# Patient Record
Sex: Female | Born: 1980 | Race: White | Hispanic: No | Marital: Married | Smoking: Former smoker
Health system: Southern US, Community
[De-identification: ages and names within clinical notes are randomized; demographics above are authoritative.]

## PROBLEM LIST (undated history)

## (undated) DIAGNOSIS — R569 Unspecified convulsions: Secondary | ICD-10-CM

## (undated) DIAGNOSIS — D352 Benign neoplasm of pituitary gland: Secondary | ICD-10-CM

## (undated) DIAGNOSIS — Z8669 Personal history of other diseases of the nervous system and sense organs: Secondary | ICD-10-CM

## (undated) DIAGNOSIS — G43909 Migraine, unspecified, not intractable, without status migrainosus: Secondary | ICD-10-CM

## (undated) DIAGNOSIS — K219 Gastro-esophageal reflux disease without esophagitis: Secondary | ICD-10-CM

## (undated) DIAGNOSIS — Z8619 Personal history of other infectious and parasitic diseases: Secondary | ICD-10-CM

## (undated) DIAGNOSIS — J45909 Unspecified asthma, uncomplicated: Secondary | ICD-10-CM

## (undated) DIAGNOSIS — E119 Type 2 diabetes mellitus without complications: Secondary | ICD-10-CM

## (undated) DIAGNOSIS — G90A Postural orthostatic tachycardia syndrome (POTS): Secondary | ICD-10-CM

## (undated) DIAGNOSIS — F32A Depression, unspecified: Secondary | ICD-10-CM

## (undated) DIAGNOSIS — F329 Major depressive disorder, single episode, unspecified: Secondary | ICD-10-CM

## (undated) DIAGNOSIS — F419 Anxiety disorder, unspecified: Secondary | ICD-10-CM

## (undated) HISTORY — DX: Major depressive disorder, single episode, unspecified: F32.9

## (undated) HISTORY — DX: Benign neoplasm of pituitary gland: D35.2

## (undated) HISTORY — PX: OTHER SURGICAL HISTORY: SHX169

## (undated) HISTORY — DX: Personal history of other infectious and parasitic diseases: Z86.19

## (undated) HISTORY — DX: Depression, unspecified: F32.A

## (undated) HISTORY — DX: Type 2 diabetes mellitus without complications: E11.9

## (undated) HISTORY — DX: Anxiety disorder, unspecified: F41.9

## (undated) HISTORY — DX: Unspecified convulsions: R56.9

## (undated) HISTORY — DX: Gastro-esophageal reflux disease without esophagitis: K21.9

## (undated) HISTORY — DX: Unspecified asthma, uncomplicated: J45.909

## (undated) HISTORY — PX: APPENDECTOMY: SHX54

## (undated) HISTORY — DX: Personal history of other diseases of the nervous system and sense organs: Z86.69

## (undated) HISTORY — PX: CHOLECYSTECTOMY: SHX55

---

## 2014-05-20 LAB — HM PAP SMEAR: HM Pap smear: NORMAL

## 2016-01-27 ENCOUNTER — Telehealth: Payer: Self-pay

## 2016-01-27 NOTE — Telephone Encounter (Signed)
Pre visit call completed 

## 2016-01-28 ENCOUNTER — Encounter: Payer: Self-pay | Admitting: Family Medicine

## 2016-01-28 ENCOUNTER — Ambulatory Visit (INDEPENDENT_AMBULATORY_CARE_PROVIDER_SITE_OTHER): Admitting: Family Medicine

## 2016-01-28 VITALS — BP 102/74 | HR 90 | Temp 97.9°F | Ht 67.0 in | Wt 257.6 lb

## 2016-01-28 DIAGNOSIS — G43109 Migraine with aura, not intractable, without status migrainosus: Secondary | ICD-10-CM

## 2016-01-28 DIAGNOSIS — Z86018 Personal history of other benign neoplasm: Secondary | ICD-10-CM

## 2016-01-28 LAB — COMPREHENSIVE METABOLIC PANEL
ALT: 26 U/L (ref 0–35)
AST: 22 U/L (ref 0–37)
Albumin: 4.4 g/dL (ref 3.5–5.2)
Alkaline Phosphatase: 58 U/L (ref 39–117)
BUN: 11 mg/dL (ref 6–23)
CALCIUM: 9.8 mg/dL (ref 8.4–10.5)
CHLORIDE: 102 meq/L (ref 96–112)
CO2: 28 meq/L (ref 19–32)
Creatinine, Ser: 0.81 mg/dL (ref 0.40–1.20)
GFR: 85.02 mL/min (ref >60.00)
Glucose, Bld: 82 mg/dL (ref 70–99)
Potassium: 4 mEq/L (ref 3.5–5.1)
Sodium: 138 mEq/L (ref 135–145)
Total Bilirubin: 0.5 mg/dL (ref 0.2–1.2)
Total Protein: 7.6 g/dL (ref 6.0–8.3)

## 2016-01-28 LAB — LIPID PANEL
Cholesterol: 213 mg/dL — ABNORMAL HIGH (ref 0–200)
HDL: 31 mg/dL — AB (ref >39.00)
Total CHOL/HDL Ratio: 7

## 2016-01-28 LAB — LDL CHOLESTEROL, DIRECT: LDL DIRECT: 93 mg/dL

## 2016-01-28 NOTE — Patient Instructions (Signed)
Take magnesium every day for your headaches. You can use 400-500 mg daily. This is available over the counter.

## 2016-01-28 NOTE — Progress Notes (Signed)
Chief Complaint  Patient presents with  . 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  The patient was previously seen at an office in [place].   Migraines-  On L side, worse over the past 6 mo. Aura of numbness/tingling on L side of face and pain behind eye. She is sensitive to light and sound. She used to be on Topamax. She cannot take any triptans due to parental adverse effects and cannot use NSAIDs due to side effects. She has never tried/failed any other prophylactic medications. Her HA's last for 2-3 days and she has around 10-12 per month.  Hx of prolactinoma, removed 8 years ago, and elevated prolactin levels. She used to follow with Endocrinology in office she used to be at in [place]. Would like to see one here. She used to be on Cabergoline.   Allergies  Allergen Reactions  . Aspirin   . Ibuprofen   . Iodine   . Nsaids Nausea And Vomiting  . Sulfa Antibiotics   . Tape Rash    Blisters     Past Medical History:  Diagnosis Date  . Asthma   . Depression   . GERD (gastroesophageal reflux disease)   . History of chicken pox   . History of migraine headaches   . Seizures ([place])    Past Surgical History:  Procedure Laterality Date  . APPENDECTOMY    . CHOLECYSTECTOMY    . Pituiary Tumor     Tumor Removal Pituitary   Social History   Social History  . Marital status: Married   Social History Main Topics  . Smoking status: Former Research scientist (life sciences)  . Smokeless tobacco: Never Used  . Alcohol use No  . Drug use: No   Family History  Problem Relation Age of Onset  . Diabetes Mother   . Migraines Mother   . Hyperlipidemia Mother   . Bipolar disorder Mother   . Diabetes Father   . Colon cancer Father   . Cancer Father     colon  . Hyperlipidemia  Father   . Hypertension Father    Takes no medications routinely.  Patient's last menstrual period was 01/28/2016.  ROS Neuro: +Hx of migraines  Eyes: Denies vision changes   OBJECTIVE: BP 102/74 (BP Location: Right Arm, Patient Position: Sitting, Cuff Size: Large)   Pulse 90   Temp 97.9 F (36.6 C) (Oral)   Ht 5\' 7"  (1.702 m)   Wt 257 lb 9.6 oz (116.8 kg)   LMP 01/28/2016   SpO2 96%   BMI 40.35 kg/m   Constitutional: -  VS reviewed -  Well developed, well nourished, appears stated age -  No apparent distress  Psychiatric: -  Oriented to person, place, and time -  Memory intact -  Affect and mood normal -  Fluent conversation, good eye contact -  Judgment and insight age appropriate  Eye: -  Conjunctivae clear, no discharge -  Pupils symmetric, round, reactive to light  ENMT: -  Ears are patent b/l without erythema or discharge. TM's are shiny and clear b/l without evidence of effusion or infection. -  Oral mucosa without lesions, tongue and uvula midline    Tonsils not enlarged, no erythema, no exudate, trachea midline    Pharynx moist, no lesions,  no erythema  Neck: -  No gross swelling, no palpable masses -  Thyroid midline, not enlarged, mobile, no palpable masses  Cardiovascular: -  RRR, no murmurs -  No LE edema  Respiratory: -  Normal respiratory effort, no accessory muscle use, no retraction -  Breath sounds equal, no wheezes, no ronchi, no crackles  Neurological:  -  CN II - XII grossly intact -  Biceps reflex 1/4 bl -  Patellar reflex 3/4 bl -  Calcaneal reflex 1/4 b/l -  No clonus -  Sensation grossly intact to light touch, equal bilaterally  Musculoskeletal: -  No clubbing, no cyanosis -  Gait normal  Skin: -  No significant lesion on inspection -  Warm and dry to palpation   ASSESSMENT/PLAN: Migraine with aura and without status migrainosus, not intractable - Plan: Ambulatory referral to Endocrinology  History of prolactinoma - Plan:  Prolactin  Morbid obesity ([place]) - Plan: Comprehensive metabolic panel, Lipid panel  Patient instructed to sign release of records form from her previous PCP. Other orders as above. Will refer to Endo. Will start Mg in the patient 400-500 mg daily as she is interested in having another child. Patient should return 4 weeks to recheck HA's- propranolol is another alternative to increasing dose of magnesium. The patient voiced understanding and agreement to the plan.   [place], DO 01/28/16  2:11 PM

## 2016-01-28 NOTE — Progress Notes (Signed)
Pre visit review using our clinic review tool, if applicable. No additional management support is needed unless otherwise documented below in the visit note. 

## 2016-01-29 ENCOUNTER — Other Ambulatory Visit: Payer: Self-pay | Admitting: Family Medicine

## 2016-01-29 DIAGNOSIS — E781 Pure hyperglyceridemia: Secondary | ICD-10-CM

## 2016-01-29 LAB — PROLACTIN: PROLACTIN: 114.7 ng/mL — AB

## 2016-02-08 ENCOUNTER — Other Ambulatory Visit

## 2016-02-28 NOTE — Progress Notes (Addendum)
Subjective:    Patient ID: Amber [name], female    DOB: [birth year], 35 y.o.   MRN: [id number]  HPI Pt is referred by Dr Nani Ravens, for hyperprolactinemia.  Pt says this was dx'ed in 1996, in eval of primary amenorrhea.  She had resection of prolactinoma in 2009, in [place], [place].  She says prolactin level normalized, and menses resumed.  The hyperprolactinemia recurred in 2015, so she took cabergoline for a few months.  She says last MRI (prior to cabergoline) in 2015 showed no tumor.  She stopped cabergoline, due to insurance. She is G0, but would like to start a pregnancy.  She took oral contraceptives x 1 year, 2010-2011. She denies the following: excessive exercise, opiates, antipsychotics, brain XRT, cirrhosis,  thyroid dz, seizures, h/o liver dz, PCO, hirsutism, renal dz, zoster, brain injury, or chest wall injury.  She has intermittent severe headache, worse x 9 months, at the left frontal area, and assoc n/v. She did not tolerate parlodel (N/V).   Past Medical History:  Diagnosis Date  . Asthma   . Depression   . GERD (gastroesophageal reflux disease)   . History of chicken pox   . History of migraine headaches   . Seizures ([place])     Past Surgical History:  Procedure Laterality Date  . APPENDECTOMY    . CHOLECYSTECTOMY    . Pituiary Tumor     Tumor Removal Pituitary    Social History   Social History  . Marital status: Married    Spouse name: N/A  . Number of children: N/A  . Years of education: N/A   Occupational History  . Not on file.   Social History Main Topics  . Smoking status: Former Research scientist (life sciences)  . Smokeless tobacco: Never Used  . Alcohol use No  . Drug use: No  . Sexual activity: Not on file   Other Topics Concern  . Not on file   Social History Narrative  . No narrative on file    Current Outpatient Prescriptions on File Prior to Visit  Medication Sig Dispense Refill  . Magnesium 400 MG TABS Take 400 mg by mouth daily.     No current  facility-administered medications on file prior to visit.     Allergies  Allergen Reactions  . Aspirin   . Ibuprofen   . Iodine   . Nsaids Nausea And Vomiting  . Sulfa Antibiotics   . Tape Rash    Blisters     Family History  Problem Relation Age of Onset  . Diabetes Mother   . Migraines Mother   . Hyperlipidemia Mother   . Bipolar disorder Mother   . Diabetes Father   . Colon cancer Father   . Cancer Father     colon  . Hyperlipidemia Father   . Hypertension Father   . Other Neg Hx     pituitary disorder    BP 124/86   Pulse 100   Ht 5\' 7"  (1.702 m)   Wt 263 lb (119.3 kg)   SpO2 95%   BMI 41.19 kg/m    Review of Systems denies polyuria, loss of smell, syncope, rash, visual loss, easy bruising, and change in facial appearance.  She has galactorrhea, weight gain, rhinorrhea, and depression. She has reg menses x the past 4 months.      Objective:   Physical Exam VS: see vs page GEN: no distress HEAD: head: no deformity eyes: no periorbital swelling, no proptosis external nose and ears are normal  mouth: no lesion seen NECK: supple, thyroid is not enlarged CHEST WALL: no deformity LUNGS: clear to auscultation CV: reg rate and rhythm, no murmur ABD: abdomen is soft, nontender.  no hepatosplenomegaly.  not distended.  no hernia MUSCULOSKELETAL: muscle bulk and strength are grossly normal.  no obvious joint swelling.  gait is normal and steady EXTEMITIES: no edema PULSES: no carotid bruit NEURO:  cn 2-12 grossly intact.   readily moves all 4's.  sensation is intact to touch on all 4's.  SKIN:  Normal texture and temperature.  No rash or suspicious lesion is visible.   NODES:  None palpable at the neck PSYCH: alert, well-oriented.  Does not appear anxious nor depressed.  Prolactin=115 Lab Results  Component Value Date   TSH 0.66 03/02/2016      Assessment & Plan:  Hyperprolactinemia, recurrent.   Headache, ? related to the above.   Patient is advised  the following: Patient Instructions  blood tests are requested for you today.  We'll let you know about the results.  Based on the results, we'll likely recheck the MRI.  Then most likely, we'll resume the carbergoline, and you'll be ready to try for the pregnancy.   Please sign a release of info from [facility].

## 2016-03-02 ENCOUNTER — Encounter: Payer: Self-pay | Admitting: Endocrinology

## 2016-03-02 ENCOUNTER — Ambulatory Visit (INDEPENDENT_AMBULATORY_CARE_PROVIDER_SITE_OTHER): Admitting: Endocrinology

## 2016-03-02 DIAGNOSIS — E221 Hyperprolactinemia: Secondary | ICD-10-CM | POA: Diagnosis not present

## 2016-03-02 DIAGNOSIS — D352 Benign neoplasm of pituitary gland: Secondary | ICD-10-CM | POA: Diagnosis not present

## 2016-03-02 LAB — T4, FREE: FREE T4: 0.7 ng/dL (ref 0.60–1.60)

## 2016-03-02 LAB — BASIC METABOLIC PANEL
BUN: 10 mg/dL (ref 6–23)
CHLORIDE: 104 meq/L (ref 96–112)
CO2: 27 mEq/L (ref 19–32)
Calcium: 9.4 mg/dL (ref 8.4–10.5)
Creatinine, Ser: 0.74 mg/dL (ref 0.40–1.20)
GFR: 94.32 mL/min (ref >60.00)
Glucose, Bld: 105 mg/dL — ABNORMAL HIGH (ref 70–99)
POTASSIUM: 3.8 meq/L (ref 3.5–5.1)
SODIUM: 138 meq/L (ref 135–145)

## 2016-03-02 LAB — TSH: TSH: 0.66 u[IU]/mL (ref 0.35–4.50)

## 2016-03-02 NOTE — Patient Instructions (Addendum)
blood tests are requested for you today.  We'll let you know about the results.  Based on the results, we'll likely recheck the MRI.  Then most likely, we'll resume the carbergoline, and you'll be ready to try for the pregnancy.   Please sign a release of info from [place].

## 2016-03-03 ENCOUNTER — Telehealth: Payer: Self-pay

## 2016-03-03 NOTE — Telephone Encounter (Signed)
-----   Message from Renato Shin, MD sent at 03/02/2016  6:42 PM EST ----- please call patient: Normal. I ordered MRI.  you will receive a phone call, about a day and time for an appointment

## 2016-03-03 NOTE — Telephone Encounter (Signed)
Attempted to reach the patient. Patient was not available and did not have a working voicemail. Will try again at a later time.  

## 2016-03-05 DIAGNOSIS — D352 Benign neoplasm of pituitary gland: Secondary | ICD-10-CM | POA: Insufficient documentation

## 2016-03-22 ENCOUNTER — Other Ambulatory Visit

## 2016-03-30 ENCOUNTER — Other Ambulatory Visit

## 2016-05-09 ENCOUNTER — Telehealth: Payer: Self-pay | Admitting: Family Medicine

## 2016-05-09 NOTE — Telephone Encounter (Addendum)
Called patient to follow up. Pt states that symptoms started on 04/30/16.  Started with a clustered headache that did not go away after taking medication.  Instead, pt started to experience numbness and tingling to the left side of her face and pain on the left side of her body (left knee and hip).  She also feels really tired and has chest pain.  Pt states she does not want to go to the ER.  States she's not dying and would prefer to wait to see her provider.    Discussed the same with Dr. Nani Ravens.  He advised that patient go to the ER, but if she choose to wait a day or two that would be fine.    Pt scheduled to see Dr. Nani Ravens on Wednesday at 2:15pm.  Pt was strongly advised if symptoms worsen or new symptoms developed to go to the ER.  Pt stated understanding and agreed to comply.

## 2016-05-09 NOTE — Telephone Encounter (Signed)
[facility] Day - Client [facility] Patient Name: Amber [name] DOB: June 14, 1980 Initial Comment Caller states that she has been getting bad migraine and numbness, and tingling in her left side of her face and pain on her left side as well and she feels heavy, and is extremely lethargic. She has pain in her left side of her body. She is extremely weak as well. Nurse Assessment Nurse: Markus Daft, RN, Windy Date/Time Eilene Ghazi Time): 05/09/2016 2:37:28 PM Confirm and document reason for call. If symptomatic, describe symptoms. ---Caller states that she has been getting severe migraine and numbness, and tingling in her left side of her face and pain on her left side as well and she feels heavy, and is extremely tired doing simple tasks, and weakness all over. She has pain in her left side of her body. Does the patient have any new or worsening symptoms? ---Yes Will a triage be completed? ---Yes Related visit to physician within the last 2 weeks? ---No Does the PT have any chronic conditions? (i.e. diabetes, asthma, etc.) ---Yes List chronic conditions. ---migraines, pituitary tumor - needs MRI to see if has reoccurred Is the patient pregnant or possibly pregnant? (Ask all females between the ages of 20-55) ---No Is this a behavioral health or substance abuse call? ---No Guidelines Guideline Title Affirmed Question Affirmed Notes Headache [1] SEVERE headache (e.g., excruciating) AND [2] not improved after 2 hours of pain medicine Final Disposition User See Physician within 4 Hours (or PCP triage) Markus Daft, RN, Windy Comments Numbness in her face for last week. No available appts for when pt can get to office today. She plans to go to the ER in same buidling. Referrals REFERRED TO PCP OFFICE [facility] - ED Disagree/Comply: Comply

## 2016-05-11 ENCOUNTER — Encounter: Payer: Self-pay | Admitting: Family Medicine

## 2016-05-11 ENCOUNTER — Ambulatory Visit (INDEPENDENT_AMBULATORY_CARE_PROVIDER_SITE_OTHER): Admitting: Family Medicine

## 2016-05-11 DIAGNOSIS — G43909 Migraine, unspecified, not intractable, without status migrainosus: Secondary | ICD-10-CM | POA: Insufficient documentation

## 2016-05-11 DIAGNOSIS — G43809 Other migraine, not intractable, without status migrainosus: Secondary | ICD-10-CM

## 2016-05-11 MED ORDER — PROPRANOLOL HCL 40 MG PO TABS: 40.0000 mg | ORAL_TABLET | Freq: Two times a day (BID) | ORAL | 1 refills | Status: DC

## 2016-05-11 NOTE — Patient Instructions (Addendum)
Aim to do some physical exertion for 150 minutes per week. This is typically divided into 5 days per week, 30 minutes per day. The activity should be enough to get your heart rate up. Anything is better than nothing if you have time constraints.  Keep an eye on your diet and see if it affects your headaches.  Stay on the magnesium for now.  After 4 weeks, you can start taking 3 tabs of the propranolol daily, increased from twice daily.

## 2016-05-11 NOTE — Progress Notes (Signed)
Pre visit review using our clinic review tool, if applicable. No additional management support is needed unless otherwise documented below in the visit note. 

## 2016-05-11 NOTE — Progress Notes (Signed)
Chief Complaint  Patient presents with  . Numbness    Pt reports LT sided body pain with low back pain as well with numbness and tingling x2 weeks     Amber [name] is a 36 y.o. female here for evaluation of headache.  Duration: 11 days Palliation: Nothing Provocation: Doing things Severity: 8 Quality: pressure Associated symptoms: sensitivity to light, intermittent nausea, left sided weakness and pain Denies: vomiting, diplopia, vertigo, tinnitus, ataxia Currently with headache? Yes Failed therapies: Has been on Mg 400 mg daily, no benefit Unable to take triptans due to familial adverse effect. She cannot take NSAIDs due to hx of GI bleed on them.  Past Medical History:  Diagnosis Date  . Asthma   . Depression   . GERD (gastroesophageal reflux disease)   . History of chicken pox   . History of migraine headaches   . Seizures ([place])    Family History  Problem Relation Age of Onset  . Diabetes Mother   . Migraines Mother   . Hyperlipidemia Mother   . Bipolar disorder Mother   . Diabetes Father   . Colon cancer Father   . Cancer Father     colon  . Hyperlipidemia Father   . Hypertension Father   . Other Neg Hx     pituitary disorder   Current Meds  Medication Sig  . Magnesium 400 MG TABS Take 400 mg by mouth daily.    BP 113/86 (BP Location: Left Arm, Patient Position: Sitting, Cuff Size: Large)   Pulse (!) 106   Temp 98.3 F (36.8 C) (Oral)   Ht 5\' 6"  (1.676 m)   Wt 269 lb 12.8 oz (122.4 kg)   LMP 03/21/2016   SpO2 97%   BMI 43.55 kg/m  General: awake, alert, appearing stated age Eyes: PERRLA, EOMi Heart: Reg rhythm, tachycardic, no murmurs, no bruits Lungs: CTAB, no accessory muscle use Neuro: CN 2-12 intact, no cerebellar signs, DTR's equal and symmetry, no clonus MSK: 5/5 strength throughout UE's b/l, normal gait, 4/5 strength L hip flexion and knee extension/flexion, 5/5 strength in RLE Psych: Age appropriate judgment and insight, mood and affect  normal  Other migraine without status migrainosus, not intractable - Plan: propranolol (INDERAL) 40 MG tablet  Orders as above. Start propranolol.  Symptoms have been going on >24 hours. MRI ordered. This is likely due to functional neurologic deficit, but am interested to see what MRI shows. Counseled on diet and exercise. Stay on Mg. Follow up in 6-8 weeks. If no improvement, will increase dose and refer to Neurology for 2nd opinion. The patient voiced understanding and agreement to the plan.  [place], DO 2:42 PM 05/11/16

## 2016-05-18 ENCOUNTER — Telehealth: Payer: Self-pay | Admitting: Family Medicine

## 2016-05-18 DIAGNOSIS — G43809 Other migraine, not intractable, without status migrainosus: Secondary | ICD-10-CM

## 2016-05-18 NOTE — Telephone Encounter (Signed)
Caller name: Relationship to patient: Self Can be reached: [phone]  Pharmacy:  [id number] [place], [place] [address], [address] [phone] (Phone) [phone] (Fax)     Reason for call: Patient states that Rx for propranolol (INDERAL) 40 MG tablet was called into the wrong pharmacy. [facility]

## 2016-05-23 ENCOUNTER — Encounter: Payer: Self-pay | Admitting: Family Medicine

## 2016-05-24 MED ORDER — PROPRANOLOL HCL 40 MG PO TABS: 40.0000 mg | ORAL_TABLET | Freq: Two times a day (BID) | ORAL | 1 refills | Status: DC

## 2016-05-24 NOTE — Addendum Note (Signed)
Addended by: Sharon Seller B on: 05/24/2016 09:56 AM   Modules accepted: Orders

## 2016-05-24 NOTE — Telephone Encounter (Signed)
Refill corrected. Attempted to contact the patient no answer/no voice mail set up.

## 2016-05-25 ENCOUNTER — Ambulatory Visit
Admission: RE | Admit: 2016-05-25 | Discharge: 2016-05-25 | Disposition: A | Discharge status: Home / Self Care | Source: Ambulatory Visit | Attending: Endocrinology | Admitting: Endocrinology

## 2016-05-25 DIAGNOSIS — E221 Hyperprolactinemia: Secondary | ICD-10-CM

## 2016-05-25 MED ORDER — GADOBENATE DIMEGLUMINE 529 MG/ML IV SOLN
10.0000 mL | Freq: Once | INTRAVENOUS | Status: AC | PRN
  Administered 2016-05-25: 10 mL via INTRAVENOUS

## 2016-05-26 ENCOUNTER — Telehealth: Payer: Self-pay | Admitting: Family Medicine

## 2016-05-26 ENCOUNTER — Telehealth: Payer: Self-pay | Admitting: Endocrinology

## 2016-05-26 ENCOUNTER — Encounter: Payer: Self-pay | Admitting: Endocrinology

## 2016-05-26 ENCOUNTER — Encounter: Payer: Self-pay | Admitting: Family Medicine

## 2016-05-26 ENCOUNTER — Ambulatory Visit (INDEPENDENT_AMBULATORY_CARE_PROVIDER_SITE_OTHER): Admitting: Family Medicine

## 2016-05-26 VITALS — BP 100/72 | HR 76 | Temp 97.5°F | Ht 66.0 in | Wt 270.0 lb

## 2016-05-26 DIAGNOSIS — G43809 Other migraine, not intractable, without status migrainosus: Secondary | ICD-10-CM

## 2016-05-26 MED ORDER — ONDANSETRON HCL 4 MG PO TABS: 4.0000 mg | ORAL_TABLET | Freq: Three times a day (TID) | ORAL | 0 refills | Status: DC | PRN

## 2016-05-26 MED ORDER — DIHYDROERGOTAMINE MESYLATE 4 MG/ML NA SOLN: 1.0000 | NASAL | 12 refills | Status: DC | PRN

## 2016-05-26 MED ORDER — CABERGOLINE 0.5 MG PO TABS: 0.2500 mg | ORAL_TABLET | ORAL | 2 refills | Status: DC

## 2016-05-26 NOTE — Telephone Encounter (Signed)
Relation to pt: self  Call back [phone] Pharmacy: [id number] [place], [place] [address], [address] AT [facility] [address] [phone] (Phone) [phone] (Fax)     Reason for call:  Patient states dihydroergotamine (MIGRANAL) 4 MG/ML nasal spray is $700 requesting alternate or pain medication, please advise

## 2016-05-26 NOTE — Patient Instructions (Addendum)
Do not get pregnant while taking the nasal spray medicine. Can take every 15 min, up to 4 times in 1 hour.  The biggest side effect is nausea. Anti-nausea medicine has been called in just in case.

## 2016-05-26 NOTE — Telephone Encounter (Signed)
I contacted the patient and advised Rx has been submitted for the cabergoline. Patient voiced understanding.

## 2016-05-26 NOTE — Progress Notes (Signed)
Chief Complaint  Patient presents with  . Migraine    x 4 weeks    Amber [name] is a 36 y.o. female here for evaluation of headache.  Duration: 4 weeks Unable to take NSAIDs due to hx of GI bleeds.  She has a strong fam hx of intolerance to triptans and does not wish to take these.  She has never been on an ergotamine before. R sided headache, some intermittent nausea, sensitivity to light is improved.  She was recommended to take Tylenol and start on propranolol. States propranolol may be slightly helpful, but she needs something to break the headache.  No new symptoms such as vision changes, weakness, balance issues, trouble swallowing.  Past Medical History:  Diagnosis Date  . Asthma   . Depression   . GERD (gastroesophageal reflux disease)   . History of chicken pox   . History of migraine headaches   . Seizures ([place])    Family History  Problem Relation Age of Onset  . Diabetes Mother   . Migraines Mother   . Hyperlipidemia Mother   . Bipolar disorder Mother   . Diabetes Father   . Colon cancer Father   . Cancer Father     colon  . Hyperlipidemia Father   . Hypertension Father   . Other Neg Hx     pituitary disorder   Current Meds  Medication Sig  . acetaminophen (TYLENOL) 500 MG tablet Take 500 mg by mouth as needed.  . butalbital-acetaminophen-caffeine (FIORICET, ESGIC) 50-325-40 MG tablet Take 1 tablet by mouth every 4 (four) hours as needed. for pain  . diphenhydrAMINE (BENADRYL) 50 MG tablet Take 50 mg by mouth at bedtime as needed for itching.  . Magnesium 400 MG TABS Take 400 mg by mouth daily.  . propranolol (INDERAL) 40 MG tablet Take 1 tablet (40 mg total) by mouth 3 (three) times daily.  . [DISCONTINUED] propranolol (INDERAL) 40 MG tablet Take 1 tablet (40 mg total) by mouth 2 (two) times daily. (Patient taking differently: Take 40 mg by mouth 3 (three) times daily. )    BP 100/72 (BP Location: Left Arm, Patient Position: Sitting, Cuff Size:  Large)   Pulse 76   Temp 97.5 F (36.4 C) (Oral)   Ht 5\' 6"  (1.676 m)   Wt 270 lb (122.5 kg)   LMP 03/21/2016   SpO2 95%   BMI 43.58 kg/m  General: awake, alert, appearing stated age Eyes: PERRLA, EOMi Heart: RRR, no murmurs, no bruits Lungs: CTAB, no accessory muscle use Neuro: CN 2-12 intact, no cerebellar signs, DTR's equal and symmetry, no clonus MSK: 5/5 strength throughout, normal gait, no TTP over posterior cervical triangle or paraspinal cervical musculature, no TMJ TTP or clicking Psych: Age appropriate judgment and insight, mood and affect normal  Other migraine without status migrainosus, not intractable - Plan: dihydroergotamine (MIGRANAL) 4 MG/ML nasal spray, ondansetron (ZOFRAN) 4 MG tablet, Ambulatory referral to Neurology, propranolol (INDERAL) 40 MG tablet  Orders as above. Does not sound like IIH.  OK to increase propranolol to TID from BID. Will try Migranal as she is intolerant to triptans and NSAIDs. Will also refer to Neuro for 2nd opinion. Encouraged not to get pregnant on nasal spray. Zofran called in should she start experiencing nausea. Reassurance that MRI did not show anything sinister other than pituitary issue being followed by Endo. Follow up prn. The patient voiced understanding and agreement to the plan.  Crosby Oyster [place], DO 2:36 PM 05/26/16

## 2016-05-26 NOTE — Telephone Encounter (Signed)
I have sent a prescription to your pharmacy. Please come back for a follow-up appointment in 6 weeks

## 2016-05-26 NOTE — Telephone Encounter (Signed)
Pt called in and said that she would like to start the medication that Dr. Loanne Drilling asked her about through [facility].  She wants to make [facility] [address]

## 2016-05-26 NOTE — Progress Notes (Signed)
Pre visit review using our clinic review tool, if applicable. No additional management support is needed unless otherwise documented below in the visit note. 

## 2016-05-27 ENCOUNTER — Encounter: Payer: Self-pay | Admitting: Family Medicine

## 2016-05-27 MED ORDER — METOCLOPRAMIDE HCL 10 MG PO TABS: ORAL_TABLET | ORAL | 0 refills | Status: DC

## 2016-05-27 NOTE — Telephone Encounter (Signed)
OK, that's pretty expensive, taken off of med list. No need for Zofran either. Let's try some Reglan. Called in. Take 1 tab, repeat in 1 hour if no improvement. Do not take more than 2 tabs in 1 day. TY.

## 2016-05-30 MED ORDER — DEXAMETHASONE 4 MG PO TABS: ORAL_TABLET | ORAL | 0 refills | Status: DC

## 2016-05-30 NOTE — Telephone Encounter (Signed)
Called pharmacy on (05/27/16) and spoke with Joe and informed him that Dr. Nani Ravens would like to try Reglan for the pt.  Per [facility] has inaction with the Cabergoline.  Informed him that I will inform Dr. Nani Ravens of this.//AB/CMA

## 2016-05-30 NOTE — Telephone Encounter (Signed)
Called and spoke with Joe at the pharmacy and informed him that Dr. Nani Ravens would like to try Dexamethasone 4mg  daily for 3 days.  Prescription was given verbally to Joe at the pharmacy. Pharmacy will contact the pt.//AB/CMA

## 2016-05-30 NOTE — Telephone Encounter (Signed)
Called and [facility] @ 5:32pm @ [phone]) informing the pt of update regarding change in medication.//AB/CMA

## 2016-06-02 ENCOUNTER — Telehealth: Payer: Self-pay | Admitting: Family Medicine

## 2016-06-02 NOTE — Telephone Encounter (Signed)
Called pt back- she increased her propranolol from BID to TID about one week ago.  She did ok for the first few days but over the last 2-3 days she has started to feel lightheaded and tired.  She checked her BP a few times today and it was running 80s/50s.   Her normal BP is on the low side normally at approx 105/70.  She has been drinking water and eating more salty foods, and is feeling better.  Advised her that she should stop the propranolol now and I will ask Dr. Nani Ravens to give her a call tomorrow.  Also explained to her that Dr. Nani Ravens did not get her message from team care and was not aware that she called, which is why he did not call her back earlier today.   Also advised her that the safest and preferred plan of care would be to have her go to the ER so they can monitor and raise her BP with IV fluids as needed.  Advised her that if she chooses not to do this I would encourage her to drink plenty of fluids including some sports drink, and to monitor her condition carefully.  She expresses understanding

## 2016-06-02 NOTE — Telephone Encounter (Signed)
Patient Name: Amber [name]  DOB: 07-22-1980    Initial Comment Caller says, she started a new Rx for migraines, today low blood pressure 84/56 , feeling dizzy    Nurse Assessment  Nurse: Raphael Gibney, RN, Vanita Ingles Date/Time (Eastern Time): 06/02/2016 11:40:29 AM  Confirm and document reason for call. If symptomatic, describe symptoms. ---caller states she has recently started on new medication propanolol for migraines. Has increased dosage to TID. BP 84/56 and she is having some dizziness. Normal BP around 105/70  Does the patient have any new or worsening symptoms? ---Yes  Will a triage be completed? ---Yes  Related visit to physician within the last 2 weeks? ---No  Does the PT have any chronic conditions? (i.e. diabetes, asthma, etc.) ---Yes  List chronic conditions. ---migraines; pituitary tumor  Is the patient pregnant or possibly pregnant? (Ask all females between the ages of 7-55) ---No  Is this a behavioral health or substance abuse call? ---No     Guidelines    Guideline Title Affirmed Question Affirmed Notes  Low Blood Pressure [8] Systolic BP < 90 AND [3] NOT dizzy, lightheaded or weak    Final Disposition User   See Physician within 4 Hours (or PCP triage) Raphael Gibney, RN, Vera    Comments  pt states she has a plumber coming this afternoon and does not want to make an appt. She would like call back from the doctor as to whether to continue her propanolol dosage TID or if she should decrease dosage down to BID. Please call pt back regarding medication.  Pt states her dizziness is better since she has had some fluids and has been up ambulating.   Referrals  GO TO FACILITY REFUSED   Disagree/Comply: Disagree  Disagree/Comply Reason: Disagree with instructions

## 2016-06-02 NOTE — Telephone Encounter (Signed)
Patient called back stating that she has not received a call. CMA Raiford Noble) spoke with Dr. Lorelei Pont and called patient back

## 2016-06-02 NOTE — Telephone Encounter (Signed)
Patient called stating she is experiencing low BP's. Last BP was 84/56 about 10 minutes ago. Transferred to Team health

## 2016-06-03 NOTE — Telephone Encounter (Signed)
I was able to get ahold of the patient. She states she is still not feeling the best, but her BP is up into the 90's/60's. She has stopped taking the propranolol. She did not receive note of the decadron, but will pick this up. She has no other questions at this time. Hopefully she can get into Neuro soon. All questions answered.   Can we make sure she follows up with the call to [facility] please? TY.

## 2016-06-03 NOTE — Telephone Encounter (Signed)
Reviewed message. I do not believe I received the initial team health message. Called patient and LVM, apologized for misunderstanding, requested she contact office giving update of her status and to let us know when a good time to contact her would be.

## 2016-06-05 ENCOUNTER — Encounter: Payer: Self-pay | Admitting: Endocrinology

## 2016-06-06 ENCOUNTER — Other Ambulatory Visit: Payer: Self-pay | Admitting: Endocrinology

## 2016-06-06 DIAGNOSIS — E221 Hyperprolactinemia: Secondary | ICD-10-CM

## 2016-06-06 NOTE — Telephone Encounter (Signed)
Called and spoke with the pt regarding follow-up with the call to [facility].   Pt stated that she has an appointment scheduled for (06/28/16).  Asked the pt if she has received a call regarding the prescription for the Decadron.  Pt stated that they call her today and she will be going to pick it up.//AB/CMA

## 2016-06-15 ENCOUNTER — Encounter: Payer: Self-pay | Admitting: Family Medicine

## 2016-06-16 ENCOUNTER — Telehealth: Payer: Self-pay | Admitting: *Deleted

## 2016-06-16 MED ORDER — BUTALBITAL-APAP-CAFFEINE 50-325-40 MG PO TABS: 1.0000 | ORAL_TABLET | ORAL | 0 refills | Status: DC | PRN

## 2016-06-16 MED ORDER — PROPRANOLOL HCL 40 MG PO TABS: 40.0000 mg | ORAL_TABLET | Freq: Two times a day (BID) | ORAL | 0 refills | Status: DC

## 2016-06-16 NOTE — Telephone Encounter (Signed)
Called and spoke with the pt and informed her of the MyChart message from Dr. Nani Ravens.  Pt verbalized understanding and stated that her BP has been (90/65) and 106 on the top with migraines.  She said she was taking out of work until she see the Neurologist on (06/28/16).  Pt asked if she could also have a prescription for the Fioricet w/ Codeine.   Informed Dr. Nani Ravens of the message and he agreed to refill the Propranolol and the Fioricet w/o Codeine.  Called the pt back and informed her that per Dr. Nani Ravens we will be refilling the Propranolol and the Fioricet w/o Codeine but not the Fioricet w/ Codeine.  She will have to get the Fioricet w/ Codeine from the Neurologist.  Pt verbalized understanding and agreed and stated that as long as she has something.  Rx's sent to the pharmacy by e-script and fax.  Confirmation received.//AB/CMA

## 2016-06-28 ENCOUNTER — Ambulatory Visit: Admitting: Neurology

## 2016-07-05 ENCOUNTER — Telehealth: Payer: Self-pay

## 2016-07-05 NOTE — Telephone Encounter (Signed)
Called pt and left VM mssg offering sooner new patient appt on Mon 4/23 w/ Dr. Jaynee Eagles @ 8 am. She may call back to schedule.

## 2016-07-13 ENCOUNTER — Ambulatory Visit: Admitting: Neurology

## 2016-09-22 ENCOUNTER — Other Ambulatory Visit: Payer: Self-pay | Admitting: Family Medicine

## 2016-09-22 ENCOUNTER — Encounter: Payer: Self-pay | Admitting: Family Medicine

## 2016-09-22 ENCOUNTER — Encounter: Payer: Self-pay | Admitting: Neurology

## 2016-09-22 DIAGNOSIS — G43009 Migraine without aura, not intractable, without status migrainosus: Secondary | ICD-10-CM

## 2016-09-22 NOTE — Telephone Encounter (Signed)
Please place referral for neurology for migraines. TY.

## 2016-10-13 ENCOUNTER — Other Ambulatory Visit: Payer: Self-pay | Admitting: Family Medicine

## 2016-10-14 NOTE — Telephone Encounter (Signed)
Rx approved and sent to the pharmacy by e-script.//AB/CMA 

## 2016-10-20 ENCOUNTER — Other Ambulatory Visit: Payer: Self-pay | Admitting: Endocrinology

## 2016-10-20 ENCOUNTER — Encounter: Payer: Self-pay | Admitting: Family Medicine

## 2016-10-20 ENCOUNTER — Ambulatory Visit (INDEPENDENT_AMBULATORY_CARE_PROVIDER_SITE_OTHER): Admitting: Family Medicine

## 2016-10-20 VITALS — BP 96/62 | HR 68 | Temp 98.2°F | Ht 66.0 in | Wt 274.2 lb

## 2016-10-20 DIAGNOSIS — J069 Acute upper respiratory infection, unspecified: Secondary | ICD-10-CM | POA: Diagnosis not present

## 2016-10-20 DIAGNOSIS — B9789 Other viral agents as the cause of diseases classified elsewhere: Secondary | ICD-10-CM

## 2016-10-20 DIAGNOSIS — E221 Hyperprolactinemia: Secondary | ICD-10-CM

## 2016-10-20 DIAGNOSIS — L0591 Pilonidal cyst without abscess: Secondary | ICD-10-CM | POA: Diagnosis not present

## 2016-10-20 LAB — PROLACTIN: Prolactin: 54.6 ng/mL — ABNORMAL HIGH

## 2016-10-20 MED ORDER — HYDROCODONE-ACETAMINOPHEN 5-325 MG PO TABS: 1.0000 | ORAL_TABLET | Freq: Four times a day (QID) | ORAL | 0 refills | Status: DC | PRN

## 2016-10-20 MED ORDER — BENZONATATE 100 MG PO CAPS: 100.0000 mg | ORAL_CAPSULE | Freq: Three times a day (TID) | ORAL | 0 refills | Status: DC | PRN

## 2016-10-20 MED ORDER — CABERGOLINE 0.5 MG PO TABS: 0.5000 mg | ORAL_TABLET | ORAL | 2 refills | Status: DC

## 2016-10-20 NOTE — Patient Instructions (Addendum)
Ice/cold pack over area for 10-15 min every 2-3 hours while awake.  Do not drive on pain medicine.  If you do not hear anything about your referral in the next week, call our office and ask for an update.

## 2016-10-20 NOTE — Progress Notes (Signed)
Chief Complaint  Patient presents with  . Hospitalization Follow-up    F/U I&D of Pilonidal Cyst on 7.29.2018.    Subjective: Patient is a 36 y.o. female here for f/u cyst.  The patient was seen in [place] for a cyst just above her backside. It was drained and packed. The packing was removed 2 days later. She is placed on antibiotics. She has a couple days left of clindamycin, 4 times daily. She is tolerating the medicine well. She was told that she needs a follow-up with general surgery. No fevers, spreading redness, foul/purulent drainage. She is still having quite a bit of pain when she sits down.  Duration: 3 days  Associated symptoms: sinus congestion, rhinorrhea, ST, sneezing and cough Denies: sinus pain, itchy watery eyes, ear pain, ear drainage, shortness of breath, myalgia and fevers/rigors Treatment to date: None Sick contacts: Yes- went to nephews 1 yr Bday party, lots of sick kids    ROS: Const: No fevers Skin: As noted in HPI  Family History  Problem Relation Age of Onset  . Diabetes Mother   . Migraines Mother   . Hyperlipidemia Mother   . Bipolar disorder Mother   . Diabetes Father   . Colon cancer Father   . Cancer Father        colon  . Hyperlipidemia Father   . Hypertension Father   . Other Neg Hx        pituitary disorder   Past Medical History:  Diagnosis Date  . Asthma   . Depression   . GERD (gastroesophageal reflux disease)   . History of chicken pox   . History of migraine headaches   . Seizures ([facility])    Allergies  Allergen Reactions  . Aspirin   . Ibuprofen   . Iodine   . Nsaids Nausea And Vomiting  . Sulfa Antibiotics   . Tape Rash    Blisters     Current Outpatient Prescriptions:  .  acetaminophen (TYLENOL) 500 MG tablet, Take 500 mg by mouth as needed., Disp: , Rfl:  .  butalbital-acetaminophen-caffeine (FIORICET, ESGIC) 50-325-40 MG tablet, Take 1 tablet by mouth every 4 (four) hours as needed. for pain, Disp: 30 tablet, Rfl:  0 .  cabergoline (DOSTINEX) 0.5 MG tablet, Take 0.5 tablets (0.25 mg total) by mouth 2 (two) times a week., Disp: 10 tablet, Rfl: 2 .  diphenhydrAMINE (BENADRYL) 50 MG tablet, Take 50 mg by mouth at bedtime as needed for itching., Disp: , Rfl:  .  Magnesium 400 MG TABS, Take 400 mg by mouth daily., Disp: , Rfl:  .  propranolol (INDERAL) 40 MG tablet, TAKE 1 TABLET (40 MG TOTAL) BY MOUTH 2 (TWO) TIMES DAILY., Disp: 180 tablet, Rfl: 0 .  benzonatate (TESSALON) 100 MG capsule, Take 1 capsule (100 mg total) by mouth 3 (three) times daily as needed., Disp: 30 capsule, Rfl: 0 .  HYDROcodone-acetaminophen (NORCO/VICODIN) 5-325 MG tablet, Take 1 tablet by mouth every 6 (six) hours as needed for moderate pain., Disp: 10 tablet, Rfl: 0  Objective: BP 96/62 (BP Location: Left Arm, Patient Position: Sitting, Cuff Size: Large)   Pulse 68   Temp 98.2 F (36.8 C) (Oral)   Ht 5\' 6"  (1.676 m)   Wt 274 lb 3.2 oz (124.4 kg)   LMP 10/11/2016   SpO2 98%   BMI 44.26 kg/m  General: Awake, appears stated age HEENT: MMM, pharynx pink, no exudates, nares patent, turbinate swelling noted b/l, more pronounced on R side, +R sided  rhinorrhea, ears neg b/l, EOMi Heart: RRR, no murmurs Lungs: CTAB, no rales, wheezes or rhonchi. No accessory muscle use Skin: on L medial buttock over cephalad portion of gluteal cleft, there is a 0.75 cm linear incision, no significant erythema, fluctuance, or warmth, +TTP Psych: Age appropriate judgment and insight, normal affect and mood  Assessment and Plan: Pilonidal cyst - Plan: Ambulatory referral to General Surgery, HYDROcodone-acetaminophen (NORCO/VICODIN) 5-325 MG tablet  Viral URI with cough - Plan: benzonatate (TESSALON) 100 MG capsule  Hyperprolactinemia ([place]) - Plan: Prolactin  Orders as above. Cont abx. Ice. Don't use pain medicine while driving or doing anything that requires her wits. Refer to gen surgery for pilonidal cyst (or a deep reg cyst). URI symptoms likely  viral in etiology, current abx would knock out any bacterial causes. F/u prn. The patient voiced understanding and agreement to the plan.  [place], DO 10/20/16  1:04 PM

## 2016-10-24 ENCOUNTER — Encounter: Payer: Self-pay | Admitting: Family Medicine

## 2016-11-03 ENCOUNTER — Encounter: Payer: Self-pay | Admitting: Family Medicine

## 2016-11-03 ENCOUNTER — Encounter: Payer: Self-pay | Admitting: Endocrinology

## 2016-11-03 NOTE — Telephone Encounter (Signed)
Called and [facility] @ 11:54am @ [phone]) and sent a MyChart message informing the pt that we received her MyChart message and Dr. Nani Ravens would like for her to call the office to schedule an appointment to discuss this her concerns.  Asked the pt to please call the office to schedule an appt.//AB/CMA

## 2016-11-03 NOTE — Telephone Encounter (Signed)
Please schedule her for appt to discuss. Ty.

## 2016-11-24 ENCOUNTER — Encounter: Payer: Self-pay | Admitting: Family Medicine

## 2016-11-24 ENCOUNTER — Ambulatory Visit (INDEPENDENT_AMBULATORY_CARE_PROVIDER_SITE_OTHER): Admitting: Family Medicine

## 2016-11-24 VITALS — BP 100/72 | HR 71 | Temp 98.6°F | Ht 66.0 in | Wt 271.4 lb

## 2016-11-24 DIAGNOSIS — F418 Other specified anxiety disorders: Secondary | ICD-10-CM

## 2016-11-24 MED ORDER — FLUOXETINE HCL 20 MG PO TABS: 20.0000 mg | ORAL_TABLET | Freq: Every day | ORAL | 1 refills | Status: DC

## 2016-11-24 NOTE — Progress Notes (Signed)
Chief Complaint  Patient presents with  . Follow-up    mental heatlh    Subjective Amber [name] is an 36 y.o. female who presents with anxiety/depression Symptoms began 10-15 years since that time. She moved down here from 1 year ago and things worsened.  Feelings of anxiousness and agitation. Family history significant for bipolar in sisters and mother. Possible organic causes contributing are- none Social stressors include- recent move with low amount of social support, takes care of disabled husband who also has PTSD. She is not currently on any meds. She has been on another She is not following with a psychologist. She has in past before she moved and is interested in setting up again.   Past Medical History:  Diagnosis Date  . Asthma   . Depression   . GERD (gastroesophageal reflux disease)   . History of chicken pox   . History of migraine headaches   . Seizures ([place])     Medications Current Outpatient Prescriptions on File Prior to Visit  Medication Sig Dispense Refill  . cabergoline (DOSTINEX) 0.5 MG tablet Take 1 tablet (0.5 mg total) by mouth 2 (two) times a week. 12 tablet 2  . Magnesium 400 MG TABS Take 400 mg by mouth daily.    . propranolol (INDERAL) 40 MG tablet TAKE 1 TABLET (40 MG TOTAL) BY MOUTH 2 (TWO) TIMES DAILY. 180 tablet 0  . acetaminophen (TYLENOL) 500 MG tablet Take 500 mg by mouth as needed.    . butalbital-acetaminophen-caffeine (FIORICET, ESGIC) 50-325-40 MG tablet Take 1 tablet by mouth every 4 (four) hours as needed. for pain (Patient not taking: Reported on 11/24/2016) 30 tablet 0  . diphenhydrAMINE (BENADRYL) 50 MG tablet Take 50 mg by mouth at bedtime as needed for itching.     Allergies Allergies  Allergen Reactions  . Aspirin   . Ibuprofen   . Iodine   . Nsaids Nausea And Vomiting  . Sulfa Antibiotics   . Tape Rash    Blisters    Family History Family History  Problem Relation Age of Onset  . Diabetes Mother   . Migraines  Mother   . Hyperlipidemia Mother   . Bipolar disorder Mother   . Diabetes Father   . Colon cancer Father   . Cancer Father        colon  . Hyperlipidemia Father   . Hypertension Father   . Other Neg Hx        pituitary disorder     Review Of Systems Constitutional:  no unexplained fevers, sweats, or chills Cardiovascular:  no chest pain, no palpitations Gastrointestinal:  no nausea, vomiting, diarrhea, or constipation Psychiatric: as noted in HPI  Exam BP 100/72 (BP Location: Right Arm, Patient Position: Sitting, Cuff Size: Large)   Pulse 71   Temp 98.6 F (37 C) (Oral)   Ht 5\' 6"  (1.676 m)   Wt 271 lb 6 oz (123.1 kg)   SpO2 97%   BMI 43.80 kg/m  General:  well developed, well nourished, in no apparent distress Lungs: normal respiratory effort without accessory muscle use Psych: well oriented with normal range of affect and age-appropriate judgement/insight  Assessment and Plan  Anxiety with depression - Plan: FLUoxetine (PROZAC) 20 MG tablet  Status: New Start Prozac, 1/2 tab daily for 2 weeks. Discussed risk of suicidal ideation which she was already aware of.  Counseled on diet and exercise positively affecting above and she has improved both since I saw her last.  Counseled on  the diagnosis, course and treatment of the above condition(s) Suicidal ideation was strongly denied Follow up in 1 mo. Pt voiced understanding and agreement to the plan.  [place], DO 11/24/16 2:27 PM

## 2016-11-24 NOTE — Progress Notes (Signed)
Pre visit review using our clinic review tool, if applicable. No additional management support is needed unless otherwise documented below in the visit note. 

## 2016-11-24 NOTE — Patient Instructions (Signed)
Please consider counseling. The medical literature and evidence-based guidelines support it. Contact [phone] to schedule an appointment or inquire about cost/insurance coverage.  Keep up the great work with the exercising and cleaning up the diet.  Let me know if anything changes in the next month.

## 2016-12-05 ENCOUNTER — Ambulatory Visit: Admitting: Neurology

## 2016-12-25 ENCOUNTER — Other Ambulatory Visit: Payer: Self-pay | Admitting: Endocrinology

## 2017-01-03 ENCOUNTER — Ambulatory Visit: Admitting: Neurology

## 2017-01-15 ENCOUNTER — Other Ambulatory Visit: Payer: Self-pay | Admitting: Family Medicine

## 2017-02-03 ENCOUNTER — Encounter: Payer: Self-pay | Admitting: Family Medicine

## 2017-02-07 ENCOUNTER — Other Ambulatory Visit: Payer: Self-pay | Admitting: Family Medicine

## 2017-02-07 DIAGNOSIS — F418 Other specified anxiety disorders: Secondary | ICD-10-CM

## 2017-02-07 MED ORDER — FLUOXETINE HCL 20 MG PO TABS: 20.0000 mg | ORAL_TABLET | Freq: Every day | ORAL | 1 refills | Status: DC

## 2017-03-23 ENCOUNTER — Encounter: Payer: Self-pay | Admitting: Neurology

## 2017-03-23 ENCOUNTER — Encounter: Payer: Self-pay | Admitting: Endocrinology

## 2017-03-23 ENCOUNTER — Ambulatory Visit (INDEPENDENT_AMBULATORY_CARE_PROVIDER_SITE_OTHER): Admitting: Neurology

## 2017-03-23 ENCOUNTER — Ambulatory Visit (INDEPENDENT_AMBULATORY_CARE_PROVIDER_SITE_OTHER): Admitting: Endocrinology

## 2017-03-23 VITALS — BP 118/78 | HR 94 | Wt 275.0 lb

## 2017-03-23 VITALS — BP 124/70 | HR 80 | Ht 66.0 in | Wt 274.0 lb

## 2017-03-23 DIAGNOSIS — E221 Hyperprolactinemia: Secondary | ICD-10-CM | POA: Diagnosis not present

## 2017-03-23 DIAGNOSIS — G43419 Hemiplegic migraine, intractable, without status migrainosus: Secondary | ICD-10-CM | POA: Diagnosis not present

## 2017-03-23 DIAGNOSIS — G43709 Chronic migraine without aura, not intractable, without status migrainosus: Secondary | ICD-10-CM | POA: Diagnosis not present

## 2017-03-23 DIAGNOSIS — IMO0002 Reserved for concepts with insufficient information to code with codable children: Secondary | ICD-10-CM

## 2017-03-23 LAB — TSH: TSH: 0.84 u[IU]/mL (ref 0.35–4.50)

## 2017-03-23 MED ORDER — TRAMADOL HCL 50 MG PO TABS: 50.0000 mg | ORAL_TABLET | Freq: Four times a day (QID) | ORAL | 1 refills | Status: DC | PRN

## 2017-03-23 NOTE — Progress Notes (Signed)
NEUROLOGY CONSULTATION NOTE  Amber [name] MRN: [id number] DOB: Jun 10, 1980  Referring provider: Dr. Nani Ravens Primary care provider: Dr. Nani Ravens  Reason for consult:  migraine  HISTORY OF PRESENT ILLNESS: Amber [name] is a 37 year old female with pilonidal cyst with hyperprolactinemia and anxiety with depression who presents for migraines.  History supplemented by PCP note.  Migraines: Onset:  childhood Location:  Left sided Quality:  throbbing Intensity:  severe Aura:  no Prodrome:  Feels ill for 3 days prior Postdrome:  "hangover" effect for 1 day after Associated symptoms:  Left sided numbness of face, arm and leg with slight weakness of left arm and leg.  Nausea, photophobia, and phonophobia.  She has not had any new worse headache of her life, waking up from sleep Duration:  Several hours to several days.  Over the summer, she had status migrainosis lasting 13-14 weeks. Frequency:  3 to 4 days a week Frequency of abortive medication: 3 to 4 days a week Triggers/exacerbating factors:  Shifting position Relieving factors:  Laying down in dark quiet room Activity:  Aggravates.  Past NSAIDS:  Ibuprofen.  Cannot take NSAIDs due to GI bleed Past analgesics:  Tramadol (decreases intensity) Past abortive triptans/ergot:  Sumatriptan (increased headache) Past muscle relaxants:  no Past anti-emetic:  Reglan (contraindicated with cabergoline), Zofran 4mg  Past antihypertensive medications:  Propranolol 80mg  (hypotension) Past antidepressant medications:  Prozac Past anticonvulsant medications:  no Past vitamins/Herbal/Supplements:  Magnesium 400mg  Past antihistamines/decongestants:  no Other past therapies:  no  Current NSAIDS:  no Current analgesics:  Tylenol Current triptans:  no Current anti-emetic:  promethazine Current muscle relaxants:  no Current anti-anxiolytic:  no Current sleep aide:  melatonin Current Antihypertensive medications:  no Current  Antidepressant medications:  no Current Anticonvulsant medications:  no Current Vitamins/Herbal/Supplements:  melatonin Current Antihistamines/Decongestants:  Benadryl Other therapy:  no Other medication:  Cabergoline  Cluster Headache: Right orbital, stabbing, severe, associated with ptosis and conjunctival injection, lasting several hours and occurring 1 to 2 times a month.  Caffeine:  1 cup coffee daily Alcohol:  no Smoker:  no Diet:  Trying to increase water.  Little fast food.  No soda.  Eats chicken and fish. 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    SHE IS CURRENTLY TRYING TO GET PREGNANT.  PAST MEDICAL HISTORY: Past Medical History:  Diagnosis Date  . Asthma   . Depression   . GERD (gastroesophageal reflux disease)   . History of chicken pox   . History of migraine headaches   . Seizures ([place])     PAST SURGICAL HISTORY: Past Surgical History:  Procedure Laterality Date  . APPENDECTOMY    . CHOLECYSTECTOMY    . Pituiary Tumor     Tumor Removal Pituitary    MEDICATIONS: Current Outpatient Medications on File Prior to Visit  Medication Sig Dispense Refill  . acetaminophen (TYLENOL) 500 MG tablet Take 500 mg by mouth as needed.    . cabergoline (DOSTINEX) 0.5 MG tablet Take 1 tablet (0.5 mg total) by mouth 2 (two) times a week. 12 tablet 2  . diphenhydrAMINE (BENADRYL) 50 MG tablet Take 50 mg by mouth at bedtime as needed for itching.    . Melatonin 3 MG TABS Take 6 mg by mouth daily.    Marland Kitchen FLUoxetine (  PROZAC) 20 MG tablet Take 1 tablet (20 mg total) daily by mouth. (Patient not taking: Reported on 03/23/2017) 90 tablet 1   No current facility-administered medications on file prior to visit.     ALLERGIES: Allergies  Allergen  Reactions  . Aspirin   . Chlorhexidine   . Ibuprofen   . Iodine   . Nsaids Nausea And Vomiting  . Sulfa Antibiotics   . Tape Rash    Blisters     FAMILY HISTORY: Family History  Problem Relation Age of Onset  . Diabetes Mother   . Migraines Mother   . Hyperlipidemia Mother   . Bipolar disorder Mother   . Diabetes Father   . Colon cancer Father   . Cancer Father        colon  . Hyperlipidemia Father   . Hypertension Father   . Other Neg Hx        pituitary disorder    SOCIAL HISTORY: Social History   Socioeconomic History  . Marital status: Married    Spouse name: Not on file  . Number of children: Not on file  . Years of education: Not on file  . Highest education level: Not on file  Social Needs  . Financial resource strain: Not on file  . Food insecurity - worry: Not on file  . Food insecurity - inability: Not on file  . Transportation needs - medical: Not on file  . Transportation needs - non-medical: Not on file  Occupational History  . Not on file  Tobacco Use  . Smoking status: Former Research scientist (life sciences)  . Smokeless tobacco: Never Used  Substance and Sexual Activity  . Alcohol use: No  . Drug use: No  . Sexual activity: Not on file  Other Topics Concern  . Not on file  Social History Narrative  . Not on file    REVIEW OF SYSTEMS: Constitutional: No fevers, chills, or sweats, no generalized fatigue, change in appetite Eyes: No visual changes, double vision, eye pain Ear, nose and throat: No hearing loss, ear pain, nasal congestion, sore throat Cardiovascular: No chest pain, palpitations Respiratory:  No shortness of breath at rest or with exertion, wheezes GastrointestinaI: No nausea, vomiting, diarrhea, abdominal pain, fecal incontinence Genitourinary:  No dysuria, urinary retention or frequency Musculoskeletal:  No neck pain, back pain Integumentary: No rash, pruritus, skin lesions Neurological: as above Psychiatric: No depression, insomnia,  anxiety Endocrine: No palpitations, fatigue, diaphoresis, mood swings, change in appetite, change in weight, increased thirst Hematologic/Lymphatic:  No purpura, petechiae. Allergic/Immunologic: no itchy/runny eyes, nasal congestion, recent allergic reactions, rashes  PHYSICAL EXAM: Vitals:   03/23/17 1256  BP: 124/70  Pulse: 80  SpO2: 92%   General: No acute distress.  Patient appears well-groomed.  Head:  Normocephalic/atraumatic Eyes:  fundi examined but not visualized Neck: supple, no paraspinal tenderness, full range of motion Back: No paraspinal tenderness Heart: regular rate and rhythm Lungs: Clear to auscultation bilaterally. Vascular: No carotid bruits. Neurological Exam: Mental status: alert and oriented to person, place, and time, recent and remote memory intact, fund of knowledge intact, attention and concentration intact, speech fluent and not dysarthric, language intact. Cranial nerves: CN I: not tested CN II: pupils equal, round and reactive to light, visual fields intact CN III, IV, VI:  full range of motion, no nystagmus, no ptosis CN V: facial sensation intact CN VII: upper and lower face symmetric CN VIII: hearing intact CN IX, X: gag intact, uvula midline CN XI: sternocleidomastoid and trapezius muscles  intact CN XII: tongue midline Bulk & Tone: normal, no fasciculations. Motor:  5/5 throughout  Sensation: temperature and vibration sensation intact. Deep Tendon Reflexes:  2+ throughout, toes downgoing.  Finger to nose testing:  Without dysmetria.  Heel to shin:  Without dysmetria.  Gait:  Normal station and stride.  Able to turn and tandem walk. Romberg negative.  IMPRESSION: 1.  Intractable hemiplegic migraine 2.  Cluster headache 3.  Pituitary microadenoma  Abortive therapy is a challenge.  She is unable to take NSAIDs due to GI bleed/upset.  As she has hemiplegic migraines, triptans and ergots are contraindicated.  She cannot take anti-emetics such  as Reglan or Thorazine as they may reduce efficacy of cabergoline.  PLAN: 1.  Tramadol 50mg  with tylenol 650mg  for abortive therapy.  Promethazine for nausea. 2.  As she is trying to get pregnant, I would not start a preventative at this time.  Advised to start magnesium and riboflavin (and CoQ10 if okay with OB/GYN). 3.  Lifestyle modification: exercise, sleep hygiene, hydration, caffeine cessation 4.  Follow up in 3 months.  Thank you for allowing me to take part in the care of this patient.  Metta Clines, DO  CC: Riki Sheer, DO

## 2017-03-23 NOTE — Patient Instructions (Addendum)
blood tests are requested for you today.  We'll let you know about the results.  Then most likely, we'll resume the carbergoline. Please come back for a follow-up appointment in 6 months, or sooner if the pregnancy happens.

## 2017-03-23 NOTE — Progress Notes (Signed)
Subjective:    Patient ID: Amber [name], female    DOB: [birth year], 37 y.o.   MRN: [id number]  HPI Pt returns for f/u of hyperprolactinemia (dx'ed 1996, in eval of primary amenorrhea; she had resection of prolactinoma in 2009, in [place], [place]; she says prolactin level normalized, and menses resumed; it recurred in 2015, but MRI was normal; she took cabergoline for a few months, but stopped, due to insurance; she is G0, but would like to start a pregnancy.  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  She has regained her insurance.  She is out of cabergoline.  She has reg menses.  She is still considering a pregnancy.   Past Medical History:  Diagnosis Date  . Asthma   . Depression   . GERD (gastroesophageal reflux disease)   . History of chicken pox   . History of migraine headaches   . Seizures ([place])     Past Surgical History:  Procedure Laterality Date  . APPENDECTOMY    . CHOLECYSTECTOMY    . Pituiary Tumor     Tumor Removal Pituitary    Social History   Socioeconomic History  . Marital status: Married    Spouse name: Not on file  . Number of children: Not on file  . Years of education: Not on file  . Highest education level: Not on file  Social Needs  . Financial resource strain: Not on file  . Food insecurity - worry: Not on file  . Food insecurity - inability: Not on file  . Transportation needs - medical: Not on file  . Transportation needs - non-medical: Not on file  Occupational History  . Not on file  Tobacco Use  . Smoking status: Former Research scientist (life sciences)  . Smokeless tobacco: Never Used  Substance and Sexual Activity  . Alcohol use: No  . Drug use: No  . Sexual activity: Not on file  Other Topics Concern  . Not on file  Social History Narrative  . Not on file     Current Outpatient Medications on File Prior to Visit  Medication Sig Dispense Refill  . acetaminophen (TYLENOL) 500 MG tablet Take 500 mg by mouth as needed.    . diphenhydrAMINE (BENADRYL) 50 MG tablet Take 50 mg by mouth at bedtime as needed for itching.    . Melatonin 3 MG TABS Take 6 mg by mouth daily.    . traMADol (ULTRAM) 50 MG tablet Take 1 tablet (50 mg total) by mouth every 6 (six) hours as needed. 30 tablet 1   No current facility-administered medications on file prior to visit.     Allergies  Allergen Reactions  . Aspirin   . Chlorhexidine   . Ibuprofen   . Iodine   . Nsaids Nausea And Vomiting  . Sulfa Antibiotics   . Tape Rash    Blisters     Family History  Problem Relation Age of Onset  . Diabetes Mother   . Migraines Mother   . Hyperlipidemia Mother   . Bipolar disorder Mother   . Diabetes Father   . Colon cancer Father   . Cancer Father        colon  . Hyperlipidemia Father   . Hypertension Father   . Other Neg Hx  pituitary disorder    BP 118/78 (BP Location: Left Arm, Patient Position: Sitting, Cuff Size: Normal)   Pulse 94   Wt 275 lb (124.7 kg)   SpO2 94%   BMI 44.39 kg/m   Review of Systems Denies galactorrhea.  She saw neurol for headache today.      Objective:   Physical Exam VITAL SIGNS:  See vs page GENERAL: no distress NECK: There is no palpable thyroid enlargement.  No thyroid nodule is palpable.  No palpable lymphadenopathy at the anterior neck.       Assessment & Plan:  Hyperprolactinemia: due for recheck Headache: not related to the above.   Patient Instructions  blood tests are requested for you today.  We'll let you know about the results.  Then most likely, we'll resume the carbergoline. Please come back for a follow-up appointment in 6 months, or sooner if the pregnancy happens.

## 2017-03-23 NOTE — Patient Instructions (Signed)
Migraine Recommendations: 1.  Try taking magnesium 400 to 600mg  daily and riboflavin (B2) 400mg  daily.  If your GYN/OB approves, also take Coenzyme Q 10 100mg  three times daily 2.  Take tramadol 50mg  with acetaminophen 650 at earliest onset of headache.  May repeat dose in 6 hours if needed.  Discontinue tramadol once you get pregnant (unless okay by your OB/GYN). 3.  Limit use of pain relievers to no more than 2 days out of the week.  These medications include acetaminophen, ibuprofen, triptans and narcotics.  This will help reduce risk of rebound headaches. 4.  Be aware of common food triggers such as processed sweets, processed foods with nitrites (such as deli meat, hot dogs, sausages), foods with MSG, alcohol (such as wine), chocolate, certain cheeses, certain fruits (dried fruits, bananas, some citrus fruit), vinegar, diet soda. 4.  Avoid caffeine 5.  Routine exercise 6.  Proper sleep hygiene 7.  Stay adequately hydrated with water 8.  Keep a headache diary. 9.  Maintain proper stress management. 10.  Do not skip meals. 11.  Follow up in 3 months.

## 2017-03-24 ENCOUNTER — Ambulatory Visit (INDEPENDENT_AMBULATORY_CARE_PROVIDER_SITE_OTHER): Admitting: Family Medicine

## 2017-03-24 ENCOUNTER — Encounter: Payer: Self-pay | Admitting: Family Medicine

## 2017-03-24 VITALS — BP 118/72 | HR 73 | Temp 98.4°F | Ht 67.0 in | Wt 277.4 lb

## 2017-03-24 DIAGNOSIS — L300 Nummular dermatitis: Secondary | ICD-10-CM | POA: Diagnosis not present

## 2017-03-24 DIAGNOSIS — F418 Other specified anxiety disorders: Secondary | ICD-10-CM | POA: Diagnosis not present

## 2017-03-24 LAB — PROLACTIN: PROLACTIN: 109.9 ng/mL — AB

## 2017-03-24 MED ORDER — CABERGOLINE 0.5 MG PO TABS: 0.5000 mg | ORAL_TABLET | ORAL | 3 refills | Status: DC

## 2017-03-24 MED ORDER — FLUOXETINE HCL 20 MG PO TABS: 20.0000 mg | ORAL_TABLET | Freq: Every day | ORAL | 1 refills | Status: DC

## 2017-03-24 MED ORDER — TRIAMCINOLONE ACETONIDE 0.5 % EX CREA: 1.0000 "application " | TOPICAL_CREAM | Freq: Two times a day (BID) | CUTANEOUS | 0 refills | Status: DC

## 2017-03-24 NOTE — Progress Notes (Signed)
Chief Complaint  Patient presents with  . Eczema    Amber [name] is a 37 y.o. female here for a skin complaint.  Duration: 1.5 months Location: LE's, UE, and back Pruritic? No Painful? No Drainage? No New soaps/lotions/topicals/detergents? No Sick contacts? No Other associated symptoms: none Therapies tried thus far: husband's steroid cream (clobetasol)  ROS:  Const: No fevers Skin: As noted in HPI  Past Medical History:  Diagnosis Date  . Asthma   . Depression   . GERD (gastroesophageal reflux disease)   . History of chicken pox   . History of migraine headaches   . Seizures ([facility])    Allergies  Allergen Reactions  . Aspirin   . Chlorhexidine   . Ibuprofen   . Iodine   . Nsaids Nausea And Vomiting  . Sulfa Antibiotics   . Tape Rash    Blisters      BP 118/72 (BP Location: Left Arm, Patient Position: Sitting, Cuff Size: Large)   Pulse 73   Temp 98.4 F (36.9 C) (Oral)   Ht 5\' 7"  (1.702 m)   Wt 277 lb 6 oz (125.8 kg)   SpO2 96%   BMI 43.44 kg/m  Gen: awake, alert, appearing stated age Lungs: No accessory muscle use Skin: See below.  No drainage, erythema, TTP, fluctuance, excoriation Psych: Age appropriate judgment and insight  Media Information    Nummular eczema - Plan: triamcinolone cream (KENALOG) 0.5 %  Anxiety with depression - Plan: FLUoxetine (PROZAC) 20 MG tablet  Orders as above. As lesions on LE's responded to steroids, will tx with that as it is less likely to be related to fungal etiology. Refill Prozac.  Info for GYN given as she wishes to get pregnant.  F/u prn. The patient voiced understanding and agreement to the plan.  [place], DO 03/24/17 2:40 PM

## 2017-03-24 NOTE — Patient Instructions (Addendum)
Take 1/2 tab Prozac daily for the first 2 weeks then go back to 1 full tab daily.   Use the Eucerin equivalent during the day.   Send me a MyChart message if this does not improve.   Call Center for [place] at [facility] at [phone] for an appointment.  They are located at [address], [place], [place], [address] (right across the hall from our office).

## 2017-03-24 NOTE — Progress Notes (Signed)
Pre visit review using our clinic review tool, if applicable. No additional management support is needed unless otherwise documented below in the visit note. 

## 2017-05-05 ENCOUNTER — Other Ambulatory Visit: Payer: Self-pay | Admitting: Neurology

## 2017-05-17 ENCOUNTER — Other Ambulatory Visit: Payer: Self-pay | Admitting: Neurology

## 2017-05-20 ENCOUNTER — Other Ambulatory Visit: Payer: Self-pay | Admitting: Family Medicine

## 2017-05-20 ENCOUNTER — Other Ambulatory Visit: Payer: Self-pay | Admitting: Neurology

## 2017-05-20 DIAGNOSIS — L300 Nummular dermatitis: Secondary | ICD-10-CM

## 2017-05-22 ENCOUNTER — Encounter: Payer: Self-pay | Admitting: Neurology

## 2017-05-23 ENCOUNTER — Other Ambulatory Visit: Payer: Self-pay | Admitting: *Deleted

## 2017-05-23 MED ORDER — TRAMADOL HCL 50 MG PO TABS: 50.0000 mg | ORAL_TABLET | Freq: Four times a day (QID) | ORAL | 1 refills | Status: DC | PRN

## 2017-06-05 ENCOUNTER — Encounter: Payer: Self-pay | Admitting: Family Medicine

## 2017-06-05 ENCOUNTER — Telehealth: Payer: Self-pay | Admitting: Family Medicine

## 2017-06-05 NOTE — Telephone Encounter (Signed)
I am having a flair up of my Pilonidal Cyst since Thursday. I'm in a good deal of pain. It is not draining at this point. I have been heat packing several times a day as well as soaking in a hot bath once a day. I did not want to go to the ER because frankly the FLU scares me. Should I make an appointment to come in or should I go into the ER and have it drained and packed?   Thank you for you time  Amber [name]

## 2017-06-14 ENCOUNTER — Encounter: Admitting: Obstetrics & Gynecology

## 2017-06-22 ENCOUNTER — Other Ambulatory Visit: Payer: Self-pay | Admitting: Family Medicine

## 2017-06-22 DIAGNOSIS — F418 Other specified anxiety disorders: Secondary | ICD-10-CM

## 2017-06-27 ENCOUNTER — Ambulatory Visit: Admitting: Neurology

## 2017-06-27 ENCOUNTER — Ambulatory Visit: Admitting: Endocrinology

## 2017-07-05 ENCOUNTER — Encounter: Admitting: Obstetrics & Gynecology

## 2017-07-05 DIAGNOSIS — N979 Female infertility, unspecified: Secondary | ICD-10-CM

## 2017-07-30 ENCOUNTER — Other Ambulatory Visit: Payer: Self-pay | Admitting: Endocrinology

## 2017-08-17 ENCOUNTER — Other Ambulatory Visit: Payer: Self-pay | Admitting: Neurology

## 2017-08-17 NOTE — Telephone Encounter (Signed)
No refill until she makes an appointment.

## 2017-08-17 NOTE — Telephone Encounter (Signed)
Dr. Tomi Likens- please advise on refill. Patient seen in January. No showed last appt. No appt scheduled.

## 2017-08-20 ENCOUNTER — Other Ambulatory Visit: Payer: Self-pay | Admitting: Family Medicine

## 2017-08-20 ENCOUNTER — Other Ambulatory Visit: Payer: Self-pay | Admitting: Neurology

## 2017-08-20 DIAGNOSIS — F418 Other specified anxiety disorders: Secondary | ICD-10-CM

## 2017-08-21 ENCOUNTER — Encounter: Payer: Self-pay | Admitting: Family Medicine

## 2017-08-21 ENCOUNTER — Other Ambulatory Visit: Payer: Self-pay | Admitting: Family Medicine

## 2017-08-21 ENCOUNTER — Other Ambulatory Visit: Payer: Self-pay | Admitting: Neurology

## 2017-08-21 DIAGNOSIS — F418 Other specified anxiety disorders: Secondary | ICD-10-CM

## 2017-08-21 MED ORDER — FLUOXETINE HCL (PMDD) 20 MG PO TABS: 1.0000 | ORAL_TABLET | Freq: Every day | ORAL | 0 refills | Status: DC

## 2017-11-20 ENCOUNTER — Other Ambulatory Visit: Payer: Self-pay | Admitting: Neurology

## 2017-12-11 ENCOUNTER — Other Ambulatory Visit: Payer: Self-pay | Admitting: Family Medicine

## 2017-12-11 DIAGNOSIS — F418 Other specified anxiety disorders: Secondary | ICD-10-CM

## 2017-12-12 NOTE — Telephone Encounter (Signed)
Pt. Requesting fluoxetin refill, last refilled 08/21/17, #90, 0RF. LOV 03/2017. Routed to Dr. Nani Ravens to advise on need for OV.

## 2017-12-12 NOTE — Telephone Encounter (Signed)
30 sent in. Needs appt for more. TY.

## 2017-12-14 NOTE — Telephone Encounter (Signed)
Author phoned pt. To set up OV per Dr. Nani Ravens. No answer, author left VM asking for return call to set up OV. OK for PEC to schedule for October.

## 2017-12-25 ENCOUNTER — Ambulatory Visit: Admitting: Family Medicine

## 2017-12-26 ENCOUNTER — Encounter: Payer: Self-pay | Admitting: Family Medicine

## 2018-01-02 ENCOUNTER — Encounter: Payer: Self-pay | Admitting: Family Medicine

## 2018-01-08 ENCOUNTER — Other Ambulatory Visit: Payer: Self-pay

## 2018-01-08 ENCOUNTER — Other Ambulatory Visit: Payer: Self-pay | Admitting: Endocrinology

## 2018-01-08 MED ORDER — CABERGOLINE 0.5 MG PO TABS: 0.5000 mg | ORAL_TABLET | ORAL | 2 refills | Status: DC

## 2018-01-16 ENCOUNTER — Other Ambulatory Visit: Payer: Self-pay | Admitting: Family Medicine

## 2018-01-16 DIAGNOSIS — F418 Other specified anxiety disorders: Secondary | ICD-10-CM

## 2018-01-18 ENCOUNTER — Ambulatory Visit (INDEPENDENT_AMBULATORY_CARE_PROVIDER_SITE_OTHER): Admitting: Family Medicine

## 2018-01-18 ENCOUNTER — Encounter: Payer: Self-pay | Admitting: Family Medicine

## 2018-01-18 VITALS — BP 108/70 | HR 73 | Temp 98.9°F | Ht 67.0 in | Wt 275.5 lb

## 2018-01-18 DIAGNOSIS — F4000 Agoraphobia, unspecified: Secondary | ICD-10-CM | POA: Diagnosis not present

## 2018-01-18 DIAGNOSIS — E221 Hyperprolactinemia: Secondary | ICD-10-CM | POA: Diagnosis not present

## 2018-01-18 DIAGNOSIS — F418 Other specified anxiety disorders: Secondary | ICD-10-CM | POA: Diagnosis not present

## 2018-01-18 DIAGNOSIS — G43809 Other migraine, not intractable, without status migrainosus: Secondary | ICD-10-CM | POA: Diagnosis not present

## 2018-01-18 MED ORDER — VENLAFAXINE HCL ER 75 MG PO CP24: 75.0000 mg | ORAL_CAPSULE | Freq: Every day | ORAL | 3 refills | Status: DC

## 2018-01-18 MED ORDER — HYDROXYZINE HCL 25 MG PO TABS: 25.0000 mg | ORAL_TABLET | Freq: Three times a day (TID) | ORAL | 1 refills | Status: DC | PRN

## 2018-01-18 NOTE — Progress Notes (Signed)
Chief Complaint  Patient presents with  . Panic Attack  . Medication Refill    Subjective Amber [name] presents for f/u anxiety/depression.  She is currently being treated with Prozac 20 mg/d.  Has been having more panic attacks lately. Has not been leaving her house due to fear. This is been getting worse over the past year.  No inciting event or particular stressor. No thoughts of harming self or others. No self-medication with alcohol, prescription drugs or illicit drugs. Pt is not following with a counselor/psychologist. She has never had venlafaxine before.  She has a history of headaches.  She was prescribed magnesium and B2, but does not believe they are helpful.  She was on propanolol in the past which did help however did lower her blood pressure.  She is unable to tolerate triptan's.  She does follow with the neurology team.  ROS Psych: No homicidal or suicidal thoughts Neuro: +HA  Past Medical History:  Diagnosis Date  . Asthma   . Depression   . GERD (gastroesophageal reflux disease)   . History of chicken pox   . History of migraine headaches   . Seizures ([facility])      Exam BP 108/70 (BP Location: Left Arm, Patient Position: Sitting, Cuff Size: Large)   Pulse 73   Temp 98.9 F (37.2 C) (Oral)   Ht 5\' 7"  (1.702 m)   Wt 275 lb 8 oz (125 kg)   SpO2 96%   BMI 43.15 kg/m  General:  well developed, well nourished, in no apparent distress Neck: neck supple without adenopathy, thyromegaly, or masses Lungs:  clear to auscultation, breath sounds equal bilaterally, no respiratory distress Cardio:  regular rate and rhythm without murmurs, heart sounds without clicks or rubs Neuro: DTRs equal and symmetric, no cerebellar signs, no clonus Psych: well oriented with normal range of affect and age-appropriate judgement/insight, alert and oriented x4.  Assessment and Plan  Agoraphobia - Plan: venlafaxine XR (EFFEXOR XR) 75 MG 24 hr capsule, hydrOXYzine  (ATARAX/VISTARIL) 25 MG tablet  Anxiety with depression - Plan: venlafaxine XR (EFFEXOR XR) 75 MG 24 hr capsule, hydrOXYzine (ATARAX/VISTARIL) 25 MG tablet  Hyperprolactinemia ([facility]) - Plan: Prolactin  Other migraine without status migrainosus, not intractable - Plan: venlafaxine XR (EFFEXOR XR) 75 MG 24 hr capsule  Change Prozac to Effexor.  No need to steadily increase/decrease dosage.  Add hydroxyzine 25-75 mg every 8 hours as needed for anxiety.  Number for counseling provided again.  Counseled on exercise. Advised to remain on recommendations from neurology.  Perhaps changing Prozac to Effexor could be helpful with her headaches. F/u in 6 weeks. The patient voiced understanding and agreement to the plan.  [place], DO 01/18/18 1:50 PM

## 2018-01-18 NOTE — Patient Instructions (Addendum)
Please consider counseling. Contact [phone] to schedule an appointment or inquire about cost/insurance coverage.  Coping skills Choose 5 that work for you:  Take a deep breath  Count to 20  Read a book  Do a puzzle  Meditate  Bake  Sing  Knit  Garden  Pray  Go outside  Call a friend  Listen to music  Take a walk  Color  Send a note  Take a bath  Watch a movie  Be alone in a quiet place  Pet an animal  Visit a friend  Journal  Exercise  Stretch   Aim to do some physical exertion for 150 minutes per week. This is typically divided into 5 days per week, 30 minutes per day. The activity should be enough to get your heart rate up. Anything is better than nothing if you have time constraints.  Let us know if you need anything. 

## 2018-01-18 NOTE — Progress Notes (Signed)
Pre visit review using our clinic review tool, if applicable. No additional management support is needed unless otherwise documented below in the visit note. 

## 2018-01-19 LAB — PROLACTIN: Prolactin: 41.6 ng/mL — ABNORMAL HIGH

## 2018-01-20 ENCOUNTER — Other Ambulatory Visit: Payer: Self-pay | Admitting: Family Medicine

## 2018-01-20 DIAGNOSIS — F418 Other specified anxiety disorders: Secondary | ICD-10-CM

## 2018-01-20 DIAGNOSIS — F4000 Agoraphobia, unspecified: Secondary | ICD-10-CM

## 2018-01-23 ENCOUNTER — Telehealth: Payer: Self-pay

## 2018-01-23 ENCOUNTER — Telehealth: Payer: Self-pay | Admitting: Endocrinology

## 2018-01-23 NOTE — Telephone Encounter (Signed)
Dr. Loanne Drilling received notification from Dr. Nani Ravens informing him that the pt's Prolactin level is elevated and in need of an appt with Dr. Loanne Drilling. Per Dr. Cordelia Pen orders, called pt to inform she was in need of an appt. LVM requesting returned call.

## 2018-01-23 NOTE — Telephone Encounter (Signed)
FYI

## 2018-01-23 NOTE — Telephone Encounter (Signed)
-----   Message from Renato Shin, MD sent at 01/23/2018  2:13 PM EST ----- please call patient: Ov is due

## 2018-01-23 NOTE — Telephone Encounter (Signed)
Spoke with patient to schedule an appointment.  She stated it is difficult for her to come out to [place] by herself so she is going to see if she can find someone to come with her or find an endocrinologist in [place].  She will give [place] a call back when she knows for sure what she is going to do

## 2018-02-09 ENCOUNTER — Other Ambulatory Visit: Payer: Self-pay | Admitting: Family Medicine

## 2018-02-09 DIAGNOSIS — G43809 Other migraine, not intractable, without status migrainosus: Secondary | ICD-10-CM

## 2018-02-09 DIAGNOSIS — F4000 Agoraphobia, unspecified: Secondary | ICD-10-CM

## 2018-02-09 DIAGNOSIS — F418 Other specified anxiety disorders: Secondary | ICD-10-CM

## 2018-02-14 ENCOUNTER — Other Ambulatory Visit: Payer: Self-pay

## 2018-02-14 ENCOUNTER — Encounter (HOSPITAL_BASED_OUTPATIENT_CLINIC_OR_DEPARTMENT_OTHER): Payer: Self-pay | Admitting: Emergency Medicine

## 2018-02-14 ENCOUNTER — Emergency Department (HOSPITAL_BASED_OUTPATIENT_CLINIC_OR_DEPARTMENT_OTHER)
Admission: EM | Admit: 2018-02-14 | Discharge: 2018-02-14 | Disposition: A | Discharge status: Home / Self Care | Attending: Emergency Medicine | Admitting: Emergency Medicine

## 2018-02-14 DIAGNOSIS — L0501 Pilonidal cyst with abscess: Secondary | ICD-10-CM | POA: Insufficient documentation

## 2018-02-14 DIAGNOSIS — Z79899 Other long term (current) drug therapy: Secondary | ICD-10-CM | POA: Insufficient documentation

## 2018-02-14 DIAGNOSIS — Z87891 Personal history of nicotine dependence: Secondary | ICD-10-CM | POA: Insufficient documentation

## 2018-02-14 DIAGNOSIS — R51 Headache: Secondary | ICD-10-CM | POA: Insufficient documentation

## 2018-02-14 DIAGNOSIS — L0231 Cutaneous abscess of buttock: Secondary | ICD-10-CM | POA: Diagnosis present

## 2018-02-14 DIAGNOSIS — R519 Headache, unspecified: Secondary | ICD-10-CM

## 2018-02-14 LAB — PREGNANCY, URINE: Preg Test, Ur: NEGATIVE

## 2018-02-14 MED ORDER — SODIUM CHLORIDE 0.9 % IV SOLN
INTRAVENOUS | Status: DC
  Administered 2018-02-14: 11:00:00 via INTRAVENOUS

## 2018-02-14 MED ORDER — TRAMADOL HCL 50 MG PO TABS: 50.0000 mg | ORAL_TABLET | Freq: Four times a day (QID) | ORAL | 0 refills | Status: DC | PRN

## 2018-02-14 MED ORDER — SODIUM CHLORIDE 0.9 % IV BOLUS
1000.0000 mL | Freq: Once | INTRAVENOUS | Status: AC
  Administered 2018-02-14: 1000 mL via INTRAVENOUS

## 2018-02-14 MED ORDER — HYDROMORPHONE HCL 1 MG/ML IJ SOLN
1.0000 mg | Freq: Once | INTRAMUSCULAR | Status: AC
  Administered 2018-02-14: 1 mg via INTRAVENOUS
  Filled 2018-02-14: qty 1

## 2018-02-14 MED ORDER — LIDOCAINE HCL (PF) 1 % IJ SOLN
10.0000 mL | Freq: Once | INTRAMUSCULAR | Status: AC
  Administered 2018-02-14: 10 mL
  Filled 2018-02-14: qty 10

## 2018-02-14 MED ORDER — PROCHLORPERAZINE EDISYLATE 10 MG/2ML IJ SOLN
10.0000 mg | Freq: Once | INTRAMUSCULAR | Status: AC
  Administered 2018-02-14: 10 mg via INTRAVENOUS
  Filled 2018-02-14: qty 2

## 2018-02-14 MED ORDER — DOXYCYCLINE HYCLATE 100 MG PO CAPS: 100.0000 mg | ORAL_CAPSULE | Freq: Two times a day (BID) | ORAL | 0 refills | Status: DC

## 2018-02-14 MED ORDER — DIPHENHYDRAMINE HCL 50 MG/ML IJ SOLN
12.5000 mg | Freq: Once | INTRAMUSCULAR | Status: AC
  Administered 2018-02-14: 12.5 mg via INTRAVENOUS
  Filled 2018-02-14: qty 1

## 2018-02-14 MED ORDER — HYDROMORPHONE HCL 1 MG/ML IJ SOLN
INTRAMUSCULAR | Status: AC
  Filled 2018-02-14: qty 1

## 2018-02-14 NOTE — ED Triage Notes (Signed)
Pt reports having a pilonidal cyst that is inflamed and also a headache for the last several days with no relief from usual migraine meds

## 2018-02-14 NOTE — ED Notes (Signed)
Previous is non functional

## 2018-02-14 NOTE — ED Provider Notes (Signed)
[facility] EMERGENCY DEPARTMENT Provider Note   CSN: [id number] Arrival date & time: 02/14/18  0957     History   Chief Complaint Chief Complaint  Patient presents with  . Abscess  . Headache    HPI Amber [name] is a 37 y.o. female with a hx of asthma, depression, GERD, migraines, and prior pilonidal abscesses who presents to the ED with complaints of pilonidal abscess x 3-4 days and migraine which started at midnight.   Patient states she has a history of prior pilonidal abscess requiring I&D a couple of years ago and that her current presentation feels similar.  She states she has a painful, red, swollen area to the upper portion of her gluteal fold.  She states the discomfort is constant and worse with palpation.  No alleviating factors.  She has tried warm compresses without much change.  Denies vomiting, fever, or chills.   Patient states she is having a migraine, headache started gradually with steady progression. Pain is located to the left side of her head, constant, w/ associated nausea, and feels the same as prior migraines.  She tried taking tramadol at home without much change, takes this PRN for her migraines, is running low.  No other specific alleviating or aggravating factors.  Denies change in vision, dizziness, numbness, weakness, paresthesias, neck stiffness, fever, or chills.  She states that prior migraines have significantly improved with migraine cocktail including Compazine, Benadryl, Dilaudid, and fluids.  She adamantly denies chance of pregnancy.  HPI  Past Medical History:  Diagnosis Date  . Asthma   . Depression   . GERD (gastroesophageal reflux disease)   . History of chicken pox   . History of migraine headaches   . Seizures [facility])     Patient Active Problem List   Diagnosis Date Noted  . Agoraphobia 01/18/2018  . Anxiety with depression 03/24/2017  . Migraine 05/11/2016  . 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  Hyperprolactinemia ([place]) 03/02/2016    Past Surgical History:  Procedure Laterality Date  . APPENDECTOMY    . CHOLECYSTECTOMY    . Pituiary Tumor     Tumor Removal Pituitary     OB History   None      Home Medications    Prior to Admission medications   Medication Sig Start Date End Date Taking? Authorizing Provider  acetaminophen (TYLENOL) 500 MG tablet Take 500 mg by mouth as needed.    [provider]  cabergoline (DOSTINEX) 0.5 MG tablet Take 1 tablet (0.5 mg total) by mouth 2 (two) times a week. 01/08/18   Renato Shin, MD  diphenhydrAMINE (BENADRYL) 50 MG tablet Take 50 mg by mouth at bedtime as needed for itching.    [provider]  hydrOXYzine (ATARAX/VISTARIL) 25 MG tablet TAKE 1-3 TABLETS (25-75 MG TOTAL) BY MOUTH 3 (THREE) TIMES DAILY AS NEEDED FOR ANXIETY. 01/22/18   Shelda Pal, DO  magnesium oxide (MAG-OX) 400 MG tablet Take 400 mg by mouth daily.    [provider]  Riboflavin 400 MG CAPS Take 400 mg by mouth daily.    [provider]  traMADol (ULTRAM) 50 MG tablet TAKE 1 TABLET BY MOUTH EVERY 6 HOURS AS NEEDED 08/17/17   Tomi Likens, Adam R, DO  venlafaxine XR (EFFEXOR-XR) 75 MG 24 hr capsule TAKE 1 CAPSULE (75 MG TOTAL) BY MOUTH DAILY WITH BREAKFAST. 02/09/18   Shelda Pal, DO    Family History Family History  Problem Relation Age of Onset  .  Diabetes Mother   . Migraines Mother   . Hyperlipidemia Mother   . Bipolar disorder Mother   . Diabetes Father   . Colon cancer Father   . Cancer Father        colon  . Hyperlipidemia Father   . Hypertension Father   . Other Neg Hx        pituitary disorder    Social History Social History   Tobacco Use  . Smoking status: Former Research scientist (life sciences)  . Smokeless tobacco: Never Used  Substance Use Topics  . Alcohol use: No  . Drug use: No     Allergies   Aspirin; Chlorhexidine; Ibuprofen; Iodine; Nsaids; Sulfa antibiotics; and Tape   Review of Systems Review  of Systems  Constitutional: Negative for chills and fever.  Eyes: Negative for visual disturbance.  Respiratory: Negative for shortness of breath.   Cardiovascular: Negative for chest pain.  Gastrointestinal: Positive for nausea. Negative for abdominal pain and vomiting.  Skin: Positive for wound (abscess).  Neurological: Positive for headaches. Negative for dizziness, syncope, speech difficulty, weakness and numbness.  All other systems reviewed and are negative.    Physical Exam Updated Vital Signs BP 115/88 (BP Location: Left Arm)   Pulse 100   Temp 98.3 F (36.8 C) (Oral)   Resp 18   Ht 5\' 10"  (1.778 m)   Wt 122.5 kg   SpO2 99%   BMI 38.74 kg/m   Physical Exam  Constitutional: She appears well-developed and well-nourished.  Non-toxic appearance. No distress.  HENT:  Head: Normocephalic and atraumatic.  Eyes: Pupils are equal, round, and reactive to light. Conjunctivae and EOM are normal. Right eye exhibits no discharge. Left eye exhibits no discharge.  No proptosis.   Neck: Normal range of motion. Neck supple. No neck rigidity.  Cardiovascular: Normal rate and regular rhythm.  Pulmonary/Chest: Effort normal and breath sounds normal. No respiratory distress. She has no wheezes. She has no rhonchi. She has no rales.  Respiration even and unlabored  Abdominal: Soft. She exhibits no distension. There is no tenderness.  Neurological: She is alert.  Clear speech.  No facial droop.  CN III through XII grossly intact.  Sensation grossly intact bilateral upper and lower extremities.  5 out of 5 symmetric grip strength.  5 out of 5 strength plantar dorsiflexion bilaterally.  Negative pronator drift.  Normal finger-nose.  Negative Romberg.  Ambulatory.  Skin: Skin is warm and dry. No rash noted.     Psychiatric: She has a normal mood and affect. Her behavior is normal.  Nursing note and vitals reviewed.    ED Treatments / Results  Labs (all labs ordered are listed, but only  abnormal results are displayed) Labs Reviewed - No data to display  EKG None  Radiology No results found.  Procedures .Marland KitchenIncision and Drainage Date/Time: 02/14/2018 11:17 AM Performed by: Amaryllis Dyke, PA-C Authorized by: Amaryllis Dyke, PA-C   Consent:    Consent obtained:  Verbal   Consent given by:  Patient   Risks discussed:  Bleeding, damage to other organs, incomplete drainage, infection and pain   Alternatives discussed:  No treatment Location:    Type:  Abscess (Pilonidal)   Location:  Anogenital   Anogenital location:  Pilonidal Pre-procedure details:    Skin preparation:  Betadine Anesthesia (see MAR for exact dosages):    Anesthesia method:  Local infiltration   Local anesthetic:  Lidocaine 1% w/o epi Procedure type:    Complexity:  Simple Procedure details:  Incision types:  Stab incision   Incision depth:  Subcutaneous   Scalpel blade:  11   Wound management:  Probed and deloculated and irrigated with saline (Sunction utilized for purulent material drainage)   Drainage:  Purulent and bloody   Drainage amount:  Copious   Wound treatment:  Wound left open   Packing materials:  1/2 in iodoform gauze Post-procedure details:    Patient tolerance of procedure:  Tolerated well, no immediate complications   (including critical care time)  Medications Ordered in ED Medications  sodium chloride 0.9 % bolus 1,000 mL (has no administration in time range)    And  0.9 %  sodium chloride infusion (has no administration in time range)  HYDROmorphone (DILAUDID) injection 1 mg (has no administration in time range)  prochlorperazine (COMPAZINE) injection 10 mg (has no administration in time range)  diphenhydrAMINE (BENADRYL) injection 12.5 mg (has no administration in time range)  lidocaine (PF) (XYLOCAINE) 1 % injection 10 mL (has no administration in time range)    Initial Impression / Assessment and Plan / ED Course  I have reviewed the triage  vital signs and the nursing notes.  Pertinent labs & imaging results that were available during my care of the patient were reviewed by me and considered in my medical decision making (see chart for details).   Patient presents with headache and abscess. Patient is nontoxic appearing, vitals WNL.   - Patient has hx of similar headaches, gradual onset with steady progression in severity, she is afebrile with no focal neuro deficits, dizziness, change in vision, proptosis, or nuchal rigidity - non concerning for [facility], ICH, ischemic CVA, dural venous sinus thrombosis, acute glaucoma, giant cell arteritis, mass, or meningitis. Patient treated for headache with migraine cocktail which she reported prior success with (we discussed increased risk of rebound with narcotics however she was adamant that prior cocktails without this were unsuccessful), she had improvement in the ER with this today.   - Patient presents to the ED with pilonidal cyst w/ abscess amenable to I&D based on exam. Procedure per note above. Given mild surrounding cellulitis will start patient on Doxycycline. Short course of tramadol for pain given allergic to NSAIDs. [place] Controlled Substance reporting System queried. Recommended application of warm compresses/soaks/flushing. Will have patient return for wound recheck & packing removal in 2 days.   I discussed treatment plan, need for follow-up, and return precautions with the patient. Provided opportunity for questions, patient confirmed understanding and is in agreement with plan.   Final Clinical Impressions(s) / ED Diagnoses   Final diagnoses:  Acute nonintractable headache, unspecified headache type  Pilonidal abscess    ED Discharge Orders         Ordered    doxycycline (VIBRAMYCIN) 100 MG capsule  2 times daily     02/14/18 1225    traMADol (ULTRAM) 50 MG tablet  Every 6 hours PRN     02/14/18 [address] [place], PA-C 02/14/18 1229      Davonna Belling, MD 02/14/18 1433

## 2018-02-14 NOTE — Discharge Instructions (Addendum)
You were seen in the ER today for a pilonidal abscess and migraine  The pilonidal area was incised and drained to help release the bacteria with packing in place We would like you to apply warm compresses and warm flushes to this area 4-5 times per day to help facilitate further draining as needed. We are also starting you on doxycycline, an antibiotic, in order to help treat the infection.   We're also sending you home with a few tablets of tramadol to help with pain. This is a controlled substance medication that has potential addicting qualities.  We recommend that you take 1 tablet every 6 hours as needed for severe pain.  Do not drive or operate heavy machinery when taking this medicine as it can be sedating. Do not drink alcohol or take other sedating medications when taking this medicine for safety reasons.  Keep this out of reach of small children.    We have prescribed you new medication(s) today. Discuss the medications prescribed today with your pharmacist as they can have adverse effects and interactions with your other medicines including over the counter and prescribed medications. Seek medical evaluation if you start to experience new or abnormal symptoms after taking one of these medicines, seek care immediately if you start to experience difficulty breathing, feeling of your throat closing, facial swelling, or rash as these could be indications of a more serious allergic reaction  We would like you to have this area rechecked with the packing removed within 48 hours- please return to the ER , go to an urgent care, or see your primary care provider for this. Return to the ER sooner for new or worsening symptoms including, but not limited to increased pain, spreading redness, fevers, inability to keep fluids down, or any other concerns that you may have.

## 2018-02-19 ENCOUNTER — Ambulatory Visit: Admitting: Family Medicine

## 2018-03-01 ENCOUNTER — Ambulatory Visit: Admitting: Family Medicine

## 2018-04-09 ENCOUNTER — Other Ambulatory Visit: Payer: Self-pay | Admitting: Endocrinology

## 2018-04-27 ENCOUNTER — Encounter: Payer: Self-pay | Admitting: Family Medicine

## 2018-07-11 ENCOUNTER — Telehealth: Payer: Self-pay | Admitting: Family Medicine

## 2018-07-11 NOTE — Telephone Encounter (Signed)
Patient last seen 01/18/2018-- was to return in 6 weeks--but did not. Called the patient today 07/11/2018 at [phone] left detailed message to inform currently we are offering Virtual visits and to please call to schedule if needing appt at this time.

## 2018-07-13 ENCOUNTER — Encounter: Payer: Self-pay | Admitting: Neurology

## 2018-07-17 ENCOUNTER — Ambulatory Visit: Admitting: Neurology

## 2018-07-20 ENCOUNTER — Encounter: Payer: Self-pay | Admitting: *Deleted

## 2018-07-20 ENCOUNTER — Encounter: Payer: Self-pay | Admitting: Family Medicine

## 2018-07-20 ENCOUNTER — Other Ambulatory Visit: Payer: Self-pay

## 2018-07-20 ENCOUNTER — Ambulatory Visit (INDEPENDENT_AMBULATORY_CARE_PROVIDER_SITE_OTHER): Admitting: Family Medicine

## 2018-07-20 DIAGNOSIS — R1310 Dysphagia, unspecified: Secondary | ICD-10-CM | POA: Diagnosis not present

## 2018-07-20 DIAGNOSIS — R1319 Other dysphagia: Secondary | ICD-10-CM

## 2018-07-20 NOTE — Progress Notes (Signed)
Chief Complaint  Patient presents with  . Follow-up    esophagus problem    Subjective: Patient is a 38 y.o. female here for an esophageal issue. Due to COVID-19 pandemic, we are interacting via web portal for an electronic face-to-face visit. I verified patient's ID using 2 identifiers. Patient agreed to proceed with visit via this method. Patient is at home, I am at office. Patient and I are present for visit.   2 mo of feeling like the upper esoph is tight and food getting stuck lower in chest. No regurgitation. Tried allergy medicine that did not help. No nausea.   ROS: GI: As noted in HPI  Past Medical History:  Diagnosis Date  . Asthma   . Depression   . GERD (gastroesophageal reflux disease)   . History of chicken pox   . History of migraine headaches   . Seizures ([place])     Objective: No conversational dyspnea Age appropriate judgment and insight Nml affect and mood  Assessment and Plan: Esophageal dysphagia - Plan: Ambulatory referral to Gastroenterology  Orders as above. Chew thoroughly and eat slowly. F/u as originally scheduled or prn.  The patient voiced understanding and agreement to the plan.  [place], DO 07/20/18  11:33 AM

## 2018-07-23 ENCOUNTER — Ambulatory Visit (INDEPENDENT_AMBULATORY_CARE_PROVIDER_SITE_OTHER): Admitting: Endocrinology

## 2018-07-23 ENCOUNTER — Encounter: Payer: Self-pay | Admitting: Endocrinology

## 2018-07-23 ENCOUNTER — Other Ambulatory Visit: Payer: Self-pay

## 2018-07-23 DIAGNOSIS — E221 Hyperprolactinemia: Secondary | ICD-10-CM | POA: Diagnosis not present

## 2018-07-23 NOTE — Progress Notes (Signed)
Subjective:    Patient ID: Amber [name], female    DOB: Apr 30, 1980, 38 y.o.   MRN: [id number]  HPI  telehealth visit today via doxy video visit.  Alternatives to telehealth are presented to this patient, and the patient agrees to the telehealth visit.   Pt is advised of the cost of the visit, and agrees to this, also.   Patient is at home, and I am at the office.   Persons attending the telehealth visit: the patient and I Pt returns for f/u of hyperprolactinemia (dx'ed 1996, in eval of primary amenorrhea; she had resection of prolactinoma in 2009, in [place], [place]; she says prolactin level normalized, and menses resumed; it recurred in 2015, but MRI was normal; she took cabergoline for a few months, but stopped, due to insurance; she is G0, but would like to start a pregnancy.  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  She has reg menses.  She is still considering a pregnancy.  She says menses are normal.   Past Medical History:  Diagnosis Date  . Asthma   . Depression   . GERD (gastroesophageal reflux disease)   . History of chicken pox   . History of migraine headaches   . Seizures ([place])     Past Surgical History:  Procedure Laterality Date  . APPENDECTOMY    . CHOLECYSTECTOMY    . Pituiary Tumor     Tumor Removal Pituitary    Social History   Socioeconomic History  . Marital status: Married    Spouse name: Not on file  . Number of children: Not on file  . Years of education: Not on file  . Highest education level: Not on file  Occupational History  . Not on file  Social Needs  . Financial resource strain: Not on file  . Food insecurity:    Worry: Not on file    Inability: Not on file  . Transportation needs:    Medical: Not on file    Non-medical: Not on file  Tobacco  Use  . Smoking status: Former Research scientist (life sciences)  . Smokeless tobacco: Never Used  Substance and Sexual Activity  . Alcohol use: No  . Drug use: No  . Sexual activity: Not on file  Lifestyle  . Physical activity:    Days per week: Not on file    Minutes per session: Not on file  . Stress: Not on file  Relationships  . Social connections:    Talks on phone: Not on file    Gets together: Not on file    Attends religious service: Not on file    Active member of club or organization: Not on file    Attends meetings of clubs or organizations: Not on file    Relationship status: Not on file  . Intimate partner violence:    Fear of current or ex partner: Not on file    Emotionally abused: Not on file    Physically abused: Not on file    Forced sexual activity: Not on file  Other Topics Concern  . Not on file  Social History Narrative  . Not on file    Current Outpatient Medications on File Prior to Visit  Medication Sig Dispense Refill  . acetaminophen (TYLENOL) 500 MG tablet Take 500 mg by mouth  as needed.    . cabergoline (DOSTINEX) 0.5 MG tablet TAKE 1 TABLET (0.5 MG TOTAL) BY MOUTH 2 (TWO) TIMES A WEEK. 24 tablet 1  . diphenhydrAMINE (BENADRYL) 50 MG tablet Take 50 mg by mouth at bedtime as needed for itching.    . famotidine (PEPCID) 20 MG tablet Take 20 mg by mouth 2 (two) times daily.    . hydrOXYzine (ATARAX/VISTARIL) 25 MG tablet TAKE 1-3 TABLETS (25-75 MG TOTAL) BY MOUTH 3 (THREE) TIMES DAILY AS NEEDED FOR ANXIETY. 270 tablet 1  . magnesium oxide (MAG-OX) 400 MG tablet Take 400 mg by mouth daily.    . Riboflavin 400 MG CAPS Take 400 mg by mouth daily.    Marland Kitchen venlafaxine XR (EFFEXOR-XR) 75 MG 24 hr capsule TAKE 1 CAPSULE (75 MG TOTAL) BY MOUTH DAILY WITH BREAKFAST. 90 capsule 2   No current facility-administered medications on file prior to visit.     Allergies  Allergen Reactions  . Aspirin   . Chlorhexidine   . Ibuprofen   . Iodine   . Nsaids Nausea And Vomiting  . Shellfish  Allergy     Tingle in mouth/throat  . Sulfa Antibiotics   . Tape Rash    Blisters     Family History  Problem Relation Age of Onset  . Diabetes Mother   . Migraines Mother   . Hyperlipidemia Mother   . Bipolar disorder Mother   . Diabetes Father   . Colon cancer Father   . Cancer Father        colon  . Hyperlipidemia Father   . Hypertension Father   . Other Neg Hx        pituitary disorder   Review of Systems Denies n/v.      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  I told her I don't feel strongly about this, due to cost and small size prior  Patient Instructions  Blood tests are requested for you today.  We'll let you know about the results.  We may need to increase the cabergoline.   If you want, we can redo the MRI when prolactin is normal.

## 2018-07-23 NOTE — Patient Instructions (Addendum)
Blood tests are requested for you today.  We'll let you know about the results.  We may need to increase the cabergoline.   If you want, we can redo the MRI when prolactin is normal.

## 2018-07-27 NOTE — Progress Notes (Signed)
Virtual Visit via Video Note The purpose of this virtual visit is to provide medical care while limiting exposure to the novel coronavirus.    Consent was obtained for video visit:  Yes Answered questions that patient had about telehealth interaction:  Yes I discussed the limitations, risks, security and privacy concerns of performing an evaluation and management service by telemedicine. I also discussed with the patient that there may be a patient responsible charge related to this service. The patient expressed understanding and agreed to proceed.  Pt location: Home Physician Location: Home Name of referring provider:  Shelda Pal* I connected with [name] at patients initiation/request on 07/30/2018 at  2:00 PM EDT by video enabled telemedicine application and verified that I am speaking with the correct person using two identifiers. 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  UPDATE: Last year, she was trying to get pregnant, so a preventative medication was not initiated.  She is not yet pregnant but still trying.  There has been no change in her migraines.  She did require ED visit on 02/14/18 which was treated with a headache cocktail of Dilaudid, Benadryl and Compazine.  Her PCP started her on venlafaxine for agoraphobia. Intensity:  severe Duration:  2 hours to all day (they last all day if she wakes up in the morning with a migraine) Frequency:  3 to 4 days a week Rescue protocol.  She takes Tylenol.  She rarely takes tramadol.  She will typically take tramadol later as a last resort. Current NSAIDS:  no Current analgesics:  Tylenol with tramadol  Current triptans:  no Current anti-emetic:  none Current muscle relaxants:  no  Current anti-anxiolytic:  hydroxyzine Current sleep aide:  none Current Antihypertensive medications:  no Current Antidepressant medications:  venlafaxine XR 75mg  daily (started for agoraphobia 4-5 months ago) Current Anticonvulsant medications:  no Current Vitamins/Herbal/Supplements:  magnesium oxide 400mg , riboflavin 400mg  Current Antihistamines/Decongestants:  Benadryl Other therapy:  no Other medication:  Cabergoline  Caffeine:  1 cup coffee daily Alcohol:  no Smoker:  no Diet:  Trying to increase water.  Little fast food.  No soda.  Eats chicken and fish. Exercise:  no Depression/anxiety:  yes Sleep hygiene:  poor  HISTORY: Migraines Onset:  Childhood Location:  Left sided Quality:  throbbing Initial intensity:  severe Aura:  no Prodrome:  Feels ill for 3 days prior Postdrome:  "hangover" effect for 1 day after Associated symptoms:  Left sided numbness of face, arm and leg with slight weakness of left arm and leg.  Nausea, photophobia, and phonophobia.  She has not had any new worse headache of her life, waking up from sleep Initial Duration:  Several hours to several days.  Over the summer, she had status migrainosis lasting 13-14 weeks. Initial Frequency:  3 to 4 days a week Initial Frequency of abortive medication: 3 to 4 days a week Triggers/aggravating factors:  Shifting positions Relieving factors:  Laying down in dark and quiet room Activity:  Aggravates.  Past NSAIDS:  Ibuprofen.  Cannot take NSAIDs due to GI bleed Past analgesics:  Tramadol (decreases intensity) Past abortive triptans/ergot:  Sumatriptan (increased headache) Past muscle relaxants:  no Past anti-emetic:  Reglan (contraindicated with cabergoline), Zofran 4mg , Promethazine Past antihypertensive medications:  Propranolol 80mg  (hypotension) Past antidepressant  medications:  Prozac Past anticonvulsant medications:  no Past vitamins/Herbal/Supplements:  Magnesium 400mg  Past  antihistamines/decongestants:  no Other past therapies:  no  Cluster Headache: Right orbital, stabbing, severe, associated with ptosis and conjunctival injection, lasting several hours and occurring 1 to 2 times a month.  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    Past Medical History: Past Medical History:  Diagnosis Date  . Asthma   . Depression   . GERD (gastroesophageal reflux disease)   . History of chicken pox   . History of migraine headaches   . Seizures ([place])     Medications: Outpatient Encounter Medications as of 07/30/2018  Medication Sig  . acetaminophen (TYLENOL) 500 MG tablet Take 500 mg by mouth as needed.  . cabergoline (DOSTINEX) 0.5 MG tablet TAKE 1 TABLET (0.5 MG TOTAL) BY MOUTH 2 (TWO) TIMES A WEEK.  . diphenhydrAMINE (BENADRYL) 50 MG tablet Take 50 mg by mouth at bedtime as needed for itching.  . famotidine (PEPCID) 20 MG tablet Take 20 mg by mouth 2 (two) times daily.  . hydrOXYzine (ATARAX/VISTARIL) 25 MG tablet TAKE 1-3 TABLETS (25-75 MG TOTAL) BY MOUTH 3 (THREE) TIMES DAILY AS NEEDED FOR ANXIETY.  . magnesium oxide (MAG-OX) 400 MG tablet Take 400 mg by mouth daily.  . Riboflavin 400 MG CAPS Take 400 mg by mouth daily.  Marland Kitchen venlafaxine XR (EFFEXOR-XR) 75 MG 24 hr capsule TAKE 1 CAPSULE (75 MG TOTAL) BY MOUTH DAILY WITH BREAKFAST.   No facility-administered encounter medications on file as of 07/30/2018.     Allergies: Allergies  Allergen Reactions  . Aspirin   . Chlorhexidine   . Ibuprofen   . Iodine   . Nsaids Nausea And Vomiting  . Shellfish Allergy     Tingle in mouth/throat  . Sulfa Antibiotics   . Tape Rash    Blisters     Family History: Family History  Problem Relation Age of Onset  . Diabetes Mother   . Migraines Mother   . Hyperlipidemia Mother   . Bipolar  disorder Mother   . Diabetes Father   . Colon cancer Father   . Cancer Father        colon  . Hyperlipidemia Father   . Hypertension Father   . Other Neg Hx        pituitary disorder    Social History: Social History   Socioeconomic History  . Marital status: Married    Spouse name: Not on file  . Number of children: Not on file  . Years of education: Not on file  . Highest education level: Not on file  Occupational History  . Not on file  Social Needs  . Financial resource strain: Not on file  . Food insecurity:    Worry: Not on file    Inability: Not on file  . Transportation needs:    Medical: Not on file    Non-medical: Not on file  Tobacco Use  . Smoking status: Former Research scientist (life sciences)  . Smokeless tobacco: Never Used  Substance and Sexual Activity  . Alcohol use: No  . Drug use: No  . Sexual activity: Not on file  Lifestyle  . Physical activity:    Days per week: Not on file    Minutes per session: Not on file  . Stress: Not on file  Relationships  .  Social connections:    Talks on phone: Not on file    Gets together: Not on file    Attends religious service: Not on file    Active member of club or organization: Not on file    Attends meetings of clubs or organizations: Not on file    Relationship status: Not on file  . Intimate partner violence:    Fear of current or ex partner: Not on file    Emotionally abused: Not on file    Physically abused: Not on file    Forced sexual activity: Not on file  Other Topics Concern  . Not on file  Social History Narrative  . Not on file   Observations/Objective:   Blood pressure 90/63, temperature 98.3 F (36.8 C), height 5\' 6"  (1.676 m), weight 270 lb (122.5 kg). Alert and oriented.  Speech fluent and not dysarthric.  Language intact.  Eyes orthophoric on primary gaze.  Face symmetric.    Assessment and Plan:   Hemiplegic migraine, without status migrainosus, not intractable  1.  Will increase venlafaxine XR to  150mg  daily.  She will contact me with update in 6 to 8 weeks. 2.  Advised to take tramadol with Tylenol 650mg  when she wakes up with migraine 3.  Limit use of pain relievers to no more than 2 days out of week to prevent risk of rebound or medication-overuse headache. 4.  Keep headache diary 5.  Follow up in 3 to 4 months.  Follow Up Instructions:    -I discussed the assessment and treatment plan with the patient. The patient was provided an opportunity to ask questions and all were answered. The patient agreed with the plan and demonstrated an understanding of the instructions.   The patient was advised to call back or seek an in-person evaluation if the symptoms worsen or if the condition fails to improve as anticipated.   Dudley Major, DO

## 2018-07-30 ENCOUNTER — Encounter: Payer: Self-pay | Admitting: Neurology

## 2018-07-30 ENCOUNTER — Other Ambulatory Visit: Payer: Self-pay

## 2018-07-30 ENCOUNTER — Telehealth (INDEPENDENT_AMBULATORY_CARE_PROVIDER_SITE_OTHER): Admitting: Neurology

## 2018-07-30 VITALS — BP 90/63 | Temp 98.3°F | Ht 66.0 in | Wt 270.0 lb

## 2018-07-30 DIAGNOSIS — G43409 Hemiplegic migraine, not intractable, without status migrainosus: Secondary | ICD-10-CM

## 2018-07-30 MED ORDER — VENLAFAXINE HCL ER 75 MG PO CP24: 150.0000 mg | ORAL_CAPSULE | Freq: Every day | ORAL | 3 refills | Status: DC

## 2018-07-31 ENCOUNTER — Telehealth (INDEPENDENT_AMBULATORY_CARE_PROVIDER_SITE_OTHER): Admitting: Gastroenterology

## 2018-07-31 ENCOUNTER — Encounter: Payer: Self-pay | Admitting: Gastroenterology

## 2018-07-31 VITALS — Ht 66.0 in | Wt 255.0 lb

## 2018-07-31 DIAGNOSIS — R05 Cough: Secondary | ICD-10-CM

## 2018-07-31 DIAGNOSIS — Z800 Family history of malignant neoplasm of digestive organs: Secondary | ICD-10-CM

## 2018-07-31 DIAGNOSIS — K219 Gastro-esophageal reflux disease without esophagitis: Secondary | ICD-10-CM

## 2018-07-31 DIAGNOSIS — R1310 Dysphagia, unspecified: Secondary | ICD-10-CM

## 2018-07-31 DIAGNOSIS — Z8619 Personal history of other infectious and parasitic diseases: Secondary | ICD-10-CM

## 2018-07-31 DIAGNOSIS — R053 Chronic cough: Secondary | ICD-10-CM

## 2018-07-31 MED ORDER — PANTOPRAZOLE SODIUM 40 MG PO TBEC: 40.0000 mg | DELAYED_RELEASE_TABLET | Freq: Two times a day (BID) | ORAL | 3 refills | Status: DC

## 2018-07-31 NOTE — Patient Instructions (Addendum)
If you are age 39 or older, your body mass index should be between 23-30. Your Body mass index is 41.16 kg/m. If this is out of the aforementioned range listed, please consider follow up with your Primary Care Provider.  If you are age 76 or younger, your body mass index should be between 19-25. Your Body mass index is 41.16 kg/m. If this is out of the aformentioned range listed, please consider follow up with your Primary Care Provider.   We have sent the following medications to your pharmacy for you to pick up at your convenience: Protonix  You have been scheduled for an endoscopy. Please follow written instructions given to you at your visit today. If you use inhalers (even only as needed), please bring them with you on the day of your procedure. Your physician has requested that you go to www.startemmi.com and enter the access code given to you at your visit today. This web site gives a general overview about your procedure. However, you should still follow specific instructions given to you by our office regarding your preparation for the procedure.  To help prevent the possible spread of infection to our patients, communities, and staff; we will be implementing the following measures:  As of now we are not allowing any visitors/family members to accompany you to any upcoming appointments with [facility]. If you have any concerns about this please contact our office to discuss prior to the appointment.   Please follow up in 3-6 months.   It was a pleasure to see you today!  Vito Cirigliano, D.O.

## 2018-07-31 NOTE — Progress Notes (Signed)
Chief Complaint: Dysphagia, reflux  Referring Provider:     Shelda Pal, DO   HPI:    Due to current restrictions/limitations of in-office visits due to the COVID-19 pandemic, this scheduled clinical appointment was converted to a telehealth virtual consultation using Doximity.  -Time of medical discussion: 25 minutes -The patient did consent to this virtual visit and is aware of possible charges through their insurance for this visit.  -Names of all parties present: Amber [name] (patient), Gerrit Heck, DO, [facility] (physician) -Patient location: Home -Physician location: Office  Amber [name] is a 38 y.o. 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  She states she has intermittent "throat tightening" and increased non-productive cough, particularly at night. Points to anterior neck/suprasternal notch. Present for the last 4+ weeks or so. No previous similar sxs.  Does endorse a history of intermittent solid food dysphagia, pointing to the lower sternal border, which has been present for the last 2 months or so.  No food impactions.  Takes Pepcid 20 mg BID for reflux sxs. Reflux since age 77. Characterized by HB, regurgitation. Worse with spicy foods. Previously treated with Zantac, and changed to Pepcid recently. Controls HB, but no change in cough. Takes Tums regularly.  Otherwise, no weight loss, fever, chills, hematochezia, melena, night sweats.  EGD in high school for hematemesis in the setting of NSAIDs for pulled muscle. EGD n/f H pylori ulcer, treated w/ Abx. CCY in 2012 for GB stones.   No recent abdominal imaging for review.    FHx n/f father with CRC diagnosed in his 17's, now Stage 42 (age 77). Brother with sclerosing mesenteritis. Sister with ?splenic mass requiring splenectomy.   Past medical history,  past surgical history, social history, family history, medications, and allergies reviewed in the chart and with patient.    Past Medical History:  Diagnosis Date  . Asthma   . Depression   . GERD (gastroesophageal reflux disease)   . History of chicken pox   . History of migraine headaches   . Seizures ([place])      Past Surgical History:  Procedure Laterality Date  . APPENDECTOMY    . CHOLECYSTECTOMY    . Pituiary Tumor     Tumor Removal Pituitary   Family History  Problem Relation Age of Onset  . Diabetes Mother   . Migraines Mother   . Hyperlipidemia Mother   . Bipolar disorder Mother   . Diabetes Father   . Colon cancer Father   . Cancer Father        colon  . Hyperlipidemia Father   . Hypertension Father   . Bipolar disorder Brother   . Migraines Sister   . Other Neg Hx        pituitary disorder  . Esophageal cancer Neg Hx    Social History   Tobacco Use  . Smoking status: Former Research scientist (life sciences)  . Smokeless tobacco: Never Used  Substance Use Topics  . Alcohol use: No  . Drug use: No   Current Outpatient Medications  Medication Sig Dispense Refill  . acetaminophen (TYLENOL) 500 MG tablet Take 500 mg by mouth as needed.    . cabergoline (DOSTINEX) 0.5 MG tablet TAKE 1 TABLET (0.5 MG TOTAL) BY MOUTH 2 (TWO) TIMES A WEEK. 24 tablet 1  . diphenhydrAMINE (BENADRYL) 50 MG tablet Take 50 mg by  mouth at bedtime as needed for itching.    . famotidine (PEPCID) 20 MG tablet Take 20 mg by mouth 2 (two) times daily.    . hydrOXYzine (ATARAX/VISTARIL) 25 MG tablet TAKE 1-3 TABLETS (25-75 MG TOTAL) BY MOUTH 3 (THREE) TIMES DAILY AS NEEDED FOR ANXIETY. 270 tablet 1  . magnesium oxide (MAG-OX) 400 MG tablet Take 400 mg by mouth daily.    . Riboflavin 400 MG CAPS Take 400 mg by mouth daily.    Marland Kitchen venlafaxine XR (EFFEXOR-XR) 75 MG 24 hr capsule Take 2 capsules (150 mg total) by mouth daily with breakfast. 60 capsule 3   No current facility-administered medications for this visit.     Allergies  Allergen Reactions  . Aspirin   . Chlorhexidine   . Ibuprofen   . Iodine   . Nsaids Nausea And Vomiting  . Shellfish Allergy     Tingle in mouth/throat  . Sulfa Antibiotics   . Tape Rash    Blisters      Review of Systems: All systems reviewed and negative except where noted in HPI.     Physical Exam:    Physical exam not completed due to the nature of this telehealth communication.  Patient was otherwise alert and oriented and well communicative.   ASSESSMENT AND PLAN;   1) GERD 2) Dysphagia 3) Chronic cough  Longstanding history of reflux, incompletely controlled with H2 RA, requiring frequent use of Tums for breakthrough symptoms.  Additionally with intermittent solid food dysphagia, pointing to lower sternal border, along with "throat tightness", and suspected LPR (chronic nonproductive cough).  Will evaluate and treat as below:  -Protonix 40 mg p.o. twice daily for diagnostic and therapeutic intent.  Evaluate for clinical improvement with high-dose acid suppression, then titrate to lowest effective dose -Okay to continue Pepcid nightly - EGD to evaluate for evidence of erosive esophagitis, along with LES laxity, hiatal hernia. -EGD with dilation -BMI 41.  If plan for antireflux surgery in the future, recommendation would be for RYGB  4) Family history of colon cancer Father with CRC diagnosed in his 18s, now stage IV metastatic disease at age 33. - CRC screening to start at age 36 -She is otherwise without LGI symptoms  5) History of H. pylori: H. pylori ulcer in high school.  Treated with antimicrobial therapy - Evaluate for eradication at time of EGD as above with biopsies as appropriate  The indications, risks, and benefits of EGD were explained to the patient in detail. Risks include but are not limited to bleeding, perforation, adverse reaction to medications, and cardiopulmonary compromise. Sequelae include but are not limited to the possibility  of surgery, hositalization, and mortality. The patient verbalized understanding and wished to proceed. All questions answered, referred to scheduler. Further recommendations pending results of the exam.    Lavena Bullion, DO, FACG  07/31/2018, 2:02 PM   [place], [place]*

## 2018-08-08 ENCOUNTER — Telehealth: Payer: Self-pay | Admitting: *Deleted

## 2018-08-08 NOTE — Telephone Encounter (Signed)
Attempted to reach patient to complete pre-procedure Covid-19 screening. No answer. Left message.

## 2018-08-09 ENCOUNTER — Telehealth: Payer: Self-pay | Admitting: *Deleted

## 2018-08-09 NOTE — Telephone Encounter (Signed)
No answer for second COVID screen left message for patient regarding care partner policy and asked her to bring a mask if she has one available. SM

## 2018-08-10 ENCOUNTER — Encounter: Admitting: Gastroenterology

## 2018-08-13 ENCOUNTER — Other Ambulatory Visit: Payer: Self-pay | Admitting: Neurology

## 2018-08-13 DIAGNOSIS — G43409 Hemiplegic migraine, not intractable, without status migrainosus: Secondary | ICD-10-CM

## 2018-08-17 ENCOUNTER — Other Ambulatory Visit: Payer: Self-pay | Admitting: Neurology

## 2018-08-17 ENCOUNTER — Telehealth: Payer: Self-pay | Admitting: *Deleted

## 2018-08-17 MED ORDER — TRAMADOL HCL 50 MG PO TABS: 50.0000 mg | ORAL_TABLET | Freq: Four times a day (QID) | ORAL | 2 refills | Status: DC | PRN

## 2018-08-17 NOTE — Progress Notes (Signed)
Refilled tramadol 50mg , #30 with 2 refills.

## 2018-08-17 NOTE — Telephone Encounter (Signed)
Copied from [place] [id number]. Topic: General - Inquiry >> Aug 17, 2018  1:07 PM Mathis Bud wrote: Reason for CRM: Sharyn Lull from disability determination services called stating they mailed over paper work on may 5th.  Did not see anything in patients chart. Sharyn Lull went ahead and faxed the disability paper work to PCP.

## 2018-08-17 NOTE — Telephone Encounter (Signed)
Noted, have not seen anything.

## 2018-08-19 ENCOUNTER — Telehealth: Payer: Self-pay | Admitting: *Deleted

## 2018-08-19 NOTE — Telephone Encounter (Signed)
Called to confirm pt's appointment and she states she will have to cancel at this time.  She will call back to reschedule

## 2018-08-21 ENCOUNTER — Encounter: Admitting: Gastroenterology

## 2018-08-29 ENCOUNTER — Encounter: Payer: Self-pay | Admitting: Family Medicine

## 2018-09-05 ENCOUNTER — Other Ambulatory Visit: Payer: Self-pay

## 2018-09-05 MED ORDER — ONDANSETRON HCL 4 MG PO TABS: 4.0000 mg | ORAL_TABLET | Freq: Three times a day (TID) | ORAL | 0 refills | Status: DC | PRN

## 2018-09-10 ENCOUNTER — Encounter: Payer: Self-pay | Admitting: Family Medicine

## 2018-09-14 ENCOUNTER — Other Ambulatory Visit: Payer: Self-pay

## 2018-09-17 ENCOUNTER — Encounter: Payer: Self-pay | Admitting: Family Medicine

## 2018-09-17 ENCOUNTER — Ambulatory Visit: Admitting: Family Medicine

## 2018-09-28 ENCOUNTER — Ambulatory Visit (INDEPENDENT_AMBULATORY_CARE_PROVIDER_SITE_OTHER): Admitting: Psychology

## 2018-09-28 DIAGNOSIS — F331 Major depressive disorder, recurrent, moderate: Secondary | ICD-10-CM

## 2018-10-12 ENCOUNTER — Ambulatory Visit (INDEPENDENT_AMBULATORY_CARE_PROVIDER_SITE_OTHER): Admitting: Psychology

## 2018-10-12 DIAGNOSIS — F331 Major depressive disorder, recurrent, moderate: Secondary | ICD-10-CM

## 2018-10-26 ENCOUNTER — Ambulatory Visit (INDEPENDENT_AMBULATORY_CARE_PROVIDER_SITE_OTHER): Admitting: Psychology

## 2018-10-26 DIAGNOSIS — F4322 Adjustment disorder with anxiety: Secondary | ICD-10-CM

## 2018-10-26 DIAGNOSIS — F331 Major depressive disorder, recurrent, moderate: Secondary | ICD-10-CM | POA: Diagnosis not present

## 2018-11-06 ENCOUNTER — Encounter: Payer: Self-pay | Admitting: Family Medicine

## 2018-11-08 ENCOUNTER — Other Ambulatory Visit: Payer: Self-pay

## 2018-11-09 ENCOUNTER — Ambulatory Visit: Admitting: Psychology

## 2018-11-09 ENCOUNTER — Other Ambulatory Visit: Payer: Self-pay | Admitting: Endocrinology

## 2018-11-09 ENCOUNTER — Encounter: Payer: Self-pay | Admitting: Family Medicine

## 2018-11-09 ENCOUNTER — Ambulatory Visit (HOSPITAL_BASED_OUTPATIENT_CLINIC_OR_DEPARTMENT_OTHER)
Admission: RE | Admit: 2018-11-09 | Discharge: 2018-11-09 | Disposition: A | Discharge status: Home / Self Care | Source: Ambulatory Visit | Attending: Family Medicine | Admitting: Family Medicine

## 2018-11-09 ENCOUNTER — Ambulatory Visit (INDEPENDENT_AMBULATORY_CARE_PROVIDER_SITE_OTHER): Admitting: Family Medicine

## 2018-11-09 ENCOUNTER — Other Ambulatory Visit

## 2018-11-09 VITALS — BP 108/82 | HR 103 | Temp 98.2°F | Ht 66.0 in | Wt 287.4 lb

## 2018-11-09 DIAGNOSIS — M79642 Pain in left hand: Secondary | ICD-10-CM | POA: Diagnosis present

## 2018-11-09 DIAGNOSIS — R41840 Attention and concentration deficit: Secondary | ICD-10-CM | POA: Diagnosis not present

## 2018-11-09 DIAGNOSIS — M79641 Pain in right hand: Secondary | ICD-10-CM | POA: Diagnosis present

## 2018-11-09 DIAGNOSIS — F418 Other specified anxiety disorders: Secondary | ICD-10-CM

## 2018-11-09 DIAGNOSIS — E162 Hypoglycemia, unspecified: Secondary | ICD-10-CM | POA: Diagnosis not present

## 2018-11-09 DIAGNOSIS — E221 Hyperprolactinemia: Secondary | ICD-10-CM

## 2018-11-09 DIAGNOSIS — G43809 Other migraine, not intractable, without status migrainosus: Secondary | ICD-10-CM

## 2018-11-09 MED ORDER — TOPIRAMATE 25 MG PO TABS: ORAL_TABLET | ORAL | 1 refills | Status: DC

## 2018-11-09 MED ORDER — ESCITALOPRAM OXALATE 10 MG PO TABS: 10.0000 mg | ORAL_TABLET | Freq: Every day | ORAL | 3 refills | Status: DC

## 2018-11-09 NOTE — Addendum Note (Signed)
Addended by: Caffie Pinto on: 11/09/2018 04:24 PM   Modules accepted: Orders

## 2018-11-09 NOTE — Patient Instructions (Addendum)
Give [place] 4-5 business days to get the results of your labs back.   Take 1 tab daily of Effexor for the 5 days and then take 1 tab every other day for 3 doses. You are done after this.   Don't get pregnant on this medication.  If you do not hear anything about your referral in the next 1-2 weeks, call our office and ask for an update.  I recommend getting the flu shot in mid October. This suggestion would change if the CDC comes out with a different recommendation.   Let us know if you need anything.

## 2018-11-09 NOTE — Progress Notes (Signed)
Chief Complaint  Patient presents with  . Medication Problem    labs today for endo.    Subjective: Patient is a 38 y.o. female here for a myriad of isses.  Patient follows with neurology for chronic migraines.  She was recently having issues so her Effexor dose was increased to 150 mg daily.  She has not noticed an improvement, but is willing to try something else.  She did well with propanolol in the past, but it dropped her blood pressure.  She also did well on Topamax, however we had been hesitant to prescribe this due to her being of childbearing age.  She has a very mild headache right now.  She will get around 4-5/week.  She thinks she may have ADHD and would like to have a formal evaluation.  She does follow-up with a counselor every 2 weeks.  She has a history of anxiety for which she had been taking the Effexor.  She would like to know what is replacing that as we are stopping this.  She had failed Prozac in the past as well.  She has a history of bilateral hand pain.  The swelling and all of her joints are painful in the morning.  No rashes.  Nothing has seemed to be helpful.  This is been going on for several months.  No injury or change in activity.  Over the past couple months, she has been having low sugars around 2 hours after meals.  She has no history of diabetes.  She does have a glucometer at home and it has been running in the 60s-70s.  She does feel shaky.   ROS: Heart: Denies chest pain  Lungs: Denies SOB  Neuro: +HA MSK: +hand pain Psych: +anxiety Skin: No redness Eyes: No vision changes Nose: No rhinorrhea GI: No current nausea Endo: +hypoglycemia  Past Medical History:  Diagnosis Date  . Asthma   . Depression   . GERD (gastroesophageal reflux disease)   . History of chicken pox   . History of migraine headaches   . Seizures ([facility])     Objective: BP 108/82 (BP Location: Left Arm, Patient Position: Sitting, Cuff Size: Large)   Pulse (!) 103   Temp  98.2 F (36.8 C) (Temporal)   Ht 5' 6"  (1.676 m)   Wt 287 lb 6 oz (130.4 kg)   LMP 11/08/2018   SpO2 96%   BMI 46.38 kg/m  General: Awake, appears stated age HEENT: MMM, EOMi Heart: RRR, no lower extremity edema MSK: There is no swelling, redness, excessive warmth, or tenderness to palpation over either hand; there is no deformity Lungs: CTAB, no rales, wheezes or rhonchi. No accessory muscle use Neuro: DTRs equal and symmetric throughout, no clonus, no cerebellar signs Psych: Age appropriate judgment and insight, normal affect and mood  Assessment and Plan: Hypoglycemia - Plan: Hemoglobin A1c, Proinsulin, C-peptide, Insulin, Free (Bioactive), Basic metabolic panel  Inattention - Plan: Ambulatory referral to Psychology  Other migraine without status migrainosus, not intractable - Plan: topiramate (TOPAMAX) 25 MG tablet  Bilateral hand pain - Plan: DG Hand Complete Left, DG Hand Complete Right  Anxiety with depression - Plan: escitalopram (LEXAPRO) 10 MG tablet  1-she is following with her endocrinologist, but I will order some hypoglycemia labs to start the process. 2-we will formally evaluate her inattention to see if this was related to anxiety/depression or true ADHD. 3-stop Effexor, we will wean down to 75 mg daily for 5 days and then every other day  75 mg for 3 doses.  Start Topamax increasing 25 mg every week until 50 mg twice daily. 4-check an x-ray, Tylenol, ice, consider occupational therapy if x-ray is normal. 5- replace SNRI w Lexapro, cont w counseling The patient voiced understanding and agreement to the plan.  [place], DO 11/09/18  5:11 PM

## 2018-11-10 ENCOUNTER — Other Ambulatory Visit: Payer: Self-pay | Admitting: Endocrinology

## 2018-11-10 DIAGNOSIS — E221 Hyperprolactinemia: Secondary | ICD-10-CM

## 2018-11-10 LAB — PROLACTIN: Prolactin: 43.9 ng/mL — ABNORMAL HIGH

## 2018-11-10 LAB — TSH: TSH: 0.71 mIU/L

## 2018-11-10 LAB — T4, FREE: Free T4: 1.2 ng/dL (ref 0.8–1.8)

## 2018-11-10 MED ORDER — CABERGOLINE 0.5 MG PO TABS: 1.0000 mg | ORAL_TABLET | ORAL | 1 refills | Status: DC

## 2018-11-14 ENCOUNTER — Other Ambulatory Visit: Payer: Self-pay | Admitting: Neurology

## 2018-11-15 ENCOUNTER — Telehealth: Payer: Self-pay

## 2018-11-15 NOTE — Telephone Encounter (Signed)
What specifically is the question?

## 2018-11-16 ENCOUNTER — Other Ambulatory Visit: Payer: Self-pay | Admitting: Neurology

## 2018-11-16 ENCOUNTER — Telehealth: Payer: Self-pay | Admitting: *Deleted

## 2018-11-16 DIAGNOSIS — G43709 Chronic migraine without aura, not intractable, without status migrainosus: Secondary | ICD-10-CM

## 2018-11-16 DIAGNOSIS — G43419 Hemiplegic migraine, intractable, without status migrainosus: Secondary | ICD-10-CM

## 2018-11-16 DIAGNOSIS — IMO0002 Reserved for concepts with insufficient information to code with codable children: Secondary | ICD-10-CM

## 2018-11-16 DIAGNOSIS — G43409 Hemiplegic migraine, not intractable, without status migrainosus: Secondary | ICD-10-CM

## 2018-11-16 MED ORDER — AIMOVIG 70 MG/ML ~~LOC~~ SOAJ: 70.0000 mg | SUBCUTANEOUS | 11 refills | Status: DC

## 2018-11-16 MED ORDER — TRAMADOL HCL 50 MG PO TABS: 50.0000 mg | ORAL_TABLET | Freq: Four times a day (QID) | ORAL | 2 refills | Status: DC | PRN

## 2018-11-16 NOTE — Telephone Encounter (Signed)
Patient called back and with her insurance the Aimovig is $150 dollars. She is going to go ahead and purchase it this month but would still like the PA done to see if could be cheaper in the future.

## 2018-11-16 NOTE — Telephone Encounter (Signed)
Sent script for tramadol to her pharmacy.

## 2018-11-16 NOTE — Telephone Encounter (Addendum)
Returned patient's call. In previous "my chart"  note -  MD stated if patient was no longer trying to get pregnant he would order Aimovig 70 mg [place] Q30 days. She has not been on this before and will need PA.  Patient stated she would like to start on this medication and informed her we will send it to her pharmacy ([facility] [place] on [address]).  Patient also requested refill of tramadol. Last time ordered by Dr. Tomi Likens was 5/29. Patient would like refill called in as she is out.

## 2018-11-18 LAB — INSULIN, FREE (BIOACTIVE): Insulin, Free: 80.6 u[IU]/mL — ABNORMAL HIGH (ref 1.5–14.9)

## 2018-11-18 LAB — HEMOGLOBIN A1C
Hgb A1c MFr Bld: 5.8 % of total Hgb — ABNORMAL HIGH (ref <5.7)
Mean Plasma Glucose: 120 (calc)
eAG (mmol/L): 6.6 (calc)

## 2018-11-18 LAB — BASIC METABOLIC PANEL
BUN: 11 mg/dL (ref 7–25)
CO2: 25 mmol/L (ref 20–32)
Calcium: 9.6 mg/dL (ref 8.6–10.2)
Chloride: 104 mmol/L (ref 98–110)
Creat: 0.97 mg/dL (ref 0.50–1.10)
Glucose, Bld: 122 mg/dL — ABNORMAL HIGH (ref 65–99)
Potassium: 4.4 mmol/L (ref 3.5–5.3)
Sodium: 139 mmol/L (ref 135–146)

## 2018-11-18 LAB — C-PEPTIDE: C-Peptide: 8.4 ng/mL — ABNORMAL HIGH (ref 0.80–3.85)

## 2018-11-18 LAB — PROINSULIN: Proinsulin: 306 pmol/L — ABNORMAL HIGH

## 2018-11-19 ENCOUNTER — Other Ambulatory Visit: Payer: Self-pay | Admitting: Endocrinology

## 2018-11-19 ENCOUNTER — Encounter: Payer: Self-pay | Admitting: Endocrinology

## 2018-11-19 DIAGNOSIS — E221 Hyperprolactinemia: Secondary | ICD-10-CM

## 2018-11-19 NOTE — Telephone Encounter (Signed)
Please review and advise.

## 2018-11-19 NOTE — Telephone Encounter (Signed)
She is not referring to the prolactin level, the A1C, proinsulin, insulin and c-peptide results that came back today are the ones she is referring to. She states they are all not within normal limits. Please advise

## 2018-11-19 NOTE — Telephone Encounter (Signed)
Please advise 

## 2018-11-20 ENCOUNTER — Telehealth: Payer: Self-pay | Admitting: Endocrinology

## 2018-11-20 ENCOUNTER — Encounter: Payer: Self-pay | Admitting: *Deleted

## 2018-11-20 NOTE — Telephone Encounter (Signed)
Vv is ok

## 2018-11-20 NOTE — Telephone Encounter (Signed)
Patient has scheduled a Doxy appointment for 11/23/18 at 2:15 pm. 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 If virtual appointment needs to be changed please advise.

## 2018-11-20 NOTE — Progress Notes (Addendum)
[name] (Key: Leeanne Deed) Aimovig 70MG /ML auto-injectors   Form [place] [phone] phone [phone] fax Created 22 hours ago Sent to Plan 21 hours ago Determination Favorable 1 hour ago

## 2018-11-20 NOTE — Telephone Encounter (Signed)
Please advise pt re: A1C, proinsulin, insulin and C-peptide results. She has concerns that they are normal.

## 2018-11-20 NOTE — Telephone Encounter (Signed)
Please advise 

## 2018-11-20 NOTE — Telephone Encounter (Signed)
Please refer to Dr. Ellison's response 

## 2018-11-21 ENCOUNTER — Telehealth: Payer: Self-pay | Admitting: Neurology

## 2018-11-21 NOTE — Telephone Encounter (Signed)
Patient called with questions about her medication Amiovig.

## 2018-11-23 ENCOUNTER — Encounter: Payer: Self-pay | Admitting: Endocrinology

## 2018-11-23 ENCOUNTER — Ambulatory Visit (INDEPENDENT_AMBULATORY_CARE_PROVIDER_SITE_OTHER): Admitting: Psychology

## 2018-11-23 ENCOUNTER — Other Ambulatory Visit: Payer: Self-pay

## 2018-11-23 ENCOUNTER — Ambulatory Visit (INDEPENDENT_AMBULATORY_CARE_PROVIDER_SITE_OTHER): Admitting: Endocrinology

## 2018-11-23 VITALS — Ht 66.0 in

## 2018-11-23 DIAGNOSIS — D352 Benign neoplasm of pituitary gland: Secondary | ICD-10-CM | POA: Diagnosis not present

## 2018-11-23 DIAGNOSIS — F331 Major depressive disorder, recurrent, moderate: Secondary | ICD-10-CM

## 2018-11-23 DIAGNOSIS — F4322 Adjustment disorder with anxiety: Secondary | ICD-10-CM | POA: Diagnosis not present

## 2018-11-23 MED ORDER — METFORMIN HCL 500 MG PO TABS: 500.0000 mg | ORAL_TABLET | ORAL | 3 refills | Status: DC

## 2018-11-23 NOTE — Patient Instructions (Addendum)
Please continue the same cabergoline.  I have sent a prescription to your pharmacy, for metformin. Please come back for a follow-up appointment in 2 months.

## 2018-11-23 NOTE — Telephone Encounter (Signed)
Pt requesting a different medication, two programs, does not qualify for assistance, copayment is 200.00 wants different rx. Please advise

## 2018-11-23 NOTE — Telephone Encounter (Signed)
We can start topiramate 25mg  tablet:  Start 1 tablet at bedtime for one week, then increase to 2 tablets at bedtime (#60, Refills 0).  She should contact us in 4 weeks with update and I can refill with current dose or increase it if needed.    Possible side effects may include numbness and tingling or some difficulty focusing.  However, this typically improves once she gets used to the dose.  Weight loss may be a side effect.  It may cause dehydration and there is a small risk for kidney stones, so she should make sure to stay hydrated with water during the day.  There is also a very small risk for glaucoma (very rare), so if she notices any change in your vision while taking this medication, see an ophthalmologist. And of course, she should take precautions not to get pregnant.

## 2018-11-23 NOTE — Progress Notes (Signed)
Subjective:    Patient ID: Amber [name], female    DOB: [birth year], 38 y.o.   MRN: [id number]  HPI  telehealth visit today via doxy video visit.  Alternatives to telehealth are presented to this patient, and the patient agrees to the telehealth visit. Pt is advised of the cost of the visit, and agrees to this, also.   Patient is at home, and I am at the office.   Persons attending the telehealth visit: the patient and I.  Pt was dx'ed with insulin resistance in 2020.  She recently was noted to have high insulin level.  She reports weight gain.   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  She took oral contraceptives x 1 year, 2010-2011; she did not tolerate parlodel, due to vomiting; only symptom recently has been headache (sees neurol); MRI in 2018 showed 6 x 9 mm hypoenhancing microadenoma centrally and the left side; she declined f/u MRI, due to cost).  She takes increased cabergoline now.  Since the increase, she feels no different.  No change in chronic lightheadedness.   Past Medical History:  Diagnosis Date   Asthma    Depression    GERD (gastroesophageal reflux disease)    History of chicken pox    History of migraine headaches    Seizures ([place])     Past Surgical History:  Procedure Laterality Date   APPENDECTOMY     CHOLECYSTECTOMY     Pituiary Tumor     Tumor Removal Pituitary    Social History   Socioeconomic History   Marital status: Married    Spouse name: John   Number of children: 0   Years of education: Not on file   Highest education level: 12th grade  Occupational History   Occupation: unemployed  Armed forces operational officer strain: Not on file   Food insecurity    Worry: Not on file    Inability: Not on Lexicographer needs    Medical: Not on file    Non-medical: Not on file  Tobacco Use   Smoking status: Former Smoker   Smokeless tobacco: Never Used  Substance and Sexual Activity   Alcohol use: No   Drug use: No   Sexual activity: Not on file  Lifestyle   Physical activity    Days per week: Not on file    Minutes per session: Not on file   Stress: Not on file  Relationships   Social connections    Talks on phone: Not on file    Gets together: Not on file    Attends religious service: Not on file    Active member of club or organization: Not on file    Attends meetings of clubs or organizations: Not on file    Relationship status: Not on file   Intimate partner violence    Fear of current or ex partner: Not on file    Emotionally abused: Not on file    Physically abused: Not on file    Forced sexual activity: Not on file  Other Topics Concern   Not on file  Social History Narrative   Patient is right-handed. She  lives with her husband in a 2 level home. She drinks I cup of coffee a day. She walks daily, and rides her recumbent bike QOD.    Current Outpatient Medications on File Prior to Visit  Medication Sig Dispense Refill   acetaminophen (TYLENOL) 500 MG tablet Take 500 mg by mouth as needed.     cabergoline (DOSTINEX) 0.5 MG tablet Take 2 tablets (1 mg total) by mouth 2 (two) times a week. 50 tablet 1   diphenhydrAMINE (BENADRYL) 50 MG tablet Take 50 mg by mouth at bedtime as needed for itching.     Erenumab-aooe (AIMOVIG) 70 MG/ML SOAJ Inject 70 mg into the skin every 30 (thirty) days. 1 pen 11   escitalopram (LEXAPRO) 10 MG tablet Take 1 tablet (10 mg total) by mouth daily. 30 tablet 3   famotidine (PEPCID) 20 MG tablet Take 20 mg by mouth 2 (two) times daily.     hydrOXYzine (ATARAX/VISTARIL) 25 MG tablet TAKE 1-3 TABLETS (25-75 MG TOTAL) BY MOUTH 3  (THREE) TIMES DAILY AS NEEDED FOR ANXIETY. 270 tablet 1   magnesium oxide (MAG-OX) 400 MG tablet Take 400 mg by mouth daily.     ondansetron (ZOFRAN) 4 MG tablet TAKE 1 TABLET BY MOUTH EVERY 8 HOURS AS NEEDED FOR NAUSEA AND VOMITING 20 tablet 0   pantoprazole (PROTONIX) 40 MG tablet Take 1 tablet (40 mg total) by mouth 2 (two) times daily. 60 tablet 3   Riboflavin 400 MG CAPS Take 400 mg by mouth daily.     topiramate (TOPAMAX) 25 MG tablet Take 1 tab daily for a week. Then 1 tab twice daily for a week. 2 tabs in AM and 1 in PM for 1 week then 2 tabs twice daily. 60 tablet 1   traMADol (ULTRAM) 50 MG tablet Take 1 tablet (50 mg total) by mouth every 6 (six) hours as needed. 30 tablet 2   No current facility-administered medications on file prior to visit.     Allergies  Allergen Reactions   Aspirin    Chlorhexidine    Ibuprofen    Iodine    Nsaids Nausea And Vomiting   Shellfish Allergy     Tingle in mouth/throat   Sulfa Antibiotics    Tape Rash    Blisters     Family History  Problem Relation Age of Onset   Diabetes Mother    Migraines Mother    Hyperlipidemia Mother    Bipolar disorder Mother    Diabetes Father    Colon cancer Father    Cancer Father        colon   Hyperlipidemia Father    Hypertension Father    Bipolar disorder Brother    Migraines Sister    Other Neg Hx        pituitary disorder   Esophageal cancer Neg Hx     Ht 5\' 6"  (1.676 m)    LMP 11/08/2018    BMI 46.38 kg/m    Review of Systems She has depression also.      Objective:   Physical Exam  Prolactin= Lab Results  Component Value Date   HGBA1C 5.8 (H) 11/09/2018       Assessment & Plan:  Hyperglycemic with IR, new to me.  Hyperprolactinemia: now on increased rx.   Patient Instructions  Please continue the same cabergoline.  I have sent a prescription to your pharmacy, for metformin. Please come back for a follow-up appointment in 2 months.

## 2018-11-23 NOTE — Telephone Encounter (Signed)
FYI: this patient was already started on topirmate by Dr. Marnee Guarneri, will call you in 4 weeks for update.

## 2018-11-26 ENCOUNTER — Other Ambulatory Visit: Payer: Self-pay | Admitting: Family Medicine

## 2018-11-26 DIAGNOSIS — G43809 Other migraine, not intractable, without status migrainosus: Secondary | ICD-10-CM

## 2018-11-29 ENCOUNTER — Encounter (HOSPITAL_BASED_OUTPATIENT_CLINIC_OR_DEPARTMENT_OTHER): Payer: Self-pay

## 2018-11-29 ENCOUNTER — Other Ambulatory Visit: Payer: Self-pay

## 2018-11-29 ENCOUNTER — Emergency Department (HOSPITAL_BASED_OUTPATIENT_CLINIC_OR_DEPARTMENT_OTHER)
Admission: EM | Admit: 2018-11-29 | Discharge: 2018-11-29 | Disposition: A | Discharge status: Home / Self Care | Attending: Emergency Medicine | Admitting: Emergency Medicine

## 2018-11-29 DIAGNOSIS — G43909 Migraine, unspecified, not intractable, without status migrainosus: Secondary | ICD-10-CM | POA: Insufficient documentation

## 2018-11-29 DIAGNOSIS — J45909 Unspecified asthma, uncomplicated: Secondary | ICD-10-CM | POA: Diagnosis not present

## 2018-11-29 DIAGNOSIS — Z79899 Other long term (current) drug therapy: Secondary | ICD-10-CM | POA: Insufficient documentation

## 2018-11-29 DIAGNOSIS — R599 Enlarged lymph nodes, unspecified: Secondary | ICD-10-CM | POA: Diagnosis not present

## 2018-11-29 LAB — CBG MONITORING, ED: Glucose-Capillary: 107 mg/dL — ABNORMAL HIGH (ref 70–99)

## 2018-11-29 MED ORDER — SODIUM CHLORIDE 0.9 % IV BOLUS
1000.0000 mL | Freq: Once | INTRAVENOUS | Status: AC
  Administered 2018-11-29: 1000 mL via INTRAVENOUS

## 2018-11-29 MED ORDER — PROCHLORPERAZINE EDISYLATE 10 MG/2ML IJ SOLN
10.0000 mg | Freq: Once | INTRAMUSCULAR | Status: AC
  Administered 2018-11-29: 14:00:00 10 mg via INTRAVENOUS
  Filled 2018-11-29: qty 2

## 2018-11-29 MED ORDER — DIPHENHYDRAMINE HCL 50 MG/ML IJ SOLN
25.0000 mg | Freq: Once | INTRAMUSCULAR | Status: AC
  Administered 2018-11-29: 14:00:00 25 mg via INTRAVENOUS
  Filled 2018-11-29: qty 1

## 2018-11-29 MED ORDER — HYDROMORPHONE HCL 1 MG/ML IJ SOLN
1.0000 mg | Freq: Once | INTRAMUSCULAR | Status: AC
  Administered 2018-11-29: 14:00:00 1 mg via INTRAVENOUS
  Filled 2018-11-29: qty 1

## 2018-11-29 NOTE — ED Triage Notes (Signed)
Pt c/o of "weird bump behind my ear" x 2 days-also c/o "migraine" since 4am, numbness to feet and hands x 2 days and burping "the tastes like rotten eggs" started yesterday-pt denies contact with PCP for c/o-NAD-steady gait

## 2018-11-29 NOTE — ED Provider Notes (Signed)
[facility] EMERGENCY DEPARTMENT Provider Note   CSN: [id number] Arrival date & time: 11/29/18  1238     History   Chief Complaint Chief Complaint  Patient presents with   Multiple c/o    HPI Amber [name] is a 38 y.o. female.     Amber [name] is a 38 y.o. female with a history of migraines, seizures, pituitary adenoma, hyperprolactinemia, GERD, asthma and depression, who presents to the emergency department for evaluation of a bump she noted behind her right ear 2 days ago, as well as a migraine headache.  Patient reports that she noticed a small bump behind her ear, tried to take pictures of it but could not see any changes in the skin but it is very tender to palpation, and seems to get worse the more she touches it.  She denies pain in the right ear or redness or swelling of the auricle.  No ear drainage or change in hearing.  No similar bumps or pain behind the left ear.  She reports that pain was bothering her last night and she was worried about it and it kept her awake and she subsequently developed a migraine, sleep deprivation is a common trigger for her migraines.  She reports headache feels like her typical migraine, no vision changes, no numbness weakness.  She has had some vomiting which is not uncommon for her for her migraines, she reports once she starts having vomiting she typically has to come to the emergency department for an IV headache cocktail.  She reports that they typically give IV fluids, Benadryl, Compazine and Dilaudid and this usually significantly improves her headaches.  Patient also notes that over the past few days she has had some intermittent tingling in her hands and feet.  Symptoms last only a few minutes and then go away and she has had no other associated neurologic symptoms.  Patient wonders if this could be related to her recent increase in her Topamax dose.  She was also recently diagnosed with insulin resistance and started on  metformin, no other medication changes.     Past Medical History:  Diagnosis Date   Asthma    Depression    GERD (gastroesophageal reflux disease)    History of chicken pox    History of migraine headaches    Seizures ([place])     Patient Active Problem List   Diagnosis Date Noted   Bilateral hand pain 11/09/2018   Agoraphobia 01/18/2018   Anxiety with depression 03/24/2017   Migraine 05/11/2016   Pituitary adenoma ([place]) 03/05/2016   Hyperprolactinemia ([place]) 03/02/2016    Past Surgical History:  Procedure Laterality Date   APPENDECTOMY     CHOLECYSTECTOMY     Pituiary Tumor     Tumor Removal Pituitary     OB History   No obstetric history on file.      Home Medications    Prior to Admission medications   Medication Sig Start Date End Date Taking? Authorizing Provider  acetaminophen (TYLENOL) 500 MG tablet Take 500 mg by mouth as needed.    [provider]  cabergoline (DOSTINEX) 0.5 MG tablet Take 2 tablets (1 mg total) by mouth 2 (two) times a week. 11/12/18   Renato Shin, MD  diphenhydrAMINE (BENADRYL) 50 MG tablet Take 50 mg by mouth at bedtime as needed for itching.    [provider]  Erenumab-aooe (AIMOVIG) 70 MG/ML SOAJ Inject 70 mg into the skin every 30 (thirty) days. 11/16/18   Tomi Likens,  Adam R, DO  escitalopram (LEXAPRO) 10 MG tablet Take 1 tablet (10 mg total) by mouth daily. 11/09/18   Shelda Pal, DO  famotidine (PEPCID) 20 MG tablet Take 20 mg by mouth 2 (two) times daily.    [provider]  hydrOXYzine (ATARAX/VISTARIL) 25 MG tablet TAKE 1-3 TABLETS (25-75 MG TOTAL) BY MOUTH 3 (THREE) TIMES DAILY AS NEEDED FOR ANXIETY. 01/22/18   Shelda Pal, DO  magnesium oxide (MAG-OX) 400 MG tablet Take 400 mg by mouth daily.    [provider]  metFORMIN (GLUCOPHAGE) 500 MG tablet Take 1 tablet (500 mg total) by mouth every morning. 11/23/18   Renato Shin, MD  ondansetron (ZOFRAN) 4 MG tablet  TAKE 1 TABLET BY MOUTH EVERY 8 HOURS AS NEEDED FOR NAUSEA AND VOMITING 11/15/18   Tomi Likens, Adam R, DO  pantoprazole (PROTONIX) 40 MG tablet Take 1 tablet (40 mg total) by mouth 2 (two) times daily. 07/31/18   Cirigliano, Vito V, DO  Riboflavin 400 MG CAPS Take 400 mg by mouth daily.    [provider]  topiramate (TOPAMAX) 25 MG tablet TAKE 1 TAB DAILY X 1 WEEK, THEN 1 TWICE DAILY X 1 WEEK, THEN 2 IN THE AM & 1 IN PM X 1 WEEK, THEN 2 TAB TWICE DAILY 11/27/18   Wendling, Crosby Oyster, DO  traMADol (ULTRAM) 50 MG tablet Take 1 tablet (50 mg total) by mouth every 6 (six) hours as needed. 11/16/18   Pieter Partridge, DO    Family History Family History  Problem Relation Age of Onset   Diabetes Mother    Migraines Mother    Hyperlipidemia Mother    Bipolar disorder Mother    Diabetes Father    Colon cancer Father    Cancer Father        colon   Hyperlipidemia Father    Hypertension Father    Bipolar disorder Brother    Migraines Sister    Other Neg Hx        pituitary disorder   Esophageal cancer Neg Hx     Social History Social History   Tobacco Use   Smoking status: Former Smoker   Smokeless tobacco: Never Used  Substance Use Topics   Alcohol use: No   Drug use: No     Allergies   Aspirin, Chlorhexidine, Ibuprofen, Iodine, Nsaids, Shellfish allergy, Sulfa antibiotics, and Tape   Review of Systems Review of Systems  Constitutional: Negative for chills and fever.  HENT: Negative for ear discharge, ear pain, facial swelling, hearing loss and sore throat.   Eyes: Negative for visual disturbance.  Respiratory: Negative for cough and shortness of breath.   Cardiovascular: Negative for chest pain.  Gastrointestinal: Positive for nausea and vomiting. Negative for abdominal pain.  Musculoskeletal: Negative for neck pain and neck stiffness.  Skin: Negative for color change, rash and wound.  Neurological: Positive for numbness (Numbness and paresthesias in  hands and feet) and headaches. Negative for dizziness, weakness and light-headedness.     Physical Exam Updated Vital Signs BP 115/72 (BP Location: Right Arm)    Pulse 68    Temp 98.3 F (36.8 C) (Oral)    Resp 14    Ht 5\' 6"  (1.676 m)    Wt 130.2 kg    LMP 11/08/2018    SpO2 99%    BMI 46.32 kg/m   Physical Exam Vitals signs and nursing note reviewed.  Constitutional:      General: She is not in  acute distress.    Appearance: Normal appearance. She is well-developed and normal weight. She is not ill-appearing or diaphoretic.  HENT:     Head: Normocephalic and atraumatic.     Ears:     Comments: Small palpable posterior auricular lymph node behind the right ear in the location of patient's tenderness, no overlying erythema and no other surrounding lymphadenopathy noted.  No redness or swelling over the auricle, no focal mastoid tenderness or redness, TM clear with good light reflection, no signs of infection.    Mouth/Throat:     Mouth: Mucous membranes are moist.     Pharynx: Oropharynx is clear.  Eyes:     General:        Right eye: No discharge.        Left eye: No discharge.     Extraocular Movements: Extraocular movements intact.     Conjunctiva/sclera: Conjunctivae normal.     Pupils: Pupils are equal, round, and reactive to light.  Neck:     Musculoskeletal: Neck supple.  Cardiovascular:     Rate and Rhythm: Normal rate and regular rhythm.     Heart sounds: Normal heart sounds.  Pulmonary:     Effort: Pulmonary effort is normal. No respiratory distress.     Breath sounds: Normal breath sounds.  Abdominal:     General: Abdomen is flat. Bowel sounds are normal. There is no distension.     Tenderness: There is no abdominal tenderness.  Lymphadenopathy:     Cervical: No cervical adenopathy.  Neurological:     Mental Status: She is alert.     Coordination: Coordination normal.     Comments: Speech is clear, able to follow commands CN III-XII intact Normal strength in  upper and lower extremities bilaterally including dorsiflexion and plantar flexion, strong and equal grip strength Sensation normal to light and sharp touch Moves extremities without ataxia, coordination intact  Psychiatric:        Mood and Affect: Mood normal.        Behavior: Behavior normal.      ED Treatments / Results  Labs (all labs ordered are listed, but only abnormal results are displayed) Labs Reviewed  CBG MONITORING, ED - Abnormal; Notable for the following components:      Result Value   Glucose-Capillary 107 (*)    All other components within normal limits    EKG None  Radiology No results found.  Procedures Procedures (including critical care time)  Medications Ordered in ED Medications  sodium chloride 0.9 % bolus 1,000 mL (1,000 mLs Intravenous New Bag/Given 11/29/18 1330)  diphenhydrAMINE (BENADRYL) injection 25 mg (25 mg Intravenous Given 11/29/18 1330)  prochlorperazine (COMPAZINE) injection 10 mg (10 mg Intravenous Given 11/29/18 1333)  HYDROmorphone (DILAUDID) injection 1 mg (1 mg Intravenous Given 11/29/18 1335)     Initial Impression / Assessment and Plan / ED Course  I have reviewed the triage vital signs and the nursing notes.  Pertinent labs & imaging results that were available during my care of the patient were reviewed by me and considered in my medical decision making (see chart for details).  Patient presents with a bump she noted behind her right ear 2 days ago that is tender to the touch, she reports that she kept messing with this trying to see what it was last night which made it hurt worse and then kept her from sleeping triggering a migraine.  On exam she has 1 palpable inflamed lymph node behind the right ear  that is likely the cause of her symptoms there is no overlying redness or warmth, there is no focal mastoid tenderness and no signs of otitis externa or media.  No other lymphadenopathy noted on exam.  Headache presentation is like  pts typical HA and non concerning for [facility], ICH, Meningitis, or temporal arteritis. Pt is afebrile with no focal neuro deficits, nuchal rigidity, or change in vision. Pt HA treated and improved while in ED. patient reported some intermittent paresthesias in her hands and feet that lasted only a few minutes and then go away, I suspect this is most likely related to recent increase in patient's dose of Topamax she has no current paresthesias here in the ED and has normal neuro exam, will have her follow-up with her neurologist regarding this.  Return precautions discussed.  Pt verbalizes understanding and is agreeable with plan to dc.    Final Clinical Impressions(s) / ED Diagnoses   Final diagnoses:  Migraine without status migrainosus, not intractable, unspecified migraine type  Enlarged lymph node    ED Discharge Orders    None       Jacqlyn Larsen, [place] 11/29/18 1430    Maudie Flakes, MD 12/03/18 1451

## 2018-11-29 NOTE — Discharge Instructions (Signed)
Please monitor the lymph node behind your right ear, this should hopefully improve on its own if it does not improve or you develop other enlarged lymph nodes please make sure you follow-up with your primary care doctor for further evaluation.  Try and leave the area alone as much as possible.  Intermittent numbness and tingling sensation in her hands and feet may be related to your Topamax, please discuss this with your neurologist.

## 2018-12-03 NOTE — Progress Notes (Deleted)
NEUROLOGY FOLLOW UP OFFICE NOTE  Amber [name] [id number]  HISTORY OF PRESENT ILLNESS: Amber [name] is a 38 year old female with pilonidal cyst with hyperprolactinemia, insulin resistance and anxiety with agoraphobia and depression who follows up for hemiplegic migraines.  UPDATE: We tried to start her on Aimovig but did not qualify for any of the copay programs and cost was too expensive.  Her PCP started her on topiramate.  She presented to the ED on 11/29/18 for onset of paresthesias, and migraine.  Paresthesias were thought to be related to increased dose of topiramate.  She was treated with a migraine cocktail.    Intensity:  severe Duration:  2 hours to all day (they last all day if she wakes up in the morning with a migraine) Frequency:  3 to 4 days a week Rescue protocol.  She takes Tylenol.  She rarely takes tramadol.  She will typically take tramadol later as a last resort. Current NSAIDS:no Current analgesics:Tylenol with tramadol  Current triptans:no Current anti-emetic:Zofran 4mg  Current muscle relaxants:no Current anti-anxiolytic:hydroxyzine Current sleep aide:none Current Antihypertensive medications:no Current Antidepressant medications:venlafaxine XR 150mg  daily Current Anticonvulsant medications:topiramate 50mg  twice daily Current Vitamins/Herbal/Supplements: magnesium oxide 400mg , riboflavin 400mg  Current Antihistamines/Decongestants: Benadryl Other therapy:no Other medication: Cabergoline  Caffeine:1 cup coffee daily Alcohol:no Smoker:no Diet:Trying to increase water. Little fast food. No soda. Eats chicken and fish. Exercise:no Depression/anxiety:yes Sleep hygiene:poor  HISTORY: Migraines Onset:  Childhood Location:Left sided Quality:throbbing Initial intensity:severe Aura:no Prodrome:Feels ill for 3 days prior Postdrome:"hangover" effect for 1 day after Associated symptoms:   Left sided numbness of face, arm and leg with slight weakness of left arm and leg. Nausea, photophobia, and phonophobia. Dizziness.  She has not had any new worse headache of her life, waking up from sleep Initial Duration:Several hours to several days. Over the summer, she had status migrainosis lasting 13-14 weeks. Initial Frequency:3 to 4 days a week Initial Frequency of abortive medication:3 to 4 days a week Triggers/aggravating factors:  Shifting positions, sleep deprivation Relieving factors:  Laying down in dark and quiet room Activity:Aggravates.  Past NSAIDS:Ibuprofen. Cannot take NSAIDs due to GI bleed Past analgesics:Tramadol (decreases intensity) Past abortive triptans/ergot:Sumatriptan (increased headache) Past muscle relaxants:no Past anti-emetic: Reglan(contraindicated with cabergoline), Zofran 4mg , Promethazine Past antihypertensive medications:Propranolol 80mg  (hypotension) Past antidepressant medications:Prozac Past anticonvulsant medications:no Past vitamins/Herbal/Supplements:Magnesium 400mg  Past antihistamines/decongestants:no Other past therapies:no  Cluster Headache: Right orbital, stabbing, severe, associated with ptosis and conjunctival injection, lasting several hours and occurring 1 to 2 times a month.  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  PAST MEDICAL HISTORY: Past Medical History:  Diagnosis Date  . Asthma   . Depression   . GERD (gastroesophageal reflux disease)   . History of chicken pox   . History of migraine headaches   . Seizures ([place])     MEDICATIONS: Current Outpatient Medications on File Prior to Visit  Medication Sig Dispense Refill  . acetaminophen (TYLENOL) 500 MG tablet Take 500 mg by mouth as needed.    . cabergoline  (DOSTINEX) 0.5 MG tablet Take 2 tablets (1 mg total) by mouth 2 (two) times a week. 50 tablet 1  . diphenhydrAMINE (BENADRYL) 50 MG tablet Take 50 mg by mouth at bedtime as needed for itching.    Eduard Roux (AIMOVIG) 70 MG/ML SOAJ Inject 70 mg into the skin every 30 (thirty) days. 1 pen  11  . escitalopram (LEXAPRO) 10 MG tablet Take 1 tablet (10 mg total) by mouth daily. 30 tablet 3  . famotidine (PEPCID) 20 MG tablet Take 20 mg by mouth 2 (two) times daily.    . hydrOXYzine (ATARAX/VISTARIL) 25 MG tablet TAKE 1-3 TABLETS (25-75 MG TOTAL) BY MOUTH 3 (THREE) TIMES DAILY AS NEEDED FOR ANXIETY. 270 tablet 1  . magnesium oxide (MAG-OX) 400 MG tablet Take 400 mg by mouth daily.    . metFORMIN (GLUCOPHAGE) 500 MG tablet Take 1 tablet (500 mg total) by mouth every morning. 90 tablet 3  . ondansetron (ZOFRAN) 4 MG tablet TAKE 1 TABLET BY MOUTH EVERY 8 HOURS AS NEEDED FOR NAUSEA AND VOMITING 20 tablet 0  . pantoprazole (PROTONIX) 40 MG tablet Take 1 tablet (40 mg total) by mouth 2 (two) times daily. 60 tablet 3  . Riboflavin 400 MG CAPS Take 400 mg by mouth daily.    Marland Kitchen topiramate (TOPAMAX) 25 MG tablet TAKE 1 TAB DAILY X 1 WEEK, THEN 1 TWICE DAILY X 1 WEEK, THEN 2 IN THE AM & 1 IN PM X 1 WEEK, THEN 2 TAB TWICE DAILY 60 tablet 1  . traMADol (ULTRAM) 50 MG tablet Take 1 tablet (50 mg total) by mouth every 6 (six) hours as needed. 30 tablet 2   No current facility-administered medications on file prior to visit.     ALLERGIES: Allergies  Allergen Reactions  . Aspirin   . Chlorhexidine   . Ibuprofen   . Iodine   . Nsaids Nausea And Vomiting  . Shellfish Allergy     Tingle in mouth/throat  . Sulfa Antibiotics   . Tape Rash    Blisters     FAMILY HISTORY: Family History  Problem Relation Age of Onset  . Diabetes Mother   . Migraines Mother   . Hyperlipidemia Mother   . Bipolar disorder Mother   . Diabetes Father   . Colon cancer Father   . Cancer Father        colon  .  Hyperlipidemia Father   . Hypertension Father   . Bipolar disorder Brother   . Migraines Sister   . Other Neg Hx        pituitary disorder  . Esophageal cancer Neg Hx    SOCIAL HISTORY: Social History   Socioeconomic History  . Marital status: Married    Spouse name: Jenny Reichmann  . Number of children: 0  . Years of education: Not on file  . Highest education level: 12th grade  Occupational History  . Occupation: unemployed  Social Needs  . Financial resource strain: Not on file  . Food insecurity    Worry: Not on file    Inability: Not on file  . Transportation needs    Medical: Not on file    Non-medical: Not on file  Tobacco Use  . Smoking status: Former Research scientist (life sciences)  . Smokeless tobacco: Never Used  Substance and Sexual Activity  . Alcohol use: No  . Drug use: No  . Sexual activity: Not on file  Lifestyle  . Physical activity    Days per week: Not on file    Minutes per session: Not on file  . Stress: Not on file  Relationships  . Social Herbalist on phone: Not on file    Gets together: Not on file    Attends religious service: Not on file    Active member of club or organization: Not on file  Attends meetings of clubs or organizations: Not on file    Relationship status: Not on file  . Intimate partner violence    Fear of current or ex partner: Not on file    Emotionally abused: Not on file    Physically abused: Not on file    Forced sexual activity: Not on file  Other Topics Concern  . Not on file  Social History Narrative   Patient is right-handed. She lives with her husband in a 2 level home. She drinks I cup of coffee a day. She walks daily, and rides her recumbent bike QOD.    REVIEW OF SYSTEMS: Constitutional: No fevers, chills, or sweats, no generalized fatigue, change in appetite Eyes: No visual changes, double vision, eye pain Ear, nose and throat: No hearing loss, ear pain, nasal congestion, sore throat Cardiovascular: No chest pain,  palpitations Respiratory:  No shortness of breath at rest or with exertion, wheezes GastrointestinaI: No nausea, vomiting, diarrhea, abdominal pain, fecal incontinence Genitourinary:  No dysuria, urinary retention or frequency Musculoskeletal:  No neck pain, back pain Integumentary: No rash, pruritus, skin lesions Neurological: as above Psychiatric: No depression, insomnia, anxiety Endocrine: No palpitations, fatigue, diaphoresis, mood swings, change in appetite, change in weight, increased thirst Hematologic/Lymphatic:  No purpura, petechiae. Allergic/Immunologic: no itchy/runny eyes, nasal congestion, recent allergic reactions, rashes  PHYSICAL EXAM: *** General: No acute distress.  Patient appears ***-groomed.   Head:  Normocephalic/atraumatic Eyes:  Fundi examined but not visualized Neck: supple, no paraspinal tenderness, full range of motion Heart:  Regular rate and rhythm Lungs:  Clear to auscultation bilaterally Back: No paraspinal tenderness Neurological Exam: alert and oriented to person, place, and time. Attention span and concentration intact, recent and remote memory intact, fund of knowledge intact.  Speech fluent and not dysarthric, language intact.  CN II-XII intact. Bulk and tone normal, muscle strength 5/5 throughout.  Sensation to light touch  intact.  Deep tendon reflexes 2+ throughout.  Finger to nose testing intact.  Gait normal, Romberg negative.  IMPRESSION: Hemiplegic migraine, without status migrainosus, not intractable  PLAN: 1.  For preventative management, *** 2.  For abortive therapy, *** 3.  Limit use of pain relievers to no more than 2 days out of week to prevent risk of rebound or medication-overuse headache. 4.  Keep headache diary 5.  Exercise, hydration, caffeine cessation, sleep hygiene, monitor for and avoid triggers 6.  Consider:  magnesium citrate 400mg  daily, riboflavin 400mg  daily, and coenzyme Q10 100mg  three times daily 7. Always keep in  mind that currently taking a hormone or birth control may be a possible trigger or aggravating factor for migraine. 8. Follow up ***   Metta Clines, DO  CC: Riki Sheer, DO

## 2018-12-04 ENCOUNTER — Ambulatory Visit: Admitting: Neurology

## 2018-12-09 NOTE — Progress Notes (Addendum)
Virtual Visit via Video Note The purpose of this virtual visit is to provide medical care while limiting exposure to the novel coronavirus.    Consent was obtained for video visit:  Yes Answered questions that patient had about telehealth interaction:  Yes I discussed the limitations, risks, security and privacy concerns of performing an evaluation and management service by telemedicine. I also discussed with the patient that there may be a patient responsible charge related to this service. The patient expressed understanding and agreed to proceed.  Pt location: Home Physician Location: office Name of referring provider:  Shelda Pal* I connected with Amber [name] at patients initiation/request on 12/10/2018 at  1:30 PM EDT by video enabled telemedicine application and verified that I am speaking with the correct person using two identifiers. 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  UPDATE: Last visit, venlafaxine was increased to 150mg  daily.  Due to increased migraines, she decided to discontinue trying to get pregnant so that she may better be treated for her migraines.  We tried to start [place] but copay was expensive and she was unable to qualify for assistance programs.  She was started on topiramate earlier this month.  She presented to the ED on 11/29/18 for various complaints including a tender lymph node behind her right ear that triggered a migraine.  With increase dose of topiramate, she developed paresthesias.   Intensity:  severe Duration:  Several hours but not all day Frequency:  4 to 5 days a week Rescue protocol:  Zofran, tramadol, Tylenol Current NSAIDS:no Current analgesics:Tylenol with tramadol  Current triptans:no  Current ergot:  Dostinex (for hyperprolactinemia) Current anti-emetic:Zofran 4mg  Current muscle relaxants:no Current anti-anxiolytic:hydroxyzine Current sleep aide:none Current Antihypertensive medications:no Current Antidepressant medications:venlafaxine XR 150mg  daily (initiallly for agoraphobia) Current Anticonvulsant medications:topiramate 50mg /25mg  this week, starting tomorrow supposed to start 50mg  twice daily.  Current Vitamins/Herbal/Supplements: magnesium oxide 400mg , riboflavin 400mg  Current Antihistamines/Decongestants: Benadryl Other therapy:no Other medication: Cabergoline  Caffeine:1 cup coffee daily Alcohol:no Smoker:no Diet:Trying to increase water. Little fast food. No soda. Eats chicken and fish. Exercise:no Depression/anxiety:yes Sleep hygiene:poor  HISTORY: Migraines Onset:  Childhood Location:Left sided Quality:throbbing Initial intensity:severe Aura:no Prodrome:Feels ill for 3 days prior Postdrome:"hangover" effect for 1 day after Associated symptoms:  Left sided numbness of face, arm and leg with slight weakness of left arm and leg. Nausea, photophobia, and phonophobia. She has not had any new worse headache of her life, waking up from sleep Initial Duration:Several hours to several days. Over the summer, she had status migrainosis lasting 13-14 weeks. Initial Frequency:3 to 4 days a week Initial Frequency of abortive medication:3 to 4 days a week Triggers/aggravating factors:  Sleep deprivation, shifting positions Relieving factors:  Laying down in dark and quiet room Activity:Aggravates.  Past NSAIDS:Ibuprofen. Cannot take NSAIDs due to GI bleed Past analgesics:Tramadol (decreases intensity), Excedrin Past abortive triptans/ergot:Sumatriptan (increased headache) Past muscle relaxants:no Past anti-emetic: Reglan(contraindicated with cabergoline), Zofran 4mg , Promethazine Past  antihypertensive medications:Propranolol 80mg  (hypotension) Past antidepressant medications:Prozac Past anticonvulsant medications:no Past vitamins/Herbal/Supplements:Magnesium 400mg  Past antihistamines/decongestants:no Other past therapies:no  Cluster Headache: Right orbital, stabbing, severe, associated with ptosis and conjunctival injection, lasting several hours and occurring 1 to 2 times a month.  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  Past Medical History: Past Medical History:  Diagnosis Date  . Asthma   . Depression   . GERD (gastroesophageal reflux disease)   . History of chicken pox   . History of migraine headaches   . Seizures ([place])     Medications: Outpatient Encounter Medications as of 12/10/2018  Medication Sig  . acetaminophen (TYLENOL) 500 MG tablet Take 500 mg by mouth as needed.  . cabergoline (DOSTINEX) 0.5 MG tablet Take 2 tablets (1 mg total) by mouth 2 (two) times a week.  . diphenhydrAMINE (BENADRYL) 50 MG tablet Take 50 mg by mouth at bedtime as needed for itching.  Eduard Roux (AIMOVIG) 70 MG/ML SOAJ Inject 70 mg into the skin every 30 (thirty) days.  Marland Kitchen escitalopram (LEXAPRO) 10 MG tablet Take 1 tablet (10 mg total) by mouth daily.  . famotidine (PEPCID) 20 MG tablet Take 20 mg by mouth 2 (two) times daily.  . hydrOXYzine (ATARAX/VISTARIL) 25 MG tablet TAKE 1-3 TABLETS (25-75 MG TOTAL) BY MOUTH 3 (THREE) TIMES DAILY AS NEEDED FOR ANXIETY.  . magnesium oxide (MAG-OX) 400 MG tablet Take 400 mg by mouth daily.  . metFORMIN (GLUCOPHAGE) 500 MG tablet Take 1 tablet (500 mg total) by mouth every morning.  . ondansetron (ZOFRAN) 4 MG tablet TAKE 1 TABLET BY MOUTH EVERY 8 HOURS AS NEEDED FOR NAUSEA AND VOMITING  . pantoprazole (PROTONIX) 40 MG tablet Take 1 tablet (40 mg  total) by mouth 2 (two) times daily.  . Riboflavin 400 MG CAPS Take 400 mg by mouth daily.  Marland Kitchen topiramate (TOPAMAX) 25 MG tablet TAKE 1 TAB DAILY X 1 WEEK, THEN 1 TWICE DAILY X 1 WEEK, THEN 2 IN THE AM & 1 IN PM X 1 WEEK, THEN 2 TAB TWICE DAILY  . traMADol (ULTRAM) 50 MG tablet Take 1 tablet (50 mg total) by mouth every 6 (six) hours as needed.   No facility-administered encounter medications on file as of 12/10/2018.     Allergies: Allergies  Allergen Reactions  . Aspirin   . Chlorhexidine   . Ibuprofen   . Iodine   . Nsaids Nausea And Vomiting  . Shellfish Allergy     Tingle in mouth/throat  . Sulfa Antibiotics   . Tape Rash    Blisters     Family History: Family History  Problem Relation Age of Onset  . Diabetes Mother   . Migraines Mother   . Hyperlipidemia Mother   . Bipolar disorder Mother   . Diabetes Father   . Colon cancer Father   . Cancer Father        colon  . Hyperlipidemia Father   . Hypertension Father   . Bipolar disorder Brother   . Migraines Sister   . Other Neg Hx        pituitary disorder  . Esophageal cancer Neg Hx     Social History: Social History   Socioeconomic History  . Marital status: Married    Spouse name: Jenny Reichmann  . Number of children: 0  . Years of education: Not on file  . Highest education level: 12th grade  Occupational History  . Occupation: unemployed  Social Needs  . Financial resource strain: Not on file  . Food insecurity    Worry: Not on file    Inability: Not on file  . Transportation needs    Medical: Not on file    Non-medical: Not  on file  Tobacco Use  . Smoking status: Former Research scientist (life sciences)  . Smokeless tobacco: Never Used  Substance and Sexual Activity  . Alcohol use: No  . Drug use: No  . Sexual activity: Not on file  Lifestyle  . Physical activity    Days per week: Not on file    Minutes per session: Not on file  . Stress: Not on file  Relationships  . Social Herbalist on phone: Not on file     Gets together: Not on file    Attends religious service: Not on file    Active member of club or organization: Not on file    Attends meetings of clubs or organizations: Not on file    Relationship status: Not on file  . Intimate partner violence    Fear of current or ex partner: Not on file    Emotionally abused: Not on file    Physically abused: Not on file    Forced sexual activity: Not on file  Other Topics Concern  . Not on file  Social History Narrative   Patient is right-handed. She lives with her husband in a 2 level home. She drinks I cup of coffee a day. She walks daily, and rides her recumbent bike QOD.    Observations/Objective:   There were no vitals taken for this visit. No acute distress.  Alert and oriented.  Speech fluent and not dysarthric.  Language intact.  Eyes orthophoric on primary gaze.  Face symmetric.  Assessment and Plan:   1.  Chronic migraine without aura, without status migrainosus, intractable.   2.  Hemiplegic migraine  Unfortunately, she is unable to afford Aimovig.  I would like to try Botox.  She has chronic migraines, well over 3 consecutive months of over 15 headache days a month.  She has failed multiple preventatives such as topiramate, venlafaxine and propranolol.  For abortive therapy, she has failed one triptan.  However, triptans are contraindicated with hemiplegic migraine as well as contraindicated if taken with Dostinex due to vasoconstriction properties.  She has failed over the counter analgesics such as Excedrin, Tylenol and tramadol.  NSAIDs are contraindicated due to GI bleed.  1.  For preventative management, Botox 2.  For abortive therapy, would like to try Ubrelvy 100mg .  For nausea, change to Zofran ODT 8mg . 3.  Limit use of pain relievers to no more than 2 days out of week to prevent risk of rebound or medication-overuse headache. 4.  Keep headache diary 5.  Exercise, hydration, caffeine cessation, sleep hygiene, monitor for and  avoid triggers 6.  Consider:  magnesium citrate 400mg  daily, riboflavin 400mg  daily, and coenzyme Q10 100mg  three times daily 7. Always keep in mind that currently taking a hormone or birth control may be a possible trigger or aggravating factor for migraine. 8. Follow up 4 months.   Follow Up Instructions:    -I discussed the assessment and treatment plan with the patient. The patient was provided an opportunity to ask questions and all were answered. The patient agreed with the plan and demonstrated an understanding of the instructions.   The patient was advised to call back or seek an in-person evaluation if the symptoms worsen or if the condition fails to improve as anticipated.  Dudley Major, DO

## 2018-12-10 ENCOUNTER — Encounter: Payer: Self-pay | Admitting: *Deleted

## 2018-12-10 ENCOUNTER — Encounter: Payer: Self-pay | Admitting: Neurology

## 2018-12-10 ENCOUNTER — Other Ambulatory Visit: Payer: Self-pay

## 2018-12-10 ENCOUNTER — Telehealth (INDEPENDENT_AMBULATORY_CARE_PROVIDER_SITE_OTHER): Admitting: Neurology

## 2018-12-10 DIAGNOSIS — G43419 Hemiplegic migraine, intractable, without status migrainosus: Secondary | ICD-10-CM

## 2018-12-10 DIAGNOSIS — G43719 Chronic migraine without aura, intractable, without status migrainosus: Secondary | ICD-10-CM

## 2018-12-10 MED ORDER — ONDANSETRON 8 MG PO TBDP: 8.0000 mg | ORAL_TABLET | Freq: Three times a day (TID) | ORAL | 3 refills | Status: DC | PRN

## 2018-12-10 MED ORDER — UBRELVY 100 MG PO TABS: 1.0000 | ORAL_TABLET | ORAL | 3 refills | Status: DC | PRN

## 2018-12-10 NOTE — Progress Notes (Signed)
I called [facility] [phone] spoke with Joelene Millin call interaction reference # [id number] Her insurance does not do prior authorizations they have a program that is a $50 deductible which she met already and 25% cost share of services and medications $3000 maximum out of pocket (which she is no where near). She has open access, may go to any pharmacy or doctor  She said her Tedra Coupe [id number] [facility]# [id number]  I asked if she may use the copay card and she said she does not know but... She told me to call [facility] at [phone] to see if there is a Tier reduction program or any other help I called and spoke with Leanne who said her out of pocket 25% will be $343.   Leanne insisted that [facility] would know the answer but they do not have anything that would let them know if she used a Co-Pay card or not. She said she could try.

## 2018-12-10 NOTE — Progress Notes (Addendum)
Received a 1 page fax from [phone] sttaing :  per [facility] this is approved dated 12/11/2018  This was sent to be scanned into her chart I will call her with this infermation  [name] (Key: [id number]) - MRN: [id number] Need help? Call us at [phone] Status Sent to [employer] The plan will fax you a determination, typically within 1 to 5 business days.  How do I follow up? Drug Botox 200UNIT solution Form [facility] General Form [facility] Prior Authorization Form for General Requests [id number] [phone]

## 2018-12-11 ENCOUNTER — Other Ambulatory Visit: Payer: Self-pay | Admitting: Family Medicine

## 2018-12-11 DIAGNOSIS — F418 Other specified anxiety disorders: Secondary | ICD-10-CM

## 2018-12-11 DIAGNOSIS — G43809 Other migraine, not intractable, without status migrainosus: Secondary | ICD-10-CM

## 2018-12-12 ENCOUNTER — Encounter: Payer: Self-pay | Admitting: Family Medicine

## 2018-12-12 ENCOUNTER — Telehealth: Payer: Self-pay | Admitting: *Deleted

## 2018-12-12 NOTE — Telephone Encounter (Signed)
For preventative therapy, I would continue with topiramate.  Unfortunately, I don't know what else to prescribe her for abortive headache medicine.  She has already tried Tylenol and tramadol.  She cannot take triptans due to hemiplegic migraine (she has some unilateral weakness with migraines) and she cannot take NSAIDs due to GI bleed.

## 2018-12-12 NOTE — Telephone Encounter (Signed)
I called her to let her know we received insurance approval for both Ubrelvy and Botox.   Unfortunately her insurance requires she pay 25% of cost which puts Ubrelvy at $343 and Botox most likely that or higher.  I discussed with her that the Botox is only once every three months but it is still cost prohibitive for her. She was tearful and at a bit of a loss as to how to proceed from here. I told her that I will relay this information to Dr. Tomi Likens and see if there are any less costly medications that you two can discuss to see if trying them is an option for you.  I told her that we tried the copay card, it is not valid for her type of insurance.   I spoke with [facility] and with [facility] that takes care of prescriptions for [facility] and both researched to see if there was any way to get help like Tier reduction program. [facility] does not participate in any such program. I told her we will get back to her either way once Dr. Tomi Likens has reviewed if there are any other options. She said she is trying to get disability, I told her that in the mean time we will try to get something for relief and when and if SS comes through it will be a completely different process and set of rules, options and approvals.

## 2018-12-13 NOTE — Telephone Encounter (Signed)
Called spoke with patient she was informed of provider response and understands

## 2018-12-14 ENCOUNTER — Ambulatory Visit (INDEPENDENT_AMBULATORY_CARE_PROVIDER_SITE_OTHER): Admitting: Psychology

## 2018-12-14 ENCOUNTER — Other Ambulatory Visit: Payer: Self-pay | Admitting: Family Medicine

## 2018-12-14 DIAGNOSIS — F331 Major depressive disorder, recurrent, moderate: Secondary | ICD-10-CM | POA: Diagnosis not present

## 2018-12-14 DIAGNOSIS — F4322 Adjustment disorder with anxiety: Secondary | ICD-10-CM | POA: Diagnosis not present

## 2018-12-14 MED ORDER — TOPIRAMATE 50 MG PO TABS: 50.0000 mg | ORAL_TABLET | Freq: Every day | ORAL | 3 refills | Status: DC

## 2018-12-19 ENCOUNTER — Ambulatory Visit (INDEPENDENT_AMBULATORY_CARE_PROVIDER_SITE_OTHER): Admitting: Family Medicine

## 2018-12-19 ENCOUNTER — Encounter: Payer: Self-pay | Admitting: Family Medicine

## 2018-12-19 ENCOUNTER — Other Ambulatory Visit: Payer: Self-pay

## 2018-12-19 VITALS — BP 108/76 | HR 83 | Temp 98.2°F | Ht 66.0 in | Wt 280.0 lb

## 2018-12-19 DIAGNOSIS — M79642 Pain in left hand: Secondary | ICD-10-CM

## 2018-12-19 DIAGNOSIS — M79641 Pain in right hand: Secondary | ICD-10-CM | POA: Diagnosis not present

## 2018-12-19 DIAGNOSIS — R0683 Snoring: Secondary | ICD-10-CM | POA: Diagnosis not present

## 2018-12-19 DIAGNOSIS — G4700 Insomnia, unspecified: Secondary | ICD-10-CM

## 2018-12-19 MED ORDER — QUETIAPINE FUMARATE 25 MG PO TABS: 12.5000 mg | ORAL_TABLET | Freq: Every evening | ORAL | 3 refills | Status: DC | PRN

## 2018-12-19 NOTE — Progress Notes (Signed)
Chief Complaint  Patient presents with  . Medication Problem    sleep medication    Subjective: Patient is a 38 y.o. female here for sleep issues. Due to COVID-19 pandemic, we are interacting via web portal for an electronic face-to-face visit. I verified patient's ID using 2 identifiers. Patient agreed to proceed with visit via this method. Patient is at home, I am at office. Patient and I are present for visit.   Pt has had issues w sleep, both falling asleep and staying asleep. Gets around 3-5 hrs/night, feels tired. She does snore. Used Benadryl, melatonin, Hydroxyzine. Has not been on other meds. Does not feel it is related to her anxiety.  She is a family history of mental health issues including insomnia.  Her parents are on Seroquel and it works well for them.  She also snores at night.  No reports of witnessed apneic episodes and she does not wake up gasping for air.  There is a family history of sleep apnea and her brother and father.  Hand pain is still bothersome.  Had a negative x-ray around 1 month ago.  States it will sometimes swell and turn red.  ROS: Psych: +insomnia Cardiac: no palpitations  Past Medical History:  Diagnosis Date  . Asthma   . Depression   . GERD (gastroesophageal reflux disease)   . History of chicken pox   . History of migraine headaches   . Seizures ([facility])     Objective: BP 108/76 (BP Location: Left Arm, Patient Position: Sitting, Cuff Size: Large)   Pulse 83   Temp 98.2 F (36.8 C) (Oral)   Ht 5\' 6"  (1.676 m)   Wt 280 lb (127 kg)   SpO2 98%   BMI 45.19 kg/m  No conversational dyspnea Age appropriate judgment and insight Nml affect and mood  Assessment and Plan: Insomnia, unspecified type - Plan: Ambulatory referral to Pulmonology, QUEtiapine (SEROQUEL) 25 MG tablet  Bilateral hand pain - Plan: Ambulatory referral to Sports Medicine  Snoring - Plan: Ambulatory referral to Pulmonology  Start low-dose of quetiapine.  Counseled on  sleep hygiene.  Refer to the sleep team for both insomnia management and possible sleep study given her snoring. Referred to the sports medicine team for further evaluation of hand pain. I will see her in 1 month recheck her see progress on the quetiapine. The patient voiced understanding and agreement to the plan.  [place], DO 12/19/18  12:00 PM

## 2018-12-27 ENCOUNTER — Ambulatory Visit: Admitting: Family Medicine

## 2018-12-27 NOTE — Progress Notes (Deleted)
Amber [name] - 38 y.o. female MRN [id number]  Date of birth: [birth year]  SUBJECTIVE:  Including CC & ROS.  No chief complaint on file.   Amber [name] is a 38 y.o. female that is  ***.  ***   Review of Systems  HISTORY: Past Medical, Surgical, Social, and Family History Reviewed & Updated per EMR.   Pertinent Historical Findings include:  Past Medical History:  Diagnosis Date  . Asthma   . Depression   . GERD (gastroesophageal reflux disease)   . History of chicken pox   . History of migraine headaches   . Seizures ([place])     Past Surgical History:  Procedure Laterality Date  . APPENDECTOMY    . CHOLECYSTECTOMY    . Pituiary Tumor     Tumor Removal Pituitary    Allergies  Allergen Reactions  . Aspirin   . Chlorhexidine   . Ibuprofen   . Iodine   . Nsaids Nausea And Vomiting  . Shellfish Allergy     Tingle in mouth/throat  . Sulfa Antibiotics   . Tape Rash    Blisters     Family History  Problem Relation Age of Onset  . Diabetes Mother   . Migraines Mother   . Hyperlipidemia Mother   . Bipolar disorder Mother   . Diabetes Father   . Colon cancer Father   . Cancer Father        colon  . Hyperlipidemia Father   . Hypertension Father   . Bipolar disorder Brother   . Migraines Sister   . Other Neg Hx        pituitary disorder  . Esophageal cancer Neg Hx      Social History   Socioeconomic History  . Marital status: Married    Spouse name: Jenny Reichmann  . Number of children: 0  . Years of education: Not on file  . Highest education level: 12th grade  Occupational History  . Occupation: unemployed  Social Needs  . Financial resource strain: Not on file  . Food insecurity    Worry: Not on file    Inability: Not on file  . Transportation needs    Medical: Not on file    Non-medical: Not on file  Tobacco Use  . Smoking status: Former Research scientist (life sciences)  . Smokeless tobacco: Never Used  Substance and Sexual Activity  . Alcohol use: No  . Drug  use: No  . Sexual activity: Not on file  Lifestyle  . Physical activity    Days per week: Not on file    Minutes per session: Not on file  . Stress: Not on file  Relationships  . Social Herbalist on phone: Not on file    Gets together: Not on file    Attends religious service: Not on file    Active member of club or organization: Not on file    Attends meetings of clubs or organizations: Not on file    Relationship status: Not on file  . Intimate partner violence    Fear of current or ex partner: Not on file    Emotionally abused: Not on file    Physically abused: Not on file    Forced sexual activity: Not on file  Other Topics Concern  . Not on file  Social History Narrative   Patient is right-handed. She lives with her husband in a 2 level home. She drinks I cup of coffee a day. She walks daily, and rides  her recumbent bike QOD.     PHYSICAL EXAM:  VS: There were no vitals taken for this visit. Physical Exam Gen: NAD, alert, cooperative with exam, well-appearing ENT: normal lips, normal nasal mucosa,  Eye: normal EOM, normal conjunctiva and lids CV:  no edema, +2 pedal pulses   Resp: no accessory muscle use, non-labored,  GI: no masses or tenderness, no hernia  Skin: no rashes, no areas of induration  Neuro: normal tone, normal sensation to touch Psych:  normal insight, alert and oriented MSK:  ***      ASSESSMENT & PLAN:   No problem-specific Assessment & Plan notes found for this encounter.

## 2018-12-28 ENCOUNTER — Ambulatory Visit: Admitting: Psychology

## 2018-12-31 ENCOUNTER — Encounter: Payer: Self-pay | Admitting: Family Medicine

## 2019-01-03 ENCOUNTER — Telehealth: Payer: Self-pay | Admitting: Neurology

## 2019-01-03 MED ORDER — TOPIRAMATE 25 MG PO TABS: 75.0000 mg | ORAL_TABLET | Freq: Two times a day (BID) | ORAL | 1 refills | Status: DC

## 2019-01-03 NOTE — Telephone Encounter (Signed)
Patient needs to have [place] call in more medication since we are  increasing the medication she will need 25 mg tables called into the [facility] on [address] in [place]   She has 50 mg now   She is on the topiramate

## 2019-01-03 NOTE — Telephone Encounter (Signed)
Pt aware rx sent to pharmacy.

## 2019-01-03 NOTE — Telephone Encounter (Signed)
Requested Prescriptions   Pending Prescriptions Disp Refills  . topiramate (TOPAMAX) 25 MG tablet 270 tablet 1    Sig: Take 3 tablets (75 mg total) by mouth 2 (two) times daily.   sent

## 2019-01-07 ENCOUNTER — Ambulatory Visit (INDEPENDENT_AMBULATORY_CARE_PROVIDER_SITE_OTHER): Admitting: Family Medicine

## 2019-01-07 ENCOUNTER — Encounter: Payer: Self-pay | Admitting: Family Medicine

## 2019-01-07 ENCOUNTER — Other Ambulatory Visit: Payer: Self-pay

## 2019-01-07 VITALS — BP 100/70 | HR 70 | Temp 98.0°F | Ht 66.0 in | Wt 277.0 lb

## 2019-01-07 DIAGNOSIS — G4700 Insomnia, unspecified: Secondary | ICD-10-CM | POA: Diagnosis not present

## 2019-01-07 DIAGNOSIS — M79642 Pain in left hand: Secondary | ICD-10-CM

## 2019-01-07 DIAGNOSIS — Z1159 Encounter for screening for other viral diseases: Secondary | ICD-10-CM

## 2019-01-07 DIAGNOSIS — R053 Chronic cough: Secondary | ICD-10-CM

## 2019-01-07 DIAGNOSIS — R05 Cough: Secondary | ICD-10-CM

## 2019-01-07 DIAGNOSIS — M79641 Pain in right hand: Secondary | ICD-10-CM | POA: Diagnosis not present

## 2019-01-07 DIAGNOSIS — R1310 Dysphagia, unspecified: Secondary | ICD-10-CM

## 2019-01-07 MED ORDER — QUETIAPINE FUMARATE 50 MG PO TABS: 50.0000 mg | ORAL_TABLET | Freq: Every day | ORAL | 2 refills | Status: DC

## 2019-01-07 NOTE — Telephone Encounter (Signed)
Patient returned call to the office- patient has been scheduled for COVID screening on 01/22/2019 at 12:20 pm; patient has also been scheduled on 01/24/2019 at 1:30 pm for EGD at [facility]; patient reports she is active in [facility] and will obtain prep instructions through [place]; Patient advised to call back to the office at [phone] should questions/concerns arise;  Patient verbalized understanding of information/instructions;

## 2019-01-07 NOTE — Telephone Encounter (Signed)
Left message for patient to call back to the office;  

## 2019-01-07 NOTE — Progress Notes (Signed)
Chief Complaint  Patient presents with  . Follow-up    medication    Subjective: Patient is a 38 y.o. female here for f/u insomnia. Due to COVID-19 pandemic, we are interacting via web portal for an electronic face-to-face visit. I verified patient's ID using 2 identifiers. Patient agreed to proceed with visit via this method. Patient is at home, I am at office. Patient and I are present for visit.   Patient has a history of insomnia.  She was started on Seroquel 25 mg nightly.  She was not having any improvement and increased to 50 mg nightly with our approval.  She feels this is working quite well.  She takes it regularly and is not having any side effects.  She falls asleep and stays asleep to her satisfaction.  She was referred to the sports medicine team for her hand pain.  Due to an unforeseen episode with fleas with her cat, she was unable to make that appointment.  She has been using heat/ice over the area with minimal relief.  She is unable to take oral anti-inflammatories due to side effects.  ROS: Heart: Denies chest pain  Lungs: Denies SOB   Past Medical History:  Diagnosis Date  . Asthma   . Depression   . GERD (gastroesophageal reflux disease)   . History of chicken pox   . History of migraine headaches   . Seizures ([facility])     Objective: No conversational dyspnea Age appropriate judgment and insight Nml affect and mood  Assessment and Plan: Insomnia, unspecified type  Bilateral hand pain  1-continue Seroquel at 50 mg nightly. 2-recommended Voltaren gel, which her husband has plenty of, for her hands.  I do not think the topical formulation will affect her stomach the way an oral option would. I will see her in 6 months for her annual visit or as needed. The patient voiced understanding and agreement to the plan.  [place], DO 01/07/19  4:16 PM

## 2019-01-08 ENCOUNTER — Ambulatory Visit (INDEPENDENT_AMBULATORY_CARE_PROVIDER_SITE_OTHER): Admitting: Psychology

## 2019-01-08 DIAGNOSIS — F4322 Adjustment disorder with anxiety: Secondary | ICD-10-CM

## 2019-01-08 DIAGNOSIS — F331 Major depressive disorder, recurrent, moderate: Secondary | ICD-10-CM

## 2019-01-18 ENCOUNTER — Ambulatory Visit: Payer: Self-pay | Admitting: Psychology

## 2019-01-22 ENCOUNTER — Encounter

## 2019-01-22 ENCOUNTER — Other Ambulatory Visit: Payer: Self-pay | Admitting: Gastroenterology

## 2019-01-22 LAB — SARS CORONAVIRUS 2 (TAT 6-24 HRS): SARS Coronavirus 2: NEGATIVE

## 2019-01-24 ENCOUNTER — Encounter: Payer: Self-pay | Admitting: Gastroenterology

## 2019-01-24 ENCOUNTER — Other Ambulatory Visit: Payer: Self-pay

## 2019-01-24 ENCOUNTER — Ambulatory Visit (AMBULATORY_SURGERY_CENTER): Admitting: Gastroenterology

## 2019-01-24 VITALS — BP 117/69 | HR 58 | Temp 98.2°F | Resp 16 | Ht 66.0 in | Wt 277.0 lb

## 2019-01-24 DIAGNOSIS — K2970 Gastritis, unspecified, without bleeding: Secondary | ICD-10-CM | POA: Diagnosis not present

## 2019-01-24 DIAGNOSIS — R1319 Other dysphagia: Secondary | ICD-10-CM

## 2019-01-24 DIAGNOSIS — K2990 Gastroduodenitis, unspecified, without bleeding: Secondary | ICD-10-CM | POA: Diagnosis not present

## 2019-01-24 DIAGNOSIS — K2100 Gastro-esophageal reflux disease with esophagitis, without bleeding: Secondary | ICD-10-CM | POA: Diagnosis not present

## 2019-01-24 DIAGNOSIS — R1310 Dysphagia, unspecified: Secondary | ICD-10-CM | POA: Diagnosis present

## 2019-01-24 MED ORDER — SODIUM CHLORIDE 0.9 % IV SOLN: 500.0000 mL | INTRAVENOUS | Status: DC

## 2019-01-24 NOTE — Op Note (Signed)
[place] Patient Name: Amber [name] Procedure Date: 01/24/2019 1:32 PM MRN: [id number] Endoscopist: Gerrit Heck , MD Age: 38 Referring MD:  Date of Birth: 12/25/1980 Gender: Female Account #: [id number] Procedure:                Upper GI endoscopy Indications:              Dysphagia, Suspected esophageal reflux Medicines:                Monitored Anesthesia Care Procedure:                Pre-Anesthesia Assessment:                           - Prior to the procedure, a History and Physical                            was performed, and patient medications and                            allergies were reviewed. The patient's tolerance of                            previous anesthesia was also reviewed. The risks                            and benefits of the procedure and the sedation                            options and risks were discussed with the patient.                            All questions were answered, and informed consent                            was obtained. Prior Anticoagulants: The patient has                            taken no previous anticoagulant or antiplatelet                            agents. ASA Grade Assessment: II - A patient with                            mild systemic disease. After reviewing the risks                            and benefits, the patient was deemed in                            satisfactory condition to undergo the procedure.                           After obtaining informed consent, the endoscope was  passed under direct vision. Throughout the                            procedure, the patient's blood pressure, pulse, and                            oxygen saturations were monitored continuously. The                            Endoscope was introduced through the mouth, and                            advanced to the second part of duodenum. The upper                            GI  endoscopy was accomplished without difficulty.                            The patient tolerated the procedure fairly well. Scope In: Scope Out: Findings:                 The upper third of the esophagus and middle third                            of the esophagus were normal. The scope was                            withdrawn. Dilation was performed with a Maloney                            dilator with no resistance at 30 Fr. The dilation                            site was examined following endoscope reinsertion                            and showed no bleeding, mucosal tear or                            perforation. Estimated blood loss: none.                           Circumferential salmon-colored mucosa was present                            from 40 to 41 cm. Squamous islands were present at                            39 cm. The maximum longitudinal extent of these                            circunferential esophageal mucosal changes was 1 cm  in length. Biopsies were taken with a cold forceps                            for histology. Estimated blood loss was minimal.                           Scattered moderate inflammation characterized by                            congestion (edema) and erythema was found in the                            gastric fundus, in the gastric body, at the                            incisura and in the gastric antrum. Biopsies were                            taken with a cold forceps for Helicobacter pylori                            testing. Estimated blood loss was minimal.                           The duodenal bulb, first portion of the duodenum                            and second portion of the duodenum were normal. Complications:            No immediate complications. Estimated Blood Loss:     Estimated blood loss was minimal. Impression:               - Normal upper third of esophagus and middle third                             of esophagus. Dilated with 54 Fr Maloney.                           - Salmon-colored mucosa. Biopsied.                           - Gastritis. Biopsied.                           - Normal duodenal bulb, first portion of the                            duodenum and second portion of the duodenum. Recommendation:           - Patient has a contact number available for                            emergencies. The signs and symptoms of potential  delayed complications were discussed with the                            patient. Return to normal activities tomorrow.                            Written discharge instructions were provided to the                            patient.                           - Resume previous diet.                           - Use Protonix (pantoprazole) 40 mg PO BID for 8                            weeks.                           - Await pathology results.                           - Repeat upper endoscopy for surveillance based on                            pathology results.                           - Return to GI office at appointment to be                            scheduled. Gerrit Heck, MD 01/24/2019 2:04:45 PM

## 2019-01-24 NOTE — Progress Notes (Signed)
A and O x3. Report to RN. Tolerated MAC anesthesia well.Teeth unchanged after procedure.

## 2019-01-24 NOTE — Progress Notes (Signed)
Called to room to assist during endoscopic procedure.  Patient ID and intended procedure confirmed with present staff. Received instructions for my participation in the procedure from the performing physician.  

## 2019-01-24 NOTE — Progress Notes (Signed)
Temp JB V/S [place]

## 2019-01-24 NOTE — Patient Instructions (Signed)
YOU HAD AN ENDOSCOPIC PROCEDURE TODAY AT [facility]:   Refer to the procedure report that was given to you for any specific questions about what was found during the examination.  If the procedure report does not answer your questions, please call your gastroenterologist to clarify.  If you requested that your care partner not be given the details of your procedure findings, then the procedure report has been included in a sealed envelope for you to review at your convenience later.  YOU SHOULD EXPECT: Some feelings of bloating in the abdomen. Passage of more gas than usual.  Walking can help get rid of the air that was put into your GI tract during the procedure and reduce the bloating.  Please Note:  You might notice some irritation and congestion in your nose or some drainage.  This is from the oxygen used during your procedure.  There is no need for concern and it should clear up in a day or so.  SYMPTOMS TO REPORT IMMEDIATELY:   Following upper endoscopy (EGD)  Vomiting of blood or coffee ground material  New chest pain or pain under the shoulder blades  Painful or persistently difficult swallowing  New shortness of breath  Fever of 100F or higher  Black, tarry-looking stools  For urgent or emergent issues, a gastroenterologist can be reached at any hour by calling [phone].   DIET:  FOLLOW DILATION DIET-- SEE HANDOUT!!!  Drink plenty of fluids but you should avoid alcoholic beverages for 24 hours.  ACTIVITY:  You should plan to take it easy for the rest of today and you should NOT DRIVE or use heavy machinery until tomorrow (because of the sedation medicines used during the test).    FOLLOW UP: Our staff will call the number listed on your records 48-72 hours following your procedure to check on you and address any questions or concerns that you may have regarding the information given to you following your procedure. If we do not reach you, we will leave a  message.  We will attempt to reach you two times.  During this call, we will ask if you have developed any symptoms of COVID 19. If you develop any symptoms (ie: fever, flu-like symptoms, shortness of breath, cough etc.) before then, please call [phone].  If you test positive for Covid 19 in the 2 weeks post procedure, please call and report this information to [place].    If any biopsies were taken you will be contacted by phone or by letter within the next 1-3 weeks.  Please call us at [phone] if you have not heard about the biopsies in 3 weeks.    SIGNATURES/CONFIDENTIALITY: You and/or your care partner have signed paperwork which will be entered into your electronic medical record.  These signatures attest to the fact that that the information above on your After Visit Summary has been reviewed and is understood.  Full responsibility of the confidentiality of this discharge information lies with you and/or your care-partner.  Await pathology  Follow dilation diet- see handout  Continue your normal medications, including Protonix 40 mg twice a daily for 8 weeks- take 1 tablet 30 minutes before breakfast and dinner

## 2019-01-28 ENCOUNTER — Telehealth: Payer: Self-pay | Admitting: *Deleted

## 2019-01-28 ENCOUNTER — Ambulatory Visit: Admitting: Psychology

## 2019-01-28 NOTE — Telephone Encounter (Signed)
Called the patient back to inform of Dr Cirigliano's orders. Discussed the meaning of BRAT diet and medications that the MD suggested. Patient aware of possible blood work if symptoms continue. Patient verbalized understanding to call back if symptoms persist or intensify.Patient in agreement with plan.

## 2019-01-28 NOTE — Telephone Encounter (Signed)
Recommend supportive care for now, with increased fluid intake and brat diet as tolerated. Tums or Pepto-Bismol as needed.  Continue Pepcid as already prescribed.  If diarrhea persists, can send GI PCR panel to evaluate for unrelated infectious etiology.

## 2019-01-28 NOTE — Telephone Encounter (Signed)
1. Have you developed a fever since your procedure? no  2.   Have you had an respiratory symptoms (SOB or cough) since your procedure? no  3.   Have you tested positive for COVID 19 since your procedure no  4.   Have you had any family members/close contacts diagnosed with the COVID 19 since your procedure?  no   If yes to any of these questions please route to Joylene John, RN and Alphonsa Gin, Therapist, sports.  Follow up Call-  Call back number 01/24/2019  Post procedure Call Back phone  # [phone]  Permission to leave phone message Yes  Some recent data might be hidden     Patient questions:  Do you have a fever, pain , or abdominal swelling? yes Pain Score  6  Have you tolerated food without any problems? Yes.    Have you been able to return to your normal activities? Yes.    Do you have any questions about your discharge instructions: Diet   No. Medications  No. Follow up visit  No.  Do you have questions or concerns about your Care? Yes.    Actions: * If pain score is 4 or above: Physician/ provider Notified : Gerrit Heck, DO   Patient c/o epigastric pain, 6/10 since the procedure. Patient complains of nausea, and some dysphagia still since procedure. Patient without vomiting,fever or vomiting of blood. Patient stating she has had the worst diarrhea of her life. Stating she was unable to leave her house on Friday. No diarrhea on Saturday and then diarrhea returning on Sunday. Patient stating epigastric pain 6/10 now Note to Dr Bryan Lemma.

## 2019-01-29 ENCOUNTER — Encounter: Payer: Self-pay | Admitting: Gastroenterology

## 2019-01-29 NOTE — Telephone Encounter (Signed)
Called and spoke with patient-patient informed of MD recommendations-patient is agreeable to plan of care and will go by the [facility] office and pick up samples of FD guard; patient;s appt has been moved to 02/05/2019 at 11:40am per MD request; Patient advised to call back to the office at [phone] should questions/concerns arise;  Patient verbalized understanding of information/instructions;

## 2019-02-05 ENCOUNTER — Ambulatory Visit (INDEPENDENT_AMBULATORY_CARE_PROVIDER_SITE_OTHER): Admitting: Gastroenterology

## 2019-02-05 ENCOUNTER — Encounter: Payer: Self-pay | Admitting: Gastroenterology

## 2019-02-05 ENCOUNTER — Other Ambulatory Visit: Payer: Self-pay

## 2019-02-05 VITALS — BP 106/70 | HR 74 | Temp 98.1°F | Ht 66.0 in | Wt 274.1 lb

## 2019-02-05 DIAGNOSIS — R1013 Epigastric pain: Secondary | ICD-10-CM | POA: Diagnosis not present

## 2019-02-05 DIAGNOSIS — Z800 Family history of malignant neoplasm of digestive organs: Secondary | ICD-10-CM

## 2019-02-05 DIAGNOSIS — K2970 Gastritis, unspecified, without bleeding: Secondary | ICD-10-CM | POA: Diagnosis not present

## 2019-02-05 DIAGNOSIS — R197 Diarrhea, unspecified: Secondary | ICD-10-CM | POA: Diagnosis not present

## 2019-02-05 DIAGNOSIS — Z1159 Encounter for screening for other viral diseases: Secondary | ICD-10-CM | POA: Diagnosis not present

## 2019-02-05 MED ORDER — HYOSCYAMINE SULFATE SL 0.125 MG SL SUBL: 0.1250 mg | SUBLINGUAL_TABLET | Freq: Four times a day (QID) | SUBLINGUAL | 3 refills | Status: DC | PRN

## 2019-02-05 MED ORDER — CLENPIQ 10-3.5-12 MG-GM -GM/160ML PO SOLN: 1.0000 | Freq: Once | ORAL | 0 refills | Status: AC

## 2019-02-05 NOTE — Progress Notes (Signed)
P  Chief Complaint:    Epigastric pain, nausea without emesis, diarrhea  GI History: 38 y.o. female with a history of pituitary microadenoma s/p resection then recurrence, migraines, anxiety/depression, obesity,   Takes Pepcid 20 mg BID for reflux sxs. Reflux since age 18. Characterized by HB, regurgitation. Worse with spicy foods. Previously treated with Zantac, and changed to Pepcid recently. Controls HB, but no change in cough. Takes Tums regularly.  EGD in high school for hematemesis in the setting of NSAIDs for pulled muscle. EGD n/f H pylori ulcer, treated w/ Abx. CCY in 2012 for GB stones.   FHx n/f father with CRC diagnosed in his 10's, now Stage 49 (age 72). Brother with sclerosing mesenteritis. Sister with ?splenic mass requiring splenectomy.   Endoscopic history: -EGD (01/2019, Dr. Bryan Lemma): Reflux changes in lower esophagus without intestinal metaplasia, no stricture, empiric dilation with 31 Fr Maloney, moderate Non-H. pylori gastritis.  Trialed high-dose PPI  HPI:    Patient is a 38 y.o. female presenting to the [facility]ology Clinic for follow-up.   Started on FD guard without any change in epigastric pain. No change with high dose PPI (started for gastritis and MEG pain). Pain occurs at random, worse in the AM, but can last through the day. Has implemented bland diet. Nausea but no emesis. Diarrhea, without hematochezia.   Has had intermittent diarrhea for years, typically a/w dietary indiscretion. Had started eating chia seed recently with improvement in diarrhea. Drinks plenty of water through the day.  Has not trialed any medications such as Imodium or Lomotil.  Did not endorse any LGI symptoms at the time of her initial appointment, but states this has been present and essentially unchanged for years.  Mother with IBS-D.  Father with CRC diagnosed in his 38s.  Review of systems:     No chest pain, no SOB, no fevers, no urinary sx   Past Medical History:   Diagnosis Date  . Anxiety   . Asthma   . Depression   . GERD (gastroesophageal reflux disease)   . History of chicken pox   . History of migraine headaches   . Seizures ([place])     Patient's surgical history, family medical history, social history, medications and allergies were all reviewed in Epic    Current Outpatient Medications  Medication Sig Dispense Refill  . acetaminophen (TYLENOL) 500 MG tablet Take 500 mg by mouth as needed.    . cabergoline (DOSTINEX) 0.5 MG tablet Take 2 tablets (1 mg total) by mouth 2 (two) times a week. 50 tablet 1  . diphenhydrAMINE (BENADRYL) 50 MG tablet Take 50 mg by mouth at bedtime as needed for itching.    . escitalopram (LEXAPRO) 10 MG tablet Take 1 tablet (10 mg total) by mouth daily. 30 tablet 3  . famotidine (PEPCID) 20 MG tablet Take 20 mg by mouth 2 (two) times daily.    . hydrOXYzine (ATARAX/VISTARIL) 25 MG tablet TAKE 1-3 TABLETS (25-75 MG TOTAL) BY MOUTH 3 (THREE) TIMES DAILY AS NEEDED FOR ANXIETY. 270 tablet 1  . magnesium oxide (MAG-OX) 400 MG tablet Take 400 mg by mouth daily.    . metFORMIN (GLUCOPHAGE) 500 MG tablet Take 1 tablet (500 mg total) by mouth every morning. 90 tablet 3  . ondansetron (ZOFRAN ODT) 8 MG disintegrating tablet Take 1 tablet (8 mg total) by mouth every 8 (eight) hours as needed for nausea or vomiting. 20 tablet 3  . pantoprazole (PROTONIX) 40 MG tablet Take 1 tablet (40  mg total) by mouth 2 (two) times daily. 60 tablet 3  . QUEtiapine (SEROQUEL) 50 MG tablet Take 1 tablet (50 mg total) by mouth at bedtime. 90 tablet 2  . Riboflavin 400 MG CAPS Take 400 mg by mouth daily.    Marland Kitchen topiramate (TOPAMAX) 25 MG tablet Take 3 tablets (75 mg total) by mouth 2 (two) times daily. 270 tablet 1  . traMADol (ULTRAM) 50 MG tablet Take 1 tablet (50 mg total) by mouth every 6 (six) hours as needed. 30 tablet 2   No current facility-administered medications for this visit.     Physical Exam:     BP 106/70   Pulse 74   Temp  98.1 F (36.7 C)   Ht 5\' 6"  (1.676 m)   Wt 274 lb 2 oz (124.3 kg)   LMP 01/09/2019 (Approximate)   BMI 44.24 kg/m   GENERAL:  Pleasant female in NAD PSYCH: : Cooperative, normal affect EENT:  conjunctiva pink, mucous membranes moist, neck supple without masses CARDIAC:  RRR, no murmur heard, no peripheral edema PULM: Normal respiratory effort, lungs CTA bilaterally, no wheezing ABDOMEN:  Nondistended, soft, nontender. No obvious masses, no hepatomegaly,  normal bowel sounds SKIN:  turgor, no lesions seen Musculoskeletal:  Normal muscle tone, normal strength NEURO: Alert and oriented x 3, no focal neurologic deficits   IMPRESSION and PLAN:     1) Diarrhea: -GI PCR panel, fecal fat -Trial low FODMAP diet -Add fiber supplement -Colonoscopy with random and directed biopsies -Discussed the possibility of IBS-D pending the above work-up and response to therapy -Can consider course of Imodium, Lomotil, Questran, etc. pending above work-up  2) Abdominal pain: 3) Gastritis -Resume PPI therapy for treatment of gastritis -Trial Levsin 0.125 mg prn -Can evaluate for mucosal/luminal etiology at time of colonoscopy as above  4) Family history of colon cancer Father with CRC diagnosed in his 56s, now stage IV metastatic disease at age 92. - Plan to start screening early for CRC at time of colonoscopy as above -Recommended that she talk to her 3 older siblings about CRC screening, as they are now all >32 yo      The indications, risks, and benefits of colonoscopy were explained to the patient in detail. Risks include but are not limited to bleeding, perforation, adverse reaction to medications, and cardiopulmonary compromise. Sequelae include but are not limited to the possibility of surgery, hospitalization, and mortality. The patient verbalized understanding and wished to proceed. All questions answered, referred to the scheduler and bowel prep ordered. Further recommendations pending  results of the exam.     Lavena Bullion ,DO, FACG 02/05/2019, 12:02 PM

## 2019-02-05 NOTE — Patient Instructions (Signed)
You have been scheduled for a colonoscopy. Please follow written instructions given to you at your visit today.  Please pick up your prep supplies at the pharmacy within the next 1-3 days. If you use inhalers (even only as needed), please bring them with you on the day of your procedure. Your physician has requested that you go to www.startemmi.com and enter the access code given to you at your visit today. This web site gives a general overview about your procedure. However, you should still follow specific instructions given to you by our office regarding your preparation for the procedure.  Your provider has requested that you go to the basement level for lab work at [facility] in [place] [address]. Press "B" on the elevator. The lab is located at the first door on the left as you exit the elevator.  We have sent the following medications to your pharmacy for you to pick up at your convenience: Levsin  Fiber Chart  You should be consuming 25-30g of fiber per day and drinking 8 glasses of water to help your bowels move regularly.  In the chart below you can look up how much fiber you are getting in an average day.  If you are not getting enough fiber, you should add a fiber supplement to your diet.  Examples of this include Metamucil, FiberCon and Citrucel.  These can be purchased at your local grocery store or pharmacy.      [email or web address]-[email or web address]  Please review low FODMAP diet hand out   Return to office in 3-6 months  It was a pleasure to see you today!  Vito Cirigliano, D.O.

## 2019-02-19 ENCOUNTER — Ambulatory Visit: Admitting: Gastroenterology

## 2019-02-20 ENCOUNTER — Encounter

## 2019-02-22 ENCOUNTER — Encounter: Admitting: Gastroenterology

## 2019-03-13 ENCOUNTER — Other Ambulatory Visit: Payer: Self-pay | Admitting: Gastroenterology

## 2019-03-13 ENCOUNTER — Ambulatory Visit (INDEPENDENT_AMBULATORY_CARE_PROVIDER_SITE_OTHER)

## 2019-03-13 DIAGNOSIS — Z1159 Encounter for screening for other viral diseases: Secondary | ICD-10-CM

## 2019-03-14 LAB — SARS CORONAVIRUS 2 (TAT 6-24 HRS): SARS Coronavirus 2: NEGATIVE

## 2019-03-18 ENCOUNTER — Encounter: Admitting: Gastroenterology

## 2019-04-02 ENCOUNTER — Other Ambulatory Visit: Payer: Self-pay | Admitting: Family Medicine

## 2019-04-02 ENCOUNTER — Other Ambulatory Visit: Payer: Self-pay | Admitting: Gastroenterology

## 2019-04-02 DIAGNOSIS — F418 Other specified anxiety disorders: Secondary | ICD-10-CM

## 2019-04-15 ENCOUNTER — Other Ambulatory Visit: Payer: Self-pay | Admitting: Neurology

## 2019-04-15 MED ORDER — TRAMADOL HCL 50 MG PO TABS: 50.0000 mg | ORAL_TABLET | Freq: Four times a day (QID) | ORAL | 2 refills | Status: DC | PRN

## 2019-04-15 MED ORDER — TOPIRAMATE 100 MG PO TABS: 100.0000 mg | ORAL_TABLET | Freq: Two times a day (BID) | ORAL | 3 refills | Status: DC

## 2019-05-28 ENCOUNTER — Encounter: Payer: Self-pay | Admitting: Neurology

## 2019-05-28 NOTE — Progress Notes (Signed)
Virtual Visit via Video Note The purpose of this virtual visit is to provide medical care while limiting exposure to the novel coronavirus.    Consent was obtained for video visit:  Yes.   Answered questions that patient had about telehealth interaction:  Yes.   I discussed the limitations, risks, security and privacy concerns of performing an evaluation and management service by telemedicine. I also discussed with the patient that there may be a patient responsible charge related to this service. The patient expressed understanding and agreed to proceed.  Pt location: Home Physician Location: office Name of referring provider:  Shelda Pal* I connected with Amber [name] at patients initiation/request on 05/29/2019 at 11:30 AM EST by video enabled telemedicine application and verified that I am speaking with the correct person using two identifiers. Pt MRN:  [id number] Pt DOB:  Oct 21, 1980 Video Participants:  Amber [name]   History of Present Illness:  Amber [name] is a 39 year old female withpilonidal cyst with hyperprolactinemia and anxiety with depression whofollows up for hemiplegic migraines.  UPDATE: Botox and Roselyn Meier was approved but her insurance Nurse, children's) requires her to pay 25% of the cost and she is not a candidate for the medication discount program.  Topiramate was increased to 100mg  twice daily.  With migraine today Intensity: moderate- severe Duration:  Several hours but not all day Frequency:  5 days a week Frequency of abortive: 2 to 3 days a week Rescue protocol:  Zofran, Tylenol (with tramadol if severe) Current NSAIDS:no Current analgesics:Tylenol; tramadol Current triptans:no Current ergot:  Dostinex (for hyperprolactinemia) Current anti-emetic:Zofran 4mg  Current muscle relaxants:no Current anti-anxiolytic:hydroxyzine Current sleep aide:quetiapine 50mg  Current Antihypertensive medications:no Current  Antidepressant medications:Lexapro 10mg  Current Anticonvulsant medications:topiramate 100mg  twice daily Current Vitamins/Herbal/Supplements:magnesium oxide 400mg , riboflavin 400mg  Current Antihistamines/Decongestants: Benadryl Other therapy:no  Caffeine:1 cup coffee rarely Alcohol:no Smoker:no Diet:60 oz water daily. Little fast food. No soda. Eats chicken and fish.  Not skipping meals.   Exercise:no Depression/anxiety:yes Sleep hygiene:Improved with quetiapine  HISTORY: Migraines Onset:  Childhood Location:Left sided Quality:throbbing Initial intensity:severe Aura:no Prodrome:Feels ill for 3 days prior Postdrome:"hangover" effect for 1 day after Associated symptoms: Left sided numbnessof face, arm and leg with slight weakness of left arm and leg. Nausea, photophobia, and phonophobia. She has not had any new worse headache of her life, waking up from sleep InitialDuration:Several hours to several days. Over the summer, she had status migrainosis lasting 13-14 weeks. InitialFrequency:3 to 4 days a week InitialFrequency of abortive medication:3 to 4 days a week Triggers/aggravating factors:  Sleep deprivation, shifting positions Relieving factors: Laying down in dark and quiet room Activity:Aggravates.  Past NSAIDS:Ibuprofen. Cannot take NSAIDs due to GI bleed Past analgesics:Tramadol (decreases intensity), Excedrin Past abortive triptans/ergot:Sumatriptan (increased headache) Past muscle relaxants:no Past anti-emetic: Reglan(contraindicated with cabergoline), Zofran 4mg , Promethazine Past antihypertensive medications:Propranolol 80mg  (hypotension) Past antidepressant medications:Prozac; venlafaxine XR 150mg  daily  Past anticonvulsant medications:no Past vitamins/Herbal/Supplements:no Past antihistamines/decongestants:no Other past therapies:no  Cluster Headache: Right orbital, stabbing,  severe, associated with ptosis and conjunctival injection, lasting several hours and occurring 1 to 2 times a month.  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  Past Medical History: Past Medical History:  Diagnosis Date  . Anxiety   . Asthma   . Depression   . GERD (gastroesophageal reflux disease)   . History of chicken pox   . History of migraine  headaches   . Seizures ([place])     Medications: Outpatient Encounter Medications as of 05/29/2019  Medication Sig  . acetaminophen (TYLENOL) 500 MG tablet Take 500 mg by mouth as needed.  . cabergoline (DOSTINEX) 0.5 MG tablet Take 2 tablets (1 mg total) by mouth 2 (two) times a week.  . diphenhydrAMINE (BENADRYL) 50 MG tablet Take 50 mg by mouth at bedtime as needed for itching.  . escitalopram (LEXAPRO) 10 MG tablet TAKE ONE TABLET BY MOUTH ONE TIME DAILY  . famotidine (PEPCID) 20 MG tablet Take 20 mg by mouth 2 (two) times daily.  . hydrOXYzine (ATARAX/VISTARIL) 25 MG tablet TAKE 1-3 TABLETS (25-75 MG TOTAL) BY MOUTH 3 (THREE) TIMES DAILY AS NEEDED FOR ANXIETY.  Marland Kitchen Hyoscyamine Sulfate SL (LEVSIN/SL) 0.125 MG SUBL Place 0.125 mg under the tongue every 6 (six) hours as needed.  . magnesium oxide (MAG-OX) 400 MG tablet Take 400 mg by mouth daily.  . metFORMIN (GLUCOPHAGE) 500 MG tablet Take 1 tablet (500 mg total) by mouth every morning.  . ondansetron (ZOFRAN ODT) 8 MG disintegrating tablet Take 1 tablet (8 mg total) by mouth every 8 (eight) hours as needed for nausea or vomiting.  . pantoprazole (PROTONIX) 40 MG tablet TAKE ONE TABLET BY MOUTH TWICE A DAY  . QUEtiapine (SEROQUEL) 50 MG tablet Take 1 tablet (50 mg total) by mouth at bedtime.  . Riboflavin 400 MG CAPS Take 400 mg by mouth daily.  Marland Kitchen topiramate (TOPAMAX) 100 MG tablet Take 1 tablet (100 mg total) by mouth 2  (two) times daily.  . traMADol (ULTRAM) 50 MG tablet Take 1 tablet (50 mg total) by mouth every 6 (six) hours as needed.   No facility-administered encounter medications on file as of 05/29/2019.    Allergies: Allergies  Allergen Reactions  . Aspirin   . Chlorhexidine   . Ibuprofen   . Iodine   . Nsaids Nausea And Vomiting  . Shellfish Allergy     Tingle in mouth/throat  . Sulfa Antibiotics   . Tape Rash    Blisters     Family History: Family History  Problem Relation Age of Onset  . Diabetes Mother   . Migraines Mother   . Hyperlipidemia Mother   . Bipolar disorder Mother   . Irritable bowel syndrome Mother   . Diabetes Father   . Colon cancer Father   . Cancer Father        colon  . Hyperlipidemia Father   . Hypertension Father   . Bipolar disorder Brother   . Migraines Sister   . Other Neg Hx        pituitary disorder  . Esophageal cancer Neg Hx     Social History: Social History   Socioeconomic History  . Marital status: Married    Spouse name: Jenny Reichmann  . Number of children: 0  . Years of education: Not on file  . Highest education level: 12th grade  Occupational History  . Occupation: unemployed  Tobacco Use  . Smoking status: Former Smoker    Quit date: 01/24/2016    Years since quitting: 3.3  . Smokeless tobacco: Never Used  Substance and Sexual Activity  . Alcohol use: No  . Drug use: No  . Sexual activity: Not on file  Other Topics Concern  . Not on file  Social History Narrative   Patient is right-handed. She lives with her husband in a 2 level home. She drinks I cup of coffee a day. She walks  daily, and rides her recumbent bike QOD.   Social Determinants of Health   Financial Resource Strain:   . Difficulty of Paying Living Expenses: Not on file  Food Insecurity:   . Worried About Charity fundraiser in the Last Year: Not on file  . Ran Out of Food in the Last Year: Not on file  Transportation Needs:   . Lack of Transportation  (Medical): Not on file  . Lack of Transportation (Non-Medical): Not on file  Physical Activity:   . Days of Exercise per Week: Not on file  . Minutes of Exercise per Session: Not on file  Stress:   . Feeling of Stress : Not on file  Social Connections:   . Frequency of Communication with Friends and Family: Not on file  . Frequency of Social Gatherings with Friends and Family: Not on file  . Attends Religious Services: Not on file  . Active Member of Clubs or Organizations: Not on file  . Attends Archivist Meetings: Not on file  . Marital Status: Not on file  Intimate Partner Violence:   . Fear of Current or Ex-Partner: Not on file  . Emotionally Abused: Not on file  . Physically Abused: Not on file  . Sexually Abused: Not on file    Observations/Objective:   Height 5\' 6"  (1.676 m), weight 240 lb (108.9 kg). No acute distress.  Alert and oriented.  Speech fluent and not dysarthric.  Language intact.  Eyes orthophoric on primary gaze.  Face symmetric.  Assessment and Plan:   Chronic migraine without aura, without status migrainosus, intractable.  She has failed multiple preventatives including propranolol, venlafaxine and topiramate.  Unfortunately, even if approved by her insurance, both Botox and Aimovig are expensive.  I have asked her to find out the out-of-pocket expense for Emgality or Ajovy if approved.    Otherwise,  1.  For preventative management, continue topiramate 100mg  twice daily 2.  For abortive therapy, Tylenol (with or without tramadol) 3.  Limit use of pain relievers to no more than 2 days out of week to prevent risk of rebound or medication-overuse headache. 4.  Keep headache diary 5.  Increase exercise.  Continue hydration, caffeine cessation, sleep hygiene, monitor for and avoid triggers 6.  In addition to magnesium 400mg  daily and riboflavin 400mg  daily, she will start coenzyme Q10 100mg  three times daily 7. Follow up 4 months.   Follow Up  Instructions:    -I discussed the assessment and treatment plan with the patient. The patient was provided an opportunity to ask questions and all were answered. The patient agreed with the plan and demonstrated an understanding of the instructions.   The patient was advised to call back or seek an in-person evaluation if the symptoms worsen or if the condition fails to improve as anticipated.  Dudley Major, DO

## 2019-05-29 ENCOUNTER — Telehealth: Payer: Self-pay | Admitting: Neurology

## 2019-05-29 ENCOUNTER — Other Ambulatory Visit: Payer: Self-pay

## 2019-05-29 ENCOUNTER — Encounter: Payer: Self-pay | Admitting: Neurology

## 2019-05-29 ENCOUNTER — Telehealth (INDEPENDENT_AMBULATORY_CARE_PROVIDER_SITE_OTHER): Admitting: Neurology

## 2019-05-29 VITALS — Ht 66.0 in | Wt 240.0 lb

## 2019-05-29 DIAGNOSIS — G43719 Chronic migraine without aura, intractable, without status migrainosus: Secondary | ICD-10-CM | POA: Diagnosis not present

## 2019-05-29 NOTE — Telephone Encounter (Signed)
Please send in new RX.

## 2019-05-29 NOTE — Telephone Encounter (Signed)
Patient called to let Dr Tomi Likens know that she checked the price of the emgality and she would like [place] to call her in a RX for it to the [facility] in [place]

## 2019-05-30 ENCOUNTER — Telehealth: Payer: Self-pay | Admitting: Neurology

## 2019-05-30 ENCOUNTER — Other Ambulatory Visit: Payer: Self-pay | Admitting: Neurology

## 2019-05-30 MED ORDER — EMGALITY 120 MG/ML ~~LOC~~ SOAJ: 240.0000 mg | Freq: Once | SUBCUTANEOUS | 0 refills | Status: AC

## 2019-05-30 MED ORDER — EMGALITY 120 MG/ML ~~LOC~~ SOAJ: 120.0000 mg | SUBCUTANEOUS | 11 refills | Status: DC

## 2019-05-30 NOTE — Telephone Encounter (Signed)
I called pharmacy and updated new rx

## 2019-05-30 NOTE — Telephone Encounter (Signed)
Sent in prescription.  Please remind patient that the first dose involves 2 injections (2 pens will be provided).  However, it will only be one injection every 28 days thereafter.  Most people inject in the thigh but also may inject in stomach or back of upper arm (in triceps)

## 2019-05-30 NOTE — Telephone Encounter (Signed)
Pharm called in that there was a duplicate script for patient's Emgality medication and they are needing to know which one is correct. Thanks!

## 2019-05-31 NOTE — Telephone Encounter (Signed)
Patient's pharmacy called back and requested a return call. There is still some confusion about the prescription. Please ask to speak with the pharmacist directly.

## 2019-06-03 ENCOUNTER — Telehealth: Payer: Self-pay | Admitting: Neurology

## 2019-06-03 ENCOUNTER — Telehealth: Payer: Self-pay | Admitting: Gastroenterology

## 2019-06-03 ENCOUNTER — Other Ambulatory Visit: Payer: Self-pay

## 2019-06-03 ENCOUNTER — Other Ambulatory Visit: Payer: Self-pay | Admitting: Neurology

## 2019-06-03 ENCOUNTER — Telehealth: Payer: Self-pay | Admitting: Endocrinology

## 2019-06-03 MED ORDER — TRAMADOL HCL 50 MG PO TABS: 50.0000 mg | ORAL_TABLET | Freq: Four times a day (QID) | ORAL | 2 refills | Status: DC | PRN

## 2019-06-03 MED ORDER — TOPIRAMATE 100 MG PO TABS: 100.0000 mg | ORAL_TABLET | Freq: Two times a day (BID) | ORAL | 3 refills | Status: DC

## 2019-06-03 MED ORDER — EMGALITY 120 MG/ML ~~LOC~~ SOAJ: 120.0000 mg | SUBCUTANEOUS | 11 refills | Status: DC

## 2019-06-03 NOTE — Telephone Encounter (Signed)
Patient called re: Patient has a new [facility] Pharm[facility] listed below:  [facility], [place] Phone:  [phone]  Fax:  [phone]

## 2019-06-03 NOTE — Telephone Encounter (Signed)
Patient called and said she needs tramadol 50 MG, emgality 120 MG, and topomax 100 MG sent to her new mail order pharmacy please.  [facility] Send by e-script, per pt

## 2019-06-03 NOTE — Telephone Encounter (Signed)
Patient is requesting refill on Pantoprazole also to be sent to [facility]

## 2019-06-04 ENCOUNTER — Other Ambulatory Visit: Payer: Self-pay | Admitting: Gastroenterology

## 2019-06-04 MED ORDER — PANTOPRAZOLE SODIUM 40 MG PO TBEC: 40.0000 mg | DELAYED_RELEASE_TABLET | Freq: Two times a day (BID) | ORAL | 3 refills | Status: DC

## 2019-06-04 NOTE — Telephone Encounter (Signed)
Medication sent to pharmacy  

## 2019-06-05 ENCOUNTER — Other Ambulatory Visit: Payer: Self-pay | Admitting: Neurology

## 2019-06-05 ENCOUNTER — Telehealth: Payer: Self-pay | Admitting: Neurology

## 2019-06-05 ENCOUNTER — Other Ambulatory Visit: Payer: Self-pay

## 2019-06-05 ENCOUNTER — Telehealth: Payer: Self-pay | Admitting: Family Medicine

## 2019-06-05 ENCOUNTER — Telehealth: Payer: Self-pay | Admitting: Endocrinology

## 2019-06-05 DIAGNOSIS — E221 Hyperprolactinemia: Secondary | ICD-10-CM

## 2019-06-05 DIAGNOSIS — R739 Hyperglycemia, unspecified: Secondary | ICD-10-CM

## 2019-06-05 MED ORDER — UBRELVY 100 MG PO TABS: 1.0000 | ORAL_TABLET | ORAL | 3 refills | Status: DC | PRN

## 2019-06-05 NOTE — Telephone Encounter (Signed)
No. She is wanting to get labs done at PCP. Need orders please

## 2019-06-05 NOTE — Telephone Encounter (Signed)
Patient is scheduled for next week - she did want it virtual due to the drive. She said she can get her labs done at PCP ([facility]) if we can get the lab orders in whenever Dr Loanne Drilling has a chance.

## 2019-06-05 NOTE — Addendum Note (Signed)
Addended by: Renato Shin on: 06/05/2019 12:10 PM   Modules accepted: Orders

## 2019-06-05 NOTE — Telephone Encounter (Signed)
Please advise 

## 2019-06-05 NOTE — Telephone Encounter (Signed)
Per Dr. Cordelia Pen request, pt will need to schedule an appt before refills can be authorized

## 2019-06-05 NOTE — Telephone Encounter (Signed)
Patient called in regarding needing a refill for her Roselyn Meier. She said she would like it filled through [facility]. She can get her medication for free. Thank you

## 2019-06-05 NOTE — Telephone Encounter (Signed)
1.  Please schedule f/u appt 2.  Then please refill x 1, pending that appt.  

## 2019-06-05 NOTE — Telephone Encounter (Signed)
Vv is fine.  Please refill pending that appt

## 2019-06-05 NOTE — Telephone Encounter (Signed)
MEDICATION: cabergoline, metformin  PHARMACY:   [facility] - [place], [address] Phone:  [phone]  Fax:  [phone]     IS THIS A 90 DAY SUPPLY: yes  IS PATIENT OUT OF MEDICATION: no, neither  IF NOT; HOW MUCH IS LEFT: about 2 weeks for each  LAST APPOINTMENT DATE: @3 /15/2021  NEXT APPOINTMENT DATE: @Visit  date not found  DO WE HAVE YOUR PERMISSION TO LEAVE A DETAILED MESSAGE: yes  OTHER COMMENTS:    **Let patient know to contact pharmacy at the end of the day to make sure medication is ready. **  ** Please notify patient to allow 48-72 hours to process**  **Encourage patient to contact the pharmacy for refills or they can request refills through [facility]**

## 2019-06-05 NOTE — Telephone Encounter (Signed)
Calller : [name] Telephone # [phone]  Concern : Patient states that she recently changed Pharmacies Patient is requesting that all of her medications be sent to [facility] .

## 2019-06-05 NOTE — Telephone Encounter (Signed)
done

## 2019-06-05 NOTE — Telephone Encounter (Signed)
OK, I ordered. 

## 2019-06-05 NOTE — Telephone Encounter (Signed)
Last appt date is incorrect. Pt was last seen 11/2018. Please advise about refills.

## 2019-06-05 NOTE — Telephone Encounter (Signed)
She was previously on it.  I have already refilled it (90 day supply to [facility])

## 2019-06-06 ENCOUNTER — Telehealth: Payer: Self-pay

## 2019-06-06 ENCOUNTER — Encounter: Payer: Self-pay | Admitting: Family Medicine

## 2019-06-06 NOTE — Telephone Encounter (Signed)
Patient called in to see if Dr. Nani Ravens could send in a prescription for QUEtiapine (SEROQUEL) 50 MG tablet [id number]    Please send it to [facility], [address]  [address], [place] [address]  Phone:  [phone] Fax:  [phone]  DEA #:  --

## 2019-06-07 MED ORDER — QUETIAPINE FUMARATE 50 MG PO TABS: 50.0000 mg | ORAL_TABLET | Freq: Every day | ORAL | 2 refills | Status: DC

## 2019-06-07 NOTE — Telephone Encounter (Signed)
Last Refill 01/07/2019

## 2019-06-10 ENCOUNTER — Other Ambulatory Visit (INDEPENDENT_AMBULATORY_CARE_PROVIDER_SITE_OTHER)

## 2019-06-10 ENCOUNTER — Other Ambulatory Visit: Payer: Self-pay

## 2019-06-10 ENCOUNTER — Other Ambulatory Visit: Payer: Self-pay | Admitting: Family Medicine

## 2019-06-10 ENCOUNTER — Telehealth: Payer: Self-pay | Admitting: Family Medicine

## 2019-06-10 DIAGNOSIS — E221 Hyperprolactinemia: Secondary | ICD-10-CM

## 2019-06-10 DIAGNOSIS — F418 Other specified anxiety disorders: Secondary | ICD-10-CM

## 2019-06-10 DIAGNOSIS — R739 Hyperglycemia, unspecified: Secondary | ICD-10-CM | POA: Diagnosis not present

## 2019-06-10 LAB — PROLACTIN: Prolactin: 32.8 ng/mL — ABNORMAL HIGH

## 2019-06-10 LAB — HEMOGLOBIN A1C: Hgb A1c MFr Bld: 5.4 % (ref 4.6–6.5)

## 2019-06-10 MED ORDER — ESCITALOPRAM OXALATE 10 MG PO TABS: 10.0000 mg | ORAL_TABLET | Freq: Every day | ORAL | 1 refills | Status: DC

## 2019-06-10 NOTE — Telephone Encounter (Signed)
done

## 2019-06-10 NOTE — Telephone Encounter (Signed)
Pt came in office stating is needing a refill on escitalopram (LEXAPRO) 10 MG tablet, pt would like rx sent to [facility]. Please advise.

## 2019-06-11 ENCOUNTER — Telehealth: Payer: Self-pay | Admitting: Neurology

## 2019-06-11 NOTE — Telephone Encounter (Signed)
Patient called and said she needs another prescription sent to [facility] for a loading dose of Emgality which will include 2 doses. She will just be starting this medication. She said that piece is missing from the original prescription.  Please escribe to [facility].

## 2019-06-12 ENCOUNTER — Other Ambulatory Visit: Payer: Self-pay

## 2019-06-12 ENCOUNTER — Ambulatory Visit (INDEPENDENT_AMBULATORY_CARE_PROVIDER_SITE_OTHER): Admitting: Endocrinology

## 2019-06-12 ENCOUNTER — Other Ambulatory Visit: Payer: Self-pay | Admitting: Neurology

## 2019-06-12 DIAGNOSIS — E221 Hyperprolactinemia: Secondary | ICD-10-CM

## 2019-06-12 MED ORDER — BROMOCRIPTINE MESYLATE 2.5 MG PO TABS: 1.2500 mg | ORAL_TABLET | Freq: Every day | ORAL | 3 refills | Status: DC

## 2019-06-12 MED ORDER — EMGALITY 120 MG/ML ~~LOC~~ SOAJ: 240.0000 mg | Freq: Once | SUBCUTANEOUS | 0 refills | Status: AC

## 2019-06-12 NOTE — Telephone Encounter (Signed)
I sent the script for the loading dose has been sent to [facility]

## 2019-06-12 NOTE — Progress Notes (Signed)
Subjective:    Patient ID: Amber [name], female    DOB: 09-25-1980, 39 y.o.   MRN: [id number]  HPI  telehealth visit today via doxy video visit.  Alternatives to telehealth are presented to this patient, and the patient agrees to the telehealth visit. Pt is advised of the cost of the visit, and agrees to this, also.   Patient is at home, and I am at the office.   Persons attending the telehealth visit: the patient and I.  Pt was dx'ed with insulin resistance in 2020.  She recently was noted to have high insulin level.  Pt returns for f/u of hyperprolactinemia (dx'ed 1996, in eval of primary amenorrhea; she had resection of prolactinoma in 2009, in [place], [place]; she says prolactin level normalized, and menses resumed; it recurred in 2015, but MRI was normal; she took cabergoline for a few months, but stopped, due to insurance; she is G0, but would like to start a pregnancy.  She took oral contraceptives x 1 year, 2010-2011; she did not tolerate parlodel, due to vomiting; only symptom recently has been headache (sees neurol); MRI in 2018 showed 6 x 9 mm hypoenhancing microadenoma centrally and the left side; she declined f/u MRI, due to cost).  No change in chronic lightheadedness.  She has reg menses.   Past Medical History:  Diagnosis Date  . Anxiety   . Asthma   . Depression   . GERD (gastroesophageal reflux disease)   . History of chicken pox   . History of migraine headaches   . Seizures ([place])     Past Surgical History:  Procedure Laterality Date  . APPENDECTOMY    . CHOLECYSTECTOMY    . Pituiary Tumor     Tumor Removal Pituitary    Social History   Socioeconomic History  . Marital status: Married    Spouse name: Jenny Reichmann  . Number of children: 0  . Years of education: Not on file  . Highest education level: 12th grade  Occupational History  . Occupation: unemployed  Tobacco Use  . Smoking status: Former Smoker    Quit date: 01/24/2016    Years since quitting: 3.3  .  Smokeless tobacco: Never Used  Substance and Sexual Activity  . Alcohol use: No  . Drug use: No  . Sexual activity: Not on file  Other Topics Concern  . Not on file  Social History Narrative   Patient is right-handed. She lives with her husband in a 2 level home. She drinks I cup of coffee a day. She walks daily, and rides her recumbent bike QOD.   Social Determinants of Health   Financial Resource Strain:   . Difficulty of Paying Living Expenses:   Food Insecurity:   . Worried About Charity fundraiser in the Last Year:   . Arboriculturist in the Last Year:   Transportation Needs:   . Film/video editor (Medical):   Marland Kitchen Lack of Transportation (Non-Medical):   Physical Activity:   . Days of Exercise per Week:   . Minutes of Exercise per Session:   Stress:   . Feeling of Stress :   Social Connections:   . Frequency of Communication with Friends and Family:   . Frequency of Social Gatherings with Friends and Family:   . Attends Religious Services:   . Active Member of Clubs or Organizations:   . Attends Archivist Meetings:   Marland Kitchen Marital Status:   Intimate Partner Violence:   .  Fear of Current or Ex-Partner:   . Emotionally Abused:   Marland Kitchen Physically Abused:   . Sexually Abused:     Current Outpatient Medications on File Prior to Visit  Medication Sig Dispense Refill  . acetaminophen (TYLENOL) 500 MG tablet Take 500 mg by mouth as needed.    . cabergoline (DOSTINEX) 0.5 MG tablet Take 2 tablets (1 mg total) by mouth 2 (two) times a week. 50 tablet 1  . diphenhydrAMINE (BENADRYL) 50 MG tablet Take 50 mg by mouth at bedtime as needed for itching.    . escitalopram (LEXAPRO) 10 MG tablet Take 1 tablet (10 mg total) by mouth daily. 90 tablet 1  . famotidine (PEPCID) 20 MG tablet Take 20 mg by mouth 2 (two) times daily.    . Galcanezumab-gnlm (EMGALITY) 120 MG/ML SOAJ Inject 120 mg into the skin every 28 (twenty-eight) days. 1 pen 11  . hydrOXYzine (ATARAX/VISTARIL) 25  MG tablet TAKE 1-3 TABLETS (25-75 MG TOTAL) BY MOUTH 3 (THREE) TIMES DAILY AS NEEDED FOR ANXIETY. 270 tablet 1  . Hyoscyamine Sulfate SL (LEVSIN/SL) 0.125 MG SUBL Place 0.125 mg under the tongue every 6 (six) hours as needed. 60 tablet 3  . magnesium oxide (MAG-OX) 400 MG tablet Take 400 mg by mouth daily.    . metFORMIN (GLUCOPHAGE) 500 MG tablet Take 1 tablet (500 mg total) by mouth every morning. 90 tablet 3  . ondansetron (ZOFRAN ODT) 8 MG disintegrating tablet Take 1 tablet (8 mg total) by mouth every 8 (eight) hours as needed for nausea or vomiting. 20 tablet 3  . pantoprazole (PROTONIX) 40 MG tablet Take 1 tablet (40 mg total) by mouth 2 (two) times daily. Patient will need an appointment for further refills 60 tablet 3  . QUEtiapine (SEROQUEL) 50 MG tablet Take 1 tablet (50 mg total) by mouth at bedtime. 90 tablet 2  . Riboflavin 400 MG CAPS Take 400 mg by mouth daily.    Marland Kitchen topiramate (TOPAMAX) 100 MG tablet Take 1 tablet (100 mg total) by mouth 2 (two) times daily. 60 tablet 3  . traMADol (ULTRAM) 50 MG tablet Take 1 tablet (50 mg total) by mouth every 6 (six) hours as needed. 30 tablet 2  . Ubrogepant (UBRELVY) 100 MG TABS Take 1 tablet by mouth as needed (May repeat dose in 2 hours.  Maximum 2 tablets in 24 hours). 30 tablet 3   No current facility-administered medications on file prior to visit.    Allergies  Allergen Reactions  . Aspirin   . Chlorhexidine   . Ibuprofen   . Iodine   . Nsaids Nausea And Vomiting  . Shellfish Allergy     Tingle in mouth/throat  . Sulfa Antibiotics   . Tape Rash    Blisters     Family History  Problem Relation Age of Onset  . Diabetes Mother   . Migraines Mother   . Hyperlipidemia Mother   . Bipolar disorder Mother   . Irritable bowel syndrome Mother   . Diabetes Father   . Colon cancer Father   . Cancer Father        colon  . Hyperlipidemia Father   . Hypertension Father   . Bipolar disorder Brother   . Migraines Sister   .  Other Neg Hx        pituitary disorder  . Esophageal cancer Neg Hx     There were no vitals taken for this visit.   Review of Systems Denies n/v/ha/visual loss/galactorrhea.  Objective:   Physical Exam    Lab Results  Component Value Date   HGBA1C 5.4 06/10/2019   Prolactin=33    Assessment & Plan:  Hyperprolactinemia, persistent Pituitary adenoma: in view of the above, we discussed repeating the MRI.  She declines again, due to cost Insulin resistance: well-controlled. Please continue the same medications.   Patient Instructions  I have sent a prescription to your pharmacy, to add bromocriptine. Please redo the prolactin blood test in 1 month Please come back for a follow-up appointment in 4 months.

## 2019-06-12 NOTE — Patient Instructions (Signed)
I have sent a prescription to your pharmacy, to add bromocriptine. Please redo the prolactin blood test in 1 month Please come back for a follow-up appointment in 4 months.

## 2019-06-19 MED ORDER — ONDANSETRON 8 MG PO TBDP: 8.0000 mg | ORAL_TABLET | Freq: Three times a day (TID) | ORAL | 3 refills | Status: DC | PRN

## 2019-06-24 ENCOUNTER — Other Ambulatory Visit: Payer: Self-pay

## 2019-06-24 ENCOUNTER — Telehealth: Payer: Self-pay | Admitting: Endocrinology

## 2019-06-24 DIAGNOSIS — E221 Hyperprolactinemia: Secondary | ICD-10-CM

## 2019-06-24 DIAGNOSIS — R739 Hyperglycemia, unspecified: Secondary | ICD-10-CM

## 2019-06-24 MED ORDER — METFORMIN HCL 500 MG PO TABS: 500.0000 mg | ORAL_TABLET | ORAL | 3 refills | Status: DC

## 2019-06-24 MED ORDER — CABERGOLINE 0.5 MG PO TABS: 1.0000 mg | ORAL_TABLET | ORAL | 1 refills | Status: DC

## 2019-06-24 NOTE — Telephone Encounter (Signed)
Please refill both PRN 

## 2019-06-24 NOTE — Telephone Encounter (Signed)
E-Prescribing Status: Receipt confirmed by pharmacy (06/24/2019  4:35 PM EDT)

## 2019-06-24 NOTE — Telephone Encounter (Signed)
Please advise if refill is appropriate 

## 2019-06-24 NOTE — Telephone Encounter (Signed)
MEDICATION: Metformin and Cabergoline  PHARMACY:  [facility]  IS THIS A 90 DAY SUPPLY : YES  IS PATIENT OUT OF MEDICATION: no  IF NOT; HOW MUCH IS LEFT: 5-7 days  LAST APPOINTMENT DATE: @3 /24/2021  NEXT APPOINTMENT DATE:  DO WE HAVE YOUR PERMISSION TO LEAVE A DETAILED MESSAGE:  OTHER COMMENTS:    **Let patient know to contact pharmacy at the end of the day to make sure medication is ready. **  ** Please notify patient to allow 48-72 hours to process**  **Encourage patient to contact the pharmacy for refills or they can request refills through [facility]**

## 2019-07-09 ENCOUNTER — Ambulatory Visit (INDEPENDENT_AMBULATORY_CARE_PROVIDER_SITE_OTHER): Admitting: Pulmonary Disease

## 2019-07-09 ENCOUNTER — Encounter: Payer: Self-pay | Admitting: Pulmonary Disease

## 2019-07-09 ENCOUNTER — Other Ambulatory Visit: Payer: Self-pay

## 2019-07-09 VITALS — BP 90/60 | HR 71 | Temp 97.3°F | Ht 66.0 in | Wt 267.0 lb

## 2019-07-09 DIAGNOSIS — G4700 Insomnia, unspecified: Secondary | ICD-10-CM | POA: Diagnosis not present

## 2019-07-09 MED ORDER — ESZOPICLONE 1 MG PO TABS: 2.0000 mg | ORAL_TABLET | Freq: Every evening | ORAL | 1 refills | Status: DC | PRN

## 2019-07-09 NOTE — Progress Notes (Signed)
Amber [name]    YT:[id number]    06-22-1980  Primary Care Physician:Wendling, Crosby Oyster, DO  Referring Physician: Shelda Pal, [address] [place],  [place] [address]  Chief complaint:   Patient with a history of chronic insomnia  HPI:  Insomnia for many years  History of sleepwalking, sleep eating in the past  Currently uses quetiapine which helps  Has used Benadryl, melatonin in the past  History of mild snoring No witnessed apneas  Reformed smoker quit in 2017  Tired during the day Does not take daytime naps  Does have history of depression  Outpatient Encounter Medications as of 07/09/2019  Medication Sig  . acetaminophen (TYLENOL) 500 MG tablet Take 500 mg by mouth as needed.  . bromocriptine (PARLODEL) 2.5 MG tablet Take 0.5 tablets (1.25 mg total) by mouth at bedtime.  . cabergoline (DOSTINEX) 0.5 MG tablet Take 2 tablets (1 mg total) by mouth 2 (two) times a week.  . diphenhydrAMINE (BENADRYL) 50 MG tablet Take 50 mg by mouth at bedtime as needed for itching.  . escitalopram (LEXAPRO) 10 MG tablet Take 1 tablet (10 mg total) by mouth daily.  . famotidine (PEPCID) 20 MG tablet Take 20 mg by mouth 2 (two) times daily.  . Galcanezumab-gnlm (EMGALITY) 120 MG/ML SOAJ Inject 120 mg into the skin every 28 (twenty-eight) days.  . hydrOXYzine (ATARAX/VISTARIL) 25 MG tablet TAKE 1-3 TABLETS (25-75 MG TOTAL) BY MOUTH 3 (THREE) TIMES DAILY AS NEEDED FOR ANXIETY.  Marland Kitchen Hyoscyamine Sulfate SL (LEVSIN/SL) 0.125 MG SUBL Place 0.125 mg under the tongue every 6 (six) hours as needed.  . magnesium oxide (MAG-OX) 400 MG tablet Take 400 mg by mouth daily.  . metFORMIN (GLUCOPHAGE) 500 MG tablet Take 1 tablet (500 mg total) by mouth every morning.  . ondansetron (ZOFRAN ODT) 8 MG disintegrating tablet Take 1 tablet (8 mg total) by mouth every 8 (eight) hours as needed for nausea or vomiting.  . pantoprazole (PROTONIX) 40 MG tablet Take 1  tablet (40 mg total) by mouth 2 (two) times daily. Patient will need an appointment for further refills  . QUEtiapine (SEROQUEL) 50 MG tablet Take 1 tablet (50 mg total) by mouth at bedtime.  . Riboflavin 400 MG CAPS Take 400 mg by mouth daily.  Marland Kitchen topiramate (TOPAMAX) 100 MG tablet Take 1 tablet (100 mg total) by mouth 2 (two) times daily.  . traMADol (ULTRAM) 50 MG tablet Take 1 tablet (50 mg total) by mouth every 6 (six) hours as needed.  Marland Kitchen Ubrogepant (UBRELVY) 100 MG TABS Take 1 tablet by mouth as needed (May repeat dose in 2 hours.  Maximum 2 tablets in 24 hours).  . eszopiclone (LUNESTA) 1 MG TABS tablet Take 2 tablets (2 mg total) by mouth at bedtime as needed for sleep. Take immediately before bedtime  . [DISCONTINUED] eszopiclone (LUNESTA) 1 MG TABS tablet Take 2 tablets (2 mg total) by mouth at bedtime as needed for sleep. Take immediately before bedtime   No facility-administered encounter medications on file as of 07/09/2019.    Allergies as of 07/09/2019 - Review Complete 07/09/2019  Allergen Reaction Noted  . Aspirin  01/27/2016  . Chlorhexidine  03/23/2017  . Ibuprofen  01/27/2016  . Iodine  01/27/2016  . Nsaids Nausea And Vomiting 01/27/2016  . Shellfish allergy  07/20/2018  . Sulfa antibiotics  01/27/2016  . Tape Rash 01/28/2016    Past Medical History:  Diagnosis Date  .  Anxiety   . Asthma   . Depression   . GERD (gastroesophageal reflux disease)   . History of chicken pox   . History of migraine headaches   . Seizures ([place])     Past Surgical History:  Procedure Laterality Date  . APPENDECTOMY    . CHOLECYSTECTOMY    . Pituiary Tumor     Tumor Removal Pituitary    Family History  Problem Relation Age of Onset  . Diabetes Mother   . Migraines Mother   . Hyperlipidemia Mother   . Bipolar disorder Mother   . Irritable bowel syndrome Mother   . Diabetes Father   . Colon cancer Father   . Cancer Father        colon  . Hyperlipidemia Father   .  Hypertension Father   . Bipolar disorder Brother   . Migraines Sister   . Other Neg Hx        pituitary disorder  . Esophageal cancer Neg Hx     Social History   Socioeconomic History  . Marital status: Married    Spouse name: Jenny Reichmann  . Number of children: 0  . Years of education: Not on file  . Highest education level: 12th grade  Occupational History  . Occupation: unemployed  Tobacco Use  . Smoking status: Former Smoker    Packs/day: 1.00    Years: 15.00    Pack years: 15.00    Types: Cigarettes    Quit date: 01/24/2016    Years since quitting: 3.4  . Smokeless tobacco: Never Used  Substance and Sexual Activity  . Alcohol use: No  . Drug use: No  . Sexual activity: Not on file  Other Topics Concern  . Not on file  Social History Narrative   Patient is right-handed. She lives with her husband in a 2 level home. She drinks I cup of coffee a day. She walks daily, and rides her recumbent bike QOD.   Social Determinants of Health   Financial Resource Strain:   . Difficulty of Paying Living Expenses:   Food Insecurity:   . Worried About Charity fundraiser in the Last Year:   . Arboriculturist in the Last Year:   Transportation Needs:   . Film/video editor (Medical):   Marland Kitchen Lack of Transportation (Non-Medical):   Physical Activity:   . Days of Exercise per Week:   . Minutes of Exercise per Session:   Stress:   . Feeling of Stress :   Social Connections:   . Frequency of Communication with Friends and Family:   . Frequency of Social Gatherings with Friends and Family:   . Attends Religious Services:   . Active Member of Clubs or Organizations:   . Attends Archivist Meetings:   Marland Kitchen Marital Status:   Intimate Partner Violence:   . Fear of Current or Ex-Partner:   . Emotionally Abused:   Marland Kitchen Physically Abused:   . Sexually Abused:     Review of Systems  Constitutional: Negative.   HENT: Negative.   Respiratory: Negative.  Negative for apnea and  shortness of breath.   Cardiovascular: Negative.   Gastrointestinal: Negative.   Endocrine: Negative for polyuria.  Musculoskeletal: Negative.   Psychiatric/Behavioral: Negative.     Vitals:   07/09/19 1621  BP: 90/60  Pulse: 71  Temp: (!) 97.3 F (36.3 C)  SpO2: 100%     Physical Exam  Constitutional: She is oriented to person, place, and  time. She appears well-developed and well-nourished.  HENT:  Head: Normocephalic and atraumatic.  Eyes: Pupils are equal, round, and reactive to light. Right eye exhibits no discharge. Left eye exhibits no discharge.  Neck: No tracheal deviation present. No thyromegaly present.  Cardiovascular: Normal rate and regular rhythm.  Pulmonary/Chest: Effort normal and breath sounds normal. No respiratory distress. She has no wheezes. She has no rales. She exhibits no tenderness.  Musculoskeletal:        General: No edema. Normal range of motion.     Cervical back: Normal range of motion and neck supple.  Neurological: She is alert and oriented to person, place, and time. No cranial nerve deficit. Coordination normal.  Skin: Skin is warm and dry. No erythema.  Psychiatric: She has a normal mood and affect.   Results of the Epworth flowsheet 07/09/2019  Sitting and reading 1  Watching TV 2  Sitting, inactive in a public place (e.g. a theatre or a meeting) 0  As a passenger in a car for an hour without a break 3  Lying down to rest in the afternoon when circumstances permit 3  Sitting and talking to someone 0  Sitting quietly after a lunch without alcohol 1  In a car, while stopped for a few minutes in traffic 0  Total score 10    Assessment:  Chronic insomnia  History of depression  Obesity  Plan/Recommendations: Trial with Lunesta Will start on 1 mg by me increase to 2 mg for effect  We will not use Ambien secondary to history of sleepwalking and sleep eating  Behavioral modifications to help sleep quality  Denies significant  likelihood of sleep apnea  Will see again in 6 weeks   Sherrilyn Rist MD [facility] 07/09/2019, 4:47 PM  CC: Shelda Pal*

## 2019-07-09 NOTE — Patient Instructions (Addendum)
Chronic insomnia  Trial with Lunesta 2 mg Start at 1 mg increase to 2 mg after about a week  Continue using all the other medications  Call with significant concerns

## 2019-07-10 ENCOUNTER — Other Ambulatory Visit: Payer: Self-pay | Admitting: Pulmonary Disease

## 2019-07-10 MED ORDER — ESZOPICLONE 1 MG PO TABS: 2.0000 mg | ORAL_TABLET | Freq: Every evening | ORAL | 1 refills | Status: DC | PRN

## 2019-07-10 NOTE — Telephone Encounter (Signed)
Prescription printed

## 2019-07-10 NOTE — Telephone Encounter (Signed)
Dr. Ander Slade, can you send Amber [name] to the pharmacy? Meds By mail has been updated on pt's chart. Please advise.

## 2019-07-29 ENCOUNTER — Encounter: Payer: Self-pay | Admitting: Family Medicine

## 2019-07-31 ENCOUNTER — Telehealth: Payer: Self-pay | Admitting: Family Medicine

## 2019-07-31 ENCOUNTER — Other Ambulatory Visit: Payer: Self-pay

## 2019-07-31 ENCOUNTER — Ambulatory Visit (INDEPENDENT_AMBULATORY_CARE_PROVIDER_SITE_OTHER)
Admission: RE | Admit: 2019-07-31 | Discharge: 2019-07-31 | Disposition: A | Discharge status: Home / Self Care | Source: Ambulatory Visit

## 2019-07-31 DIAGNOSIS — R197 Diarrhea, unspecified: Secondary | ICD-10-CM | POA: Diagnosis not present

## 2019-07-31 DIAGNOSIS — R110 Nausea: Secondary | ICD-10-CM | POA: Diagnosis not present

## 2019-07-31 DIAGNOSIS — T50905A Adverse effect of unspecified drugs, medicaments and biological substances, initial encounter: Secondary | ICD-10-CM | POA: Diagnosis not present

## 2019-07-31 NOTE — Telephone Encounter (Signed)
Ok with me too

## 2019-07-31 NOTE — Telephone Encounter (Signed)
Patient is requesting for a TOC fro [facility] to [facility]. Due to Transportation. She stated this office would be closer for her. Please advise if transfer is approved. Thanks

## 2019-07-31 NOTE — Telephone Encounter (Signed)
Dr. Olalere please advise 

## 2019-07-31 NOTE — Telephone Encounter (Signed)
Please refer to Dr. Ellison's response 

## 2019-07-31 NOTE — Telephone Encounter (Signed)
Discontinue the [place]

## 2019-07-31 NOTE — Telephone Encounter (Signed)
email sent from patient  "I'm now taking 2mg  of the Lunesta and it is not really helping. I'm not sleeping well. I am waking often. My sleep is very very light. Small sounds wake me. I am usually a heavy sleeper. I'm having wired vivid dreams."  Dr. Ander Slade please advise.

## 2019-07-31 NOTE — ED Provider Notes (Signed)
Virtual Visit via Video Note:  Amber [name]  initiated request for Telemedicine visit with [facility] team. I connected with Amber [name]  on 07/31/2019 at 11:22 AM  for a synchronized telemedicine visit using a video enabled HIPPA compliant telemedicine application. I verified that I am speaking with Amber [name]  using two identifiers. Amber Hall-Potvin, PA-C  was physically located in a [place]gent care site and [name] was located at a different location.   The limitations of evaluation and management by telemedicine as well as the availability of in-person appointments were discussed. Patient was informed that she  may incur a bill ( including co-pay) for this virtual visit encounter. Amber [name]  expressed understanding and gave verbal consent to proceed with virtual visit.     History of Present Illness:Amber [name]  is a 39 y.o. female with history of GERD, laryngopharyngeal reflux, obesity, migraine presents with concern for epigastric pain with nausea since last night.  Patient states this is preceded by 4-day course of diarrhea without blood or melena.  Patient denies radiation of pain, increased reflux, sore throat, change in diet.  Patient did undergo medication change last week.  Switch from quetiapine to [facility].  States [name] has not been helpful with her insomnia: Reached out via [employer] regarding that issue.  Patient has been compliant with her home Protonix and famotidine.  No fever, chills, myalgias, arthralgias, cough, chest pain, shortness of breath.  Patient denies history of alcohol or illicit drug use.  No history of gastritis or NSAID use.  Has not tried anything for this.  Able to keep down water, though has decreased appetite.    ROI as per HPI  Past Medical History:  Diagnosis Date  . Anxiety   . Asthma   . Depression   . GERD (gastroesophageal reflux disease)   . History of chicken pox   .  History of migraine headaches   . Seizures ([facility])     Allergies  Allergen Reactions  . Aspirin   . Chlorhexidine   . Ibuprofen   . Iodine   . Nsaids Nausea And Vomiting  . Shellfish Allergy     Tingle in mouth/throat  . Sulfa Antibiotics   . Tape Rash    Blisters        Observations/Objective: 39 y.o. female sitting in no acute distress.  Patient is able to speak in full sentences without coughing, sneezing, wheezing.  Assessment and Plan: Discussed this could be related to [facility] ADRs.  Patient feels current dose is not effective with insomnia at this time: We will discontinue, follow-up with prescriber for further management of insomnia.  Will keep track of symptoms while off of Lunesta to see if this helps.  Patient has Zofran at home: We will try taking this for nausea.  Low concern for gastritis, pancreatitis given history.  Could also consider viral gastroenteritis, though diarrhea does not appear to be as bothersome at this time.  Return precautions discussed, patient verbalized understanding and is agreeable to plan.  Follow Up Instructions: Patient to seek in-person evaluation for persistent or worsening symptoms.   I discussed the assessment and treatment plan with the patient. The patient was provided an opportunity to ask questions and all were answered. The patient agreed with the plan and demonstrated an understanding of the instructions.   The patient was advised to call back or seek an in-person evaluation if the symptoms worsen or if the condition fails to improve as  anticipated.  I provided 15 minutes of non-face-to-face time during this encounter.    [place], PA-C  07/31/2019 11:22 AM        [name], [name], PA-C 07/31/19 1134

## 2019-07-31 NOTE — Discharge Instructions (Addendum)
Continue following up with your sleep doctor about your insomnia. Recommend holding Lunesta to see if this helps with your nausea as it could be a side effect. May take Zofran up to every 8 hours as needed for nausea. Recommend following up in person for persistent nausea, or you develop vomiting, fever, severe pain.

## 2019-07-31 NOTE — Telephone Encounter (Signed)
OK 

## 2019-08-21 ENCOUNTER — Encounter: Payer: Self-pay | Admitting: Internal Medicine

## 2019-08-26 NOTE — Progress Notes (Signed)
Name: Amber [name]  MRN/ DOB: [id number], Feb 25, 1981    Age/ Sex: 39 y.o., female     PCP: Shelda Pal, DO   Reason for Endocrinology Evaluation: Hyperprolactinemia      Initial Endocrinology Clinic Visit: 03/02/2016    PATIENT IDENTIFIER: Amber [name] is a 39 y.o., female with a past medical history of Prolactinoma, GERD, Asthma and migraine headaches . She has followed with [facility] since 03/02/2016 for consultative assistance with management of her hyperprolactinemia .   HISTORICAL SUMMARY: The patient was first diagnosed with hyperprolactinemia in 1996 during evaluation for primary amenorrhea ( highest reading 198 ). She is S/P resection of prolactinoma in 2009 [place], [place] due to size 3 cm, per pt she had a sphenoidal sinus penetration. Per pt prolactin levels normalized and menses resumed.  Pt was noted with elevated prolactin levels again 2015 and was started on Cabergoline, imaging have been reported at " no tumor"     Cabergoline was started 2019 Pt declined brain imaging  Bromocriptine was added to cabergoline 05/2019  SUBJECTIVE:    Today (08/27/2019):  Amber [name] is here for a follow up on hyperprolactinemia  She has been on cabergoline and Bromocriptine  She has read that having both of them combined could cause vasospasms   Has migraine headaches.   Half a tablet of bromocriptine at bedtime   Has regular menstruation  Denies nipple discharge  No vision changes in   No FH of pituitary disease.     She was started on Metformin due to insulin resistant. Was feeling dizzy at some point and BG's were in 60's , no dizziness any more and had made dietary changes.      ROS:  As per HPI.   HISTORY:  Past Medical History:  Past Medical History:  Diagnosis Date  . Anxiety   . Asthma   . Depression   . GERD (gastroesophageal reflux disease)   . History of chicken pox   . History of migraine headaches   .  Seizures ([place])    Past Surgical History:  Past Surgical History:  Procedure Laterality Date  . APPENDECTOMY    . CHOLECYSTECTOMY    . Pituiary Tumor     Tumor Removal Pituitary    Social History:  reports that she quit smoking about 3 years ago. Her smoking use included cigarettes. She has a 15.00 pack-year smoking history. She has never used smokeless tobacco. She reports that she does not drink alcohol or use drugs. Family History:  Family History  Problem Relation Age of Onset  . Diabetes Mother   . Migraines Mother   . Hyperlipidemia Mother   . Bipolar disorder Mother   . Irritable bowel syndrome Mother   . Diabetes Father   . Colon cancer Father   . Cancer Father        colon  . Hyperlipidemia Father   . Hypertension Father   . Bipolar disorder Brother   . Migraines Sister   . Other Neg Hx        pituitary disorder  . Esophageal cancer Neg Hx      HOME MEDICATIONS: Allergies as of 08/27/2019      Reactions   Aspirin    Chlorhexidine    Ibuprofen    Iodine    Lunesta [eszopiclone] Other (See Comments)   GI issues   Nsaids Nausea And Vomiting   Shellfish Allergy    Tingle in mouth/throat   Sulfa Antibiotics  Tape Rash   Blisters      Medication List       Accurate as of August 27, 2019  9:39 AM. If you have any questions, ask your nurse or doctor.        bromocriptine 2.5 MG tablet Commonly known as: PARLODEL Take 0.5 tablets (1.25 mg total) by mouth at bedtime.   cabergoline 0.5 MG tablet Commonly known as: DOSTINEX Take 2 tablets (1 mg total) by mouth 2 (two) times a week.   diphenhydrAMINE 50 MG tablet Commonly known as: BENADRYL Take 50 mg by mouth at bedtime as needed for itching.   Emgality 120 MG/ML Soaj Generic drug: Galcanezumab-gnlm Inject 120 mg into the skin every 28 (twenty-eight) days.   escitalopram 10 MG tablet Commonly known as: LEXAPRO Take 1 tablet (10 mg total) by mouth daily.   eszopiclone 1 MG Tabs tablet Commonly  known as: LUNESTA Take 2 tablets (2 mg total) by mouth at bedtime as needed for sleep. Take immediately before bedtime   famotidine 20 MG tablet Commonly known as: PEPCID Take 20 mg by mouth 2 (two) times daily.   hydrOXYzine 25 MG tablet Commonly known as: ATARAX/VISTARIL TAKE 1-3 TABLETS (25-75 MG TOTAL) BY MOUTH 3 (THREE) TIMES DAILY AS NEEDED FOR ANXIETY.   Hyoscyamine Sulfate SL 0.125 MG Subl Commonly known as: Levsin/SL Place 0.125 mg under the tongue every 6 (six) hours as needed.   magnesium oxide 400 MG tablet Commonly known as: MAG-OX Take 400 mg by mouth daily.   metFORMIN 500 MG tablet Commonly known as: GLUCOPHAGE Take 1 tablet (500 mg total) by mouth every morning.   ondansetron 8 MG disintegrating tablet Commonly known as: Zofran ODT Take 1 tablet (8 mg total) by mouth every 8 (eight) hours as needed for nausea or vomiting.   pantoprazole 40 MG tablet Commonly known as: PROTONIX Take 1 tablet (40 mg total) by mouth 2 (two) times daily. Patient will need an appointment for further refills   QUEtiapine 50 MG tablet Commonly known as: SEROQUEL Take 1 tablet (50 mg total) by mouth at bedtime.   Riboflavin 400 MG Caps Take 400 mg by mouth daily.   topiramate 100 MG tablet Commonly known as: TOPAMAX Take 1 tablet (100 mg total) by mouth 2 (two) times daily.   traMADol 50 MG tablet Commonly known as: ULTRAM Take 1 tablet (50 mg total) by mouth every 6 (six) hours as needed.   TYLENOL 500 MG tablet Generic drug: acetaminophen Take 500 mg by mouth as needed.   Ubrelvy 100 MG Tabs Generic drug: Ubrogepant Take 1 tablet by mouth as needed (May repeat dose in 2 hours.  Maximum 2 tablets in 24 hours).         OBJECTIVE:   PHYSICAL EXAM: VS: BP 116/68 (BP Location: Right Arm, Patient Position: Sitting, Cuff Size: Large)   Pulse 82   Temp 98.7 F (37.1 C)   Ht 5\' 6"  (1.676 m)   Wt 263 lb 3.2 oz (119.4 kg)   LMP 08/01/2019 (Exact Date)   SpO2 98%    BMI 42.48 kg/m    EXAM: General: Pt appears well and is in NAD  Neck: General: Supple without adenopathy. Thyroid: Thyroid size normal.  No goiter or nodules appreciated. No thyroid bruit.  Lungs: Clear with good BS bilat with no rales, rhonchi, or wheezes  Heart: Auscultation: RRR.  Abdomen: Normoactive bowel sounds, soft, nontender, without masses or organomegaly palpable  Extremities:  BL LE: No pretibial edema  normal ROM and strength.  Skin: Hair: Texture and amount normal with gender appropriate distribution Skin Inspection: No rashes Skin Palpation: Skin temperature, texture, and thickness normal to palpation  Neuro: Cranial nerves: II - XII grossly intact  Motor: Normal strength throughout DTRs: 2+ and symmetric in UE without delay in relaxation phase  Mental Status: Judgment, insight: Intact Orientation: Oriented to time, place, and person Mood and affect: No depression, anxiety, or agitation     DATA REVIEWED:  Results for Amber [name], Amber [name] (MRN [id number]) as of 08/29/2019 07:23  Ref. Range 08/27/2019 10:15  PROLACTIN,UNDILUTED Latest Ref Range: 2.0 - 30.0 ng/mL 45.9 (H)     Results for Amber [name], Amber [name]" (MRN [id number]) as of 08/29/2019 07:23  Ref. Range 08/27/2019 10:15  Sodium Latest Ref Range: 135 - 145 mEq/L 136  Potassium Latest Ref Range: 3.5 - 5.1 mEq/L 4.2  Chloride Latest Ref Range: 96 - 112 mEq/L 108  CO2 Latest Ref Range: 19 - 32 mEq/L 23  Glucose Latest Ref Range: 70 - 99 mg/dL 100 (H)  BUN Latest Ref Range: 6 - 23 mg/dL 13  Creatinine Latest Ref Range: 0.40 - 1.20 mg/dL 0.98  Calcium Latest Ref Range: 8.4 - 10.5 mg/dL 9.1  GFR Latest Ref Range: >60.00 mL/min 62.98  Cortisol, Plasma Latest Units: ug/dL 7.6  LH Latest Units: mIU/mL 5.24  FSH Latest Units: mIU/ML 3.0  Estradiol Latest Units: pg/mL 94  TSH Latest Ref Range: 0.35 - 4.50 uIU/mL 1.07  T4,Free(Direct) Latest Ref Range: 0.60 - 1.60 ng/dL 0.69     MRI 2018 IMPRESSION: 6 x 9  mm hypoenhancing pituitary microadenoma centrally and the left side of the gland. No compression of the optic chiasm.  ASSESSMENT / PLAN / RECOMMENDATIONS:   1. Hx of Pituitary Macroadenoma , S/P Transphenoidal surgery in 2009:  - Pr pt she had a transphenoidal surgery for a 3 cm pituitary adenoma in 2009 - Her last MRI was in 2018 indicating regrowth  - Pt is not opposed to having a repeat MRI , awaiting on prolactin levels prior to making a determination on this.   2. Hyperprolactinemia :  - Diagnosed in 1996 - Normalization of levels following transphenoidal pituitary resection in 2009 , restarted on cabergoline in 2015 and again in 2019.  - Prolactin levels continue to be elevated ( but trending down) despite escalation of cabergoline dose.  - In March 2021, bromocriptine was added to her regimen by previouse endocrinologist  - I have discussed with her that in the literature  long-term cabergoline treatment induced the successful control of hyperprolactinemia in a higher proportion of patients than did bromocriptine  Treatment but I also discussed with her the reported association of  cardiac valvular abnormalities with cabergoline doses of > 2 mg/week. If we decide to increase her cabergoline any more, she will require serial echocardiograms.     Medications  Stop bromocriptine Increase cabergoline to 2.5 mg weekly, pt will take this in 3 divided doses  We will proceed with echocardiogram as well as pituitary MRI  2. Pre-Diabetes :  - Pt has been noted to have an A1c of 5.8 % in 10/2018. She is on metformin and tolerating it well  - Last a1c was normal at 5.4%  - Pt under the impression of needing insulin levels checked serially but I have advised her that insulin levels have no clinical value while on metformin and the best way to assess her resistance is with weight, fasting glucose and A1c's - I  have encouraged her to continue with weight loss and life style changes      Continue Metformin 500 mg daily     F/U in 4 months      Signed electronically by: Mack Guise, MD  [facility] End[facility]  [facility] [address] [place], [place] [address] Phone: [phone] FAX: [phone]      CC: Shelda Pal, [address] [place]  [address] Phone: [phone]  Fax: [phone]   Return to Endocrinology clinic as below: Future Appointments  Date Time Provider [place]  08/28/2019  3:45 PM Shelda Pal, DO [facility]  10/04/2019 10:50 AM Pieter Partridge, DO [facility] None

## 2019-08-27 ENCOUNTER — Encounter: Payer: Self-pay | Admitting: Internal Medicine

## 2019-08-27 ENCOUNTER — Ambulatory Visit (INDEPENDENT_AMBULATORY_CARE_PROVIDER_SITE_OTHER): Admitting: Internal Medicine

## 2019-08-27 ENCOUNTER — Other Ambulatory Visit: Payer: Self-pay

## 2019-08-27 VITALS — BP 116/68 | HR 82 | Temp 98.7°F | Ht 66.0 in | Wt 263.2 lb

## 2019-08-27 DIAGNOSIS — R7303 Prediabetes: Secondary | ICD-10-CM | POA: Diagnosis not present

## 2019-08-27 DIAGNOSIS — D352 Benign neoplasm of pituitary gland: Secondary | ICD-10-CM

## 2019-08-27 DIAGNOSIS — E221 Hyperprolactinemia: Secondary | ICD-10-CM

## 2019-08-27 LAB — BASIC METABOLIC PANEL
BUN: 13 mg/dL (ref 6–23)
CO2: 23 mEq/L (ref 19–32)
Calcium: 9.1 mg/dL (ref 8.4–10.5)
Chloride: 108 mEq/L (ref 96–112)
Creatinine, Ser: 0.98 mg/dL (ref 0.40–1.20)
GFR: 62.98 mL/min (ref >60.00)
Glucose, Bld: 100 mg/dL — ABNORMAL HIGH (ref 70–99)
Potassium: 4.2 mEq/L (ref 3.5–5.1)
Sodium: 136 mEq/L (ref 135–145)

## 2019-08-27 LAB — T4, FREE: Free T4: 0.69 ng/dL (ref 0.60–1.60)

## 2019-08-27 LAB — CORTISOL: Cortisol, Plasma: 7.6 ug/dL

## 2019-08-27 LAB — TSH: TSH: 1.07 u[IU]/mL (ref 0.35–4.50)

## 2019-08-27 LAB — LUTEINIZING HORMONE: LH: 5.24 m[IU]/mL

## 2019-08-27 LAB — FOLLICLE STIMULATING HORMONE: FSH: 3 m[IU]/mL

## 2019-08-27 NOTE — Patient Instructions (Signed)
-   Please stop by the lab today, we will send a portal message with the results.  - After these results will consider a pituitary MRI

## 2019-08-28 ENCOUNTER — Ambulatory Visit (INDEPENDENT_AMBULATORY_CARE_PROVIDER_SITE_OTHER): Admitting: Family Medicine

## 2019-08-28 ENCOUNTER — Other Ambulatory Visit: Payer: Self-pay | Admitting: Neurology

## 2019-08-28 ENCOUNTER — Encounter: Payer: Self-pay | Admitting: Family Medicine

## 2019-08-28 VITALS — BP 104/74 | HR 72 | Resp 14 | Ht 66.0 in | Wt 263.0 lb

## 2019-08-28 DIAGNOSIS — M722 Plantar fascial fibromatosis: Secondary | ICD-10-CM

## 2019-08-28 MED ORDER — QUETIAPINE FUMARATE 100 MG PO TABS: 100.0000 mg | ORAL_TABLET | Freq: Every day | ORAL | 2 refills | Status: DC

## 2019-08-28 NOTE — Progress Notes (Signed)
Musculoskeletal Exam  Patient: Amber [name] DOB: 1980-03-23  DOS: 08/28/2019  SUBJECTIVE:  Chief Complaint:   CC: R heel pain  Amber [name] is a 39 y.o.  female for evaluation and treatment of R foot pain.   Onset:  2 months ago. No inj or change in activity.  Location: R heel Character:  aching and sharp  Progression of issue:  has worsened Associated symptoms: worse in AM, no bruising, redness or swelling Treatment: to date has been none.   Neurovascular symptoms: no  Past Medical History:  Diagnosis Date  . Anxiety   . Asthma   . Depression   . GERD (gastroesophageal reflux disease)   . History of chicken pox   . History of migraine headaches   . Seizures (HCC)     Objective: VITAL SIGNS: BP 104/74 (BP Location: Left Arm)   Pulse 72   Resp 14   Ht 5\' 6"  (1.676 m)   Wt 263 lb (119.3 kg)   LMP 08/01/2019 (Exact Date)   BMI 42.45 kg/m  Constitutional: Well formed, well developed. No acute distress. Cardiovascular: Brisk cap refill Thorax & Lungs: No accessory muscle use Musculoskeletal: R foot.   Normal active range of motion: yes.   Normal passive range of motion: yes Tenderness to palpation: yes over prox insertion of R PF Deformity: no Ecchymosis: no Neurologic: Normal sensory function.  Psychiatric: Normal mood. Age appropriate judgment and insight. Alert & oriented x 3.    Assessment:  Plantar fasciitis of right foot  Plan: Tylenol, ice, stretches/exercises, [place] sock.  F/u in 1 mo if no improvement, consider inj vs PT. The patient voiced understanding and agreement to the plan.   [place], DO 08/28/19  4:36 PM

## 2019-08-28 NOTE — Patient Instructions (Signed)
Heat (pad or rice pillow in microwave) over affected area, 10-15 minutes twice daily.   Ice/cold pack over area for 10-15 min twice daily.  OK to take Tylenol 1000 mg (2 extra strength tabs) or 975 mg (3 regular strength tabs) every 6 hours as needed.  Consider a [place] sock to wear at night.   Plantar Fasciitis Stretches/exercises Do exercises exactly as told by your health care provider and adjust them as directed. It is normal to feel mild stretching, pulling, tightness, or discomfort as you do these exercises, but you should stop right away if you feel sudden pain or your pain gets worse.   Stretching and range of motion exercises These exercises warm up your muscles and joints and improve the movement and flexibility of your foot. These exercises also help to relieve pain.  Exercise A: Plantar fascia stretch 1. Sit with your left / right leg crossed over your opposite knee. 2. Hold your heel with one hand with that thumb near your arch. With your other hand, hold your toes and gently pull them back toward the top of your foot. You should feel a stretch on the bottom of your toes or your foot or both. 3. Hold this stretch for 30 seconds. 4. Slowly release your toes and return to the starting position. Repeat 2 times. Complete this exercise 3 times per week.  Exercise B: Gastroc, standing 1. Stand with your hands against a wall. 2. Extend your left / right leg behind you, and bend your front knee slightly. 3. Keeping your heels on the floor and keeping your back knee straight, shift your weight toward the wall without arching your back. You should feel a gentle stretch in your left / right calf. 4. Hold this position for 30 seconds. Repeat 2 times. Complete this exercise 3 times a week. Exercise C: Soleus, standing 1. Stand with your hands against a wall. 2. Extend your left / right leg behind you, and bend your front knee slightly. 3. Keeping your heels on the floor, bend your  back knee and slightly shift your weight over the back leg. You should feel a gentle stretch deep in your calf. 4. Hold this position for 30 seconds. Repeat 2 times. Complete this exercise 3 times per week. Exercise D: Gastrocsoleus, standing 1. Stand with the ball of your left / right foot on a step. The ball of your foot is on the walking surface, right under your toes. 2. Keep your other foot firmly on the same step. 3. Hold onto the wall or a railing for balance. 4. Slowly lift your other foot, allowing your body weight to press your heel down over the edge of the step. You should feel a stretch in your left / right calf. 5. Hold this position for 30 seconds. 6. Return both feet to the step. 7. Repeat this exercise with a slight bend in your left / right knee. Repeat 2 times with your left / right knee straight and 2times with your left / right knee bent. Complete this exercise 3 times a week.  Balance exercise This exercise builds your balance and strength control of your arch to help take pressure off your plantar fascia. Exercise E: Single leg stand 1. Without shoes, stand near a railing or in a doorway. You may hold onto the railing or door frame as needed. 2. Stand on your left / right foot. Keep your big toe down on the floor and try to keep your arch lifted.  Do not let your foot roll inward. 3. Hold this position for 30 seconds. 4. If this exercise is too easy, you can try it with your eyes closed or while standing on a pillow. Repeat 2 times. Complete this exercise 3 times per week. This information is not intended to replace advice given to you by your health care provider. Make sure you discuss any questions you have with your health care provider. Document Released: 03/07/2005 Document Revised: 11/10/2015 Document Reviewed: 01/19/2015 Elsevier Interactive Patient Education  2017 [employer].

## 2019-08-29 MED ORDER — CABERGOLINE 0.5 MG PO TABS: 0.5000 mg | ORAL_TABLET | ORAL | 2 refills | Status: DC

## 2019-08-31 ENCOUNTER — Other Ambulatory Visit: Payer: Self-pay

## 2019-08-31 ENCOUNTER — Ambulatory Visit (HOSPITAL_BASED_OUTPATIENT_CLINIC_OR_DEPARTMENT_OTHER)
Admission: RE | Admit: 2019-08-31 | Discharge: 2019-08-31 | Disposition: A | Discharge status: Home / Self Care | Source: Ambulatory Visit | Attending: Internal Medicine | Admitting: Internal Medicine

## 2019-08-31 DIAGNOSIS — E221 Hyperprolactinemia: Secondary | ICD-10-CM | POA: Diagnosis not present

## 2019-08-31 LAB — ESTRADIOL: Estradiol: 94 pg/mL

## 2019-08-31 LAB — PROLACTIN W/DILUTION: PROLACTIN,UNDILUTED: 45.9 ng/mL — ABNORMAL HIGH (ref 2.0–30.0)

## 2019-08-31 LAB — ACTH: C206 ACTH: 31 pg/mL (ref 6–50)

## 2019-08-31 LAB — INSULIN-LIKE GROWTH FACTOR
IGF-I, LC/MS: 88 ng/mL (ref 53–331)
Z-Score (Female): -1 SD (ref ?–2.0)

## 2019-08-31 MED ORDER — GADOBUTROL 1 MMOL/ML IV SOLN
7.9000 mL | Freq: Once | INTRAVENOUS | Status: AC | PRN
  Administered 2019-08-31: 7.9 mL via INTRAVENOUS

## 2019-09-11 ENCOUNTER — Encounter (HOSPITAL_BASED_OUTPATIENT_CLINIC_OR_DEPARTMENT_OTHER): Payer: Self-pay

## 2019-09-11 ENCOUNTER — Other Ambulatory Visit: Payer: Self-pay

## 2019-09-11 DIAGNOSIS — J45998 Other asthma: Secondary | ICD-10-CM | POA: Diagnosis not present

## 2019-09-11 DIAGNOSIS — Z7982 Long term (current) use of aspirin: Secondary | ICD-10-CM | POA: Diagnosis not present

## 2019-09-11 DIAGNOSIS — Z882 Allergy status to sulfonamides status: Secondary | ICD-10-CM | POA: Insufficient documentation

## 2019-09-11 DIAGNOSIS — Z79899 Other long term (current) drug therapy: Secondary | ICD-10-CM | POA: Insufficient documentation

## 2019-09-11 DIAGNOSIS — G43409 Hemiplegic migraine, not intractable, without status migrainosus: Secondary | ICD-10-CM | POA: Diagnosis not present

## 2019-09-11 DIAGNOSIS — R519 Headache, unspecified: Secondary | ICD-10-CM | POA: Diagnosis present

## 2019-09-11 DIAGNOSIS — Z87891 Personal history of nicotine dependence: Secondary | ICD-10-CM | POA: Diagnosis not present

## 2019-09-11 NOTE — ED Triage Notes (Signed)
Pt c/o migraine x 4 days-NAD-steady gait

## 2019-09-12 ENCOUNTER — Other Ambulatory Visit: Payer: Self-pay

## 2019-09-12 ENCOUNTER — Emergency Department (HOSPITAL_BASED_OUTPATIENT_CLINIC_OR_DEPARTMENT_OTHER)
Admission: EM | Admit: 2019-09-12 | Discharge: 2019-09-12 | Disposition: A | Discharge status: Home / Self Care | Attending: Emergency Medicine | Admitting: Emergency Medicine

## 2019-09-12 ENCOUNTER — Emergency Department (HOSPITAL_BASED_OUTPATIENT_CLINIC_OR_DEPARTMENT_OTHER)
Admission: EM | Admit: 2019-09-12 | Discharge: 2019-09-13 | Disposition: A | Discharge status: Home / Self Care | Source: Home / Self Care | Attending: Emergency Medicine | Admitting: Emergency Medicine

## 2019-09-12 ENCOUNTER — Encounter (HOSPITAL_BASED_OUTPATIENT_CLINIC_OR_DEPARTMENT_OTHER): Payer: Self-pay | Admitting: Emergency Medicine

## 2019-09-12 DIAGNOSIS — G43409 Hemiplegic migraine, not intractable, without status migrainosus: Secondary | ICD-10-CM

## 2019-09-12 DIAGNOSIS — Z87891 Personal history of nicotine dependence: Secondary | ICD-10-CM | POA: Insufficient documentation

## 2019-09-12 DIAGNOSIS — J45909 Unspecified asthma, uncomplicated: Secondary | ICD-10-CM | POA: Insufficient documentation

## 2019-09-12 DIAGNOSIS — G43809 Other migraine, not intractable, without status migrainosus: Secondary | ICD-10-CM | POA: Insufficient documentation

## 2019-09-12 DIAGNOSIS — H53149 Visual discomfort, unspecified: Secondary | ICD-10-CM | POA: Insufficient documentation

## 2019-09-12 DIAGNOSIS — R112 Nausea with vomiting, unspecified: Secondary | ICD-10-CM | POA: Insufficient documentation

## 2019-09-12 MED ORDER — HYDROMORPHONE HCL 1 MG/ML IJ SOLN
1.0000 mg | Freq: Once | INTRAMUSCULAR | Status: AC
  Administered 2019-09-12: 1 mg via INTRAVENOUS
  Filled 2019-09-12: qty 1

## 2019-09-12 MED ORDER — DIPHENHYDRAMINE HCL 50 MG/ML IJ SOLN
25.0000 mg | Freq: Once | INTRAMUSCULAR | Status: AC
  Administered 2019-09-12: 25 mg via INTRAVENOUS
  Filled 2019-09-12: qty 1

## 2019-09-12 MED ORDER — PROMETHAZINE HCL 25 MG/ML IJ SOLN
25.0000 mg | Freq: Once | INTRAMUSCULAR | Status: AC
  Administered 2019-09-12: 25 mg via INTRAVENOUS
  Filled 2019-09-12: qty 1

## 2019-09-12 MED ORDER — SODIUM CHLORIDE 0.9 % IV BOLUS
1000.0000 mL | Freq: Once | INTRAVENOUS | Status: AC
  Administered 2019-09-12: 1000 mL via INTRAVENOUS

## 2019-09-12 MED ORDER — PROMETHAZINE HCL 25 MG/ML IJ SOLN
12.5000 mg | Freq: Once | INTRAMUSCULAR | Status: AC
  Administered 2019-09-12: 12.5 mg via INTRAVENOUS
  Filled 2019-09-12: qty 1

## 2019-09-12 NOTE — ED Provider Notes (Signed)
[facility] EMERGENCY DEPARTMENT Provider Note   CSN: [id number] Arrival date & time: 09/12/19  1728     History Chief Complaint  Patient presents with  . Headache    Amber [name] is a 39 y.o. female.  Patient is a 39 year old female who presents with a migraine.  She has a history of migraines.  She is followed by [facility] neurology.  She said she has had a 5-day history of migraine.  It is left-sided which is typical for her migraines.  There is no associated neck pain.  No fevers.  She does have photophobia and some nausea and vomiting.  She says though that the pain is very typical for her migraines.  She does not have any unusual or atypical symptoms.  No recent head trauma.  She took her preventative medicine which is an injection of Emgality 5 days ago.  She also took her abortive medicine, Ubrelvy, and Zofran without improvement in symptoms.  She was actually seen here yesterday in the emergency room and got injections of Benadryl, Phenergan and Dilaudid.  She felt much better and was able to go to sleep but she woke up this morning with a migraine again.  She has an appointment with her neurologist tomorrow to try another abortive medicine but she requests another migraine cocktail to get her through until then.        Past Medical History:  Diagnosis Date  . Anxiety   . Asthma   . Depression   . GERD (gastroesophageal reflux disease)   . History of chicken pox   . History of migraine headaches   . Seizures [facility])     Patient Active Problem List   Diagnosis Date Noted  . Hyperglycemia 06/05/2019  . Insomnia 01/07/2019  . Bilateral hand pain 11/09/2018  . Agoraphobia 01/18/2018  . Anxiety with depression 03/24/2017  . Migraine 05/11/2016  . Pituitary adenoma ([place]) 03/05/2016  . Hyperprolactinemia ([place]) 03/02/2016    Past Surgical History:  Procedure Laterality Date  . APPENDECTOMY    . CHOLECYSTECTOMY    . Pituiary Tumor     Tumor Removal  Pituitary     OB History   No obstetric history on file.     Family History  Problem Relation Age of Onset  . Diabetes Mother   . Migraines Mother   . Hyperlipidemia Mother   . Bipolar disorder Mother   . Irritable bowel syndrome Mother   . Diabetes Father   . Colon cancer Father   . Cancer Father        colon  . Hyperlipidemia Father   . Hypertension Father   . Bipolar disorder Brother   . Migraines Sister   . Other Neg Hx        pituitary disorder  . Esophageal cancer Neg Hx     Social History   Tobacco Use  . Smoking status: Former Smoker    Packs/day: 1.00    Years: 15.00    Pack years: 15.00    Types: Cigarettes    Quit date: 01/24/2016    Years since quitting: 3.6  . Smokeless tobacco: Never Used  Vaping Use  . Vaping Use: Former  . Devices: patient said she tried it 6 years ago and wasn't for her  Substance Use Topics  . Alcohol use: No  . Drug use: No    Home Medications Prior to Admission medications   Medication Sig Start Date End Date Taking? Authorizing Provider  acetaminophen (TYLENOL) 500  MG tablet Take 500 mg by mouth as needed.    [provider]  cabergoline (DOSTINEX) 0.5 MG tablet Take 1 tablet (0.5 mg total) by mouth as directed. 1 mg twice a week and 0.5 mg once a week 08/29/19   Shamleffer,  Crazier, MD  diphenhydrAMINE (BENADRYL) 50 MG tablet Take 50 mg by mouth at bedtime as needed for itching.    [provider]  escitalopram (LEXAPRO) 10 MG tablet Take 1 tablet (10 mg total) by mouth daily. 06/10/19   Shelda Pal, DO  famotidine (PEPCID) 20 MG tablet Take 20 mg by mouth 2 (two) times daily.    [provider]  Galcanezumab-gnlm (EMGALITY) 120 MG/ML SOAJ Inject 120 mg into the skin every 28 (twenty-eight) days. 06/03/19   Tomi Likens, Adam R, DO  hydrOXYzine (ATARAX/VISTARIL) 25 MG tablet TAKE 1-3 TABLETS (25-75 MG TOTAL) BY MOUTH 3 (THREE) TIMES DAILY AS NEEDED FOR ANXIETY. 01/22/18   Shelda Pal, DO  Hyoscyamine Sulfate SL (LEVSIN/SL) 0.125 MG SUBL Place 0.125 mg under the tongue every 6 (six) hours as needed. 02/05/19   Cirigliano, Vito V, DO  magnesium oxide (MAG-OX) 400 MG tablet Take 400 mg by mouth daily.    [provider]  metFORMIN (GLUCOPHAGE) 500 MG tablet Take 1 tablet (500 mg total) by mouth every morning. 06/24/19   Renato Shin, MD  ondansetron (ZOFRAN ODT) 8 MG disintegrating tablet Take 1 tablet (8 mg total) by mouth every 8 (eight) hours as needed for nausea or vomiting. 06/19/19   Tomi Likens, Adam R, DO  pantoprazole (PROTONIX) 40 MG tablet Take 1 tablet (40 mg total) by mouth 2 (two) times daily. Patient will need an appointment for further refills 06/04/19   Cirigliano, Vito V, DO  QUEtiapine (SEROQUEL) 100 MG tablet Take 1 tablet (100 mg total) by mouth at bedtime. 08/28/19   Shelda Pal, DO  Riboflavin 400 MG CAPS Take 400 mg by mouth daily.    [provider]  topiramate (TOPAMAX) 100 MG tablet Take 1 tablet (100 mg total) by mouth 2 (two) times daily. 06/03/19   Pieter Partridge, DO  traMADol (ULTRAM) 50 MG tablet Take 1 tablet (50 mg total) by mouth every 6 (six) hours as needed. 06/03/19   Tomi Likens, Adam R, DO  Ubrogepant (UBRELVY) 100 MG TABS Take 1 tablet by mouth as needed (May repeat dose in 2 hours.  Maximum 2 tablets in 24 hours). 06/05/19   Pieter Partridge, DO    Allergies    Aspirin, Chlorhexidine, Ibuprofen, Iodine, Lunesta [eszopiclone], Nsaids, Shellfish allergy, Sulfa antibiotics, and Tape  Review of Systems   Review of Systems  Constitutional: Negative for chills, diaphoresis, fatigue and fever.  HENT: Negative for congestion, rhinorrhea and sneezing.   Eyes: Positive for photophobia.  Respiratory: Negative for cough, chest tightness and shortness of breath.   Cardiovascular: Negative for chest pain and leg swelling.  Gastrointestinal: Positive for nausea and vomiting. Negative for abdominal pain, blood in stool and  diarrhea.  Genitourinary: Negative for difficulty urinating, flank pain, frequency and hematuria.  Musculoskeletal: Negative for arthralgias and back pain.  Skin: Negative for rash.  Neurological: Positive for headaches. Negative for dizziness, speech difficulty, weakness and numbness.    Physical Exam Updated Vital Signs BP (!) 103/56 (BP Location: Right Arm)   Pulse 69   Temp 98.8 F (37.1 C) (Oral)   Resp 14   LMP 09/05/2019   SpO2 98%   Physical Exam Constitutional:  Appearance: She is well-developed.  HENT:     Head: Normocephalic and atraumatic.  Eyes:     Pupils: Pupils are equal, round, and reactive to light.     Comments: Fundi not well-visualized  Neck:     Comments: No meningismus Cardiovascular:     Rate and Rhythm: Normal rate and regular rhythm.     Heart sounds: Normal heart sounds.  Pulmonary:     Effort: Pulmonary effort is normal. No respiratory distress.     Breath sounds: Normal breath sounds. No wheezing or rales.  Chest:     Chest wall: No tenderness.  Abdominal:     General: Bowel sounds are normal.     Palpations: Abdomen is soft.     Tenderness: There is no abdominal tenderness. There is no guarding or rebound.  Musculoskeletal:        General: Normal range of motion.     Cervical back: Normal range of motion and neck supple.  Lymphadenopathy:     Cervical: No cervical adenopathy.  Skin:    General: Skin is warm and dry.     Findings: No rash.  Neurological:     Mental Status: She is alert and oriented to person, place, and time.     Sensory: No sensory deficit.     Motor: No weakness.     ED Results / Procedures / Treatments   Labs (all labs ordered are listed, but only abnormal results are displayed) Labs Reviewed - No data to display  EKG None  Radiology No results found.  Procedures Procedures (including critical care time)  Medications Ordered in ED Medications  promethazine (PHENERGAN) injection 12.5 mg (has no  administration in time range)  diphenhydrAMINE (BENADRYL) injection 25 mg (25 mg Intravenous Given 09/12/19 2204)  promethazine (PHENERGAN) injection 12.5 mg (12.5 mg Intravenous Given 09/12/19 2205)  HYDROmorphone (DILAUDID) injection 1 mg (1 mg Intravenous Given 09/12/19 2205)    ED Course  I have reviewed the triage vital signs and the nursing notes.  Pertinent labs & imaging results that were available during my care of the patient were reviewed by me and considered in my medical decision making (see chart for details).    MDM Rules/Calculators/A&P                          Patient is a 38 year old female who presents with a migraine headache.  She said that her pain is the same type of pain she has typically with her migraines.  She does not have any symptoms that sound more concerning for subarachnoid hemorrhage or meningitis.  She was treated yesterday and got better but came back again.  She was given medications here for migraine.  She is feeling little bit better but still has a headache and does not feel quite ready to go home.  Try another dose of Phenergan.  She cannot take Reglan or Compazine.  Dr. Florina Ou to take over care pending revaluation.  Final Clinical Impression(s) / ED Diagnoses Final diagnoses:  Other migraine without status migrainosus, not intractable    Rx / DC Orders ED Discharge Orders    None       Malvin Johns, MD 09/12/19 2333

## 2019-09-12 NOTE — ED Triage Notes (Signed)
Migraine x 5 days with N/V. Has appt with neuro tomorrow. Normal meds are not working. Was seen yesterday and felt better but headache returned.

## 2019-09-12 NOTE — ED Provider Notes (Signed)
[facility] DEPT [facility] Provider Note: [name], MD, FACEP  CSN: [id number] MRN: [id number] ARRIVAL: 09/11/19 at 2146 ROOM: [place]  Migraine   HISTORY OF PRESENT ILLNESS  09/12/19 3:46 AM Berklie Dethlefs is a 39 y.o. female history of migraines and known pituitary adenoma.  She is here with a headache for the past 4 days which she describes as like previous migraines.  It is on the left side of her head and throbbing in nature.  She rates as an 8 out of 10.  It is associated with nausea and photophobia and mild left-sided weakness.  She had an Emgality shot 5 days ago, part of the series, which has not prevented it.  She states that usually takes the full series of 4 to stave off her headaches.  She has taken Ubrelvy and Zofran at home without relief.  She has taken Tylenol.  She reports inability to take any NSAIDs due to gastric hemorrhage.  She is not able to take Reglan because it is contraindicated due to interaction with cabergoline which she takes for hyperprolactinemia.  She is stable to take Phenergan.   Past Medical History:  Diagnosis Date   Anxiety    Asthma    Depression    GERD (gastroesophageal reflux disease)    History of chicken pox    History of migraine headaches    Seizures ([place])     Past Surgical History:  Procedure Laterality Date   APPENDECTOMY     CHOLECYSTECTOMY     Pituiary Tumor     Tumor Removal Pituitary    Family History  Problem Relation Age of Onset   Diabetes Mother    Migraines Mother    Hyperlipidemia Mother    Bipolar disorder Mother    Irritable bowel syndrome Mother    Diabetes Father    Colon cancer Father    Cancer Father        colon   Hyperlipidemia Father    Hypertension Father    Bipolar disorder Brother    Migraines Sister    Other Neg Hx        pituitary disorder   Esophageal cancer Neg Hx     Social History   Tobacco Use   Smoking status: Former Smoker     Packs/day: 1.00    Years: 15.00    Pack years: 15.00    Types: Cigarettes    Quit date: 01/24/2016    Years since quitting: 3.6   Smokeless tobacco: Never Used  Vaping Use   Vaping Use: Former   Devices: patient said she tried it 6 years ago and wasn't for her  Substance Use Topics   Alcohol use: No   Drug use: No    Prior to Admission medications   Medication Sig Start Date End Date Taking? Authorizing Provider  acetaminophen (TYLENOL) 500 MG tablet Take 500 mg by mouth as needed.    [provider]  cabergoline (DOSTINEX) 0.5 MG tablet Take 1 tablet (0.5 mg total) by mouth as directed. 1 mg twice a week and 0.5 mg once a week 08/29/19   Shamleffer, Melanie Crazier, MD  diphenhydrAMINE (BENADRYL) 50 MG tablet Take 50 mg by mouth at bedtime as needed for itching.    [provider]  escitalopram (LEXAPRO) 10 MG tablet Take 1 tablet (10 mg total) by mouth daily. 06/10/19   Shelda Pal, DO  famotidine (PEPCID) 20 MG tablet Take 20 mg by mouth 2 (two)  times daily.    [provider]  Galcanezumab-gnlm (EMGALITY) 120 MG/ML SOAJ Inject 120 mg into the skin every 28 (twenty-eight) days. 06/03/19   Tomi Likens, Adam R, DO  hydrOXYzine (ATARAX/VISTARIL) 25 MG tablet TAKE 1-3 TABLETS (25-75 MG TOTAL) BY MOUTH 3 (THREE) TIMES DAILY AS NEEDED FOR ANXIETY. 01/22/18   Shelda Pal, DO  Hyoscyamine Sulfate SL (LEVSIN/SL) 0.125 MG SUBL Place 0.125 mg under the tongue every 6 (six) hours as needed. 02/05/19   Cirigliano, Vito V, DO  magnesium oxide (MAG-OX) 400 MG tablet Take 400 mg by mouth daily.    [provider]  metFORMIN (GLUCOPHAGE) 500 MG tablet Take 1 tablet (500 mg total) by mouth every morning. 06/24/19   Renato Shin, MD  ondansetron (ZOFRAN ODT) 8 MG disintegrating tablet Take 1 tablet (8 mg total) by mouth every 8 (eight) hours as needed for nausea or vomiting. 06/19/19   Tomi Likens, Adam R, DO  pantoprazole (PROTONIX) 40 MG tablet Take 1  tablet (40 mg total) by mouth 2 (two) times daily. Patient will need an appointment for further refills 06/04/19   Cirigliano, Vito V, DO  QUEtiapine (SEROQUEL) 100 MG tablet Take 1 tablet (100 mg total) by mouth at bedtime. 08/28/19   Shelda Pal, DO  Riboflavin 400 MG CAPS Take 400 mg by mouth daily.    [provider]  topiramate (TOPAMAX) 100 MG tablet Take 1 tablet (100 mg total) by mouth 2 (two) times daily. 06/03/19   Pieter Partridge, DO  traMADol (ULTRAM) 50 MG tablet Take 1 tablet (50 mg total) by mouth every 6 (six) hours as needed. 06/03/19   Tomi Likens, Adam R, DO  Ubrogepant (UBRELVY) 100 MG TABS Take 1 tablet by mouth as needed (May repeat dose in 2 hours.  Maximum 2 tablets in 24 hours). 06/05/19   Pieter Partridge, DO    Allergies Aspirin, Chlorhexidine, Ibuprofen, Iodine, Lunesta [eszopiclone], Nsaids, Shellfish allergy, Sulfa antibiotics, and Tape   REVIEW OF SYSTEMS  Negative except as noted here or in the History of Present Illness.   PHYSICAL EXAMINATION  Initial Vital Signs Blood pressure 110/62, pulse 74, temperature 98.8 F (37.1 C), temperature source Oral, resp. rate 18, height 5\' 6"  (1.676 m), weight 117.9 kg, last menstrual period 09/05/2019, SpO2 99 %.  Examination General: Well-developed, well-nourished female in no acute distress; appearance consistent with age of record HENT: normocephalic; atraumatic Eyes: pupils equal, round and reactive to light; extraocular muscles intact; photophobia \ Neck: supple Heart: regular rate and rhythm Lungs: clear to auscultation bilaterally Abdomen: soft; nondistended; nontender; bowel sounds present Extremities: No deformity; full range of motion; pulses normal Neurologic: Awake, alert and oriented; slight left-sided weakness; no facial droop Skin: Warm and dry Psychiatric: Normal mood and affect   RESULTS  Summary of this visit's results, reviewed and interpreted by myself:   EKG  Interpretation  Date/Time:    Ventricular Rate:    PR Interval:    QRS Duration:   QT Interval:    QTC Calculation:   R Axis:     Text Interpretation:        Laboratory Studies: No results found for this or any previous visit (from the past 24 hour(s)). Imaging Studies: No results found.  ED COURSE and MDM  Nursing notes, initial and subsequent vitals signs, including pulse oximetry, reviewed and interpreted by myself.  Vitals:   09/11/19 2210 09/12/19 0301 09/12/19 0514  BP: 105/60 110/62 (!) 112/58  Pulse: 78 74 76  Resp: 16 18 18   Temp: 98.8 F (37.1 C) 98.8 F (37.1 C) 98.8 F (37.1 C)  TempSrc: Oral Oral Oral  SpO2: 98% 99% 97%  Weight: 117.9 kg    Height: 5\' 6"  (1.676 m)     Medications  sodium chloride 0.9 % bolus 1,000 mL (1,000 mLs Intravenous New Bag/Given 09/12/19 0440)  diphenhydrAMINE (BENADRYL) injection 25 mg (25 mg Intravenous Given 09/12/19 0440)  promethazine (PHENERGAN) injection 25 mg (25 mg Intravenous Given 09/12/19 0442)  HYDROmorphone (DILAUDID) injection 1 mg (1 mg Intravenous Given 09/12/19 0445)   5:17 AM Patient feels significantly better after IV fluids and medications.  She states she is ready to go home.  PROCEDURES  Procedures   ED DIAGNOSES     ICD-10-CM   1. Sporadic migraine  G43.[place]       Jaysean Manville, MD 09/12/19 [phone]

## 2019-09-13 NOTE — ED Provider Notes (Signed)
1:22 AM Patient feeling better, states she is ready to go home.  She has an appointment with her neurologist later today.   Levonne Carreras, Jenny Reichmann, MD 09/13/19 [phone]

## 2019-09-17 ENCOUNTER — Other Ambulatory Visit: Payer: Self-pay | Admitting: Neurology

## 2019-09-17 ENCOUNTER — Ambulatory Visit: Admitting: Family Medicine

## 2019-09-17 ENCOUNTER — Other Ambulatory Visit: Payer: Self-pay

## 2019-09-17 MED ORDER — NURTEC 75 MG PO TBDP: 75.0000 mg | ORAL_TABLET | ORAL | 5 refills | Status: DC | PRN

## 2019-09-20 ENCOUNTER — Encounter (HOSPITAL_BASED_OUTPATIENT_CLINIC_OR_DEPARTMENT_OTHER): Payer: Self-pay

## 2019-09-20 ENCOUNTER — Emergency Department (HOSPITAL_BASED_OUTPATIENT_CLINIC_OR_DEPARTMENT_OTHER)
Admission: EM | Admit: 2019-09-20 | Discharge: 2019-09-20 | Disposition: A | Discharge status: Home / Self Care | Attending: Emergency Medicine | Admitting: Emergency Medicine

## 2019-09-20 ENCOUNTER — Other Ambulatory Visit: Payer: Self-pay

## 2019-09-20 DIAGNOSIS — Z882 Allergy status to sulfonamides status: Secondary | ICD-10-CM | POA: Diagnosis not present

## 2019-09-20 DIAGNOSIS — Z793 Long term (current) use of hormonal contraceptives: Secondary | ICD-10-CM | POA: Insufficient documentation

## 2019-09-20 DIAGNOSIS — Z886 Allergy status to analgesic agent status: Secondary | ICD-10-CM | POA: Diagnosis not present

## 2019-09-20 DIAGNOSIS — E1165 Type 2 diabetes mellitus with hyperglycemia: Secondary | ICD-10-CM | POA: Diagnosis not present

## 2019-09-20 DIAGNOSIS — Z87891 Personal history of nicotine dependence: Secondary | ICD-10-CM | POA: Diagnosis not present

## 2019-09-20 DIAGNOSIS — R519 Headache, unspecified: Secondary | ICD-10-CM | POA: Diagnosis present

## 2019-09-20 DIAGNOSIS — J45909 Unspecified asthma, uncomplicated: Secondary | ICD-10-CM | POA: Insufficient documentation

## 2019-09-20 DIAGNOSIS — G43409 Hemiplegic migraine, not intractable, without status migrainosus: Secondary | ICD-10-CM

## 2019-09-20 MED ORDER — PROMETHAZINE HCL 25 MG/ML IJ SOLN
12.5000 mg | Freq: Once | INTRAMUSCULAR | Status: AC
  Filled 2019-09-20: qty 1

## 2019-09-20 MED ORDER — FENTANYL CITRATE (PF) 100 MCG/2ML IJ SOLN
50.0000 ug | Freq: Once | INTRAMUSCULAR | Status: AC
  Administered 2019-09-20: 50 ug via INTRAVENOUS
  Filled 2019-09-20: qty 2

## 2019-09-20 MED ORDER — SODIUM CHLORIDE 0.9 % IV BOLUS
500.0000 mL | Freq: Once | INTRAVENOUS | Status: AC
  Administered 2019-09-20: 500 mL via INTRAVENOUS

## 2019-09-20 MED ORDER — PROMETHAZINE HCL 25 MG/ML IJ SOLN
INTRAMUSCULAR | Status: AC
  Administered 2019-09-20: 12.5 mg via INTRAVENOUS
  Filled 2019-09-20: qty 1

## 2019-09-20 MED ORDER — DIPHENHYDRAMINE HCL 50 MG/ML IJ SOLN
25.0000 mg | Freq: Once | INTRAMUSCULAR | Status: AC
  Administered 2019-09-20: 25 mg via INTRAVENOUS
  Filled 2019-09-20: qty 1

## 2019-09-20 MED ORDER — HYDROMORPHONE HCL 1 MG/ML IJ SOLN
1.0000 mg | Freq: Once | INTRAMUSCULAR | Status: AC
  Administered 2019-09-20: 1 mg via INTRAVENOUS
  Filled 2019-09-20: qty 1

## 2019-09-20 MED ORDER — DEXAMETHASONE SODIUM PHOSPHATE 10 MG/ML IJ SOLN
10.0000 mg | Freq: Once | INTRAMUSCULAR | Status: AC
  Administered 2019-09-20: 10 mg via INTRAVENOUS
  Filled 2019-09-20: qty 1

## 2019-09-20 NOTE — ED Provider Notes (Signed)
[facility] DEPARTMENT Provider Note   CSN: [id number] Arrival date & time: 09/20/19  2036     History Chief Complaint  Patient presents with  . Migraine    Amber [name] is a 39 y.o. female.  Patient with history of migraine headaches, follows with Dr. Tomi Likens --presents with typical left-sided throbbing headache with associated light sensitivity, nausea and vomiting. Patient states that her headache started around 2 PM today. It is consistent with previous headaches. She took Nurtec this afternoon at approximately 530. She has taken this several times over the past week with good results. It did not improve her headache today, prompting emergency department visit. Patient has had several recent ED visits for the same. At this visit she was treated with Phenergan, Benadryl, Dilaudid, fluids. She does not recall receiving steroids in the past. She denies head injuries, fevers, confusion. No neck pain. No weakness, numbness, or tingling in the arms of the legs.        Past Medical History:  Diagnosis Date  . Anxiety   . Asthma   . Depression   . GERD (gastroesophageal reflux disease)   . History of chicken pox   . History of migraine headaches   . Seizures [facility])     Patient Active Problem List   Diagnosis Date Noted  . Hyperglycemia 06/05/2019  . Insomnia 01/07/2019  . Bilateral hand pain 11/09/2018  . Agoraphobia 01/18/2018  . Anxiety with depression 03/24/2017  . Migraine 05/11/2016  . Pituitary adenoma ([place]) 03/05/2016  . Hyperprolactinemia ([place]) 03/02/2016    Past Surgical History:  Procedure Laterality Date  . APPENDECTOMY    . CHOLECYSTECTOMY    . Pituiary Tumor     Tumor Removal Pituitary     OB History   No obstetric history on file.     Family History  Problem Relation Age of Onset  . Diabetes Mother   . Migraines Mother   . Hyperlipidemia Mother   . Bipolar disorder Mother   . Irritable bowel syndrome Mother   . Diabetes  Father   . Colon cancer Father   . Cancer Father        colon  . Hyperlipidemia Father   . Hypertension Father   . Bipolar disorder Brother   . Migraines Sister   . Other Neg Hx        pituitary disorder  . Esophageal cancer Neg Hx     Social History   Tobacco Use  . Smoking status: Former Smoker    Packs/day: 1.00    Years: 15.00    Pack years: 15.00    Types: Cigarettes    Quit date: 01/24/2016    Years since quitting: 3.6  . Smokeless tobacco: Never Used  Vaping Use  . Vaping Use: Former  Substance Use Topics  . Alcohol use: No  . Drug use: No    Home Medications Prior to Admission medications   Medication Sig Start Date End Date Taking? Authorizing Provider  acetaminophen (TYLENOL) 500 MG tablet Take 500 mg by mouth as needed.    [provider]  cabergoline (DOSTINEX) 0.5 MG tablet Take 1 tablet (0.5 mg total) by mouth as directed. 1 mg twice a week and 0.5 mg once a week 08/29/19   Shamleffer, Melanie Crazier, MD  diphenhydrAMINE (BENADRYL) 50 MG tablet Take 50 mg by mouth at bedtime as needed for itching.    [provider]  escitalopram (LEXAPRO) 10 MG tablet Take 1 tablet (10 mg  total) by mouth daily. 06/10/19   Shelda Pal, DO  famotidine (PEPCID) 20 MG tablet Take 20 mg by mouth 2 (two) times daily.    [provider]  Galcanezumab-gnlm (EMGALITY) 120 MG/ML SOAJ Inject 120 mg into the skin every 28 (twenty-eight) days. 06/03/19   Tomi Likens, Adam R, DO  hydrOXYzine (ATARAX/VISTARIL) 25 MG tablet TAKE 1-3 TABLETS (25-75 MG TOTAL) BY MOUTH 3 (THREE) TIMES DAILY AS NEEDED FOR ANXIETY. 01/22/18   Shelda Pal, DO  Hyoscyamine Sulfate SL (LEVSIN/SL) 0.125 MG SUBL Place 0.125 mg under the tongue every 6 (six) hours as needed. 02/05/19   Cirigliano, Vito V, DO  magnesium oxide (MAG-OX) 400 MG tablet Take 400 mg by mouth daily.    [provider]  metFORMIN (GLUCOPHAGE) 500 MG tablet Take 1 tablet (500 mg total) by mouth  every morning. 06/24/19   Renato Shin, MD  ondansetron (ZOFRAN ODT) 8 MG disintegrating tablet Take 1 tablet (8 mg total) by mouth every 8 (eight) hours as needed for nausea or vomiting. 06/19/19   Tomi Likens, Adam R, DO  pantoprazole (PROTONIX) 40 MG tablet Take 1 tablet (40 mg total) by mouth 2 (two) times daily. Patient will need an appointment for further refills 06/04/19   Cirigliano, Vito V, DO  QUEtiapine (SEROQUEL) 100 MG tablet Take 1 tablet (100 mg total) by mouth at bedtime. 08/28/19   Shelda Pal, DO  Riboflavin 400 MG CAPS Take 400 mg by mouth daily.    [provider]  Rimegepant Sulfate (NURTEC) 75 MG TBDP Take 75 mg by mouth as needed (Daily as needed for a Migraine). Maximum 1 tablet in 24 hours.  Quantity 8. 09/17/19   Tomi Likens, Adam R, DO  topiramate (TOPAMAX) 100 MG tablet Take 1 tablet (100 mg total) by mouth 2 (two) times daily. 06/03/19   Pieter Partridge, DO  traMADol (ULTRAM) 50 MG tablet Take 1 tablet (50 mg total) by mouth every 6 (six) hours as needed. 06/03/19   Tomi Likens, Adam R, DO  Ubrogepant (UBRELVY) 100 MG TABS Take 1 tablet by mouth as needed (May repeat dose in 2 hours.  Maximum 2 tablets in 24 hours). 06/05/19   Pieter Partridge, DO    Allergies    Aspirin, Chlorhexidine, Ibuprofen, Iodine, Lunesta [eszopiclone], Nsaids, Shellfish allergy, Sulfa antibiotics, and Tape  Review of Systems   Review of Systems  Constitutional: Negative for fever.  HENT: Negative for congestion, dental problem, rhinorrhea and sinus pressure.   Eyes: Positive for photophobia. Negative for discharge, redness and visual disturbance.  Respiratory: Negative for shortness of breath.   Cardiovascular: Negative for chest pain.  Gastrointestinal: Positive for nausea and vomiting.  Musculoskeletal: Negative for gait problem, neck pain and neck stiffness.  Skin: Negative for rash.  Neurological: Positive for headaches. Negative for syncope, speech difficulty, weakness, light-headedness and  numbness.  Psychiatric/Behavioral: Negative for confusion.    Physical Exam Updated Vital Signs BP 107/80 (BP Location: Left Arm)   Pulse 90   Temp 98.6 F (37 C) (Oral)   Resp 18   Ht 5\' 6"  (1.676 m)   Wt 118.4 kg   LMP 09/05/2019   SpO2 99%   BMI 42.13 kg/m   Physical Exam Vitals and nursing note reviewed.  Constitutional:      Appearance: She is well-developed.  HENT:     Head: Normocephalic and atraumatic.     Right Ear: Tympanic membrane, ear canal and external ear normal.     Left Ear:  Tympanic membrane, ear canal and external ear normal.     Nose: Nose normal.     Mouth/Throat:     Pharynx: Uvula midline.  Eyes:     General: Lids are normal.     Extraocular Movements:     Right eye: No nystagmus.     Left eye: No nystagmus.     Conjunctiva/sclera: Conjunctivae normal.     Pupils: Pupils are equal, round, and reactive to light.  Cardiovascular:     Rate and Rhythm: Normal rate and regular rhythm.  Pulmonary:     Effort: Pulmonary effort is normal.     Breath sounds: Normal breath sounds.  Abdominal:     Palpations: Abdomen is soft.     Tenderness: There is no abdominal tenderness.  Musculoskeletal:     Cervical back: Normal range of motion and neck supple. No tenderness or bony tenderness.  Skin:    General: Skin is warm and dry.  Neurological:     Mental Status: She is alert and oriented to person, place, and time.     GCS: GCS eye subscore is 4. GCS verbal subscore is 5. GCS motor subscore is 6.     Cranial Nerves: No cranial nerve deficit.     Sensory: No sensory deficit.     Coordination: Coordination normal.     Gait: Gait normal.     ED Results / Procedures / Treatments   Labs (all labs ordered are listed, but only abnormal results are displayed) Labs Reviewed - No data to display  EKG None  Radiology No results found.  Procedures Procedures (including critical care time)  Medications Ordered in ED Medications  promethazine  (PHENERGAN) injection 12.5 mg (has no administration in time range)  diphenhydrAMINE (BENADRYL) injection 25 mg (has no administration in time range)  fentaNYL (SUBLIMAZE) injection 50 mcg (has no administration in time range)  dexamethasone (DECADRON) injection 10 mg (has no administration in time range)  sodium chloride 0.9 % bolus 500 mL (has no administration in time range)    ED Course  I have reviewed the triage vital signs and the nursing notes.  Pertinent labs & imaging results that were available during my care of the patient were reviewed by me and considered in my medical decision making (see chart for details).  Patient seen and examined. Reviewed previous ED visit.  Medications ordered. Will reassess.   Vital signs reviewed and are as follows: BP 107/80 (BP Location: Left Arm)   Pulse 90   Temp 98.6 F (37 C) (Oral)   Resp 18   Ht 5\' 6"  (1.676 m)   Wt 118.4 kg   LMP 09/05/2019   SpO2 99%   BMI 42.13 kg/m   10:28 PM patient rechecked.  She states that nausea is improved.  However she states that she is dizzy, most likely from the fentanyl that she was given.  She states that this medicine has never done anything for her in the past.  She states that typically, the migraine cocktail that she receives when she comes here, includes 1 mg of Dilaudid.    We will give patient 1 mg of Dilaudid and then discharged home.  She states that her mother is coming to pick her up.  She has appropriate neurology follow-up.  She is comfortable with this plan.    MDM Rules/Calculators/A&P  Patient without high-risk features of headache including: sudden onset/thunderclap HA, no similar headache in past, altered mental status, accompanying seizure, headache with exertion, age > 70, history of immunocompromise, neck or shoulder pain, fever, use of anticoagulation, family history of spontaneous SAH, concomitant drug use, toxic exposure.   Patient has a normal  complete neurological exam, normal vital signs, normal level of consciousness, no signs of meningismus, is well-appearing/non-toxic appearing, no signs of trauma.   Imaging with CT/MRI not indicated given history and physical exam findings.   No dangerous or life-threatening conditions suspected or identified by history, physical exam, and by work-up. No indications for hospitalization identified.     Final Clinical Impression(s) / ED Diagnoses Final diagnoses:  Hemiplegic migraine without status migrainosus, not intractable    Rx / DC Orders ED Discharge Orders    None       Carlisle Cater, Hershal Coria 09/20/19 2230    Isla Pence, MD 09/20/19 2246

## 2019-09-20 NOTE — ED Notes (Signed)
Patient took Nurtec at approximately 1730 with no relief.

## 2019-09-20 NOTE — Discharge Instructions (Signed)
Please read and follow all provided instructions.  Your diagnoses today include:  1. Hemiplegic migraine without status migrainosus, not intractable     Tests performed today include:  CT of your head which was normal and did not show any serious cause of your headache  Vital signs. See below for your results today.   Medications:  In the Emergency Department you received:  Phenergan - antinausea/headache medication  Benadryl - antihistamine to counteract potential side effects of reglan  Decadron - medication to help prevent migraines from returning  Fentanyl/Dilaudid - narcotic pain medication  Take any prescribed medications only as directed.  Additional information:  Follow any educational materials contained in this packet.  You are having a headache. No specific cause was found today for your headache. It may have been a migraine or other cause of headache. Stress, anxiety, fatigue, and depression are common triggers for headaches.   Your headache today does not appear to be life-threatening or require hospitalization, but often the exact cause of headaches is not determined in the emergency department. Therefore, follow-up with your doctor is very important to find out what may have caused your headache and whether or not you need any further diagnostic testing or treatment.   Sometimes headaches can appear benign (not harmful), but then more serious symptoms can develop which should prompt an immediate re-evaluation by your doctor or the emergency department.  BE VERY CAREFUL not to take multiple medicines containing Tylenol (also called acetaminophen). Doing so can lead to an overdose which can damage your liver and cause liver failure and possibly death.   Follow-up instructions: Please follow-up with your primary care provider in the next 3 days for further evaluation of your symptoms.   Return instructions:   Please return to the Emergency Department if you  experience worsening symptoms.  Return if the medications do not resolve your headache, if it recurs, or if you have multiple episodes of vomiting or cannot keep down fluids.  Return if you have a change from the usual headache.  RETURN IMMEDIATELY IF you:  Develop a sudden, severe headache  Develop confusion or become poorly responsive or faint  Develop a fever above 100.29F or problem breathing  Have a change in speech, vision, swallowing, or understanding  Develop new weakness, numbness, tingling, incoordination in your arms or legs  Have a seizure  Please return if you have any other emergent concerns.  Additional Information:  Your vital signs today were: BP 107/80 (BP Location: Left Arm)   Pulse 90   Temp 98.6 F (37 C) (Oral)   Resp 18   Ht 5\' 6"  (1.676 m)   Wt 118.4 kg   LMP 09/05/2019   SpO2 99%   BMI 42.13 kg/m  If your blood pressure (BP) was elevated above 135/85 this visit, please have this repeated by your doctor within one month. --------------

## 2019-09-20 NOTE — ED Triage Notes (Signed)
Pt c/o migraine started 2pm-NAD-steady gait

## 2019-09-26 ENCOUNTER — Encounter (HOSPITAL_BASED_OUTPATIENT_CLINIC_OR_DEPARTMENT_OTHER): Payer: Self-pay | Admitting: *Deleted

## 2019-09-26 ENCOUNTER — Emergency Department (HOSPITAL_BASED_OUTPATIENT_CLINIC_OR_DEPARTMENT_OTHER)
Admission: EM | Admit: 2019-09-26 | Discharge: 2019-09-26 | Disposition: A | Discharge status: Home / Self Care | Attending: Emergency Medicine | Admitting: Emergency Medicine

## 2019-09-26 ENCOUNTER — Other Ambulatory Visit: Payer: Self-pay

## 2019-09-26 DIAGNOSIS — J45909 Unspecified asthma, uncomplicated: Secondary | ICD-10-CM | POA: Diagnosis not present

## 2019-09-26 DIAGNOSIS — Z7984 Long term (current) use of oral hypoglycemic drugs: Secondary | ICD-10-CM | POA: Diagnosis not present

## 2019-09-26 DIAGNOSIS — G43909 Migraine, unspecified, not intractable, without status migrainosus: Secondary | ICD-10-CM | POA: Diagnosis not present

## 2019-09-26 DIAGNOSIS — Z87891 Personal history of nicotine dependence: Secondary | ICD-10-CM | POA: Insufficient documentation

## 2019-09-26 DIAGNOSIS — R519 Headache, unspecified: Secondary | ICD-10-CM | POA: Diagnosis present

## 2019-09-26 DIAGNOSIS — Z79899 Other long term (current) drug therapy: Secondary | ICD-10-CM | POA: Insufficient documentation

## 2019-09-26 DIAGNOSIS — G43009 Migraine without aura, not intractable, without status migrainosus: Secondary | ICD-10-CM

## 2019-09-26 MED ORDER — SODIUM CHLORIDE 0.9 % IV BOLUS
1000.0000 mL | Freq: Once | INTRAVENOUS | Status: AC
  Administered 2019-09-26: 1000 mL via INTRAVENOUS

## 2019-09-26 MED ORDER — DIPHENHYDRAMINE HCL 50 MG/ML IJ SOLN
12.5000 mg | Freq: Once | INTRAMUSCULAR | Status: AC
  Administered 2019-09-26: 12.5 mg via INTRAVENOUS
  Filled 2019-09-26: qty 1

## 2019-09-26 MED ORDER — HYDROMORPHONE HCL 1 MG/ML IJ SOLN
1.0000 mg | Freq: Once | INTRAMUSCULAR | Status: AC
  Administered 2019-09-26: 1 mg via INTRAVENOUS
  Filled 2019-09-26: qty 1

## 2019-09-26 MED ORDER — PROMETHAZINE HCL 25 MG/ML IJ SOLN
25.0000 mg | Freq: Once | INTRAMUSCULAR | Status: AC
  Administered 2019-09-26: 25 mg via INTRAVENOUS
  Filled 2019-09-26: qty 1

## 2019-09-26 NOTE — ED Triage Notes (Signed)
Migraine headache since yesterday. light sensitive. Hx of migraines.

## 2019-09-26 NOTE — ED Notes (Signed)
ED Provider at bedside. 

## 2019-09-26 NOTE — ED Provider Notes (Signed)
[facility] Provider Note   CSN: [id number] Arrival date & time: 09/26/19  2040     History Chief Complaint  Patient presents with  . Headache    Amber [name] is a 39 y.o. female.  The history is provided by the patient.  Migraine This is a chronic problem. The problem occurs every several days. Associated symptoms include headaches. Pertinent negatives include no chest pain, no abdominal pain and no shortness of breath. Exacerbated by: light. Nothing relieves the symptoms. Treatments tried: rx meds. The treatment provided mild relief.       Past Medical History:  Diagnosis Date  . Anxiety   . Asthma   . Depression   . GERD (gastroesophageal reflux disease)   . History of chicken pox   . History of migraine headaches   . Seizures [facility])     Patient Active Problem List   Diagnosis Date Noted  . Hyperglycemia 06/05/2019  . Insomnia 01/07/2019  . Bilateral hand pain 11/09/2018  . Agoraphobia 01/18/2018  . Anxiety with depression 03/24/2017  . Migraine 05/11/2016  . Pituitary adenoma ([place]) 03/05/2016  . Hyperprolactinemia ([place]) 03/02/2016    Past Surgical History:  Procedure Laterality Date  . APPENDECTOMY    . CHOLECYSTECTOMY    . Pituiary Tumor     Tumor Removal Pituitary     OB History   No obstetric history on file.     Family History  Problem Relation Age of Onset  . Diabetes Mother   . Migraines Mother   . Hyperlipidemia Mother   . Bipolar disorder Mother   . Irritable bowel syndrome Mother   . Diabetes Father   . Colon cancer Father   . Cancer Father        colon  . Hyperlipidemia Father   . Hypertension Father   . Bipolar disorder Brother   . Migraines Sister   . Other Neg Hx        pituitary disorder  . Esophageal cancer Neg Hx     Social History   Tobacco Use  . Smoking status: Former Smoker    Packs/day: 1.00    Years: 15.00    Pack years: 15.00    Types: Cigarettes    Quit date: 01/24/2016     Years since quitting: 3.6  . Smokeless tobacco: Never Used  Vaping Use  . Vaping Use: Former  Substance Use Topics  . Alcohol use: No  . Drug use: No    Home Medications Prior to Admission medications   Medication Sig Start Date End Date Taking? Authorizing Provider  acetaminophen (TYLENOL) 500 MG tablet Take 500 mg by mouth as needed.    [provider]  cabergoline (DOSTINEX) 0.5 MG tablet Take 1 tablet (0.5 mg total) by mouth as directed. 1 mg twice a week and 0.5 mg once a week 08/29/19   Shamleffer, Melanie Crazier, MD  diphenhydrAMINE (BENADRYL) 50 MG tablet Take 50 mg by mouth at bedtime as needed for itching.    [provider]  escitalopram (LEXAPRO) 10 MG tablet Take 1 tablet (10 mg total) by mouth daily. 06/10/19   Shelda Pal, DO  famotidine (PEPCID) 20 MG tablet Take 20 mg by mouth 2 (two) times daily.    [provider]  Galcanezumab-gnlm (EMGALITY) 120 MG/ML SOAJ Inject 120 mg into the skin every 28 (twenty-eight) days. 06/03/19   Tomi Likens, Daren Doswell R, DO  hydrOXYzine (ATARAX/VISTARIL) 25 MG tablet TAKE 1-3 TABLETS (25-75 MG TOTAL) BY  MOUTH 3 (THREE) TIMES DAILY AS NEEDED FOR ANXIETY. 01/22/18   Shelda Pal, DO  Hyoscyamine Sulfate SL (LEVSIN/SL) 0.125 MG SUBL Place 0.125 mg under the tongue every 6 (six) hours as needed. 02/05/19   Cirigliano, Vito V, DO  magnesium oxide (MAG-OX) 400 MG tablet Take 400 mg by mouth daily.    [provider]  metFORMIN (GLUCOPHAGE) 500 MG tablet Take 1 tablet (500 mg total) by mouth every morning. 06/24/19   Renato Shin, MD  ondansetron (ZOFRAN ODT) 8 MG disintegrating tablet Take 1 tablet (8 mg total) by mouth every 8 (eight) hours as needed for nausea or vomiting. 06/19/19   Tomi Likens, Lark Langenfeld R, DO  pantoprazole (PROTONIX) 40 MG tablet Take 1 tablet (40 mg total) by mouth 2 (two) times daily. Patient will need an appointment for further refills 06/04/19   Cirigliano, Vito V, DO  QUEtiapine  (SEROQUEL) 100 MG tablet Take 1 tablet (100 mg total) by mouth at bedtime. 08/28/19   Shelda Pal, DO  Riboflavin 400 MG CAPS Take 400 mg by mouth daily.    [provider]  Rimegepant Sulfate (NURTEC) 75 MG TBDP Take 75 mg by mouth as needed (Daily as needed for a Migraine). Maximum 1 tablet in 24 hours.  Quantity 8. 09/17/19   Tomi Likens, Krystol Rocco R, DO  topiramate (TOPAMAX) 100 MG tablet Take 1 tablet (100 mg total) by mouth 2 (two) times daily. 06/03/19   Pieter Partridge, DO  traMADol (ULTRAM) 50 MG tablet Take 1 tablet (50 mg total) by mouth every 6 (six) hours as needed. 06/03/19   Tomi Likens, Mandeep Ferch R, DO  Ubrogepant (UBRELVY) 100 MG TABS Take 1 tablet by mouth as needed (May repeat dose in 2 hours.  Maximum 2 tablets in 24 hours). 06/05/19   Pieter Partridge, DO    Allergies    Aspirin, Chlorhexidine, Ibuprofen, Iodine, Lunesta [eszopiclone], Nsaids, Shellfish allergy, Sulfa antibiotics, and Tape  Review of Systems   Review of Systems  Constitutional: Negative for chills and fever.  HENT: Negative for ear pain and sore throat.   Eyes: Negative for pain and visual disturbance.  Respiratory: Negative for cough and shortness of breath.   Cardiovascular: Negative for chest pain and palpitations.  Gastrointestinal: Negative for abdominal pain and vomiting.  Genitourinary: Negative for dysuria and hematuria.  Musculoskeletal: Negative for arthralgias and back pain.  Skin: Negative for color change and rash.  Neurological: Positive for headaches. Negative for seizures and syncope.  All other systems reviewed and are negative.   Physical Exam Updated Vital Signs  ED Triage Vitals  Enc Vitals Group     BP 09/26/19 2054 127/81     Pulse Rate 09/26/19 2054 77     Resp 09/26/19 2054 20     Temp 09/26/19 2054 98.3 F (36.8 C)     Temp Source 09/26/19 2054 Oral     SpO2 09/26/19 2054 99 %     Weight 09/26/19 2052 261 lb (118.4 kg)     Height 09/26/19 2052 5\' 6"  (1.676 m)     Head  Circumference --      Peak Flow --      Pain Score 09/26/19 2051 9     Pain Loc --      Pain Edu? --      Excl. in [place]? --     Physical Exam Constitutional:      General: She is not in acute distress.    Appearance: She is not  ill-appearing.  HENT:     Head: Normocephalic.  Eyes:     Extraocular Movements: Extraocular movements intact.     Right eye: Normal extraocular motion.     Left eye: Normal extraocular motion.     Pupils: Pupils are equal, round, and reactive to light.  Musculoskeletal:        General: Normal range of motion.     Cervical back: Normal range of motion and neck supple.  Neurological:     Mental Status: She is alert and oriented to person, place, and time.     Cranial Nerves: No cranial nerve deficit, dysarthria or facial asymmetry.     Sensory: No sensory deficit.     Motor: No weakness.  Psychiatric:        Mood and Affect: Mood normal.     ED Results / Procedures / Treatments   Labs (all labs ordered are listed, but only abnormal results are displayed) Labs Reviewed - No data to display  EKG None  Radiology No results found.  Procedures Procedures (including critical care time)  Medications Ordered in ED Medications  sodium chloride 0.9 % bolus 1,000 mL (1,000 mLs Intravenous New Bag/Given 09/26/19 2131)  promethazine (PHENERGAN) injection 25 mg (25 mg Intravenous Given 09/26/19 2135)  HYDROmorphone (DILAUDID) injection 1 mg (1 mg Intravenous Given 09/26/19 2142)  diphenhydrAMINE (BENADRYL) injection 12.5 mg (12.5 mg Intravenous Given 09/26/19 2132)    ED Course  I have reviewed the triage vital signs and the nursing notes.  Pertinent labs & imaging results that were available during my care of the patient were reviewed by me and considered in my medical decision making (see chart for details).    MDM Rules/Calculators/A&P                          Amber [name] is a 39 year old female history of migraines who presents to the ED with  migraine.  Patient with normal vitals.  Overall normal neurological exam.  Given headache cocktail with good improvement.  Still with mild headache on reevaluation but this is expected given her significant migraine history.  She has multiple types of medication at home to help with her ongoing symptoms.  Recommend close follow-up with neurology.  Did have a recent MRI CT scan that were overall unremarkable.  Continues to have a pituitary adenoma.  Neurologically she appears intact and at baseline.  Discharged in ED in good condition.  This chart was dictated using voice recognition software.  Despite best efforts to proofread,  errors can occur which can change the documentation meaning.    Final Clinical Impression(s) / ED Diagnoses Final diagnoses:  Migraine without aura and without status migrainosus, not intractable    Rx / DC Orders ED Discharge Orders    None       Lennice Sites, DO 09/26/19 2232

## 2019-09-26 NOTE — Discharge Instructions (Signed)
Continue home migraine therapies.  Follow-up with neurology.

## 2019-10-01 ENCOUNTER — Emergency Department (HOSPITAL_BASED_OUTPATIENT_CLINIC_OR_DEPARTMENT_OTHER)

## 2019-10-01 ENCOUNTER — Emergency Department (HOSPITAL_BASED_OUTPATIENT_CLINIC_OR_DEPARTMENT_OTHER)
Admission: EM | Admit: 2019-10-01 | Discharge: 2019-10-01 | Disposition: A | Discharge status: Home / Self Care | Attending: Emergency Medicine | Admitting: Emergency Medicine

## 2019-10-01 ENCOUNTER — Encounter (HOSPITAL_BASED_OUTPATIENT_CLINIC_OR_DEPARTMENT_OTHER): Payer: Self-pay | Admitting: *Deleted

## 2019-10-01 ENCOUNTER — Other Ambulatory Visit: Payer: Self-pay

## 2019-10-01 DIAGNOSIS — M542 Cervicalgia: Secondary | ICD-10-CM | POA: Diagnosis not present

## 2019-10-01 DIAGNOSIS — Z87891 Personal history of nicotine dependence: Secondary | ICD-10-CM | POA: Insufficient documentation

## 2019-10-01 DIAGNOSIS — Z79899 Other long term (current) drug therapy: Secondary | ICD-10-CM | POA: Insufficient documentation

## 2019-10-01 DIAGNOSIS — R519 Headache, unspecified: Secondary | ICD-10-CM | POA: Diagnosis present

## 2019-10-01 DIAGNOSIS — G43809 Other migraine, not intractable, without status migrainosus: Secondary | ICD-10-CM | POA: Diagnosis not present

## 2019-10-01 DIAGNOSIS — J45909 Unspecified asthma, uncomplicated: Secondary | ICD-10-CM | POA: Insufficient documentation

## 2019-10-01 DIAGNOSIS — S46812A Strain of other muscles, fascia and tendons at shoulder and upper arm level, left arm, initial encounter: Secondary | ICD-10-CM

## 2019-10-01 LAB — CBC WITH DIFFERENTIAL/PLATELET
Abs Immature Granulocytes: 0.08 10*3/uL — ABNORMAL HIGH (ref 0.00–0.07)
Basophils Absolute: 0.1 10*3/uL (ref 0.0–0.1)
Basophils Relative: 0 %
Eosinophils Absolute: 0.1 10*3/uL (ref 0.0–0.5)
Eosinophils Relative: 1 %
HCT: 40.9 % (ref 36.0–46.0)
Hemoglobin: 13.6 g/dL (ref 12.0–15.0)
Immature Granulocytes: 1 %
Lymphocytes Relative: 34 %
Lymphs Abs: 3.8 10*3/uL (ref 0.7–4.0)
MCH: 29.1 pg (ref 26.0–34.0)
MCHC: 33.3 g/dL (ref 30.0–36.0)
MCV: 87.6 fL (ref 80.0–100.0)
Monocytes Absolute: 0.7 10*3/uL (ref 0.1–1.0)
Monocytes Relative: 7 %
Neutro Abs: 6.5 10*3/uL (ref 1.7–7.7)
Neutrophils Relative %: 57 %
Platelets: 354 10*3/uL (ref 150–400)
RBC: 4.67 MIL/uL (ref 3.87–5.11)
RDW: 13.3 % (ref 11.5–15.5)
WBC: 11.3 10*3/uL — ABNORMAL HIGH (ref 4.0–10.5)
nRBC: 0 % (ref 0.0–0.2)

## 2019-10-01 LAB — BASIC METABOLIC PANEL WITH GFR
Anion gap: 10 (ref 5–15)
BUN: 12 mg/dL (ref 6–20)
CO2: 24 mmol/L (ref 22–32)
Calcium: 8.9 mg/dL (ref 8.9–10.3)
Chloride: 104 mmol/L (ref 98–111)
Creatinine, Ser: 0.91 mg/dL (ref 0.44–1.00)
GFR calc Af Amer: >60 mL/min
GFR calc non Af Amer: >60 mL/min
Glucose, Bld: 91 mg/dL (ref 70–99)
Potassium: 3.8 mmol/L (ref 3.5–5.1)
Sodium: 138 mmol/L (ref 135–145)

## 2019-10-01 LAB — URINALYSIS, ROUTINE W REFLEX MICROSCOPIC
Bilirubin Urine: NEGATIVE
Glucose, UA: NEGATIVE mg/dL
Hgb urine dipstick: NEGATIVE
Ketones, ur: NEGATIVE mg/dL
Leukocytes,Ua: NEGATIVE
Nitrite: NEGATIVE
Protein, ur: NEGATIVE mg/dL
Specific Gravity, Urine: >1.03 — ABNORMAL HIGH (ref 1.005–1.030)
pH: 5 (ref 5.0–8.0)

## 2019-10-01 LAB — TROPONIN I (HIGH SENSITIVITY): Troponin I (High Sensitivity): <2 ng/L (ref <18)

## 2019-10-01 LAB — PREGNANCY, URINE: Preg Test, Ur: NEGATIVE

## 2019-10-01 MED ORDER — DEXAMETHASONE SODIUM PHOSPHATE 10 MG/ML IJ SOLN
10.0000 mg | Freq: Once | INTRAMUSCULAR | Status: AC
  Administered 2019-10-01: 10 mg via INTRAVENOUS
  Filled 2019-10-01: qty 1

## 2019-10-01 MED ORDER — PROMETHAZINE HCL 25 MG/ML IJ SOLN
INTRAMUSCULAR | Status: AC
  Filled 2019-10-01: qty 1

## 2019-10-01 MED ORDER — HYDROMORPHONE HCL 1 MG/ML IJ SOLN
1.0000 mg | Freq: Once | INTRAMUSCULAR | Status: AC
  Administered 2019-10-01: 1 mg via INTRAVENOUS
  Filled 2019-10-01: qty 1

## 2019-10-01 MED ORDER — SODIUM CHLORIDE 0.9 % IV BOLUS
1000.0000 mL | Freq: Once | INTRAVENOUS | Status: AC
  Administered 2019-10-01: 1000 mL via INTRAVENOUS

## 2019-10-01 MED ORDER — PREDNISONE 20 MG PO TABS
ORAL_TABLET | ORAL | 0 refills | Status: DC
Start: 2019-10-01 — End: 2019-12-24

## 2019-10-01 MED ORDER — PROMETHAZINE HCL 25 MG/ML IJ SOLN
25.0000 mg | Freq: Once | INTRAMUSCULAR | Status: AC
  Administered 2019-10-01: 25 mg via INTRAVENOUS

## 2019-10-01 MED ORDER — DIAZEPAM 5 MG/ML IJ SOLN
5.0000 mg | Freq: Once | INTRAMUSCULAR | Status: AC
  Administered 2019-10-01: 5 mg via INTRAVENOUS
  Filled 2019-10-01: qty 2

## 2019-10-01 NOTE — ED Provider Notes (Signed)
[place] EMERGENCY DEPARTMENT Provider Note   CSN: [id number] Arrival date & time: 10/01/19  1717     History Chief Complaint  Patient presents with  . Neck Pain  . Migraine    Amber [name] is a 39 y.o. female history of migraines on multiple medicines, seizures, here presenting with headache and neck pain.  She states that she woke up this morning and she had left-sided neck pain.  She states that the pain radiates down to her chest.  She states that she is on tramadol and took her tramadol and tizanidine with no relief.  She denies any trauma or injury.  Denies any arm numbness or weakness.  She states that then her head starts hurting.  Patient has a history of migraines and actually is on multiple medicines including injections.  Patient also had a recent MRI brain that was unremarkable.  The history is provided by the patient.       Past Medical History:  Diagnosis Date  . Anxiety   . Asthma   . Depression   . GERD (gastroesophageal reflux disease)   . History of chicken pox   . History of migraine headaches   . Seizures [facility])     Patient Active Problem List   Diagnosis Date Noted  . Hyperglycemia 06/05/2019  . Insomnia 01/07/2019  . Bilateral hand pain 11/09/2018  . Agoraphobia 01/18/2018  . Anxiety with depression 03/24/2017  . Migraine 05/11/2016  . Pituitary adenoma ([place]) 03/05/2016  . Hyperprolactinemia ([place]) 03/02/2016    Past Surgical History:  Procedure Laterality Date  . APPENDECTOMY    . CHOLECYSTECTOMY    . Pituiary Tumor     Tumor Removal Pituitary     OB History   No obstetric history on file.     Family History  Problem Relation Age of Onset  . Diabetes Mother   . Migraines Mother   . Hyperlipidemia Mother   . Bipolar disorder Mother   . Irritable bowel syndrome Mother   . Diabetes Father   . Colon cancer Father   . Cancer Father        colon  . Hyperlipidemia Father   . Hypertension Father   . Bipolar  disorder Brother   . Migraines Sister   . Other Neg Hx        pituitary disorder  . Esophageal cancer Neg Hx     Social History   Tobacco Use  . Smoking status: Former Smoker    Packs/day: 1.00    Years: 15.00    Pack years: 15.00    Types: Cigarettes    Quit date: 01/24/2016    Years since quitting: 3.6  . Smokeless tobacco: Never Used  Vaping Use  . Vaping Use: Former  Substance Use Topics  . Alcohol use: No  . Drug use: No    Home Medications Prior to Admission medications   Medication Sig Start Date End Date Taking? Authorizing Provider  acetaminophen (TYLENOL) 500 MG tablet Take 500 mg by mouth as needed.    [provider]  cabergoline (DOSTINEX) 0.5 MG tablet Take 1 tablet (0.5 mg total) by mouth as directed. 1 mg twice a week and 0.5 mg once a week 08/29/19   Shamleffer, Melanie Crazier, MD  diphenhydrAMINE (BENADRYL) 50 MG tablet Take 50 mg by mouth at bedtime as needed for itching.    [provider]  escitalopram (LEXAPRO) 10 MG tablet Take 1 tablet (10 mg total) by mouth daily. 06/10/19  Shelda Pal, DO  famotidine (PEPCID) 20 MG tablet Take 20 mg by mouth 2 (two) times daily.    [provider]  Galcanezumab-gnlm (EMGALITY) 120 MG/ML SOAJ Inject 120 mg into the skin every 28 (twenty-eight) days. 06/03/19   Tomi Likens, Adam R, DO  hydrOXYzine (ATARAX/VISTARIL) 25 MG tablet TAKE 1-3 TABLETS (25-75 MG TOTAL) BY MOUTH 3 (THREE) TIMES DAILY AS NEEDED FOR ANXIETY. 01/22/18   Shelda Pal, DO  Hyoscyamine Sulfate SL (LEVSIN/SL) 0.125 MG SUBL Place 0.125 mg under the tongue every 6 (six) hours as needed. 02/05/19   Cirigliano, Vito V, DO  magnesium oxide (MAG-OX) 400 MG tablet Take 400 mg by mouth daily.    [provider]  metFORMIN (GLUCOPHAGE) 500 MG tablet Take 1 tablet (500 mg total) by mouth every morning. 06/24/19   Renato Shin, MD  ondansetron (ZOFRAN ODT) 8 MG disintegrating tablet Take 1 tablet (8 mg total) by  mouth every 8 (eight) hours as needed for nausea or vomiting. 06/19/19   Tomi Likens, Adam R, DO  pantoprazole (PROTONIX) 40 MG tablet Take 1 tablet (40 mg total) by mouth 2 (two) times daily. Patient will need an appointment for further refills 06/04/19   Cirigliano, Vito V, DO  QUEtiapine (SEROQUEL) 100 MG tablet Take 1 tablet (100 mg total) by mouth at bedtime. 08/28/19   Shelda Pal, DO  Riboflavin 400 MG CAPS Take 400 mg by mouth daily.    [provider]  Rimegepant Sulfate (NURTEC) 75 MG TBDP Take 75 mg by mouth as needed (Daily as needed for a Migraine). Maximum 1 tablet in 24 hours.  Quantity 8. 09/17/19   Tomi Likens, Adam R, DO  topiramate (TOPAMAX) 100 MG tablet Take 1 tablet (100 mg total) by mouth 2 (two) times daily. 06/03/19   Pieter Partridge, DO  traMADol (ULTRAM) 50 MG tablet Take 1 tablet (50 mg total) by mouth every 6 (six) hours as needed. 06/03/19   Tomi Likens, Adam R, DO  Ubrogepant (UBRELVY) 100 MG TABS Take 1 tablet by mouth as needed (May repeat dose in 2 hours.  Maximum 2 tablets in 24 hours). 06/05/19   Pieter Partridge, DO    Allergies    Aspirin, Chlorhexidine, Ibuprofen, Iodine, Lunesta [eszopiclone], Nsaids, Shellfish allergy, Sulfa antibiotics, and Tape  Review of Systems   Review of Systems  Musculoskeletal: Positive for neck pain.  All other systems reviewed and are negative.   Physical Exam Updated Vital Signs BP 111/72 (BP Location: Right Arm)   Pulse 69   Temp 98.3 F (36.8 C) (Oral)   Resp 20   Ht 5\' 6"  (1.676 m)   Wt 118.4 kg   LMP 09/05/2019   SpO2 100%   BMI 42.13 kg/m   Physical Exam Vitals and nursing note reviewed.  Constitutional:      Appearance: Normal appearance.  HENT:     Head: Normocephalic.     Nose: Nose normal.     Mouth/Throat:     Mouth: Mucous membranes are moist.  Eyes:     Extraocular Movements: Extraocular movements intact.     Pupils: Pupils are equal, round, and reactive to light.  Neck:     Comments: Left  paracervical tenderness.  No midline tenderness. Cardiovascular:     Rate and Rhythm: Normal rate and regular rhythm.     Pulses: Normal pulses.  Pulmonary:     Effort: Pulmonary effort is normal.     Breath sounds: Normal breath sounds.  Abdominal:  General: Abdomen is flat.     Palpations: Abdomen is soft.  Musculoskeletal:     Comments: Left trapezius tenderness and left chest wall tenderness.  Skin:    General: Skin is warm.     Capillary Refill: Capillary refill takes less than 2 seconds.  Neurological:     General: No focal deficit present.     Mental Status: She is alert and oriented to person, place, and time.     Comments: Normal strength and sensation bilaterally.  No obvious facial droop.  Psychiatric:        Mood and Affect: Mood normal.     ED Results / Procedures / Treatments   Labs (all labs ordered are listed, but only abnormal results are displayed) Labs Reviewed  CBC WITH DIFFERENTIAL/PLATELET - Abnormal; Notable for the following components:      Result Value   WBC 11.3 (*)    Abs Immature Granulocytes 0.08 (*)    All other components within normal limits  URINALYSIS, ROUTINE W REFLEX MICROSCOPIC - Abnormal; Notable for the following components:   APPearance HAZY (*)    Specific Gravity, Urine >1.030 (*)    All other components within normal limits  PREGNANCY, URINE  BASIC METABOLIC PANEL  TROPONIN I (HIGH SENSITIVITY)    EKG None  Radiology No results found.  Procedures Procedures (including critical care time)  Medications Ordered in ED Medications  promethazine (PHENERGAN) 25 MG/ML injection (has no administration in time range)  promethazine (PHENERGAN) injection 25 mg (25 mg Intravenous Given 10/01/19 2146)  sodium chloride 0.9 % bolus 1,000 mL (1,000 mLs Intravenous New Bag/Given 10/01/19 2145)  diazepam (VALIUM) injection 5 mg (5 mg Intravenous Given 10/01/19 2146)  HYDROmorphone (DILAUDID) injection 1 mg (1 mg Intravenous Given  10/01/19 2146)  dexamethasone (DECADRON) injection 10 mg (10 mg Intravenous Given 10/01/19 2145)    ED Course  I have reviewed the triage vital signs and the nursing notes.  Pertinent labs & imaging results that were available during my care of the patient were reviewed by me and considered in my medical decision making (see chart for details).    MDM Rules/Calculators/A&P                          Jaquaya Coyle is a 39 y.o. female here presenting with left-sided neck pain and headache.  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  I reviewed her chart from previous visits and patient usually needs some Phenergan and steroids.  She has nonfocal neuro exam right now.  I think she likely has cervical radiculopathy or muscle spasms.  Plan to get CBC, BMP, CT cervical spine.  Will give migraine cocktail and also pain medicine and muscle relaxants.  11:07 PM Labs and CT cervical spine unremarkable.  Pain improved in the ED.  Headache improved.  I think likely muscle spasms.  Patient has tramadol and tizanidine at home.  I told her that if she needs stronger pain medicine, she needs to talk to her doctor.  Stable for discharge.  Final Clinical Impression(s) / ED Diagnoses Final diagnoses:  None    Rx / DC Orders ED Discharge Orders    None       Drenda Freeze, MD 10/01/19 2308

## 2019-10-01 NOTE — ED Triage Notes (Signed)
She woke with pain in her neck and left shoulder. Pain is worse when she attempts to turn her head.

## 2019-10-01 NOTE — Discharge Instructions (Signed)
You likely have muscle strain in your neck area.  Please continue taking your pain medicine and muscle relaxant as prescribed by your doctor.  Take prednisone as prescribed.  See your doctor for follow-up.  If you need stronger pain medicine, please let your doctor know.  Return to ER if you have worse neck pain, headache, numbness, weakness, fevers

## 2019-10-02 ENCOUNTER — Ambulatory Visit (HOSPITAL_BASED_OUTPATIENT_CLINIC_OR_DEPARTMENT_OTHER)
Admission: RE | Admit: 2019-10-02 | Discharge: 2019-10-02 | Disposition: A | Discharge status: Home / Self Care | Source: Ambulatory Visit | Attending: Internal Medicine | Admitting: Internal Medicine

## 2019-10-02 DIAGNOSIS — I359 Nonrheumatic aortic valve disorder, unspecified: Secondary | ICD-10-CM | POA: Diagnosis not present

## 2019-10-02 DIAGNOSIS — E221 Hyperprolactinemia: Secondary | ICD-10-CM

## 2019-10-02 DIAGNOSIS — D352 Benign neoplasm of pituitary gland: Secondary | ICD-10-CM

## 2019-10-03 ENCOUNTER — Telehealth: Payer: Self-pay | Admitting: Internal Medicine

## 2019-10-03 NOTE — Progress Notes (Addendum)
Virtual Visit via Video Note The purpose of this virtual visit is to provide medical care while limiting exposure to the novel coronavirus.    Consent was obtained for video visit:  Yes.   Answered questions that patient had about telehealth interaction:  Yes.   I discussed the limitations, risks, security and privacy concerns of performing an evaluation and management service by telemedicine. I also discussed with the patient that there may be a patient responsible charge related to this service. The patient expressed understanding and agreed to proceed.  Pt location: Home Physician Location: office Name of referring provider:  Shelda Pal* I connected with Amber [name] at patients initiation/request on 10/04/2019 at 10:50 AM EDT by video enabled telemedicine application and verified that I am speaking with the correct person using two identifiers. 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  ED note reviewed.  UPDATE: Started Emgality in March.  The week prior and week after the shot, she has increased migraines.   Intensity:moderate-severe Duration:1 hour with Nurtec Frequency: 8 a month (within the weeks before and after injection) Frequency of abortive: 2 to 3 days a week Rescue protocol:Zofran, Tylenol (with tramadol if severe) Current NSAIDS:no Current analgesics:Tylenol; tramadol Current triptans:no Current ergot: Dostinex (for hyperprolactinemia) Current anti-emetic:Zofran 8mg  Current muscle relaxants:no Current anti-anxiolytic:hydroxyzine Current sleep aide:quetiapine 50mg  Current Antihypertensive medications:no Current Antidepressant medications:Lexapro 10mg  Current Anticonvulsant  medications:topiramate100mg  twice daily Current CGRP inhibitor:  Emgality, Nurtec Current Vitamins/Herbal/Supplements:magnesium oxide 400mg , riboflavin 400mg  Current Antihistamines/Decongestants: Benadryl Other therapy:no  Caffeine:1 cup coffee rarely Alcohol:no Smoker:no Diet:60 oz water daily. Little fast food. No soda. Eats chicken and fish.  Not skipping meals.   Exercise:no Depression/anxiety:yes Other pain: On 10/01/2019, she woke up with left sided neck pain radiating down the left arm.  Tramadol and tizanidine was ineffective.  She did not recall straining her neck.  She went to the ED where she was diagnosed with trapezius muscle strain and cervical radiculopathy.  CT cervical spine personally reviewed showed minor degenerative disc disease at C6-C7 but no spinal or neural foraminal stenosis.  She is currently on a prednisone taper.   Sleep hygiene:Improved with quetiapine  Prolactin level still elevated, however MRI of pituitary with and without contrast from 08/31/2019 personally reviewed showed regression of the microadenoma since 2018.  Workup is ongoing.  She is undergoing cardiac evaluation as well.  HISTORY: Migraines Onset: Childhood Location:Left sided Quality:throbbing Initial intensity:severe Aura:no Prodrome:Feels ill for 3 days prior Postdrome:"hangover" effect for 1 day after Associated symptoms:Left sided numbnessof face, arm and leg with slight weakness of left arm and leg. Nausea, photophobia, and phonophobia. She has not had any new worse headache of her life, waking up from sleep InitialDuration:Several hours to several days. Over the summer, she had status migrainosis lasting 13-14 weeks. InitialFrequency:3 to 4 days a week InitialFrequency of abortive medication:3 to 4 days a week Triggers/aggravating factors: Sleep deprivation, shifting positions Relieving factors: Laying down in dark and quiet  room Activity:Aggravates.  Past NSAIDS:Ibuprofen. Cannot take NSAIDs due to GI bleed Past analgesics:Tramadol (decreases intensity), Excedrin Past abortive triptans/ergot:Sumatriptan (increased headache) Past muscle relaxants:no Past anti-emetic: Reglan(contraindicated with cabergoline), Zofran 4mg , Promethazine Past antihypertensive medications:Propranolol 80mg  (hypotension) Past antidepressant medications:Prozac; venlafaxine XR150mg  daily  Past anticonvulsant medications:no Past CGRP inhibitors:  Roselyn Meier Past vitamins/Herbal/Supplements:no Past antihistamines/decongestants:no  Other past therapies:no Unable to afford Botox with her insurance plan.  Cluster Headache: Right orbital, stabbing, severe, associated with ptosis and conjunctival injection, lasting several hours and occurring 1 to 2 times a month.  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  Past Medical History: Past Medical History:  Diagnosis Date  . Anxiety   . Asthma   . Depression   . GERD (gastroesophageal reflux disease)   . History of chicken pox   . History of migraine headaches   . Seizures ([place])     Medications: Outpatient Encounter Medications as of 10/04/2019  Medication Sig  . acetaminophen (TYLENOL) 500 MG tablet Take 500 mg by mouth as needed.  . cabergoline (DOSTINEX) 0.5 MG tablet Take 1 tablet (0.5 mg total) by mouth as directed. 1 mg twice a week and 0.5 mg once a week  . diphenhydrAMINE (BENADRYL) 50 MG tablet Take 50 mg by mouth at bedtime as needed for itching.  . escitalopram (LEXAPRO) 10 MG tablet Take 1 tablet (10 mg total) by mouth daily.  . famotidine (PEPCID) 20 MG tablet Take 20 mg by mouth 2 (two) times daily.  . Galcanezumab-gnlm (EMGALITY) 120 MG/ML SOAJ Inject 120 mg into the skin every  28 (twenty-eight) days.  . hydrOXYzine (ATARAX/VISTARIL) 25 MG tablet TAKE 1-3 TABLETS (25-75 MG TOTAL) BY MOUTH 3 (THREE) TIMES DAILY AS NEEDED FOR ANXIETY.  Marland Kitchen Hyoscyamine Sulfate SL (LEVSIN/SL) 0.125 MG SUBL Place 0.125 mg under the tongue every 6 (six) hours as needed.  . magnesium oxide (MAG-OX) 400 MG tablet Take 400 mg by mouth daily.  . metFORMIN (GLUCOPHAGE) 500 MG tablet Take 1 tablet (500 mg total) by mouth every morning.  . ondansetron (ZOFRAN ODT) 8 MG disintegrating tablet Take 1 tablet (8 mg total) by mouth every 8 (eight) hours as needed for nausea or vomiting.  . pantoprazole (PROTONIX) 40 MG tablet Take 1 tablet (40 mg total) by mouth 2 (two) times daily. Patient will need an appointment for further refills  . predniSONE (DELTASONE) 20 MG tablet Take 60 mg daily x 2 days then 40 mg daily x 2 days then 20 mg daily x 2 days  . QUEtiapine (SEROQUEL) 100 MG tablet Take 1 tablet (100 mg total) by mouth at bedtime.  . Riboflavin 400 MG CAPS Take 400 mg by mouth daily.  . Rimegepant Sulfate (NURTEC) 75 MG TBDP Take 75 mg by mouth as needed (Daily as needed for a Migraine). Maximum 1 tablet in 24 hours.  Quantity 8.  . topiramate (TOPAMAX) 100 MG tablet Take 1 tablet (100 mg total) by mouth 2 (two) times daily.  . traMADol (ULTRAM) 50 MG tablet Take 1 tablet (50 mg total) by mouth every 6 (six) hours as needed.  Marland Kitchen Ubrogepant (UBRELVY) 100 MG TABS Take 1 tablet by mouth as needed (May repeat dose in 2 hours.  Maximum 2 tablets in 24 hours).   No facility-administered encounter medications on file as of 10/04/2019.    Allergies: Allergies  Allergen Reactions  . Aspirin   . Chlorhexidine   . Ibuprofen   . Iodine   . Lunesta [Eszopiclone] Other (See Comments)    GI issues  . Nsaids Nausea And Vomiting  . Shellfish Allergy     Tingle in mouth/throat  . Sulfa Antibiotics   . Tape Rash    Blisters  Family History: Family History  Problem Relation Age of Onset  . Diabetes  Mother   . Migraines Mother   . Hyperlipidemia Mother   . Bipolar disorder Mother   . Irritable bowel syndrome Mother   . Diabetes Father   . Colon cancer Father   . Cancer Father        colon  . Hyperlipidemia Father   . Hypertension Father   . Bipolar disorder Brother   . Migraines Sister   . Other Neg Hx        pituitary disorder  . Esophageal cancer Neg Hx     Social History: Social History   Socioeconomic History  . Marital status: Married    Spouse name: Jenny Reichmann  . Number of children: 0  . Years of education: Not on file  . Highest education level: 12th grade  Occupational History  . Occupation: unemployed  Tobacco Use  . Smoking status: Former Smoker    Packs/day: 1.00    Years: 15.00    Pack years: 15.00    Types: Cigarettes    Quit date: 01/24/2016    Years since quitting: 3.6  . Smokeless tobacco: Never Used  Vaping Use  . Vaping Use: Former  Substance and Sexual Activity  . Alcohol use: No  . Drug use: No  . Sexual activity: Not on file  Other Topics Concern  . Not on file  Social History Narrative   Patient is right-handed. She lives with her husband in a 2 level home. She drinks I cup of coffee a day. She walks daily, and rides her recumbent bike QOD.   Social Determinants of Health   Financial Resource Strain:   . Difficulty of Paying Living Expenses:   Food Insecurity:   . Worried About Charity fundraiser in the Last Year:   . Arboriculturist in the Last Year:   Transportation Needs:   . Film/video editor (Medical):   Marland Kitchen Lack of Transportation (Non-Medical):   Physical Activity:   . Days of Exercise per Week:   . Minutes of Exercise per Session:   Stress:   . Feeling of Stress :   Social Connections:   . Frequency of Communication with Friends and Family:   . Frequency of Social Gatherings with Friends and Family:   . Attends Religious Services:   . Active Member of Clubs or Organizations:   . Attends Archivist Meetings:    Marland Kitchen Marital Status:   Intimate Partner Violence:   . Fear of Current or Ex-Partner:   . Emotionally Abused:   Marland Kitchen Physically Abused:   . Sexually Abused:     Observations/Objective:   Last menstrual period 09/05/2019. No acute distress.  Alert and oriented.  Speech fluent and not dysarthric.  Language intact.   Assessment and Plan:   1.  Hemiplegic migraine 2.  Left sided cervical radiculopathy 3.  Pituitary microadenoma  1.  Emgality monthly and topiramate 100mg  twice daily 2.  Nurtec for migraine rescue 3.  Finish prednisone taper.  If neck pain not improved, then will send to physical therapy. 4.  Limit use of pain relievers to no more than 2 days out of week to prevent risk of rebound or medication-overuse headache. 5.  Keep headache diary 6.  Follow up in 6 months.  Follow Up Instructions:    -I discussed the assessment and treatment plan with the patient. The patient was provided an opportunity to ask questions and all  were answered. The patient agreed with the plan and demonstrated an understanding of the instructions.   The patient was advised to call back or seek an in-person evaluation if the symptoms worsen or if the condition fails to improve as anticipated.   Dudley Major, DO

## 2019-10-03 NOTE — Telephone Encounter (Signed)
Attempted to call the pt on 10/03/2019 at 9:50 AM, call went straight to voice mail.  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

## 2019-10-04 ENCOUNTER — Telehealth (INDEPENDENT_AMBULATORY_CARE_PROVIDER_SITE_OTHER): Admitting: Neurology

## 2019-10-04 ENCOUNTER — Encounter: Payer: Self-pay | Admitting: Neurology

## 2019-10-04 ENCOUNTER — Other Ambulatory Visit: Payer: Self-pay

## 2019-10-04 DIAGNOSIS — G43409 Hemiplegic migraine, not intractable, without status migrainosus: Secondary | ICD-10-CM | POA: Diagnosis not present

## 2019-10-04 DIAGNOSIS — D352 Benign neoplasm of pituitary gland: Secondary | ICD-10-CM | POA: Diagnosis not present

## 2019-10-04 DIAGNOSIS — M5412 Radiculopathy, cervical region: Secondary | ICD-10-CM

## 2019-10-04 NOTE — Patient Instructions (Signed)
1.  Emgality monthly and topiramate 100mg  twice daily 2.  Roselyn Meier for migraine rescue 3.  Finish prednisone taper.  If neck pain not improved, then will send to physical therapy. 4.  Limit use of pain relievers to no more than 2 days out of week to prevent risk of rebound or medication-overuse headache. 5.  Keep headache diary 6.  Follow up in 6 months.

## 2019-10-07 ENCOUNTER — Other Ambulatory Visit: Payer: Self-pay | Admitting: Internal Medicine

## 2019-10-07 DIAGNOSIS — R931 Abnormal findings on diagnostic imaging of heart and coronary circulation: Secondary | ICD-10-CM

## 2019-10-08 ENCOUNTER — Emergency Department (HOSPITAL_BASED_OUTPATIENT_CLINIC_OR_DEPARTMENT_OTHER)
Admission: EM | Admit: 2019-10-08 | Discharge: 2019-10-09 | Disposition: A | Discharge status: Home / Self Care | Attending: Emergency Medicine | Admitting: Emergency Medicine

## 2019-10-08 ENCOUNTER — Encounter (HOSPITAL_BASED_OUTPATIENT_CLINIC_OR_DEPARTMENT_OTHER): Payer: Self-pay | Admitting: *Deleted

## 2019-10-08 ENCOUNTER — Other Ambulatory Visit: Payer: Self-pay

## 2019-10-08 DIAGNOSIS — Z87891 Personal history of nicotine dependence: Secondary | ICD-10-CM | POA: Insufficient documentation

## 2019-10-08 DIAGNOSIS — J45909 Unspecified asthma, uncomplicated: Secondary | ICD-10-CM | POA: Diagnosis not present

## 2019-10-08 DIAGNOSIS — R519 Headache, unspecified: Secondary | ICD-10-CM

## 2019-10-08 MED ORDER — PROMETHAZINE HCL 25 MG/ML IJ SOLN
25.0000 mg | Freq: Once | INTRAMUSCULAR | Status: AC
  Administered 2019-10-08: 25 mg via INTRAVENOUS
  Filled 2019-10-08: qty 1

## 2019-10-08 MED ORDER — SODIUM CHLORIDE 0.9 % IV BOLUS
500.0000 mL | Freq: Once | INTRAVENOUS | Status: AC
  Administered 2019-10-08: 500 mL via INTRAVENOUS

## 2019-10-08 MED ORDER — DIPHENHYDRAMINE HCL 50 MG/ML IJ SOLN
25.0000 mg | Freq: Once | INTRAMUSCULAR | Status: AC
  Administered 2019-10-08: 25 mg via INTRAVENOUS
  Filled 2019-10-08: qty 1

## 2019-10-08 NOTE — ED Notes (Signed)
ED Provider at bedside. 

## 2019-10-08 NOTE — ED Provider Notes (Signed)
[facility] EMERGENCY DEPARTMENT Provider Note   CSN: [id number] Arrival date & time: 10/08/19  2207     History Chief Complaint  Patient presents with  . Headache    Amber [name] is a 39 y.o. female.  The history is provided by the patient and medical records. No language interpreter was used.  Headache  Amber [name] is a 39 y.o. female who presents to the Emergency Department complaining of headache.  She presents to the ED complaining of HA that started this morning (2am).  She takes emgality - last dose Saturday.  Took a nurteq this morning.  HA paritally improved with nurteq only to worsen two hours ago.  HA is left sided, behind left eye, throbbing and stabbing.  She has associated nausea, vomiting, light sensitivity.  Typical for migraines.  Pain is typical for her HAs.      Past Medical History:  Diagnosis Date  . Anxiety   . Asthma   . Depression   . GERD (gastroesophageal reflux disease)   . History of chicken pox   . History of migraine headaches   . Seizures [facility])     Patient Active Problem List   Diagnosis Date Noted  . Hyperglycemia 06/05/2019  . Insomnia 01/07/2019  . Bilateral hand pain 11/09/2018  . Agoraphobia 01/18/2018  . Anxiety with depression 03/24/2017  . Migraine 05/11/2016  . Pituitary adenoma ([place]) 03/05/2016  . Hyperprolactinemia ([place]) 03/02/2016    Past Surgical History:  Procedure Laterality Date  . APPENDECTOMY    . CHOLECYSTECTOMY    . Pituiary Tumor     Tumor Removal Pituitary     OB History   No obstetric history on file.     Family History  Problem Relation Age of Onset  . Diabetes Mother   . Migraines Mother   . Hyperlipidemia Mother   . Bipolar disorder Mother   . Irritable bowel syndrome Mother   . Diabetes Father   . Colon cancer Father   . Cancer Father        colon  . Hyperlipidemia Father   . Hypertension Father   . Bipolar disorder Brother   . Migraines Sister   . Other Neg Hx         pituitary disorder  . Esophageal cancer Neg Hx     Social History   Tobacco Use  . Smoking status: Former Smoker    Packs/day: 1.00    Years: 15.00    Pack years: 15.00    Types: Cigarettes    Quit date: 01/24/2016    Years since quitting: 3.7  . Smokeless tobacco: Never Used  Vaping Use  . Vaping Use: Former  Substance Use Topics  . Alcohol use: No  . Drug use: No    Home Medications Prior to Admission medications   Medication Sig Start Date End Date Taking? Authorizing Provider  acetaminophen (TYLENOL) 500 MG tablet Take 500 mg by mouth as needed.    [provider]  cabergoline (DOSTINEX) 0.5 MG tablet Take 1 tablet (0.5 mg total) by mouth as directed. 1 mg twice a week and 0.5 mg once a week 08/29/19   Shamleffer, Melanie Crazier, MD  diphenhydrAMINE (BENADRYL) 50 MG tablet Take 50 mg by mouth at bedtime as needed for itching.    [provider]  escitalopram (LEXAPRO) 10 MG tablet Take 1 tablet (10 mg total) by mouth daily. 06/10/19   Shelda Pal, DO  famotidine (PEPCID) 20 MG tablet Take 20  mg by mouth 2 (two) times daily.    [provider]  Galcanezumab-gnlm (EMGALITY) 120 MG/ML SOAJ Inject 120 mg into the skin every 28 (twenty-eight) days. 06/03/19   Tomi Likens, Adam R, DO  hydrOXYzine (ATARAX/VISTARIL) 25 MG tablet TAKE 1-3 TABLETS (25-75 MG TOTAL) BY MOUTH 3 (THREE) TIMES DAILY AS NEEDED FOR ANXIETY. 01/22/18   Shelda Pal, DO  Hyoscyamine Sulfate SL (LEVSIN/SL) 0.125 MG SUBL Place 0.125 mg under the tongue every 6 (six) hours as needed. 02/05/19   Cirigliano, Vito V, DO  magnesium oxide (MAG-OX) 400 MG tablet Take 400 mg by mouth daily.    [provider]  metFORMIN (GLUCOPHAGE) 500 MG tablet Take 1 tablet (500 mg total) by mouth every morning. 06/24/19   Renato Shin, MD  ondansetron (ZOFRAN ODT) 8 MG disintegrating tablet Take 1 tablet (8 mg total) by mouth every 8 (eight) hours as needed for nausea or vomiting.  06/19/19   Tomi Likens, Adam R, DO  pantoprazole (PROTONIX) 40 MG tablet Take 1 tablet (40 mg total) by mouth 2 (two) times daily. Patient will need an appointment for further refills 06/04/19   Cirigliano, Vito V, DO  predniSONE (DELTASONE) 20 MG tablet Take 60 mg daily x 2 days then 40 mg daily x 2 days then 20 mg daily x 2 days 10/01/19   Drenda Freeze, MD  QUEtiapine (SEROQUEL) 100 MG tablet Take 1 tablet (100 mg total) by mouth at bedtime. 08/28/19   Shelda Pal, DO  Riboflavin 400 MG CAPS Take 400 mg by mouth daily.    [provider]  Rimegepant Sulfate (NURTEC) 75 MG TBDP Take 75 mg by mouth as needed (Daily as needed for a Migraine). Maximum 1 tablet in 24 hours.  Quantity 8. 09/17/19   Tomi Likens, Adam R, DO  topiramate (TOPAMAX) 100 MG tablet Take 1 tablet (100 mg total) by mouth 2 (two) times daily. 06/03/19   Pieter Partridge, DO  traMADol (ULTRAM) 50 MG tablet Take 1 tablet (50 mg total) by mouth every 6 (six) hours as needed. 06/03/19   Tomi Likens, Adam R, DO  Ubrogepant (UBRELVY) 100 MG TABS Take 1 tablet by mouth as needed (May repeat dose in 2 hours.  Maximum 2 tablets in 24 hours). 06/05/19   Pieter Partridge, DO    Allergies    Aspirin, Chlorhexidine, Ibuprofen, Iodine, Lunesta [eszopiclone], Nsaids, Shellfish allergy, Sulfa antibiotics, and Tape  Review of Systems   Review of Systems  Neurological: Positive for headaches.  All other systems reviewed and are negative.   Physical Exam Updated Vital Signs BP 119/84 (BP Location: Left Arm)   Pulse 94   Temp 98.6 F (37 C) (Oral)   Resp 18   Ht 5\' 6"  (1.676 m)   Wt 119.7 kg   SpO2 100%   BMI 42.61 kg/m   Physical Exam Vitals and nursing note reviewed.  Constitutional:      Appearance: She is well-developed.  HENT:     Head: Normocephalic and atraumatic.  Eyes:     Extraocular Movements: Extraocular movements intact.     Pupils: Pupils are equal, round, and reactive to light.  Cardiovascular:     Rate and Rhythm:  Normal rate and regular rhythm.  Pulmonary:     Effort: Pulmonary effort is normal. No respiratory distress.  Musculoskeletal:        General: No tenderness.     Cervical back: Neck supple.  Skin:    General: Skin is warm and  dry.  Neurological:     Mental Status: She is alert and oriented to person, place, and time.     Comments: No asymmetry of facial movements. Five out of five strength in all four extremities with sensation light touch intact in all four extremities  Psychiatric:        Behavior: Behavior normal.     ED Results / Procedures / Treatments   Labs (all labs ordered are listed, but only abnormal results are displayed) Labs Reviewed - No data to display  EKG None  Radiology No results found.  Procedures Procedures (including critical care time)  Medications Ordered in ED Medications - No data to display  ED Course  I have reviewed the triage vital signs and the nursing notes.  Pertinent labs & imaging results that were available during my care of the patient were reviewed by me and considered in my medical decision making (see chart for details).    MDM Rules/Calculators/A&P                           Patient with history of recurrent migraines here for evaluation of migraine despite taking her home medications. She is non-toxic appearing on evaluation with no focal neurologic deficits. Presentation is not consistent with subarachnoid hemorrhage, meningitis, dural sinus thrombosis. Patient does have multiple medication allergies, which limits treatment options. She was treated with Phenergan, Benadryl, fluids for her headache. She has traditionally in the past received narcotics for her headache. Discussed concern that these medications may contribute to rebound headache and worsening symptoms in the long term and feel like this is not a good treatment for her today. On repeat assessment after initial medications she had partial improvement in her headache with  resolution of her nausea. Following treatment with Depakote her headache continues to improve. Plan to discharge home with outpatient follow-up and return precautions.  Final Clinical Impression(s) / ED Diagnoses Final diagnoses:  Bad headache    Rx / DC Orders ED Discharge Orders    None       Quintella Reichert, MD 10/09/19 [phone]

## 2019-10-08 NOTE — ED Triage Notes (Signed)
Pt c/o " migraine" x 2 hrs

## 2019-10-09 MED ORDER — DEXAMETHASONE SODIUM PHOSPHATE 10 MG/ML IJ SOLN
10.0000 mg | Freq: Once | INTRAMUSCULAR | Status: AC
  Administered 2019-10-09: 10 mg via INTRAVENOUS
  Filled 2019-10-09: qty 1

## 2019-10-09 MED ORDER — VALPROATE SODIUM 500 MG/5ML IV SOLN
500.0000 mg | Freq: Once | INTRAVENOUS | Status: AC
  Administered 2019-10-09: 500 mg via INTRAVENOUS
  Filled 2019-10-09: qty 5

## 2019-10-09 MED ORDER — VALPROATE SODIUM 500 MG/5ML IV SOLN
INTRAVENOUS | Status: AC
  Filled 2019-10-09: qty 5

## 2019-10-09 NOTE — ED Notes (Signed)
ED Provider at bedside. 

## 2019-10-18 ENCOUNTER — Other Ambulatory Visit: Payer: Self-pay | Admitting: Neurology

## 2019-10-18 NOTE — Telephone Encounter (Signed)
Pt called and informed that script was called into the pharmacy

## 2019-10-18 NOTE — Telephone Encounter (Signed)
Patient called in and left message with the Access Nurse. Her Refill didn't get called in from her visit earlier this week. She would like a weeks worth called into [facility] and the rest to her mail order.

## 2019-10-18 NOTE — Telephone Encounter (Signed)
Patient called back in and would like 2 weeks sent to [facility] and then the rest to mail order.

## 2019-10-21 ENCOUNTER — Emergency Department (HOSPITAL_BASED_OUTPATIENT_CLINIC_OR_DEPARTMENT_OTHER)

## 2019-10-21 ENCOUNTER — Other Ambulatory Visit: Payer: Self-pay

## 2019-10-21 ENCOUNTER — Emergency Department (HOSPITAL_BASED_OUTPATIENT_CLINIC_OR_DEPARTMENT_OTHER)
Admission: EM | Admit: 2019-10-21 | Discharge: 2019-10-22 | Disposition: A | Discharge status: Home / Self Care | Attending: Emergency Medicine | Admitting: Emergency Medicine

## 2019-10-21 ENCOUNTER — Encounter (HOSPITAL_BASED_OUTPATIENT_CLINIC_OR_DEPARTMENT_OTHER): Payer: Self-pay | Admitting: Emergency Medicine

## 2019-10-21 DIAGNOSIS — J45909 Unspecified asthma, uncomplicated: Secondary | ICD-10-CM | POA: Diagnosis not present

## 2019-10-21 DIAGNOSIS — Z87891 Personal history of nicotine dependence: Secondary | ICD-10-CM | POA: Insufficient documentation

## 2019-10-21 DIAGNOSIS — G43809 Other migraine, not intractable, without status migrainosus: Secondary | ICD-10-CM | POA: Diagnosis not present

## 2019-10-21 DIAGNOSIS — R079 Chest pain, unspecified: Secondary | ICD-10-CM | POA: Insufficient documentation

## 2019-10-21 DIAGNOSIS — R519 Headache, unspecified: Secondary | ICD-10-CM | POA: Diagnosis present

## 2019-10-21 LAB — BASIC METABOLIC PANEL
Anion gap: 11 (ref 5–15)
BUN: 10 mg/dL (ref 6–20)
CO2: 25 mmol/L (ref 22–32)
Calcium: 9.2 mg/dL (ref 8.9–10.3)
Chloride: 104 mmol/L (ref 98–111)
Creatinine, Ser: 1.06 mg/dL — ABNORMAL HIGH (ref 0.44–1.00)
GFR calc Af Amer: >60 mL/min (ref >60)
GFR calc non Af Amer: >60 mL/min (ref >60)
Glucose, Bld: 134 mg/dL — ABNORMAL HIGH (ref 70–99)
Potassium: 3.7 mmol/L (ref 3.5–5.1)
Sodium: 140 mmol/L (ref 135–145)

## 2019-10-21 LAB — CBC
HCT: 40.9 % (ref 36.0–46.0)
Hemoglobin: 13.6 g/dL (ref 12.0–15.0)
MCH: 28.8 pg (ref 26.0–34.0)
MCHC: 33.3 g/dL (ref 30.0–36.0)
MCV: 86.7 fL (ref 80.0–100.0)
Platelets: 388 10*3/uL (ref 150–400)
RBC: 4.72 MIL/uL (ref 3.87–5.11)
RDW: 13 % (ref 11.5–15.5)
WBC: 12.3 10*3/uL — ABNORMAL HIGH (ref 4.0–10.5)
nRBC: 0 % (ref 0.0–0.2)

## 2019-10-21 LAB — TROPONIN I (HIGH SENSITIVITY)
Troponin I (High Sensitivity): <2 ng/L (ref <18)
Troponin I (High Sensitivity): <2 ng/L (ref <18)

## 2019-10-21 LAB — PREGNANCY, URINE: Preg Test, Ur: NEGATIVE

## 2019-10-21 NOTE — ED Triage Notes (Signed)
Midsternal chest pain since last night off and on. No sob or cough. Migraine and face hurts left side since yesterday.

## 2019-10-21 NOTE — ED Notes (Signed)
Attempted PIV once, able to get blood but would not thread so removed; pt tolerated well

## 2019-10-22 MED ORDER — HYDROMORPHONE HCL 1 MG/ML IJ SOLN
1.0000 mg | Freq: Once | INTRAMUSCULAR | Status: AC
  Administered 2019-10-22: 1 mg via INTRAVENOUS
  Filled 2019-10-22: qty 1

## 2019-10-22 MED ORDER — METOCLOPRAMIDE HCL 5 MG/ML IJ SOLN
10.0000 mg | Freq: Once | INTRAMUSCULAR | Status: DC
  Filled 2019-10-22: qty 2

## 2019-10-22 MED ORDER — PROMETHAZINE HCL 25 MG/ML IJ SOLN
INTRAMUSCULAR | Status: AC
  Filled 2019-10-22: qty 1

## 2019-10-22 MED ORDER — DIPHENHYDRAMINE HCL 50 MG/ML IJ SOLN
25.0000 mg | Freq: Once | INTRAMUSCULAR | Status: AC
  Administered 2019-10-22: 25 mg via INTRAVENOUS
  Filled 2019-10-22: qty 1

## 2019-10-22 MED ORDER — PROMETHAZINE HCL 25 MG/ML IJ SOLN
12.5000 mg | Freq: Once | INTRAMUSCULAR | Status: AC
  Administered 2019-10-22: 12.5 mg via INTRAVENOUS

## 2019-10-22 MED ORDER — SODIUM CHLORIDE 0.9 % IV BOLUS
1000.0000 mL | Freq: Once | INTRAVENOUS | Status: AC
  Administered 2019-10-22: 1000 mL via INTRAVENOUS

## 2019-10-22 MED ORDER — DEXAMETHASONE SODIUM PHOSPHATE 10 MG/ML IJ SOLN
10.0000 mg | Freq: Once | INTRAMUSCULAR | Status: AC
  Administered 2019-10-22: 10 mg via INTRAVENOUS
  Filled 2019-10-22: qty 1

## 2019-10-22 NOTE — Discharge Instructions (Addendum)
Continue medications as previously prescribed.  Follow-up with your primary doctor if symptoms not improving in the next few days.

## 2019-10-22 NOTE — ED Provider Notes (Signed)
[facility] EMERGENCY DEPARTMENT Provider Note   CSN: [id number] Arrival date & time: 10/21/19  1952     History Chief Complaint  Patient presents with  . Chest Pain  . Migraine    Amber [name] is a 39 y.o. female.  Patient is a 39 year old female with past medical history of migraines, pituitary adenoma, asthma.  She presents today for evaluation of headache.  Patient describes pressure in her head along with photophobia, nausea, and vomiting.  Patient states that when her migraines get to the point of her vomiting, she needs to come to the ER for a migraine cocktail.  This feels similar to her previous migraines.  She denies any fever.  She also reports a feeling of her heart "skipping a beat" occasionally over the past several days.  She has a primary care visit scheduled for this Wednesday to further evaluate this.  She just had an echocardiogram 2 weeks ago which was normal.  The history is provided by the patient.       Past Medical History:  Diagnosis Date  . Anxiety   . Asthma   . Depression   . GERD (gastroesophageal reflux disease)   . History of chicken pox   . History of migraine headaches   . Seizures [facility])     Patient Active Problem List   Diagnosis Date Noted  . Hyperglycemia 06/05/2019  . Insomnia 01/07/2019  . Bilateral hand pain 11/09/2018  . Agoraphobia 01/18/2018  . Anxiety with depression 03/24/2017  . Migraine 05/11/2016  . Pituitary adenoma ([place]) 03/05/2016  . Hyperprolactinemia ([place]) 03/02/2016    Past Surgical History:  Procedure Laterality Date  . APPENDECTOMY    . CHOLECYSTECTOMY    . Pituiary Tumor     Tumor Removal Pituitary     OB History   No obstetric history on file.     Family History  Problem Relation Age of Onset  . Diabetes Mother   . Migraines Mother   . Hyperlipidemia Mother   . Bipolar disorder Mother   . Irritable bowel syndrome Mother   . Diabetes Father   . Colon cancer Father   . Cancer  Father        colon  . Hyperlipidemia Father   . Hypertension Father   . Bipolar disorder Brother   . Migraines Sister   . Other Neg Hx        pituitary disorder  . Esophageal cancer Neg Hx     Social History   Tobacco Use  . Smoking status: Former Smoker    Packs/day: 1.00    Years: 15.00    Pack years: 15.00    Types: Cigarettes    Quit date: 01/24/2016    Years since quitting: 3.7  . Smokeless tobacco: Never Used  Vaping Use  . Vaping Use: Former  Substance Use Topics  . Alcohol use: No  . Drug use: No    Home Medications Prior to Admission medications   Medication Sig Start Date End Date Taking? Authorizing Provider  acetaminophen (TYLENOL) 500 MG tablet Take 500 mg by mouth as needed.    [provider]  cabergoline (DOSTINEX) 0.5 MG tablet Take 1 tablet (0.5 mg total) by mouth as directed. 1 mg twice a week and 0.5 mg once a week 08/29/19   Shamleffer, Melanie Crazier, MD  diphenhydrAMINE (BENADRYL) 50 MG tablet Take 50 mg by mouth at bedtime as needed for itching.    [provider]  escitalopram [place])  10 MG tablet Take 1 tablet (10 mg total) by mouth daily. 06/10/19   Shelda Pal, DO  famotidine (PEPCID) 20 MG tablet Take 20 mg by mouth 2 (two) times daily.    [provider]  Galcanezumab-gnlm (EMGALITY) 120 MG/ML SOAJ Inject 120 mg into the skin every 28 (twenty-eight) days. 06/03/19   Tomi Likens, Adam R, DO  hydrOXYzine (ATARAX/VISTARIL) 25 MG tablet TAKE 1-3 TABLETS (25-75 MG TOTAL) BY MOUTH 3 (THREE) TIMES DAILY AS NEEDED FOR ANXIETY. 01/22/18   Shelda Pal, DO  Hyoscyamine Sulfate SL (LEVSIN/SL) 0.125 MG SUBL Place 0.125 mg under the tongue every 6 (six) hours as needed. 02/05/19   Cirigliano, Vito V, DO  magnesium oxide (MAG-OX) 400 MG tablet Take 400 mg by mouth daily.    [provider]  metFORMIN (GLUCOPHAGE) 500 MG tablet Take 1 tablet (500 mg total) by mouth every morning. 06/24/19   Renato Shin, MD    ondansetron (ZOFRAN ODT) 8 MG disintegrating tablet Take 1 tablet (8 mg total) by mouth every 8 (eight) hours as needed for nausea or vomiting. 06/19/19   Tomi Likens, Adam R, DO  pantoprazole (PROTONIX) 40 MG tablet Take 1 tablet (40 mg total) by mouth 2 (two) times daily. Patient will need an appointment for further refills 06/04/19   Cirigliano, Vito V, DO  predniSONE (DELTASONE) 20 MG tablet Take 60 mg daily x 2 days then 40 mg daily x 2 days then 20 mg daily x 2 days 10/01/19   Drenda Freeze, MD  QUEtiapine (SEROQUEL) 100 MG tablet Take 1 tablet (100 mg total) by mouth at bedtime. 08/28/19   Shelda Pal, DO  Riboflavin 400 MG CAPS Take 400 mg by mouth daily.    [provider]  Rimegepant Sulfate (NURTEC) 75 MG TBDP Take 75 mg by mouth as needed (Daily as needed for a Migraine). Maximum 1 tablet in 24 hours.  Quantity 8. 09/17/19   Tomi Likens, Adam R, DO  topiramate (TOPAMAX) 100 MG tablet TAKE ONE TABLET BY MOUTH TWICE A DAY 10/18/19   Jaffe, Adam R, DO  traMADol (ULTRAM) 50 MG tablet Take 1 tablet (50 mg total) by mouth every 6 (six) hours as needed. 06/03/19   Tomi Likens, Adam R, DO  Ubrogepant (UBRELVY) 100 MG TABS Take 1 tablet by mouth as needed (May repeat dose in 2 hours.  Maximum 2 tablets in 24 hours). 06/05/19   Pieter Partridge, DO    Allergies    Aspirin, Chlorhexidine, Ibuprofen, Iodine, Lunesta [eszopiclone], Nsaids, Shellfish allergy, Sulfa antibiotics, and Tape  Review of Systems   Review of Systems  All other systems reviewed and are negative.   Physical Exam Updated Vital Signs BP 94/73 (BP Location: Right Arm)   Pulse 78   Temp 98.4 F (36.9 C) (Oral)   Resp 16   Wt 120 kg   LMP 10/11/2019 (Approximate)   SpO2 99%   BMI 42.70 kg/m   Physical Exam Vitals and nursing note reviewed.  Constitutional:      General: She is not in acute distress.    Appearance: She is well-developed. She is not diaphoretic.  HENT:     Head: Normocephalic and atraumatic.  Eyes:      Extraocular Movements: Extraocular movements intact.     Pupils: Pupils are equal, round, and reactive to light.  Cardiovascular:     Rate and Rhythm: Normal rate and regular rhythm.     Heart sounds: No murmur heard.  No friction rub.  No gallop.   Pulmonary:     Effort: Pulmonary effort is normal. No respiratory distress.     Breath sounds: Normal breath sounds. No wheezing.  Abdominal:     General: Bowel sounds are normal. There is no distension.     Palpations: Abdomen is soft.     Tenderness: There is no abdominal tenderness.  Musculoskeletal:        General: Normal range of motion.     Cervical back: Normal range of motion and neck supple.  Skin:    General: Skin is warm and dry.  Neurological:     General: No focal deficit present.     Mental Status: She is alert and oriented to person, place, and time.     Cranial Nerves: No cranial nerve deficit.     Motor: No weakness.     ED Results / Procedures / Treatments   Labs (all labs ordered are listed, but only abnormal results are displayed) Labs Reviewed  BASIC METABOLIC PANEL - Abnormal; Notable for the following components:      Result Value   Glucose, Bld 134 (*)    Creatinine, Ser 1.06 (*)    All other components within normal limits  CBC - Abnormal; Notable for the following components:   WBC 12.3 (*)    All other components within normal limits  PREGNANCY, URINE  TROPONIN I (HIGH SENSITIVITY)  TROPONIN I (HIGH SENSITIVITY)    EKG ED ECG REPORT   Date: 10/22/2019  Rate: 88  Rhythm: normal sinus rhythm  QRS Axis: normal  Intervals: normal  ST/T Wave abnormalities: normal  Conduction Disutrbances:none  Narrative Interpretation:   Old EKG Reviewed: none available  I have personally reviewed the EKG tracing and agree with the computerized printout as noted.   Radiology DG Chest 2 View  Result Date: 10/21/2019 CLINICAL DATA:  39 year old female with chest pain. EXAM: CHEST - 2 VIEW COMPARISON:   Chest radiograph dated 10/01/2019. FINDINGS: The heart size and mediastinal contours are within normal limits. Both lungs are clear. The visualized skeletal structures are unremarkable. IMPRESSION: No active cardiopulmonary disease. Electronically Signed   By: Anner Crete M.D.   On: 10/21/2019 20:54    Procedures Procedures (including critical care time)  Medications Ordered in ED Medications  sodium chloride 0.9 % bolus 1,000 mL (has no administration in time range)  metoCLOPramide (REGLAN) injection 10 mg (has no administration in time range)  dexamethasone (DECADRON) injection 10 mg (has no administration in time range)  diphenhydrAMINE (BENADRYL) injection 25 mg (has no administration in time range)    ED Course  I have reviewed the triage vital signs and the nursing notes.  Pertinent labs & imaging results that were available during my care of the patient were reviewed by me and considered in my medical decision making (see chart for details).    MDM Rules/Calculators/A&P  Patient presenting here with complaints of migraine headache and chest pain.  Her chest pain is atypical and seems noncardiac in etiology.  Her migraine was treated with migraine cocktail and IV fluids and is now feeling significantly improved.  Patient is neurologically intact with stable vital signs and I believe appropriate for discharge.  Final Clinical Impression(s) / ED Diagnoses Final diagnoses:  None    Rx / DC Orders ED Discharge Orders    None       Veryl Speak, MD 10/22/19 [phone]

## 2019-10-23 ENCOUNTER — Other Ambulatory Visit: Payer: Self-pay

## 2019-10-23 ENCOUNTER — Ambulatory Visit (INDEPENDENT_AMBULATORY_CARE_PROVIDER_SITE_OTHER): Admitting: Family Medicine

## 2019-10-23 ENCOUNTER — Encounter: Payer: Self-pay | Admitting: Family Medicine

## 2019-10-23 VITALS — BP 120/68 | HR 57 | Temp 98.3°F | Ht 66.0 in | Wt 264.1 lb

## 2019-10-23 DIAGNOSIS — R002 Palpitations: Secondary | ICD-10-CM | POA: Diagnosis not present

## 2019-10-23 LAB — TSH: TSH: 0.97 u[IU]/mL (ref 0.35–4.50)

## 2019-10-23 LAB — T4, FREE: Free T4: 0.67 ng/dL (ref 0.60–1.60)

## 2019-10-23 NOTE — Patient Instructions (Signed)
Stay hydrated. Try to drink around 60 oz of water  Mind the caffeine/alcohol intake.  Give [place] 2-3 business days to get the results of your labs back.   If you do not hear anything about your referral in the next 1-2 weeks, call our office and ask for an update.  Let us know if you need anything.

## 2019-10-23 NOTE — Progress Notes (Signed)
Chief Complaint  Patient presents with   Follow-up    ER Chest pain/and migraine    Amber [name] is a 39 y.o. female here for palpitations.  Length of issue: 4 d How long does it last: a few seconds Light headedness/passing out? No Chest pain/shortness of breath? Yes Hx of arrhythmia? No Medication changes/illicit substances? No No excessive caffeine or alcohol use.   Past Medical History:  Diagnosis Date   Anxiety    Asthma    Depression    GERD (gastroesophageal reflux disease)    History of chicken pox    History of migraine headaches    Seizures ([place])    Past Surgical History:  Procedure Laterality Date   APPENDECTOMY     CHOLECYSTECTOMY     Pituiary Tumor     Tumor Removal Pituitary   Allergies  Allergen Reactions   Aspirin    Chlorhexidine    Ibuprofen    Iodine    Lunesta [Eszopiclone] Other (See Comments)    GI issues   Nsaids Nausea And Vomiting   Shellfish Allergy     Tingle in mouth/throat   Sulfa Antibiotics    Tape Rash    Blisters    BP 120/68 (BP Location: Left Arm, Patient Position: Sitting, Cuff Size: Large)    Pulse (!) 57    Temp 98.3 F (36.8 C) (Oral)    Ht 5\' 6"  (1.676 m)    Wt 264 lb 2 oz (119.8 kg)    LMP 10/11/2019 (Approximate)    SpO2 95%    BMI 42.63 kg/m  Gen: Awake, alert, appearing stated age Eyes: PERRLA Mouth: MMM Heart: RRR, no bruits, no LE edema Lungs: CTAB, no accessory muscle use Neuro: No cerebellar signs MSK: No muscle group atrophy or asymmetry Psych: Age appropriate judgment and insight, mood/affect WNL  Palpitations - Plan: TSH, T4, free, Ambulatory referral to Cardiology  EKG neg in ER. Discussed getting one today and low yield, agreed to forego at this time. Likely needs longer term monitor, thus will refer to cards. Stay hydrated, mind caffeine/alcohol intake.  F/u as originally scheduled.  The patient voiced understanding and agreement to the plan.  Crosby Oyster Nira Visscher 2:31  PM 10/23/19

## 2019-10-29 ENCOUNTER — Telehealth (HOSPITAL_COMMUNITY): Payer: Self-pay | Admitting: *Deleted

## 2019-10-29 NOTE — Telephone Encounter (Signed)
Patient returning call regarding upcoming cardiac imaging study; pt verbalizes understanding of appt date/time, parking situation and where to check in, pre-test NPO status and verified iodine allergy which pt states is topical; name and call back number provided for further questions should they arise  [facility] [phone] office [phone] cell  Pt reports having contrasted IV CT scans without incident.

## 2019-10-29 NOTE — Telephone Encounter (Signed)
Attempted to call patient regarding upcoming cardiac CT appointment. Left message on voicemail with name and callback number  Donalee Gaumond Tai RN Navigator Cardiac Imaging [facility] [phone] Office [phone] Cell  

## 2019-10-31 ENCOUNTER — Encounter (HOSPITAL_BASED_OUTPATIENT_CLINIC_OR_DEPARTMENT_OTHER): Payer: Self-pay

## 2019-10-31 ENCOUNTER — Emergency Department (HOSPITAL_COMMUNITY)
Admission: RE | Admit: 2019-10-31 | Discharge: 2019-10-31 | Disposition: A | Discharge status: Home / Self Care | Source: Ambulatory Visit | Attending: Internal Medicine | Admitting: Internal Medicine

## 2019-10-31 ENCOUNTER — Encounter: Admitting: *Deleted

## 2019-10-31 ENCOUNTER — Other Ambulatory Visit: Payer: Self-pay

## 2019-10-31 DIAGNOSIS — Z006 Encounter for examination for normal comparison and control in clinical research program: Secondary | ICD-10-CM

## 2019-10-31 DIAGNOSIS — G4440 Drug-induced headache, not elsewhere classified, not intractable: Secondary | ICD-10-CM | POA: Diagnosis not present

## 2019-10-31 DIAGNOSIS — T463X5A Adverse effect of coronary vasodilators, initial encounter: Secondary | ICD-10-CM | POA: Diagnosis not present

## 2019-10-31 DIAGNOSIS — Z79899 Other long term (current) drug therapy: Secondary | ICD-10-CM | POA: Insufficient documentation

## 2019-10-31 DIAGNOSIS — D352 Benign neoplasm of pituitary gland: Secondary | ICD-10-CM | POA: Insufficient documentation

## 2019-10-31 DIAGNOSIS — Z87891 Personal history of nicotine dependence: Secondary | ICD-10-CM | POA: Insufficient documentation

## 2019-10-31 DIAGNOSIS — Z7984 Long term (current) use of oral hypoglycemic drugs: Secondary | ICD-10-CM | POA: Diagnosis not present

## 2019-10-31 DIAGNOSIS — R931 Abnormal findings on diagnostic imaging of heart and coronary circulation: Secondary | ICD-10-CM | POA: Insufficient documentation

## 2019-10-31 DIAGNOSIS — R9430 Abnormal result of cardiovascular function study, unspecified: Secondary | ICD-10-CM

## 2019-10-31 DIAGNOSIS — J45909 Unspecified asthma, uncomplicated: Secondary | ICD-10-CM | POA: Insufficient documentation

## 2019-10-31 DIAGNOSIS — T887XXA Unspecified adverse effect of drug or medicament, initial encounter: Secondary | ICD-10-CM | POA: Insufficient documentation

## 2019-10-31 DIAGNOSIS — G43909 Migraine, unspecified, not intractable, without status migrainosus: Secondary | ICD-10-CM | POA: Diagnosis present

## 2019-10-31 MED ORDER — NITROGLYCERIN 0.4 MG SL SUBL
0.8000 mg | SUBLINGUAL_TABLET | Freq: Once | SUBLINGUAL | Status: AC
  Administered 2019-10-31: 0.8 mg via SUBLINGUAL

## 2019-10-31 MED ORDER — NITROGLYCERIN 0.4 MG SL SUBL
SUBLINGUAL_TABLET | SUBLINGUAL | Status: AC
  Filled 2019-10-31: qty 2

## 2019-10-31 MED ORDER — IOHEXOL 350 MG/ML SOLN
80.0000 mL | Freq: Once | INTRAVENOUS | Status: AC | PRN
  Administered 2019-10-31: 80 mL via INTRAVENOUS

## 2019-10-31 NOTE — ED Triage Notes (Signed)
Pt c/o migraine since 8am-started after receiving NTG today during a "CT to check my coronary artery"-NAD-steady gait

## 2019-10-31 NOTE — Research (Signed)
Subject Name: Amber [name]  Subject met inclusion and exclusion criteria.  The informed consent form, study requirements and expectations were reviewed with the subject and questions and concerns were addressed prior to the signing of the consent form.  The subject verbalized understanding of the trial requirements.  The subject agreed to participate in the CADFEM Group 4 trial and signed the informed consent at 0704 on 10/31/19  The informed consent was obtained prior to performance of any protocol-specific procedures for the subject.  A copy of the signed informed consent was given to the subject and a copy was placed in the subject's medical record.   Timoteo Gaul

## 2019-11-01 ENCOUNTER — Emergency Department (HOSPITAL_BASED_OUTPATIENT_CLINIC_OR_DEPARTMENT_OTHER)
Admission: EM | Admit: 2019-11-01 | Discharge: 2019-11-01 | Disposition: A | Discharge status: Home / Self Care | Attending: Emergency Medicine | Admitting: Emergency Medicine

## 2019-11-01 DIAGNOSIS — G4440 Drug-induced headache, not elsewhere classified, not intractable: Secondary | ICD-10-CM

## 2019-11-01 DIAGNOSIS — T463X5A Adverse effect of coronary vasodilators, initial encounter: Secondary | ICD-10-CM

## 2019-11-01 MED ORDER — DEXAMETHASONE SODIUM PHOSPHATE 10 MG/ML IJ SOLN
10.0000 mg | Freq: Once | INTRAMUSCULAR | Status: AC
  Administered 2019-11-01: 10 mg via INTRAVENOUS
  Filled 2019-11-01: qty 1

## 2019-11-01 MED ORDER — HYDROMORPHONE HCL 1 MG/ML IJ SOLN
1.0000 mg | Freq: Once | INTRAMUSCULAR | Status: AC
  Administered 2019-11-01: 1 mg via INTRAVENOUS
  Filled 2019-11-01: qty 1

## 2019-11-01 MED ORDER — SODIUM CHLORIDE 0.9 % IV BOLUS
1000.0000 mL | Freq: Once | INTRAVENOUS | Status: AC
  Administered 2019-11-01: 1000 mL via INTRAVENOUS

## 2019-11-01 MED ORDER — DIPHENHYDRAMINE HCL 50 MG/ML IJ SOLN
25.0000 mg | Freq: Once | INTRAMUSCULAR | Status: AC
  Administered 2019-11-01: 25 mg via INTRAVENOUS
  Filled 2019-11-01: qty 1

## 2019-11-01 MED ORDER — PROMETHAZINE HCL 25 MG/ML IJ SOLN
25.0000 mg | Freq: Once | INTRAMUSCULAR | Status: AC
  Administered 2019-11-01: 25 mg via INTRAVENOUS
  Filled 2019-11-01: qty 1

## 2019-11-01 NOTE — ED Notes (Signed)
Pt states headache still 8/10. Requesting dilaudid as ordered.

## 2019-11-01 NOTE — ED Provider Notes (Signed)
[facility] DEPT [facility] Provider Note: Amber Spurling, MD, FACEP  CSN: [id number] MRN: [id number] ARRIVAL: 10/31/19 at 2159 ROOM: [place]  Migraine   HISTORY OF PRESENT ILLNESS  11/01/19 2:20 AM Amber [name] is a 39 y.o. female with a history of migraines.  She is here with a headache since 8 AM yesterday.  It was triggered by receiving nitroglycerin during CT coronary angiogram.  She rates the pain as an 8 out of 10, characterized as like previous migraines, and located on the left side of the face and head which is typical for her migraines.  She has had associated nausea and vomiting and mild photophobia.  She states she is susceptible to migraines soon before, and soon after receiving her Emgality shots.  Her Emgality shot is due this weekend (tomorrow is Saturday).  She has taken Nurtec without relief.  Past Medical History:  Diagnosis Date  . Anxiety   . Asthma   . Depression   . GERD (gastroesophageal reflux disease)   . History of chicken pox   . History of migraine headaches   . Seizures ([place])     Past Surgical History:  Procedure Laterality Date  . APPENDECTOMY    . CHOLECYSTECTOMY    . Pituiary Tumor     Tumor Removal Pituitary    Family History  Problem Relation Age of Onset  . Diabetes Mother   . Migraines Mother   . Hyperlipidemia Mother   . Bipolar disorder Mother   . Irritable bowel syndrome Mother   . Diabetes Father   . Colon cancer Father   . Cancer Father        colon  . Hyperlipidemia Father   . Hypertension Father   . Bipolar disorder Brother   . Migraines Sister   . Other Neg Hx        pituitary disorder  . Esophageal cancer Neg Hx     Social History   Tobacco Use  . Smoking status: Former Smoker    Packs/day: 1.00    Years: 15.00    Pack years: 15.00    Types: Cigarettes    Quit date: 01/24/2016    Years since quitting: 3.7  . Smokeless tobacco: Never Used  Vaping Use  . Vaping Use: Former  Substance  Use Topics  . Alcohol use: No  . Drug use: No    Prior to Admission medications   Medication Sig Start Date End Date Taking? Authorizing Provider  acetaminophen (TYLENOL) 500 MG tablet Take 500 mg by mouth as needed.    [provider]  cabergoline (DOSTINEX) 0.5 MG tablet Take 1 tablet (0.5 mg total) by mouth as directed. 1 mg twice a week and 0.5 mg once a week 08/29/19   Shamleffer, Melanie Crazier, MD  diphenhydrAMINE (BENADRYL) 50 MG tablet Take 50 mg by mouth at bedtime as needed for itching.    [provider]  escitalopram (LEXAPRO) 10 MG tablet Take 1 tablet (10 mg total) by mouth daily. 06/10/19   Shelda Pal, DO  famotidine (PEPCID) 20 MG tablet Take 20 mg by mouth 2 (two) times daily.    [provider]  Galcanezumab-gnlm (EMGALITY) 120 MG/ML SOAJ Inject 120 mg into the skin every 28 (twenty-eight) days. 06/03/19   Tomi Likens, Adam R, DO  hydrOXYzine (ATARAX/VISTARIL) 25 MG tablet TAKE 1-3 TABLETS (25-75 MG TOTAL) BY MOUTH 3 (THREE) TIMES DAILY AS NEEDED FOR ANXIETY. 01/22/18   Shelda Pal, DO  Hyoscyamine  Sulfate SL (LEVSIN/SL) 0.125 MG SUBL Place 0.125 mg under the tongue every 6 (six) hours as needed. 02/05/19   Cirigliano, Vito V, DO  magnesium oxide (MAG-OX) 400 MG tablet Take 400 mg by mouth daily.    [provider]  metFORMIN (GLUCOPHAGE) 500 MG tablet Take 1 tablet (500 mg total) by mouth every morning. 06/24/19   Renato Shin, MD  ondansetron (ZOFRAN ODT) 8 MG disintegrating tablet Take 1 tablet (8 mg total) by mouth every 8 (eight) hours as needed for nausea or vomiting. 06/19/19   Tomi Likens, Adam R, DO  pantoprazole (PROTONIX) 40 MG tablet Take 1 tablet (40 mg total) by mouth 2 (two) times daily. Patient will need an appointment for further refills 06/04/19   Cirigliano, Vito V, DO  predniSONE (DELTASONE) 20 MG tablet Take 60 mg daily x 2 days then 40 mg daily x 2 days then 20 mg daily x 2 days 10/01/19   Drenda Freeze, MD    QUEtiapine (SEROQUEL) 100 MG tablet Take 1 tablet (100 mg total) by mouth at bedtime. 08/28/19   Shelda Pal, DO  Riboflavin 400 MG CAPS Take 400 mg by mouth daily.    [provider]  Rimegepant Sulfate (NURTEC) 75 MG TBDP Take 75 mg by mouth as needed (Daily as needed for a Migraine). Maximum 1 tablet in 24 hours.  Quantity 8. 09/17/19   Tomi Likens, Adam R, DO  topiramate (TOPAMAX) 100 MG tablet TAKE ONE TABLET BY MOUTH TWICE A DAY 10/18/19   Jaffe, Adam R, DO  traMADol (ULTRAM) 50 MG tablet Take 1 tablet (50 mg total) by mouth every 6 (six) hours as needed. 06/03/19   Tomi Likens, Adam R, DO  Ubrogepant (UBRELVY) 100 MG TABS Take 1 tablet by mouth as needed (May repeat dose in 2 hours.  Maximum 2 tablets in 24 hours). 06/05/19   Pieter Partridge, DO    Allergies Aspirin, Chlorhexidine, Ibuprofen, Iodine, Lunesta [eszopiclone], Nsaids, Shellfish allergy, Sulfa antibiotics, and Tape   REVIEW OF SYSTEMS  Negative except as noted here or in the History of Present Illness.   PHYSICAL EXAMINATION  Initial Vital Signs Blood pressure 116/77, pulse 74, temperature 98 F (36.7 C), temperature source Oral, resp. rate 18, height 5\' 6"  (1.676 m), weight 120.7 kg, last menstrual period 10/11/2019, SpO2 100 %.  Examination General: Well-developed, well-nourished female in no acute distress; appearance consistent with age of record HENT: normocephalic; atraumatic Eyes: pupils equal, round and reactive to light; extraocular muscles intact Neck: supple Heart: regular rate and rhythm Lungs: clear to auscultation bilaterally Abdomen: soft; nondistended; nontender; bowel sounds present Extremities: No deformity; full range of motion; pulses normal Neurologic: Awake, alert and oriented; motor function intact in all extremities and symmetric; no facial droop Skin: Warm and dry Psychiatric: Normal mood and affect   RESULTS  Summary of this visit's results, reviewed and interpreted by myself:    EKG Interpretation  Date/Time:    Ventricular Rate:    PR Interval:    QRS Duration:   QT Interval:    QTC Calculation:   R Axis:     Text Interpretation:        Laboratory Studies: No results found for this or any previous visit (from the past 24 hour(s)). Imaging Studies: CT CORONARY MORPH W/CTA COR W/SCORE W/CA W/CM &/OR WO/CM  Addendum Date: 10/31/2019   ADDENDUM REPORT: 10/31/2019 09:52 HISTORY: Concern for retroaortic coronary artery on Echo, please evaluate per cardiology recommendations EXAM: Cardiac/Coronary  CT TECHNIQUE:  The patient was scanned on a [employer]. PROTOCOL: A 120 kV prospective scan was triggered in the descending thoracic aorta at 111 HU's. Axial non-contrast 3 mm slices were carried out through the heart. The data set was analyzed on a dedicated work station and scored using the [employer]. Gantry rotation speed was 250 msecs and collimation was .6 mm. Beta blockade and 0.8 mg of sl NTG was given. The 3D data set was reconstructed in 5% intervals of the 67-82 % of the R-R cycle. Diastolic phases were analyzed on a dedicated work station using MPR, MIP and VRT modes. The patient received 54mL OMNIPAQUE IOHEXOL 350 MG/ML SOLN of contrast. FINDINGS: Coronary calcium score is 0. Coronary arteries: Normal coronary origins and course. Right dominance. Right Coronary Artery: No detectable plaque or stenosis. Left Main Coronary Artery: No detectable plaque or stenosis. Left Anterior Descending Coronary Artery: No detectable plaque or stenosis. Left Circumflex Artery: No detectable plaque or stenosis. Aorta: Normal size, 31 mm at the mid ascending aorta (level of the PA bifurcation) measured double oblique. No calcifications. No dissection. Aortic Valve: No calcifications. Other findings: Normal pulmonary vein drainage into the left atrium. Normal left atrial appendage without a thrombus. Normal size of the pulmonary artery. Possible patent foramen ovale, no  definite interatrial shunt seen. IMPRESSION: 1.  Normal origin and course of the coronary arteries. 2.  No evidence of CAD, CADRADS = 0.  Coronary calcium score is 0. Electronically Signed   By: Cherlynn Kaiser   On: 10/31/2019 09:52   Result Date: 10/31/2019 EXAM: OVER-READ INTERPRETATION  CT CHEST The following report is an over-read performed by radiologist Dr. Abigail Miyamoto of [facility], [place] on 10/31/2019. This over-read does not include interpretation of cardiac or coronary anatomy or pathology. The coronary CTA interpretation by the cardiologist is attached. COMPARISON:  Chest radiograph 10/21/2019 FINDINGS: Vascular: Normal aortic caliber. No central pulmonary embolism, on this non-dedicated study. Mediastinum/Nodes: No imaged thoracic adenopathy. Lungs/Pleura: No pleural fluid.  Clear imaged lungs. Upper Abdomen: Normal imaged portions of the liver, spleen. Musculoskeletal: No acute osseous abnormality. IMPRESSION: No acute findings in the imaged extracardiac chest. Electronically Signed: By: Abigail Miyamoto M.D. On: 10/31/2019 08:57    ED COURSE and MDM  Nursing notes, initial and subsequent vitals signs, including pulse oximetry, reviewed and interpreted by myself.  Vitals:   10/31/19 2214 11/01/19 0220 11/01/19 0348  BP: 116/77 100/67 106/68  Pulse: 74 71 78  Resp: 18 16 20   Temp: 98 F (36.7 C)    TempSrc: Oral    SpO2: 100% 99% 100%  Weight: 120.7 kg    Height: 5\' 6"  (1.676 m)     Medications  sodium chloride 0.9 % bolus 1,000 mL ( Intravenous Stopped 11/01/19 0341)  promethazine (PHENERGAN) injection 25 mg (25 mg Intravenous Given 11/01/19 0242)  diphenhydrAMINE (BENADRYL) injection 25 mg (25 mg Intravenous Given 11/01/19 0242)  HYDROmorphone (DILAUDID) injection 1 mg (1 mg Intravenous Given 11/01/19 0305)  dexamethasone (DECADRON) injection 10 mg (10 mg Intravenous Given 11/01/19 0243)  HYDROmorphone (DILAUDID) injection 1 mg (1 mg Intravenous Given 11/01/19 0342)    4:06  AM Patient feeling better after IV fluids and medications.  She states she is ready to go home.  PROCEDURES  Procedures   ED DIAGNOSES     ICD-10-CM   1. Nitroglycerin-induced headache  G44.40    T46.3X5A        Brayln Duque, Jenny Reichmann, MD 11/01/19 [id number]

## 2019-11-29 ENCOUNTER — Emergency Department (HOSPITAL_BASED_OUTPATIENT_CLINIC_OR_DEPARTMENT_OTHER)

## 2019-11-29 ENCOUNTER — Other Ambulatory Visit: Payer: Self-pay

## 2019-11-29 ENCOUNTER — Encounter (HOSPITAL_BASED_OUTPATIENT_CLINIC_OR_DEPARTMENT_OTHER): Payer: Self-pay | Admitting: Emergency Medicine

## 2019-11-29 ENCOUNTER — Emergency Department (HOSPITAL_BASED_OUTPATIENT_CLINIC_OR_DEPARTMENT_OTHER)
Admission: EM | Admit: 2019-11-29 | Discharge: 2019-11-29 | Disposition: A | Discharge status: Home / Self Care | Attending: Emergency Medicine | Admitting: Emergency Medicine

## 2019-11-29 DIAGNOSIS — M549 Dorsalgia, unspecified: Secondary | ICD-10-CM | POA: Diagnosis present

## 2019-11-29 DIAGNOSIS — J45909 Unspecified asthma, uncomplicated: Secondary | ICD-10-CM | POA: Diagnosis not present

## 2019-11-29 DIAGNOSIS — Z79899 Other long term (current) drug therapy: Secondary | ICD-10-CM | POA: Insufficient documentation

## 2019-11-29 DIAGNOSIS — M546 Pain in thoracic spine: Secondary | ICD-10-CM

## 2019-11-29 DIAGNOSIS — G43909 Migraine, unspecified, not intractable, without status migrainosus: Secondary | ICD-10-CM | POA: Diagnosis not present

## 2019-11-29 DIAGNOSIS — Z87891 Personal history of nicotine dependence: Secondary | ICD-10-CM | POA: Insufficient documentation

## 2019-11-29 MED ORDER — PROMETHAZINE HCL 25 MG/ML IJ SOLN
12.5000 mg | Freq: Once | INTRAMUSCULAR | Status: AC
  Administered 2019-11-29: 12.5 mg via INTRAVENOUS
  Filled 2019-11-29: qty 1

## 2019-11-29 MED ORDER — HYDROMORPHONE HCL 1 MG/ML IJ SOLN
0.5000 mg | Freq: Once | INTRAMUSCULAR | Status: AC
  Administered 2019-11-29: 0.5 mg via INTRAVENOUS
  Filled 2019-11-29: qty 1

## 2019-11-29 MED ORDER — DEXAMETHASONE SODIUM PHOSPHATE 4 MG/ML IJ SOLN
4.0000 mg | Freq: Once | INTRAMUSCULAR | Status: AC
  Administered 2019-11-29: 4 mg via INTRAVENOUS
  Filled 2019-11-29: qty 1

## 2019-11-29 MED ORDER — DIPHENHYDRAMINE HCL 50 MG/ML IJ SOLN
50.0000 mg | Freq: Once | INTRAMUSCULAR | Status: AC
  Administered 2019-11-29: 50 mg via INTRAVENOUS
  Filled 2019-11-29: qty 1

## 2019-11-29 MED ORDER — SODIUM CHLORIDE 0.9 % IV BOLUS
1000.0000 mL | Freq: Once | INTRAVENOUS | Status: AC
  Administered 2019-11-29: 1000 mL via INTRAVENOUS

## 2019-11-29 NOTE — ED Notes (Signed)
PT states usual migraine headache. PT tearful, alert, skin warm and dry. No active n/v

## 2019-11-29 NOTE — Discharge Instructions (Addendum)
Please return to the ER with any new or worsening symptoms. It was a pleasure to meet you.  

## 2019-11-29 NOTE — ED Notes (Signed)
Patient transported to CT 

## 2019-11-29 NOTE — ED Notes (Signed)
ED Provider at bedside. 

## 2019-11-29 NOTE — ED Provider Notes (Signed)
[facility] EMERGENCY DEPARTMENT Provider Note   CSN: [id number] Arrival date & time: 11/29/19  1629     History Chief Complaint  Patient presents with  . Back Pain  . Headache    Amber [name] is a 39 y.o. female.  HPI Patient is a 39 year old female with a history as noted below.  Patient states that yesterday afternoon she was "hunched over working on the toilet".  Afterwards she began experiencing gradual onset midline thoracic back pain.  Pain worsens with movement and palpation.  She notes a history of migraines and felt that her back pain then triggered a migraine.  She states that her headache is constant/moderate/left-sided.  Consistent with prior migraines.  She reports some mild tingling in the left upper extremity which occurs with her migraines.  Mild intermittent nausea with vomiting.  No hematemesis.  Patient states she took a tizanidine last night without relief.  She receives Emgality shots every 28 days for her migraines.  Patient also notes that she takes [place] as needed for migraines.  She took a dose today without relief.  No fevers, chills, chest pain, shortness of breath, abdominal pain, leg swelling, urinary changes.     Past Medical History:  Diagnosis Date  . Anxiety   . Asthma   . Depression   . GERD (gastroesophageal reflux disease)   . History of chicken pox   . History of migraine headaches   . Seizures [facility])     Patient Active Problem List   Diagnosis Date Noted  . Hyperglycemia 06/05/2019  . Insomnia 01/07/2019  . Bilateral hand pain 11/09/2018  . Agoraphobia 01/18/2018  . Anxiety with depression 03/24/2017  . Migraine 05/11/2016  . 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 Hyperprolactinemia ([place]) 03/02/2016    Past Surgical History:  Procedure Laterality Date  . APPENDECTOMY    . CHOLECYSTECTOMY    . Pituiary Tumor     Tumor Removal Pituitary     OB History   No obstetric history on file.     Family History    Problem Relation Age of Onset  . Diabetes Mother   . Migraines Mother   . Hyperlipidemia Mother   . Bipolar disorder Mother   . Irritable bowel syndrome Mother   . Diabetes Father   . Colon cancer Father   . Cancer Father        colon  . Hyperlipidemia Father   . Hypertension Father   . Bipolar disorder Brother   . Migraines Sister   . Other Neg Hx        pituitary disorder  . Esophageal cancer Neg Hx     Social History   Tobacco Use  . Smoking status: Former Smoker    Packs/day: 1.00    Years: 15.00    Pack years: 15.00    Types: Cigarettes    Quit date: 01/24/2016    Years since quitting: 3.8  . Smokeless tobacco: Never Used  Vaping Use  . Vaping Use: Former  Substance Use Topics  . Alcohol use: No  . Drug use: No    Home Medications Prior to Admission medications   Medication Sig Start Date End Date Taking? Authorizing Provider  acetaminophen (TYLENOL) 500 MG tablet Take 500 mg by mouth as needed.    [provider]  cabergoline (DOSTINEX) 0.5 MG tablet Take 1 tablet (0.5 mg total) by mouth as directed. 1 mg twice a week and 0.5 mg once a week 08/29/19   Shamleffer,  Melanie Crazier, MD  diphenhydrAMINE (BENADRYL) 50 MG tablet Take 50 mg by mouth at bedtime as needed for itching.    [provider]  escitalopram (LEXAPRO) 10 MG tablet Take 1 tablet (10 mg total) by mouth daily. 06/10/19   Shelda Pal, DO  famotidine (PEPCID) 20 MG tablet Take 20 mg by mouth 2 (two) times daily.    [provider]  Galcanezumab-gnlm (EMGALITY) 120 MG/ML SOAJ Inject 120 mg into the skin every 28 (twenty-eight) days. 06/03/19   Tomi Likens, Adam R, DO  hydrOXYzine (ATARAX/VISTARIL) 25 MG tablet TAKE 1-3 TABLETS (25-75 MG TOTAL) BY MOUTH 3 (THREE) TIMES DAILY AS NEEDED FOR ANXIETY. 01/22/18   Shelda Pal, DO  Hyoscyamine Sulfate SL (LEVSIN/SL) 0.125 MG SUBL Place 0.125 mg under the tongue every 6 (six) hours as needed. 02/05/19   Cirigliano, Vito  V, DO  magnesium oxide (MAG-OX) 400 MG tablet Take 400 mg by mouth daily.    [provider]  metFORMIN (GLUCOPHAGE) 500 MG tablet Take 1 tablet (500 mg total) by mouth every morning. 06/24/19   Renato Shin, MD  ondansetron (ZOFRAN ODT) 8 MG disintegrating tablet Take 1 tablet (8 mg total) by mouth every 8 (eight) hours as needed for nausea or vomiting. 06/19/19   Tomi Likens, Adam R, DO  pantoprazole (PROTONIX) 40 MG tablet Take 1 tablet (40 mg total) by mouth 2 (two) times daily. Patient will need an appointment for further refills 06/04/19   Cirigliano, Vito V, DO  predniSONE (DELTASONE) 20 MG tablet Take 60 mg daily x 2 days then 40 mg daily x 2 days then 20 mg daily x 2 days 10/01/19   Drenda Freeze, MD  QUEtiapine (SEROQUEL) 100 MG tablet Take 1 tablet (100 mg total) by mouth at bedtime. 08/28/19   Shelda Pal, DO  Riboflavin 400 MG CAPS Take 400 mg by mouth daily.    [provider]  Rimegepant Sulfate (NURTEC) 75 MG TBDP Take 75 mg by mouth as needed (Daily as needed for a Migraine). Maximum 1 tablet in 24 hours.  Quantity 8. 09/17/19   Tomi Likens, Adam R, DO  topiramate (TOPAMAX) 100 MG tablet TAKE ONE TABLET BY MOUTH TWICE A DAY 10/18/19   Jaffe, Adam R, DO  traMADol (ULTRAM) 50 MG tablet Take 1 tablet (50 mg total) by mouth every 6 (six) hours as needed. 06/03/19   Tomi Likens, Adam R, DO  Ubrogepant (UBRELVY) 100 MG TABS Take 1 tablet by mouth as needed (May repeat dose in 2 hours.  Maximum 2 tablets in 24 hours). 06/05/19   Pieter Partridge, DO    Allergies    Aspirin, Chlorhexidine, Ibuprofen, Iodine, Lunesta [eszopiclone], Nsaids, Shellfish allergy, Sulfa antibiotics, and Tape  Review of Systems   Review of Systems  All other systems reviewed and are negative. Ten systems reviewed and are negative for acute change, except as noted in the HPI.    Physical Exam Updated Vital Signs BP 105/61 (BP Location: Right Arm)   Pulse 73   Temp (!) 97.5 F (36.4 C) (Oral)   Resp 20    SpO2 99%   Physical Exam Vitals and nursing note reviewed.  Constitutional:      General: She is not in acute distress.    Appearance: Normal appearance. She is not ill-appearing, toxic-appearing or diaphoretic.  HENT:     Head: Normocephalic and atraumatic.     Right Ear: External ear normal.     Left Ear: External ear normal.  Nose: Nose normal.     Mouth/Throat:     Mouth: Mucous membranes are moist.     Pharynx: Oropharynx is clear. No oropharyngeal exudate or posterior oropharyngeal erythema.  Eyes:     General: No scleral icterus.    Extraocular Movements: Extraocular movements intact.     Right eye: Normal extraocular motion and no nystagmus.     Left eye: Normal extraocular motion and no nystagmus.     Pupils: Pupils are equal, round, and reactive to light. Pupils are equal.     Right eye: Pupil is round and reactive.     Left eye: Pupil is round and reactive.  Cardiovascular:     Rate and Rhythm: Normal rate and regular rhythm.     Pulses: Normal pulses.     Heart sounds: Normal heart sounds. No murmur heard.  No friction rub. No gallop.      Comments: Regular rate and rhythm.  No murmurs, rubs, gallops. Pulmonary:     Effort: Pulmonary effort is normal. No respiratory distress.     Breath sounds: Normal breath sounds. No stridor. No wheezing, rhonchi or rales.     Comments: Lungs are clear to auscultation bilaterally. Abdominal:     General: Abdomen is flat.     Palpations: Abdomen is soft.     Tenderness: There is no abdominal tenderness.  Musculoskeletal:        General: Normal range of motion.     Cervical back: Normal range of motion and neck supple. No rigidity or tenderness.     Comments: Mild TTP noted along the midline thoracic spine diffusely.  No paraspinal musculature tenderness.  No midline cervical spine tenderness.  Full range of motion of the cervical spine.  Skin:    General: Skin is warm and dry.  Neurological:     General: No focal deficit  present.     Mental Status: She is alert and oriented to person, place, and time.     GCS: GCS eye subscore is 4. GCS verbal subscore is 5. GCS motor subscore is 6.     Cranial Nerves: No cranial nerve deficit, dysarthria or facial asymmetry.     Sensory: No sensory deficit.     Motor: No weakness.     Comments: Distal sensation intact in all 4 extremities.  Strength is 5 out of 5 in the bilateral upper extremities.  Grip strength is 5 out of 5 bilaterally.  Patient is oriented to person, place, time.  She speaks clearly and coherently.  Psychiatric:        Mood and Affect: Mood normal.        Speech: Speech normal.        Behavior: Behavior normal.     ED Results / Procedures / Treatments   Labs (all labs ordered are listed, but only abnormal results are displayed) Labs Reviewed - No data to display  EKG None  Radiology DG Thoracic Spine 2 View  Result Date: 11/29/2019 CLINICAL DATA:  Upper back pain. EXAM: THORACIC SPINE 2 VIEWS COMPARISON:  None. FINDINGS: There is no evidence of thoracic spine fracture. Alignment is normal. No other significant bone abnormalities are identified. Radiopaque surgical clips are seen overlying the right upper quadrant. IMPRESSION: Negative. Electronically Signed   By: Virgina Norfolk M.D.   On: 11/29/2019 21:48    Procedures Procedures (including critical care time)  Medications Ordered in ED Medications  diphenhydrAMINE (BENADRYL) injection 50 mg (50 mg Intravenous Given 11/29/19 2127)  sodium  chloride 0.9 % bolus 1,000 mL (1,000 mLs Intravenous New Bag/Given 11/29/19 2125)  promethazine (PHENERGAN) injection 12.5 mg (12.5 mg Intravenous Given 11/29/19 2128)  HYDROmorphone (DILAUDID) injection 0.5 mg (0.5 mg Intravenous Given 11/29/19 2126)  dexamethasone (DECADRON) injection 4 mg (4 mg Intravenous Given 11/29/19 2254)  HYDROmorphone (DILAUDID) injection 0.5 mg (0.5 mg Intravenous Given 11/29/19 2254)   ED Course  I have reviewed the triage  vital signs and the nursing notes.  Pertinent labs & imaging results that were available during my care of the patient were reviewed by me and considered in my medical decision making (see chart for details).  Clinical Course as of Nov 28 2253  Fri Nov 29, 2019  2200 Negative  DG Thoracic Spine 2 View [LJ]    Clinical Course User Index [LJ] Rayna Sexton, PA-C   MDM Rules/Calculators/A&P                          Pt is a 39 y.o. female that presents with a history, physical exam, and ED Clinical Course as noted above.   Patient presents today with upper back pain as well as symptoms consistent with prior migraine headaches.  Patient has been evaluated in the emergency department many times for her migraines.  She feels that her symptoms today are consistent with prior migraines.  Patient given migraine cocktail as well as IV fluids.  Patient given Decadron to help prevent rebound headache.  Also obtained images of the thoracic spine given her new onset midline thoracic pain. These were negative. Pt notes moderate relief of her HA sx. She feels comfortable with d/c at this time.   Patient has both muscle relaxers as well as tramadol at home for chronic pain.  Recommended that she adhere to her current pain management regimen.  Patient is hemodynamically stable and in NAD at the time of d/c. Evaluation does not show pathology that would require ongoing emergent intervention or inpatient treatment. I explained the diagnosis to the patient. Patient is comfortable with above plan and is stable for discharge at this time. All questions were answered prior to disposition. Strict return precautions for returning to the ED were discussed. Encouraged follow up with PCP.    An After Visit Summary was printed and given to the patient.  Patient discharged to home/self care.  Condition at discharge: Stable  Note: Portions of this report may have been transcribed using voice recognition software.  Every effort was made to ensure accuracy; however, inadvertent computerized transcription errors may be present.   Final Clinical Impression(s) / ED Diagnoses Final diagnoses:  Migraine without status migrainosus, not intractable, unspecified migraine type  Acute midline thoracic back pain   Rx / DC Orders ED Discharge Orders    None       Rayna Sexton, PA-C 11/29/19 2259    Blanchie Dessert, MD 12/01/19 2246

## 2019-11-29 NOTE — ED Triage Notes (Signed)
Headache back pain 2 days.

## 2019-11-30 DIAGNOSIS — F418 Other specified anxiety disorders: Secondary | ICD-10-CM

## 2019-12-02 MED ORDER — ESCITALOPRAM OXALATE 10 MG PO TABS: 10.0000 mg | ORAL_TABLET | Freq: Every day | ORAL | 1 refills | Status: DC

## 2019-12-03 ENCOUNTER — Other Ambulatory Visit: Payer: Self-pay

## 2019-12-03 ENCOUNTER — Ambulatory Visit (INDEPENDENT_AMBULATORY_CARE_PROVIDER_SITE_OTHER): Admitting: Medical

## 2019-12-03 ENCOUNTER — Ambulatory Visit (HOSPITAL_BASED_OUTPATIENT_CLINIC_OR_DEPARTMENT_OTHER)
Admission: RE | Admit: 2019-12-03 | Discharge: 2019-12-03 | Disposition: A | Discharge status: Home / Self Care | Source: Ambulatory Visit | Attending: Medical | Admitting: Medical

## 2019-12-03 VITALS — BP 100/63 | HR 72 | Temp 98.6°F | Resp 18 | Ht 66.0 in | Wt 264.0 lb

## 2019-12-03 DIAGNOSIS — M542 Cervicalgia: Secondary | ICD-10-CM | POA: Diagnosis present

## 2019-12-03 MED ORDER — CYCLOBENZAPRINE HCL 5 MG PO TABS: ORAL_TABLET | ORAL | 0 refills | Status: DC

## 2019-12-03 MED ORDER — METHYLPREDNISOLONE 4 MG PO TABS: ORAL_TABLET | ORAL | 0 refills | Status: DC

## 2019-12-03 NOTE — Patient Instructions (Signed)
You do have recent upper T-spine and C-spine region pain.  T-spine x-ray was negative and I do want to now get cervical spine x-ray to evaluate level C-spine area.  Suspect pain is muscular in origin.  I want you to stop your tinazadine and will prescribe Flexeril.  Dose of Flexeril 5 to 10 mg nightly.  Rx advisement given.  You report history of allergy to NSAIDs so we will prescribe Medrol 6-day taper dose.  Rx advisement given.  Check sugars daily and if any sugars over 200 let me know.  Last A1c in chart was 5.4.   We will follow cervical spine x-ray results and update you on those.  Follow-up in 5 to 7 days.  If pain not progressively improving then consider sports  medicine referral.

## 2019-12-03 NOTE — Progress Notes (Signed)
  Subjective:     Patient ID: Amber [name], female   DOB: 1980/12/17, 39 y.o.   MRN: [id number]  HPI  Pt in with some upper back tspine and neck area pain on friday. Pt states this occurred after she fixed a a toilet. She states she was bent over taking out components of toilet bowel.  Pt states above pain lead to migraine. She went to the ED on 11-29-2019.  Pt has neurologist. She is on emgality.  In ED got fluids, decadron, benadryl, dilaudid and phenergan. Pt ha did stop.   But upper back/neck pain still persists  Pt thoracic spine xray in ED was negative.  Some pain when she moves her head. Point to upper tspine and lower cervical spine area.  No hx of diabetes.  Pt has tinazadine at home.  lmp- November 19, 2019.   Review of Systems  Constitutional: Negative for chills and fatigue.  Respiratory: Negative for cough, chest tightness, shortness of breath and wheezing.   Cardiovascular: Negative for chest pain and palpitations.  Gastrointestinal: Negative for abdominal pain, nausea and vomiting.  Musculoskeletal: Positive for back pain and neck pain.  Skin: Negative for rash.  Neurological: Negative for dizziness, weakness, numbness and headaches.       None present but hx of migraine.  Hematological: Negative for adenopathy. Does not bruise/bleed easily.  Psychiatric/Behavioral: Negative for behavioral problems.       Objective:   Physical Exam   General Mental Status- Alert. General Appearance- Not in acute distress.   Skin General: Color- Normal Color. Moisture- Normal Moisture.  Neck Lower mid c spine region tenderness. No nuccal rigidity.  Back- upper t spine/c spine junction region pain  Chest and Lung Exam Auscultation: Breath Sounds:-Normal.  Cardiovascular Auscultation:Rythm- Regular. Murmurs & Other Heart Sounds:Auscultation of the heart reveals- No Murmurs.  Abdomen Inspection:-Inspeection Normal. Palpation/Percussion:Note:No mass.  Palpation and Percussion of the abdomen reveal- Non Tender, Non Distended + BS, no rebound or guarding.    Neurologic Cranial Nerve exam:- CN III-XII intact(No nystagmus), symmetric smile. Strength:- 5/5 equal and symmetric strength both upper and lower extremities.    Assessment:   Plan:   You do have recent upper T-spine and C-spine region pain.  T-spine x-ray was negative and I do want to now get cervical spine x-ray to evaluate level C-spine area.  Suspect pain is muscular in origin.  I want you to stop your tinazadine and will prescribe Flexeril.  Dose of Flexeril 5 to 10 mg nightly.  Rx advisement given.  You report history of allergy to NSAIDs so we will prescribe Medrol 6-day taper dose.  Rx advisement given.  Check sugars daily and if any sugars over 200 let me know.  Last A1c in chart was 5.4.   We will follow cervical spine x-ray results and update you on those.  Follow-up in 5 to 7 days.  If pain not progressively improving then consider sports  medicine referral.  Mackie Pai, PA-C

## 2019-12-17 ENCOUNTER — Other Ambulatory Visit: Payer: Self-pay

## 2019-12-17 ENCOUNTER — Encounter (HOSPITAL_BASED_OUTPATIENT_CLINIC_OR_DEPARTMENT_OTHER): Payer: Self-pay | Admitting: *Deleted

## 2019-12-17 DIAGNOSIS — R739 Hyperglycemia, unspecified: Secondary | ICD-10-CM | POA: Insufficient documentation

## 2019-12-17 DIAGNOSIS — J45909 Unspecified asthma, uncomplicated: Secondary | ICD-10-CM | POA: Diagnosis not present

## 2019-12-17 DIAGNOSIS — R519 Headache, unspecified: Secondary | ICD-10-CM | POA: Diagnosis present

## 2019-12-17 DIAGNOSIS — Z7984 Long term (current) use of oral hypoglycemic drugs: Secondary | ICD-10-CM | POA: Diagnosis not present

## 2019-12-17 DIAGNOSIS — H538 Other visual disturbances: Secondary | ICD-10-CM | POA: Diagnosis not present

## 2019-12-17 NOTE — ED Triage Notes (Signed)
Pt reports mha since 1pm- hx of same- Took zofran and nurtec sl without relief- reports she has vomited x 2

## 2019-12-18 ENCOUNTER — Emergency Department (HOSPITAL_BASED_OUTPATIENT_CLINIC_OR_DEPARTMENT_OTHER)
Admission: EM | Admit: 2019-12-18 | Discharge: 2019-12-18 | Disposition: A | Discharge status: Home / Self Care | Attending: Emergency Medicine | Admitting: Emergency Medicine

## 2019-12-18 DIAGNOSIS — G4489 Other headache syndrome: Secondary | ICD-10-CM

## 2019-12-18 MED ORDER — PROMETHAZINE HCL 25 MG/ML IJ SOLN
INTRAMUSCULAR | Status: AC
  Filled 2019-12-18: qty 1

## 2019-12-18 MED ORDER — DIPHENHYDRAMINE HCL 50 MG/ML IJ SOLN
25.0000 mg | Freq: Once | INTRAMUSCULAR | Status: AC
  Administered 2019-12-18: 25 mg via INTRAVENOUS
  Filled 2019-12-18: qty 1

## 2019-12-18 MED ORDER — DEXAMETHASONE SODIUM PHOSPHATE 10 MG/ML IJ SOLN
10.0000 mg | Freq: Once | INTRAMUSCULAR | Status: AC
  Administered 2019-12-18: 10 mg via INTRAVENOUS
  Filled 2019-12-18: qty 1

## 2019-12-18 MED ORDER — HYDROMORPHONE HCL 1 MG/ML IJ SOLN
1.0000 mg | Freq: Once | INTRAMUSCULAR | Status: AC
  Administered 2019-12-18: 1 mg via INTRAVENOUS
  Filled 2019-12-18: qty 1

## 2019-12-18 MED ORDER — PROMETHAZINE HCL 25 MG/ML IJ SOLN
25.0000 mg | Freq: Once | INTRAMUSCULAR | Status: AC
  Administered 2019-12-18: 25 mg via INTRAVENOUS

## 2019-12-18 NOTE — ED Provider Notes (Signed)
[facility] EMERGENCY DEPARTMENT Provider Note   CSN: [id number] Arrival date & time: 12/17/19  2110     History Chief Complaint  Patient presents with  . Migraine    Amber [name] is a 39 y.o. female.  The history is provided by the patient.  Migraine This is a recurrent problem. The current episode started 6 to 12 hours ago. The problem occurs constantly. The problem has not changed since onset.Associated symptoms include headaches. Pertinent negatives include no chest pain, no abdominal pain and no shortness of breath. Nothing aggravates the symptoms. Nothing relieves the symptoms. Treatments tried: Home medications. The treatment provided no relief.  Patient with history of anxiety, depression, migraines presents for headache.  She reports symptoms are typical for her migraine.  It is left-sided with associated blurred vision.  She also reports weakness that is typical for her for her migraine.  No fevers.  No chest pain shortness of breath.  No seizures.  She took all medications without relief.  She reports she gets about 3-4 migraines per week.  She reports for severe migraine she typically goes the ER and requires IV Dilaudid/Benadryl/Phenergan/Decadron.  She reports due to multiple drug allergies and interaction she is unable to receive any other medications     Past Medical History:  Diagnosis Date  . Anxiety   . Asthma   . Depression   . GERD (gastroesophageal reflux disease)   . History of chicken pox   . History of migraine headaches   . Seizures [facility])     Patient Active Problem List   Diagnosis Date Noted  . Hyperglycemia 06/05/2019  . Insomnia 01/07/2019  . Bilateral hand pain 11/09/2018  . Agoraphobia 01/18/2018  . Anxiety with depression 03/24/2017  . Migraine 05/11/2016  . Pituitary adenoma ([place]) 03/05/2016  . Hyperprolactinemia ([place]) 03/02/2016    Past Surgical History:  Procedure Laterality Date  . APPENDECTOMY    . CHOLECYSTECTOMY     . Pituiary Tumor     Tumor Removal Pituitary     OB History   No obstetric history on file.     Family History  Problem Relation Age of Onset  . Diabetes Mother   . Migraines Mother   . Hyperlipidemia Mother   . Bipolar disorder Mother   . Irritable bowel syndrome Mother   . Diabetes Father   . Colon cancer Father   . Cancer Father        colon  . Hyperlipidemia Father   . Hypertension Father   . Bipolar disorder Brother   . Migraines Sister   . Other Neg Hx        pituitary disorder  . Esophageal cancer Neg Hx     Social History   Tobacco Use  . Smoking status: Former Smoker    Packs/day: 1.00    Years: 15.00    Pack years: 15.00    Types: Cigarettes    Quit date: 01/24/2016    Years since quitting: 3.9  . Smokeless tobacco: Never Used  Vaping Use  . Vaping Use: Former  Substance Use Topics  . Alcohol use: No  . Drug use: No    Home Medications Prior to Admission medications   Medication Sig Start Date End Date Taking? Authorizing Provider  acetaminophen (TYLENOL) 500 MG tablet Take 500 mg by mouth as needed.    [provider]  cabergoline (DOSTINEX) 0.5 MG tablet Take 1 tablet (0.5 mg total) by mouth as directed. 1 mg twice a  week and 0.5 mg once a week 08/29/19   Shamleffer, Melanie Crazier, MD  cyclobenzaprine (FLEXERIL) 5 MG tablet 1-2 tab po q hs 12/03/19   Saguier, Percell Miller, PA-C  diphenhydrAMINE (BENADRYL) 50 MG tablet Take 50 mg by mouth at bedtime as needed for itching.    [provider]  escitalopram (LEXAPRO) 10 MG tablet Take 1 tablet (10 mg total) by mouth daily. 12/02/19   Shelda Pal, DO  famotidine (PEPCID) 20 MG tablet Take 20 mg by mouth 2 (two) times daily.    [provider]  Galcanezumab-gnlm (EMGALITY) 120 MG/ML SOAJ Inject 120 mg into the skin every 28 (twenty-eight) days. 06/03/19   Tomi Likens, Adam R, DO  hydrOXYzine (ATARAX/VISTARIL) 25 MG tablet TAKE 1-3 TABLETS (25-75 MG TOTAL) BY MOUTH 3 (THREE)  TIMES DAILY AS NEEDED FOR ANXIETY. 01/22/18   Shelda Pal, DO  Hyoscyamine Sulfate SL (LEVSIN/SL) 0.125 MG SUBL Place 0.125 mg under the tongue every 6 (six) hours as needed. 02/05/19   Cirigliano, Vito V, DO  magnesium oxide (MAG-OX) 400 MG tablet Take 400 mg by mouth daily.    [provider]  metFORMIN (GLUCOPHAGE) 500 MG tablet Take 1 tablet (500 mg total) by mouth every morning. 06/24/19   Renato Shin, MD  methylPREDNISolone (MEDROL) 4 MG tablet Standard 6 day taper dose. 12/03/19   Saguier, Percell Miller, PA-C  ondansetron (ZOFRAN ODT) 8 MG disintegrating tablet Take 1 tablet (8 mg total) by mouth every 8 (eight) hours as needed for nausea or vomiting. 06/19/19   Tomi Likens, Adam R, DO  pantoprazole (PROTONIX) 40 MG tablet Take 1 tablet (40 mg total) by mouth 2 (two) times daily. Patient will need an appointment for further refills 06/04/19   Cirigliano, Vito V, DO  predniSONE (DELTASONE) 20 MG tablet Take 60 mg daily x 2 days then 40 mg daily x 2 days then 20 mg daily x 2 days 10/01/19   Drenda Freeze, MD  QUEtiapine (SEROQUEL) 100 MG tablet Take 1 tablet (100 mg total) by mouth at bedtime. 08/28/19   Shelda Pal, DO  Riboflavin 400 MG CAPS Take 400 mg by mouth daily.    [provider]  Rimegepant Sulfate (NURTEC) 75 MG TBDP Take 75 mg by mouth as needed (Daily as needed for a Migraine). Maximum 1 tablet in 24 hours.  Quantity 8. 09/17/19   Tomi Likens, Adam R, DO  topiramate (TOPAMAX) 100 MG tablet TAKE ONE TABLET BY MOUTH TWICE A DAY 10/18/19   Jaffe, Adam R, DO  traMADol (ULTRAM) 50 MG tablet Take 1 tablet (50 mg total) by mouth every 6 (six) hours as needed. 06/03/19   Tomi Likens, Adam R, DO  Ubrogepant (UBRELVY) 100 MG TABS Take 1 tablet by mouth as needed (May repeat dose in 2 hours.  Maximum 2 tablets in 24 hours). 06/05/19   Pieter Partridge, DO    Allergies    Aspirin, Chlorhexidine, Ibuprofen, Iodine, Lunesta [eszopiclone], Nsaids, Shellfish allergy, Sulfa antibiotics,  and Tape  Review of Systems   Review of Systems  Constitutional: Negative for fever.  Eyes: Positive for visual disturbance.       Reports blurred vision  Respiratory: Negative for shortness of breath.   Cardiovascular: Negative for chest pain.  Gastrointestinal: Positive for nausea and vomiting. Negative for abdominal pain.  Neurological: Positive for weakness and headaches. Negative for seizures, facial asymmetry and speech difficulty.  All other systems reviewed and are negative.   Physical Exam Updated Vital Signs BP (!) 96/53 (  BP Location: Right Arm)   Pulse 78   Temp 97.9 F (36.6 C) (Oral)   Resp 19   Ht 1.676 m (5\' 6" )   Wt 117.9 kg   LMP 12/17/2019   SpO2 100%   BMI 41.97 kg/m   Physical Exam CONSTITUTIONAL: Well developed/well nourished HEAD: Normocephalic/atraumatic EYES: EOMI/PERRL, no nystagmus, no ptosis, normal fundoscopic exam (no papilledema)  ENMT: Mucous membranes moist NECK: supple no meningeal signs, no bruits SPINE/BACK:entire spine nontender CV: S1/S2 noted, no murmurs/rubs/gallops noted LUNGS: Lungs are clear to auscultation bilaterally, no apparent distress ABDOMEN: soft, nontender, no rebound or guarding GU:no cva tenderness NEURO:Awake/alert, face symmetric, no arm or leg drift is noted Equal 5/5 strength with shoulder abduction, elbow flex/extension, wrist flex/extension in upper extremities and equal hand grips bilaterally Equal 5/5 strength with hip flexion,knee flex/extension, foot dorsi/plantar flexion Cranial nerves 3/4/5/6/09/26/08/11/12 tested and intact Gait normal without ataxia No past pointing Sensation to light touch intact in all extremities EXTREMITIES: pulses normal, full ROM SKIN: warm, color normal PSYCH: no abnormalities of mood noted, alert and oriented to situation   ED Results / Procedures / Treatments   Labs (all labs ordered are listed, but only abnormal results are displayed) Labs Reviewed - No data to  display  EKG None  Radiology No results found.  Procedures Procedures    Medications Ordered in ED Medications  promethazine (PHENERGAN) injection 25 mg (25 mg Intravenous Given 12/18/19 0043)  diphenhydrAMINE (BENADRYL) injection 25 mg (25 mg Intravenous Given 12/18/19 0047)  dexamethasone (DECADRON) injection 10 mg (10 mg Intravenous Given 12/18/19 0048)  HYDROmorphone (DILAUDID) injection 1 mg (1 mg Intravenous Given 12/18/19 0046)    ED Course  I have reviewed the triage vital signs and the nursing notes.     MDM Rules/Calculators/A&P                          Patient presents for which she reports is a typical migraine with left-sided headache.  It did not respond to her usual home medications, therefore she came to the ER for IV pain medications.  She reports she typically receives IV Dilaudid/Benadryl/Phenergan/Decadron.  Advised her that using narcotics for migraine headaches as not generally recommended and cause rebound headache, but she reports due to drug interactions and allergies she is unable to receive any other medications.  Patient will be referred back to her neurologist  Patient has no focal neuro deficits in no acute distress, no current suspicion for other acute neurologic emergency Final Clinical Impression(s) / ED Diagnoses Final diagnoses:  Other headache syndrome    Rx / DC Orders ED Discharge Orders    None       Ripley Fraise, MD 12/18/19 0120

## 2019-12-18 NOTE — Addendum Note (Signed)
Addended byTomi Likens, Tomiko Schoon R on: 12/18/2019 07:01 AM   Modules accepted: Orders

## 2019-12-18 NOTE — ED Notes (Signed)
ED Provider at bedside. 

## 2019-12-24 ENCOUNTER — Ambulatory Visit (INDEPENDENT_AMBULATORY_CARE_PROVIDER_SITE_OTHER): Admitting: Advanced Practice Midwife

## 2019-12-24 ENCOUNTER — Other Ambulatory Visit: Payer: Self-pay

## 2019-12-24 ENCOUNTER — Encounter: Payer: Self-pay | Admitting: Advanced Practice Midwife

## 2019-12-24 ENCOUNTER — Encounter: Payer: Self-pay | Admitting: Internal Medicine

## 2019-12-24 ENCOUNTER — Ambulatory Visit (INDEPENDENT_AMBULATORY_CARE_PROVIDER_SITE_OTHER): Admitting: Internal Medicine

## 2019-12-24 ENCOUNTER — Other Ambulatory Visit (HOSPITAL_COMMUNITY)
Admission: RE | Admit: 2019-12-24 | Discharge: 2019-12-24 | Disposition: A | Discharge status: Home / Self Care | Source: Ambulatory Visit | Attending: Advanced Practice Midwife | Admitting: Advanced Practice Midwife

## 2019-12-24 VITALS — BP 110/78 | HR 96 | Ht 66.0 in | Wt 266.0 lb

## 2019-12-24 VITALS — BP 102/76 | HR 80 | Ht 66.0 in | Wt 266.0 lb

## 2019-12-24 DIAGNOSIS — Z01419 Encounter for gynecological examination (general) (routine) without abnormal findings: Secondary | ICD-10-CM | POA: Insufficient documentation

## 2019-12-24 DIAGNOSIS — E221 Hyperprolactinemia: Secondary | ICD-10-CM

## 2019-12-24 DIAGNOSIS — D352 Benign neoplasm of pituitary gland: Secondary | ICD-10-CM | POA: Insufficient documentation

## 2019-12-24 DIAGNOSIS — Z3169 Encounter for other general counseling and advice on procreation: Secondary | ICD-10-CM

## 2019-12-24 DIAGNOSIS — R7303 Prediabetes: Secondary | ICD-10-CM | POA: Diagnosis not present

## 2019-12-24 MED ORDER — PRENATAL VITAMIN 27-0.8 MG PO TABS: 1.0000 | ORAL_TABLET | Freq: Every day | ORAL | 5 refills | Status: DC

## 2019-12-24 NOTE — Progress Notes (Signed)
GYNECOLOGY ANNUAL PREVENTATIVE CARE ENCOUNTER NOTE  Subjective:   Amber [name] is a 39 y.o. G0P0000 female here for a routine annual gynecologic exam.  Current complaints: desire to conceive.  Is followed by Endocrinology for Pituitary Adenoma.  This was treated surgically in the past with good resolution and return to having menstrual cycles.  It then grew back and is now treated with medication.  This has been effective and her cycles have returned after having stopped with recurrence.  Has purchased ovulation testing and wants to pursue pregnancy soon. .   Denies abnormal vaginal bleeding, discharge, pelvic pain, problems with intercourse or other gynecologic concerns.    Gynecologic History Patient's last menstrual period was 12/17/2019. Contraception: none Last Pap: more than 3 years. Results were: normal Last mammogram: never.   Will schedule as she turns 39 this month.  Obstetric History OB History  Gravida Para Term Preterm AB Living  0 0 0 0 0 0  SAB TAB Ectopic Multiple Live Births  0 0 0 0 0    Past Medical History:  Diagnosis Date  . Anxiety   . Asthma   . Depression   . GERD (gastroesophageal reflux disease)   . History of chicken pox   . History of migraine headaches   . Pituitary adenoma ([place])   . Seizures ([place])     Past Surgical History:  Procedure Laterality Date  . APPENDECTOMY    . CHOLECYSTECTOMY    . Pituiary Tumor     Tumor Removal Pituitary    Current Outpatient Medications on File Prior to Visit  Medication Sig Dispense Refill  . acetaminophen (TYLENOL) 500 MG tablet Take 500 mg by mouth as needed.    . cabergoline (DOSTINEX) 0.5 MG tablet Take 1 tablet (0.5 mg total) by mouth as directed. 1 mg twice a week and 0.5 mg once a week 65 tablet 2  . diphenhydrAMINE (BENADRYL) 50 MG tablet Take 50 mg by mouth at bedtime as needed for itching.    . escitalopram (LEXAPRO) 10 MG tablet Take 1 tablet (10 mg total) by mouth daily. 90 tablet 1  .  famotidine (PEPCID) 20 MG tablet Take 20 mg by mouth 2 (two) times daily.    . Galcanezumab-gnlm (EMGALITY) 120 MG/ML SOAJ Inject 120 mg into the skin every 28 (twenty-eight) days. 1 pen 11  . hydrOXYzine (ATARAX/VISTARIL) 25 MG tablet TAKE 1-3 TABLETS (25-75 MG TOTAL) BY MOUTH 3 (THREE) TIMES DAILY AS NEEDED FOR ANXIETY. 270 tablet 1  . metFORMIN (GLUCOPHAGE) 500 MG tablet Take 1 tablet (500 mg total) by mouth every morning. 90 tablet 3  . ondansetron (ZOFRAN ODT) 8 MG disintegrating tablet Take 1 tablet (8 mg total) by mouth every 8 (eight) hours as needed for nausea or vomiting. 20 tablet 3  . pantoprazole (PROTONIX) 40 MG tablet Take 1 tablet (40 mg total) by mouth 2 (two) times daily. Patient will need an appointment for further refills 60 tablet 3  . QUEtiapine (SEROQUEL) 100 MG tablet Take 1 tablet (100 mg total) by mouth at bedtime. 90 tablet 2  . Riboflavin 400 MG CAPS Take 400 mg by mouth daily.    . Rimegepant Sulfate (NURTEC) 75 MG TBDP Take 75 mg by mouth as needed (Daily as needed for a Migraine). Maximum 1 tablet in 24 hours.  Quantity 8. 8 tablet 5  . topiramate (TOPAMAX) 100 MG tablet TAKE ONE TABLET BY MOUTH TWICE A DAY 60 tablet 4  . traMADol (ULTRAM) 50 MG tablet  Take 1 tablet (50 mg total) by mouth every 6 (six) hours as needed. 30 tablet 2  . cyclobenzaprine (FLEXERIL) 5 MG tablet 1-2 tab po q hs (Patient not taking: Reported on 12/24/2019) 10 tablet 0  . magnesium oxide (MAG-OX) 400 MG tablet Take 400 mg by mouth daily. (Patient not taking: Reported on 12/24/2019)    . methylPREDNISolone (MEDROL DOSEPAK) 4 MG TBPK tablet Take by mouth as directed.    . methylPREDNISolone (MEDROL) 4 MG tablet Standard 6 day taper dose. (Patient not taking: Reported on 12/24/2019) 21 tablet 0   No current facility-administered medications on file prior to visit.    Allergies  Allergen Reactions  . Aspirin   . Chlorhexidine   . Ibuprofen   . Iodine   . Lunesta [Eszopiclone] Other (See  Comments)    GI issues  . Nsaids Nausea And Vomiting  . Shellfish Allergy     Tingle in mouth/throat  . Sulfa Antibiotics   . Tape Rash    Blisters     Social History   Socioeconomic History  . Marital status: Married    Spouse name: Jenny Reichmann  . Number of children: 0  . Years of education: Not on file  . Highest education level: 12th grade  Occupational History  . Occupation: unemployed  Tobacco Use  . Smoking status: Former Smoker    Packs/day: 1.00    Years: 15.00    Pack years: 15.00    Types: Cigarettes    Quit date: 01/24/2016    Years since quitting: 3.9  . Smokeless tobacco: Never Used  Vaping Use  . Vaping Use: Former  Substance and Sexual Activity  . Alcohol use: No  . Drug use: No  . Sexual activity: Yes    Birth control/protection: None  Other Topics Concern  . Not on file  Social History Narrative   Patient is right-handed. She lives with her husband in a 2 level home. She drinks I cup of coffee a day. She walks daily, and rides her recumbent bike QOD.   Social Determinants of Health   Financial Resource Strain:   . Difficulty of Paying Living Expenses: Not on file  Food Insecurity:   . Worried About Charity fundraiser in the Last Year: Not on file  . Ran Out of Food in the Last Year: Not on file  Transportation Needs:   . Lack of Transportation (Medical): Not on file  . Lack of Transportation (Non-Medical): Not on file  Physical Activity:   . Days of Exercise per Week: Not on file  . Minutes of Exercise per Session: Not on file  Stress:   . Feeling of Stress : Not on file  Social Connections:   . Frequency of Communication with Friends and Family: Not on file  . Frequency of Social Gatherings with Friends and Family: Not on file  . Attends Religious Services: Not on file  . Active Member of Clubs or Organizations: Not on file  . Attends Archivist Meetings: Not on file  . Marital Status: Not on file  Intimate Partner Violence:   .  Fear of Current or Ex-Partner: Not on file  . Emotionally Abused: Not on file  . Physically Abused: Not on file  . Sexually Abused: Not on file    Family History  Problem Relation Age of Onset  . Diabetes Mother   . Migraines Mother   . Hyperlipidemia Mother   . Bipolar disorder Mother   . Irritable  bowel syndrome Mother   . Diabetes Father   . Colon cancer Father   . Cancer Father        colon  . Hyperlipidemia Father   . Hypertension Father   . Bipolar disorder Brother   . Migraines Sister   . Other Neg Hx        pituitary disorder  . Esophageal cancer Neg Hx     The following portions of the patient's history were reviewed and updated as appropriate: allergies, current medications, past family history, past medical history, past social history, past surgical history and problem list.  Review of Systems Pertinent items noted in HPI and remainder of comprehensive ROS otherwise negative.   Objective:  BP 102/76   Pulse 80   Ht 5\' 6"  (1.676 m)   Wt 266 lb (120.7 kg)   LMP 12/17/2019   BMI 42.93 kg/m  CONSTITUTIONAL: Well-developed, well-nourished female in no acute distress.  HENT:  Normocephalic, atraumatic, External right and left ear normal. Oropharynx is clear and moist EYES: Conjunctivae and EOM are normal. No scleral icterus.  NECK: Normal range of motion, supple, no masses.  Normal thyroid.  SKIN: Skin is warm and dry. No rash noted. Not diaphoretic. No erythema. No pallor. NEUROLOGIC: Alert and oriented to person, place, and time. Normal muscle tone coordination. No cranial nerve deficit noted. PSYCHIATRIC: Normal mood and affect. Normal behavior. Normal judgment and thought content. CARDIOVASCULAR: Normal heart rate noted, regular rhythm RESPIRATORY: Clear to auscultation bilaterally. Effort and breath sounds normal, no problems with respiration noted. BREASTS: Symmetric in size. No masses, skin changes, nipple drainage, or lymphadenopathy. ABDOMEN: Soft,  normal bowel sounds, no distention noted.  No tenderness, rebound or guarding.  PELVIC: Normal appearing external genitalia; normal appearing vaginal mucosa and cervix.  No abnormal discharge noted.  Pap smear obtained.  Normal uterine size, no other palpable masses, no uterine or adnexal tenderness. MUSCULOSKELETAL: Normal range of motion. No tenderness.  No cyanosis, clubbing, or edema.    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 Mammogram scheduled for this month Routine preventative health maintenance measures emphasized.  Long discussion of ovulation, general conception principles.  Recommend she go directly to reproductive endocrinologist for a consultation, given her known condition and age.  She sees her Endocrinologist this month and will get referral from him.  Please refer to After Visit Summary for other counseling recommendations.   Seabron Spates, CNM

## 2019-12-24 NOTE — Patient Instructions (Signed)
Health Maintenance, Female Adopting a healthy lifestyle and getting preventive care are important in promoting health and wellness. Ask your health care provider about:  The right schedule for you to have regular tests and exams.  Things you can do on your own to prevent diseases and keep yourself healthy. What should I know about diet, weight, and exercise? Eat a healthy diet   Eat a diet that includes plenty of vegetables, fruits, low-fat dairy products, and lean protein.  Do not eat a lot of foods that are high in solid fats, added sugars, or sodium. Maintain a healthy weight Body mass index (BMI) is used to identify weight problems. It estimates body fat based on height and weight. Your health care provider can help determine your BMI and help you achieve or maintain a healthy weight. Get regular exercise Get regular exercise. This is one of the most important things you can do for your health. Most adults should:  Exercise for at least 150 minutes each week. The exercise should increase your heart rate and make you sweat (moderate-intensity exercise).  Do strengthening exercises at least twice a week. This is in addition to the moderate-intensity exercise.  Spend less time sitting. Even light physical activity can be beneficial. Watch cholesterol and blood lipids Have your blood tested for lipids and cholesterol at 40 years of age, then have this test every 5 years. Have your cholesterol levels checked more often if:  Your lipid or cholesterol levels are high.  You are older than 40 years of age.  You are at high risk for heart disease. What should I know about cancer screening? Depending on your health history and family history, you may need to have cancer screening at various ages. This may include screening for:  Breast cancer.  Cervical cancer.  Colorectal cancer.  Skin cancer.  Lung cancer. What should I know about heart disease, diabetes, and high blood  pressure? Blood pressure and heart disease  High blood pressure causes heart disease and increases the risk of stroke. This is more likely to develop in people who have high blood pressure readings, are of African descent, or are overweight.  Have your blood pressure checked: ? Every 3-5 years if you are 18-39 years of age. ? Every year if you are 40 years old or older. Diabetes Have regular diabetes screenings. This checks your fasting blood sugar level. Have the screening done:  Once every three years after age 40 if you are at a normal weight and have a low risk for diabetes.  More often and at a younger age if you are overweight or have a high risk for diabetes. What should I know about preventing infection? Hepatitis B If you have a higher risk for hepatitis B, you should be screened for this virus. Talk with your health care provider to find out if you are at risk for hepatitis B infection. Hepatitis C Testing is recommended for:  Everyone born from 1945 through 1965.  Anyone with known risk factors for hepatitis C. Sexually transmitted infections (STIs)  Get screened for STIs, including gonorrhea and chlamydia, if: ? You are sexually active and are younger than 40 years of age. ? You are older than 40 years of age and your health care provider tells you that you are at risk for this type of infection. ? Your sexual activity has changed since you were last screened, and you are at increased risk for chlamydia or gonorrhea. Ask your health care provider if   you are at risk.  Ask your health care provider about whether you are at high risk for HIV. Your health care provider may recommend a prescription medicine to help prevent HIV infection. If you choose to take medicine to prevent HIV, you should first get tested for HIV. You should then be tested every 3 months for as long as you are taking the medicine. Pregnancy  If you are about to stop having your period (premenopausal) and  you may become pregnant, seek counseling before you get pregnant.  Take 400 to 800 micrograms (mcg) of folic acid every day if you become pregnant.  Ask for birth control (contraception) if you want to prevent pregnancy. Osteoporosis and menopause Osteoporosis is a disease in which the bones lose minerals and strength with aging. This can result in bone fractures. If you are 65 years old or older, or if you are at risk for osteoporosis and fractures, ask your health care provider if you should:  Be screened for bone loss.  Take a calcium or vitamin D supplement to lower your risk of fractures.  Be given hormone replacement therapy (HRT) to treat symptoms of menopause. Follow these instructions at home: Lifestyle  Do not use any products that contain nicotine or tobacco, such as cigarettes, e-cigarettes, and chewing tobacco. If you need help quitting, ask your health care provider.  Do not use street drugs.  Do not share needles.  Ask your health care provider for help if you need support or information about quitting drugs. Alcohol use  Do not drink alcohol if: ? Your health care provider tells you not to drink. ? You are pregnant, may be pregnant, or are planning to become pregnant.  If you drink alcohol: ? Limit how much you use to 0-1 drink a day. ? Limit intake if you are breastfeeding.  Be aware of how much alcohol is in your drink. In the [place] one drink equals one 12 oz bottle of beer (355 mL), one 5 oz glass of wine (148 mL), or one 1 oz glass of hard liquor (44 mL). General instructions  Schedule regular health, dental, and eye exams.  Stay current with your vaccines.  Tell your health care provider if: ? You often feel depressed. ? You have ever been abused or do not feel safe at home. Summary  Adopting a healthy lifestyle and getting preventive care are important in promoting health and wellness.  Follow your health care provider's instructions about healthy  diet, exercising, and getting tested or screened for diseases.  Follow your health care provider's instructions on monitoring your cholesterol and blood pressure. This information is not intended to replace advice given to you by your health care provider. Make sure you discuss any questions you have with your health care provider. Document Revised: 02/28/2018 Document Reviewed: 02/28/2018 Elsevier Patient Education  2020 Elsevier Inc.  

## 2019-12-24 NOTE — Progress Notes (Signed)
Name: Amber [name]  MRN/ DOB: [id number], May 22, 1980    Age/ Sex: 39 y.o., female     PCP: Shelda Pal, DO   Reason for Endocrinology Evaluation: Hyperprolactinemia      Initial Endocrinology Clinic Visit: 03/02/2016    PATIENT IDENTIFIER: Amber [name] is a 39 y.o., female with a past medical history of Prolactinoma, GERD, Asthma and migraine headaches . She has followed with [facility] since 03/02/2016 for consultative assistance with management of her hyperprolactinemia .   HISTORICAL SUMMARY: The patient was first diagnosed with hyperprolactinemia in 1996 during evaluation for primary amenorrhea ( highest reading 198 ). She is S/P resection of prolactinoma in 2009 [place], [place] due to size 3 cm, per pt she had a sphenoidal sinus penetration. Per pt prolactin levels normalized and menses resumed.  Pt was noted with elevated prolactin levels again 2015 and was started on Cabergoline, imaging have been reported at " no tumor"     Cabergoline was started 2019 Bromocriptine was added to cabergoline 05/2019  On her initial visit with me in 08/2019, we stopped the Bromocriptine and increased cabergoline. Since the dose exceeded 2 mg/ week , we proceed with an echocardiogram , which showed normal valve structure but there was a question about a retro aortic coronary artery. CT angio was normal.     Repeat pituitary MRI in 08/2019 showed regression of pituitary gland   SUBJECTIVE:    Today (12/24/2019):  Amber [name] is here for a follow up on hyperprolactinemia   Has regular menstruation  Denies nipple discharge  No vision changes in   No FH of pituitary disease.      Of note the pt had ~9 ED visits for headaches. Follows with Dr. Tomi Likens  She is on Cabergoline 0.5 mg, 2.5 mg a week   Two tabs on Mondays, 0.5 mg on wednesday and 2 tabs on Friday   Denies GI issues.   She is interested in conceiving in the near future    HISTORY:   Past Medical History:  Past Medical History:  Diagnosis Date  . Anxiety   . Asthma   . Depression   . GERD (gastroesophageal reflux disease)   . History of chicken pox   . History of migraine headaches   . Pituitary adenoma ([place])   . Seizures ([place])    Past Surgical History:  Past Surgical History:  Procedure Laterality Date  . APPENDECTOMY    . CHOLECYSTECTOMY    . Pituiary Tumor     Tumor Removal Pituitary    Social History:  reports that she quit smoking about 3 years ago. Her smoking use included cigarettes. She has a 15.00 pack-year smoking history. She has never used smokeless tobacco. She reports that she does not drink alcohol and does not use drugs. Family History:  Family History  Problem Relation Age of Onset  . Diabetes Mother   . Migraines Mother   . Hyperlipidemia Mother   . Bipolar disorder Mother   . Irritable bowel syndrome Mother   . Diabetes Father   . Colon cancer Father   . Cancer Father        colon  . Hyperlipidemia Father   . Hypertension Father   . Bipolar disorder Brother   . Migraines Sister   . Other Neg Hx        pituitary disorder  . Esophageal cancer Neg Hx      HOME MEDICATIONS: Allergies as of 12/24/2019  Reactions   Aspirin    Chlorhexidine    Ibuprofen    Iodine    Lunesta [eszopiclone] Other (See Comments)   GI issues   Nsaids Nausea And Vomiting   Shellfish Allergy    Tingle in mouth/throat   Sulfa Antibiotics    Tape Rash   Blisters      Medication List       Accurate as of December 24, 2019  1:16 PM. If you have any questions, ask your nurse or doctor.        STOP taking these medications   Hyoscyamine Sulfate SL 0.125 MG Subl Commonly known as: Levsin/SL Stopped by: Hansel Feinstein, CNM   predniSONE 20 MG tablet Commonly known as: DELTASONE Stopped by: Hansel Feinstein, CNM     TAKE these medications   cabergoline 0.5 MG tablet Commonly known as: DOSTINEX Take 1 tablet (0.5 mg total) by mouth as  directed. 1 mg twice a week and 0.5 mg once a week   cyclobenzaprine 5 MG tablet Commonly known as: FLEXERIL 1-2 tab po q hs   diphenhydrAMINE 50 MG tablet Commonly known as: BENADRYL Take 50 mg by mouth at bedtime as needed for itching.   Emgality 120 MG/ML Soaj Generic drug: Galcanezumab-gnlm Inject 120 mg into the skin every 28 (twenty-eight) days.   escitalopram 10 MG tablet Commonly known as: LEXAPRO Take 1 tablet (10 mg total) by mouth daily.   famotidine 20 MG tablet Commonly known as: PEPCID Take 20 mg by mouth 2 (two) times daily.   hydrOXYzine 25 MG tablet Commonly known as: ATARAX/VISTARIL TAKE 1-3 TABLETS (25-75 MG TOTAL) BY MOUTH 3 (THREE) TIMES DAILY AS NEEDED FOR ANXIETY.   magnesium oxide 400 MG tablet Commonly known as: MAG-OX Take 400 mg by mouth daily.   metFORMIN 500 MG tablet Commonly known as: GLUCOPHAGE Take 1 tablet (500 mg total) by mouth every morning.   methylPREDNISolone 4 MG tablet Commonly known as: Medrol Standard 6 day taper dose.   methylPREDNISolone 4 MG Tbpk tablet Commonly known as: MEDROL DOSEPAK Take by mouth as directed.   Nurtec 75 MG Tbdp Generic drug: Rimegepant Sulfate Take 75 mg by mouth as needed (Daily as needed for a Migraine). Maximum 1 tablet in 24 hours.  Quantity 8.   ondansetron 8 MG disintegrating tablet Commonly known as: Zofran ODT Take 1 tablet (8 mg total) by mouth every 8 (eight) hours as needed for nausea or vomiting.   pantoprazole 40 MG tablet Commonly known as: PROTONIX Take 1 tablet (40 mg total) by mouth 2 (two) times daily. Patient will need an appointment for further refills   Prenatal Vitamin 27-0.8 MG Tabs Take 1 tablet by mouth daily. Started by: Hansel Feinstein, CNM   QUEtiapine 100 MG tablet Commonly known as: SEROQUEL Take 1 tablet (100 mg total) by mouth at bedtime.   Riboflavin 400 MG Caps Take 400 mg by mouth daily.   topiramate 100 MG tablet Commonly known as: TOPAMAX TAKE ONE  TABLET BY MOUTH TWICE A DAY   traMADol 50 MG tablet Commonly known as: ULTRAM Take 1 tablet (50 mg total) by mouth every 6 (six) hours as needed.   TYLENOL 500 MG tablet Generic drug: acetaminophen Take 500 mg by mouth as needed.         OBJECTIVE:   PHYSICAL EXAM: VS: BP 110/78   Pulse 96   Ht 5\' 6"  (1.676 m)   Wt 266 lb (120.7 kg)   LMP 12/17/2019   SpO2  97%   BMI 42.93 kg/m    EXAM: General: Pt appears well and is in NAD  Neck: General: Supple without adenopathy. Thyroid: Thyroid size normal.  No goiter or nodules appreciated.  Lungs: Clear with good BS bilat with no rales, rhonchi, or wheezes  Heart: Auscultation: RRR.  Abdomen: Normoactive bowel sounds, soft, nontender, without masses or organomegaly palpable  Extremities:  BL LE: No pretibial edema normal ROM and strength.  Mental Status: Judgment, insight: Intact Orientation: Oriented to time, place, and person Mood and affect: No depression, anxiety, or agitation     DATA REVIEWED:  Results for VAUNDA, GUTTERMAN (MRN [id number]) as of 12/25/2019 15:41  Ref. Range 12/24/2019 13:16  Sodium Latest Ref Range: 135 - 146 mmol/L 141  Potassium Latest Ref Range: 3.5 - 5.3 mmol/L 4.1  Chloride Latest Ref Range: 98 - 110 mmol/L 109  CO2 Latest Ref Range: 20 - 32 mmol/L 23  Glucose Latest Ref Range: 65 - 99 mg/dL 104 (H)  BUN Latest Ref Range: 7 - 25 mg/dL 11  Creatinine Latest Ref Range: 0.50 - 1.10 mg/dL 0.99  Calcium Latest Ref Range: 8.6 - 10.2 mg/dL 9.1  BUN/Creatinine Ratio Latest Ref Range: 6 - 22 (calc) NOT APPLICABLE  AG Ratio Latest Ref Range: 1.0 - 2.5 (calc) 1.5  AST Latest Ref Range: 10 - 30 U/L 10  ALT Latest Ref Range: 6 - 29 U/L 8  Total Protein Latest Ref Range: 6.1 - 8.1 g/dL 6.7  Total Bilirubin Latest Ref Range: 0.2 - 1.2 mg/dL 0.4  Alkaline phosphatase (APISO) Latest Ref Range: 31 - 125 U/L 54  Globulin Latest Ref Range: 1.9 - 3.7 g/dL (calc) 2.7  Prolactin Latest Units: ng/mL 29.8   Albumin MSPROF Latest Ref Range: 3.6 - 5.1 g/dL 4.0       MRI 08/31/2019  Dedicated pituitary imaging. Gland size appears slightly smaller now compared to 2018 (series 16, image 9 today versus series 17, image 6 previously). No suprasellar mass or mass effect. Infundibulum and hypothalamus appear normal.  Regressed hypoenhancing area in the left inferior and lateral aspect of the gland. Today there is only subtle heterogeneous enhancement on that side (series 7, image 10) encompassing 3-4 mm. The cavernous sinuses remain normal. Normal clivus. No other abnormal enhancement.  IMPRESSION: 1. Regression of pituitary microadenoma since 2018, subtle residual heterogeneity in the left aspect of the gland encompassing 3-4 mm.  2. No new intracranial abnormality. Stable mild nonspecific cerebral white matter signal changes. ASSESSMENT / PLAN / RECOMMENDATIONS:   1. Hx of Pituitary Macroadenoma , S/P Transphenoidal surgery in 2009:  - Pr pt she had a transphenoidal surgery for a 3 cm pituitary adenoma in 2009 - Her last MRI was in 202021 indicating regression  - Labs have been normal to include TSH, ACTH and IGF-1    2. Hyperprolactinemia :  - Diagnosed in 1996 - Normalization of levels following transphenoidal pituitary resection in 2009 , restarted on cabergoline in 2015 and again in 2019.  - In March 2021, bromocriptine was added to her regimen by previouse endocrinologist, this was discontinued in 08/2019 and increased cabergoline.  - Pt will need echocardiogram in 2023  - Repeat prolactin is normal     Medications   Continue cabergoline 0.5 mg ,a total of  2.5 mg weekly (Two tabs on Mondays, 1 tab on wednesday and 2 tabs on Friday    2. Pre-Diabetes :   - Last a1c was normal at 5.4%  - I have  encouraged her to continue with weight loss and life style changes      Continue Metformin 500 mg daily     F/U in 4 months      Signed electronically by: Mack Guise, MD  [facility] End[facility]inology  [address] [place], [place] [address] Phone: [phone] FAX: [phone]      CC: Shelda Pal, [address] [place] [address] Phone: [phone]  Fax: [phone]   Return to Endocrinology clinic as below: Future Appointments  Date Time Provider [place]  04/07/2020  2:30 PM Pieter Partridge, DO [facility] None

## 2019-12-25 ENCOUNTER — Ambulatory Visit: Admitting: Internal Medicine

## 2019-12-25 DIAGNOSIS — R7303 Prediabetes: Secondary | ICD-10-CM | POA: Insufficient documentation

## 2019-12-25 LAB — COMPREHENSIVE METABOLIC PANEL
AG Ratio: 1.5 (calc) (ref 1.0–2.5)
ALT: 8 U/L (ref 6–29)
AST: 10 U/L (ref 10–30)
Albumin: 4 g/dL (ref 3.6–5.1)
Alkaline phosphatase (APISO): 54 U/L (ref 31–125)
BUN: 11 mg/dL (ref 7–25)
CO2: 23 mmol/L (ref 20–32)
Calcium: 9.1 mg/dL (ref 8.6–10.2)
Chloride: 109 mmol/L (ref 98–110)
Creat: 0.99 mg/dL (ref 0.50–1.10)
Globulin: 2.7 g/dL (calc) (ref 1.9–3.7)
Glucose, Bld: 104 mg/dL — ABNORMAL HIGH (ref 65–99)
Potassium: 4.1 mmol/L (ref 3.5–5.3)
Sodium: 141 mmol/L (ref 135–146)
Total Bilirubin: 0.4 mg/dL (ref 0.2–1.2)
Total Protein: 6.7 g/dL (ref 6.1–8.1)

## 2019-12-25 LAB — CYTOLOGY - PAP
Comment: NEGATIVE
Diagnosis: NEGATIVE
High risk HPV: NEGATIVE

## 2019-12-25 LAB — PROLACTIN: Prolactin: 29.8 ng/mL

## 2020-01-10 ENCOUNTER — Encounter: Payer: Self-pay | Admitting: Internal Medicine

## 2020-01-10 DIAGNOSIS — F418 Other specified anxiety disorders: Secondary | ICD-10-CM

## 2020-01-10 MED ORDER — ESCITALOPRAM OXALATE 10 MG PO TABS: 10.0000 mg | ORAL_TABLET | Freq: Every day | ORAL | 2 refills | Status: DC

## 2020-01-10 MED ORDER — QUETIAPINE FUMARATE 100 MG PO TABS: 100.0000 mg | ORAL_TABLET | Freq: Every day | ORAL | 2 refills | Status: DC

## 2020-01-13 ENCOUNTER — Other Ambulatory Visit: Payer: Self-pay

## 2020-01-13 ENCOUNTER — Encounter: Payer: Self-pay | Admitting: Internal Medicine

## 2020-01-13 ENCOUNTER — Other Ambulatory Visit: Payer: Self-pay | Admitting: Neurology

## 2020-01-13 ENCOUNTER — Other Ambulatory Visit: Payer: Self-pay | Admitting: Gastroenterology

## 2020-01-13 ENCOUNTER — Other Ambulatory Visit: Payer: Self-pay | Admitting: Internal Medicine

## 2020-01-13 DIAGNOSIS — E221 Hyperprolactinemia: Secondary | ICD-10-CM

## 2020-01-13 MED ORDER — TRAMADOL HCL 50 MG PO TABS
50.0000 mg | ORAL_TABLET | Freq: Four times a day (QID) | ORAL | 2 refills | Status: DC | PRN
Start: 2020-01-13 — End: 2020-04-15

## 2020-01-13 MED ORDER — PANTOPRAZOLE SODIUM 40 MG PO TBEC
40.0000 mg | DELAYED_RELEASE_TABLET | Freq: Two times a day (BID) | ORAL | 1 refills | Status: DC
Start: 2020-01-13 — End: 2020-07-17

## 2020-01-13 MED ORDER — ONDANSETRON 8 MG PO TBDP: 8.0000 mg | ORAL_TABLET | Freq: Three times a day (TID) | ORAL | 5 refills | Status: DC | PRN

## 2020-01-13 MED ORDER — TOPIRAMATE 100 MG PO TABS
100.0000 mg | ORAL_TABLET | Freq: Two times a day (BID) | ORAL | 4 refills | Status: DC
Start: 2020-01-13 — End: 2020-09-02

## 2020-01-13 NOTE — Progress Notes (Signed)
Error

## 2020-02-06 ENCOUNTER — Ambulatory Visit: Admitting: Gastroenterology

## 2020-02-22 ENCOUNTER — Encounter (HOSPITAL_BASED_OUTPATIENT_CLINIC_OR_DEPARTMENT_OTHER): Payer: Self-pay | Admitting: *Deleted

## 2020-02-22 ENCOUNTER — Other Ambulatory Visit: Payer: Self-pay

## 2020-02-22 ENCOUNTER — Emergency Department (HOSPITAL_BASED_OUTPATIENT_CLINIC_OR_DEPARTMENT_OTHER)

## 2020-02-22 DIAGNOSIS — Z79899 Other long term (current) drug therapy: Secondary | ICD-10-CM | POA: Diagnosis not present

## 2020-02-22 DIAGNOSIS — J45909 Unspecified asthma, uncomplicated: Secondary | ICD-10-CM | POA: Insufficient documentation

## 2020-02-22 DIAGNOSIS — R110 Nausea: Secondary | ICD-10-CM | POA: Diagnosis not present

## 2020-02-22 DIAGNOSIS — R059 Cough, unspecified: Secondary | ICD-10-CM

## 2020-02-22 DIAGNOSIS — J069 Acute upper respiratory infection, unspecified: Secondary | ICD-10-CM | POA: Insufficient documentation

## 2020-02-22 DIAGNOSIS — Z20822 Contact with and (suspected) exposure to covid-19: Secondary | ICD-10-CM | POA: Insufficient documentation

## 2020-02-22 DIAGNOSIS — Z87891 Personal history of nicotine dependence: Secondary | ICD-10-CM | POA: Diagnosis not present

## 2020-02-22 DIAGNOSIS — E119 Type 2 diabetes mellitus without complications: Secondary | ICD-10-CM | POA: Insufficient documentation

## 2020-02-22 DIAGNOSIS — Z7984 Long term (current) use of oral hypoglycemic drugs: Secondary | ICD-10-CM | POA: Insufficient documentation

## 2020-02-22 LAB — RESP PANEL BY RT-PCR (FLU A&B, COVID) ARPGX2
Influenza A by PCR: NEGATIVE
Influenza B by PCR: NEGATIVE
SARS Coronavirus 2 by RT PCR: NEGATIVE

## 2020-02-22 MED ORDER — ACETAMINOPHEN 325 MG PO TABS
650.0000 mg | ORAL_TABLET | Freq: Once | ORAL | Status: AC | PRN
  Administered 2020-02-22: 650 mg via ORAL
  Filled 2020-02-22: qty 2

## 2020-02-22 NOTE — ED Triage Notes (Signed)
Pt reports 2 days of cough, fever, chills, sore throat, chest discomfort. Reports temp 102.6 at home

## 2020-02-23 ENCOUNTER — Emergency Department (HOSPITAL_BASED_OUTPATIENT_CLINIC_OR_DEPARTMENT_OTHER)
Admission: EM | Admit: 2020-02-23 | Discharge: 2020-02-23 | Disposition: A | Discharge status: Home / Self Care | Attending: Emergency Medicine | Admitting: Emergency Medicine

## 2020-02-23 DIAGNOSIS — R059 Cough, unspecified: Secondary | ICD-10-CM

## 2020-02-23 DIAGNOSIS — J069 Acute upper respiratory infection, unspecified: Secondary | ICD-10-CM

## 2020-02-23 NOTE — ED Provider Notes (Signed)
[facility] EMERGENCY DEPARTMENT Provider Note   CSN: [id number] Arrival date & time: 02/22/20  1955     History Chief Complaint  Patient presents with  . Fever  . Cough    Amber [name] is a 39 y.o. female.  The history is provided by the patient.  Fever Severity:  Moderate Onset quality:  Gradual Timing:  Intermittent Progression:  Unchanged Chronicity:  New Worsened by:  Nothing Associated symptoms: chills, cough, nausea and sore throat   Associated symptoms: no chest pain and no vomiting   Cough Associated symptoms: chills, fever and sore throat   Associated symptoms: no chest pain    Patient reports fevers, chills, cough over the past 2 days.  She reports mild sore throat.  No vomiting.  She is told by nurse triage to go to the ER    Past Medical History:  Diagnosis Date  . Anxiety   . Asthma   . Depression   . GERD (gastroesophageal reflux disease)   . History of chicken pox   . History of migraine headaches   . Pituitary adenoma ([place])   . Seizures [facility])     Patient Active Problem List   Diagnosis Date Noted  . Pre-diabetes 12/25/2019  . Infertility counseling 12/24/2019  . Well woman exam 12/24/2019  . Hyperglycemia 06/05/2019  . Insomnia 01/07/2019  . Bilateral hand pain 11/09/2018  . Agoraphobia 01/18/2018  . Anxiety with depression 03/24/2017  . Migraine 05/11/2016  . Pituitary adenoma ([place]) 03/05/2016  . Hyperprolactinemia ([place]) 03/02/2016    Past Surgical History:  Procedure Laterality Date  . APPENDECTOMY    . CHOLECYSTECTOMY    . Pituiary Tumor     Tumor Removal Pituitary     OB History    Gravida  0   Para  0   Term  0   Preterm  0   AB  0   Living  0     SAB  0   TAB  0   Ectopic  0   Multiple  0   Live Births  0           Family History  Problem Relation Age of Onset  . Diabetes Mother   . Migraines Mother   . Hyperlipidemia Mother   . Bipolar disorder Mother   . Irritable bowel  syndrome Mother   . Diabetes Father   . Colon cancer Father   . Cancer Father        colon  . Hyperlipidemia Father   . Hypertension Father   . Bipolar disorder Brother   . Migraines Sister   . Other Neg Hx        pituitary disorder  . Esophageal cancer Neg Hx     Social History   Tobacco Use  . Smoking status: Former Smoker    Packs/day: 1.00    Years: 15.00    Pack years: 15.00    Types: Cigarettes    Quit date: 01/24/2016    Years since quitting: 4.0  . Smokeless tobacco: Never Used  Vaping Use  . Vaping Use: Former  Substance Use Topics  . Alcohol use: No  . Drug use: No    Home Medications Prior to Admission medications   Medication Sig Start Date End Date Taking? Authorizing Provider  acetaminophen (TYLENOL) 500 MG tablet Take 500 mg by mouth as needed.    [provider]  cabergoline (DOSTINEX) 0.5 MG tablet Take 1 tablet (0.5 mg total) by  mouth as directed. 1 mg twice a week and 0.5 mg once a week 08/29/19   Shamleffer, Melanie Crazier, MD  escitalopram (LEXAPRO) 10 MG tablet Take 1 tablet (10 mg total) by mouth daily. 01/10/20   Shelda Pal, DO  famotidine (PEPCID) 20 MG tablet Take 20 mg by mouth 2 (two) times daily.    [provider]  Galcanezumab-gnlm (EMGALITY) 120 MG/ML SOAJ Inject 120 mg into the skin every 28 (twenty-eight) days. 06/03/19   Tomi Likens, Adam R, DO  hydrOXYzine (ATARAX/VISTARIL) 25 MG tablet TAKE 1-3 TABLETS (25-75 MG TOTAL) BY MOUTH 3 (THREE) TIMES DAILY AS NEEDED FOR ANXIETY. 01/22/18   Shelda Pal, DO  metFORMIN (GLUCOPHAGE) 500 MG tablet Take 1 tablet (500 mg total) by mouth every morning. 06/24/19   Renato Shin, MD  ondansetron (ZOFRAN ODT) 8 MG disintegrating tablet Take 1 tablet (8 mg total) by mouth every 8 (eight) hours as needed for nausea or vomiting. 01/13/20   Tomi Likens, Adam R, DO  pantoprazole (PROTONIX) 40 MG tablet Take 1 tablet (40 mg total) by mouth 2 (two) times daily. Patient will need an  appointment for further refills 01/13/20   Cirigliano, Vito V, DO  Prenatal Vit-Fe Fumarate-FA (PRENATAL VITAMIN) 27-0.8 MG TABS Take 1 tablet by mouth daily. 12/24/19   Seabron Spates, CNM  QUEtiapine (SEROQUEL) 100 MG tablet Take 1 tablet (100 mg total) by mouth at bedtime. 01/10/20   Shelda Pal, DO  Riboflavin 400 MG CAPS Take 400 mg by mouth daily.    [provider]  Rimegepant Sulfate (NURTEC) 75 MG TBDP Take 75 mg by mouth as needed (Daily as needed for a Migraine). Maximum 1 tablet in 24 hours.  Quantity 8. 09/17/19   Tomi Likens, Adam R, DO  topiramate (TOPAMAX) 100 MG tablet Take 1 tablet (100 mg total) by mouth 2 (two) times daily. 01/13/20   Pieter Partridge, DO  traMADol (ULTRAM) 50 MG tablet Take 1 tablet (50 mg total) by mouth every 6 (six) hours as needed. 01/13/20   Pieter Partridge, DO    Allergies    Aspirin, Chlorhexidine, Ibuprofen, Iodine, Lunesta [eszopiclone], Nsaids, Shellfish allergy, Sulfa antibiotics, and Tape  Review of Systems   Review of Systems  Constitutional: Positive for chills and fever.  HENT: Positive for sore throat.   Respiratory: Positive for cough.        Denies hemoptysis  Cardiovascular: Negative for chest pain.  Gastrointestinal: Positive for nausea. Negative for vomiting.  All other systems reviewed and are negative.   Physical Exam Updated Vital Signs BP 109/74   Pulse 85   Temp 98.9 F (37.2 C) (Oral)   Resp 14   Ht 1.676 m (5\' 6" )   Wt 124.3 kg   LMP 01/20/2020   SpO2 98%   BMI 44.22 kg/m   Physical Exam CONSTITUTIONAL: Well developed/well nourished HEAD: Normocephalic/atraumatic EYES: EOMI/PERRL ENMT: Mucous membranes moist uvula midline, no erythema or exudate NECK: supple no meningeal signs SPINE/BACK:entire spine nontender CV: S1/S2 noted, no murmurs/rubs/gallops noted LUNGS: Lungs are clear to auscultation bilaterally, no apparent distress ABDOMEN: soft NEURO: Pt is awake/alert/appropriate, moves all  extremitiesx4.  No facial droop.   EXTREMITIES: pulses normal/equal, full ROM SKIN: warm, color normal PSYCH: no abnormalities of mood noted, alert and oriented to situation  ED Results / Procedures / Treatments   Labs (all labs ordered are listed, but only abnormal results are displayed) Labs Reviewed  RESP PANEL BY RT-PCR (FLU A&B, COVID) ARPGX2  EKG None  Radiology DG Chest Port 1 View  Result Date: 02/22/2020 CLINICAL DATA:  Cough and fever for 2-3 days, non smoker EXAM: PORTABLE CHEST 1 VIEW COMPARISON:  CT 10/31/2019, radiograph 10/21/2019 FINDINGS: Accounting for body habitus and some basilar atelectatic changes, the lungs are otherwise clear. No consolidation, features of edema, pneumothorax, or effusion. Pulmonary vascularity is normally distributed. The cardiomediastinal contours are unremarkable. No acute osseous or soft tissue abnormality. IMPRESSION: Atelectasis, otherwise no acute cardiopulmonary process. Electronically Signed   By: Lovena Le M.D.   On: 02/22/2020 21:46    Procedures Procedures   Medications Ordered in ED Medications  acetaminophen (TYLENOL) tablet 650 mg (650 mg Oral Given 02/22/20 2018)    ED Course  I have reviewed the triage vital signs and the nursing notes.  Pertinent labs & imaging results that were available during my care of the patient were reviewed by me and considered in my medical decision making (see chart for details).    MDM Rules/Calculators/A&P                          Amber [name] was evaluated in Emergency Department on 02/23/2020 for the symptoms described in the history of present illness. She was evaluated in the context of the global COVID-19 pandemic, which necessitated consideration that the patient might be at risk for infection with the SARS-CoV-2 virus that causes COVID-19. Institutional protocols and algorithms that pertain to the evaluation of patients at risk for COVID-19 are in a state of rapid change based  on information released by regulatory bodies including the CDC and federal and state organizations. These policies and algorithms were followed during the patient's care in the ED.   Probable viral illness.  Will discharge home Final Clinical Impression(s) / ED Diagnoses Final diagnoses:  Viral URI with cough    Rx / DC Orders ED Discharge Orders    None       Ripley Fraise, MD 02/23/20 0131

## 2020-02-23 NOTE — ED Notes (Signed)
EDP at bedside  

## 2020-02-27 ENCOUNTER — Ambulatory Visit: Payer: Self-pay | Admitting: *Deleted

## 2020-02-27 NOTE — Telephone Encounter (Signed)
Please contact patient regarding symptoms mentioned below.

## 2020-02-27 NOTE — Telephone Encounter (Signed)
   Message from Beverley Fiedler sent at 02/27/2020 4:02 PM EST  Summary: Clinical Advice   Patient has cough low grade fever and sore throat and would like to speak with nurse on recommendations. Please advise

## 2020-02-28 ENCOUNTER — Encounter: Payer: Self-pay | Admitting: Family Medicine

## 2020-02-28 ENCOUNTER — Telehealth (INDEPENDENT_AMBULATORY_CARE_PROVIDER_SITE_OTHER): Admitting: Family Medicine

## 2020-02-28 ENCOUNTER — Other Ambulatory Visit: Payer: Self-pay

## 2020-02-28 VITALS — BP 116/74 | HR 89 | Temp 99.8°F

## 2020-02-28 DIAGNOSIS — J208 Acute bronchitis due to other specified organisms: Secondary | ICD-10-CM | POA: Diagnosis not present

## 2020-02-28 DIAGNOSIS — B9689 Other specified bacterial agents as the cause of diseases classified elsewhere: Secondary | ICD-10-CM | POA: Diagnosis not present

## 2020-02-28 MED ORDER — BENZONATATE 100 MG PO CAPS: 100.0000 mg | ORAL_CAPSULE | Freq: Three times a day (TID) | ORAL | 0 refills | Status: DC | PRN

## 2020-02-28 MED ORDER — AZITHROMYCIN 250 MG PO TABS: ORAL_TABLET | ORAL | 0 refills | Status: DC

## 2020-02-28 NOTE — Progress Notes (Signed)
Chief Complaint  Patient presents with  . Cough    Fever     Amber [name] here for URI complaints. Due to COVID-19 pandemic, we are interacting via web portal for an electronic face-to-face visit. I verified patient's ID using 2 identifiers. Patient agreed to proceed with visit via this method. Patient is at home, I am at office. Patient and I are present for visit.   Duration: 9 days  Associated symptoms: Fever (101.6), shortness of breath and cough Denies: sinus congestion, sinus pain, rhinorrhea, itchy watery eyes, ear pain, ear drainage, sore throat, wheezing, myalgia and N/V/D Treatment to date: nebulized air, Nyquil, Dayquil Sick contacts: No  Past Medical History:  Diagnosis Date  . Anxiety   . Asthma   . Depression   . GERD (gastroesophageal reflux disease)   . History of chicken pox   . History of migraine headaches   . Pituitary adenoma ([place])   . Seizures ([facility])     BP 116/74 (BP Location: Left Arm, Patient Position: Sitting, Cuff Size: Normal)   Pulse 89   Temp 99.8 F (37.7 C) (Oral)   SpO2 93%  No conversational dyspnea Age appropriate judgment and insight Nml affect and mood  Acute bacterial bronchitis - Plan: benzonatate (TESSALON) 100 MG capsule, azithromycin (ZITHROMAX) 250 MG tablet  Given fevers >100.4 F, will tx. Neg flu and covid in ED. Tessalon perles first, Zpak if no better in 2 d.  Continue to push fluids, practice good hand hygiene, cover mouth when coughing. F/u prn. If starting to experience worsening s/s's, seek immediate care. Pt voiced understanding and agreement to the plan.  [place], DO 02/28/20 10:24 AM

## 2020-02-28 NOTE — Telephone Encounter (Signed)
Patient has video visit today 02/28/2020

## 2020-02-28 NOTE — Telephone Encounter (Signed)
Nurse Assessment Nurse: Malena Peer, RN, Edwena Felty Date/Time (Eastern Time): 02/27/2020 6:06:36 PM Confirm and document reason for call. If symptomatic, describe symptoms. ---Caller states she is still with a very bad cough, not improving 100.1 oral temp, and her O2 is 96%. Was tested for flu and COVID on 02/22/20 and both were negative Does the patient have any new or worsening symptoms? ---Yes Will a triage be completed? ---Yes Related visit to physician within the last 2 weeks? ---Yes Does the PT have any chronic conditions? (i.e. diabetes, asthma, this includes High risk factors for pregnancy, etc.) ---Yes List chronic conditions. ---Pituitary tumor Is the patient pregnant or possibly pregnant? (Ask all females between the ages of 25-55) ---No Is this a behavioral health or substance abuse call? ---No Guidelines Guideline Title Affirmed Question Affirmed Notes Nurse Date/Time (Eastern Time) Cough - Acute NonProductive [1] Continuous (nonstop) coughing interferes with work or school AND [2] no improvement using cough treatment per Care Advice Malena Peer, RN, Edwena Felty 02/27/2020 6:09:40 PM Disp. Time Eilene Ghazi Time) Disposition Final User PLEASE NOTE: All timestamps contained within this report are represented as [place] Standard Time. CONFIDENTIALTY NOTICE: This fax transmission is intended only for the addressee. It contains information that is legally privileged, confidential or otherwise protected from use or disclosure. If you are not the intended recipient, you are strictly prohibited from reviewing, disclosing, copying using or disseminating any of this information or taking any action in reliance on or regarding this information. If you have received this fax in error, please notify us immediately by telephone so that we can arrange for its return to [place]. Phone: [phone], Toll-Free: [phone], Fax: [phone] Page: 2 of 2 Call Id: [id number] 02/27/2020 6:15:16 PM See PCP within 24  Hours Yes Strick, RN, Osie Cheeks Disagree/Comply Comply Caller Understands Yes PreDisposition Go to Urgent Care/Walk-In Clinic Care Advice Given Per Guideline SEE PCP WITHIN 24 HOURS: * IF OFFICE WILL BE OPEN: You need to be examined within the next 24 hours. Call your doctor (or NP/PA) when the office opens and make an appointment. COUGH MEDICINES: * COUGH DROPS: Over-the-counter cough drops can help a lot, especially for mild coughs. They soothe an irritated throat and remove the tickle sensation in the back of the throat. Cough drops are easy to carry with you. * COUGH SYRUP WITH DEXTROMETHORPHAN: An over-the-counter cough syrup can help your cough. The most common cough suppressant in over-the-counter cough medicines is dextromethorphan. * HOME REMEDY - HARD CANDY: Hard candy works just as well as over-the-counter cough drops. People who have diabetes should use sugar-free candy. * HOME REMEDY - HONEY: This old home remedy has been shown to help decrease coughing at night. The adult dosage is 2 teaspoons (10 ml) at bedtime. Honey should not be given to infants under one year of age. COUGH SYRUP WITH DEXTROMETHORPHAN: HUMIDIFIER: * If the air is dry, use a humidifier in the bedroom. * Dry air makes coughs worse. COUGHING SPELLS: * Drink warm fluids. Inhale warm mist. This can help relax the airway and also loosen up phlegm. * Suck on cough drops or hard candy to coat the irritated throat. CALL BACK IF: * Difficulty breathing occurs * You become worse CARE ADVICE given per Cough - Acute Non-Productive (Adult) guideline. Referrals REFERRED TO PCP OFFICE

## 2020-03-01 ENCOUNTER — Emergency Department (HOSPITAL_BASED_OUTPATIENT_CLINIC_OR_DEPARTMENT_OTHER)
Admission: EM | Admit: 2020-03-01 | Discharge: 2020-03-02 | Disposition: A | Discharge status: Home / Self Care | Attending: Emergency Medicine | Admitting: Emergency Medicine

## 2020-03-01 ENCOUNTER — Emergency Department (HOSPITAL_BASED_OUTPATIENT_CLINIC_OR_DEPARTMENT_OTHER)

## 2020-03-01 ENCOUNTER — Encounter (HOSPITAL_BASED_OUTPATIENT_CLINIC_OR_DEPARTMENT_OTHER): Payer: Self-pay | Admitting: Emergency Medicine

## 2020-03-01 ENCOUNTER — Other Ambulatory Visit: Payer: Self-pay

## 2020-03-01 DIAGNOSIS — K219 Gastro-esophageal reflux disease without esophagitis: Secondary | ICD-10-CM | POA: Insufficient documentation

## 2020-03-01 DIAGNOSIS — Z87891 Personal history of nicotine dependence: Secondary | ICD-10-CM | POA: Diagnosis not present

## 2020-03-01 DIAGNOSIS — R7303 Prediabetes: Secondary | ICD-10-CM | POA: Diagnosis not present

## 2020-03-01 DIAGNOSIS — J209 Acute bronchitis, unspecified: Secondary | ICD-10-CM | POA: Diagnosis not present

## 2020-03-01 DIAGNOSIS — J45909 Unspecified asthma, uncomplicated: Secondary | ICD-10-CM | POA: Insufficient documentation

## 2020-03-01 DIAGNOSIS — J40 Bronchitis, not specified as acute or chronic: Secondary | ICD-10-CM

## 2020-03-01 DIAGNOSIS — R609 Edema, unspecified: Secondary | ICD-10-CM | POA: Diagnosis not present

## 2020-03-01 DIAGNOSIS — R059 Cough, unspecified: Secondary | ICD-10-CM | POA: Diagnosis present

## 2020-03-01 LAB — COMPREHENSIVE METABOLIC PANEL
ALT: 19 U/L (ref 0–44)
AST: 22 U/L (ref 15–41)
Albumin: 4.1 g/dL (ref 3.5–5.0)
Alkaline Phosphatase: 48 U/L (ref 38–126)
Anion gap: 10 (ref 5–15)
BUN: 14 mg/dL (ref 6–20)
CO2: 24 mmol/L (ref 22–32)
Calcium: 9.1 mg/dL (ref 8.9–10.3)
Chloride: 102 mmol/L (ref 98–111)
Creatinine, Ser: 0.89 mg/dL (ref 0.44–1.00)
GFR, Estimated: >60 mL/min (ref >60)
Glucose, Bld: 114 mg/dL — ABNORMAL HIGH (ref 70–99)
Potassium: 4 mmol/L (ref 3.5–5.1)
Sodium: 136 mmol/L (ref 135–145)
Total Bilirubin: 0.3 mg/dL (ref 0.3–1.2)
Total Protein: 7.6 g/dL (ref 6.5–8.1)

## 2020-03-01 LAB — URINALYSIS, ROUTINE W REFLEX MICROSCOPIC
Bilirubin Urine: NEGATIVE
Glucose, UA: NEGATIVE mg/dL
Hgb urine dipstick: NEGATIVE
Ketones, ur: NEGATIVE mg/dL
Leukocytes,Ua: NEGATIVE
Nitrite: NEGATIVE
Protein, ur: NEGATIVE mg/dL
Specific Gravity, Urine: >1.03 — ABNORMAL HIGH (ref 1.005–1.030)
pH: 5.5 (ref 5.0–8.0)

## 2020-03-01 LAB — CBC
Hemoglobin: 13.2 g/dL (ref 12.0–15.0)
Platelets: 435 10*3/uL — ABNORMAL HIGH (ref 150–400)
WBC: 9.8 10*3/uL (ref 4.0–10.5)

## 2020-03-01 LAB — LIPASE, BLOOD: Lipase: 29 U/L (ref 11–51)

## 2020-03-01 LAB — PREGNANCY, URINE: Preg Test, Ur: NEGATIVE

## 2020-03-01 MED ORDER — ALBUTEROL SULFATE HFA 108 (90 BASE) MCG/ACT IN AERS
2.0000 | INHALATION_SPRAY | Freq: Once | RESPIRATORY_TRACT | Status: AC
  Administered 2020-03-01: 2 via RESPIRATORY_TRACT
  Filled 2020-03-01: qty 6.7

## 2020-03-01 MED ORDER — ALUM & MAG HYDROXIDE-SIMETH 200-200-20 MG/5ML PO SUSP
30.0000 mL | Freq: Once | ORAL | Status: AC
  Administered 2020-03-01: 30 mL via ORAL
  Filled 2020-03-01: qty 30

## 2020-03-01 MED ORDER — DEXAMETHASONE SODIUM PHOSPHATE 10 MG/ML IJ SOLN
10.0000 mg | Freq: Once | INTRAMUSCULAR | Status: AC
  Administered 2020-03-02: 10 mg via INTRAVENOUS
  Filled 2020-03-01: qty 1

## 2020-03-01 NOTE — ED Notes (Signed)
Unable to provide urine at this time, provided specimen cup

## 2020-03-01 NOTE — ED Triage Notes (Signed)
Reports ongoing cough that causes headaches. Also reports abd pain, nausea, vomiting and diarrhea similar to previous hx of pancreatitis.

## 2020-03-02 ENCOUNTER — Telehealth: Payer: Self-pay

## 2020-03-02 NOTE — ED Provider Notes (Signed)
[facility] Provider Note   CSN: [id number] Arrival date & time: 03/01/20  2031     History Chief Complaint  Patient presents with  . Cough  . Abdominal Pain    Concerned for pancreatitis    Amber [name] is a 39 y.o. female.  HPI     This a 39 year old female with a history of asthma, reflux, migraines, pituitary adenoma who presents with ongoing cough and abdominal pain.  Patient reports that she is on day 11 of a nonproductive cough.  She states she has taken multiple medications with minimal relief.  She was prescribed a cough suppressant by her primary doctor.  She states early in onset of symptoms, she had fevers but has not had any fevers over 101 since Tuesday of last week.  She was prescribed azithromycin by her doctor but told to hold off unless fever recurs.  She has a history of asthma but has never had any recurrent issues and does not regularly use an albuterol inhaler.  She states that over the last 2 to 3 days she has had onset of upper abdominal discomfort.  She relates it closely to when she had pancreatitis and required gallbladder removal.  Pain is related to eating.  It is sharp and burning in nature.  It is currently 6 out of 10.  No nausea, vomiting, diarrhea.  No known sick contacts or Covid exposures.  She tested negative for COVID-19 1 week ago.  Past Medical History:  Diagnosis Date  . Anxiety   . Asthma   . Depression   . GERD (gastroesophageal reflux disease)   . History of chicken pox   . History of migraine headaches   . Pituitary adenoma ([place])   . Seizures [facility])     Patient Active Problem List   Diagnosis Date Noted  . Pre-diabetes 12/25/2019  . Infertility counseling 12/24/2019  . Well woman exam 12/24/2019  . Hyperglycemia 06/05/2019  . Insomnia 01/07/2019  . Bilateral hand pain 11/09/2018  . Agoraphobia 01/18/2018  . Anxiety with depression 03/24/2017  . Migraine 05/11/2016  . Pituitary adenoma  ([place]) 03/05/2016  . Hyperprolactinemia ([place]) 03/02/2016    Past Surgical History:  Procedure Laterality Date  . APPENDECTOMY    . CHOLECYSTECTOMY    . Pituiary Tumor     Tumor Removal Pituitary     OB History    Gravida  0   Para  0   Term  0   Preterm  0   AB  0   Living  0     SAB  0   IAB  0   Ectopic  0   Multiple  0   Live Births  0           Family History  Problem Relation Age of Onset  . Diabetes Mother   . Migraines Mother   . Hyperlipidemia Mother   . Bipolar disorder Mother   . Irritable bowel syndrome Mother   . Diabetes Father   . Colon cancer Father   . Cancer Father        colon  . Hyperlipidemia Father   . Hypertension Father   . Bipolar disorder Brother   . Migraines Sister   . Other Neg Hx        pituitary disorder  . Esophageal cancer Neg Hx     Social History   Tobacco Use  . Smoking status: Former Smoker    Packs/day: 1.00  Years: 15.00    Pack years: 15.00    Types: Cigarettes    Quit date: 01/24/2016    Years since quitting: 4.1  . Smokeless tobacco: Never Used  Vaping Use  . Vaping Use: Former  Substance Use Topics  . Alcohol use: No  . Drug use: No    Home Medications Prior to Admission medications   Medication Sig Start Date End Date Taking? Authorizing Provider  acetaminophen (TYLENOL) 500 MG tablet Take 500 mg by mouth as needed.    [provider]  azithromycin (ZITHROMAX) 250 MG tablet Take 2 tabs the first day and then 1 tab daily until you run out. 03/01/20   Shelda Pal, DO  benzonatate (TESSALON) 100 MG capsule Take 1 capsule (100 mg total) by mouth 3 (three) times daily as needed. 02/28/20   Shelda Pal, DO  cabergoline (DOSTINEX) 0.5 MG tablet Take 1 tablet (0.5 mg total) by mouth as directed. 1 mg twice a week and 0.5 mg once a week 08/29/19   Shamleffer, Melanie Crazier, MD  escitalopram (LEXAPRO) 10 MG tablet Take 1 tablet (10 mg total) by mouth daily. 01/10/20    Shelda Pal, DO  famotidine (PEPCID) 20 MG tablet Take 20 mg by mouth 2 (two) times daily.    [provider]  Galcanezumab-gnlm (EMGALITY) 120 MG/ML SOAJ Inject 120 mg into the skin every 28 (twenty-eight) days. 06/03/19   Tomi Likens, Adam R, DO  hydrOXYzine (ATARAX/VISTARIL) 25 MG tablet TAKE 1-3 TABLETS (25-75 MG TOTAL) BY MOUTH 3 (THREE) TIMES DAILY AS NEEDED FOR ANXIETY. 01/22/18   Shelda Pal, DO  metFORMIN (GLUCOPHAGE) 500 MG tablet Take 1 tablet (500 mg total) by mouth every morning. 06/24/19   Renato Shin, MD  ondansetron (ZOFRAN ODT) 8 MG disintegrating tablet Take 1 tablet (8 mg total) by mouth every 8 (eight) hours as needed for nausea or vomiting. 01/13/20   Tomi Likens, Adam R, DO  pantoprazole (PROTONIX) 40 MG tablet Take 1 tablet (40 mg total) by mouth 2 (two) times daily. Patient will need an appointment for further refills 01/13/20   Cirigliano, Vito V, DO  Prenatal Vit-Fe Fumarate-FA (PRENATAL VITAMIN) 27-0.8 MG TABS Take 1 tablet by mouth daily. 12/24/19   Seabron Spates, CNM  QUEtiapine (SEROQUEL) 100 MG tablet Take 1 tablet (100 mg total) by mouth at bedtime. 01/10/20   Shelda Pal, DO  Riboflavin 400 MG CAPS Take 400 mg by mouth daily.    [provider]  Rimegepant Sulfate (NURTEC) 75 MG TBDP Take 75 mg by mouth as needed (Daily as needed for a Migraine). Maximum 1 tablet in 24 hours.  Quantity 8. 09/17/19   Tomi Likens, Adam R, DO  topiramate (TOPAMAX) 100 MG tablet Take 1 tablet (100 mg total) by mouth 2 (two) times daily. 01/13/20   Pieter Partridge, DO  traMADol (ULTRAM) 50 MG tablet Take 1 tablet (50 mg total) by mouth every 6 (six) hours as needed. 01/13/20   Pieter Partridge, DO    Allergies    Aspirin, Chlorhexidine, Ibuprofen, Iodine, Lunesta [eszopiclone], Nsaids, Shellfish allergy, Sulfa antibiotics, and Tape  Review of Systems   Review of Systems  Constitutional: Negative for fever.  Respiratory: Positive for cough and shortness  of breath.   Cardiovascular: Negative for chest pain and leg swelling.  Gastrointestinal: Positive for abdominal pain. Negative for nausea and vomiting.  Genitourinary: Negative for dysuria.  All other systems reviewed and are negative.   Physical Exam Updated Vital  Signs BP 102/68 (BP Location: Right Arm)   Pulse 84   Temp 99.1 F (37.3 C) (Oral)   Resp 20   Wt 121.7 kg   LMP 02/22/2020   SpO2 98%   BMI 43.30 kg/m   Physical Exam Vitals and nursing note reviewed.  Constitutional:      Appearance: She is well-developed and well-nourished. She is obese. She is not ill-appearing.  HENT:     Head: Normocephalic and atraumatic.     Mouth/Throat:     Mouth: Mucous membranes are moist.  Eyes:     Pupils: Pupils are equal, round, and reactive to light.  Cardiovascular:     Rate and Rhythm: Normal rate and regular rhythm.     Heart sounds: Normal heart sounds.  Pulmonary:     Effort: Pulmonary effort is normal. No respiratory distress.     Breath sounds: Wheezing present.     Comments: Coughing noted on exam Abdominal:     General: Bowel sounds are normal.     Palpations: Abdomen is soft.     Tenderness: There is no guarding or rebound.  Musculoskeletal:     Cervical back: Neck supple.     Right lower leg: No edema.     Left lower leg: Edema present.  Skin:    General: Skin is warm and dry.  Neurological:     Mental Status: She is alert and oriented to person, place, and time.  Psychiatric:        Mood and Affect: Mood and affect and mood normal.     ED Results / Procedures / Treatments   Labs (all labs ordered are listed, but only abnormal results are displayed) Labs Reviewed  COMPREHENSIVE METABOLIC PANEL - Abnormal; Notable for the following components:      Result Value   Glucose, Bld 114 (*)    All other components within normal limits  CBC - Abnormal; Notable for the following components:   Platelets 435 (*)    All other components within normal limits   URINALYSIS, ROUTINE W REFLEX MICROSCOPIC - Abnormal; Notable for the following components:   Specific Gravity, Urine >1.030 (*)    All other components within normal limits  LIPASE, BLOOD  PREGNANCY, URINE    EKG None  Radiology DG Chest 2 View  Result Date: 03/01/2020 CLINICAL DATA:  Ongoing cough. Abdominal pain with nausea and vomiting. EXAM: CHEST - 2 VIEW COMPARISON:  Chest radiograph 02/22/2020 FINDINGS: Vague bibasilar opacities similar to prior, favoring atelectasis. The heart is normal in size with normal mediastinal contours. No pulmonary edema, pleural effusion, or pneumothorax. No acute osseous abnormalities are seen. IMPRESSION: Unchanged vague bibasilar opacities, favoring atelectasis. Electronically Signed   By: Keith Rake M.D.   On: 03/01/2020 23:43    Procedures Procedures (including critical care time)  Medications Ordered in ED Medications  alum & mag hydroxide-simeth (MAALOX/MYLANTA) 200-200-20 MG/5ML suspension 30 mL (30 mLs Oral Given 03/01/20 2346)  albuterol (VENTOLIN HFA) 108 (90 Base) MCG/ACT inhaler 2 puff (2 puffs Inhalation Given 03/01/20 2344)  dexamethasone (DECADRON) injection 10 mg (10 mg Intravenous Given 03/02/20 0000)    ED Course  I have reviewed the triage vital signs and the nursing notes.  Pertinent labs & imaging results that were available during my care of the patient were reviewed by me and considered in my medical decision making (see chart for details).    MDM Rules/Calculators/A&P  Patient presents with ongoing cough.  She is overall nontoxic and vital signs are reassuring.  She is also reporting abdominal pain.  She has some wheezing on exam.  History of smoking and asthma.  Patient was given an inhaler.  She has no significant tenderness of the abdomen on exam.  No rebound guarding or signs of peritonitis.  Considerations include gastritis, gastroenteritis, reflux, pancreatitis.  She is status post  cholecystectomy.  Chest x-ray obtained and shows no evidence of pneumothorax or pneumonia.  Basic lab work obtained.  Lipase is normal.  No significant metabolic derangements or LFT abnormalities.  No significant leukocytosis.  Patient was given a GI cocktail and an inhaler.  She was also given a dose of Decadron given her smoking and asthma history.  I discussed with her that I suspected that her symptoms were related to acute on chronic bronchitis and likely viral in nature.  She can expect her cough to potentially linger up to 4 to 6 weeks.  Use inhaler every 4 hours.  Patient had significant improvement after GI cocktail suggesting gastritis or gastroenteritis.  She takes Protonix at home.  Recommend diet modification for symptoms.  Patient stated understanding.  After history, exam, and medical workup I feel the patient has been appropriately medically screened and is safe for discharge home. Pertinent diagnoses were discussed with the patient. Patient was given return precautions.   Final Clinical Impression(s) / ED Diagnoses Final diagnoses:  Bronchitis  Gastroesophageal reflux disease, unspecified whether esophagitis present    Rx / DC Orders ED Discharge Orders    None       Merryl Hacker, MD 03/02/20 [phone]

## 2020-03-02 NOTE — Telephone Encounter (Signed)
Nurse Assessment Nurse: Hilary Hertz, RN, Louanne Belton Date/Time (Eastern Time): 03/01/2020 8:00:08 PM Confirm and document reason for call. If symptomatic, describe symptoms. ---Caller states diagnosed with URI last Saturday 12/4, symptoms started 12/2. Covid negative and flu negative. Reports cough is not getting better with cough meds, coughing more, vomiting, diarrhea, upper abd pain. Denies fever. Thinks she has pancreatitis, has history of it. Does the patient have any new or worsening symptoms? ---Yes Will a triage be completed? ---Yes Related visit to physician within the last 2 weeks? ---Yes Does the PT have any chronic conditions? (i.e. diabetes, asthma, this includes High risk factors for pregnancy, etc.) ---Yes List chronic conditions. ---pancreatitis, pituitary tumor Is the patient pregnant or possibly pregnant? (Ask all females between the ages of 56-55) ---No Is this a behavioral health or substance abuse call? ---No Guidelines Guideline Title Affirmed Question Affirmed Notes Nurse Date/Time Eilene Ghazi Time) Abdominal Pain - Upper [1] SEVERE pain (e.g., excruciating) AND [2] present > 1 hour Hilary Hertz, RN, Louanne Belton 03/01/2020 8:03:00 PM Disp. Time Eilene Ghazi Time) Disposition Final User 03/01/2020 8:05:49 PM Go to ED Now Yes Hilary Hertz, RN, Louanne Belton PLEASE NOTE: All timestamps contained within this report are represented as [place] Standard Time. CONFIDENTIALTY NOTICE: This fax transmission is intended only for the addressee. It contains information that is legally privileged, confidential or otherwise protected from use or disclosure. If you are not the intended recipient, you are strictly prohibited from reviewing, disclosing, copying using or disseminating any of this information or taking any action in reliance on or regarding this information. If you have received this fax in error, please notify us immediately by telephone so that we can arrange for  its return to [place]. Phone: [phone], Toll-Free: [phone], Fax: [phone] Page: 2 of 2 Call Id: [id number] [place] Disagree/Comply Comply Caller Understands Yes PreDisposition InappropriateToAsk Care Advice Given Per Guideline GO TO ED NOW: * You need to be seen in the Emergency Department. * Go to the ED at ___________ [place] now. Drive carefully. * Another adult should drive. DRIVING: NOTHING BY MOUTH: * Do not eat or drink anything for now. CARE ADVICE given per Abdominal Pain, Upper (Adult) guideline. * Bring a list of your current medicines when you go to the Emergency Department (ER). Comments User: Louanne Belton, Hilary Hertz, RN Date/Time Eilene Ghazi Time): 03/01/2020 8:04:11 PM caller rates pain 8/10 Referrals [facility] - ED  Pt seen in ED 03/01/20.

## 2020-03-02 NOTE — Discharge Instructions (Addendum)
You were seen today for ongoing cough.  This is likely acute on chronic bronchitis.  Use your inhaler every 4 hours as needed.  You were given a dose of steroids.  Your chest x-ray does not show any evidence of pneumonia at this time.  Be advised that the cough from bronchitis can linger for 4 to 6 weeks.  Continue your Protonix for reflux.

## 2020-03-02 NOTE — ED Notes (Signed)
Pt ambulated in the room while on the monitor. Pt O2 sat stayed at 96%. Pt seemed very winded while walking.

## 2020-03-03 ENCOUNTER — Other Ambulatory Visit: Payer: Self-pay | Admitting: Family Medicine

## 2020-03-04 ENCOUNTER — Other Ambulatory Visit: Payer: Self-pay

## 2020-03-04 ENCOUNTER — Telehealth (INDEPENDENT_AMBULATORY_CARE_PROVIDER_SITE_OTHER): Admitting: Family Medicine

## 2020-03-04 ENCOUNTER — Encounter: Payer: Self-pay | Admitting: Family Medicine

## 2020-03-04 VITALS — BP 100/76 | HR 88 | Temp 98.7°F | Ht 66.0 in | Wt 264.0 lb

## 2020-03-04 DIAGNOSIS — J40 Bronchitis, not specified as acute or chronic: Secondary | ICD-10-CM

## 2020-03-04 NOTE — Progress Notes (Signed)
Chief Complaint  Patient presents with   Cough    Subjective: Patient is a 39 y.o. female here for f/u uri. Due to COVID-19 pandemic, we are interacting via web portal for an electronic face-to-face visit. I verified patient's ID using 2 identifiers. Patient agreed to proceed with visit via this method. Patient is at home, I am at office. Patient and I are present for visit.   Pt dealing with continued URI s/s's. She gets light headed when she stands up or coughs. She is compliant with medications. No fevers or sob. She is monitoring O2 at home, drops down to 87 sometimes, but quickly comes up. Has been running at 90-92%. She is wondering if she needs O2. She has been doing with a GI illness also.   Past Medical History:  Diagnosis Date   Anxiety    Asthma    Depression    GERD (gastroesophageal reflux disease)    History of chicken pox    History of migraine headaches    Pituitary adenoma ([facility])    Seizures ([facility])     Objective: BP 100/76 (BP Location: Left Arm, Patient Position: Sitting, Cuff Size: Normal)    Pulse 88    Temp 98.7 F (37.1 C) (Oral)    Ht 5\' 6"  (1.676 m)    Wt 264 lb (119.7 kg)    LMP 02/22/2020    SpO2 92%    BMI 42.61 kg/m  No conversational dyspnea Age appropriate judgment and insight Nml affect and mood  Assessment and Plan: Bronchitis  Cont care. Explained this could take up to 3 weeks to resolve. I don't think she needs O2.  Fu prn.  The patient voiced understanding and agreement to the plan.  [place], DO 03/04/20  2:37 PM

## 2020-03-10 MED ORDER — PROMETHAZINE-DM 6.25-15 MG/5ML PO SYRP: 5.0000 mL | ORAL_SOLUTION | Freq: Four times a day (QID) | ORAL | 0 refills | Status: DC | PRN

## 2020-03-12 ENCOUNTER — Telehealth: Payer: Self-pay

## 2020-03-12 NOTE — Telephone Encounter (Signed)
Called pharmacy and they stated they found another manufacturer and patient has picked up original script.

## 2020-03-12 NOTE — Telephone Encounter (Signed)
Regular promethazine is fine.  Thank you.

## 2020-03-12 NOTE — Telephone Encounter (Signed)
Pharmacy called stating Promethazine DM is out of stock at their pharmacy and  and they only have promethazine with codeine or just regular promethazine as an alternative.

## 2020-04-06 NOTE — Progress Notes (Signed)
-  Virtual Visit via Video Note The purpose of this virtual visit is to provide medical care while limiting exposure to the novel coronavirus.    Consent was obtained for video visit:  Yes Answered questions that patient had about telehealth interaction:  Yes  I discussed the limitations, risks, security and privacy concerns of performing an evaluation and management service by telemedicine. I also discussed with the patient that there may be a patient responsible charge related to this service. The patient expressed understanding and agreed to proceed.  Pt location: Home Physician Location: office Name of referring provider:  Shelda Pal* I connected with [name] at patients initiation/request on 04/07/2020 at  2:30 PM EST by video enabled telemedicine application and verified that I am speaking with the correct person using two identifiers. 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  UPDATE: She has had a moderate level migraine with left sided weakness for the past 3 days.  Not responding to Nurtec.  It is close to her next Emgality injection.  She reports increased stress as a trigger to this migraine.  Typically, they occur:  Intensity:moderate-severe Duration:1 hour with Nurtec Frequency: 8 a month (within the weeks before and after injection) Frequency of abortive: 2 to 3 days a week Rescue protocol:Zofran, Tylenol(with tramadol if severe) Current NSAIDS:no Current analgesics:Tylenol; tramadol Current triptans:no Current ergot: Dostinex (for hyperprolactinemia) Current anti-emetic:Zofran 8mg  Current muscle relaxants:no Current anti-anxiolytic:hydroxyzine Current sleep aide:quetiapine 50mg  Current  Antihypertensive medications:no Current Antidepressant medications:Lexapro 10mg  Current Anticonvulsant medications:topiramate100mg  twice daily Current CGRP inhibitor:  Emgality, Nurtec (rescue) Current Vitamins/Herbal/Supplements:magnesium oxide 400mg , riboflavin 400mg  Current Antihistamines/Decongestants: Benadryl Other therapy:no  Caffeine:1 cup coffeerarely Alcohol:no Smoker:no Diet:60 oz water daily. Little fast food. No soda. Eats chicken and fish.Not skipping meals. Exercise:no Depression/anxiety:yes Other pain: On 10/01/2019, she woke up with left sided neck pain radiating down the left arm.  Tramadol and tizanidine was ineffective.  She did not recall straining her neck.  She went to the ED where she was diagnosed with trapezius muscle strain and cervical radiculopathy.  CT cervical spine personally reviewed showed minor degenerative disc disease at C6-C7 but no spinal or neural foraminal stenosis.  She is currently on a prednisone taper.   Sleep hygiene:Improved with quetiapine  Prolactin level still elevated, however MRI of pituitary with and without contrast from 08/31/2019 personally reviewed showed regression of the microadenoma since 2018.  Workup is ongoing.  She is undergoing cardiac evaluation as well.  HISTORY: Migraines Onset: Childhood Location:Left sided Quality:throbbing Initial intensity:severe Aura:no Prodrome:Feels ill for 3 days prior Postdrome:"hangover" effect for 1 day after Associated symptoms:Left sided numbnessof face, arm and leg with slight weakness of left arm and leg. Nausea, photophobia, and phonophobia. She has not had any new worse headache of her life, waking up from sleep InitialDuration:Several hours to several days. Over the summer, she had status migrainosis lasting 13-14 weeks. InitialFrequency:3 to 4 days a week InitialFrequency of abortive medication:3 to 4 days a  week Triggers/aggravating factors: Sleep deprivation, shifting positions Relieving factors: Laying down in dark and quiet room Activity:Aggravates.  Past NSAIDS:Ibuprofen. Cannot take NSAIDs due to GI bleed Past analgesics:Tramadol (decreases intensity), Excedrin Past abortive triptans/ergot:Sumatriptan (increased headache) Past muscle relaxants:no Past anti-emetic: Reglan(contraindicated with cabergoline), Zofran 4mg , Promethazine Past antihypertensive medications:Propranolol 80mg  (hypotension) Past antidepressant medications:Prozac;venlafaxine XR150mg  daily  Past anticonvulsant medications:no Past CGRP inhibitors:  Roselyn Meier Past vitamins/Herbal/Supplements:no Past antihistamines/decongestants:no Other past therapies:no Unable to afford Botox with her insurance plan.  Cluster Headache: Right orbital, stabbing, severe, associated with ptosis and conjunctival injection, lasting several hours and occurring 1 to 2 times a month.  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  Past Medical History: Past Medical History:  Diagnosis Date  . Anxiety   . Asthma   . Depression   . GERD (gastroesophageal reflux disease)   . History of chicken pox   . History of migraine headaches   . Pituitary adenoma ([place])   . Seizures ([place])     Medications: Outpatient Encounter Medications as of 04/07/2020  Medication Sig  . acetaminophen (TYLENOL) 500 MG tablet Take 500 mg by mouth as needed.  Marland Kitchen azithromycin (ZITHROMAX) 250 MG tablet Take 2 tabs the first day and then 1 tab daily until you run out.  . benzonatate (TESSALON) 100 MG capsule Take 1 capsule (100 mg total) by mouth 3 (three) times daily as needed.  . cabergoline (DOSTINEX) 0.5 MG tablet Take 1 tablet (0.5 mg total) by mouth as directed. 1 mg  twice a week and 0.5 mg once a week  . escitalopram (LEXAPRO) 10 MG tablet Take 1 tablet (10 mg total) by mouth daily.  . famotidine (PEPCID) 20 MG tablet Take 20 mg by mouth 2 (two) times daily.  . Galcanezumab-gnlm (EMGALITY) 120 MG/ML SOAJ Inject 120 mg into the skin every 28 (twenty-eight) days.  . hydrOXYzine (ATARAX/VISTARIL) 25 MG tablet TAKE 1-3 TABLETS (25-75 MG TOTAL) BY MOUTH 3 (THREE) TIMES DAILY AS NEEDED FOR ANXIETY.  . metFORMIN (GLUCOPHAGE) 500 MG tablet Take 1 tablet (500 mg total) by mouth every morning.  . ondansetron (ZOFRAN ODT) 8 MG disintegrating tablet Take 1 tablet (8 mg total) by mouth every 8 (eight) hours as needed for nausea or vomiting.  . pantoprazole (PROTONIX) 40 MG tablet Take 1 tablet (40 mg total) by mouth 2 (two) times daily. Patient will need an appointment for further refills  . Prenatal Vit-Fe Fumarate-FA (PRENATAL VITAMIN) 27-0.8 MG TABS Take 1 tablet by mouth daily.  . promethazine-dextromethorphan (PROMETHAZINE-DM) 6.25-15 MG/5ML syrup Take 5 mLs by mouth 4 (four) times daily as needed.  Marland Kitchen QUEtiapine (SEROQUEL) 100 MG tablet Take 1 tablet (100 mg total) by mouth at bedtime.  . Riboflavin 400 MG CAPS Take 400 mg by mouth daily.  . Rimegepant Sulfate (NURTEC) 75 MG TBDP Take 75 mg by mouth as needed (Daily as needed for a Migraine). Maximum 1 tablet in 24 hours.  Quantity 8.  . topiramate (TOPAMAX) 100 MG tablet Take 1 tablet (100 mg total) by mouth 2 (two) times daily.  . traMADol (ULTRAM) 50 MG tablet Take 1 tablet (50 mg total) by mouth every 6 (six) hours as needed.   No facility-administered encounter medications on file as of 04/07/2020.    Allergies: Allergies  Allergen Reactions  . Aspirin   . Chlorhexidine   . Ibuprofen   . Iodine   . Lunesta [Eszopiclone] Other (See Comments)    GI issues  . Nsaids Nausea And Vomiting  . Shellfish Allergy     Tingle in mouth/throat  . Sulfa Antibiotics   . Tape Rash    Blisters     Family  History: Family History  Problem Relation Age of  Onset  . Diabetes Mother   . Migraines Mother   . Hyperlipidemia Mother   . Bipolar disorder Mother   . Irritable bowel syndrome Mother   . Diabetes Father   . Colon cancer Father   . Cancer Father        colon  . Hyperlipidemia Father   . Hypertension Father   . Bipolar disorder Brother   . Migraines Sister   . Other Neg Hx        pituitary disorder  . Esophageal cancer Neg Hx     Social History: Social History   Socioeconomic History  . Marital status: Married    Spouse name: Jenny Reichmann  . Number of children: 0  . Years of education: Not on file  . Highest education level: 12th grade  Occupational History  . Occupation: unemployed  Tobacco Use  . Smoking status: Former Smoker    Packs/day: 1.00    Years: 15.00    Pack years: 15.00    Types: Cigarettes    Quit date: 01/24/2016    Years since quitting: 4.2  . Smokeless tobacco: Never Used  Vaping Use  . Vaping Use: Former  Substance and Sexual Activity  . Alcohol use: No  . Drug use: No  . Sexual activity: Yes    Birth control/protection: None  Other Topics Concern  . Not on file  Social History Narrative   Patient is right-handed. She lives with her husband in a 2 level home. She drinks I cup of coffee a day. She walks daily, and rides her recumbent bike QOD.   Social Determinants of Health   Financial Resource Strain: Not on file  Food Insecurity: Not on file  Transportation Needs: Not on file  Physical Activity: Not on file  Stress: Not on file  Social Connections: Not on file  Intimate Partner Violence: Not on file    Observations/Objective:   There were no vitals taken for this visit. No acute distress.  Alert and oriented.  Speech fluent and not dysarthric.  Language intact.    Assessment and Plan:   1.  Hemiplegic migraine, intractable 2.  Pituitary microadenoma   1.  To break current intractable migraine, will prescribe prednisone taper. 2.   Since her migraines are congregated the week before and after her injection, will have her move up Emgality dosing to every 21 days. 3.  Nurtec for rescue.  Zofran for nausea. 4.  Limit use of pain relievers to no more than 2 days out of week to prevent risk of rebound or medication-overuse headache. 5.  Keep headache diary 6.  Follow up in 6 months.  Follow Up Instructions:    -I discussed the assessment and treatment plan with the patient. The patient was provided an opportunity to ask questions and all were answered. The patient agreed with the plan and demonstrated an understanding of the instructions.   The patient was advised to call back or seek an in-person evaluation if the symptoms worsen or if the condition fails to improve as anticipated.     Dudley Major, DO

## 2020-04-07 ENCOUNTER — Encounter: Payer: Self-pay | Admitting: Neurology

## 2020-04-07 ENCOUNTER — Telehealth (INDEPENDENT_AMBULATORY_CARE_PROVIDER_SITE_OTHER): Admitting: Neurology

## 2020-04-07 DIAGNOSIS — G43419 Hemiplegic migraine, intractable, without status migrainosus: Secondary | ICD-10-CM | POA: Diagnosis not present

## 2020-04-07 DIAGNOSIS — D352 Benign neoplasm of pituitary gland: Secondary | ICD-10-CM

## 2020-04-07 MED ORDER — EMGALITY 120 MG/ML ~~LOC~~ SOAJ: 120.0000 mg | SUBCUTANEOUS | 5 refills | Status: DC

## 2020-04-07 MED ORDER — PREDNISONE 10 MG PO TABS: ORAL_TABLET | ORAL | 0 refills | Status: DC

## 2020-04-07 MED ORDER — NURTEC 75 MG PO TBDP: 75.0000 mg | ORAL_TABLET | ORAL | 5 refills | Status: DC | PRN

## 2020-04-08 ENCOUNTER — Encounter (HOSPITAL_BASED_OUTPATIENT_CLINIC_OR_DEPARTMENT_OTHER): Payer: Self-pay | Admitting: *Deleted

## 2020-04-08 ENCOUNTER — Other Ambulatory Visit: Payer: Self-pay

## 2020-04-08 ENCOUNTER — Emergency Department (HOSPITAL_BASED_OUTPATIENT_CLINIC_OR_DEPARTMENT_OTHER)
Admission: EM | Admit: 2020-04-08 | Discharge: 2020-04-09 | Disposition: A | Discharge status: Home / Self Care | Attending: Emergency Medicine | Admitting: Emergency Medicine

## 2020-04-08 ENCOUNTER — Emergency Department (HOSPITAL_BASED_OUTPATIENT_CLINIC_OR_DEPARTMENT_OTHER)

## 2020-04-08 DIAGNOSIS — Z87891 Personal history of nicotine dependence: Secondary | ICD-10-CM | POA: Diagnosis not present

## 2020-04-08 DIAGNOSIS — S2243XA Multiple fractures of ribs, bilateral, initial encounter for closed fracture: Secondary | ICD-10-CM | POA: Insufficient documentation

## 2020-04-08 DIAGNOSIS — R7303 Prediabetes: Secondary | ICD-10-CM | POA: Insufficient documentation

## 2020-04-08 DIAGNOSIS — J45909 Unspecified asthma, uncomplicated: Secondary | ICD-10-CM | POA: Insufficient documentation

## 2020-04-08 DIAGNOSIS — W19XXXA Unspecified fall, initial encounter: Secondary | ICD-10-CM | POA: Insufficient documentation

## 2020-04-08 DIAGNOSIS — Z7984 Long term (current) use of oral hypoglycemic drugs: Secondary | ICD-10-CM | POA: Diagnosis not present

## 2020-04-08 DIAGNOSIS — S299XXA Unspecified injury of thorax, initial encounter: Secondary | ICD-10-CM | POA: Diagnosis present

## 2020-04-08 MED ORDER — HYDROCODONE-ACETAMINOPHEN 5-325 MG PO TABS
2.0000 | ORAL_TABLET | Freq: Once | ORAL | Status: AC
Start: 2020-04-08 — End: 2020-04-08
  Administered 2020-04-08: 2 via ORAL
  Filled 2020-04-08: qty 2

## 2020-04-08 NOTE — ED Notes (Signed)
Pt refuses urine pregnancy test  States there is absolutely no way she is pregnant

## 2020-04-08 NOTE — ED Triage Notes (Signed)
C/o fall x 1 day ago , c/o lower back pain

## 2020-04-08 NOTE — ED Provider Notes (Signed)
[facility] EMERGENCY DEPARTMENT Provider Note   CSN: [id number] Arrival date & time: 04/08/20  2048     History Chief Complaint  Patient presents with  . Fall    Amber [name] is a 40 y.o. female.  Pt reports she fell yesterday on the ice and hit her right ribs and her head.  Pt complains of pain with a deep breath and with movement.  Pt points to right side as area of pain  The history is provided by the patient. No language interpreter was used.  Fall This is a new problem. The current episode started yesterday. The problem occurs constantly. The problem has been gradually worsening. Nothing aggravates the symptoms. Nothing relieves the symptoms. She has tried nothing for the symptoms. The treatment provided no relief.       Past Medical History:  Diagnosis Date  . Anxiety   . Asthma   . Depression   . GERD (gastroesophageal reflux disease)   . History of chicken pox   . History of migraine headaches   . Pituitary adenoma ([place])   . Seizures [facility])     Patient Active Problem List   Diagnosis Date Noted  . Pre-diabetes 12/25/2019  . Infertility counseling 12/24/2019  . Well woman exam 12/24/2019  . Hyperglycemia 06/05/2019  . Insomnia 01/07/2019  . Bilateral hand pain 11/09/2018  . Agoraphobia 01/18/2018  . Anxiety with depression 03/24/2017  . Migraine 05/11/2016  . Pituitary adenoma ([place]) 03/05/2016  . Hyperprolactinemia ([place]) 03/02/2016    Past Surgical History:  Procedure Laterality Date  . APPENDECTOMY    . CHOLECYSTECTOMY    . Pituiary Tumor     Tumor Removal Pituitary     OB History    Gravida  0   Para  0   Term  0   Preterm  0   AB  0   Living  0     SAB  0   IAB  0   Ectopic  0   Multiple  0   Live Births  0           Family History  Problem Relation Age of Onset  . Diabetes Mother   . Migraines Mother   . Hyperlipidemia Mother   . Bipolar disorder Mother   . Irritable bowel syndrome Mother   .  Diabetes Father   . Colon cancer Father   . Cancer Father        colon  . Hyperlipidemia Father   . Hypertension Father   . Bipolar disorder Brother   . Migraines Sister   . Other Neg Hx        pituitary disorder  . Esophageal cancer Neg Hx     Social History   Tobacco Use  . Smoking status: Former Smoker    Packs/day: 1.00    Years: 15.00    Pack years: 15.00    Types: Cigarettes    Quit date: 01/24/2016    Years since quitting: 4.2  . Smokeless tobacco: Never Used  Vaping Use  . Vaping Use: Former  Substance Use Topics  . Alcohol use: No  . Drug use: No    Home Medications Prior to Admission medications   Medication Sig Start Date End Date Taking? Authorizing Provider  acetaminophen (TYLENOL) 500 MG tablet Take 500 mg by mouth as needed.    [provider]  azithromycin (ZITHROMAX) 250 MG tablet Take 2 tabs the first day and then 1 tab daily until  you run out. Patient not taking: Reported on 04/07/2020 03/01/20   Shelda Pal, DO  cabergoline (DOSTINEX) 0.5 MG tablet Take 1 tablet (0.5 mg total) by mouth as directed. 1 mg twice a week and 0.5 mg once a week 08/29/19   Shamleffer, Melanie Crazier, MD  escitalopram (LEXAPRO) 10 MG tablet Take 1 tablet (10 mg total) by mouth daily. 01/10/20   Shelda Pal, DO  famotidine (PEPCID) 20 MG tablet Take 20 mg by mouth 2 (two) times daily.    [provider]  Galcanezumab-gnlm (EMGALITY) 120 MG/ML SOAJ Inject 120 mg into the skin every 28 (twenty-eight) days. 04/07/20   Tomi Likens, Adam R, DO  hydrOXYzine (ATARAX/VISTARIL) 25 MG tablet TAKE 1-3 TABLETS (25-75 MG TOTAL) BY MOUTH 3 (THREE) TIMES DAILY AS NEEDED FOR ANXIETY. 01/22/18   Shelda Pal, DO  metFORMIN (GLUCOPHAGE) 500 MG tablet Take 1 tablet (500 mg total) by mouth every morning. 06/24/19   Renato Shin, MD  ondansetron (ZOFRAN ODT) 8 MG disintegrating tablet Take 1 tablet (8 mg total) by mouth every 8 (eight) hours as needed for  nausea or vomiting. 01/13/20   Tomi Likens, Adam R, DO  pantoprazole (PROTONIX) 40 MG tablet Take 1 tablet (40 mg total) by mouth 2 (two) times daily. Patient will need an appointment for further refills 01/13/20   Cirigliano, Vito V, DO  predniSONE (DELTASONE) 10 MG tablet Take 60mg  on day 1, then 50mg  on day 2, then 40mg  on day 3, then 30mg  on day 4, then 20mg  on day 5, then 10mg  on day 6, then STOP 04/07/20   Jaffe, Adam R, DO  Prenatal Vit-Fe Fumarate-FA (PRENATAL VITAMIN) 27-0.8 MG TABS Take 1 tablet by mouth daily. 12/24/19   Seabron Spates, CNM  promethazine-dextromethorphan (PROMETHAZINE-DM) 6.25-15 MG/5ML syrup Take 5 mLs by mouth 4 (four) times daily as needed. Patient not taking: Reported on 04/07/2020 03/10/20   Shelda Pal, DO  QUEtiapine (SEROQUEL) 100 MG tablet Take 1 tablet (100 mg total) by mouth at bedtime. 01/10/20   Shelda Pal, DO  Riboflavin 400 MG CAPS Take 400 mg by mouth daily.    [provider]  Rimegepant Sulfate (NURTEC) 75 MG TBDP Take 75 mg by mouth as needed (Daily as needed for a Migraine). Maximum 1 tablet in 24 hours.  Quantity 8. 04/07/20   Tomi Likens, Adam R, DO  topiramate (TOPAMAX) 100 MG tablet Take 1 tablet (100 mg total) by mouth 2 (two) times daily. 01/13/20   Pieter Partridge, DO  traMADol (ULTRAM) 50 MG tablet Take 1 tablet (50 mg total) by mouth every 6 (six) hours as needed. 01/13/20   Pieter Partridge, DO    Allergies    Aspirin, Chlorhexidine, Ibuprofen, Iodine, Lunesta [eszopiclone], Nsaids, Shellfish allergy, Sulfa antibiotics, and Tape  Review of Systems   Review of Systems  Physical Exam Updated Vital Signs BP 103/68 (BP Location: Right Arm)   Pulse 88   Temp 98.3 F (36.8 C)   Resp 18   Ht 5\' 6"  (1.676 m)   Wt 117.9 kg   LMP 03/25/2020   SpO2 99%   BMI 41.97 kg/m   Physical Exam  ED Results / Procedures / Treatments   Labs (all labs ordered are listed, but only abnormal results are displayed) Labs Reviewed   PREGNANCY, URINE    EKG None  Radiology DG Ribs Unilateral W/Chest Right  Result Date: 04/08/2020 CLINICAL DATA:  Fall 1 day ago with right-sided chest pain, initial encounter  EXAM: RIGHT RIBS AND CHEST - 3+ VIEW COMPARISON:  03/01/2020 FINDINGS: Cardiac shadows within normal limits. The lungs are well aerated bilaterally. No focal infiltrate or sizable effusion is seen. Minimally displaced fractures of the right eighth and tenth ribs are seen. No pneumothorax is noted. No other focal abnormality is noted. IMPRESSION: Fractures of the right eighth and tenth ribs without complicating factors. Electronically Signed   By: Inez Catalina M.D.   On: 04/08/2020 22:59    Procedures Procedures (including critical care time)  Medications Ordered in ED Medications  HYDROcodone-acetaminophen (NORCO/VICODIN) 5-325 MG per tablet 2 tablet (has no administration in time range)    ED Course  I have reviewed the triage vital signs and the nursing notes.  Pertinent labs & imaging results that were available during my care of the patient were reviewed by me and considered in my medical decision making (see chart for details).    MDM Rules/Calculators/A&P                          MDM;  Pt counseled on rib fractures.  Pt given rx for pain medications  Final Clinical Impression(s) / ED Diagnoses Final diagnoses:  Fall, initial encounter  Closed fracture of multiple ribs of both sides, initial encounter    Rx / DC Orders ED Discharge Orders    None    An After Visit Summary was printed and given to the patient.    Fransico Meadow, PA-C 04/09/20 0020    Lennice Sites, DO 04/09/20 [phone]

## 2020-04-09 MED ORDER — HYDROCODONE-ACETAMINOPHEN 5-325 MG PO TABS: 1.0000 | ORAL_TABLET | ORAL | 0 refills | Status: DC | PRN

## 2020-04-09 NOTE — Discharge Instructions (Signed)
Return if any problems.

## 2020-04-09 NOTE — Telephone Encounter (Signed)
Called pt to schedule an appointment

## 2020-04-10 ENCOUNTER — Encounter: Payer: Self-pay | Admitting: Family Medicine

## 2020-04-10 ENCOUNTER — Ambulatory Visit (INDEPENDENT_AMBULATORY_CARE_PROVIDER_SITE_OTHER): Admitting: Family Medicine

## 2020-04-10 ENCOUNTER — Other Ambulatory Visit: Payer: Self-pay

## 2020-04-10 VITALS — BP 128/80 | HR 70 | Temp 98.3°F | Ht 66.0 in | Wt 276.1 lb

## 2020-04-10 DIAGNOSIS — S2241XA Multiple fractures of ribs, right side, initial encounter for closed fracture: Secondary | ICD-10-CM

## 2020-04-10 DIAGNOSIS — S060X0A Concussion without loss of consciousness, initial encounter: Secondary | ICD-10-CM

## 2020-04-10 MED ORDER — PREDNISONE 20 MG PO TABS
40.0000 mg | ORAL_TABLET | Freq: Every day | ORAL | 0 refills | Status: AC
Start: 2020-04-10 — End: 2020-04-15

## 2020-04-10 MED ORDER — OXYCODONE HCL 10 MG PO TABS: 5.0000 mg | ORAL_TABLET | Freq: Three times a day (TID) | ORAL | 0 refills | Status: DC | PRN

## 2020-04-10 NOTE — Progress Notes (Signed)
Chief Complaint  Patient presents with   Follow-up   Foot Problem    Right foot     Subjective: Patient is a 40 y.o. female here for rib pain.  Fell 3 d ago and broke R ribs. Rx'd Norco w no relief. Has not been taking Tylenol as it is part of the Norco. Trying to take deep breaths. No sob. Having a HA still.   Past Medical History:  Diagnosis Date   Anxiety    Asthma    Depression    GERD (gastroesophageal reflux disease)    History of chicken pox    History of migraine headaches    Pituitary adenoma ([facility])    Seizures ([facility])     Objective: BP 128/80 (BP Location: Left Arm, Patient Position: Sitting, Cuff Size: Large)    Pulse 70    Temp 98.3 F (36.8 C) (Oral)    Ht 5\' 6"  (1.676 m)    Wt 276 lb 2 oz (125.2 kg)    LMP 03/25/2020    SpO2 97%    BMI 44.57 kg/m  General: Awake, appears stated age HEENT: MMM, EOMi Heart: RRR, no murmurs Lungs: CTAB, no rales, wheezes or rhonchi. No accessory muscle use Psych: Age appropriate judgment and insight, normal affect and mood  Assessment and Plan: Closed fracture of multiple ribs of right side, initial encounter - Plan: Oxycodone HCl 10 MG TABS, predniSONE (DELTASONE) 20 MG tablet  Concussion without loss of consciousness, initial encounter  1. Pred burst to help w inflammation. Tylenol prn. Oxy for breakthrough pain. Ice/heat.  2. OTC supps.  F/u prn.  The patient voiced understanding and agreement to the plan.  [place], DO 04/10/20  2:37 PM

## 2020-04-10 NOTE — Patient Instructions (Addendum)
Do not drink alcohol, do any illicit/street drugs, drive or do anything that requires alertness while on this medicine.   Ice/cold pack over area for 10-15 min twice daily.  Heat (pad or rice pillow in microwave) over affected area, 10-15 minutes twice daily.   OK to take Tylenol 1000 mg (2 extra strength tabs) or 975 mg (3 regular strength tabs) every 6 hours as needed.  Fish oil 3 grams daily for 10 days then 2 grams daily  Vitamin D 4000 IU daily  CoQ10 200mg  daily for headaches  Tart cherry extract any dose at night  To help improve COGNITIVE function: Using fish oil/omega 3 that is 1000 mg (or roughly 600 mg EPA/DHA), starting as soon as possible after concussion, take: 3 tabs THREE TIMES a day  for the first 3 days, then (you will smell a little, sorry) 3 tabs TWICE DAILY  for the next 3 days, then 3 tabs ONCE DAILY  for the next 10 days    To help reduce HEADACHES: Coenzyme Q10 160mg  ONCE DAILY Riboflavin/Vitamin B2 400mg  ONCE DAILY Magnesium oxide 400 mg ONE-TWO TIMES DAILY May stop after headaches are resolved.                                                                                               To help with INSOMNIA: Melatonin 3-5mg  AT BEDTIME Tart cherry extract, any dose at night    Other medicines to help decrease inflammation Alpha Lipoic Acid 100mg  TWICE DAILY Turmeric 500mg  twice daily Iron 65mg  elemental daily Vitamin D 4000 IU daily for 2 weeks then 2000 IU daily thereafter.  Let us know if you need anything.

## 2020-04-14 DIAGNOSIS — S2241XA Multiple fractures of ribs, right side, initial encounter for closed fracture: Secondary | ICD-10-CM

## 2020-04-15 ENCOUNTER — Other Ambulatory Visit: Payer: Self-pay | Admitting: Family Medicine

## 2020-04-15 DIAGNOSIS — S2241XA Multiple fractures of ribs, right side, initial encounter for closed fracture: Secondary | ICD-10-CM

## 2020-04-15 MED ORDER — OXYCODONE HCL 10 MG PO TABS
5.0000 mg | ORAL_TABLET | Freq: Three times a day (TID) | ORAL | 0 refills | Status: DC | PRN
Start: 2020-04-15 — End: 2020-11-10

## 2020-04-15 NOTE — Telephone Encounter (Signed)
Medication:  Oxycodone HCl 10 MG TABS [[id number]]   Has the patient contacted their pharmacy? No. (If no, request that the patient contact the pharmacy for the refill.) (If yes, when and what did the pharmacy advise?)  Preferred Pharmacy (with phone number or street name): [facility] [address] - [place], [place] - [address] [place] [address]  [address], [address], [place] [address]  Phone:  [phone] Fax:  [phone]  DEA #:  --  Agent: Please be advised that RX refills may take up to 3 business days. We ask that you follow-up with your pharmacy.

## 2020-04-15 NOTE — Progress Notes (Signed)
re

## 2020-04-28 ENCOUNTER — Encounter: Payer: Self-pay | Admitting: Internal Medicine

## 2020-04-28 ENCOUNTER — Ambulatory Visit (INDEPENDENT_AMBULATORY_CARE_PROVIDER_SITE_OTHER): Admitting: Internal Medicine

## 2020-04-28 ENCOUNTER — Other Ambulatory Visit: Payer: Self-pay

## 2020-04-28 VITALS — BP 124/82 | HR 78 | Ht 66.0 in | Wt 268.5 lb

## 2020-04-28 DIAGNOSIS — R739 Hyperglycemia, unspecified: Secondary | ICD-10-CM

## 2020-04-28 DIAGNOSIS — D352 Benign neoplasm of pituitary gland: Secondary | ICD-10-CM

## 2020-04-28 DIAGNOSIS — R7303 Prediabetes: Secondary | ICD-10-CM | POA: Diagnosis not present

## 2020-04-28 LAB — HEMOGLOBIN A1C: Hgb A1c MFr Bld: 5.9 % (ref 4.6–6.5)

## 2020-04-28 LAB — BASIC METABOLIC PANEL
BUN: 11 mg/dL (ref 6–23)
CO2: 26 mEq/L (ref 19–32)
Calcium: 9.4 mg/dL (ref 8.4–10.5)
Chloride: 107 mEq/L (ref 96–112)
Creatinine, Ser: 1.04 mg/dL (ref 0.40–1.20)
GFR: 67.34 mL/min (ref >60.00)
Glucose, Bld: 84 mg/dL (ref 70–99)
Potassium: 4.1 mEq/L (ref 3.5–5.1)
Sodium: 139 mEq/L (ref 135–145)

## 2020-04-28 LAB — T4, FREE: Free T4: 0.76 ng/dL (ref 0.60–1.60)

## 2020-04-28 LAB — TSH: TSH: 0.58 u[IU]/mL (ref 0.35–4.50)

## 2020-04-28 NOTE — Patient Instructions (Addendum)
-   Continue Cabergoline 0.5 mg, 2 tablets on Mondays, 1 tablet on Wednesdays and 2 tablets on Fridays  - Metformin 500 mg daily

## 2020-04-28 NOTE — Progress Notes (Signed)
Name: Amber [name]  MRN/ DOB: [id number], January 23, 1981    Age/ Sex: 40 y.o., female     PCP: Shelda Pal, DO   Reason for Endocrinology Evaluation: Hyperprolactinemia      Initial Endocrinology Clinic Visit: 03/02/2016    PATIENT IDENTIFIER: [name] is a 40 y.o., female with a past medical history of Prolactinoma, GERD, Asthma and migraine headaches . She has followed with [facility] since 03/02/2016 for consultative assistance with management of her hyperprolactinemia .   HISTORICAL SUMMARY: The patient was first diagnosed with hyperprolactinemia in 1996 during evaluation for primary amenorrhea ( highest reading 198 ). She is S/P resection of prolactinoma in 2009 [place], [place] due to size 3 cm, per pt she had a sphenoidal sinus penetration. Per pt prolactin levels normalized and menses resumed.  Pt was noted with elevated prolactin levels again 2015 and was started on Cabergoline, imaging have been reported at " no tumor"     Cabergoline was started 2019 Bromocriptine was added to cabergoline 05/2019 by another endocrinologist  On her initial visit with me in 08/2019, we stopped the Bromocriptine and increased cabergoline. Since the dose exceeded 2 mg/ week , we proceed with an echocardiogram , which showed normal valve structure but there was a question about a retro aortic coronary artery. CT angio was normal.     Repeat pituitary MRI in 08/2019 showed regression of pituitary gland     NO FH of pituitary disease  SUBJECTIVE:    Today (04/28/2020):  [name] is here for a follow up on hyperprolactinemia   Has regular menstruation , LMP 03/30/2020 Denies nipple discharge  No vision changes   She continues with headaches - follows with Dr. Loretta Plume , uses [facility]   She is on Cabergoline 0.5 mg, 2.5 mg a week   Two tabs on Mondays, 1 tablet on wednesday and 2 tabs on Friday   She is interested in conceiving in the near  future    HISTORY:  Past Medical History:  Past Medical History:  Diagnosis Date  . Anxiety   . Asthma   . Depression   . GERD (gastroesophageal reflux disease)   . History of chicken pox   . History of migraine headaches   . Pituitary adenoma ([place])   . Seizures ([place])    Past Surgical History:  Past Surgical History:  Procedure Laterality Date  . APPENDECTOMY    . CHOLECYSTECTOMY    . Pituiary Tumor     Tumor Removal Pituitary   Social History:  reports that she quit smoking about 4 years ago. Her smoking use included cigarettes. She has a 15.00 pack-year smoking history. She has never used smokeless tobacco. She reports that she does not drink alcohol and does not use drugs. Family History:  Family History  Problem Relation Age of Onset  . Diabetes Mother   . Migraines Mother   . Hyperlipidemia Mother   . Bipolar disorder Mother   . Irritable bowel syndrome Mother   . Diabetes Father   . Colon cancer Father   . Cancer Father        colon  . Hyperlipidemia Father   . Hypertension Father   . Bipolar disorder Brother   . Migraines Sister   . Other Neg Hx        pituitary disorder  . Esophageal cancer Neg Hx      HOME MEDICATIONS: Allergies as of 04/28/2020      Reactions  Aspirin    Chlorhexidine    Ibuprofen    Iodine    Lunesta [eszopiclone] Other (See Comments)   GI issues   Nsaids Nausea And Vomiting   Shellfish Allergy    Tingle in mouth/throat   Sulfa Antibiotics    Tape Rash   Blisters      Medication List       Accurate as of April 28, 2020  1:19 PM. If you have any questions, ask your nurse or doctor.        acetaminophen 500 MG tablet Commonly known as: TYLENOL Take 500 mg by mouth as needed.   cabergoline 0.5 MG tablet Commonly known as: DOSTINEX Take 1 tablet (0.5 mg total) by mouth as directed. 1 mg twice a week and 0.5 mg once a week   Emgality 120 MG/ML Soaj Generic drug: Galcanezumab-gnlm Inject 120 mg into the skin every  28 (twenty-eight) days.   escitalopram 10 MG tablet Commonly known as: LEXAPRO Take 1 tablet (10 mg total) by mouth daily.   famotidine 20 MG tablet Commonly known as: PEPCID Take 20 mg by mouth 2 (two) times daily.   hydrOXYzine 25 MG tablet Commonly known as: ATARAX/VISTARIL TAKE 1-3 TABLETS (25-75 MG TOTAL) BY MOUTH 3 (THREE) TIMES DAILY AS NEEDED FOR ANXIETY.   metFORMIN 500 MG tablet Commonly known as: GLUCOPHAGE Take 1 tablet (500 mg total) by mouth every morning.   Nurtec 75 MG Tbdp Generic drug: Rimegepant Sulfate Take 75 mg by mouth as needed (Daily as needed for a Migraine). Maximum 1 tablet in 24 hours.  Quantity 8.   ondansetron 8 MG disintegrating tablet Commonly known as: Zofran ODT Take 1 tablet (8 mg total) by mouth every 8 (eight) hours as needed for nausea or vomiting.   Oxycodone HCl 10 MG Tabs Take 0.5-1 tablets (5-10 mg total) by mouth every 8 (eight) hours as needed (Pain).   pantoprazole 40 MG tablet Commonly known as: PROTONIX Take 1 tablet (40 mg total) by mouth 2 (two) times daily. Patient will need an appointment for further refills   predniSONE 10 MG tablet Commonly known as: DELTASONE Take 60mg  on day 1, then 50mg  on day 2, then 40mg  on day 3, then 30mg  on day 4, then 20mg  on day 5, then 10mg  on day 6, then STOP   Prenatal Vitamin 27-0.8 MG Tabs Take 1 tablet by mouth daily.   QUEtiapine 100 MG tablet Commonly known as: SEROQUEL Take 1 tablet (100 mg total) by mouth at bedtime.   Riboflavin 400 MG Caps Take 400 mg by mouth daily.   topiramate 100 MG tablet Commonly known as: TOPAMAX Take 1 tablet (100 mg total) by mouth 2 (two) times daily.         OBJECTIVE:   PHYSICAL EXAM: VS: BP 124/82   Pulse 78   Ht 5\' 6"  (1.676 m)   Wt 268 lb 8 oz (121.8 kg)   SpO2 97%   BMI 43.34 kg/m    EXAM: General: Pt appears well and is in NAD  Neck: General: Supple without adenopathy. Thyroid: Thyroid size normal.  No goiter or nodules  appreciated.  Lungs: Clear with good BS bilat with no rales, rhonchi, or wheezes  Heart: Auscultation: RRR.  Abdomen: Normoactive bowel sounds, soft, nontender, without masses or organomegaly palpable  Extremities:  BL LE: No pretibial edema normal ROM and strength.  Mental Status: Judgment, insight: Intact Orientation: Oriented to time, place, and person Mood and affect: No depression, anxiety, or  agitation     DATA REVIEWED:  Results for TALYA, QUAIN" (MRN [id number]) as of 04/29/2020 07:38  Ref. Range 04/28/2020 13:32  Sodium Latest Ref Range: 135 - 145 mEq/L 139  Potassium Latest Ref Range: 3.5 - 5.1 mEq/L 4.1  Chloride Latest Ref Range: 96 - 112 mEq/L 107  CO2 Latest Ref Range: 19 - 32 mEq/L 26  Glucose Latest Ref Range: 70 - 99 mg/dL 84  BUN Latest Ref Range: 6 - 23 mg/dL 11  Creatinine Latest Ref Range: 0.40 - 1.20 mg/dL 1.04  Calcium Latest Ref Range: 8.4 - 10.5 mg/dL 9.4  GFR Latest Ref Range: >60.00 mL/min 67.34  Prolactin Latest Units: ng/mL 25.0  Hemoglobin A1C Latest Ref Range: 4.6 - 6.5 % 5.9  TSH Latest Ref Range: 0.35 - 4.50 uIU/mL 0.58  T4,Free(Direct) Latest Ref Range: 0.60 - 1.60 ng/dL 0.76    MRI 08/31/2019  Dedicated pituitary imaging. Gland size appears slightly smaller now compared to 2018 (series 16, image 9 today versus series 17, image 6 previously). No suprasellar mass or mass effect. Infundibulum and hypothalamus appear normal.  Regressed hypoenhancing area in the left inferior and lateral aspect of the gland. Today there is only subtle heterogeneous enhancement on that side (series 7, image 10) encompassing 3-4 mm. The cavernous sinuses remain normal. Normal clivus. No other abnormal enhancement.  IMPRESSION: 1. Regression of pituitary microadenoma since 2018, subtle residual heterogeneity in the left aspect of the gland encompassing 3-4 mm.  2. No new intracranial abnormality. Stable mild nonspecific cerebral white matter signal  changes. ASSESSMENT / PLAN / RECOMMENDATIONS:   Hx of Pituitary Macroadenoma , S/P Transphenoidal surgery in 2009:  - Pr pt she had a transphenoidal surgery for a 3 cm pituitary adenoma in 2009 - Her last MRI was in 08/2019 indicating regression  - Labs have been normal to include TSH, ACTH , cortisol and IGF-1 (08/2019)    Hyperprolactinemia :  - Diagnosed in 1996 - Normalization of levels following transphenoidal pituitary resection in 2009 , restarted on cabergoline in 2015 and again in 2019.  - In March 2021, bromocriptine was added to her regimen by previouse endocrinologist, this was discontinued in 08/2019 and increased cabergoline.  - Pt will need echocardiogram in 2023  - Repeat prolactin continues to be normal  - Pt on Tramadol for breakthrough headaches and on recent opiate use for rib fracture, I have cautioned with opiates as they increase release of prolactin  - Pt understands to stop Cabergoline with positive pregnancy test and to notify our office     Medications   Continue cabergoline 0.5 mg ,a total of  2.5 mg weekly (Two tabs on Mondays, 1 tab on wednesdays and 2 tabs on Fridays)   2. Pre-Diabetes :  - Her A1c has been trending up, will increase Metformin as below  - A1c if 5.9 %, up from 5.4%     Increase Metformin 500 mg  To Twice daily     F/U in 4 months      Signed electronically by: Mack Guise, MD  [facility]  [address] [place], [place] [address] Phone: [phone] FAX: [phone]      CC: Shelda Pal, [address] [place] [address] Phone: [phone]  Fax: [phone]   Return to Endocrinology clinic as below: Future Appointments  Date Time Provider [place]  04/30/2020  2:30 PM Rosemarie Ax, MD [facility]  10/15/2020  3:30 PM Pieter Partridge, DO [facility] None

## 2020-04-29 LAB — PROLACTIN: Prolactin: 25 ng/mL

## 2020-04-29 MED ORDER — METFORMIN HCL 500 MG PO TABS: 500.0000 mg | ORAL_TABLET | Freq: Two times a day (BID) | ORAL | 3 refills | Status: DC

## 2020-04-30 ENCOUNTER — Ambulatory Visit: Admitting: Family Medicine

## 2020-05-07 ENCOUNTER — Other Ambulatory Visit: Payer: Self-pay

## 2020-05-07 ENCOUNTER — Ambulatory Visit (INDEPENDENT_AMBULATORY_CARE_PROVIDER_SITE_OTHER): Admitting: Family Medicine

## 2020-05-07 VITALS — BP 102/78 | Ht 66.0 in | Wt 268.0 lb

## 2020-05-07 DIAGNOSIS — R200 Anesthesia of skin: Secondary | ICD-10-CM

## 2020-05-07 DIAGNOSIS — S2241XA Multiple fractures of ribs, right side, initial encounter for closed fracture: Secondary | ICD-10-CM

## 2020-05-07 DIAGNOSIS — M7918 Myalgia, other site: Secondary | ICD-10-CM

## 2020-05-07 MED ORDER — LIDOCAINE 5 % EX PTCH: 1.0000 | MEDICATED_PATCH | Freq: Two times a day (BID) | CUTANEOUS | 2 refills | Status: DC

## 2020-05-07 NOTE — Assessment & Plan Note (Signed)
Having altered sensation of the fourth and fifth digit.  Has good motor function.  No signs that are concerning on exam.  Likely contusion of the nerve. -Counseled on home exercise therapy and supportive care.

## 2020-05-07 NOTE — Assessment & Plan Note (Signed)
Having pain around the scapula that is related to her fall.  Has multiple trigger points in this area.  CT scan was reassuring. -Counseled on home exercise therapy and supportive care. -Could consider physical therapy.

## 2020-05-07 NOTE — Patient Instructions (Signed)
Nice to meet you Please try the patches  Please try exercises  Please try the rib belt.   Please send me a message in MyChart with any questions or updates.  Please see me back in 4 weeks.   --Dr. Raeford Razor

## 2020-05-07 NOTE — Progress Notes (Signed)
Amber [name] - 40 y.o. female MRN [id number]  Date of birth: [birth year]  SUBJECTIVE:  Including CC & ROS.  No chief complaint on file.   Amber [name] is a 40 y.o. female that is presenting with periscapular pain, right rib pain, right toe numbness.  Her symptoms started after she fell taking out the trash.  She landed on her back.  This occurred about a month ago.  Pain is ongoing.  No history of back surgery.  No history of similar pain.  No shortness of breath..  Independent review of the right rib x-ray from 1/19 shows multiple rib fractures.   Review of Systems See HPI   HISTORY: Past Medical, Surgical, Social, and Family History Reviewed & Updated per EMR.   Pertinent Historical Findings include:  Past Medical History:  Diagnosis Date  . Anxiety   . Asthma   . Depression   . GERD (gastroesophageal reflux disease)   . History of chicken pox   . History of migraine headaches   . Pituitary adenoma ([place])   . Seizures ([place])     Past Surgical History:  Procedure Laterality Date  . APPENDECTOMY    . CHOLECYSTECTOMY    . Pituiary Tumor     Tumor Removal Pituitary    Family History  Problem Relation Age of Onset  . Diabetes Mother   . Migraines Mother   . Hyperlipidemia Mother   . Bipolar disorder Mother   . Irritable bowel syndrome Mother   . Diabetes Father   . Colon cancer Father   . Cancer Father        colon  . Hyperlipidemia Father   . Hypertension Father   . Bipolar disorder Brother   . Migraines Sister   . Other Neg Hx        pituitary disorder  . Esophageal cancer Neg Hx     Social History   Socioeconomic History  . Marital status: Married    Spouse name: Jenny Reichmann  . Number of children: 0  . Years of education: Not on file  . Highest education level: 12th grade  Occupational History  . Occupation: unemployed  Tobacco Use  . Smoking status: Former Smoker    Packs/day: 1.00    Years: 15.00    Pack years: 15.00    Types: Cigarettes     Quit date: 01/24/2016    Years since quitting: 4.2  . Smokeless tobacco: Never Used  Vaping Use  . Vaping Use: Former  Substance and Sexual Activity  . Alcohol use: No  . Drug use: No  . Sexual activity: Yes    Birth control/protection: None  Other Topics Concern  . Not on file  Social History Narrative   Patient is right-handed. She lives with her husband in a 2 level home. She drinks I cup of coffee a day. She walks daily, and rides her recumbent bike QOD.   Social Determinants of Health   Financial Resource Strain: Not on file  Food Insecurity: Not on file  Transportation Needs: Not on file  Physical Activity: Not on file  Stress: Not on file  Social Connections: Not on file  Intimate Partner Violence: Not on file     PHYSICAL EXAM:  VS: BP 102/78 (BP Location: Left Arm, Patient Position: Sitting, Cuff Size: Large)   Ht 5\' 6"  (1.676 m)   Wt 268 lb (121.6 kg)   BMI 43.26 kg/m  Physical Exam Gen: NAD, alert, cooperative with exam, well-appearing MSK:  Back: Tenderness  palpation that is periscapular in nature. Normal shoulder range of motion. Normal strength resistance. Right foot: Normal flexion extension of the toes. No signs of atrophy. No swelling or ecchymosis. Neurovascular intact     ASSESSMENT & PLAN:   Myofascial pain Having pain around the scapula that is related to her fall.  Has multiple trigger points in this area.  CT scan was reassuring. -Counseled on home exercise therapy and supportive care. -Could consider physical therapy.  Numbness of toes Having altered sensation of the fourth and fifth digit.  Has good motor function.  No signs that are concerning on exam.  Likely contusion of the nerve. -Counseled on home exercise therapy and supportive care.   Multiple closed fractures of ribs of right side Injury occurred on 1/18.  She had a fall on her back while walking on the ice.  No suggestion of pneumothorax today. -Counseled on home  exercise therapy and supportive care. -Rib belt. -Lidoderm patches. -Could consider nerve block or referral to physical therapy.

## 2020-05-07 NOTE — Assessment & Plan Note (Signed)
Injury occurred on 1/18.  She had a fall on her back while walking on the ice.  No suggestion of pneumothorax today. -Counseled on home exercise therapy and supportive care. -Rib belt. -Lidoderm patches. -Could consider nerve block or referral to physical therapy.

## 2020-05-22 ENCOUNTER — Encounter: Payer: Self-pay | Admitting: Family Medicine

## 2020-05-25 ENCOUNTER — Other Ambulatory Visit: Payer: Self-pay | Admitting: Family Medicine

## 2020-05-25 DIAGNOSIS — R200 Anesthesia of skin: Secondary | ICD-10-CM

## 2020-05-25 DIAGNOSIS — M7918 Myalgia, other site: Secondary | ICD-10-CM

## 2020-05-25 DIAGNOSIS — S2241XA Multiple fractures of ribs, right side, initial encounter for closed fracture: Secondary | ICD-10-CM

## 2020-05-25 NOTE — Progress Notes (Signed)
Referral to physical therapy for ongoing pain.   Rosemarie Ax, MD [facility] 05/25/2020, 7:54 AM

## 2020-06-04 ENCOUNTER — Ambulatory Visit: Admitting: Family Medicine

## 2020-06-16 ENCOUNTER — Ambulatory Visit: Attending: Family Medicine | Admitting: Physical Therapy

## 2020-07-17 ENCOUNTER — Other Ambulatory Visit: Payer: Self-pay | Admitting: Family Medicine

## 2020-07-17 MED ORDER — QUETIAPINE FUMARATE 300 MG PO TABS: 300.0000 mg | ORAL_TABLET | Freq: Every day | ORAL | 0 refills | Status: DC

## 2020-07-17 MED ORDER — PANTOPRAZOLE SODIUM 40 MG PO TBEC: 40.0000 mg | DELAYED_RELEASE_TABLET | Freq: Two times a day (BID) | ORAL | 1 refills | Status: DC

## 2020-09-01 ENCOUNTER — Ambulatory Visit (INDEPENDENT_AMBULATORY_CARE_PROVIDER_SITE_OTHER): Admitting: Internal Medicine

## 2020-09-01 ENCOUNTER — Other Ambulatory Visit: Payer: Self-pay

## 2020-09-01 ENCOUNTER — Encounter: Payer: Self-pay | Admitting: Internal Medicine

## 2020-09-01 VITALS — BP 110/74 | HR 79 | Ht 66.0 in | Wt 266.0 lb

## 2020-09-01 DIAGNOSIS — R739 Hyperglycemia, unspecified: Secondary | ICD-10-CM

## 2020-09-01 DIAGNOSIS — E221 Hyperprolactinemia: Secondary | ICD-10-CM | POA: Diagnosis not present

## 2020-09-01 DIAGNOSIS — R7303 Prediabetes: Secondary | ICD-10-CM | POA: Diagnosis not present

## 2020-09-01 DIAGNOSIS — D352 Benign neoplasm of pituitary gland: Secondary | ICD-10-CM | POA: Diagnosis not present

## 2020-09-01 LAB — COMPREHENSIVE METABOLIC PANEL WITH GFR
ALT: 11 U/L (ref 0–35)
AST: 12 U/L (ref 0–37)
Albumin: 4.3 g/dL (ref 3.5–5.2)
Alkaline Phosphatase: 53 U/L (ref 39–117)
BUN: 11 mg/dL (ref 6–23)
CO2: 25 meq/L (ref 19–32)
Calcium: 9.4 mg/dL (ref 8.4–10.5)
Chloride: 107 meq/L (ref 96–112)
Creatinine, Ser: 0.95 mg/dL (ref 0.40–1.20)
GFR: 74.88 mL/min
Glucose, Bld: 76 mg/dL (ref 70–99)
Potassium: 4.2 meq/L (ref 3.5–5.1)
Sodium: 140 meq/L (ref 135–145)
Total Bilirubin: 0.3 mg/dL (ref 0.2–1.2)
Total Protein: 6.9 g/dL (ref 6.0–8.3)

## 2020-09-01 LAB — PROLACTIN: Prolactin: 19.8 ng/mL

## 2020-09-01 LAB — HEMOGLOBIN A1C: Hgb A1c MFr Bld: 5.5 % (ref 4.6–6.5)

## 2020-09-01 NOTE — Progress Notes (Signed)
Name: Amber [name]  MRN/ DOB: [id number], [birth year]    Age/ Sex: 40 y.o., female     PCP: Shelda Pal, DO   Reason for Endocrinology Evaluation: Hyperprolactinemia      Initial Endocrinology Clinic Visit: 03/02/2016    PATIENT IDENTIFIER: Amber [name] is a 40 y.o., female with a past medical history of Prolactinoma, GERD, Asthma and migraine headaches . She has followed with [facility] since 03/02/2016 for consultative assistance with management of her hyperprolactinemia .   HISTORICAL SUMMARY: The patient was first diagnosed with hyperprolactinemia in 1996 during evaluation for primary amenorrhea ( highest reading 198 ). She is S/P resection of prolactinoma in 2009 [place], [place] due to size 3 cm, per pt she had a sphenoidal sinus penetration. Per pt prolactin levels normalized and menses resumed.  Pt was noted with elevated prolactin levels again 2015 and was started on Cabergoline, imaging have been reported at " no tumor"     Cabergoline was started 2019 Bromocriptine was added to cabergoline 05/2019 by another endocrinologist  On her initial visit with me in 08/2019, we stopped the Bromocriptine and increased cabergoline. Since the dose exceeded 2 mg/ week , we proceed with an echocardiogram , which showed normal valve structure but there was a question about a retro aortic coronary artery. CT angio was normal.     Repeat pituitary MRI in 08/2019 showed regression of pituitary gland     NO FH of pituitary disease  SUBJECTIVE:    Today (09/01/2020):  Ms. Slight is here for a follow up on hyperprolactinemia   Has regular menstruation , LMP 08/25/2020 Denies nipple discharge  No vision changes   She continues with migraine headaches , stable - follows with Dr. Loretta Plume , uses Emgality   She is on Cabergoline 0.5 mg, 2.5 mg a week   Two tabs on Mondays, 1 tablet on wednesday and 2 tabs on Friday   She is interested in conceiving  in the near future  Denies vomiting but has loose stools/diarrhea that she attributes to hx cholecystectomy   HISTORY:  Past Medical History:  Past Medical History:  Diagnosis Date   Anxiety    Asthma    Depression    GERD (gastroesophageal reflux disease)    History of chicken pox    History of migraine headaches    Pituitary adenoma ([place])    Seizures ([place])    Past Surgical History:  Past Surgical History:  Procedure Laterality Date   APPENDECTOMY     CHOLECYSTECTOMY     Pituiary Tumor     Tumor Removal Pituitary   Social History:  reports that she quit smoking about 4 years ago. Her smoking use included cigarettes. She has a 15.00 pack-year smoking history. She has never used smokeless tobacco. She reports that she does not drink alcohol and does not use drugs. Family History:  Family History  Problem Relation Age of Onset   Diabetes Mother    Migraines Mother    Hyperlipidemia Mother    Bipolar disorder Mother    Irritable bowel syndrome Mother    Diabetes Father    Colon cancer Father    Cancer Father        colon   Hyperlipidemia Father    Hypertension Father    Bipolar disorder Brother    Migraines Sister    Other Neg Hx        pituitary disorder   Esophageal cancer Neg Hx  HOME MEDICATIONS: Allergies as of 09/01/2020       Reactions   Aspirin    Chlorhexidine    Ibuprofen    Iodine    Lunesta [eszopiclone] Other (See Comments)   GI issues   Nsaids Nausea And Vomiting   Shellfish Allergy    Tingle in mouth/throat   Sulfa Antibiotics    Tape Rash   Blisters        Medication List        Accurate as of September 01, 2020  1:10 PM. If you have any questions, ask your nurse or doctor.          acetaminophen 500 MG tablet Commonly known as: TYLENOL Take 500 mg by mouth as needed.   cabergoline 0.5 MG tablet Commonly known as: DOSTINEX Take 1 tablet (0.5 mg total) by mouth as directed. 1 mg twice a week and 0.5 mg once a week    Emgality 120 MG/ML Soaj Generic drug: Galcanezumab-gnlm Inject 120 mg into the skin every 28 (twenty-eight) days.   escitalopram 10 MG tablet Commonly known as: LEXAPRO Take 1 tablet (10 mg total) by mouth daily.   famotidine 20 MG tablet Commonly known as: PEPCID Take 20 mg by mouth 2 (two) times daily.   hydrOXYzine 25 MG tablet Commonly known as: ATARAX/VISTARIL TAKE 1-3 TABLETS (25-75 MG TOTAL) BY MOUTH 3 (THREE) TIMES DAILY AS NEEDED FOR ANXIETY.   lidocaine 5 % Commonly known as: Lidoderm Place 1 patch onto the skin every 12 (twelve) hours. Remove & Discard patch within 12 hours or as directed by MD   metFORMIN 500 MG tablet Commonly known as: GLUCOPHAGE Take 1 tablet (500 mg total) by mouth 2 (two) times daily with a meal.   Nurtec 75 MG Tbdp Generic drug: Rimegepant Sulfate Take 75 mg by mouth as needed (Daily as needed for a Migraine). Maximum 1 tablet in 24 hours.  Quantity 8.   ondansetron 8 MG disintegrating tablet Commonly known as: Zofran ODT Take 1 tablet (8 mg total) by mouth every 8 (eight) hours as needed for nausea or vomiting.   Oxycodone HCl 10 MG Tabs Take 0.5-1 tablets (5-10 mg total) by mouth every 8 (eight) hours as needed (Pain).   pantoprazole 40 MG tablet Commonly known as: PROTONIX Take 1 tablet (40 mg total) by mouth 2 (two) times daily.   predniSONE 10 MG tablet Commonly known as: DELTASONE Take 60mg  on day 1, then 50mg  on day 2, then 40mg  on day 3, then 30mg  on day 4, then 20mg  on day 5, then 10mg  on day 6, then STOP   Prenatal Vitamin 27-0.8 MG Tabs Take 1 tablet by mouth daily.   QUEtiapine 300 MG tablet Commonly known as: SEROQUEL Take 1 tablet (300 mg total) by mouth at bedtime.   Riboflavin 400 MG Caps Take 400 mg by mouth daily.   topiramate 100 MG tablet Commonly known as: TOPAMAX Take 1 tablet (100 mg total) by mouth 2 (two) times daily.          OBJECTIVE:   PHYSICAL EXAM: VS: BP 110/74   Pulse 79   Ht 5'  6" (1.676 m)   Wt 266 lb (120.7 kg)   SpO2 98%   BMI 42.93 kg/m    EXAM: General: Pt appears well and is in NAD  Neck: General: Supple without adenopathy. Thyroid: Thyroid size normal.  No goiter or nodules appreciated.  Lungs: Clear with good BS bilat with no rales, rhonchi, or wheezes  Heart: Auscultation: RRR.  Abdomen: Normoactive bowel sounds, soft, nontender, without masses or organomegaly palpable  Extremities:  BL LE: No pretibial edema normal ROM and strength.  Mental Status: Judgment, insight: Intact Orientation: Oriented to time, place, and person Mood and affect: No depression, anxiety, or agitation     DATA REVIEWED:  Results for IRAM, ASTORINO (MRN [id number]) as of 09/03/2020 07:23  Ref. Range 09/01/2020 13:19  Sodium Latest Ref Range: 135 - 145 mEq/L 140  Potassium Latest Ref Range: 3.5 - 5.1 mEq/L 4.2  Chloride Latest Ref Range: 96 - 112 mEq/L 107  CO2 Latest Ref Range: 19 - 32 mEq/L 25  Glucose Latest Ref Range: 70 - 99 mg/dL 76  BUN Latest Ref Range: 6 - 23 mg/dL 11  Creatinine Latest Ref Range: 0.40 - 1.20 mg/dL 0.95  Calcium Latest Ref Range: 8.4 - 10.5 mg/dL 9.4  Alkaline Phosphatase Latest Ref Range: 39 - 117 U/L 53  Albumin Latest Ref Range: 3.5 - 5.2 g/dL 4.3  AST Latest Ref Range: 0 - 37 U/L 12  ALT Latest Ref Range: 0 - 35 U/L 11  Total Protein Latest Ref Range: 6.0 - 8.3 g/dL 6.9  Total Bilirubin Latest Ref Range: 0.2 - 1.2 mg/dL 0.3  GFR Latest Ref Range: >60.00 mL/min 74.88  Prolactin Latest Units: ng/mL 19.8  Hemoglobin A1C Latest Ref Range: 4.6 - 6.5 % 5.5      MRI 08/31/2019  Dedicated pituitary imaging. Gland size appears slightly smaller now compared to 2018 (series 16, image 9 today versus series 17, image 6 previously). No suprasellar mass or mass effect. Infundibulum and hypothalamus appear normal.   Regressed hypoenhancing area in the left inferior and lateral aspect of the gland. Today there is only subtle  heterogeneous enhancement on that side (series 7, image 10) encompassing 3-4 mm. The cavernous sinuses remain normal. Normal clivus. No other abnormal enhancement.   IMPRESSION: 1. Regression of pituitary microadenoma since 2018, subtle residual heterogeneity in the left aspect of the gland encompassing 3-4 mm.   2. No new intracranial abnormality. Stable mild nonspecific cerebral white matter signal changes. ASSESSMENT / PLAN / RECOMMENDATIONS:   Hx of Pituitary Macroadenoma , S/P Transphenoidal surgery in 2009:  - Pr pt she had a transphenoidal surgery for a 3 cm pituitary adenoma in 2009 - Her last MRI was in 08/2019 indicating regression  - Labs have been normal to include TSH, ACTH , cortisol and IGF-1 (08/2019)    Hyperprolactinemia :  - Diagnosed in 1996 - Normalization of levels following transphenoidal pituitary resection in 2009 , restarted on cabergoline in 2015 and again in 2019.  - In March 2021, bromocriptine was added to her regimen by previouse endocrinologist, this was discontinued in 08/2019 and increased cabergoline.  - Pt will need echocardiogram in 2023  - Repeat prolactin continues to be normal  - Pt understands to stop Cabergoline with positive pregnancy test and to notify our office     Medications   Continue cabergoline 0.5 mg ,a total of  2.5 mg weekly (Two tabs on Mondays, 1 tab on wednesdays and 2 tabs on Fridays)   2. Pre-Diabetes :  - Tolerating increase dose in metformin  A1c trending down     Continue Metformin 500 mg Twice daily     F/U in 6 months      Signed electronically by: Mack Guise, MD  [facility]  [facility] [address], [place], [place] [address] Phone:  [phone] FAX: [phone]      CC: [place], [place] [address] [place] [address] Phone: [phone]  Fax: [phone]   Return to Endocrinology clinic as below: Future  Appointments  Date Time Provider [place]  10/15/2020  3:30 PM Pieter Partridge, DO [facility] None

## 2020-09-01 NOTE — Patient Instructions (Signed)
-   Continue Cabergoline 0.5 mg, 2 tablets on Mondays, 1 tablet on Wednesdays and 2 tablets on Fridays  - Metformin 500 mg , Twice daily

## 2020-09-02 ENCOUNTER — Other Ambulatory Visit: Payer: Self-pay

## 2020-09-02 DIAGNOSIS — F418 Other specified anxiety disorders: Secondary | ICD-10-CM

## 2020-09-02 MED ORDER — ONDANSETRON 8 MG PO TBDP: 8.0000 mg | ORAL_TABLET | Freq: Three times a day (TID) | ORAL | 0 refills | Status: DC | PRN

## 2020-09-02 MED ORDER — NURTEC 75 MG PO TBDP: 75.0000 mg | ORAL_TABLET | ORAL | 0 refills | Status: DC | PRN

## 2020-09-02 MED ORDER — TOPIRAMATE 100 MG PO TABS: 100.0000 mg | ORAL_TABLET | Freq: Two times a day (BID) | ORAL | 0 refills | Status: DC

## 2020-09-02 MED ORDER — EMGALITY 120 MG/ML ~~LOC~~ SOAJ: 120.0000 mg | SUBCUTANEOUS | 0 refills | Status: DC

## 2020-09-02 MED ORDER — ESCITALOPRAM OXALATE 10 MG PO TABS: 10.0000 mg | ORAL_TABLET | Freq: Every day | ORAL | 2 refills | Status: DC

## 2020-09-03 MED ORDER — CABERGOLINE 0.5 MG PO TABS: 0.5000 mg | ORAL_TABLET | ORAL | 3 refills | Status: DC

## 2020-09-03 MED ORDER — METFORMIN HCL 500 MG PO TABS: 500.0000 mg | ORAL_TABLET | Freq: Two times a day (BID) | ORAL | 3 refills | Status: DC

## 2020-09-07 ENCOUNTER — Telehealth: Payer: Self-pay | Admitting: Neurology

## 2020-09-07 NOTE — Telephone Encounter (Signed)
Patient called and said she would like  a call back today regarding her meds. She is out of her tramadol and she does mail by order. That will take forever for her to get them. Can she get a partial [facility] [address] in [place].

## 2020-09-09 ENCOUNTER — Emergency Department (HOSPITAL_BASED_OUTPATIENT_CLINIC_OR_DEPARTMENT_OTHER)
Admission: EM | Admit: 2020-09-09 | Discharge: 2020-09-09 | Disposition: A | Discharge status: Home / Self Care | Attending: Emergency Medicine | Admitting: Emergency Medicine

## 2020-09-09 ENCOUNTER — Other Ambulatory Visit: Payer: Self-pay

## 2020-09-09 ENCOUNTER — Encounter (HOSPITAL_BASED_OUTPATIENT_CLINIC_OR_DEPARTMENT_OTHER): Payer: Self-pay

## 2020-09-09 DIAGNOSIS — R112 Nausea with vomiting, unspecified: Secondary | ICD-10-CM

## 2020-09-09 DIAGNOSIS — R197 Diarrhea, unspecified: Secondary | ICD-10-CM | POA: Diagnosis not present

## 2020-09-09 DIAGNOSIS — Z87891 Personal history of nicotine dependence: Secondary | ICD-10-CM | POA: Insufficient documentation

## 2020-09-09 DIAGNOSIS — J45909 Unspecified asthma, uncomplicated: Secondary | ICD-10-CM | POA: Diagnosis not present

## 2020-09-09 DIAGNOSIS — G43909 Migraine, unspecified, not intractable, without status migrainosus: Secondary | ICD-10-CM | POA: Insufficient documentation

## 2020-09-09 LAB — COMPREHENSIVE METABOLIC PANEL
ALT: 17 U/L (ref 0–44)
AST: 17 U/L (ref 15–41)
Albumin: 3.8 g/dL (ref 3.5–5.0)
Alkaline Phosphatase: 45 U/L (ref 38–126)
Anion gap: 6 (ref 5–15)
BUN: 9 mg/dL (ref 6–20)
CO2: 25 mmol/L (ref 22–32)
Calcium: 8.7 mg/dL — ABNORMAL LOW (ref 8.9–10.3)
Chloride: 105 mmol/L (ref 98–111)
Creatinine, Ser: 0.87 mg/dL (ref 0.44–1.00)
GFR, Estimated: >60 mL/min (ref >60)
Glucose, Bld: 103 mg/dL — ABNORMAL HIGH (ref 70–99)
Potassium: 3.6 mmol/L (ref 3.5–5.1)
Sodium: 136 mmol/L (ref 135–145)
Total Bilirubin: 0.4 mg/dL (ref 0.3–1.2)
Total Protein: 7.2 g/dL (ref 6.5–8.1)

## 2020-09-09 LAB — CBC WITH DIFFERENTIAL/PLATELET
Abs Immature Granulocytes: 0.02 10*3/uL (ref 0.00–0.07)
Basophils Absolute: 0 10*3/uL (ref 0.0–0.1)
Basophils Relative: 0 %
Eosinophils Absolute: 0.1 10*3/uL (ref 0.0–0.5)
Eosinophils Relative: 1 %
HCT: 37.1 % (ref 36.0–46.0)
Hemoglobin: 12.7 g/dL (ref 12.0–15.0)
Immature Granulocytes: 0 %
Lymphocytes Relative: 31 %
Lymphs Abs: 1.8 10*3/uL (ref 0.7–4.0)
MCH: 29.5 pg (ref 26.0–34.0)
MCHC: 34.2 g/dL (ref 30.0–36.0)
MCV: 86.1 fL (ref 80.0–100.0)
Monocytes Absolute: 0.7 10*3/uL (ref 0.1–1.0)
Monocytes Relative: 12 %
Neutro Abs: 3.2 10*3/uL (ref 1.7–7.7)
Neutrophils Relative %: 56 %
Platelets: 278 10*3/uL (ref 150–400)
RBC: 4.31 MIL/uL (ref 3.87–5.11)
RDW: 13.3 % (ref 11.5–15.5)
WBC: 5.9 10*3/uL (ref 4.0–10.5)
nRBC: 0 % (ref 0.0–0.2)

## 2020-09-09 LAB — LIPASE, BLOOD: Lipase: 22 U/L (ref 11–51)

## 2020-09-09 MED ORDER — DIPHENHYDRAMINE HCL 50 MG/ML IJ SOLN
50.0000 mg | Freq: Once | INTRAMUSCULAR | Status: AC
  Administered 2020-09-09: 50 mg via INTRAVENOUS
  Filled 2020-09-09: qty 1

## 2020-09-09 MED ORDER — HYDROMORPHONE HCL 1 MG/ML IJ SOLN
1.0000 mg | Freq: Once | INTRAMUSCULAR | Status: AC
  Administered 2020-09-09: 1 mg via INTRAVENOUS
  Filled 2020-09-09: qty 1

## 2020-09-09 MED ORDER — DEXAMETHASONE SODIUM PHOSPHATE 10 MG/ML IJ SOLN
10.0000 mg | Freq: Once | INTRAMUSCULAR | Status: AC
  Administered 2020-09-09: 10 mg via INTRAVENOUS
  Filled 2020-09-09: qty 1

## 2020-09-09 MED ORDER — SODIUM CHLORIDE 0.9 % IV BOLUS
1000.0000 mL | Freq: Once | INTRAVENOUS | Status: AC
  Administered 2020-09-09: 1000 mL via INTRAVENOUS

## 2020-09-09 MED ORDER — DROPERIDOL 2.5 MG/ML IJ SOLN
2.5000 mg | Freq: Once | INTRAMUSCULAR | Status: AC
  Administered 2020-09-09: 2.5 mg via INTRAVENOUS
  Filled 2020-09-09: qty 2

## 2020-09-09 MED ORDER — METOCLOPRAMIDE HCL 5 MG/ML IJ SOLN
10.0000 mg | Freq: Once | INTRAMUSCULAR | Status: AC
  Administered 2020-09-09: 10 mg via INTRAVENOUS
  Filled 2020-09-09: qty 2

## 2020-09-09 NOTE — Discharge Instructions (Addendum)
Continue taking home medications as prescribed. Make sure you stay well-hydrated with water. Follow-up with your either your primary care doctor or your neurologist as needed for recheck of symptoms. Return to the emergency room if you develop high fevers, severe worsening headache, vision changes, persistent vomiting, or any new, worsening, or concerning symptoms

## 2020-09-09 NOTE — ED Notes (Signed)
Pt able to tolerate drinking water and eating crackers without vomiting.

## 2020-09-09 NOTE — ED Notes (Signed)
Patient reports she thinks she got food poisoning from eating [employer] on Monday night. She woke up Tuesday morning with n/v/d/headache and fever.

## 2020-09-09 NOTE — ED Triage Notes (Signed)
Pt c/o day 2 migraine-NAD-steady gait

## 2020-09-09 NOTE — ED Notes (Signed)
Pt A&Ox4, ambulatory at d/c with independent steady gait, NAD. Pt reports she will be calling an uber for a ride home. Pt verbalized understanding of d/c instructions and follow up care.

## 2020-09-09 NOTE — Telephone Encounter (Signed)
Pt advised for Tramadol we will not be able to send in refill to another pharmacy to pickup.  Pt insurance will not pay for both.  Pt verbalized understanding.

## 2020-09-09 NOTE — ED Provider Notes (Signed)
[facility] EMERGENCY DEPARTMENT Provider Note   CSN: [id number] Arrival date & time: 09/09/20  1956     History Chief Complaint  Patient presents with   Migraine    Amber [name] is a 40 y.o. female presenting for evaluation of migraine. Patient states that past 2 days she has had a migraine.  She states is typical for her normal migraines.  Patient states 3 days ago she ate Amber [name], and has had nausea, vomiting, diarrhea since.  Her diarrhea is slowly resolving, however she continues to have issues with nausea and vomiting, as such is unable to take her Topamax as a preventative migraine medication.  She has tried Nurtec and Zofran at home without improvement of symptoms.  She denies fevers, chills, chest pain, shortness of breath, abdominal pain, urinary symptoms.  She denies dizziness, lightheadedness, vision changes, slurred speech, numbness or weakness on one side.  Additional history obtained from chart review.  Patient with a history of anxiety, asthma, depression, GERD, history of migraines, pituitary adenoma which per recent MRI was decreasing in size, and seizures.  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   Pre-diabetes 12/25/2019   Infertility counseling 12/24/2019   Well woman exam 12/24/2019   Hyperglycemia 06/05/2019   Insomnia 01/07/2019   Bilateral hand pain 11/09/2018   Agoraphobia 01/18/2018   Anxiety with depression 03/24/2017   Migraine 05/11/2016   Pituitary adenoma ([place])  03/05/2016   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    Past Surgical History:  Procedure Laterality Date   APPENDECTOMY     CHOLECYSTECTOMY     Pituiary Tumor     Tumor Removal Pituitary     OB History     Gravida  0   Para  0   Term  0   Preterm  0   AB  0   Living  0      SAB  0   IAB  0   Ectopic  0   Multiple  0   Live Births  0           Family History  Problem Relation Age of Onset   Diabetes Mother    Migraines Mother    Hyperlipidemia Mother    Bipolar disorder Mother    Irritable bowel syndrome Mother    Diabetes Father    Colon cancer Father    Cancer Father        colon   Hyperlipidemia Father    Hypertension Father    Bipolar disorder Brother    Migraines Sister    Other Neg Hx        pituitary disorder   Esophageal cancer Neg Hx     Social History   Tobacco Use   Smoking status: Former    Packs/day: 1.00    Years: 15.00    Pack years: 15.00    Types: Cigarettes    Quit date: 01/24/2016  Years since quitting: 4.6   Smokeless tobacco: Never  Vaping Use   Vaping Use: Former  Substance Use Topics   Alcohol use: No   Drug use: No    Home Medications Prior to Admission medications   Medication Sig Start Date End Date Taking? Authorizing Provider  acetaminophen (TYLENOL) 500 MG tablet Take 500 mg by mouth as needed.    [provider]  cabergoline (DOSTINEX) 0.5 MG tablet Take 1 tablet (0.5 mg total) by mouth as directed. 1 mg twice a week and 0.5 mg once a week 09/03/20   Shamleffer, Melanie Crazier, MD  escitalopram (LEXAPRO) 10 MG tablet Take 1 tablet (10 mg total) by mouth daily. 09/02/20   Shelda Pal, DO  famotidine (PEPCID) 20 MG tablet Take 20 mg by mouth 2 (two) times daily.    [provider]  Galcanezumab-gnlm (EMGALITY) 120 MG/ML SOAJ Inject 120 mg into the skin every 28 (twenty-eight) days. 09/02/20   Tomi Likens, Adam R, DO  hydrOXYzine (ATARAX/VISTARIL) 25 MG tablet TAKE 1-3 TABLETS (25-75  MG TOTAL) BY MOUTH 3 (THREE) TIMES DAILY AS NEEDED FOR ANXIETY. 01/22/18   Shelda Pal, DO  lidocaine (LIDODERM) 5 % Place 1 patch onto the skin every 12 (twelve) hours. Remove & Discard patch within 12 hours or as directed by MD 05/07/20   Rosemarie Ax, MD  metFORMIN (GLUCOPHAGE) 500 MG tablet Take 1 tablet (500 mg total) by mouth 2 (two) times daily with a meal. 09/03/20   Shamleffer, Melanie Crazier, MD  ondansetron (ZOFRAN ODT) 8 MG disintegrating tablet Take 1 tablet (8 mg total) by mouth every 8 (eight) hours as needed for nausea or vomiting. 09/02/20   Pieter Partridge, DO  Oxycodone HCl 10 MG TABS Take 0.5-1 tablets (5-10 mg total) by mouth every 8 (eight) hours as needed (Pain). 04/15/20   Shelda Pal, DO  pantoprazole (PROTONIX) 40 MG tablet Take 1 tablet (40 mg total) by mouth 2 (two) times daily. 07/17/20   Shelda Pal, DO  predniSONE (DELTASONE) 10 MG tablet Take 60mg  on day 1, then 50mg  on day 2, then 40mg  on day 3, then 30mg  on day 4, then 20mg  on day 5, then 10mg  on day 6, then STOP 04/07/20   Pieter Partridge, DO  Prenatal Vit-Fe Fumarate-FA (PRENATAL VITAMIN) 27-0.8 MG TABS Take 1 tablet by mouth daily. 12/24/19   Seabron Spates, CNM  QUEtiapine (SEROQUEL) 300 MG tablet Take 1 tablet (300 mg total) by mouth at bedtime. 07/17/20   Shelda Pal, DO  Riboflavin 400 MG CAPS Take 400 mg by mouth daily.    [provider]  Rimegepant Sulfate (NURTEC) 75 MG TBDP Take 75 mg by mouth as needed (Daily as needed for a Migraine). Maximum 1 tablet in 24 hours.  Quantity 8. 09/02/20   Tomi Likens, Adam R, DO  topiramate (TOPAMAX) 100 MG tablet Take 1 tablet (100 mg total) by mouth 2 (two) times daily. 09/02/20   Pieter Partridge, DO    Allergies    Aspirin, Chlorhexidine, Ibuprofen, Iodine, Lunesta [eszopiclone], Nsaids, Shellfish allergy, Sulfa antibiotics, and Tape  Review of Systems   Review of Systems  Eyes:  Positive for photophobia.   Gastrointestinal:  Positive for diarrhea, nausea and vomiting.  Neurological:  Positive for headaches.  All other systems reviewed and are negative.  Physical Exam Updated Vital Signs BP 102/69 (BP Location: Left Arm)   Pulse 70   Temp 98.4 F (36.9 C) (Oral)  Resp 20   Ht 5\' 6"  (1.676 m)   Wt 118.4 kg   LMP 08/25/2020   SpO2 96%   BMI 42.13 kg/m   Physical Exam Vitals and nursing note reviewed.  Constitutional:      General: She is not in acute distress.    Appearance: Normal appearance. She is obese.     Comments: nontoxic  HENT:     Head: Normocephalic and atraumatic.  Eyes:     Conjunctiva/sclera: Conjunctivae normal.     Pupils: Pupils are equal, round, and reactive to light.  Cardiovascular:     Rate and Rhythm: Normal rate and regular rhythm.     Pulses: Normal pulses.  Pulmonary:     Effort: Pulmonary effort is normal. No respiratory distress.     Breath sounds: Normal breath sounds. No wheezing.     Comments: Speaking in full sentences.  Clear lung sounds in all fields. Abdominal:     General: There is no distension.     Palpations: Abdomen is soft.     Tenderness: There is no abdominal tenderness.  Musculoskeletal:        General: Normal range of motion.     Cervical back: Normal range of motion and neck supple.  Skin:    General: Skin is warm and dry.     Capillary Refill: Capillary refill takes less than 2 seconds.  Neurological:     Mental Status: She is alert and oriented to person, place, and time.     GCS: GCS eye subscore is 4. GCS verbal subscore is 5. GCS motor subscore is 6.     Cranial Nerves: Cranial nerves are intact.     Sensory: Sensation is intact.     Motor: Motor function is intact.     Comments: No neurodeficits.  CN intact.  Strength and sensation intact x4.  Psychiatric:        Mood and Affect: Mood and affect normal.        Speech: Speech normal.        Behavior: Behavior normal.    ED Results / Procedures / Treatments    Labs (all labs ordered are listed, but only abnormal results are displayed) Labs Reviewed  COMPREHENSIVE METABOLIC PANEL - Abnormal; Notable for the following components:      Result Value   Glucose, Bld 103 (*)    Calcium 8.7 (*)    All other components within normal limits  CBC WITH DIFFERENTIAL/PLATELET  LIPASE, BLOOD  PREGNANCY, URINE    EKG None  Radiology No results found.  Procedures Procedures   Medications Ordered in ED Medications  metoCLOPramide (REGLAN) injection 10 mg (10 mg Intravenous Given 09/09/20 2107)  dexamethasone (DECADRON) injection 10 mg (10 mg Intravenous Given 09/09/20 2108)  diphenhydrAMINE (BENADRYL) injection 50 mg (50 mg Intravenous Given 09/09/20 2107)  sodium chloride 0.9 % bolus 1,000 mL (0 mLs Intravenous Stopped 09/09/20 2254)  droperidol (INAPSINE) 2.5 MG/ML injection 2.5 mg (2.5 mg Intravenous Given 09/09/20 2213)  HYDROmorphone (DILAUDID) injection 1 mg (1 mg Intravenous Given 09/09/20 2256)    ED Course  I have reviewed the triage vital signs and the nursing notes.  Pertinent labs & imaging results that were available during my care of the patient were reviewed by me and considered in my medical decision making (see chart for details).    MDM Rules/Calculators/A&P  Patient presenting for evaluation of migraine.  On exam, patient peers nontoxic.  She is neurovascularly intact.  No neurodeficits.  History of migraines, states this feels similar.  She has had issues with nausea and vomiting recently, and been unable to take her preventative, this is likely what triggered her migraine.  However in the setting of abdominal symptoms, will obtain labs.  Will treat with migraine cocktail.  Patient has improved nausea after migraine cocktail, but still reports severe headache.  Labs interpreted by me overall reassuring.  Kidney, liver, pancreatic function normal.  Will ensure patient is able to tolerate p.o. and give another  round of medication.  On reevaluation, patient reports continued severe headache.  She is tolerating p.o. without difficulty.  Patient states she normally receives dilaudid for her HA with improvement. I discussed this is not typical treatment of HA and my concern for rebound. Per chart review, pt has received dilaudid in the past for her HA with improvement.   On reevaluation, pt states HA is much improved. She feels well enough to go home. I encouraged f/u with PCP/neuro as needed. At this time, pt appears safe for d/c. Return precautions given. Pt states she understands and agrees to plan.   Final Clinical Impression(s) / ED Diagnoses Final diagnoses:  Migraine without status migrainosus, not intractable, unspecified migraine type  Nausea vomiting and diarrhea    Rx / DC Orders ED Discharge Orders     None        Franchot Heidelberg, PA-C 09/09/20 2329    Lennice Sites, DO 09/11/20 0034

## 2020-09-23 ENCOUNTER — Other Ambulatory Visit: Payer: Self-pay | Admitting: Family Medicine

## 2020-09-23 MED ORDER — QUETIAPINE FUMARATE 300 MG PO TABS: 300.0000 mg | ORAL_TABLET | Freq: Every day | ORAL | 0 refills | Status: DC

## 2020-09-23 NOTE — Telephone Encounter (Signed)
Pt has given [place] a call stating that she needs her Seroquel sent to the local supply to start since she is currently out of medication. She will like the other 90 day supply sent to her mail order pharmacy since it is cheaper there.   I have only pended one medication to since that is what the system is letting me do.   Please advise on refill. Pt does have a CPE scheduled for 10/07/20.

## 2020-10-07 ENCOUNTER — Other Ambulatory Visit: Payer: Self-pay

## 2020-10-07 ENCOUNTER — Ambulatory Visit (INDEPENDENT_AMBULATORY_CARE_PROVIDER_SITE_OTHER): Admitting: Family Medicine

## 2020-10-07 ENCOUNTER — Encounter: Payer: Self-pay | Admitting: Family Medicine

## 2020-10-07 VITALS — BP 120/82 | HR 94 | Temp 98.4°F | Ht 66.0 in | Wt 261.1 lb

## 2020-10-07 DIAGNOSIS — Z23 Encounter for immunization: Secondary | ICD-10-CM | POA: Diagnosis not present

## 2020-10-07 DIAGNOSIS — G4700 Insomnia, unspecified: Secondary | ICD-10-CM

## 2020-10-07 DIAGNOSIS — Z1231 Encounter for screening mammogram for malignant neoplasm of breast: Secondary | ICD-10-CM

## 2020-10-07 DIAGNOSIS — Z0001 Encounter for general adult medical examination with abnormal findings: Secondary | ICD-10-CM

## 2020-10-07 DIAGNOSIS — M546 Pain in thoracic spine: Secondary | ICD-10-CM

## 2020-10-07 MED ORDER — OLANZAPINE 10 MG PO TABS: 10.0000 mg | ORAL_TABLET | Freq: Every day | ORAL | 2 refills | Status: DC

## 2020-10-07 NOTE — Patient Instructions (Signed)
Give [place] 2-3 business days to get the results of your labs back.   Keep the diet clean and stay active.  Someone will reach out regarding the mammogram.  Ice/cold pack over area for 10-15 min twice daily.  Heat (pad or rice pillow in microwave) over affected area, 10-15 minutes twice daily.   OK to take Tylenol 1000 mg (2 extra strength tabs) or 975 mg (3 regular strength tabs) every 6 hours as needed.  Let us know if you need anything.  Mid-Back Strain Rehab It is normal to feel mild stretching, pulling, tightness, or discomfort as you do these exercises, but you should stop right away if you feel sudden pain or your pain gets worse.  Stretching and range of motion exercises This exercise warms up your muscles and joints and improves the movement and flexibility of your back and shoulders. This exercise also help to relieve pain. Exercise A: Chest and spine stretch  Lie down on your back on a firm surface. Roll a towel or a small blanket so it is about 4 inches (10 cm) in diameter. Put the towel lengthwise under the middle of your back so it is under your spine, but not under your shoulder blades. To increase the stretch, you may put your hands behind your head and let your elbows fall to your sides. Hold for 30 seconds. Repeat exercise 2 times. Complete this exercise 3 times per week. Strengthening exercises These exercises build strength and endurance in your back and your shoulder blade muscles. Endurance is the ability to use your muscles for a long time, even after they get tired. Exercise C: Straight arm rows (shoulder extension) Stand with your feet shoulder width apart. Secure an exercise band to a stable object in front of you so the band is at or above shoulder height. Hold one end of the exercise band in each hand. Straighten your elbows and lift your hands up to shoulder height. Step back, away from the secured end of the exercise band, until the band stretches. Squeeze  your shoulder blades together and pull your hands down to the sides of your thighs. Stop when your hands are straight down by your sides. Do not let your hands go behind your body. Hold for 2 seconds. Slowly return to the starting position. Repeat 2 times. Complete this exercise 3 times per week. Exercise D: Shoulder external rotation, prone Lie on your abdomen on a firm bed so your left / right forearm hangs over the edge of the bed and your upper arm is on the bed, straight out from your body. Your elbow should be bent. Your palm should be facing your feet. If instructed, hold a 2-5 lb weight in your hand. Squeeze your shoulder blade toward the middle of your back. Do not let your shoulder lift toward your ear. Keep your elbow bent in an "L" shape (90 degrees) while you slowly move your forearm up toward the ceiling. Move your forearm up to the height of the bed, toward your head. Your upper arm should not move. At the top of the movement, your palm should face the floor. Hold for 1 second. Slowly return to the starting position and relax your muscles. Repeat 2 times. Complete this exercise 3 times per week. Exercise E: Scapular retraction and external rotation, rowing  Sit in a stable chair without armrests, or stand. Secure an exercise band to a stable object in front of you so it is at shoulder height. Hold one  end of the exercise band in each hand. Bring your arms out straight in front of you. Step back, away from the secured end of the exercise band, until the band stretches. Pull the band backward. As you do this, bend your elbows and squeeze your shoulder blades together, but avoid letting the rest of your body move. Do not let your shoulders lift up toward your ears. Stop when your elbows are at your sides or slightly behind your body. Hold for 1 second1. Slowly straighten your arms to return to the starting position. Repeat 2 times. Complete this exercise 3 times per  week. Posture and body mechanics  Body mechanics refers to the movements and positions of your body while you do your daily activities. Posture is part of body mechanics. Good posture and healthy body mechanics can help to relieve stress in your body's tissues and joints. Good posture means that your spine is in its natural S-curve position (your spine is neutral), your shoulders are pulled back slightly, and your head is not tipped forward. The following are general guidelines for applying improved posture and body mechanics to your everyday activities. Standing  When standing, keep your spine neutral and your feet about hip-width apart. Keep a slight bend in your knees. Your ears, shoulders, and hips should line up. When you do a task in which you lean forward while standing in one place for a long time, place one foot up on a stable object that is 2-4 inches (5-10 cm) high, such as a footstool. This helps keep your spine neutral. Sitting  When sitting, keep your spine neutral and keep your feet flat on the floor. Use a footrest, if necessary, and keep your thighs parallel to the floor. Avoid rounding your shoulders, and avoid tilting your head forward. When working at a desk or a computer, keep your desk at a height where your hands are slightly lower than your elbows. Slide your chair under your desk so you are close enough to maintain good posture. When working at a computer, place your monitor at a height where you are looking straight ahead and you do not have to tilt your head forward or downward to look at the screen. Resting  When lying down and resting, avoid positions that are most painful for you. If you have pain with activities such as sitting, bending, stooping, or squatting (flexion-based activities), lie in a position in which your body does not bend very much. For example, avoid curling up on your side with your arms and knees near your chest (fetal position). If you have pain  with activities such as standing for a long time or reaching with your arms (extension-based activities), lie with your spine in a neutral position and bend your knees slightly. Try the following positions: Lying on your side with a pillow between your knees. Lying on your back with a pillow under your knees.  Lifting  When lifting objects, keep your feet at least shoulder-width apart and tighten your abdominal muscles. Bend your knees and hips and keep your spine neutral. It is important to lift using the strength of your legs, not your back. Do not lock your knees straight out. Always ask for help to lift heavy or awkward objects. Make sure you discuss any questions you have with your health care provider. Document Released: 03/07/2005 Document Revised: 11/12/2015 Document Reviewed: 12/17/2014 Elsevier Interactive Patient Education  [employer].

## 2020-10-07 NOTE — Progress Notes (Signed)
Chief Complaint  Patient presents with   Annual Exam     Well Woman Amber [name] is here for a complete physical.   Her last physical was >1 year ago.  Current diet: in general, diet has been healthier. Current exercise: walking. Weight is stable and she denies fatigue out of ordinary. Seatbelt? Yes  Health Maintenance Pap/HPV- Yes Mammogram- No Tetanus- Due Hep C screening- Yes HIV screening- Yes  Neck pain 6 d of lower mid neck pain. Decreased forward flexion. OK rotation. Sharp/shooting pain. Slightly better, but not sleeping. No bruising, redness, swelling. No neuro s/s's. Tried oxycodone (left over from ribs).   Insomnia The patient has been taking Seroquel 300 mg nightly for sleep.  She has difficulty falling asleep but is able to stay asleep in part thinks to the medicine.  [place] use to help her fall asleep as well but it has not been as effective.  She reports compliance without adverse effects.  She saw sleep specialist in the past but does not wish to go back.  She is wondering if there is any dosage adjustment we can make or another medicine.  Past Medical History:  Diagnosis Date   Anxiety    Asthma    Depression    GERD (gastroesophageal reflux disease)    History of chicken pox    History of migraine headaches    Pituitary adenoma ([place])    Seizures ([place])      Past Surgical History:  Procedure Laterality Date   APPENDECTOMY     CHOLECYSTECTOMY     Pituiary Tumor     Tumor Removal Pituitary    Medications  Current Outpatient Medications on File Prior to Visit  Medication Sig Dispense Refill   acetaminophen (TYLENOL) 500 MG tablet Take 500 mg by mouth as needed.     cabergoline (DOSTINEX) 0.5 MG tablet Take 1 tablet (0.5 mg total) by mouth as directed. 1 mg twice a week and 0.5 mg once a week 65 tablet 3   escitalopram (LEXAPRO) 10 MG tablet Take 1 tablet (10 mg total) by mouth daily. 90 tablet 2   famotidine (PEPCID) 20 MG tablet Take 20 mg by  mouth 2 (two) times daily.     Galcanezumab-gnlm (EMGALITY) 120 MG/ML SOAJ Inject 120 mg into the skin every 28 (twenty-eight) days. 1 mL 0   hydrOXYzine (ATARAX/VISTARIL) 25 MG tablet TAKE 1-3 TABLETS (25-75 MG TOTAL) BY MOUTH 3 (THREE) TIMES DAILY AS NEEDED FOR ANXIETY. 270 tablet 1   lidocaine (LIDODERM) 5 % Place 1 patch onto the skin every 12 (twelve) hours. Remove & Discard patch within 12 hours or as directed by MD 30 patch 2   metFORMIN (GLUCOPHAGE) 500 MG tablet Take 1 tablet (500 mg total) by mouth 2 (two) times daily with a meal. 180 tablet 3   ondansetron (ZOFRAN ODT) 8 MG disintegrating tablet Take 1 tablet (8 mg total) by mouth every 8 (eight) hours as needed for nausea or vomiting. 20 tablet 0   Oxycodone HCl 10 MG TABS Take 0.5-1 tablets (5-10 mg total) by mouth every 8 (eight) hours as needed (Pain). 20 tablet 0   pantoprazole (PROTONIX) 40 MG tablet Take 1 tablet (40 mg total) by mouth 2 (two) times daily. 180 tablet 1   predniSONE (DELTASONE) 10 MG tablet Take 60mg  on day 1, then 50mg  on day 2, then 40mg  on day 3, then 30mg  on day 4, then 20mg  on day 5, then 10mg  on day 6, then STOP 21  tablet 0   Prenatal Vit-Fe Fumarate-FA (PRENATAL VITAMIN) 27-0.8 MG TABS Take 1 tablet by mouth daily. 90 tablet 5   QUEtiapine (SEROQUEL) 300 MG tablet Take 1 tablet (300 mg total) by mouth at bedtime. 15 tablet 0   Riboflavin 400 MG CAPS Take 400 mg by mouth daily.     Rimegepant Sulfate (NURTEC) 75 MG TBDP Take 75 mg by mouth as needed (Daily as needed for a Migraine). Maximum 1 tablet in 24 hours.  Quantity 8. 8 tablet 0   topiramate (TOPAMAX) 100 MG tablet Take 1 tablet (100 mg total) by mouth 2 (two) times daily. 60 tablet 0    Allergies Allergies  Allergen Reactions   Aspirin    Chlorhexidine    Ibuprofen    Iodine    Lunesta [Eszopiclone] Other (See Comments)    GI issues   Nsaids Nausea And Vomiting   Shellfish Allergy     Tingle in mouth/throat   Sulfa Antibiotics    Tape Rash     Blisters     Review of Systems: Constitutional:  no unexpected weight changes Eye:  no recent significant change in vision Ear/Nose/Mouth/Throat:  Ears:  no recent change in hearing Nose/Mouth/Throat:  no complaints of nasal congestion, no sore throat Cardiovascular: no chest pain Respiratory:  no shortness of breath Gastrointestinal:  no abdominal pain, no change in bowel habits GU:  Female: negative for dysuria or pelvic pain Musculoskeletal/Extremities:  + Neck/upper back pain Integumentary (Skin/Breast):  no abnormal skin lesions reported Neurologic:  no headaches Endocrine:  denies fatigue Hematologic/Lymphatic:  No areas of easy bleeding  Exam BP 120/82   Pulse 94   Temp 98.4 F (36.9 C) (Oral)   Ht 5\' 6"  (1.676 m)   Wt 261 lb 2 oz (118.4 kg)   SpO2 99%   BMI 42.15 kg/m  General:  well developed, well nourished, in no apparent distress Skin:  no significant moles, warts, or growths Head:  no masses, lesions, or tenderness Eyes:  pupils equal and round, sclera anicteric without injection Ears:  canals without lesions, TMs shiny without retraction, no obvious effusion, no erythema Nose:  nares patent, septum midline, mucosa normal, and no drainage or sinus tenderness Throat/Pharynx:  lips and gingiva without lesion; tongue and uvula midline; non-inflamed pharynx; no exudates or postnasal drainage Neck: neck supple without adenopathy, thyromegaly, or masses Lungs:  clear to auscultation, breath sounds equal bilaterally, no respiratory distress Cardio:  regular rate and rhythm, no LE edema Abdomen:  abdomen soft, nontender; bowel sounds normal; no masses or organomegaly Genital: Defer to GYN Musculoskeletal: Tenderness over the thoracic midline from T3-T7; no deformity or lateral musculature tenderness Extremities:  no clubbing, cyanosis, or edema, no deformities, no skin discoloration Neuro:  gait normal; deep tendon reflexes normal and symmetric Psych: well  oriented with normal range of affect and appropriate judgment/insight  Assessment and Plan  Encounter for well adult exam with abnormal findings - Plan: CBC, Comprehensive metabolic panel, Lipid panel  Encounter for screening mammogram for malignant neoplasm of breast  Insomnia, unspecified type - Plan: OLANZapine (ZYPREXA) 10 MG tablet  Acute midline thoracic back pain  Need for Td vaccine - Plan: Td : Tetanus/diphtheria >7yo Preservative  free   Well 40 y.o. female. Counseled on diet and exercise. Other orders as above. Back pain-heat, ice, Tylenol, stretches and exercises for the thoracic spine. Insomnia-chronic, unstable.  Stop Seroquel, add Zyprexa 10 mg nightly. Follow up in 1 month to recheck insomnia and pain. The  patient voiced understanding and agreement to the plan.  [place], DO 10/07/20 4:45 PM

## 2020-10-08 ENCOUNTER — Other Ambulatory Visit: Payer: Self-pay | Admitting: Family Medicine

## 2020-10-08 LAB — LDL CHOLESTEROL, DIRECT: Direct LDL: 77 mg/dL

## 2020-10-08 LAB — COMPREHENSIVE METABOLIC PANEL
ALT: 11 U/L (ref 0–35)
AST: 14 U/L (ref 0–37)
Albumin: 4.2 g/dL (ref 3.5–5.2)
Alkaline Phosphatase: 50 U/L (ref 39–117)
BUN: 12 mg/dL (ref 6–23)
CO2: 24 mEq/L (ref 19–32)
Calcium: 9.3 mg/dL (ref 8.4–10.5)
Chloride: 107 mEq/L (ref 96–112)
Creatinine, Ser: 1.02 mg/dL (ref 0.40–1.20)
GFR: 68.71 mL/min (ref >60.00)
Glucose, Bld: 78 mg/dL (ref 70–99)
Potassium: 4.5 mEq/L (ref 3.5–5.1)
Sodium: 138 mEq/L (ref 135–145)
Total Bilirubin: 0.3 mg/dL (ref 0.2–1.2)
Total Protein: 7 g/dL (ref 6.0–8.3)

## 2020-10-08 LAB — LIPID PANEL
Cholesterol: 185 mg/dL (ref 0–200)
HDL: 28.1 mg/dL — ABNORMAL LOW (ref >39.00)
Total CHOL/HDL Ratio: 7
Triglycerides: 622 mg/dL — ABNORMAL HIGH (ref 0.0–149.0)

## 2020-10-08 LAB — CBC
HCT: 38.8 % (ref 36.0–46.0)
Hemoglobin: 12.9 g/dL (ref 12.0–15.0)
MCHC: 33.4 g/dL (ref 30.0–36.0)
MCV: 87.2 fl (ref 78.0–100.0)
Platelets: 353 10*3/uL (ref 150.0–400.0)
RBC: 4.45 Mil/uL (ref 3.87–5.11)
RDW: 13.7 % (ref 11.5–15.5)
WBC: 9.4 10*3/uL (ref 4.0–10.5)

## 2020-10-08 MED ORDER — ROSUVASTATIN CALCIUM 20 MG PO TABS: 20.0000 mg | ORAL_TABLET | Freq: Every day | ORAL | 3 refills | Status: DC

## 2020-10-09 ENCOUNTER — Other Ambulatory Visit: Payer: Self-pay | Admitting: Family Medicine

## 2020-10-09 DIAGNOSIS — R739 Hyperglycemia, unspecified: Secondary | ICD-10-CM

## 2020-10-15 ENCOUNTER — Telehealth (INDEPENDENT_AMBULATORY_CARE_PROVIDER_SITE_OTHER): Admitting: Neurology

## 2020-10-15 ENCOUNTER — Other Ambulatory Visit: Payer: Self-pay

## 2020-10-15 ENCOUNTER — Encounter: Payer: Self-pay | Admitting: Neurology

## 2020-10-15 DIAGNOSIS — G44019 Episodic cluster headache, not intractable: Secondary | ICD-10-CM

## 2020-10-15 DIAGNOSIS — G43409 Hemiplegic migraine, not intractable, without status migrainosus: Secondary | ICD-10-CM

## 2020-10-15 DIAGNOSIS — D352 Benign neoplasm of pituitary gland: Secondary | ICD-10-CM | POA: Diagnosis not present

## 2020-10-15 MED ORDER — EMGALITY 120 MG/ML ~~LOC~~ SOAJ: 120.0000 mg | SUBCUTANEOUS | 5 refills | Status: DC

## 2020-10-15 MED ORDER — TRAMADOL HCL 50 MG PO TABS: 50.0000 mg | ORAL_TABLET | Freq: Four times a day (QID) | ORAL | 2 refills | Status: DC | PRN

## 2020-10-15 NOTE — Progress Notes (Signed)
Virtual Visit via Video Note The purpose of this virtual visit is to provide medical care while limiting exposure to the novel coronavirus.    Consent was obtained for video visit:  Yes.   Answered questions that patient had about telehealth interaction:  Yes.   I discussed the limitations, risks, security and privacy concerns of performing an evaluation and management service by telemedicine. I also discussed with the patient that there may be a patient responsible charge related to this service. The patient expressed understanding and agreed to proceed.  Pt location: Home Physician Location: office Name of referring provider:  Shelda Pal* I connected with Amber [name] at patients initiation/request on 10/15/2020 at  3:30 PM EDT by video enabled telemedicine application and verified that I am speaking with the correct person using two identifiers. Pt MRN:  [id number] Pt DOB:  April 10, 1980 Video Participants:  Amber [name]  Assessment and Plan:   1.  Hemiplegic migraine, intractable 2.  Cluster headache, infrequent 2.  Pituitary microadenoma    Migraines have been uncontrolled due to stress and weather.  Now that major stressors have resolved, we will see how her migraines actually respond to the [facility] 1.  Migraine prevention:  Emgality every 21 days. 3.  Nurtec for rescue, tramadol second line.  Zofran for nausea. 4.  Limit use of pain relievers to no more than 2 days out of week to prevent risk of rebound or medication-overuse headache. 5.  Keep headache diary 6.  Follow up in 6 months.  History of Present Illness:  Amber [name] is a 40 year old female with pilonidal cyst with hyperprolactinemia and anxiety with depression who follows up for hemiplegic migraine.   UPDATE: In January, I had her start taking Emgality every 21 days since her migraines congregated the last couple of weeks prior to next injection.  Unfortunately, migraines have  increased significantly, which she attributes to significant increased stress (she has been trying to get her mother to move out, which took some time) as well as waiting for response from social security regarding her disability.  Her mother finally has moved out.  She also was granted disability, so hopefully migraines will start to improve. Intensity: moderate Duration:  2 hours with Nurtec Frequency:  18-19 days in July - she reports significant stress and change in weather as primary cause.   Frequency of abortive: 2 to 3 days a week Rescue protocol:  Zofran, Nurtec (tramadol second line) Current NSAIDS:  no Current analgesics:  tramadol Current triptans:  no Current ergot:  Dostinex (for hyperprolactinemia) Current anti-emetic:  Zofran '8mg'$  Current muscle relaxants:  no Current anti-anxiolytic:  hydroxyzine Current sleep aide:  quetiapine '50mg'$  Current Antihypertensive medications:  no Current Antidepressant medications:  Lexapro '10mg'$  Current Anticonvulsant medications:  topiramate '100mg'$  twice daily Current CGRP inhibitor:  Emgality, Nurtec (rescue) Current Vitamins/Herbal/Supplements:  magnesium oxide '400mg'$ , riboflavin '400mg'$  Current Antihistamines/Decongestants:  Benadryl Other therapy:  no   Caffeine:  1 cup coffee rarely Alcohol:  no Smoker:  no Diet:  60 oz water daily.  Little fast food.  No soda.  Eats chicken and fish.  Not skipping meals.   Exercise:  no Depression/anxiety:  yes Other pain: On 10/01/2019, she woke up with left sided neck pain radiating down the left arm.  Tramadol and tizanidine was ineffective.  She did not recall straining her neck.  She went to the ED where she was diagnosed with trapezius muscle strain and cervical radiculopathy.  CT cervical spine personally  reviewed showed minor degenerative disc disease at C6-C7 but no spinal or neural foraminal stenosis.  She is currently on a prednisone taper.   Sleep hygiene:  Improved with quetiapine   Prolactin  level still elevated, however MRI of pituitary with and without contrast from 08/31/2019 personally reviewed showed regression of the microadenoma since 2018.  Workup is ongoing.  She is undergoing cardiac evaluation as well.   HISTORY: Migraines Onset:  Childhood Location:  Left sided Quality:  throbbing Initial intensity:  severe Aura:  no Prodrome:  Feels ill for 3 days prior Postdrome:  "hangover" effect for 1 day after Associated symptoms:  Left sided numbness of face, arm and leg with slight weakness of left arm and leg.  Nausea, photophobia, and phonophobia.  She has not had any new worse headache of her life, waking up from sleep Initial Duration:  Several hours to several days.  Over the summer, she had status migrainosis lasting 13-14 weeks. Initial Frequency:  3 to 4 days a week Initial Frequency of abortive medication: 3 to 4 days a week Triggers/aggravating factors:  Sleep deprivation, shifting positions Relieving factors:  Laying down in dark and quiet room Activity:  Aggravates.   Past NSAIDS:  Ibuprofen.  Cannot take NSAIDs due to GI bleed Past analgesics:  Tramadol (decreases intensity), Excedrin Past abortive triptans/ergot:  Sumatriptan (increased headache) Past muscle relaxants:  no Past anti-emetic:  Reglan (contraindicated with cabergoline), Zofran '4mg'$ , Promethazine Past antihypertensive medications:  Propranolol '80mg'$  (hypotension) Past antidepressant medications:  Prozac; venlafaxine XR '150mg'$  daily Past anticonvulsant medications:  no Past CGRP inhibitors:  Roselyn Meier Past vitamins/Herbal/Supplements:  no Past antihistamines/decongestants:  no Other past therapies:  no Unable to afford Botox with her insurance plan.    Cluster Headache: Right orbital, stabbing, severe, associated with ptosis and conjunctival injection, lasting several hours and occurring 1 to 2 times a month.   Family history of headache:  mom   MRI of brain with and without contrast from  05/25/16 was personally reviewed and revealed 6 x 9 mm hypoenhancing pituitary microadenoma without compression of the optic chiasm.   Past Medical History: Past Medical History:  Diagnosis Date   Anxiety    Asthma    Depression    GERD (gastroesophageal reflux disease)    History of chicken pox    History of migraine headaches    Pituitary adenoma ([place])    Seizures ([place])     Medications: Outpatient Encounter Medications as of 10/15/2020  Medication Sig   acetaminophen (TYLENOL) 500 MG tablet Take 500 mg by mouth as needed.   cabergoline (DOSTINEX) 0.5 MG tablet Take 1 tablet (0.5 mg total) by mouth as directed. 1 mg twice a week and 0.5 mg once a week   escitalopram (LEXAPRO) 10 MG tablet Take 1 tablet (10 mg total) by mouth daily.   famotidine (PEPCID) 20 MG tablet Take 20 mg by mouth 2 (two) times daily.   Galcanezumab-gnlm (EMGALITY) 120 MG/ML SOAJ Inject 120 mg into the skin every 28 (twenty-eight) days.   hydrOXYzine (ATARAX/VISTARIL) 25 MG tablet TAKE 1-3 TABLETS (25-75 MG TOTAL) BY MOUTH 3 (THREE) TIMES DAILY AS NEEDED FOR ANXIETY.   lidocaine (LIDODERM) 5 % Place 1 patch onto the skin every 12 (twelve) hours. Remove & Discard patch within 12 hours or as directed by MD   metFORMIN (GLUCOPHAGE) 500 MG tablet Take 1 tablet (500 mg total) by mouth 2 (two) times daily with a meal.   OLANZapine (ZYPREXA) 10 MG tablet Take 1  tablet (10 mg total) by mouth at bedtime.   ondansetron (ZOFRAN ODT) 8 MG disintegrating tablet Take 1 tablet (8 mg total) by mouth every 8 (eight) hours as needed for nausea or vomiting.   Oxycodone HCl 10 MG TABS Take 0.5-1 tablets (5-10 mg total) by mouth every 8 (eight) hours as needed (Pain).   pantoprazole (PROTONIX) 40 MG tablet Take 1 tablet (40 mg total) by mouth 2 (two) times daily.   Prenatal Vit-Fe Fumarate-FA (PRENATAL VITAMIN) 27-0.8 MG TABS Take 1 tablet by mouth daily.   Riboflavin 400 MG CAPS Take 400 mg by mouth daily.   Rimegepant Sulfate  (NURTEC) 75 MG TBDP Take 75 mg by mouth as needed (Daily as needed for a Migraine). Maximum 1 tablet in 24 hours.  Quantity 8.   rosuvastatin (CRESTOR) 20 MG tablet Take 1 tablet (20 mg total) by mouth daily.   topiramate (TOPAMAX) 100 MG tablet Take 1 tablet (100 mg total) by mouth 2 (two) times daily.   No facility-administered encounter medications on file as of 10/15/2020.    Allergies: Allergies  Allergen Reactions   Aspirin    Chlorhexidine    Ibuprofen    Iodine    Lunesta [Eszopiclone] Other (See Comments)    GI issues   Nsaids Nausea And Vomiting   Shellfish Allergy     Tingle in mouth/throat   Sulfa Antibiotics    Tape Rash    Blisters     Family History: Family History  Problem Relation Age of Onset   Diabetes Mother    Migraines Mother    Hyperlipidemia Mother    Bipolar disorder Mother    Irritable bowel syndrome Mother    Diabetes Father    Colon cancer Father    Cancer Father        colon   Hyperlipidemia Father    Hypertension Father    Bipolar disorder Brother    Migraines Sister    Other Neg Hx        pituitary disorder   Esophageal cancer Neg Hx     Observations/Objective:   There were no vitals taken for this visit. No acute distress.  Alert and oriented.  Speech fluent and not dysarthric.     Follow Up Instructions:    -I discussed the assessment and treatment plan with the patient. The patient was provided an opportunity to ask questions and all were answered. The patient agreed with the plan and demonstrated an understanding of the instructions.   The patient was advised to call back or seek an in-person evaluation if the symptoms worsen or if the condition fails to improve as anticipated.  Dudley Major, DO

## 2020-10-23 ENCOUNTER — Telehealth: Payer: Self-pay | Admitting: Family Medicine

## 2020-10-23 MED ORDER — RISPERIDONE 1 MG PO TABS: 1.0000 mg | ORAL_TABLET | Freq: Every day | ORAL | 0 refills | Status: DC

## 2020-10-23 MED ORDER — RISPERIDONE 1 MG PO TABS
1.0000 mg | ORAL_TABLET | Freq: Every day | ORAL | 0 refills | Status: DC
Start: 2020-10-23 — End: 2020-10-23

## 2020-10-23 NOTE — Telephone Encounter (Signed)
Stop that one, I sent in a new one. OK to refer to sleep team if she desires. Ty.

## 2020-10-23 NOTE — Telephone Encounter (Signed)
Patient informed of change. 

## 2020-10-23 NOTE — Telephone Encounter (Signed)
The olanzapine was just filled 2 days ago. The medication did not put her to sleep until 1AM after taking it at 9PM. She felt bad the next morning. She had sore joints. She take at 9 last night and woke at 2 and did not return to sleep.  She is having back pain and achy joints. She states the medication is not working well for her sleep as well as believes it is causing her achy joints as well. Advise on changing this please.

## 2020-10-26 ENCOUNTER — Other Ambulatory Visit: Payer: Self-pay | Admitting: Family Medicine

## 2020-10-26 MED ORDER — QUETIAPINE FUMARATE 300 MG PO TABS: 300.0000 mg | ORAL_TABLET | Freq: Every day | ORAL | 2 refills | Status: DC

## 2020-11-10 ENCOUNTER — Ambulatory Visit (INDEPENDENT_AMBULATORY_CARE_PROVIDER_SITE_OTHER): Admitting: Family Medicine

## 2020-11-10 ENCOUNTER — Other Ambulatory Visit: Payer: Self-pay

## 2020-11-10 ENCOUNTER — Encounter: Payer: Self-pay | Admitting: Family Medicine

## 2020-11-10 VITALS — BP 110/80 | HR 95 | Temp 98.8°F | Ht 66.0 in | Wt 253.5 lb

## 2020-11-10 DIAGNOSIS — S46811A Strain of other muscles, fascia and tendons at shoulder and upper arm level, right arm, initial encounter: Secondary | ICD-10-CM | POA: Diagnosis not present

## 2020-11-10 DIAGNOSIS — E781 Pure hyperglyceridemia: Secondary | ICD-10-CM | POA: Diagnosis not present

## 2020-11-10 DIAGNOSIS — H6981 Other specified disorders of Eustachian tube, right ear: Secondary | ICD-10-CM

## 2020-11-10 MED ORDER — CYCLOBENZAPRINE HCL 10 MG PO TABS: 5.0000 mg | ORAL_TABLET | Freq: Three times a day (TID) | ORAL | 0 refills | Status: DC | PRN

## 2020-11-10 MED ORDER — PREDNISONE 20 MG PO TABS: 40.0000 mg | ORAL_TABLET | Freq: Every day | ORAL | 0 refills | Status: AC

## 2020-11-10 NOTE — Progress Notes (Signed)
Musculoskeletal Exam  Patient: Amber [name] DOB: 17-Jun-1980  DOS: 11/10/2020  SUBJECTIVE:  Chief Complaint:   Chief Complaint  Patient presents with   Shoulder Pain   Arm Pain    Right shoulder and arm pain Right ear pain/fullness    Amber [name] is a 40 y.o.  female for evaluation and treatment of mid back pain.   Onset:  3 weeks ago. No inj or change in activity.  Location: Trap region on R Character:  throbbing and tightness   Progression of issue:  worsening Associated symptoms: radiating down RUE Denies bruising, redness, swelling, decreased ROM Treatment: to date has been rest, ice, and heat.   Neurovascular symptoms: no  2 d of R ear pain. No fevers, drainage, hearing loss, URI s/s's. No longer uses Q tips. Tried flushing it out without relief. Has a Bluetooth app where she can see inside and there was no wax yesterday. Feels clicking/popping.   Hypertriglyceridemia Patient presents for hypertriglyceridemia follow up. Currently being treated with Crestor 20 mg/d and compliance with treatment thus far has been good. She denies myalgias. She is adhering to a healthy diet. Exercise: walking The patient is not known to have coexisting coronary artery disease.  Past Medical History:  Diagnosis Date   Anxiety    Asthma    Depression    GERD (gastroesophageal reflux disease)    History of chicken pox    History of migraine headaches    Pituitary adenoma (HCC)    Seizures (HCC)     Objective: VITAL SIGNS: BP 110/80   Pulse 95   Temp 98.8 F (37.1 C) (Oral)   Ht '5\' 6"'$  (1.676 m)   Wt 253 lb 8 oz (115 kg)   SpO2 99%   BMI 40.92 kg/m  Constitutional: Well formed, well developed. No acute distress. Thorax & Lungs: No accessory muscle use Musculoskeletal: R trap.   Normal active range of motion: yes.   Normal passive range of motion: yes Tenderness to palpation: Yes over the right trapezius Deformity: no Ecchymosis: no Tests positive:  None Tests negative: Spurling's, Neer's, Hawkins, empty can, crossover, liftoff, speeds Neurologic: Normal sensory function. No focal deficits noted. DTR's equal and symmetric in UE's. No clonus.  Grip strength adequate bilaterally. Psychiatric: Normal mood. Age appropriate judgment and insight. Alert & oriented x 3.    Assessment:  Strain of right trapezius muscle, initial encounter - Plan: cyclobenzaprine (FLEXERIL) 10 MG tablet, predniSONE (DELTASONE) 20 MG tablet  Dysfunction of right eustachian tube - Plan: predniSONE (DELTASONE) 20 MG tablet  Hypertriglyceridemia - Plan: Hepatic function panel, Lipid panel  Plan: Stretches/exercises, heat, ice, Tylenol.  Prednisone burst.  Warnings about Flexeril verbalized and written down.  She has been on it before and knows it will make her tired so we will plan on taking at night only. 5 d pred burst should help for this and #1.  Chronic, unstable.  Check today, continue Crestor 20 mg daily.  Will likely need to add Tricor and recheck in 4 weeks. F/u in 1 month to recheck above. The patient voiced understanding and agreement to the plan.   [place], DO 11/10/20  2:32 PM

## 2020-11-10 NOTE — Patient Instructions (Signed)
Give [place] 2-3 business days to get the results of your labs back.   Keep the diet clean and stay active.  Heat (pad or rice pillow in microwave) over affected area, 10-15 minutes twice daily.   Ice/cold pack over area for 10-15 min twice daily.  OK to take Tylenol 1000 mg (2 extra strength tabs) or 975 mg (3 regular strength tabs) every 6 hours as needed.  Let us know if you need anything.  Trapezius stretches/exercises Do exercises exactly as told by your health care provider and adjust them as directed. It is normal to feel mild stretching, pulling, tightness, or discomfort as you do these exercises, but you should stop right away if you feel sudden pain or your pain gets worse.   Stretching and range of motion exercises These exercises warm up your muscles and joints and improve the movement and flexibility of your shoulder. These exercises can also help to relieve pain, numbness, and tingling. If you are unable to do any of the following for any reason, do not further attempt to do it.   Exercise A: Flexion, standing     Stand and hold a broomstick, a cane, or a similar object. Place your hands a little more than shoulder-width apart on the object. Your left / right hand should be palm-up, and your other hand should be palm-down. Push the stick to raise your left / right arm out to your side and then over your head. Use your other hand to help move the stick. Stop when you feel a stretch in your shoulder, or when you reach the angle that is recommended by your health care provider. Avoid shrugging your shoulder while you raise your arm. Keep your shoulder blade tucked down toward your spine. Hold for 30 seconds. Slowly return to the starting position. Repeat 2 times. Complete this exercise 3 times per week.  Exercise B: Abduction, supine     Lie on your back and hold a broomstick, a cane, or a similar object. Place your hands a little more than shoulder-width apart on the object.  Your left / right hand should be palm-up, and your other hand should be palm-down. Push the stick to raise your left / right arm out to your side and then over your head. Use your other hand to help move the stick. Stop when you feel a stretch in your shoulder, or when you reach the angle that is recommended by your health care provider. Avoid shrugging your shoulder while you raise your arm. Keep your shoulder blade tucked down toward your spine. Hold for 30 seconds. Slowly return to the starting position. Repeat 2 times. Complete this exercise 3 times per week.  Exercise C: Flexion, active-assisted     Lie on your back. You may bend your knees for comfort. Hold a broomstick, a cane, or a similar object. Place your hands about shoulder-width apart on the object. Your palms should face toward your feet. Raise the stick and move your arms over your head and behind your head, toward the floor. Use your healthy arm to help your left / right arm move farther. Stop when you feel a gentle stretch in your shoulder, or when you reach the angle where your health care provider tells you to stop. Hold for 30 seconds. Slowly return to the starting position. Repeat 2 times. Complete this exercise 3 times per week.  Exercise D: External rotation and abduction     Stand in a door frame with one  of your feet slightly in front of the other. This is called a staggered stance. Choose one of the following positions as told by your health care provider: Place your hands and forearms on the door frame above your head. Place your hands and forearms on the door frame at the height of your head. Place your hands on the door frame at the height of your elbows. Slowly move your weight onto your front foot until you feel a stretch across your chest and in the front of your shoulders. Keep your head and chest upright and keep your abdominal muscles tight. Hold for 30 seconds. To release the stretch, shift your weight  to your back foot. Repeat 2 times. Complete this stretch 3 times per week.  Strengthening exercises These exercises build strength and endurance in your shoulder. Endurance is the ability to use your muscles for a long time, even after your muscles get tired. Exercise E: Scapular depression and adduction  Sit on a stable chair. Support your arms in front of you with pillows, armrests, or a tabletop. Keep your elbows in line with the sides of your body. Gently move your shoulder blades down toward your middle back. Relax the muscles on the tops of your shoulders and in the back of your neck. Hold for 3 seconds. Slowly release the tension and relax your muscles completely before doing this exercise again. Repeat for a total of 10 repetitions. After you have practiced this exercise, try doing the exercise without the arm support. Then, try the exercise while standing instead of sitting. Repeat 2 times. Complete this exercise 3 times per week.  Exercise F: Shoulder abduction, isometric     Stand or sit about 4-6 inches (10-15 cm) from a wall with your left / right side facing the wall. Bend your left / right elbow and gently press your elbow against the wall. Increase the pressure slowly until you are pressing as hard as you can without shrugging your shoulder. Hold for 3 seconds. Slowly release the tension and relax your muscles completely. Repeat for a total of 10 repetitions. Repeat 2 times. Complete this exercise 3 times per week.  Exercise G: Shoulder flexion, isometric     Stand or sit about 4-6 inches (10-15 cm) away from a wall with your left / right side facing the wall. Keep your left / right elbow straight and gently press the top of your fist against the wall. Increase the pressure slowly until you are pressing as hard as you can without shrugging your shoulder. Hold for 10-15 seconds. Slowly release the tension and relax your muscles completely. Repeat for a total of 10  repetitions. Repeat 2 times. Complete this exercise 3 times per week.  Exercise H: Internal rotation     Sit in a stable chair without armrests, or stand. Secure an exercise band at your left / right side, at elbow height. Place a soft object, such as a folded towel or a small pillow, under your left / right upper arm so your elbow is a few inches (about 8 cm) away from your side. Hold the end of the exercise band so the band stretches. Keeping your elbow pressed against the soft object under your arm, move your forearm across your body toward your abdomen. Keep your body steady so the movement is only coming from your shoulder. Hold for 3 seconds. Slowly return to the starting position. Repeat for a total of 10 repetitions. Repeat 2 times. Complete this exercise 3  times per week.  Exercise I: External rotation     Sit in a stable chair without armrests, or stand. Secure an exercise band at your left / right side, at elbow height. Place a soft object, such as a folded towel or a small pillow, under your left / right upper arm so your elbow is a few inches (about 8 cm) away from your side. Hold the end of the exercise band so the band stretches. Keeping your elbow pressed against the soft object under your arm, move your forearm out, away from your abdomen. Keep your body steady so the movement is only coming from your shoulder. Hold for 3 seconds. Slowly return to the starting position. Repeat for a total of 10 repetitions. Repeat 2 times. Complete this exercise 3 times per week. Exercise J: Shoulder extension  Sit in a stable chair without armrests, or stand. Secure an exercise band to a stable object in front of you so the band is at shoulder height. Hold one end of the exercise band in each hand. Your palms should face each other. Straighten your elbows and lift your hands up to shoulder height. Step back, away from the secured end of the exercise band, until the band  stretches. Squeeze your shoulder blades together and pull your hands down to the sides of your thighs. Stop when your hands are straight down by your sides. Do not let your hands go behind your body. Hold for 3 seconds. Slowly return to the starting position. Repeat for a total of 10 repetitions. Repeat 2 times. Complete this exercise 3 times per week.  Exercise K: Shoulder extension, prone     Lie on your abdomen on a firm surface so your left / right arm hangs over the edge. Hold a 5 lb weight in your hand so your palm faces in toward your body. Your arm should be straight. Squeeze your shoulder blade down toward the middle of your back. Slowly raise your arm behind you, up to the height of the surface that you are lying on. Keep your arm straight. Hold for 3 seconds. Slowly return to the starting position and relax your muscles. Repeat for a total of 10 repetitions. Repeat 2 times. Complete this exercise 3 times per week.   Exercise L: Horizontal abduction, prone  Lie on your abdomen on a firm surface so your left / right arm hangs over the edge. Hold a 5 lb weight in your hand so your palm faces toward your feet. Your arm should be straight. Squeeze your shoulder blade down toward the middle of your back. Bend your elbow so your hand moves up, until your elbow is bent to an "L" shape (90 degrees). With your elbow bent, slowly move your forearm forward and up. Raise your hand up to the height of the surface that you are lying on. Your upper arm should not move, and your elbow should stay bent. At the top of the movement, your palm should face the floor. Hold for 3 seconds. Slowly return to the starting position and relax your muscles. Repeat for a total of 10 repetitions. Repeat 2 times. Complete this exercise 3 times per week.  Exercise M: Horizontal abduction, standing  Sit on a stable chair, or stand. Secure an exercise band to a stable object in front of you so the band is at  shoulder height. Hold one end of the exercise band in each hand. Straighten your elbows and lift your hands straight in front  of you, up to shoulder height. Your palms should face down, toward the floor. Step back, away from the secured end of the exercise band, until the band stretches. Move your arms out to your sides, and keep your arms straight. Hold for 3 seconds. Slowly return to the starting position. Repeat for a total of 10 repetitions. Repeat 2 times. Complete this exercise 3 times per week.  Exercise N: Scapular retraction and elevation  Sit on a stable chair, or stand. Secure an exercise band to a stable object in front of you so the band is at shoulder height. Hold one end of the exercise band in each hand. Your palms should face each other. Sit in a stable chair without armrests, or stand. Step back, away from the secured end of the exercise band, until the band stretches. Squeeze your shoulder blades together and lift your hands over your head. Keep your elbows straight. Hold for 3 seconds. Slowly return to the starting position. Repeat for a total of 10 repetitions. Repeat 2 times. Complete this exercise 3 times per week.  This information is not intended to replace advice given to you by your health care provider. Make sure you discuss any questions you have with your health care provider. Document Released: 03/07/2005 Document Revised: 11/12/2015 Document Reviewed: 01/22/2015 Elsevier Interactive Patient Education  2017 [employer].

## 2020-11-11 ENCOUNTER — Other Ambulatory Visit: Payer: Self-pay | Admitting: Family Medicine

## 2020-11-11 LAB — LIPID PANEL
Cholesterol: 97 mg/dL (ref 0–200)
HDL: 32.4 mg/dL — ABNORMAL LOW (ref >39.00)
LDL Cholesterol: 25 mg/dL (ref 0–99)
NonHDL: 65.06
Total CHOL/HDL Ratio: 3
Triglycerides: 199 mg/dL — ABNORMAL HIGH (ref 0.0–149.0)
VLDL: 39.8 mg/dL (ref 0.0–40.0)

## 2020-11-11 LAB — HEPATIC FUNCTION PANEL
ALT: 9 U/L (ref 0–35)
AST: 12 U/L (ref 0–37)
Albumin: 4.2 g/dL (ref 3.5–5.2)
Alkaline Phosphatase: 53 U/L (ref 39–117)
Bilirubin, Direct: 0.1 mg/dL (ref 0.0–0.3)
Total Bilirubin: 0.5 mg/dL (ref 0.2–1.2)
Total Protein: 7.2 g/dL (ref 6.0–8.3)

## 2020-11-11 MED ORDER — FENOFIBRATE 48 MG PO TABS: 48.0000 mg | ORAL_TABLET | Freq: Every day | ORAL | 3 refills | Status: DC

## 2020-12-11 ENCOUNTER — Ambulatory Visit (INDEPENDENT_AMBULATORY_CARE_PROVIDER_SITE_OTHER): Admitting: Family Medicine

## 2020-12-11 ENCOUNTER — Other Ambulatory Visit: Payer: Self-pay

## 2020-12-11 ENCOUNTER — Encounter: Payer: Self-pay | Admitting: Family Medicine

## 2020-12-11 VITALS — BP 118/72 | HR 86 | Temp 98.3°F | Ht 66.0 in | Wt 262.0 lb

## 2020-12-11 DIAGNOSIS — E781 Pure hyperglyceridemia: Secondary | ICD-10-CM

## 2020-12-11 LAB — HEPATIC FUNCTION PANEL
AG Ratio: 1.7 (calc) (ref 1.0–2.5)
ALT: 13 U/L (ref 6–29)
AST: 14 U/L (ref 10–30)
Albumin: 4.4 g/dL (ref 3.6–5.1)
Alkaline phosphatase (APISO): 40 U/L (ref 31–125)
Bilirubin, Direct: 0.1 mg/dL (ref 0.0–0.2)
Globulin: 2.6 g/dL (calc) (ref 1.9–3.7)
Indirect Bilirubin: 0.2 mg/dL (calc) (ref 0.2–1.2)
Total Bilirubin: 0.3 mg/dL (ref 0.2–1.2)
Total Protein: 7 g/dL (ref 6.1–8.1)

## 2020-12-11 LAB — LIPID PANEL
Cholesterol: 112 mg/dL (ref <200)
HDL: 32 mg/dL — ABNORMAL LOW (ref >=50)
LDL Cholesterol (Calc): 50 mg/dL (calc)
Non-HDL Cholesterol (Calc): 80 mg/dL (calc) (ref <130)
Total CHOL/HDL Ratio: 3.5 (calc) (ref <5.0)
Triglycerides: 258 mg/dL — ABNORMAL HIGH (ref <150)

## 2020-12-11 NOTE — Patient Instructions (Addendum)
Give [place] 2-3 business days to get the results of your labs back. Our follow up will be based on the results.   Keep the diet clean and stay active.  Let us know if you need anything.

## 2020-12-11 NOTE — Progress Notes (Signed)
Chief Complaint  Patient presents with   Follow-up    Cholesterol medication    Subjective: Hyperlipidemia Patient presents for Hyperlipidemia follow up. Currently taking Crestor 20 mg/d, recently add Tricor 48 mg/d and compliance with treatment thus far has been good. She denies myalgias. She is improving her diet. Exercise: cycling, hula hooping The patient is not known to have coexisting coronary artery disease. No CP or SOB.  Past Medical History:  Diagnosis Date   Anxiety    Asthma    Depression    GERD (gastroesophageal reflux disease)    History of chicken pox    History of migraine headaches    Pituitary adenoma (HCC)    Seizures (HCC)     Objective: BP 118/72   Pulse 86   Temp 98.3 F (36.8 C) (Oral)   Ht 5\' 6"  (1.676 m)   Wt 262 lb (118.8 kg)   SpO2 99%   BMI 42.29 kg/m  General: Awake, appears stated age Heart: RRR, no LE edema, no bruits Lungs: CTAB, no rales, wheezes or rhonchi. No accessory muscle use Psych: Age appropriate judgment and insight, normal affect and mood  Assessment and Plan: Hypertriglyceridemia - Plan: Lipid panel, Hepatic function panel  Orders as above. Cont Crestor 20 mg/d, Tricor 48 mg/d. Counseled on diet/exercise.  F/u pending the above. The patient voiced understanding and agreement to the plan.  [place], DO 12/11/20  1:47 PM

## 2020-12-12 ENCOUNTER — Other Ambulatory Visit: Payer: Self-pay | Admitting: Family Medicine

## 2020-12-12 MED ORDER — FENOFIBRATE 145 MG PO TABS: 145.0000 mg | ORAL_TABLET | Freq: Every day | ORAL | 1 refills | Status: DC

## 2020-12-14 ENCOUNTER — Other Ambulatory Visit: Payer: Self-pay | Admitting: Family Medicine

## 2020-12-14 DIAGNOSIS — E781 Pure hyperglyceridemia: Secondary | ICD-10-CM

## 2020-12-28 ENCOUNTER — Other Ambulatory Visit: Payer: Self-pay

## 2021-01-01 MED ORDER — TOPIRAMATE 100 MG PO TABS: 100.0000 mg | ORAL_TABLET | Freq: Two times a day (BID) | ORAL | 3 refills | Status: DC

## 2021-01-07 MED ORDER — QUETIAPINE FUMARATE 300 MG PO TABS: 300.0000 mg | ORAL_TABLET | Freq: Every day | ORAL | 2 refills | Status: DC

## 2021-01-16 ENCOUNTER — Other Ambulatory Visit: Payer: Self-pay | Admitting: Family Medicine

## 2021-01-18 ENCOUNTER — Other Ambulatory Visit: Payer: Self-pay | Admitting: Family Medicine

## 2021-01-28 ENCOUNTER — Other Ambulatory Visit: Payer: Self-pay | Admitting: Neurology

## 2021-01-28 MED ORDER — PANTOPRAZOLE SODIUM 40 MG PO TBEC: 40.0000 mg | DELAYED_RELEASE_TABLET | Freq: Two times a day (BID) | ORAL | 1 refills | Status: DC

## 2021-01-29 ENCOUNTER — Other Ambulatory Visit: Payer: Self-pay | Admitting: Neurology

## 2021-01-29 MED ORDER — TRAMADOL HCL 50 MG PO TABS: 50.0000 mg | ORAL_TABLET | Freq: Four times a day (QID) | ORAL | 2 refills | Status: DC | PRN

## 2021-03-06 ENCOUNTER — Other Ambulatory Visit: Payer: Self-pay

## 2021-03-06 ENCOUNTER — Emergency Department (HOSPITAL_BASED_OUTPATIENT_CLINIC_OR_DEPARTMENT_OTHER)
Admission: EM | Admit: 2021-03-06 | Discharge: 2021-03-06 | Disposition: A | Discharge status: Home / Self Care | Attending: Emergency Medicine | Admitting: Emergency Medicine

## 2021-03-06 ENCOUNTER — Encounter (HOSPITAL_BASED_OUTPATIENT_CLINIC_OR_DEPARTMENT_OTHER): Payer: Self-pay | Admitting: Emergency Medicine

## 2021-03-06 DIAGNOSIS — G43909 Migraine, unspecified, not intractable, without status migrainosus: Secondary | ICD-10-CM | POA: Diagnosis not present

## 2021-03-06 DIAGNOSIS — J45909 Unspecified asthma, uncomplicated: Secondary | ICD-10-CM | POA: Diagnosis not present

## 2021-03-06 DIAGNOSIS — Z7984 Long term (current) use of oral hypoglycemic drugs: Secondary | ICD-10-CM | POA: Insufficient documentation

## 2021-03-06 DIAGNOSIS — Z87891 Personal history of nicotine dependence: Secondary | ICD-10-CM | POA: Insufficient documentation

## 2021-03-06 DIAGNOSIS — R519 Headache, unspecified: Secondary | ICD-10-CM | POA: Diagnosis present

## 2021-03-06 MED ORDER — HYDROMORPHONE HCL 1 MG/ML IJ SOLN
1.0000 mg | Freq: Once | INTRAMUSCULAR | Status: AC
Start: 2021-03-06 — End: 2021-03-06
  Administered 2021-03-06: 1 mg via INTRAVENOUS
  Filled 2021-03-06: qty 1

## 2021-03-06 MED ORDER — SODIUM CHLORIDE 0.9 % IV BOLUS
1000.0000 mL | Freq: Once | INTRAVENOUS | Status: AC
  Administered 2021-03-06: 1000 mL via INTRAVENOUS

## 2021-03-06 MED ORDER — DIPHENHYDRAMINE HCL 50 MG/ML IJ SOLN
25.0000 mg | Freq: Once | INTRAMUSCULAR | Status: AC
  Administered 2021-03-06: 25 mg via INTRAVENOUS
  Filled 2021-03-06: qty 1

## 2021-03-06 MED ORDER — DEXAMETHASONE 4 MG PO TABS
10.0000 mg | ORAL_TABLET | Freq: Once | ORAL | Status: AC
  Administered 2021-03-06: 10 mg via ORAL
  Filled 2021-03-06: qty 3

## 2021-03-06 MED ORDER — PROCHLORPERAZINE EDISYLATE 10 MG/2ML IJ SOLN
10.0000 mg | Freq: Once | INTRAMUSCULAR | Status: AC
  Administered 2021-03-06: 10 mg via INTRAVENOUS
  Filled 2021-03-06: qty 2

## 2021-03-06 NOTE — Discharge Instructions (Signed)
Follow-up with your neurologist.  Please return for atypical headache or if it is uncontrolled or you are unable to take your medicines at home.

## 2021-03-06 NOTE — ED Provider Notes (Signed)
[facility] EMERGENCY DEPARTMENT Provider Note   CSN: [id number] Arrival date & time: 03/06/21  1722     History Chief Complaint  Patient presents with   Migraine    Amber [name] is a 40 y.o. female.  40 yo F with a chief complaints of a migraine headache.  Patient has a history of this states that she did not get her normal amount of sleep last night and that usually is a known trigger for her.  At 6 AM had a left-sided headache that is typical for her migraines.  She denies fever denies head injury denies neck stiffness.  She denies one-sided numbness or weakness difficulty speech or swallowing.  She tried her home abortive medications without improvement.  She then proceeded to the ED because typically needs a migraine cocktail to control her symptoms.  Had some episodes of emesis at home.  Tried Zofran without improvement.  The history is provided by the patient.  Migraine This is a recurrent problem. The current episode started 6 to 12 hours ago. The problem occurs constantly. The problem has not changed since onset.Associated symptoms include headaches. Pertinent negatives include no chest pain and no shortness of breath. Nothing aggravates the symptoms. Nothing relieves the symptoms. She has tried rest and water (home abortive medicine) for the symptoms. The treatment provided no relief.      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/24/2019   Well woman exam 12/24/2019   Hyperglycemia 06/05/2019   Insomnia 01/07/2019   Bilateral hand pain 11/09/2018   Agoraphobia 01/18/2018   Anxiety with depression 03/24/2017   Migraine 05/11/2016   Pituitary adenoma ([place]) 03/05/2016   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    Past Surgical History:  Procedure Laterality Date   APPENDECTOMY     CHOLECYSTECTOMY     Pituiary Tumor     Tumor Removal Pituitary     OB History     Gravida  0   Para  0   Term  0   Preterm  0   AB  0   Living  0      SAB  0   IAB  0   Ectopic  0   Multiple  0   Live Births  0           Family History  Problem Relation Age of Onset   Diabetes Mother    Migraines Mother    Hyperlipidemia Mother    Bipolar disorder Mother    Irritable bowel syndrome Mother    Diabetes Father    Colon cancer Father    Cancer Father        colon   Hyperlipidemia Father    Hypertension Father    Bipolar disorder Brother    Migraines Sister    Other Neg Hx        pituitary disorder   Esophageal cancer  Neg Hx     Social History   Tobacco Use   Smoking status: Former    Packs/day: 1.00    Years: 15.00    Pack years: 15.00    Types: Cigarettes    Quit date: 01/24/2016    Years since quitting: 5.1   Smokeless tobacco: Never  Vaping Use   Vaping Use: Former  Substance Use Topics   Alcohol use: No   Drug use: No    Home Medications Prior to Admission medications   Medication Sig Start Date End Date Taking? Authorizing Provider  QUEtiapine (SEROQUEL) 300 MG tablet TAKE ONE TABLET BY MOUTH AT BEDTIME 01/18/21   Wendling, Crosby Oyster, DO  acetaminophen (TYLENOL) 500 MG tablet Take 500 mg by mouth as needed.    [provider]  cabergoline (DOSTINEX) 0.5 MG tablet Take 1 tablet (0.5 mg total) by mouth as directed. 1 mg twice a week and 0.5 mg once a week 09/03/20   Shamleffer, Melanie Crazier, MD  cyclobenzaprine (FLEXERIL) 10 MG tablet Take 0.5-1 tablets  (5-10 mg total) by mouth 3 (three) times daily as needed for muscle spasms. 11/10/20   Shelda Pal, DO  escitalopram (LEXAPRO) 10 MG tablet Take 1 tablet (10 mg total) by mouth daily. 09/02/20   Shelda Pal, DO  famotidine (PEPCID) 20 MG tablet Take 20 mg by mouth 2 (two) times daily.    [provider]  fenofibrate (TRICOR) 145 MG tablet Take 1 tablet (145 mg total) by mouth daily. 12/12/20   Wendling, Crosby Oyster, DO  Galcanezumab-gnlm (EMGALITY) 120 MG/ML SOAJ Inject 120 mg into the skin every 28 (twenty-eight) days. 10/15/20   Tomi Likens, Adam R, DO  hydrOXYzine (ATARAX/VISTARIL) 25 MG tablet TAKE 1-3 TABLETS (25-75 MG TOTAL) BY MOUTH 3 (THREE) TIMES DAILY AS NEEDED FOR ANXIETY. 01/22/18   Shelda Pal, DO  lidocaine (LIDODERM) 5 % Place 1 patch onto the skin every 12 (twelve) hours. Remove & Discard patch within 12 hours or as directed by MD 05/07/20   Rosemarie Ax, MD  metFORMIN (GLUCOPHAGE) 500 MG tablet Take 1 tablet (500 mg total) by mouth 2 (two) times daily with a meal. 09/03/20   Shamleffer, Melanie Crazier, MD  pantoprazole (PROTONIX) 40 MG tablet Take 1 tablet (40 mg total) by mouth 2 (two) times daily. 01/28/21   Shelda Pal, DO  Riboflavin 400 MG CAPS Take 400 mg by mouth daily.    [provider]  Rimegepant Sulfate (NURTEC) 75 MG TBDP Take 75 mg by mouth as needed (Daily as needed for a Migraine). Maximum 1 tablet in 24 hours.  Quantity 8. 09/02/20   Tomi Likens, Adam R, DO  rosuvastatin (CRESTOR) 20 MG tablet Take 1 tablet (20 mg total) by mouth daily. 10/08/20   Shelda Pal, DO  topiramate (TOPAMAX) 100 MG tablet Take 1 tablet (100 mg total) by mouth 2 (two) times daily. 01/01/21   Pieter Partridge, DO  traMADol (ULTRAM) 50 MG tablet Take 1 tablet (50 mg total) by mouth every 6 (six) hours as needed. 01/29/21   Pieter Partridge, DO    Allergies    Aspirin, Chlorhexidine, Ibuprofen, Iodine, Lunesta [eszopiclone], Nsaids,  Shellfish allergy, Sulfa antibiotics, and Tape  Review of Systems   Review of Systems  Constitutional:  Negative for chills and fever.  HENT:  Negative for congestion and rhinorrhea.   Eyes:  Negative for redness and visual disturbance.  Respiratory:  Negative for shortness of breath and wheezing.  Cardiovascular:  Negative for chest pain and palpitations.  Gastrointestinal:  Positive for nausea and vomiting.  Genitourinary:  Negative for dysuria and urgency.  Musculoskeletal:  Negative for arthralgias and myalgias.  Skin:  Negative for pallor and wound.  Neurological:  Positive for headaches. Negative for dizziness.   Physical Exam Updated Vital Signs BP 96/63    Pulse 66    Temp 98 F (36.7 C) (Oral)    Resp 18    LMP 02/22/2021 (Exact Date)    SpO2 95%   Physical Exam Vitals and nursing note reviewed.  Constitutional:      General: She is not in acute distress.    Appearance: She is well-developed. She is not diaphoretic.  HENT:     Head: Normocephalic and atraumatic.  Eyes:     Pupils: Pupils are equal, round, and reactive to light.  Cardiovascular:     Rate and Rhythm: Normal rate and regular rhythm.     Heart sounds: No murmur heard.   No friction rub. No gallop.  Pulmonary:     Effort: Pulmonary effort is normal.     Breath sounds: No wheezing or rales.  Abdominal:     General: There is no distension.     Palpations: Abdomen is soft.     Tenderness: There is no abdominal tenderness.  Musculoskeletal:        General: No tenderness.     Cervical back: Normal range of motion and neck supple.  Skin:    General: Skin is warm and dry.  Neurological:     Mental Status: She is alert and oriented to person, place, and time.     Cranial Nerves: Cranial nerves 2-12 are intact.     Sensory: Sensation is intact.     Motor: Motor function is intact.     Coordination: Coordination is intact.     Gait: Gait is intact.     Comments: Benign neuro exam  Psychiatric:         Behavior: Behavior normal.    ED Results / Procedures / Treatments   Labs (all labs ordered are listed, but only abnormal results are displayed) Labs Reviewed - No data to display  EKG None  Radiology No results found.  Procedures Procedures   Medications Ordered in ED Medications  dexamethasone (DECADRON) tablet 10 mg (has no administration in time range)  sodium chloride 0.9 % bolus 1,000 mL (1,000 mLs Intravenous New Bag/Given 03/06/21 1746)  prochlorperazine (COMPAZINE) injection 10 mg (10 mg Intravenous Given 03/06/21 1747)  diphenhydrAMINE (BENADRYL) injection 25 mg (25 mg Intravenous Given 03/06/21 1746)  HYDROmorphone (DILAUDID) injection 1 mg (1 mg Intravenous Given 03/06/21 1751)    ED Course  I have reviewed the triage vital signs and the nursing notes.  Pertinent labs & imaging results that were available during my care of the patient were reviewed by me and considered in my medical decision making (see chart for details).    MDM Rules/Calculators/A&P                         40 yo F with a chief complaints of migraine headache.  Patient has headaches like this all the time.  This 1 did not improve with her home abortive medications.  She has a normal neurologic exam.  Looking at her old visits here she atypically requires narcotics as part of her cocktail.  She actually was prescribed narcotics through her neurologist as part of her  headache therapy.  We will give a dose of Dilaudid as she has received previously.  Compazine Benadryl IV fluids and reassess.  Patient is feeling much better on reassessment.  Will discharge home.  Neurology follow-up.  6:45 PM:  I have discussed the diagnosis/risks/treatment options with the patient and believe the pt to be eligible for discharge home to follow-up with PCP. We also discussed returning to the ED immediately if new or worsening sx occur. We discussed the sx which are most concerning (e.g., sudden worsening pain, fever,  inability to tolerate by mouth) that necessitate immediate return. Medications administered to the patient during their visit and any new prescriptions provided to the patient are listed below.  Medications given during this visit Medications  dexamethasone (DECADRON) tablet 10 mg (has no administration in time range)  sodium chloride 0.9 % bolus 1,000 mL (1,000 mLs Intravenous New Bag/Given 03/06/21 1746)  prochlorperazine (COMPAZINE) injection 10 mg (10 mg Intravenous Given 03/06/21 1747)  diphenhydrAMINE (BENADRYL) injection 25 mg (25 mg Intravenous Given 03/06/21 1746)  HYDROmorphone (DILAUDID) injection 1 mg (1 mg Intravenous Given 03/06/21 1751)     The patient appears reasonably screen and/or stabilized for discharge and I doubt any other medical condition or other [facility] requiring further screening, evaluation, or treatment in the ED at this time prior to discharge.      Final Clinical Impression(s) / ED Diagnoses Final diagnoses:  Migraine without status migrainosus, not intractable, unspecified migraine type    Rx / DC Orders ED Discharge Orders     None        Deno Etienne, DO 03/06/21 1845

## 2021-03-06 NOTE — ED Triage Notes (Addendum)
Left sided migraine since this morning. No relief with home meds. Endorses n/v. Denies head trauma, fever, vision changes.

## 2021-03-09 ENCOUNTER — Ambulatory Visit (INDEPENDENT_AMBULATORY_CARE_PROVIDER_SITE_OTHER): Admitting: Internal Medicine

## 2021-03-09 ENCOUNTER — Encounter: Payer: Self-pay | Admitting: Internal Medicine

## 2021-03-09 VITALS — BP 104/68 | HR 62 | Ht 66.0 in | Wt 266.4 lb

## 2021-03-09 DIAGNOSIS — R7303 Prediabetes: Secondary | ICD-10-CM

## 2021-03-09 DIAGNOSIS — D352 Benign neoplasm of pituitary gland: Secondary | ICD-10-CM

## 2021-03-09 DIAGNOSIS — E781 Pure hyperglyceridemia: Secondary | ICD-10-CM

## 2021-03-09 LAB — HEPATIC FUNCTION PANEL
ALT: 22 U/L (ref 0–35)
AST: 19 U/L (ref 0–37)
Albumin: 4.2 g/dL (ref 3.5–5.2)
Alkaline Phosphatase: 29 U/L — ABNORMAL LOW (ref 39–117)
Bilirubin, Direct: 0.1 mg/dL (ref 0.0–0.3)
Total Bilirubin: 0.5 mg/dL (ref 0.2–1.2)
Total Protein: 6.8 g/dL (ref 6.0–8.3)

## 2021-03-09 LAB — LIPID PANEL
Cholesterol: 102 mg/dL (ref 0–200)
HDL: 31.6 mg/dL — ABNORMAL LOW (ref >39.00)
LDL Cholesterol: 42 mg/dL (ref 0–99)
NonHDL: 70.35
Total CHOL/HDL Ratio: 3
Triglycerides: 142 mg/dL (ref 0.0–149.0)
VLDL: 28.4 mg/dL (ref 0.0–40.0)

## 2021-03-09 LAB — COMPREHENSIVE METABOLIC PANEL
ALT: 22 U/L (ref 0–35)
AST: 19 U/L (ref 0–37)
Albumin: 4.2 g/dL (ref 3.5–5.2)
Alkaline Phosphatase: 29 U/L — ABNORMAL LOW (ref 39–117)
BUN: 12 mg/dL (ref 6–23)
CO2: 25 mEq/L (ref 19–32)
Calcium: 9.6 mg/dL (ref 8.4–10.5)
Chloride: 109 mEq/L (ref 96–112)
Creatinine, Ser: 1.23 mg/dL — ABNORMAL HIGH (ref 0.40–1.20)
GFR: 54.72 mL/min — ABNORMAL LOW (ref >60.00)
Glucose, Bld: 82 mg/dL (ref 70–99)
Potassium: 3.9 mEq/L (ref 3.5–5.1)
Sodium: 140 mEq/L (ref 135–145)
Total Bilirubin: 0.5 mg/dL (ref 0.2–1.2)
Total Protein: 6.8 g/dL (ref 6.0–8.3)

## 2021-03-09 LAB — TSH: TSH: 0.91 u[IU]/mL (ref 0.35–5.50)

## 2021-03-09 LAB — T4, FREE: Free T4: 0.78 ng/dL (ref 0.60–1.60)

## 2021-03-09 LAB — HEMOGLOBIN A1C: Hgb A1c MFr Bld: 5.8 % (ref 4.6–6.5)

## 2021-03-09 NOTE — Patient Instructions (Signed)
-   Continue Cabergoline 0.5 mg, 2 tablets on Mondays, 1 tablet on Wednesdays and 2 tablets on Fridays  - Metformin 500 mg , Twice daily

## 2021-03-09 NOTE — Progress Notes (Signed)
Name: Amber [name]  MRN/ DOB: [id number], 12-04-1980    Age/ Sex: 41 y.o., female     PCP: Shelda Pal, DO   Reason for Endocrinology Evaluation: Hyperprolactinemia      Initial Endocrinology Clinic Visit: 03/02/2016    PATIENT IDENTIFIER: Amber [name] is a 40 y.o., female with a past medical history of Prolactinoma, GERD, Asthma and migraine headaches . She has followed with [facility] since 03/02/2016 for consultative assistance with management of her hyperprolactinemia .   HISTORICAL SUMMARY: The patient was first diagnosed with hyperprolactinemia in 1996 during evaluation for primary amenorrhea ( highest reading 198 ). She is S/P resection of prolactinoma in 2009 [place], [place] due to size 3 cm, per pt she had a sphenoidal sinus penetration. Per pt prolactin levels normalized and menses resumed.  Pt was noted with elevated prolactin levels again 2015 and was started on Cabergoline, imaging have been reported at " no tumor"     Cabergoline was started 2019 Bromocriptine was added to cabergoline 05/2019 by another endocrinologist  On her initial visit with me in 08/2019, we stopped the Bromocriptine and increased cabergoline. Since the dose exceeded 2 mg/ week , we proceed with an echocardiogram , which showed normal valve structure but there was a question about a retro aortic coronary artery. CT angio was normal.     Repeat pituitary MRI in 08/2019 showed regression of pituitary gland     NO FH of pituitary disease  SUBJECTIVE:    Today (03/09/2021):  Amber [name] is here for a follow up on hyperprolactinemia , Prediabetes and pituitary adenoma.   Has regular menstruation , LMP 12/4th, 2022- regular  Denies nipple discharge  No vision changes   She continues with migraine headaches , follows with Dr. Loretta Plume , uses Emg[facility]  Had an ED visit 03/06/2021 for migraine headaches   Denies nausea, vomiting or diarrhea   She is on  Cabergoline 0.5 mg:  Two tabs on Mondays, 1 tablet on wednesday and 2 tabs on Friday  Metformin 500 mg Twice daily    HISTORY:  Past Medical History:  Past Medical History:  Diagnosis Date   Anxiety    Asthma    Depression    GERD (gastroesophageal reflux disease)    History of chicken pox    History of migraine headaches    Pituitary adenoma ([place])    Seizures ([place])    Past Surgical History:  Past Surgical History:  Procedure Laterality Date   APPENDECTOMY     CHOLECYSTECTOMY     Pituiary Tumor     Tumor Removal Pituitary   Social History:  reports that she quit smoking about 5 years ago. Her smoking use included cigarettes. She has a 15.00 pack-year smoking history. She has never used smokeless tobacco. She reports that she does not drink alcohol and does not use drugs. Family History:  Family History  Problem Relation Age of Onset   Diabetes Mother    Migraines Mother    Hyperlipidemia Mother    Bipolar disorder Mother    Irritable bowel syndrome Mother    Diabetes Father    Colon cancer Father    Cancer Father        colon   Hyperlipidemia Father    Hypertension Father    Bipolar disorder Brother    Migraines Sister    Other Neg Hx        pituitary disorder   Esophageal cancer Neg Hx  HOME MEDICATIONS: Allergies as of 03/09/2021       Reactions   Aspirin    Chlorhexidine    Ibuprofen    Iodine    Lunesta [eszopiclone] Other (See Comments)   GI issues   Nsaids Nausea And Vomiting   Shellfish Allergy    Tingle in mouth/throat   Sulfa Antibiotics    Tape Rash   Blisters        Medication List        Accurate as of March 09, 2021 12:54 PM. If you have any questions, ask your nurse or doctor.          acetaminophen 500 MG tablet Commonly known as: TYLENOL Take 500 mg by mouth as needed.   cabergoline 0.5 MG tablet Commonly known as: DOSTINEX Take 1 tablet (0.5 mg total) by mouth as directed. 1 mg twice a week and 0.5 mg once a  week   cyclobenzaprine 10 MG tablet Commonly known as: FLEXERIL Take 0.5-1 tablets (5-10 mg total) by mouth 3 (three) times daily as needed for muscle spasms.   Emgality 120 MG/ML Soaj Generic drug: Galcanezumab-gnlm Inject 120 mg into the skin every 28 (twenty-eight) days.   escitalopram 10 MG tablet Commonly known as: LEXAPRO Take 1 tablet (10 mg total) by mouth daily.   famotidine 20 MG tablet Commonly known as: PEPCID Take 20 mg by mouth 2 (two) times daily.   fenofibrate 145 MG tablet Commonly known as: TRICOR Take 1 tablet (145 mg total) by mouth daily. What changed: Another medication with the same name was removed. Continue taking this medication, and follow the directions you see here. Changed by: Dorita Sciara, MD   hydrOXYzine 25 MG tablet Commonly known as: ATARAX TAKE 1-3 TABLETS (25-75 MG TOTAL) BY MOUTH 3 (THREE) TIMES DAILY AS NEEDED FOR ANXIETY.   lidocaine 5 % Commonly known as: Lidoderm Place 1 patch onto the skin every 12 (twelve) hours. Remove & Discard patch within 12 hours or as directed by MD   metFORMIN 500 MG tablet Commonly known as: GLUCOPHAGE Take 1 tablet (500 mg total) by mouth 2 (two) times daily with a meal.   Nurtec 75 MG Tbdp Generic drug: Rimegepant Sulfate Take 75 mg by mouth as needed (Daily as needed for a Migraine). Maximum 1 tablet in 24 hours.  Quantity 8.   pantoprazole 40 MG tablet Commonly known as: PROTONIX Take 1 tablet (40 mg total) by mouth 2 (two) times daily.   QUEtiapine 300 MG tablet Commonly known as: SEROQUEL TAKE ONE TABLET BY MOUTH AT BEDTIME   Riboflavin 400 MG Caps Take 400 mg by mouth daily.   rosuvastatin 20 MG tablet Commonly known as: Crestor Take 1 tablet (20 mg total) by mouth daily.   topiramate 100 MG tablet Commonly known as: TOPAMAX Take 1 tablet (100 mg total) by mouth 2 (two) times daily.   traMADol 50 MG tablet Commonly known as: ULTRAM Take 1 tablet (50 mg total) by mouth every  6 (six) hours as needed.          OBJECTIVE:   PHYSICAL EXAM: VS: BP 104/68 (BP Location: Left Arm, Patient Position: Sitting, Cuff Size: Large)    Pulse 62    Ht 5\' 6"  (1.676 m)    Wt 266 lb 6.4 oz (120.8 kg)    LMP 02/22/2021 (Exact Date)    SpO2 98%    BMI 43.00 kg/m    EXAM: General: Pt appears well and is in NAD  Neck: General: Supple without adenopathy. Thyroid: Thyroid size normal.  No goiter or nodules appreciated.  Lungs: Clear with good BS bilat with no rales, rhonchi, or wheezes  Heart: Auscultation: RRR.  Abdomen: Normoactive bowel sounds, soft, nontender, without masses or organomegaly palpable  Extremities:  BL LE: No pretibial edema normal ROM and strength.  Mental Status: Judgment, insight: Intact Orientation: Oriented to time, place, and person Mood and affect: No depression, anxiety, or agitation      DATA REVIEWED:      Latest Reference Range & Units 03/09/21 13:06  Sodium 135 - 145 mEq/L 140  Potassium 3.5 - 5.1 mEq/L 3.9  Chloride 96 - 112 mEq/L 109  CO2 19 - 32 mEq/L 25  Glucose 70 - 99 mg/dL 82  BUN 6 - 23 mg/dL 12  Creatinine 0.40 - 1.20 mg/dL 1.23 (H)  Calcium 8.4 - 10.5 mg/dL 9.6  Alkaline Phosphatase 39 - 117 U/L 39 - 117 U/L 29 (L) 29 (L)  Albumin 3.5 - 5.2 g/dL 3.5 - 5.2 g/dL 4.2 4.2  AST 0 - 37 U/L 0 - 37 U/L 19 19  ALT 0 - 35 U/L 0 - 35 U/L 22 22  Total Protein 6.0 - 8.3 g/dL 6.0 - 8.3 g/dL 6.8 6.8  Bilirubin, Direct 0.0 - 0.3 mg/dL 0.1  Total Bilirubin 0.2 - 1.2 mg/dL 0.2 - 1.2 mg/dL 0.5 0.5  GFR >60.00 mL/min 54.72 (L)  Total CHOL/HDL Ratio  3  Cholesterol 0 - 200 mg/dL 102  HDL Cholesterol >39.00 mg/dL 31.60 (L)  LDL (calc) 0 - 99 mg/dL 42  NonHDL  70.35  Triglycerides 0.0 - 149.0 mg/dL 142.0  VLDL 0.0 - 40.0 mg/dL 28.4     Latest Reference Range & Units 03/09/21 13:06  Prolactin ng/mL 34.6 (H)  Glucose 70 - 99 mg/dL 82  Hemoglobin A1C 4.6 - 6.5 % 5.8  TSH 0.35 - 5.50 uIU/mL 0.91  T4,Free(Direct) 0.60 -  1.60 ng/dL 0.78     MRI 08/31/2019  Dedicated pituitary imaging. Gland size appears slightly smaller now compared to 2018 (series 16, image 9 today versus series 17, image 6 previously). No suprasellar mass or mass effect. Infundibulum and hypothalamus appear normal.   Regressed hypoenhancing area in the left inferior and lateral aspect of the gland. Today there is only subtle heterogeneous enhancement on that side (series 7, image 10) encompassing 3-4 mm. The cavernous sinuses remain normal. Normal clivus. No other abnormal enhancement.   IMPRESSION: 1. Regression of pituitary microadenoma since 2018, subtle residual heterogeneity in the left aspect of the gland encompassing 3-4 mm.   2. No new intracranial abnormality. Stable mild nonspecific cerebral white matter signal changes. ASSESSMENT / PLAN / RECOMMENDATIONS:   Hx of Pituitary Macroadenoma , S/P Transphenoidal surgery in 2009:  - Pr pt she had a transphenoidal surgery for a 3 cm pituitary adenoma in 2009 - Her last MRI was in 08/2019 indicating regression  - Labs have been normal to include TSH, ACTH , cortisol and IGF-1 (08/2019)    Hyperprolactinemia :  - Diagnosed in 1996 - Normalization of levels following transphenoidal pituitary resection in 2009 , restarted on cabergoline in 2015 and again in 2019.  - In March 2021, bromocriptine was added to her regimen by previouse endocrinologist, this was discontinued in 08/2019 and increased cabergoline.  - Pt will need echocardiogram in 09/2021  - Pt understands to stop Cabergoline with positive pregnancy test and to notify our office  -Prolactin is slightly elevated but she just  had a recent ED visit for migraine headaches and she was given a cocktail of medication that included Dilaudid as well as taking tramadol, I am not going to make any changes to her cabergoline dose as narcotics can cause short-term elevation of prolactin, we will continue to monitor    Medications    Continue cabergoline 0.5 mg ,a total of  2.5 mg weekly (Two tabs on Mondays, 1 tab on wednesdays and 2 tabs on Fridays)   2. Pre-Diabetes :   -A1c stable at 5.8% -No changes  Continue Metformin 500 mg Twice daily     F/U in 6 months      Signed electronically by: Mack Guise, MD  [facility] Endocrinology  [facility] [address] [place], [place] [address] Phone: [phone] FAX: [phone]      CC: Shelda Pal, [address] [place] [address] Phone: [phone]  Fax: [phone]   Return to Endocrinology clinic as below: Future Appointments  Date Time Provider [place]  03/09/2021  1:00 PM Alyra Patty, Melanie Crazier, MD [id number]  04/30/2021  1:30 PM Pieter Partridge, DO [id number] None

## 2021-03-10 LAB — PROLACTIN: Prolactin: 34.6 ng/mL — ABNORMAL HIGH

## 2021-03-19 ENCOUNTER — Other Ambulatory Visit: Payer: Self-pay | Admitting: Family Medicine

## 2021-03-19 MED ORDER — QUETIAPINE FUMARATE 300 MG PO TABS: 300.0000 mg | ORAL_TABLET | Freq: Every day | ORAL | 10 refills | Status: DC

## 2021-03-23 ENCOUNTER — Encounter: Payer: Self-pay | Admitting: Neurology

## 2021-03-23 ENCOUNTER — Emergency Department (HOSPITAL_BASED_OUTPATIENT_CLINIC_OR_DEPARTMENT_OTHER)
Admission: EM | Admit: 2021-03-23 | Discharge: 2021-03-24 | Disposition: A | Discharge status: Home / Self Care | Attending: Emergency Medicine | Admitting: Emergency Medicine

## 2021-03-23 ENCOUNTER — Other Ambulatory Visit: Payer: Self-pay

## 2021-03-23 ENCOUNTER — Encounter (HOSPITAL_BASED_OUTPATIENT_CLINIC_OR_DEPARTMENT_OTHER): Payer: Self-pay | Admitting: Emergency Medicine

## 2021-03-23 DIAGNOSIS — R519 Headache, unspecified: Secondary | ICD-10-CM | POA: Diagnosis present

## 2021-03-23 DIAGNOSIS — G43909 Migraine, unspecified, not intractable, without status migrainosus: Secondary | ICD-10-CM | POA: Insufficient documentation

## 2021-03-23 DIAGNOSIS — J45909 Unspecified asthma, uncomplicated: Secondary | ICD-10-CM

## 2021-03-23 MED ORDER — DEXAMETHASONE SODIUM PHOSPHATE 10 MG/ML IJ SOLN
10.0000 mg | Freq: Once | INTRAMUSCULAR | Status: AC
Start: 2021-03-23 — End: 2021-03-23
  Administered 2021-03-23: 10 mg via INTRAVENOUS
  Filled 2021-03-23: qty 1

## 2021-03-23 MED ORDER — DIPHENHYDRAMINE HCL 50 MG/ML IJ SOLN
25.0000 mg | Freq: Once | INTRAMUSCULAR | Status: AC
  Administered 2021-03-23: 25 mg via INTRAVENOUS
  Filled 2021-03-23: qty 1

## 2021-03-23 MED ORDER — HYDROMORPHONE HCL 1 MG/ML IJ SOLN
1.0000 mg | Freq: Once | INTRAMUSCULAR | Status: AC
  Administered 2021-03-23: 1 mg via INTRAVENOUS
  Filled 2021-03-23: qty 1

## 2021-03-23 MED ORDER — SODIUM CHLORIDE 0.9 % IV BOLUS
1000.0000 mL | Freq: Once | INTRAVENOUS | Status: AC
  Administered 2021-03-23: 1000 mL via INTRAVENOUS

## 2021-03-23 MED ORDER — METOCLOPRAMIDE HCL 5 MG/ML IJ SOLN
10.0000 mg | Freq: Once | INTRAMUSCULAR | Status: AC
  Administered 2021-03-23: 10 mg via INTRAVENOUS
  Filled 2021-03-23: qty 2

## 2021-03-23 NOTE — ED Triage Notes (Signed)
Pt reports migraine today; was seen here recently for same; pt is out of Emgality inj and was supposed to have it on Sunday

## 2021-03-23 NOTE — ED Provider Notes (Signed)
[facility] EMERGENCY DEPARTMENT Provider Note   CSN: [id number] Arrival date & time: 03/23/21  3546     History  Chief Complaint  Patient presents with   Migraine    Amber [name] is a 41 y.o. female.  Patient with extensive migraine headache history, pituitary adenoma, followed by Dr. Tomi Likens --presents to the emergency department with migraine.  She reports left-sided headache upon awaking this morning.  Typically she receives monthly IM injections for migraine prophylaxis, however has not had her most recent dose.  She tried other home medications without significant improvement.  She had vomiting which typically triggers visit to the emergency department for treatment.  She has been prescribed tramadol in the past.  Headaches are typical.  She reports mild "aura".  No extremity weakness, vision change or vision loss, slurred speech, or difficulty walking.  There is nothing atypical about her current headache.  She denies fevers head injury or trauma. The onset of this condition was acute. The course is constant. Aggravating factors: light exposure. Alleviating factors: none.        Home Medications Prior to Admission medications   Medication Sig Start Date End Date Taking? Authorizing Provider  acetaminophen (TYLENOL) 500 MG tablet Take 500 mg by mouth as needed.    [provider]  cabergoline (DOSTINEX) 0.5 MG tablet Take 1 tablet (0.5 mg total) by mouth as directed. 1 mg twice a week and 0.5 mg once a week 09/03/20   Shamleffer, Melanie Crazier, MD  cyclobenzaprine (FLEXERIL) 10 MG tablet Take 0.5-1 tablets (5-10 mg total) by mouth 3 (three) times daily as needed for muscle spasms. 11/10/20   Shelda Pal, DO  escitalopram (LEXAPRO) 10 MG tablet Take 1 tablet (10 mg total) by mouth daily. 09/02/20   Shelda Pal, DO  famotidine (PEPCID) 20 MG tablet Take 20 mg by mouth 2 (two) times daily.    [provider]  fenofibrate (TRICOR)  145 MG tablet Take 1 tablet (145 mg total) by mouth daily. 12/12/20   Wendling, Crosby Oyster, DO  Galcanezumab-gnlm (EMGALITY) 120 MG/ML SOAJ Inject 120 mg into the skin every 28 (twenty-eight) days. 10/15/20   Tomi Likens, Adam R, DO  hydrOXYzine (ATARAX/VISTARIL) 25 MG tablet TAKE 1-3 TABLETS (25-75 MG TOTAL) BY MOUTH 3 (THREE) TIMES DAILY AS NEEDED FOR ANXIETY. 01/22/18   Shelda Pal, DO  lidocaine (LIDODERM) 5 % Place 1 patch onto the skin every 12 (twelve) hours. Remove & Discard patch within 12 hours or as directed by MD 05/07/20   Rosemarie Ax, MD  metFORMIN (GLUCOPHAGE) 500 MG tablet Take 1 tablet (500 mg total) by mouth 2 (two) times daily with a meal. 09/03/20   Shamleffer, Melanie Crazier, MD  pantoprazole (PROTONIX) 40 MG tablet Take 1 tablet (40 mg total) by mouth 2 (two) times daily. 01/28/21   Shelda Pal, DO  QUEtiapine (SEROQUEL) 300 MG tablet Take 1 tablet (300 mg total) by mouth at bedtime. 03/19/21   Shelda Pal, DO  Riboflavin 400 MG CAPS Take 400 mg by mouth daily.    [provider]  Rimegepant Sulfate (NURTEC) 75 MG TBDP Take 75 mg by mouth as needed (Daily as needed for a Migraine). Maximum 1 tablet in 24 hours.  Quantity 8. 09/02/20   Tomi Likens, Adam R, DO  rosuvastatin (CRESTOR) 20 MG tablet Take 1 tablet (20 mg total) by mouth daily. 10/08/20   Shelda Pal, DO  topiramate (TOPAMAX) 100 MG tablet Take 1 tablet (100  mg total) by mouth 2 (two) times daily. 01/01/21   Pieter Partridge, DO  traMADol (ULTRAM) 50 MG tablet Take 1 tablet (50 mg total) by mouth every 6 (six) hours as needed. 01/29/21   Pieter Partridge, DO      Allergies    Aspirin, Chlorhexidine, Ibuprofen, Iodine, Lunesta [eszopiclone], Nsaids, Shellfish allergy, Sulfa antibiotics, and Tape    Review of Systems   Review of Systems  Constitutional:  Negative for fever.  HENT:  Negative for congestion, dental problem, rhinorrhea and sinus pressure.   Eyes:  Positive for  photophobia. Negative for discharge, redness and visual disturbance.  Respiratory:  Negative for shortness of breath.   Cardiovascular:  Negative for chest pain.  Gastrointestinal:  Positive for nausea and vomiting.  Musculoskeletal:  Negative for gait problem, neck pain and neck stiffness.  Skin:  Negative for rash.  Neurological:  Positive for headaches. Negative for syncope, speech difficulty, weakness, light-headedness and numbness.  Psychiatric/Behavioral:  Negative for confusion.    Physical Exam Updated Vital Signs BP (!) 94/55    Pulse 87    Temp 98.3 F (36.8 C) (Oral)    Resp 20    Ht 5\' 6"  (1.676 m)    Wt 117.9 kg    LMP 02/22/2021 (Exact Date)    SpO2 97%    BMI 41.97 kg/m  Physical Exam Vitals and nursing note reviewed.  Constitutional:      Appearance: She is well-developed.  HENT:     Head: Normocephalic and atraumatic.     Right Ear: Tympanic membrane, ear canal and external ear normal.     Left Ear: Tympanic membrane, ear canal and external ear normal.     Nose: Nose normal.     Mouth/Throat:     Pharynx: Uvula midline.  Eyes:     General: Lids are normal.     Extraocular Movements:     Right eye: No nystagmus.     Left eye: No nystagmus.     Conjunctiva/sclera: Conjunctivae normal.     Pupils: Pupils are equal, round, and reactive to light.  Cardiovascular:     Rate and Rhythm: Normal rate and regular rhythm.  Pulmonary:     Effort: Pulmonary effort is normal.     Breath sounds: Normal breath sounds.  Abdominal:     Palpations: Abdomen is soft.     Tenderness: There is no abdominal tenderness.  Musculoskeletal:     Cervical back: Normal range of motion and neck supple. No tenderness or bony tenderness.  Skin:    General: Skin is warm and dry.  Neurological:     Mental Status: She is alert and oriented to person, place, and time.     GCS: GCS eye subscore is 4. GCS verbal subscore is 5. GCS motor subscore is 6.     Cranial Nerves: No cranial nerve  deficit.     Sensory: No sensory deficit.     Motor: No weakness.     Coordination: Coordination normal.     Gait: Gait normal.     Comments: Upper extremity myotomes tested bilaterally:  C5 Shoulder abduction 5/5 C6 Elbow flexion/wrist extension 5/5 C7 Elbow extension 5/5 C8 Finger flexion 5/5 T1 Finger abduction 5/5  Lower extremity myotomes tested bilaterally: L2 Hip flexion 5/5 L3 Knee extension 5/5 L4 Ankle dorsiflexion 5/5 S1 Ankle plantar flexion 5/5     ED Results / Procedures / Treatments   Labs (all labs ordered are listed, but only abnormal  results are displayed) Labs Reviewed - No data to display  EKG None  Radiology No results found.  Procedures Procedures    Medications Ordered in ED Medications  metoCLOPramide (REGLAN) injection 10 mg (10 mg Intravenous Given 03/23/21 2309)  diphenhydrAMINE (BENADRYL) injection 25 mg (25 mg Intravenous Given 03/23/21 2309)  dexamethasone (DECADRON) injection 10 mg (10 mg Intravenous Given 03/23/21 2309)  HYDROmorphone (DILAUDID) injection 1 mg (1 mg Intravenous Given 03/23/21 2309)  sodium chloride 0.9 % bolus 1,000 mL (1,000 mLs Intravenous New Bag/Given 03/23/21 2308)    ED Course/ Medical Decision Making/ A&P    Patient seen and examined. Plan discussed with patient.   Labs: None required  Imaging: None required  Medications/Fluids: We will give IV Reglan, Benadryl, dexamethasone, Dilaudid.  Patient will be given IV fluid bolus.  After completion, she will be discharged home to rest.  She is in agreement with plan.  Vital signs reviewed and are as follows: BP (!) 94/55    Pulse 87    Temp 98.3 F (36.8 C) (Oral)    Resp 20    Ht 5\' 6"  (1.676 m)    Wt 117.9 kg    LMP 02/22/2021 (Exact Date)    SpO2 97%    BMI 41.97 kg/m   Initial impression: Migraine headache                          Medical Decision Making  Patient with typical migraine headache.  Patient without high-risk features of headache including: sudden  onset/thunderclap HA, no similar headache in past, altered mental status, accompanying seizure, headache with exertion, age > 68, history of immunocompromise, neck or shoulder pain, fever, use of anticoagulation, family history of spontaneous SAH, concomitant drug use, toxic exposure.   Patient has a normal complete neurological exam, normal vital signs, normal level of consciousness, no signs of meningismus, is well-appearing/non-toxic appearing, no signs of trauma.   Imaging with CT/MRI not indicated given history and physical exam findings.   No dangerous or life-threatening conditions suspected or identified by history, physical exam, and by work-up. No indications for hospitalization identified.            Final Clinical Impression(s) / ED Diagnoses Final diagnoses:  Migraine without status migrainosus, not intractable, unspecified migraine type    Rx / DC Orders ED Discharge Orders     None         Carlisle Cater, PA-C 03/24/21 0010    Quintella Reichert, MD 03/24/21 [phone]

## 2021-03-24 ENCOUNTER — Other Ambulatory Visit: Payer: Self-pay | Admitting: Neurology

## 2021-03-24 MED ORDER — EMGALITY 120 MG/ML ~~LOC~~ SOAJ: 120.0000 mg | SUBCUTANEOUS | 5 refills | Status: DC

## 2021-03-24 NOTE — Discharge Instructions (Signed)
Please read and follow all provided instructions.  Your diagnoses today include:  1. Migraine without status migrainosus, not intractable, unspecified migraine type     Tests performed today include: Vital signs. See below for your results today.   Medications:  In the Emergency Department you received: Reglan - antinausea/headache medication Benadryl - antihistamine to counteract potential side effects of reglan Dexamethasone - steroid medication  Take any prescribed medications only as directed.  Additional information:  Follow any educational materials contained in this packet.  You are having a headache. No specific cause was found today for your headache. It may have been a migraine or other cause of headache. Stress, anxiety, fatigue, and depression are common triggers for headaches.   Your headache today does not appear to be life-threatening or require hospitalization, but often the exact cause of headaches is not determined in the emergency department. Therefore, follow-up with your doctor is very important to find out what may have caused your headache and whether or not you need any further diagnostic testing or treatment.   Sometimes headaches can appear benign (not harmful), but then more serious symptoms can develop which should prompt an immediate re-evaluation by your doctor or the emergency department.  BE VERY CAREFUL not to take multiple medicines containing Tylenol (also called acetaminophen). Doing so can lead to an overdose which can damage your liver and cause liver failure and possibly death.   Follow-up instructions: Please follow-up with your primary care provider in the next 3 days for further evaluation of your symptoms.   Return instructions:  Please return to the Emergency Department if you experience worsening symptoms. Return if the medications do not resolve your headache, if it recurs, or if you have multiple episodes of vomiting or cannot keep down  fluids. Return if you have a change from the usual headache. RETURN IMMEDIATELY IF you: Develop a sudden, severe headache Develop confusion or become poorly responsive or faint Develop a fever above 100.50F or problem breathing Have a change in speech, vision, swallowing, or understanding Develop new weakness, numbness, tingling, incoordination in your arms or legs Have a seizure Please return if you have any other emergent concerns.  Additional Information:  Your vital signs today were: BP (!) 94/55    Pulse 87    Temp 98.3 F (36.8 C) (Oral)    Resp 20    Ht 5\' 6"  (1.676 m)    Wt 117.9 kg    LMP 02/22/2021 (Exact Date)    SpO2 97%    BMI 41.97 kg/m  If your blood pressure (BP) was elevated above 135/85 this visit, please have this repeated by your doctor within one month. --------------

## 2021-04-13 ENCOUNTER — Encounter (HOSPITAL_BASED_OUTPATIENT_CLINIC_OR_DEPARTMENT_OTHER): Payer: Self-pay | Admitting: Urology

## 2021-04-13 ENCOUNTER — Other Ambulatory Visit: Payer: Self-pay

## 2021-04-13 ENCOUNTER — Emergency Department (HOSPITAL_BASED_OUTPATIENT_CLINIC_OR_DEPARTMENT_OTHER)
Admission: EM | Admit: 2021-04-13 | Discharge: 2021-04-13 | Disposition: A | Discharge status: Home / Self Care | Attending: Emergency Medicine | Admitting: Emergency Medicine

## 2021-04-13 DIAGNOSIS — G43909 Migraine, unspecified, not intractable, without status migrainosus: Secondary | ICD-10-CM | POA: Insufficient documentation

## 2021-04-13 DIAGNOSIS — Z7984 Long term (current) use of oral hypoglycemic drugs: Secondary | ICD-10-CM | POA: Diagnosis not present

## 2021-04-13 DIAGNOSIS — Z79899 Other long term (current) drug therapy: Secondary | ICD-10-CM | POA: Insufficient documentation

## 2021-04-13 MED ORDER — SODIUM CHLORIDE 0.9 % IV BOLUS
500.0000 mL | Freq: Once | INTRAVENOUS | Status: AC
  Administered 2021-04-13: 22:00:00 500 mL via INTRAVENOUS

## 2021-04-13 MED ORDER — HYDROMORPHONE HCL 1 MG/ML IJ SOLN
1.0000 mg | Freq: Once | INTRAMUSCULAR | Status: AC
  Administered 2021-04-13: 22:00:00 1 mg via INTRAVENOUS
  Filled 2021-04-13: qty 1

## 2021-04-13 MED ORDER — METOCLOPRAMIDE HCL 5 MG/ML IJ SOLN
10.0000 mg | Freq: Once | INTRAMUSCULAR | Status: AC
Start: 2021-04-13 — End: 2021-04-13
  Administered 2021-04-13: 22:00:00 10 mg via INTRAVENOUS
  Filled 2021-04-13: qty 2

## 2021-04-13 MED ORDER — DIPHENHYDRAMINE HCL 50 MG/ML IJ SOLN
25.0000 mg | Freq: Once | INTRAMUSCULAR | Status: AC
  Administered 2021-04-13: 22:00:00 25 mg via INTRAVENOUS
  Filled 2021-04-13: qty 1

## 2021-04-13 NOTE — Discharge Instructions (Signed)
Follow-up with your neurologist.  Come back to ER as needed.

## 2021-04-13 NOTE — ED Triage Notes (Signed)
Migraine that started today, worsening this evening with nausea and vomiting Out of Nurtec medication Sensitivity to light H/o same

## 2021-04-13 NOTE — ED Provider Notes (Signed)
[facility] Provider Note   CSN: [id number] Arrival date & time: 04/13/21  2105     History  Chief Complaint  Patient presents with   Migraine    Amber [name] is a 41 y.o. female.  Presented to the emergency room with concern for migraine.  Reports that she has a long history of migraine, migraine today is similar to prior.  No changes.  Not sudden onset, not worsening good relief.  Frontal, 8 out of 10 in severity, aching, throbbing.  Does endorse photo sensitivity.  No neck pain or stiffness.  No numbness, weakness, speech or vision change.  Reports that she ran out of her Nurtec.  Is supposed to get more tomorrow in the mail.  Follows with Dr. Tomi Likens.  Completed chart review, reviewed neurology note in 09/2020, history of hemiplegic migraine, cluster headaches.  HPI     Home Medications Prior to Admission medications   Medication Sig Start Date End Date Taking? Authorizing Provider  acetaminophen (TYLENOL) 500 MG tablet Take 500 mg by mouth as needed.    [provider]  cabergoline (DOSTINEX) 0.5 MG tablet Take 1 tablet (0.5 mg total) by mouth as directed. 1 mg twice a week and 0.5 mg once a week 09/03/20   Shamleffer, Melanie Crazier, MD  cyclobenzaprine (FLEXERIL) 10 MG tablet Take 0.5-1 tablets (5-10 mg total) by mouth 3 (three) times daily as needed for muscle spasms. 11/10/20   Shelda Pal, DO  escitalopram (LEXAPRO) 10 MG tablet Take 1 tablet (10 mg total) by mouth daily. 09/02/20   Shelda Pal, DO  famotidine (PEPCID) 20 MG tablet Take 20 mg by mouth 2 (two) times daily.    [provider]  fenofibrate (TRICOR) 145 MG tablet Take 1 tablet (145 mg total) by mouth daily. 12/12/20   Wendling, Crosby Oyster, DO  Galcanezumab-gnlm (EMGALITY) 120 MG/ML SOAJ Inject 120 mg into the skin every 28 (twenty-eight) days. 03/24/21   Tomi Likens, Adam R, DO  hydrOXYzine (ATARAX/VISTARIL) 25 MG tablet TAKE 1-3 TABLETS  (25-75 MG TOTAL) BY MOUTH 3 (THREE) TIMES DAILY AS NEEDED FOR ANXIETY. 01/22/18   Shelda Pal, DO  lidocaine (LIDODERM) 5 % Place 1 patch onto the skin every 12 (twelve) hours. Remove & Discard patch within 12 hours or as directed by MD 05/07/20   Rosemarie Ax, MD  metFORMIN (GLUCOPHAGE) 500 MG tablet Take 1 tablet (500 mg total) by mouth 2 (two) times daily with a meal. 09/03/20   Shamleffer, Melanie Crazier, MD  pantoprazole (PROTONIX) 40 MG tablet Take 1 tablet (40 mg total) by mouth 2 (two) times daily. 01/28/21   Shelda Pal, DO  QUEtiapine (SEROQUEL) 300 MG tablet Take 1 tablet (300 mg total) by mouth at bedtime. 03/19/21   Shelda Pal, DO  Riboflavin 400 MG CAPS Take 400 mg by mouth daily.    [provider]  Rimegepant Sulfate (NURTEC) 75 MG TBDP Take 75 mg by mouth as needed (Daily as needed for a Migraine). Maximum 1 tablet in 24 hours.  Quantity 8. 09/02/20   Tomi Likens, Adam R, DO  rosuvastatin (CRESTOR) 20 MG tablet Take 1 tablet (20 mg total) by mouth daily. 10/08/20   Shelda Pal, DO  topiramate (TOPAMAX) 100 MG tablet Take 1 tablet (100 mg total) by mouth 2 (two) times daily. 01/01/21   Pieter Partridge, DO  traMADol (ULTRAM) 50 MG tablet Take 1 tablet (50 mg total) by mouth every 6 (six) hours  as needed. 01/29/21   Pieter Partridge, DO      Allergies    Aspirin, Chlorhexidine, Ibuprofen, Iodine, Lunesta [eszopiclone], Nsaids, Shellfish allergy, Sulfa antibiotics, and Tape    Review of Systems   Review of Systems  Neurological:  Positive for headaches.  All other systems reviewed and are negative.  Physical Exam Updated Vital Signs BP 116/75 (BP Location: Right Arm)    Pulse 82    Temp 98.3 F (36.8 C) (Oral)    Resp 18    Ht 5\' 6"  (1.676 m)    Wt 113.4 kg    LMP 03/24/2021 (Exact Date)    SpO2 99%    BMI 40.35 kg/m  Physical Exam Vitals and nursing note reviewed.  Constitutional:      General: She is not in acute distress.     Appearance: She is well-developed.  HENT:     Head: Normocephalic and atraumatic.  Eyes:     Conjunctiva/sclera: Conjunctivae normal.  Cardiovascular:     Rate and Rhythm: Normal rate and regular rhythm.     Heart sounds: No murmur heard. Pulmonary:     Effort: Pulmonary effort is normal. No respiratory distress.     Breath sounds: Normal breath sounds.  Abdominal:     Palpations: Abdomen is soft.     Tenderness: There is no abdominal tenderness.  Musculoskeletal:        General: No swelling.     Cervical back: Neck supple.  Skin:    General: Skin is warm and dry.     Capillary Refill: Capillary refill takes less than 2 seconds.  Neurological:     General: No focal deficit present.     Mental Status: She is alert and oriented to person, place, and time.     Motor: No weakness.     Gait: Gait normal.  Psychiatric:        Mood and Affect: Mood normal.    ED Results / Procedures / Treatments   Labs (all labs ordered are listed, but only abnormal results are displayed) Labs Reviewed - No data to display  EKG None  Radiology No results found.  Procedures Procedures    Medications Ordered in ED Medications  HYDROmorphone (DILAUDID) injection 1 mg (1 mg Intravenous Given 04/13/21 2204)  metoCLOPramide (REGLAN) injection 10 mg (10 mg Intravenous Given 04/13/21 2206)  diphenhydrAMINE (BENADRYL) injection 25 mg (25 mg Intravenous Given 04/13/21 2210)  sodium chloride 0.9 % bolus 500 mL (500 mLs Intravenous New Bag/Given 04/13/21 2203)    ED Course/ Medical Decision Making/ A&P                           Medical Decision Making Risk Prescription drug management.   41 year old lady presenting to the emergency room with concern for headache.  Similar to prior migraines.  Has quite extensive history of migraines and follows closely with neurology.  Provided patient with headache cocktail.  She had complete resolution of her symptoms.  Given no change from regular migraine, no  neurologic complaints or deficit on exam, do not feel CT head indicated today.  Given her pain is well controlled and she is well-appearing with normal vitals, believe she is appropriate for outpatient management and does not require hospitalization today.  Will discharge home.  Note additional history obtained from chart review, reviewed neurology note in 09/2020, history of hemiplegic migraine, cluster headaches.          Final  Clinical Impression(s) / ED Diagnoses Final diagnoses:  Migraine without status migrainosus, not intractable, unspecified migraine type    Rx / DC Orders ED Discharge Orders     None         Lucrezia Starch, MD 04/13/21 2247

## 2021-04-16 ENCOUNTER — Other Ambulatory Visit: Payer: Self-pay | Admitting: Family Medicine

## 2021-04-21 ENCOUNTER — Ambulatory Visit (INDEPENDENT_AMBULATORY_CARE_PROVIDER_SITE_OTHER): Admitting: Obstetrics & Gynecology

## 2021-04-21 ENCOUNTER — Other Ambulatory Visit: Payer: Self-pay

## 2021-04-21 ENCOUNTER — Encounter: Payer: Self-pay | Admitting: Obstetrics & Gynecology

## 2021-04-21 VITALS — BP 104/65 | HR 98 | Wt 267.0 lb

## 2021-04-21 DIAGNOSIS — N751 Abscess of Bartholin's gland: Secondary | ICD-10-CM | POA: Diagnosis not present

## 2021-04-21 NOTE — Patient Instructions (Signed)
Bartholin's Cyst A Bartholin's cyst is a fluid-filled sac that forms as a result of a blockage along the tube (duct) of the Bartholin's gland. Bartholin's glands are small glands in the folds of skin around the vaginal opening (labia). These glands produce fluid to moisten or lubricate the outside of the vagina during sex. A cyst that is not large or infected may not cause any problems or require treatment. If the cyst gets infected with bacteria, it is called a Bartholin's abscess. An abscess may cause symptoms such as pain and swelling and is more likely to require treatment. What are the causes? This condition may be caused by a blocked Bartholin's gland duct. These ducts can become blocked due to natural buildup of fluid and oils. Bacteria inside of the cyst can cause infection. In many cases, the cause is not known. What are the signs or symptoms? Symptoms may include: A bulge or lump on the labia, near the lower opening of the vagina. Discomfort or pain. This may get worse during sex or when walking. Redness, swelling, or fluid draining from the area. These may be signs of an abscess. How severe your symptoms are depends on the size of your cyst and whether it is infected. Infection causes symptoms to get more severe. How is this diagnosed? This condition may be diagnosed based on: Your symptoms and medical history. A physical exam to check for swelling in your vaginal area. You may lie on your back on an exam table and have your feet placed into footrests for the exam. Blood tests to check for infections. Removal of a fluid sample from the cyst or abscess (biopsy) for testing. You may work with a health care provider who specializes in [employer] health (gynecologist) for diagnosis and treatment. How is this treated? If your cyst is small, not infected, and not causing symptoms, you may not need treatment. These cysts often go away on their own, with home care such as hot baths or warm  compresses. If you have a large cyst or an abscess, treatment may include: Antibiotic medicine. A procedure to drain the fluid inside the cyst or abscess. These procedures involve making an incision in the cyst or abscess so that the fluid drains out, and then one of the following may be done: A small, thin tube (catheter) may be placed inside the cyst or abscess so that it does not close and fill up with fluid again (fistulization). The catheter will be removed at a follow-up visit. The edges of the incision may be stitched to your skin so that the cyst or abscess stays open (marsupialization). This allows it to continue to drain and not fill up with fluid again. If you have cysts or abscesses that keep returning (recurring) and have required incision and drainage multiple times, your health care provider may talk with you about surgery to remove the Bartholin's gland. Follow these instructions at home: Medicines Take over-the-counter and prescription medicines only as told by your health care provider. If you were prescribed an antibiotic medicine, take it as told by your health care provider. Do not stop taking the antibiotic even if your condition improves. Managing pain and swelling Try sitz baths to help with pain and swelling. A sitz bath is a warm water bath in which the water only comes up to your hips and should cover your buttocks. You may take sitz baths several times a day. Apply heat to the affected area as often as needed. Use the heat source  that your health care provider recommends, such as a moist heat pack or a heating pad. Place a towel between your skin and the heat source. Leave the heat on for 20-30 minutes. Remove the heat if your skin turns bright red. This is especially important if you are unable to feel pain, heat, or cold. You may have a greater risk of getting burned. Do not fall asleep with the heating pad in place. General instructions If your cyst or abscess was  drained, follow instructions from your health care provider about how to take care of your wound. Use feminine pads as needed to absorb any drainage. Do not push on or squeeze your cyst. Do not have sex until the cyst has gone away or your wound from drainage has healed. Take these steps to help prevent a Bartholin's cyst from returning and to prevent other Bartholin's cysts from developing: Take a bath or shower once a day. Clean your vaginal area with mild soap and water when you bathe. Practice safe sex to prevent STIs. Talk with your health care provider about how to prevent STIs and which forms of birth control (contraception) may be best for you. Keep all follow-up visits. This is important. Contact a health care provider if: You have a fever. You develop increasing redness, swelling, or pain around your cyst. You have fluid, blood, pus, or a bad smell coming from your cyst. You have a cyst that gets larger or comes back. Summary A Bartholin's cyst is a fluid-filled sac that forms as a result of a blockage along the duct of the Bartholin's gland. If your cyst is small, not infected, and not causing symptoms, you may not need any treatment. If you have a large cyst or an abscess, your health care provider may perform a procedure to drain the fluid. If you have cysts or abscesses that keep returning (recurring) and have required incision and drainage multiple times, your health care provider may talk with you about surgery to remove the Bartholin's gland. This information is not intended to replace advice given to you by your health care provider. Make sure you discuss any questions you have with your health care provider. Document Revised: 08/05/2019 Document Reviewed: 08/05/2019 Elsevier Patient Education  [employer].

## 2021-04-21 NOTE — Progress Notes (Signed)
History:  41 y.o. G0P0000 here today for eval of painful mass on vulva. She read online and thinks that it is a Bartholin's cyst. It has been present for 3-5 days. It is painful but, is not draining.      The following portions of the patient's history were reviewed and updated as appropriate: allergies, current medications, past family history, past medical history, past social history, past surgical history and problem list.  Review of Systems:  Pertinent items are noted in HPI.    Objective:  Physical Exam Blood pressure 104/65, pulse 98, weight 267 lb (121.1 kg), last menstrual period 03/24/2021.  CONSTITUTIONAL: Well-developed, well-nourished female in no acute distress.  HENT:  Normocephalic, atraumatic EYES: Conjunctivae and EOM are normal. No scleral icterus.  NECK: Normal range of motion SKIN: Skin is warm and dry. No rash noted. Not diaphoretic.No pallor. [place]: Alert and oriented to person, place, and time. Normal coordination.  Pelvic: Normal appearing external genitalia with the exception of a 2cm cystic structure on the right introitus.  This is tender to palpation but, it is not pointing. There is no erythema.    Assessment & Plan:  Diagnoses and all orders for this visit:  Bartholin's gland abscess   Rec f/u in 1 week for eval or sooner prn  Warm compresses to the cyst/vulvar  Reviewed care of the cyst/abscess  Total face-to-face time with patient, review of chart, discussion with consultant and coordination of care was 67min.    Eugena Rhue L. Harraway-Smith, M.D., Cherlynn June

## 2021-04-21 NOTE — Progress Notes (Signed)
Patient thinks she has bartholins gland swelling. Patient has tried compresses with no success. Patient has noticed it since last Wednesday. Kathrene Alu RN

## 2021-04-22 ENCOUNTER — Other Ambulatory Visit: Payer: Self-pay

## 2021-04-22 ENCOUNTER — Encounter (HOSPITAL_BASED_OUTPATIENT_CLINIC_OR_DEPARTMENT_OTHER): Payer: Self-pay | Admitting: Emergency Medicine

## 2021-04-22 DIAGNOSIS — Z79899 Other long term (current) drug therapy: Secondary | ICD-10-CM | POA: Diagnosis not present

## 2021-04-22 DIAGNOSIS — G43809 Other migraine, not intractable, without status migrainosus: Secondary | ICD-10-CM | POA: Insufficient documentation

## 2021-04-22 DIAGNOSIS — J45909 Unspecified asthma, uncomplicated: Secondary | ICD-10-CM | POA: Diagnosis not present

## 2021-04-22 DIAGNOSIS — Z7984 Long term (current) use of oral hypoglycemic drugs: Secondary | ICD-10-CM | POA: Diagnosis not present

## 2021-04-22 DIAGNOSIS — G43909 Migraine, unspecified, not intractable, without status migrainosus: Secondary | ICD-10-CM | POA: Diagnosis present

## 2021-04-22 NOTE — ED Triage Notes (Signed)
Migraine X6 hours Hx of same.

## 2021-04-23 ENCOUNTER — Encounter: Payer: Self-pay | Admitting: Family Medicine

## 2021-04-23 ENCOUNTER — Encounter: Payer: Self-pay | Admitting: Neurology

## 2021-04-23 ENCOUNTER — Emergency Department (HOSPITAL_BASED_OUTPATIENT_CLINIC_OR_DEPARTMENT_OTHER)
Admission: EM | Admit: 2021-04-23 | Discharge: 2021-04-23 | Disposition: A | Discharge status: Home / Self Care | Attending: Emergency Medicine | Admitting: Emergency Medicine

## 2021-04-23 ENCOUNTER — Other Ambulatory Visit: Payer: Self-pay

## 2021-04-23 ENCOUNTER — Encounter (HOSPITAL_BASED_OUTPATIENT_CLINIC_OR_DEPARTMENT_OTHER): Payer: Self-pay | Admitting: Emergency Medicine

## 2021-04-23 DIAGNOSIS — G43109 Migraine with aura, not intractable, without status migrainosus: Secondary | ICD-10-CM

## 2021-04-23 LAB — PREGNANCY, URINE: Preg Test, Ur: NEGATIVE

## 2021-04-23 MED ORDER — BUTALBITAL-APAP-CAFFEINE 50-325-40 MG PO TABS: 1.0000 | ORAL_TABLET | ORAL | Status: DC

## 2021-04-23 MED ORDER — NURTEC 75 MG PO TBDP: 75.0000 mg | ORAL_TABLET | ORAL | 0 refills | Status: DC | PRN

## 2021-04-23 MED ORDER — SODIUM CHLORIDE 0.9 % IV SOLN: INTRAVENOUS | Status: DC | PRN

## 2021-04-23 MED ORDER — DEXAMETHASONE SODIUM PHOSPHATE 4 MG/ML IJ SOLN
4.0000 mg | Freq: Once | INTRAMUSCULAR | Status: AC
  Administered 2021-04-23: 4 mg via INTRAVENOUS
  Filled 2021-04-23: qty 1

## 2021-04-23 MED ORDER — QUETIAPINE FUMARATE 300 MG PO TABS: 300.0000 mg | ORAL_TABLET | Freq: Every day | ORAL | 3 refills | Status: DC

## 2021-04-23 MED ORDER — PROMETHAZINE HCL 25 MG/ML IJ SOLN
INTRAMUSCULAR | Status: AC
  Filled 2021-04-23: qty 1

## 2021-04-23 MED ORDER — METOCLOPRAMIDE HCL 5 MG/ML IJ SOLN
10.0000 mg | Freq: Once | INTRAMUSCULAR | Status: AC
  Administered 2021-04-23: 10 mg via INTRAVENOUS
  Filled 2021-04-23: qty 2

## 2021-04-23 MED ORDER — MAGNESIUM SULFATE 2 GM/50ML IV SOLN
2.0000 g | Freq: Once | INTRAVENOUS | Status: AC
  Administered 2021-04-23: 2 g via INTRAVENOUS
  Filled 2021-04-23: qty 50

## 2021-04-23 MED ORDER — DIPHENHYDRAMINE HCL 50 MG/ML IJ SOLN
25.0000 mg | Freq: Once | INTRAMUSCULAR | Status: AC
  Administered 2021-04-23: 25 mg via INTRAVENOUS
  Filled 2021-04-23: qty 1

## 2021-04-23 MED ORDER — SODIUM CHLORIDE 0.9 % IV SOLN
12.5000 mg | Freq: Four times a day (QID) | INTRAVENOUS | Status: DC | PRN
  Filled 2021-04-23: qty 0.5

## 2021-04-23 MED ORDER — SUMATRIPTAN SUCCINATE 6 MG/0.5ML ~~LOC~~ SOLN
6.0000 mg | Freq: Once | SUBCUTANEOUS | Status: DC
  Filled 2021-04-23: qty 0.5

## 2021-04-23 NOTE — Telephone Encounter (Signed)
Per Pt Need refills of nurtec to last her until her appt 04/30/21

## 2021-04-23 NOTE — ED Provider Notes (Addendum)
[facility] Provider Note   CSN: [id number] Arrival date & time: 04/22/21  2313     History  Chief Complaint  Patient presents with   Migraine    Amber [name] is a 41 y.o. female.  The history is provided by the patient.  Migraine This is a recurrent problem. The current episode started 6 to 12 hours ago. The problem occurs constantly. The problem has not changed since onset.Associated symptoms include headaches. Pertinent negatives include no chest pain, no abdominal pain and no shortness of breath. Nothing aggravates the symptoms. Treatments tried: nurtec. The treatment provided no relief.  Patient with recurrent migraines presents with headache for 6 hours not responsive to nurtec.  Pain is in the left temporal and is classic for her typical migraine.  No f/c/r.  No atypical features.  No focal neuro deficits.    Past Medical History:  Diagnosis Date   Anxiety    Asthma    Depression    GERD (gastroesophageal reflux disease)    History of chicken pox    History of migraine headaches    Pituitary adenoma ([place])    Seizures ([employer])        Home Medications Prior to Admission medications   Medication Sig Start Date End Date Taking? Authorizing Provider  acetaminophen (TYLENOL) 500 MG tablet Take 500 mg by mouth as needed.    [provider]  cabergoline (DOSTINEX) 0.5 MG tablet Take 1 tablet (0.5 mg total) by mouth as directed. 1 mg twice a week and 0.5 mg once a week 09/03/20   Shamleffer, Melanie Crazier, MD  cyclobenzaprine (FLEXERIL) 10 MG tablet Take 0.5-1 tablets (5-10 mg total) by mouth 3 (three) times daily as needed for muscle spasms. 11/10/20   Shelda Pal, DO  escitalopram (LEXAPRO) 10 MG tablet Take 1 tablet (10 mg total) by mouth daily. 09/02/20   Shelda Pal, DO  famotidine (PEPCID) 20 MG tablet Take 20 mg by mouth 2 (two) times daily.    [provider]  fenofibrate (TRICOR) 145 MG tablet  Take 1 tablet (145 mg total) by mouth daily. 12/12/20   Wendling, Crosby Oyster, DO  Galcanezumab-gnlm (EMGALITY) 120 MG/ML SOAJ Inject 120 mg into the skin every 28 (twenty-eight) days. 03/24/21   Tomi Likens, Adam R, DO  hydrOXYzine (ATARAX/VISTARIL) 25 MG tablet TAKE 1-3 TABLETS (25-75 MG TOTAL) BY MOUTH 3 (THREE) TIMES DAILY AS NEEDED FOR ANXIETY. 01/22/18   Shelda Pal, DO  lidocaine (LIDODERM) 5 % Place 1 patch onto the skin every 12 (twelve) hours. Remove & Discard patch within 12 hours or as directed by MD 05/07/20   Rosemarie Ax, MD  metFORMIN (GLUCOPHAGE) 500 MG tablet Take 1 tablet (500 mg total) by mouth 2 (two) times daily with a meal. 09/03/20   Shamleffer, Melanie Crazier, MD  pantoprazole (PROTONIX) 40 MG tablet Take 1 tablet (40 mg total) by mouth 2 (two) times daily. 01/28/21   Shelda Pal, DO  QUEtiapine (SEROQUEL) 300 MG tablet Take 1 tablet (300 mg total) by mouth at bedtime. 03/19/21   Shelda Pal, DO  Riboflavin 400 MG CAPS Take 400 mg by mouth daily.    [provider]  Rimegepant Sulfate (NURTEC) 75 MG TBDP Take 75 mg by mouth as needed (Daily as needed for a Migraine). Maximum 1 tablet in 24 hours.  Quantity 8. 09/02/20   Tomi Likens, Adam R, DO  rosuvastatin (CRESTOR) 20 MG tablet Take 1 tablet (20 mg total)  by mouth daily. 10/08/20   Shelda Pal, DO  topiramate (TOPAMAX) 100 MG tablet Take 1 tablet (100 mg total) by mouth 2 (two) times daily. 01/01/21   Pieter Partridge, DO  traMADol (ULTRAM) 50 MG tablet Take 1 tablet (50 mg total) by mouth every 6 (six) hours as needed. 01/29/21   Pieter Partridge, DO      Allergies    Aspirin, Chlorhexidine, Ibuprofen, Iodine, Lunesta [eszopiclone], Nsaids, Shellfish allergy, Sulfa antibiotics, Triptans, and Tape    Review of Systems   Review of Systems  Constitutional:  Negative for fever.  HENT:  Negative for congestion.   Eyes:  Negative for redness and visual disturbance.  Respiratory:   Negative for shortness of breath.   Cardiovascular:  Negative for chest pain.  Gastrointestinal:  Negative for abdominal pain.  Musculoskeletal:  Negative for neck stiffness.  Skin:  Negative for rash.  Neurological:  Positive for headaches. Negative for dizziness, speech difficulty and numbness.  Psychiatric/Behavioral:  Negative for confusion.   All other systems reviewed and are negative.  Physical Exam Updated Vital Signs BP 95/60    Pulse 84    Temp 98.1 F (36.7 C) (Oral)    Resp 16    Ht 5\' 6"  (1.676 m)    Wt 122.5 kg    LMP 04/21/2021 (Exact Date)    SpO2 97%    BMI 43.58 kg/m  Physical Exam Vitals and nursing note reviewed. Exam conducted with a chaperone present.  Constitutional:      General: She is not in acute distress.    Appearance: Normal appearance.     Comments: Smiling in room with all the lights on  HENT:     Head: Normocephalic and atraumatic.     Comments: No temporal bulge or tenderness     Nose: Nose normal.  Eyes:     Extraocular Movements: Extraocular movements intact.     Conjunctiva/sclera: Conjunctivae normal.     Pupils: Pupils are equal, round, and reactive to light.     Comments: No proptosis, intact cognition, disk margins sharp   Cardiovascular:     Rate and Rhythm: Normal rate and regular rhythm.  Pulmonary:     Effort: Pulmonary effort is normal.     Breath sounds: Normal breath sounds.  Abdominal:     General: Bowel sounds are normal.     Palpations: Abdomen is soft.     Tenderness: There is no abdominal tenderness. There is no guarding.  Musculoskeletal:        General: Normal range of motion.     Cervical back: Normal range of motion and neck supple.  Lymphadenopathy:     Cervical: No cervical adenopathy.  Skin:    General: Skin is warm and dry.     Capillary Refill: Capillary refill takes less than 2 seconds.  Neurological:     General: No focal deficit present.     Mental Status: She is alert and oriented to person, place, and  time.     Deep Tendon Reflexes: Reflexes normal.  Psychiatric:        Mood and Affect: Mood normal.        Behavior: Behavior normal.    ED Results / Procedures / Treatments   Labs (all labs ordered are listed, but only abnormal results are displayed) Results for orders placed or performed during the hospital encounter of 04/23/21  Pregnancy, urine  Result Value Ref Range   Preg Test, Ur NEGATIVE NEGATIVE  No results found.  Radiology No results found.   Medications Ordered in ED Medications  0.9 %  sodium chloride infusion ( Intravenous New Bag/Given 04/23/21 0136)  promethazine (PHENERGAN) 12.5 mg in sodium chloride 0.9 % 50 mL IVPB (has no administration in time range)  SUMAtriptan (IMITREX) injection 6 mg (6 mg Subcutaneous Not Given 04/23/21 0320)  butalbital-acetaminophen-caffeine (FIORICET) 50-325-40 MG per tablet 1 tablet (has no administration in time range)  magnesium sulfate IVPB 2 g 50 mL (0 g Intravenous Stopped 04/23/21 0211)  metoCLOPramide (REGLAN) injection 10 mg (10 mg Intravenous Given 04/23/21 0145)  diphenhydrAMINE (BENADRYL) injection 25 mg (25 mg Intravenous Given 04/23/21 0136)  dexamethasone (DECADRON) injection 4 mg (4 mg Intravenous Given 04/23/21 0140)    ED Course/ Medical Decision Making/ A&P                           Medical Decision Making Migraine on the left temporal area for 6 hours.    Problems Addressed: Other migraine without status migrainosus, not intractable: acute illness or injury    Details: Patient with frequent migraines  Amount and/or Complexity of Data Reviewed Labs: ordered.    Details: pregnancy test is negative  Risk Prescription drug management. Decision regarding hospitalization. Risk Details: Patient has multiple allergies that make giving migraine cocktail challenging.  I considered hospitalization but patient is well appearing without focal weakness.  No fevers, no neck pain or stiffness.  No atypical features. She is  smiling in the room with all the lights on. Disk margins are sharp.  I considered ICH but no focal deficits, no vomiting, no neck stiffness.  I considered meningitis but no fever, no neck pain, no neck stiffness.  Given normal exam and appearance I do not believe repeat imaging or hospitalization are indicated.   I do find it concerning that the patient thinks that Dilaudid is a treatment for migraines.  EDP does not use narcotics as part of migraine cocktail as the American Academy of neurology and Headache society have a position statement on [place] of these drugs and have come out against their use.  When I attempted to give a triptan to abort migraine patient then stated she was allergic to this medication as well, this was not previously on the patient's list of allergies.  Patient is resting comfortably post medication and is stable for discharge with close follow up.     There are no non-verbal cues of pain.  Patient asked if anything but narcotics helps her pain, as EDP is happy to provide any other medication for headache. She has refused other medications ordered.  She is unable to state what else will work.  This is consistent with drug seeking behavior.  PO challenged successfully, stable for discharge with close follow up.    Final Clinical Impression(s) / ED Diagnoses Final diagnoses:  Other migraine without status migrainosus, not intractable   Return for intractable cough, coughing up blood, fevers > 100.4 unrelieved by medication, shortness of breath, intractable vomiting, chest pain, shortness of breath, weakness, numbness, changes in speech, facial asymmetry, abdominal pain, passing out, Inability to tolerate liquids or food, cough, altered mental status or any concerns. No signs of systemic illness or infection. The patient is nontoxic-appearing on exam and vital signs are within normal limits.  I have reviewed the triage vital signs and the nursing notes. Pertinent labs & imaging results  that were available during my care of the patient were  reviewed by me and considered in my medical decision making (see chart for details). After history, exam, and medical workup I feel the patient has been appropriately medically screened and is safe for discharge home. Pertinent diagnoses were discussed with the patient. Patient was given return precautions.  Rx / DC Orders         Yuji Walth, MD 04/23/21 [place], Elva Breaker, MD 04/23/21 1102

## 2021-04-28 ENCOUNTER — Other Ambulatory Visit: Payer: Self-pay

## 2021-04-28 ENCOUNTER — Encounter: Payer: Self-pay | Admitting: Obstetrics & Gynecology

## 2021-04-28 ENCOUNTER — Ambulatory Visit (INDEPENDENT_AMBULATORY_CARE_PROVIDER_SITE_OTHER): Admitting: Obstetrics & Gynecology

## 2021-04-28 VITALS — BP 101/67 | HR 96 | Wt 268.0 lb

## 2021-04-28 DIAGNOSIS — N907 Vulvar cyst: Secondary | ICD-10-CM | POA: Diagnosis not present

## 2021-04-28 NOTE — Progress Notes (Signed)
History:  41 y.o. G0P0000 here today for eval of perineal cyst. Pt reports that the cyst is the same size and just as painful as she was prev.     The following portions of the patient's history were reviewed and updated as appropriate: allergies, current medications, past family history, past medical history, past social history, past surgical history and problem list.  Review of Systems:  Pertinent items are noted in HPI.    Objective:  Physical Exam Blood pressure 101/67, pulse 96, weight 268 lb (121.6 kg), last menstrual period 04/21/2021.  CONSTITUTIONAL: Well-developed, well-nourished female in no acute distress.  HENT:  Normocephalic, atraumatic EYES: Conjunctivae and EOM are normal. No scleral icterus.  NECK: Normal range of motion SKIN: Skin is warm and dry. No rash noted. Not diaphoretic.No pallor. [place]: Alert and oriented to person, place, and time. Normal coordination.  Pelvic: Normal appearing external genitalia with the exception of a Bartholin's cyst on the right side. The cyst is still not pointing. There is no erythema. Given that it is persistent and pt wants it drained. I attempted after informed consent to clean the area with alcohol and sprayed with Cetacaine. Using a 14 gauge needle, an attempt was made to aspirated the cyst. ~1 cc of dark blood was extracted from the cyst. There was no purulent drainage. This did cause the cyst to shrink in size.   Assessment & Plan:  Bartholin's cyst. Not an abscess. Explained to pt that this should resolve on its own over time.  Rec f/u in 6 weeks. If lesions changes pt is to f/u sooner prn  Tayten Bergdoll L. Harraway-Smith, M.D., Cherlynn June

## 2021-04-29 ENCOUNTER — Encounter: Payer: Self-pay | Admitting: General Practice

## 2021-04-30 ENCOUNTER — Ambulatory Visit: Admitting: Neurology

## 2021-04-30 ENCOUNTER — Ambulatory Visit: Admitting: Obstetrics and Gynecology

## 2021-05-03 ENCOUNTER — Encounter: Payer: Self-pay | Admitting: Obstetrics & Gynecology

## 2021-05-13 ENCOUNTER — Other Ambulatory Visit: Payer: Self-pay

## 2021-05-13 ENCOUNTER — Emergency Department (HOSPITAL_BASED_OUTPATIENT_CLINIC_OR_DEPARTMENT_OTHER)
Admission: EM | Admit: 2021-05-13 | Discharge: 2021-05-14 | Disposition: A | Discharge status: Home / Self Care | Attending: Emergency Medicine | Admitting: Emergency Medicine

## 2021-05-13 ENCOUNTER — Encounter (HOSPITAL_BASED_OUTPATIENT_CLINIC_OR_DEPARTMENT_OTHER): Payer: Self-pay | Admitting: Emergency Medicine

## 2021-05-13 DIAGNOSIS — R519 Headache, unspecified: Secondary | ICD-10-CM | POA: Diagnosis present

## 2021-05-13 MED ORDER — METOCLOPRAMIDE HCL 5 MG/ML IJ SOLN
10.0000 mg | Freq: Once | INTRAMUSCULAR | Status: AC
  Administered 2021-05-13: 10 mg via INTRAVENOUS
  Filled 2021-05-13: qty 2

## 2021-05-13 MED ORDER — LACTATED RINGERS IV BOLUS
1000.0000 mL | Freq: Once | INTRAVENOUS | Status: AC
  Administered 2021-05-14: 1000 mL via INTRAVENOUS

## 2021-05-13 MED ORDER — DEXAMETHASONE SODIUM PHOSPHATE 10 MG/ML IJ SOLN
10.0000 mg | Freq: Once | INTRAMUSCULAR | Status: AC
  Administered 2021-05-13: 10 mg via INTRAVENOUS
  Filled 2021-05-13: qty 1

## 2021-05-13 MED ORDER — PANTOPRAZOLE SODIUM 40 MG IV SOLR
40.0000 mg | Freq: Once | INTRAVENOUS | Status: AC
Start: 2021-05-13 — End: 2021-05-13
  Administered 2021-05-13: 40 mg via INTRAVENOUS
  Filled 2021-05-13: qty 10

## 2021-05-13 MED ORDER — DIPHENHYDRAMINE HCL 50 MG/ML IJ SOLN
25.0000 mg | Freq: Once | INTRAMUSCULAR | Status: AC
  Administered 2021-05-13: 25 mg via INTRAVENOUS
  Filled 2021-05-13: qty 1

## 2021-05-13 NOTE — ED Provider Notes (Signed)
[facility] Provider Note   CSN: [id number] Arrival date & time: 05/13/21  2256     History  Chief Complaint  Patient presents with   Headache    Amber [name] is a 41 y.o. female.   Headache Pain location:  Generalized Quality:  Dull Radiates to:  Does not radiate Severity currently:  Unable to specify Severity at highest:  Unable to specify Onset quality:  Unable to specify Timing:  Constant Progression:  Worsening Chronicity:  Recurrent Similar to prior headaches: yes   Relieved by:  None tried Worsened by:  Nothing Ineffective treatments:  None tried Associated symptoms: no abdominal pain and no fever       Home Medications Prior to Admission medications   Medication Sig Start Date End Date Taking? Authorizing Provider  acetaminophen (TYLENOL) 500 MG tablet Take 500 mg by mouth as needed.    [provider]  cabergoline (DOSTINEX) 0.5 MG tablet Take 1 tablet (0.5 mg total) by mouth as directed. 1 mg twice a week and 0.5 mg once a week 09/03/20   Shamleffer, Melanie Crazier, MD  cyclobenzaprine (FLEXERIL) 10 MG tablet Take 0.5-1 tablets (5-10 mg total) by mouth 3 (three) times daily as needed for muscle spasms. 11/10/20   Shelda Pal, DO  escitalopram (LEXAPRO) 10 MG tablet Take 1 tablet (10 mg total) by mouth daily. 09/02/20   Shelda Pal, DO  famotidine (PEPCID) 20 MG tablet Take 20 mg by mouth 2 (two) times daily.    [provider]  fenofibrate (TRICOR) 145 MG tablet Take 1 tablet (145 mg total) by mouth daily. 12/12/20   Wendling, Crosby Oyster, DO  Galcanezumab-gnlm (EMGALITY) 120 MG/ML SOAJ Inject 120 mg into the skin every 28 (twenty-eight) days. 03/24/21   Tomi Likens, Adam R, DO  hydrOXYzine (ATARAX/VISTARIL) 25 MG tablet TAKE 1-3 TABLETS (25-75 MG TOTAL) BY MOUTH 3 (THREE) TIMES DAILY AS NEEDED FOR ANXIETY. 01/22/18   Shelda Pal, DO  lidocaine (LIDODERM) 5 % Place 1 patch onto  the skin every 12 (twelve) hours. Remove & Discard patch within 12 hours or as directed by MD 05/07/20   Rosemarie Ax, MD  metFORMIN (GLUCOPHAGE) 500 MG tablet Take 1 tablet (500 mg total) by mouth 2 (two) times daily with a meal. 09/03/20   Shamleffer, Melanie Crazier, MD  pantoprazole (PROTONIX) 40 MG tablet Take 1 tablet (40 mg total) by mouth 2 (two) times daily. 01/28/21   Shelda Pal, DO  QUEtiapine (SEROQUEL) 300 MG tablet Take 1 tablet (300 mg total) by mouth at bedtime. 04/23/21   Shelda Pal, DO  Riboflavin 400 MG CAPS Take 400 mg by mouth daily.    [provider]  Rimegepant Sulfate (NURTEC) 75 MG TBDP Take 75 mg by mouth as needed (Daily as needed for a Migraine). Maximum 1 tablet in 24 hours.  Quantity 8. 04/23/21   Tomi Likens, Adam R, DO  rosuvastatin (CRESTOR) 20 MG tablet Take 1 tablet (20 mg total) by mouth daily. 10/08/20   Shelda Pal, DO  topiramate (TOPAMAX) 100 MG tablet Take 1 tablet (100 mg total) by mouth 2 (two) times daily. 01/01/21   Pieter Partridge, DO  traMADol (ULTRAM) 50 MG tablet Take 1 tablet (50 mg total) by mouth every 6 (six) hours as needed. 01/29/21   Pieter Partridge, DO      Allergies    Aspirin, Chlorhexidine, Ibuprofen, Iodine, Lunesta [eszopiclone], Nsaids, Shellfish allergy, Sulfa antibiotics, Triptans, and Tape  Review of Systems   Review of Systems  Constitutional:  Negative for fever.  Gastrointestinal:  Negative for abdominal pain.  Neurological:  Positive for headaches.   Physical Exam Updated Vital Signs BP 107/72 (BP Location: Right Arm)    Pulse (!) 111    Temp 97.8 F (36.6 C) (Oral)    Resp 18    Ht 5\' 6"  (1.676 m)    Wt 117.9 kg    LMP 04/21/2021 (Exact Date)    SpO2 100%    BMI 41.97 kg/m  Physical Exam Vitals and nursing note reviewed.  Constitutional:      Appearance: She is well-developed.  HENT:     Head: Normocephalic and atraumatic.  Eyes:     Extraocular Movements: Extraocular movements  intact.  Cardiovascular:     Rate and Rhythm: Normal rate and regular rhythm.  Pulmonary:     Effort: Pulmonary effort is normal. No respiratory distress.     Breath sounds: No stridor.  Abdominal:     General: There is no distension.     Palpations: Abdomen is soft.  Musculoskeletal:        General: Normal range of motion.     Cervical back: Normal range of motion.  Skin:    General: Skin is warm and dry.  Neurological:     Mental Status: She is alert.     GCS: GCS eye subscore is 4. GCS verbal subscore is 5. GCS motor subscore is 6.     Cranial Nerves: No cranial nerve deficit.     Motor: No weakness.    ED Results / Procedures / Treatments   Labs (all labs ordered are listed, but only abnormal results are displayed) Labs Reviewed - No data to display  EKG None  Radiology No results found.  Procedures Procedures    Medications Ordered in ED Medications - No data to display  ED Course/ Medical Decision Making/ A&P                           Medical Decision Making Risk Prescription drug management.   41 yo F here with recurrent migraine. No new features. Tried home medications without relief. No neurologic changes. Symptoms treated, improved. Neuro unchanged. Stable for discharge.   Final Clinical Impression(s) / ED Diagnoses Final diagnoses:  Nonintractable headache, unspecified chronicity pattern, unspecified headache type    Rx / DC Orders ED Discharge Orders     None         Aleana Fifita, Corene Cornea, MD 05/14/21 [phone]

## 2021-05-13 NOTE — ED Triage Notes (Signed)
Pt c/o a migraine headache onset 4 am today. Headache associated with sensitivity to light, sound, nausea and vomiting. Emesis x4-5 today.

## 2021-05-14 MED ORDER — HYDROMORPHONE HCL 1 MG/ML IJ SOLN
1.0000 mg | Freq: Once | INTRAMUSCULAR | Status: AC
  Administered 2021-05-14: 1 mg via INTRAVENOUS
  Filled 2021-05-14: qty 1

## 2021-05-14 MED ORDER — MAGNESIUM SULFATE 2 GM/50ML IV SOLN
2.0000 g | Freq: Once | INTRAVENOUS | Status: AC
  Administered 2021-05-14: 2 g via INTRAVENOUS
  Filled 2021-05-14: qty 50

## 2021-05-14 MED ORDER — DROPERIDOL 2.5 MG/ML IJ SOLN
1.2500 mg | Freq: Once | INTRAMUSCULAR | Status: AC
  Administered 2021-05-14: 1.25 mg via INTRAVENOUS
  Filled 2021-05-14: qty 2

## 2021-05-14 NOTE — ED Notes (Signed)
Patient discharged to home.  All discharge instructions reviewed.  Patient verbalized understanding via teachback method.  VS WDL.  Respirations even and unlabored.  Ambulatory out of ED.   °

## 2021-06-02 ENCOUNTER — Ambulatory Visit: Admitting: Obstetrics & Gynecology

## 2021-06-16 ENCOUNTER — Ambulatory Visit: Admitting: Obstetrics & Gynecology

## 2021-06-17 ENCOUNTER — Telehealth: Payer: Self-pay | Admitting: Neurology

## 2021-06-17 NOTE — Telephone Encounter (Signed)
1. Which medications need refilled? (List name and dosage, if known) tramadol, '50MG'$   ? ?2. Which pharmacy/location is medication to be sent to? (include street and city if local pharmacy) [place] in [place] ? ?3. Do they need a 30 day or 90 day supply? 30 ? ?

## 2021-06-18 ENCOUNTER — Other Ambulatory Visit: Payer: Self-pay | Admitting: Neurology

## 2021-06-18 MED ORDER — TRAMADOL HCL 50 MG PO TABS: 50.0000 mg | ORAL_TABLET | Freq: Four times a day (QID) | ORAL | 2 refills | Status: DC | PRN

## 2021-06-18 NOTE — Telephone Encounter (Signed)
Pt advised.

## 2021-06-28 ENCOUNTER — Telehealth: Payer: Self-pay | Admitting: Neurology

## 2021-06-28 ENCOUNTER — Other Ambulatory Visit: Payer: Self-pay | Admitting: Neurology

## 2021-06-28 MED ORDER — ONDANSETRON HCL 8 MG PO TABS: 8.0000 mg | ORAL_TABLET | ORAL | 0 refills | Status: DC | PRN

## 2021-06-28 MED ORDER — NURTEC 75 MG PO TBDP
75.0000 mg | ORAL_TABLET | ORAL | 2 refills | Status: DC | PRN
Start: 2021-06-28 — End: 2021-11-25

## 2021-06-28 MED ORDER — ONDANSETRON HCL 8 MG PO TABS: 8.0000 mg | ORAL_TABLET | ORAL | 2 refills | Status: DC | PRN

## 2021-06-28 MED ORDER — EMGALITY 120 MG/ML ~~LOC~~ SOAJ: 120.0000 mg | SUBCUTANEOUS | 2 refills | Status: DC

## 2021-06-28 MED ORDER — NURTEC 75 MG PO TBDP: 75.0000 mg | ORAL_TABLET | ORAL | 0 refills | Status: DC | PRN

## 2021-06-28 NOTE — Telephone Encounter (Signed)
Patient called and stated she needs a refill of Nurtec and Zofran.  She uses meds by mail.  She stated she needs a refill of emgality.  She doesn't have enough to last until her appt in May. ?

## 2021-07-02 ENCOUNTER — Encounter: Admitting: Obstetrics & Gynecology

## 2021-07-15 ENCOUNTER — Other Ambulatory Visit: Payer: Self-pay | Admitting: Family Medicine

## 2021-07-16 ENCOUNTER — Ambulatory Visit (INDEPENDENT_AMBULATORY_CARE_PROVIDER_SITE_OTHER): Admitting: Family Medicine

## 2021-07-16 ENCOUNTER — Encounter: Payer: Self-pay | Admitting: Family Medicine

## 2021-07-16 VITALS — BP 114/68 | HR 96 | Temp 99.1°F | Ht 66.0 in | Wt 273.2 lb

## 2021-07-16 DIAGNOSIS — M25561 Pain in right knee: Secondary | ICD-10-CM

## 2021-07-16 MED ORDER — TRIAMCINOLONE ACETONIDE 40 MG/ML IJ SUSP
40.0000 mg | Freq: Once | INTRAMUSCULAR | Status: AC
  Administered 2021-07-16: 40 mg via INTRA_ARTICULAR

## 2021-07-16 NOTE — Progress Notes (Signed)
Musculoskeletal Exam ? ?Patient: Amber [name] DOB: [birth year] ? ?DOS: 07/16/2021 ? ?SUBJECTIVE: ? ?Chief Complaint:  ? ?Chief Complaint  ?Patient presents with  ? Knee Pain  ?  Right ?  ? ? ?Amber [name] is a 41 y.o.  female for evaluation and treatment of R knee pain.  ? ?Onset:  1 month ago. Started cycling and walking more.  She had initial issues around 10 years ago for a couple years after twisting her knee while walking her dogs. ?Location: antero-medial/lateral knee; also feels like it is deep inside her knee ?Character:  aching and sharp ?Progression of issue:  has worsened ?Associated symptoms: slight swelling ?No weakness, redness, bruising ?Treatment: to date has been acetaminophen and oral steroids.  She received steroids on 2 separate occasions with significantly helped. ?Neurovascular symptoms: no ? ?Past Medical History:  ?Diagnosis Date  ? Anxiety   ? Asthma   ? Depression   ? GERD (gastroesophageal reflux disease)   ? History of chicken pox   ? History of migraine headaches   ? Pituitary adenoma ([place])   ? Seizures ([place])   ? ? ?Objective: ?VITAL SIGNS: BP 114/68   Pulse 96   Temp 99.1 ?F (37.3 ?C) (Oral)   Ht '5\' 6"'$  (1.676 m)   Wt 273 lb 4 oz (123.9 kg)   SpO2 96%   BMI 44.10 kg/m?  ?Constitutional: Well formed, well developed. No acute distress. ?Thorax & Lungs: No accessory muscle use ?Musculoskeletal: R knee.   ?Normal active range of motion: yes.   ?Normal passive range of motion: yes ?Tenderness to palpation: no ?Deformity: no ?Ecchymosis: no ?Tests positive: None ?Tests negative: Lachman's, Stines, McMurray's, varus/valgus stress, patellar apprehension/grind ?Neurologic: Normal sensory function. No focal deficits noted. DTR's equal and symmetric in LE's. No clonus. ?Psychiatric: Normal mood. Age appropriate judgment and insight. Alert & oriented x 3.   ? ?Procedure Note; Knee injection ?Verbal consent obtained. ?The area of the antero-lateral joint line was palpated and cleaned  with alcohol x1. ?A 25-gauge needle was used to enter the joint space anterolaterally with ease. ?40 mg of Kenalog with 2 mL of 1% lidocaine was injected. ?A bandaid was placed.  ?The patient tolerated the procedure well. ?There were no complications noted. ? ?Assessment: ? ?Right knee pain, unspecified chronicity - Plan: triamcinolone acetonide (KENALOG-40) injection 40 mg, PR DRAIN/INJECT LARGE JOINT/BURSA ? ?Plan: ?Stretches/exercises, heat, ice, Tylenol.  Steroid injection today given her response to oral steroids.  She cannot take anti-inflammatories due to nausea and vomiting.  If no improvement, will refer to sports medicine for possible imaging. ?F/u prn. ?The patient voiced understanding and agreement to the plan. ? ? ?[place], DO ?07/16/21  ?4:34 PM ? ?

## 2021-07-16 NOTE — Patient Instructions (Signed)
Ice/cold pack over area for 10-15 min twice daily. ? ?Heat (pad or rice pillow in microwave) over affected area, 10-15 minutes twice daily.  ? ?OK to take Tylenol 1000 mg (2 extra strength tabs) or 975 mg (3 regular strength tabs) every 6 hours as needed. ? ?Let us know if you need anything. ? ?Knee Exercises ?It is normal to feel mild stretching, pulling, tightness, or discomfort as you do these exercises, but you should stop right away if you feel sudden pain or your pain gets worse.  ?STRETCHING AND RANGE OF MOTION EXERCISES  ?These exercises warm up your muscles and joints and improve the movement and flexibility of your knee. These exercises also help to relieve pain, numbness, and tingling. ?Exercise A: Knee Extension, Prone  ?Lie on your abdomen on a bed. ?Place your left / right knee just beyond the edge of the surface so your knee is not on the bed. You can put a towel under your left / right thigh just above your knee for comfort. ?Relax your leg muscles and allow gravity to straighten your knee. You should feel a stretch behind your left / right knee. ?Hold this position for 30 seconds. ?Scoot up so your knee is supported between repetitions. ?Repeat 2 times. Complete this stretch 3 times per week. ?Exercise B: Knee Flexion, Active  ? ?  ?Lie on your back with both knees straight. If this causes back discomfort, bend your left / right knee so your foot is flat on the floor. ?Slowly slide your left / right heel back toward your buttocks until you feel a gentle stretch in the front of your knee or thigh. ?Hold this position for 30 seconds. ?Slowly slide your left / right heel back to the starting position. ?Repeat 2 times. Complete this exercise 3 times per week. ?Exercise C: Quadriceps, Prone  ? ?  ?Lie on your abdomen on a firm surface, such as a bed or padded floor. ?Bend your left / right knee and hold your ankle. If you cannot reach your ankle or pant leg, loop a belt around your foot and grab the belt  instead. ?Gently pull your heel toward your buttocks. Your knee should not slide out to the side. You should feel a stretch in the front of your thigh and knee. ?Hold this position for 30 seconds. ?Repeat 2 times. Complete this stretch 3 times per week. ?Exercise D: Hamstring, Supine  ?Lie on your back. ?Loop a belt or towel over the ball of your left / right foot. The ball of your foot is on the walking surface, right under your toes. ?Straighten your left / right knee and slowly pull on the belt to raise your leg until you feel a gentle stretch behind your knee. ?Do not let your left / right knee bend while you do this. ?Keep your other leg flat on the floor. ?Hold this position for 30 seconds. ?Repeat 2 times. Complete this stretch 3 times per week. ?STRENGTHENING EXERCISES  ?These exercises build strength and endurance in your knee. Endurance is the ability to use your muscles for a long time, even after they get tired. ?Exercise E: Quadriceps, Isometric  ? ?  ?Lie on your back with your left / right leg extended and your other knee bent. Put a rolled towel or small pillow under your knee if told by your health care provider. ?Slowly tense the muscles in the front of your left / right thigh. You should see your kneecap slide  up toward your hip or see increased dimpling just above the knee. This motion will push the back of the knee toward the floor. ?For 3 seconds, keep the muscle as tight as you can without increasing your pain. ?Relax the muscles slowly and completely. Repeat for 10 total reps ?Repeat 2 ti mes. Complete this exercise 3 times per week. ?Exercise F: Straight Leg Raises - Quadriceps  ?Lie on your back with your left / right leg extended and your other knee bent. ?Tense the muscles in the front of your left / right thigh. You should see your kneecap slide up or see increased dimpling just above the knee. Your thigh may even shake a bit. ?Keep these muscles tight as you raise your leg 4-6 inches  (10-15 cm) off the floor. Do not let your knee bend. ?Hold this position for 3 seconds. ?Keep these muscles tense as you lower your leg. ?Relax your muscles slowly and completely after each repetition. 10 total reps. ?Repeat 2 times. Complete this exercise 3 times per week. ? ?Exercise G: Hamstring Curls  ? ?  ?If told by your health care provider, do this exercise while wearing ankle weights. Begin with 5 lb weights (optional). Then increase the weight by 1 lb (0.5 kg) increments. Do not wear ankle weights that are more than 20 lbs to start with. ?Lie on your abdomen with your legs straight. ?Bend your left / right knee as far as you can without feeling pain. Keep your hips flat against the floor. ?Hold this position for 3 seconds. ?Slowly lower your leg to the starting position. Repeat for 10 reps.  ?Repeat 2 times. Complete this exercise 3 times per week. ?Exercise H: Squats (Quadriceps)  ?Stand in front of a table, with your feet and knees pointing straight ahead. You may rest your hands on the table for balance but not for support. ?Slowly bend your knees and lower your hips like you are going to sit in a chair. ?Keep your weight over your heels, not over your toes. ?Keep your lower legs upright so they are parallel with the table legs. ?Do not let your hips go lower than your knees. ?Do not bend lower than told by your health care provider. ?If your knee pain increases, do not bend as low. ?Hold the squat position for 1 second. ?Slowly push with your legs to return to standing. Do not use your hands to pull yourself to standing. ?Repeat 2 times. Complete this exercise 3 times per week. ?Exercise I: Wall Slides (Quadriceps)  ? ?  ?Lean your back against a smooth wall or door while you walk your feet out 18-24 inches (46-61 cm) from it. ?Place your feet hip-width apart. ?Slowly slide down the wall or door until your knees Repeat 2 times. Complete this exercise every other day. ?Exercise K: Straight Leg Raises -  Hip Abductors  ?Lie on your side with your left / right leg in the top position. Lie so your head, shoulder, knee, and hip line up. You may bend your bottom knee to help you keep your balance. ?Roll your hips slightly forward so your hips are stacked directly over each other and your left / right knee is facing forward. ?Leading with your heel, lift your top leg 4-6 inches (10-15 cm). You should feel the muscles in your outer hip lifting. ?Do not let your foot drift forward. ?Do not let your knee roll toward the ceiling. ?Hold this position for 3 seconds. ?Slowly return your  leg to the starting position. ?Let your muscles relax completely after each repetition. 10 total reps. ?Repeat 2 times. Complete this exercise 3 times per week. ?Exercise J: Straight Leg Raises - Hip Extensors  ?Lie on your abdomen on a firm surface. You can put a pillow under your hips if that is more comfortable. ?Tense the muscles in your buttocks and lift your left / right leg about 4-6 inches (10-15 cm). Keep your knee straight as you lift your leg. ?Hold this position for 3 seconds. ?Slowly lower your leg to the starting position. ?Let your leg relax completely after each repetition. ?Repeat 2 times. Complete this exercise 3 times per week. ?Document Released: 01/19/2005 Document Revised: 11/30/2015 Document Reviewed: 01/11/2015 ?[employer]vier Interactive Patient Education ? 2017 [employer]. ? ?

## 2021-07-19 NOTE — Progress Notes (Signed)
? ?NEUROLOGY FOLLOW UP OFFICE NOTE ? ?Amber [name] ?[id number] ? ?Assessment/Plan:  ? ?1.  Hemiplegic migraine, intractable ?2.  Cluster headache, infrequent ?2.  Prolactinoma ?  ? ?1.  Migraine prevention:  I would rather she not remain on Emgality while trying to get pregnant.  While data so far suggests it should be safe, it is quite limited for a fairly new medication.  In addition to magnesium and riboflavin, I recommended asking her fertility specialist if it would be okay to start Co Q-10 '100mg'$  three times daily ?3.  Nurtec for rescue, tramadol second line.  If she does not respond, then I will have her try sample of Reyvow '100mg'$  (advised not to drive for 8 hours after use).  Zofran for nausea. ?4.  Limit use of pain relievers to no more than 2 days out of week to prevent risk of rebound or medication-overuse headache. ?5.  Keep headache diary ?6.  Follow up in 5 months. ? ?Subjective:  ?Amber [name] is a 41 year old female with prolactinoma and anxiety with depression who follows up for migraine. ?  ?UPDATE: ?Migraines improved with 21 day dosing of Emgality. ?Intensity: moderate ?Duration:  2 hours with Nurtec ?Frequency:  6 days in April. ?However, she occasionally will have an intractable migraine that does not respond to Nurtec, requiring her to go the ED.  She went to the ED on several occasions from December to February. ?She is seeing a fertility specialist as she is going to start trying to get pregnant.  She has stopped topiramate on 4/15.  The fertility specialist said it was okay to continue Emgality.  ? ?Rescue protocol:  Nurtec, tramadol, Zofran '8mg'$  ?Frequency of abortive: 2 to 3 days a week ?Rescue protocol:  Zofran, Nurtec (tramadol second line) ?Current NSAIDS:  no ?Current analgesics:  tramadol ?Current triptans:  no ?Current ergot:  Dostinex (for hyperprolactinemia) ?Current anti-emetic:  Zofran '8mg'$  ?Current muscle relaxants:  no ?Current anti-anxiolytic:  hydroxyzine ?Current  sleep aide:  quetiapine '50mg'$  ?Current Antihypertensive medications:  no ?Current Antidepressant medications:  Lexapro '10mg'$  ?Current Anticonvulsant medications:  none ?Current CGRP inhibitor:  Emgality, Nurtec (rescue) ?Current Vitamins/Herbal/Supplements:  magnesium oxide '400mg'$ , riboflavin '400mg'$  ?Current Antihistamines/Decongestants:  Benadryl ?Other therapy:  no ?  ?Caffeine:  1 cup coffee rarely ?Alcohol:  no ?Smoker:  no ?Diet:  60 oz water daily.  Little fast food.  No soda.  Eats chicken and fish.  Not skipping meals.   ?Exercise:  no ?Depression/anxiety:  yes ?Other pain: none ?Sleep hygiene:  Improved with quetiapine ?  ?Prolactin level still elevated, however  Workup is ongoing.  She is undergoing cardiac evaluation as well. ?  ?HISTORY: ?Migraines ?Onset:  Childhood ?Location:  Left sided ?Quality:  throbbing ?Initial intensity:  severe ?Aura:  no ?Prodrome:  Feels ill for 3 days prior ?Postdrome:  "hangover" effect for 1 day after ?Associated symptoms:  Left sided numbness of face, arm and leg with slight weakness of left arm and leg.  Nausea, photophobia, and phonophobia.  She has not had any new worse headache of her life, waking up from sleep ?Initial Duration:  Several hours to several days.  Over the summer, she had status migrainosis lasting 13-14 weeks. ?Initial Frequency:  3 to 4 days a week ?Initial Frequency of abortive medication: 3 to 4 days a week ?Triggers/aggravating factors:  Sleep deprivation, shifting positions ?Relieving factors:  Laying down in dark and quiet room ?Activity:  Aggravates. ?  ?Past NSAIDS:  Ibuprofen.  Cannot  take NSAIDs due to GI bleed ?Past analgesics:  Tramadol (decreases intensity), Excedrin ?Past abortive triptans/ergot:  Sumatriptan (increased headache) ?Past muscle relaxants:  no ?Past anti-emetic:  Reglan (contraindicated with cabergoline), Zofran '4mg'$ , Promethazine ?Past antihypertensive medications:  Propranolol '80mg'$  (hypotension) ?Past antidepressant  medications:  Prozac; venlafaxine XR '150mg'$  daily ?Past anticonvulsant medications:  topiramate '100mg'$  BID (stopped as trying to get pregnant) ?Past CGRP inhibitors:  Roselyn Meier ?Past vitamins/Herbal/Supplements:  no ?Past antihistamines/decongestants:  no ?Other past therapies:  no ?Unable to afford Botox with her insurance plan. ?   ?Cluster Headache: ?Right orbital, stabbing, severe, associated with ptosis and conjunctival injection, lasting several hours and occurring 1 to 2 times a month. ?  ?Family history of headache:  mom ?  ?MRI of brain with and without contrast from 05/25/16 was personally reviewed and revealed 6 x 9 mm hypoenhancing pituitary microadenoma without compression of the optic chiasm. MRI of pituitary with and without contrast from 08/31/2019 showed regression of the microadenoma since 2018.  ? ?PAST MEDICAL HISTORY: ?Past Medical History:  ?Diagnosis Date  ? Anxiety   ? Asthma   ? Depression   ? GERD (gastroesophageal reflux disease)   ? History of chicken pox   ? History of migraine headaches   ? Pituitary adenoma ([place])   ? Seizures ([place])   ? ? ?MEDICATIONS: ?Current Outpatient Medications on File Prior to Visit  ?Medication Sig Dispense Refill  ? acetaminophen (TYLENOL) 500 MG tablet Take 500 mg by mouth as needed.    ? cabergoline (DOSTINEX) 0.5 MG tablet Take 1 tablet (0.5 mg total) by mouth as directed. 1 mg twice a week and 0.5 mg once a week 65 tablet 3  ? cyclobenzaprine (FLEXERIL) 10 MG tablet Take 0.5-1 tablets (5-10 mg total) by mouth 3 (three) times daily as needed for muscle spasms. 21 tablet 0  ? escitalopram (LEXAPRO) 10 MG tablet Take 1 tablet (10 mg total) by mouth daily. 90 tablet 2  ? famotidine (PEPCID) 20 MG tablet Take 20 mg by mouth 2 (two) times daily.    ? fenofibrate (TRICOR) 145 MG tablet Take 1 tablet (145 mg total) by mouth daily. 90 tablet 1  ? Galcanezumab-gnlm (EMGALITY) 120 MG/ML SOAJ Inject 120 mg into the skin every 28 (twenty-eight) days. 1 mL 2  ? hydrOXYzine  (ATARAX/VISTARIL) 25 MG tablet TAKE 1-3 TABLETS (25-75 MG TOTAL) BY MOUTH 3 (THREE) TIMES DAILY AS NEEDED FOR ANXIETY. 270 tablet 1  ? metFORMIN (GLUCOPHAGE) 500 MG tablet Take 1 tablet (500 mg total) by mouth 2 (two) times daily with a meal. 180 tablet 3  ? ondansetron (ZOFRAN) 8 MG tablet Take 1 tablet (8 mg total) by mouth as needed for nausea or vomiting. 20 tablet 2  ? pantoprazole (PROTONIX) 40 MG tablet Take 1 tablet (40 mg total) by mouth 2 (two) times daily. 180 tablet 1  ? QUEtiapine (SEROQUEL) 300 MG tablet Take 1 tablet (300 mg total) by mouth at bedtime. 90 tablet 3  ? Riboflavin 400 MG CAPS Take 400 mg by mouth daily.    ? Rimegepant Sulfate (NURTEC) 75 MG TBDP Take 75 mg by mouth as needed (Daily as needed for a Migraine). Maximum 1 tablet in 24 hours.  Quantity 8. 8 tablet 2  ? rosuvastatin (CRESTOR) 20 MG tablet Take 1 tablet (20 mg total) by mouth daily. 90 tablet 3  ? topiramate (TOPAMAX) 100 MG tablet Take 1 tablet (100 mg total) by mouth 2 (two) times daily. 60 tablet 3  ?  traMADol (ULTRAM) 50 MG tablet Take 1 tablet (50 mg total) by mouth every 6 (six) hours as needed. 30 tablet 2  ? ?No current facility-administered medications on file prior to visit.  ? ? ?ALLERGIES: ?Allergies  ?Allergen Reactions  ? Aspirin   ? Chlorhexidine   ? Ibuprofen   ? Iodine   ? Lunesta [Eszopiclone] Other (See Comments)  ?  GI issues  ? Nsaids Nausea And Vomiting  ? Shellfish Allergy   ?  Tingle in mouth/throat  ? Sulfa Antibiotics   ? Triptans Other (See Comments)  ?  Makes pain worse  ? Tape Rash  ?  Blisters ?  ? ? ?FAMILY HISTORY: ?Family History  ?Problem Relation Age of Onset  ? Diabetes Mother   ? Migraines Mother   ? Hyperlipidemia Mother   ? Bipolar disorder Mother   ? Irritable bowel syndrome Mother   ? Diabetes Father   ? Colon cancer Father   ? Cancer Father   ?     colon  ? Hyperlipidemia Father   ? Hypertension Father   ? Bipolar disorder Brother   ? Migraines Sister   ? Other Neg Hx   ?     pituitary  disorder  ? Esophageal cancer Neg Hx   ? ? ?  ?Objective:  ?Blood pressure 107/70, pulse 88, height '5\' 6"'$  (1.676 m), weight 275 lb 3.2 oz (124.8 kg), SpO2 92 %. ?General: No acute distress.  Patient appears well-gro

## 2021-07-20 ENCOUNTER — Encounter: Payer: Self-pay | Admitting: Neurology

## 2021-07-20 ENCOUNTER — Ambulatory Visit (INDEPENDENT_AMBULATORY_CARE_PROVIDER_SITE_OTHER): Admitting: Neurology

## 2021-07-20 VITALS — BP 107/70 | HR 88 | Ht 66.0 in | Wt 275.2 lb

## 2021-07-20 DIAGNOSIS — G44019 Episodic cluster headache, not intractable: Secondary | ICD-10-CM | POA: Diagnosis not present

## 2021-07-20 DIAGNOSIS — G43409 Hemiplegic migraine, not intractable, without status migrainosus: Secondary | ICD-10-CM

## 2021-07-20 NOTE — Patient Instructions (Signed)
Stop Emgality ?Continue magnesium oxide '400mg'$  daily and riboflavin '400mg'$  daily.  Ask your fertility specialist if you can also add Co Q-10 '100mg'$  three times daily ?Take Nurtec as needed.  If no improvement in 3-4 hours, take Reyvow '100mg'$  daily.  Let me know if it works.  Cannot drive for 8 hours after use ?Follow up 5 months. ?

## 2021-08-05 IMAGING — CT CT CERVICAL SPINE W/O CM
3 of 4 series · 13 of 33 positions shown, 16 images · non-contrast
Comparison: None.

CLINICAL DATA: Awoke with cervical neck pain.  Left shoulder pain.

Neck pain, chronic, no prior imaging
EXAM:
CT CERVICAL SPINE WITHOUT CONTRAST
TECHNIQUE: Multidetector CT imaging of the cervical spine was performed without
intravenous contrast. Multiplanar CT image reconstructions were also
generated.

[Series 4: sagittal bone · sagittal · 0.33mm/px · 5 of 57 slices shown, 6 images]
[im 19/57  bone]
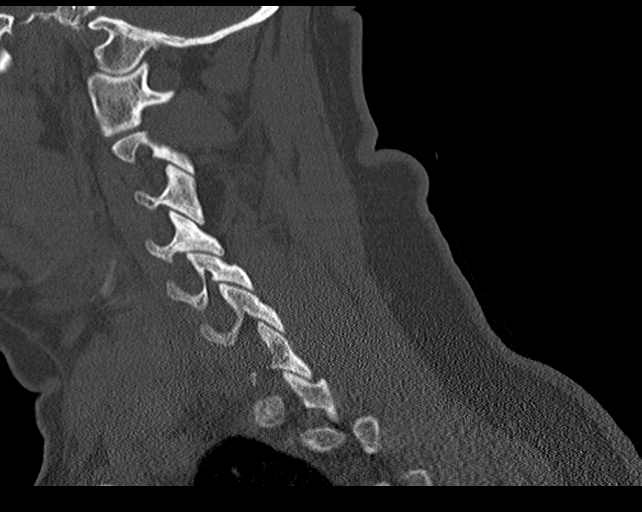
[im 24/57  bone]
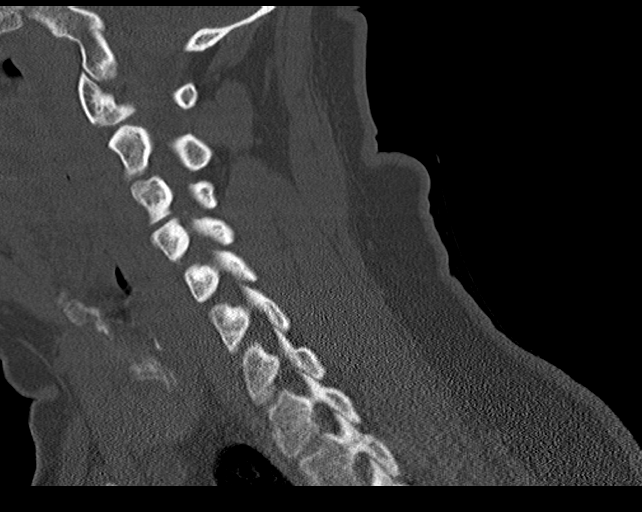
[im 29/57  soft-tissue]
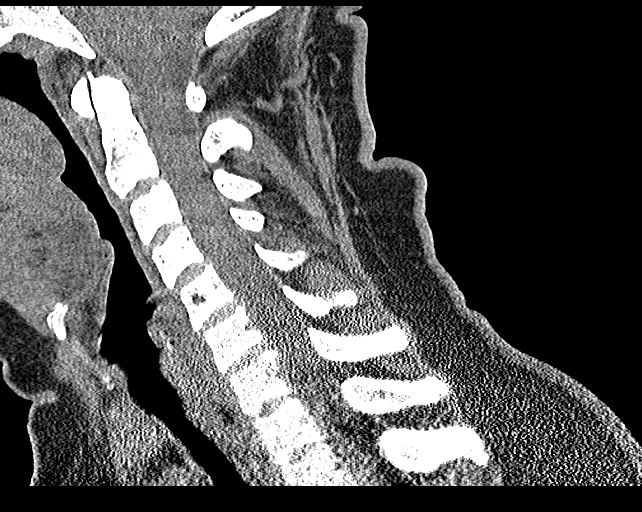
[im 29/57  bone]
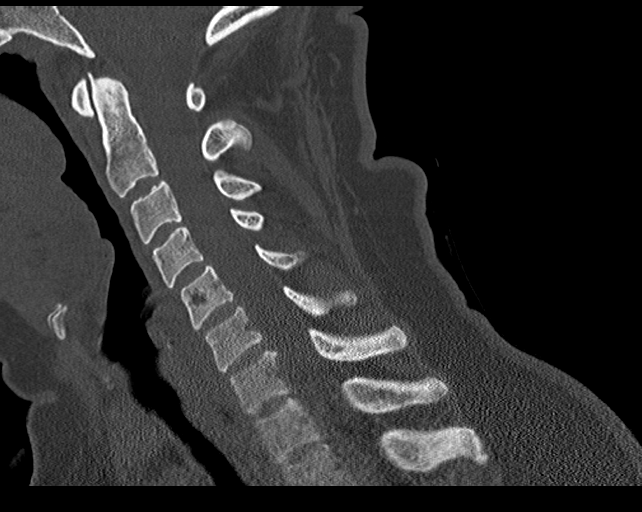
[im 33/57  bone]
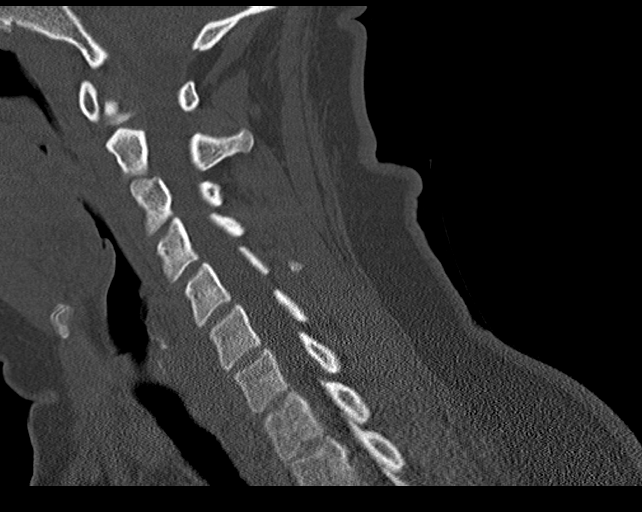
[im 38/57  bone]
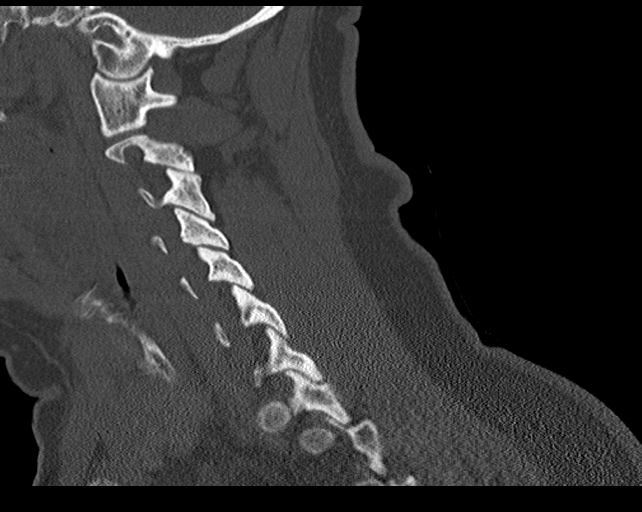

[Series 5: coronal bone · coronal · 0.23mm/px · 3 of 58 slices shown]
[im 14/58  bone]
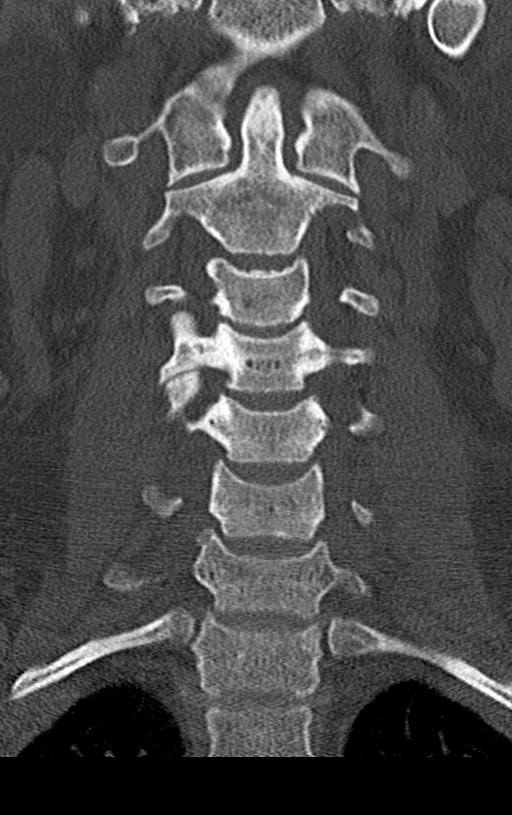
[im 24/58  bone]
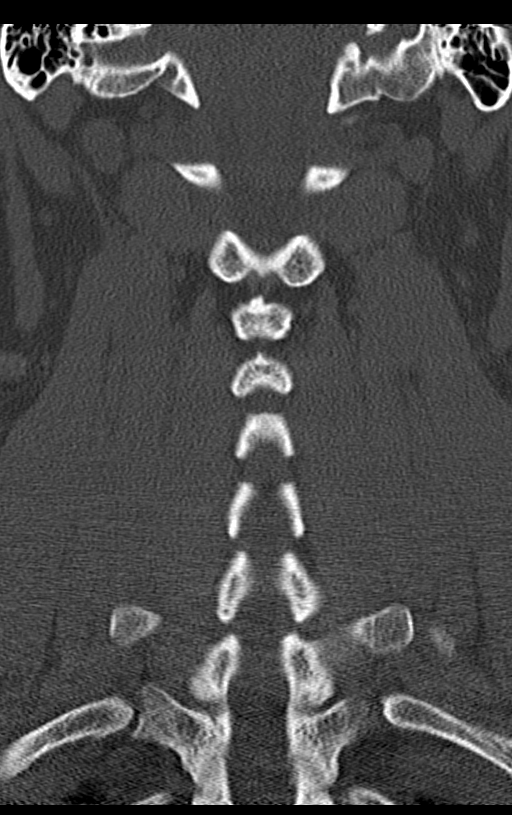
[im 34/58  bone]
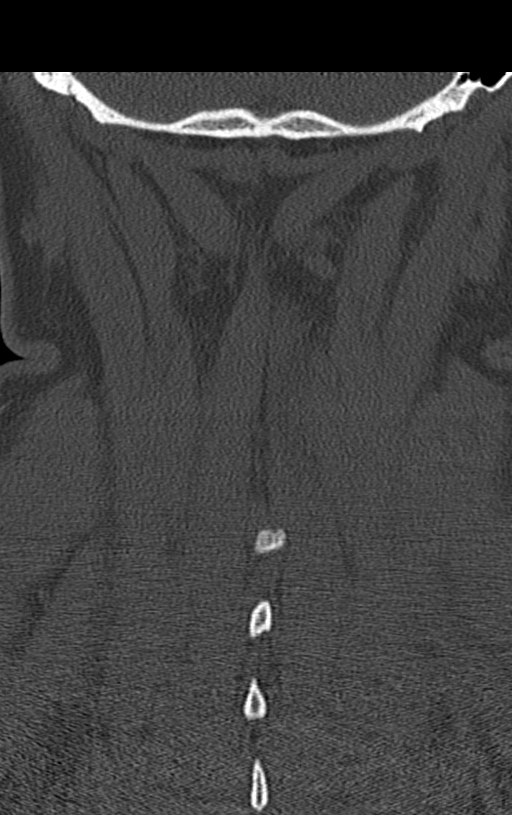

[Series 6: orthogonal bone · axial · 0.23mm/px · z∈[-151,-58]mm · 5 of 81 slices shown, 7 images]
[im 14/81  soft-tissue]
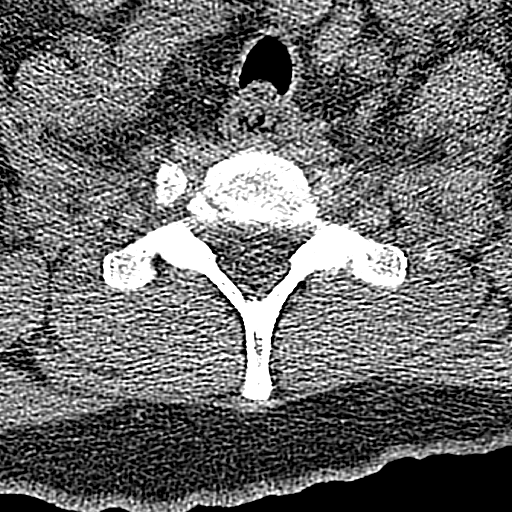
[im 14/81  bone]
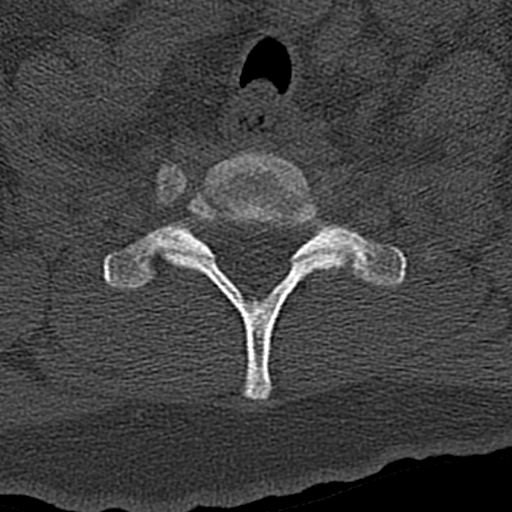
[im 27/81  bone]
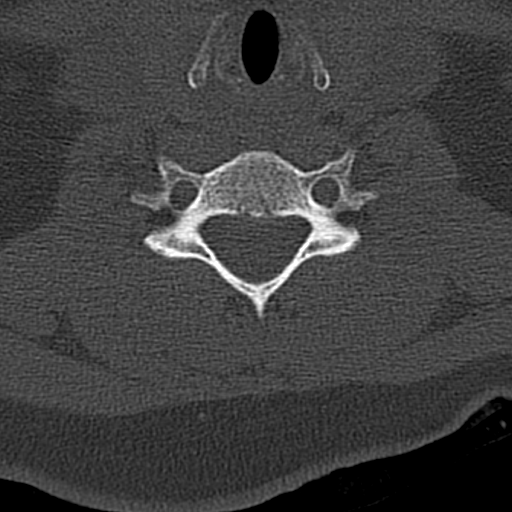
[im 41/81  bone]
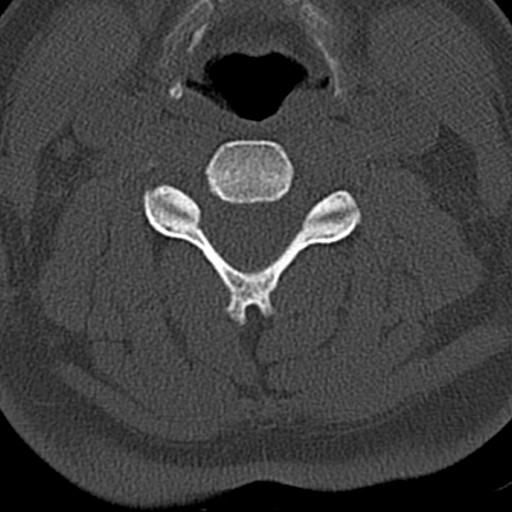
[im 54/81  bone]
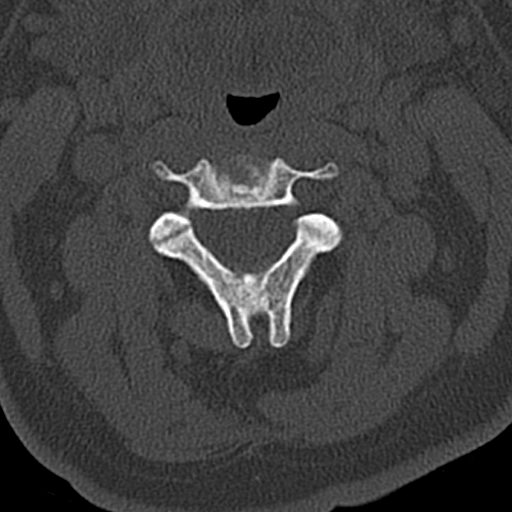
[im 67/81  soft-tissue]
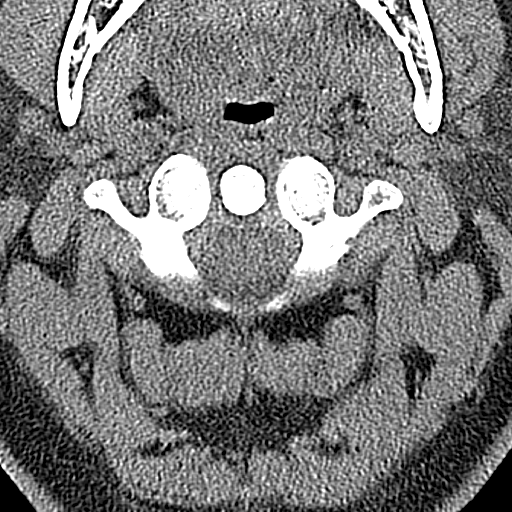
[im 67/81  bone]
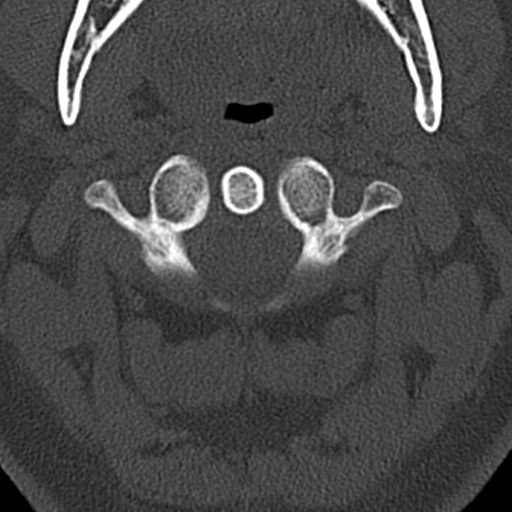

[13 of 33 positions shown; findings below may reference images not displayed]

FINDINGS: Alignment: Straightening of normal lordosis. No listhesis or
malalignment.

Skull base and vertebrae: No acute fracture. No suspicious bone
lesion or focal pathologic process. Small hemangioma within C5,
incidental.

Soft tissues and spinal canal: No prevertebral fluid or swelling. No
visible canal hematoma.

Disc levels: Minor endplate spurring and disc space narrowing at
C6-C7, no spinal canal stenosis. No significant neural foraminal
stenosis.

Upper chest: Negative.

Other: None.
IMPRESSION: 1. Straightening of normal lordosis may be due to positioning or
muscle spasm.
2. Minor degenerative disc disease at C6-C7. No spinal canal or
neural foraminal stenosis.

## 2021-08-24 ENCOUNTER — Encounter: Payer: Self-pay | Admitting: Family Medicine

## 2021-08-24 DIAGNOSIS — F418 Other specified anxiety disorders: Secondary | ICD-10-CM

## 2021-08-24 DIAGNOSIS — R739 Hyperglycemia, unspecified: Secondary | ICD-10-CM

## 2021-08-25 IMAGING — DX DG CHEST 2V
2 series · 2 of 2 positions shown · non-contrast
Comparison: Chest radiograph dated 10/01/2019.

CLINICAL DATA: 38-year-old female with chest pain.

EXAM:
CHEST - 2 VIEW

[chest pa]
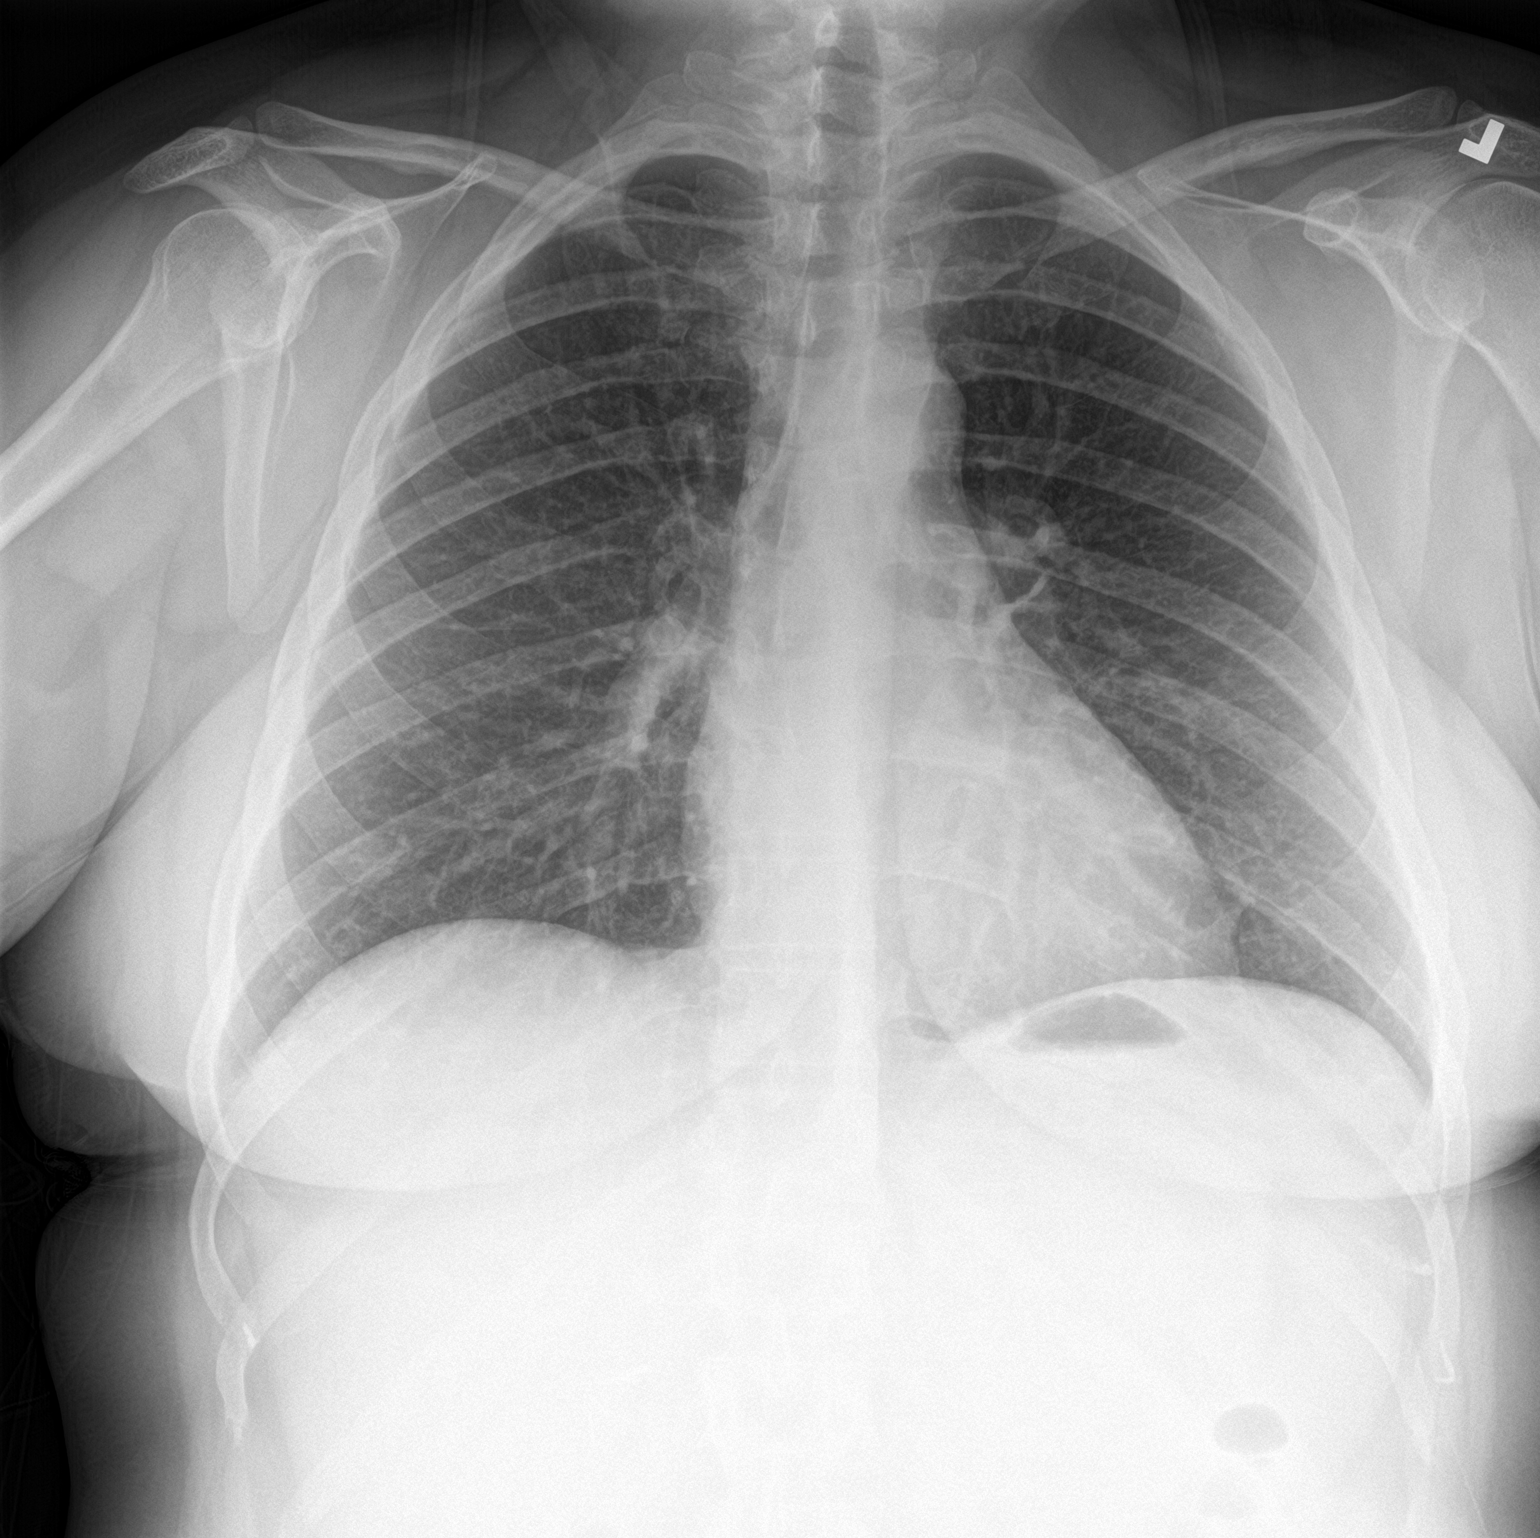

[chest lat]
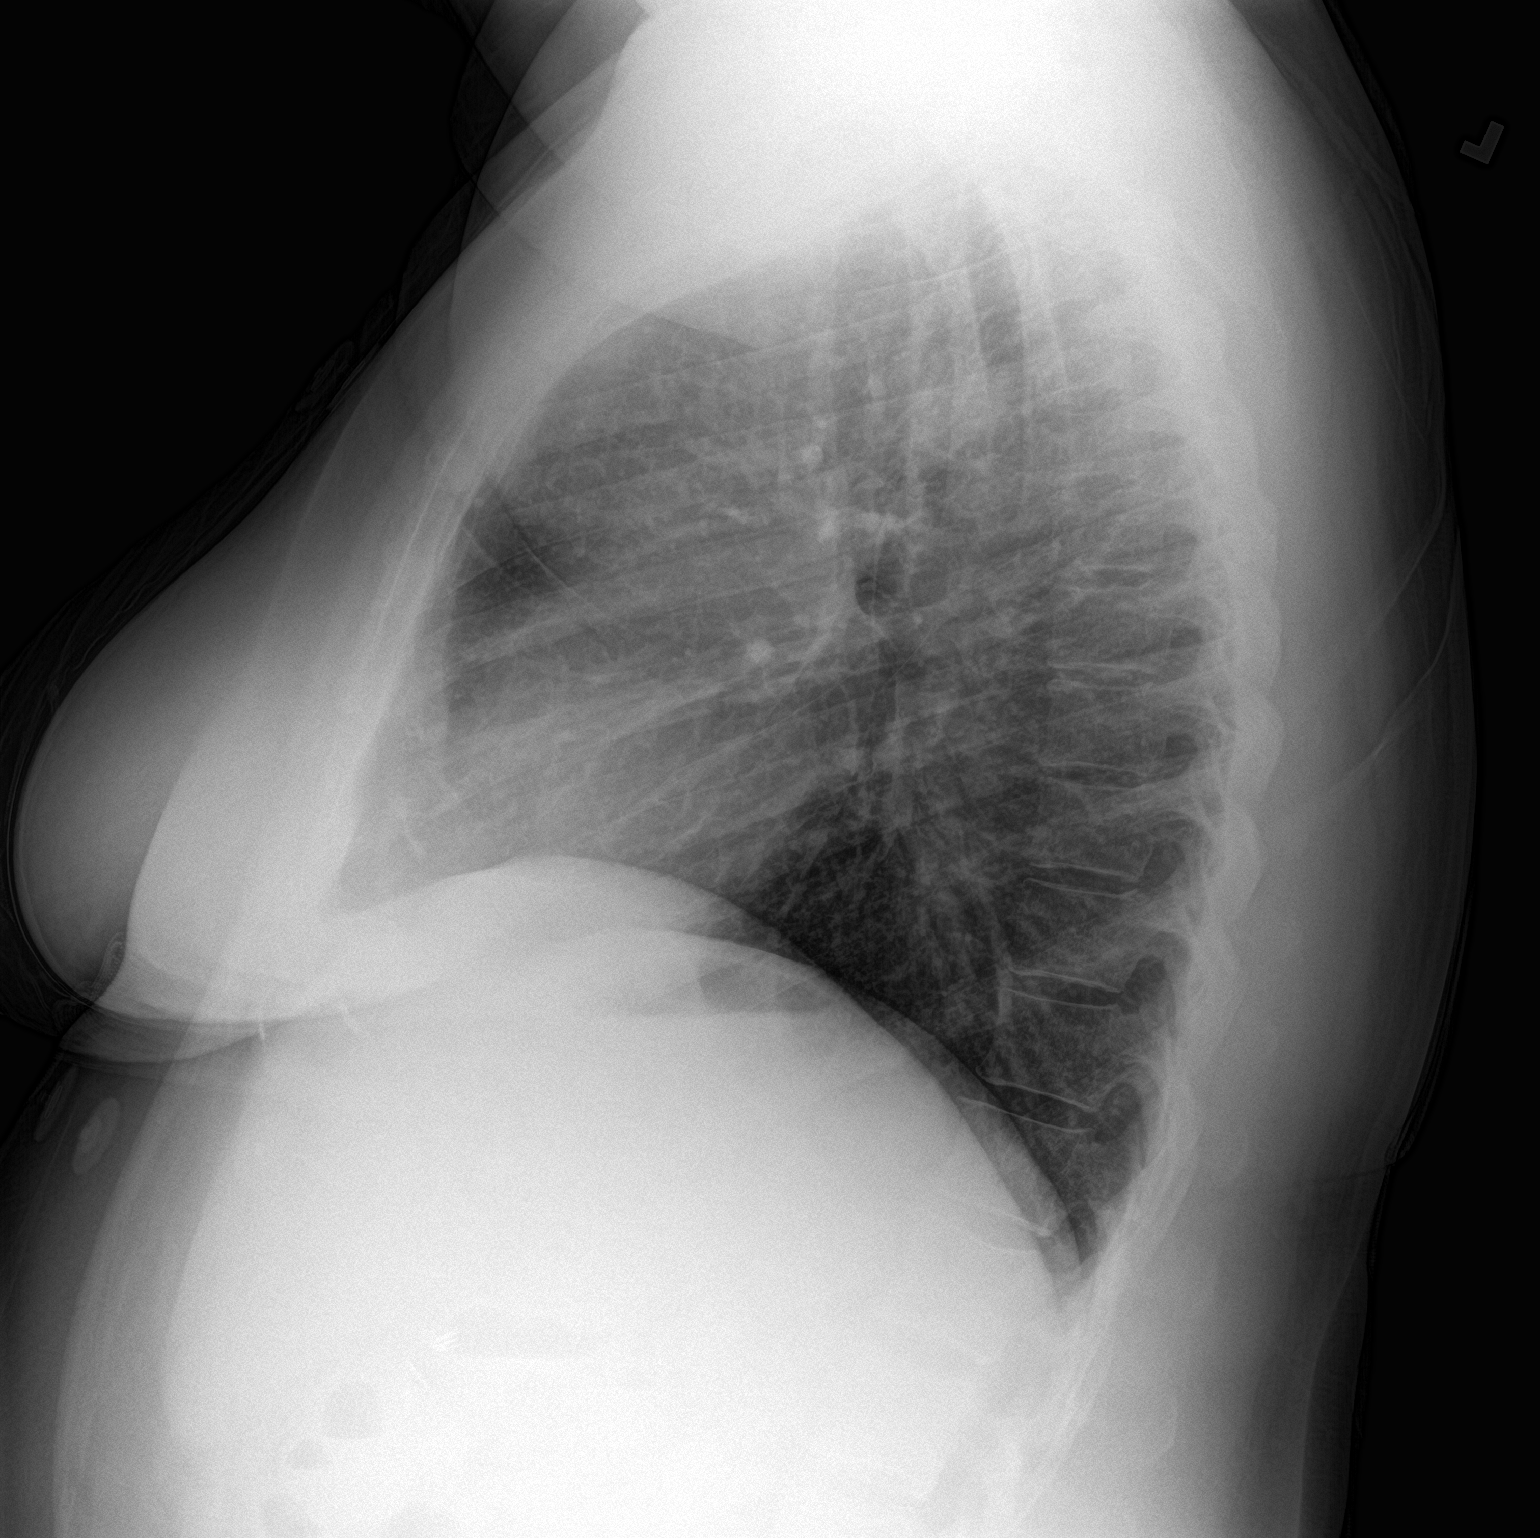

[2 of 2 positions shown; findings below may reference images not displayed]

FINDINGS: The heart size and mediastinal contours are within normal limits.
Both lungs are clear. The visualized skeletal structures are
unremarkable.
IMPRESSION: No active cardiopulmonary disease.

## 2021-08-25 MED ORDER — ESCITALOPRAM OXALATE 10 MG PO TABS: 10.0000 mg | ORAL_TABLET | Freq: Every day | ORAL | 3 refills | Status: DC

## 2021-08-25 MED ORDER — METFORMIN HCL 500 MG PO TABS: 500.0000 mg | ORAL_TABLET | Freq: Two times a day (BID) | ORAL | 3 refills | Status: DC

## 2021-08-25 MED ORDER — FENOFIBRATE 145 MG PO TABS: 145.0000 mg | ORAL_TABLET | Freq: Every day | ORAL | 3 refills | Status: DC

## 2021-08-25 MED ORDER — ROSUVASTATIN CALCIUM 20 MG PO TABS: 20.0000 mg | ORAL_TABLET | Freq: Every day | ORAL | 3 refills | Status: DC

## 2021-08-25 MED ORDER — PANTOPRAZOLE SODIUM 40 MG PO TBEC: 40.0000 mg | DELAYED_RELEASE_TABLET | Freq: Two times a day (BID) | ORAL | 3 refills | Status: DC

## 2021-08-31 ENCOUNTER — Encounter (HOSPITAL_BASED_OUTPATIENT_CLINIC_OR_DEPARTMENT_OTHER): Payer: Self-pay

## 2021-08-31 ENCOUNTER — Emergency Department (HOSPITAL_BASED_OUTPATIENT_CLINIC_OR_DEPARTMENT_OTHER)
Admission: EM | Admit: 2021-08-31 | Discharge: 2021-08-31 | Disposition: A | Discharge status: Home / Self Care | Attending: Emergency Medicine | Admitting: Emergency Medicine

## 2021-08-31 DIAGNOSIS — G43009 Migraine without aura, not intractable, without status migrainosus: Secondary | ICD-10-CM | POA: Diagnosis present

## 2021-08-31 MED ORDER — HYDROMORPHONE HCL 1 MG/ML IJ SOLN
1.0000 mg | Freq: Once | INTRAMUSCULAR | Status: AC
  Administered 2021-08-31: 1 mg via INTRAMUSCULAR
  Filled 2021-08-31: qty 1

## 2021-08-31 MED ORDER — METOCLOPRAMIDE HCL 5 MG/ML IJ SOLN
10.0000 mg | Freq: Once | INTRAMUSCULAR | Status: AC
  Administered 2021-08-31: 10 mg via INTRAMUSCULAR
  Filled 2021-08-31: qty 2

## 2021-08-31 MED ORDER — DEXAMETHASONE 4 MG PO TABS
10.0000 mg | ORAL_TABLET | Freq: Once | ORAL | Status: AC
  Administered 2021-08-31: 10 mg via ORAL
  Filled 2021-08-31: qty 3

## 2021-08-31 MED ORDER — DIPHENHYDRAMINE HCL 25 MG PO CAPS
25.0000 mg | ORAL_CAPSULE | Freq: Once | ORAL | Status: AC
  Administered 2021-08-31: 25 mg via ORAL
  Filled 2021-08-31: qty 1

## 2021-08-31 NOTE — ED Provider Notes (Signed)
[facility] EMERGENCY DEPARTMENT Provider Note   CSN: [id number] Arrival date & time: 08/31/21  1813     History  Chief Complaint  Patient presents with   Migraine    Amber [name] is a 41 y.o. female.  Patient here with migraine.  Took new migraine medication at home today that did not seem to help.  Seem to maybe make her symptoms worse.  Migraine typical of her bad migraines.  No weakness or numbness.  Denies any chest pain, abdominal pain, altered mental status, fever, chills.  History of pituitary adenoma but under control.  Had this removed in the past but now is back but her prolactin levels have been normal.  She denies any vision changes, weakness, other illnesses.  The history is provided by the patient.       Home Medications Prior to Admission medications   Medication Sig Start Date End Date Taking? Authorizing Provider  acetaminophen (TYLENOL) 500 MG tablet Take 500 mg by mouth as needed.    [provider]  cabergoline (DOSTINEX) 0.5 MG tablet Take 1 tablet (0.5 mg total) by mouth as directed. 1 mg twice a week and 0.5 mg once a week 09/03/20   Shamleffer, Melanie Crazier, MD  escitalopram (LEXAPRO) 10 MG tablet Take 1 tablet (10 mg total) by mouth daily. 08/25/21   Shelda Pal, DO  famotidine (PEPCID) 20 MG tablet Take 20 mg by mouth 2 (two) times daily.    [provider]  fenofibrate (TRICOR) 145 MG tablet Take 1 tablet (145 mg total) by mouth daily. 08/25/21   Shelda Pal, DO  hydrOXYzine (ATARAX/VISTARIL) 25 MG tablet TAKE 1-3 TABLETS (25-75 MG TOTAL) BY MOUTH 3 (THREE) TIMES DAILY AS NEEDED FOR ANXIETY. 01/22/18   Shelda Pal, DO  metFORMIN (GLUCOPHAGE) 500 MG tablet Take 1 tablet (500 mg total) by mouth 2 (two) times daily with a meal. 08/25/21   Wendling, Crosby Oyster, DO  ondansetron (ZOFRAN) 8 MG tablet Take 1 tablet (8 mg total) by mouth as needed for nausea or vomiting. 06/28/21   Tomi Likens, Tashyra Adduci R,  DO  pantoprazole (PROTONIX) 40 MG tablet Take 1 tablet (40 mg total) by mouth 2 (two) times daily. 08/25/21   Shelda Pal, DO  QUEtiapine (SEROQUEL) 300 MG tablet Take 1 tablet (300 mg total) by mouth at bedtime. 04/23/21   Shelda Pal, DO  Riboflavin 400 MG CAPS Take 400 mg by mouth daily.    [provider]  Rimegepant Sulfate (NURTEC) 75 MG TBDP Take 75 mg by mouth as needed (Daily as needed for a Migraine). Maximum 1 tablet in 24 hours.  Quantity 8. 06/28/21   Tomi Likens, Bentley Fissel R, DO  rosuvastatin (CRESTOR) 20 MG tablet Take 1 tablet (20 mg total) by mouth daily. 08/25/21   Wendling, Crosby Oyster, DO  traMADol (ULTRAM) 50 MG tablet Take 1 tablet (50 mg total) by mouth every 6 (six) hours as needed. 06/18/21   Pieter Partridge, DO      Allergies    Aspirin, Chlorhexidine, Ibuprofen, Iodine, Lunesta [eszopiclone], Nsaids, Shellfish allergy, Sulfa antibiotics, Triptans, and Tape    Review of Systems   Review of Systems  Physical Exam Updated Vital Signs BP 113/76 (BP Location: Right Arm)   Pulse 86   Temp 98.6 F (37 C) (Oral)   Resp 18   Ht '5\' 6"'$  (1.676 m)   Wt 122.5 kg   SpO2 97%   BMI 43.58 kg/m  Physical Exam Vitals  and nursing note reviewed.  Constitutional:      General: She is not in acute distress.    Appearance: She is well-developed. She is not ill-appearing.  HENT:     Head: Normocephalic and atraumatic.     Nose: Nose normal.     Mouth/Throat:     Mouth: Mucous membranes are moist.  Eyes:     Extraocular Movements: Extraocular movements intact.     Conjunctiva/sclera: Conjunctivae normal.     Pupils: Pupils are equal, round, and reactive to light.  Cardiovascular:     Rate and Rhythm: Normal rate and regular rhythm.     Heart sounds: No murmur heard. Pulmonary:     Effort: Pulmonary effort is normal. No respiratory distress.     Breath sounds: Normal breath sounds.  Abdominal:     Palpations: Abdomen is soft.     Tenderness: There is no  abdominal tenderness.  Musculoskeletal:        General: No swelling.     Cervical back: Normal range of motion and neck supple.  Skin:    General: Skin is warm and dry.     Capillary Refill: Capillary refill takes less than 2 seconds.  Neurological:     General: No focal deficit present.     Mental Status: She is alert and oriented to person, place, and time.     Cranial Nerves: No cranial nerve deficit.     Sensory: No sensory deficit.     Motor: No weakness.     Coordination: Coordination normal.     Comments: 5+ out of 5 strength throughout, normal sensation, normal finger-nose-finger, normal speech  Psychiatric:        Mood and Affect: Mood normal.     ED Results / Procedures / Treatments   Labs (all labs ordered are listed, but only abnormal results are displayed) Labs Reviewed - No data to display  EKG None  Radiology No results found.  Procedures Procedures    Medications Ordered in ED Medications  HYDROmorphone (DILAUDID) injection 1 mg (1 mg Intramuscular Given 08/31/21 2015)  metoCLOPramide (REGLAN) injection 10 mg (10 mg Intramuscular Given 08/31/21 2015)  diphenhydrAMINE (BENADRYL) capsule 25 mg (25 mg Oral Given 08/31/21 2015)  dexamethasone (DECADRON) tablet 10 mg (10 mg Oral Given 08/31/21 2015)    ED Course/ Medical Decision Making/ A&P                           Medical Decision Making Risk Prescription drug management.   [name] is here with migraine.  Overall normal vitals.  Fairly well-appearing.  Appears comfortable in the room.  Took a headache medication today that made her symptoms worse.  Overall she has a history of reflux, migraines, pituitary adenoma.  I am not concerned about stroke or other acute neurologic process at this time.  She is neurologically intact.  She has no visual problems.  Headache cocktail was given with improvement.  Recommend follow-up with a neurologist.  Discharged in good condition.  No concern for infectious  process or stroke process and overall suspect acute on chronic migraine.  This chart was dictated using voice recognition software.  Despite best efforts to proofread,  errors can occur which can change the documentation meaning.         Final Clinical Impression(s) / ED Diagnoses Final diagnoses:  Migraine without aura and without status migrainosus, not intractable    Rx / DC Orders ED Discharge  Orders     None         Lennice Sites, DO 08/31/21 2028

## 2021-08-31 NOTE — ED Triage Notes (Signed)
C/o migraine since 0800 with N/V, light sensitivity, hx of same.  Took new med (Reyvow) for her migraine that made her more nauseous, dizzy, & hot.

## 2021-09-07 ENCOUNTER — Ambulatory Visit: Admitting: Internal Medicine

## 2021-09-07 NOTE — Progress Notes (Deleted)
Name: Amber [name]  MRN/ DOB: [id number], 06-30-1980    Age/ Sex: 41 y.o., female     PCP: Shelda Pal, DO   Reason for Endocrinology Evaluation: Hyperprolactinemia      Initial Endocrinology Clinic Visit: 03/02/2016    PATIENT IDENTIFIER: Amber [name] is a 41 y.o., female with a past medical history of Prolactinoma, GERD, Asthma and migraine headaches . She has followed with [facility] since 03/02/2016 for consultative assistance with management of her hyperprolactinemia .   HISTORICAL SUMMARY: The patient was first diagnosed with hyperprolactinemia in 1996 during evaluation for primary amenorrhea ( highest reading 198 ). She is S/P resection of prolactinoma in 2009 [place], [place] due to size 3 cm, per pt she had a sphenoidal sinus penetration. Per pt prolactin levels normalized and menses resumed.  Pt was noted with elevated prolactin levels again 2015 and was started on Cabergoline, imaging have been reported at " no tumor"     Cabergoline was started 2019 Bromocriptine was added to cabergoline 05/2019 by another endocrinologist  On her initial visit with me in 08/2019, we stopped the Bromocriptine and increased cabergoline. Since the dose exceeded 2 mg/ week , we proceed with an echocardiogram , which showed normal valve structure but there was a question about a retro aortic coronary artery. CT angio was normal.     Repeat pituitary MRI in 08/2019 showed regression of pituitary gland     NO FH of pituitary disease  SUBJECTIVE:    Today (09/07/2021):  [name] is here for a follow up on hyperprolactinemia , Prediabetes and pituitary adenoma.   Has regular menstruation , LMP 12/4th, 2022- regular  Denies nipple discharge  No vision changes   She continues with migraine headaches , follows with Dr. Loretta Plume , uses [facility]  Had an ED visit 03/06/2021 for migraine headaches   Denies nausea, vomiting or diarrhea   She is on  Cabergoline 0.5 mg:  Two tabs on Mondays, 1 tablet on wednesday and 2 tabs on Friday  Metformin 500 mg Twice daily    HISTORY:  Past Medical History:  Past Medical History:  Diagnosis Date   Anxiety    Asthma    Depression    GERD (gastroesophageal reflux disease)    History of chicken pox    History of migraine headaches    Pituitary adenoma ([place])    Seizures ([place])    Past Surgical History:  Past Surgical History:  Procedure Laterality Date   APPENDECTOMY     CHOLECYSTECTOMY     Pituiary Tumor     Tumor Removal Pituitary   Social History:  reports that she quit smoking about 5 years ago. Her smoking use included cigarettes. She has a 15.00 pack-year smoking history. She has never used smokeless tobacco. She reports that she does not drink alcohol and does not use drugs. Family History:  Family History  Problem Relation Age of Onset   Diabetes Mother    Migraines Mother    Hyperlipidemia Mother    Bipolar disorder Mother    Irritable bowel syndrome Mother    Diabetes Father    Colon cancer Father    Cancer Father        colon   Hyperlipidemia Father    Hypertension Father    Bipolar disorder Brother    Migraines Sister    Other Neg Hx        pituitary disorder   Esophageal cancer Neg Hx  HOME MEDICATIONS: Allergies as of 09/07/2021       Reactions   Aspirin    Chlorhexidine    Ibuprofen    Iodine    Lunesta [eszopiclone] Other (See Comments)   GI issues   Nsaids Nausea And Vomiting   Shellfish Allergy    Tingle in mouth/throat   Sulfa Antibiotics    Triptans Other (See Comments)   Makes pain worse   Tape Rash   Blisters        Medication List        Accurate as of September 07, 2021  7:34 AM. If you have any questions, ask your nurse or doctor.          acetaminophen 500 MG tablet Commonly known as: TYLENOL Take 500 mg by mouth as needed.   cabergoline 0.5 MG tablet Commonly known as: DOSTINEX Take 1 tablet (0.5 mg total) by mouth  as directed. 1 mg twice a week and 0.5 mg once a week   escitalopram 10 MG tablet Commonly known as: LEXAPRO Take 1 tablet (10 mg total) by mouth daily.   famotidine 20 MG tablet Commonly known as: PEPCID Take 20 mg by mouth 2 (two) times daily.   fenofibrate 145 MG tablet Commonly known as: TRICOR Take 1 tablet (145 mg total) by mouth daily.   hydrOXYzine 25 MG tablet Commonly known as: ATARAX TAKE 1-3 TABLETS (25-75 MG TOTAL) BY MOUTH 3 (THREE) TIMES DAILY AS NEEDED FOR ANXIETY.   metFORMIN 500 MG tablet Commonly known as: GLUCOPHAGE Take 1 tablet (500 mg total) by mouth 2 (two) times daily with a meal.   Nurtec 75 MG Tbdp Generic drug: Rimegepant Sulfate Take 75 mg by mouth as needed (Daily as needed for a Migraine). Maximum 1 tablet in 24 hours.  Quantity 8.   ondansetron 8 MG tablet Commonly known as: Zofran Take 1 tablet (8 mg total) by mouth as needed for nausea or vomiting.   pantoprazole 40 MG tablet Commonly known as: PROTONIX Take 1 tablet (40 mg total) by mouth 2 (two) times daily.   QUEtiapine 300 MG tablet Commonly known as: SEROQUEL Take 1 tablet (300 mg total) by mouth at bedtime.   Riboflavin 400 MG Caps Take 400 mg by mouth daily.   rosuvastatin 20 MG tablet Commonly known as: Crestor Take 1 tablet (20 mg total) by mouth daily.   traMADol 50 MG tablet Commonly known as: ULTRAM Take 1 tablet (50 mg total) by mouth every 6 (six) hours as needed.          OBJECTIVE:   PHYSICAL EXAM: VS: There were no vitals taken for this visit.   EXAM: General: Pt appears well and is in NAD  Neck: General: Supple without adenopathy. Thyroid: Thyroid size normal.  No goiter or nodules appreciated.  Lungs: Clear with good BS bilat with no rales, rhonchi, or wheezes  Heart: Auscultation: RRR.  Abdomen: Normoactive bowel sounds, soft, nontender, without masses or organomegaly palpable  Extremities:  BL LE: No pretibial edema normal ROM and strength.   Mental Status: Judgment, insight: Intact Orientation: Oriented to time, place, and person Mood and affect: No depression, anxiety, or agitation      DATA REVIEWED:      Latest Reference Range & Units 03/09/21 13:06  Sodium 135 - 145 mEq/L 140  Potassium 3.5 - 5.1 mEq/L 3.9  Chloride 96 - 112 mEq/L 109  CO2 19 - 32 mEq/L 25  Glucose 70 - 99 mg/dL 82  BUN  6 - 23 mg/dL 12  Creatinine 0.40 - 1.20 mg/dL 1.23 (H)  Calcium 8.4 - 10.5 mg/dL 9.6  Alkaline Phosphatase 39 - 117 U/L 39 - 117 U/L 29 (L) 29 (L)  Albumin 3.5 - 5.2 g/dL 3.5 - 5.2 g/dL 4.2 4.2  AST 0 - 37 U/L 0 - 37 U/L 19 19  ALT 0 - 35 U/L 0 - 35 U/L 22 22  Total Protein 6.0 - 8.3 g/dL 6.0 - 8.3 g/dL 6.8 6.8  Bilirubin, Direct 0.0 - 0.3 mg/dL 0.1  Total Bilirubin 0.2 - 1.2 mg/dL 0.2 - 1.2 mg/dL 0.5 0.5  GFR >60.00 mL/min 54.72 (L)  Total CHOL/HDL Ratio  3  Cholesterol 0 - 200 mg/dL 102  HDL Cholesterol >39.00 mg/dL 31.60 (L)  LDL (calc) 0 - 99 mg/dL 42  NonHDL  70.35  Triglycerides 0.0 - 149.0 mg/dL 142.0  VLDL 0.0 - 40.0 mg/dL 28.4     Latest Reference Range & Units 03/09/21 13:06  Prolactin ng/mL 34.6 (H)  Glucose 70 - 99 mg/dL 82  Hemoglobin A1C 4.6 - 6.5 % 5.8  TSH 0.35 - 5.50 uIU/mL 0.91  T4,Free(Direct) 0.60 - 1.60 ng/dL 0.78     MRI 08/31/2019  Dedicated pituitary imaging. Gland size appears slightly smaller now compared to 2018 (series 16, image 9 today versus series 17, image 6 previously). No suprasellar mass or mass effect. Infundibulum and hypothalamus appear normal.   Regressed hypoenhancing area in the left inferior and lateral aspect of the gland. Today there is only subtle heterogeneous enhancement on that side (series 7, image 10) encompassing 3-4 mm. The cavernous sinuses remain normal. Normal clivus. No other abnormal enhancement.   IMPRESSION: 1. Regression of pituitary microadenoma since 2018, subtle residual heterogeneity in the left aspect of the gland encompassing 3-4  mm.   2. No new intracranial abnormality. Stable mild nonspecific cerebral white matter signal changes. ASSESSMENT / PLAN / RECOMMENDATIONS:   Hx of Pituitary Macroadenoma , S/P Transphenoidal surgery in 2009:  - Pr pt she had a transphenoidal surgery for a 3 cm pituitary adenoma in 2009 - Her last MRI was in 08/2019 indicating regression  - Labs have been normal to include TSH, ACTH , cortisol and IGF-1 (08/2019)    Hyperprolactinemia :  - Diagnosed in 1996 - Normalization of levels following transphenoidal pituitary resection in 2009 , restarted on cabergoline in 2015 and again in 2019.  - In March 2021, bromocriptine was added to her regimen by previouse endocrinologist, this was discontinued in 08/2019 and increased cabergoline.  - Pt will need echocardiogram in 09/2021  - Pt understands to stop Cabergoline with positive pregnancy test and to notify our office  -Prolactin is slightly elevated but she just had a recent ED visit for migraine headaches and she was given a cocktail of medication that included Dilaudid as well as taking tramadol, I am not going to make any changes to her cabergoline dose as narcotics can cause short-term elevation of prolactin, we will continue to monitor    Medications   Continue cabergoline 0.5 mg ,a total of  2.5 mg weekly (Two tabs on Mondays, 1 tab on wednesdays and 2 tabs on Fridays)   2. Pre-Diabetes :   -A1c stable at 5.8% -No changes  Continue Metformin 500 mg Twice daily     F/U in 6 months      Signed electronically by: Mack Guise, MD  [facility] Endocrinology  [facility] [address]  [place], [place] [address] Phone: [phone] FAX: [phone]      CC: Shelda Pal, [address] [place] [address] Phone: [phone]  Fax: [phone]   Return to Endocrinology clinic as below: Future Appointments  Date Time Provider [place]   09/07/2021  1:00 PM Marjorie Deprey, Melanie Crazier, MD [id number] None  12/20/2021  1:50 PM Pieter Partridge, DO [id number] None

## 2021-09-23 ENCOUNTER — Encounter: Payer: Self-pay | Admitting: Neurology

## 2021-09-24 ENCOUNTER — Other Ambulatory Visit: Payer: Self-pay | Admitting: Neurology

## 2021-09-24 MED ORDER — TRAMADOL HCL 50 MG PO TABS: 50.0000 mg | ORAL_TABLET | Freq: Four times a day (QID) | ORAL | 2 refills | Status: DC | PRN

## 2021-10-19 ENCOUNTER — Encounter (HOSPITAL_BASED_OUTPATIENT_CLINIC_OR_DEPARTMENT_OTHER): Payer: Self-pay | Admitting: Emergency Medicine

## 2021-10-19 ENCOUNTER — Other Ambulatory Visit: Payer: Self-pay

## 2021-10-19 ENCOUNTER — Emergency Department (HOSPITAL_BASED_OUTPATIENT_CLINIC_OR_DEPARTMENT_OTHER)
Admission: EM | Admit: 2021-10-19 | Discharge: 2021-10-19 | Disposition: A | Discharge status: Home / Self Care | Attending: Emergency Medicine | Admitting: Emergency Medicine

## 2021-10-19 DIAGNOSIS — G43009 Migraine without aura, not intractable, without status migrainosus: Secondary | ICD-10-CM

## 2021-10-19 DIAGNOSIS — G43909 Migraine, unspecified, not intractable, without status migrainosus: Secondary | ICD-10-CM | POA: Insufficient documentation

## 2021-10-19 LAB — CBC
HCT: 35.3 % — ABNORMAL LOW (ref 36.0–46.0)
Hemoglobin: 11.6 g/dL — ABNORMAL LOW (ref 12.0–15.0)
MCH: 27.8 pg (ref 26.0–34.0)
MCHC: 32.9 g/dL (ref 30.0–36.0)
MCV: 84.7 fL (ref 80.0–100.0)
Platelets: 369 10*3/uL (ref 150–400)
RBC: 4.17 MIL/uL (ref 3.87–5.11)
RDW: 13.5 % (ref 11.5–15.5)
WBC: 9 10*3/uL (ref 4.0–10.5)
nRBC: 0 % (ref 0.0–0.2)

## 2021-10-19 LAB — HCG, QUANTITATIVE, PREGNANCY: hCG, Beta Chain, Quant, S: <1 m[IU]/mL (ref <5)

## 2021-10-19 LAB — BASIC METABOLIC PANEL
Anion gap: 5 (ref 5–15)
BUN: 15 mg/dL (ref 6–20)
CO2: 27 mmol/L (ref 22–32)
Calcium: 8.9 mg/dL (ref 8.9–10.3)
Chloride: 104 mmol/L (ref 98–111)
Creatinine, Ser: 1.13 mg/dL — ABNORMAL HIGH (ref 0.44–1.00)
GFR, Estimated: >60 mL/min (ref >60)
Glucose, Bld: 134 mg/dL — ABNORMAL HIGH (ref 70–99)
Potassium: 3.7 mmol/L (ref 3.5–5.1)
Sodium: 136 mmol/L (ref 135–145)

## 2021-10-19 MED ORDER — METOCLOPRAMIDE HCL 5 MG/ML IJ SOLN
10.0000 mg | Freq: Once | INTRAMUSCULAR | Status: AC
  Administered 2021-10-19: 10 mg via INTRAVENOUS
  Filled 2021-10-19: qty 2

## 2021-10-19 MED ORDER — DEXAMETHASONE SODIUM PHOSPHATE 4 MG/ML IJ SOLN
4.0000 mg | Freq: Once | INTRAMUSCULAR | Status: AC
  Administered 2021-10-19: 4 mg via INTRAVENOUS
  Filled 2021-10-19: qty 1

## 2021-10-19 MED ORDER — DIPHENHYDRAMINE HCL 50 MG/ML IJ SOLN
25.0000 mg | Freq: Once | INTRAMUSCULAR | Status: AC
  Administered 2021-10-19: 25 mg via INTRAVENOUS
  Filled 2021-10-19: qty 1

## 2021-10-19 MED ORDER — HYDROMORPHONE HCL 1 MG/ML IJ SOLN
1.0000 mg | Freq: Once | INTRAMUSCULAR | Status: AC
  Administered 2021-10-19: 1 mg via INTRAVENOUS
  Filled 2021-10-19: qty 1

## 2021-10-19 MED ORDER — SODIUM CHLORIDE 0.9 % IV BOLUS
1000.0000 mL | Freq: Once | INTRAVENOUS | Status: AC
  Administered 2021-10-19: 1000 mL via INTRAVENOUS

## 2021-10-19 NOTE — Discharge Instructions (Addendum)
Follow-up with your neurologist/PCP within the next few days for reevaluation continue medical management.  Continue to avoid triggers, stay hydrated, focus on keeping up with nutritional intake.  Limit caffeine intake.  Prioritize consistent sleep schedule.  In addition, further migraine information is been provided for you.  Return to the ED for new or worsening symptoms as discussed.

## 2021-10-19 NOTE — ED Triage Notes (Signed)
Patient c/o migraine since 8 am this morning with n/v. Patient has taken her prescribed medication without relief.

## 2021-10-19 NOTE — ED Provider Notes (Incomplete)
[facility] Provider Note   CSN: [id number] Arrival date & time: 10/19/21  1853     History {Add pertinent medical, surgical, social history, OB history to HPI:1} Chief Complaint  Patient presents with   Migraine    Amber [name] is a 41 y.o. female with a history of chronic migraines presenting today with an acute migraine.  Started around 8 AM this morning.  Tried abortive therapy such as Nurtec with no relief.  Usually comes to the ED for treatment when her normal methods have failed.  States "migraines on the left side of my head and cluster headaches on the right side of my head-this is definitely a migraine".  States the migraine has no new features and feels identical to previous migraines.  Denies vision changes, dizziness, vomiting, neck stiffness, recent upper respiratory infection, or other neurological deficits.  Has failed multiple forms of abortive/emergent migraine therapy in the past including Ubrelvy.  Recently stopped taking the Topamax due to attempts at fertility.  Tried Zofran for nausea, but states this does not help.  Patient states she is unable to take NSAIDs due to allergic reactions.  Was here few months ago and felt relief with a migraine cocktail, requests the same migraine cocktail.  History of pituitary adenoma, but had this removed in the past, however it is back and monitored closely and under control.  The history is provided by the patient and medical records.  Migraine Associated symptoms include headaches.       Home Medications Prior to Admission medications   Medication Sig Start Date End Date Taking? Authorizing Provider  acetaminophen (TYLENOL) 500 MG tablet Take 500 mg by mouth as needed.    [provider]  cabergoline (DOSTINEX) 0.5 MG tablet Take 1 tablet (0.5 mg total) by mouth as directed. 1 mg twice a week and 0.5 mg once a week 09/03/20   Shamleffer, Melanie Crazier, MD  escitalopram (LEXAPRO) 10  MG tablet Take 1 tablet (10 mg total) by mouth daily. 08/25/21   Shelda Pal, DO  famotidine (PEPCID) 20 MG tablet Take 20 mg by mouth 2 (two) times daily.    [provider]  fenofibrate (TRICOR) 145 MG tablet Take 1 tablet (145 mg total) by mouth daily. 08/25/21   Shelda Pal, DO  hydrOXYzine (ATARAX/VISTARIL) 25 MG tablet TAKE 1-3 TABLETS (25-75 MG TOTAL) BY MOUTH 3 (THREE) TIMES DAILY AS NEEDED FOR ANXIETY. 01/22/18   Shelda Pal, DO  metFORMIN (GLUCOPHAGE) 500 MG tablet Take 1 tablet (500 mg total) by mouth 2 (two) times daily with a meal. 08/25/21   Wendling, Crosby Oyster, DO  ondansetron (ZOFRAN) 8 MG tablet Take 1 tablet (8 mg total) by mouth as needed for nausea or vomiting. 06/28/21   Tomi Likens, Adam R, DO  pantoprazole (PROTONIX) 40 MG tablet Take 1 tablet (40 mg total) by mouth 2 (two) times daily. 08/25/21   Shelda Pal, DO  QUEtiapine (SEROQUEL) 300 MG tablet Take 1 tablet (300 mg total) by mouth at bedtime. 04/23/21   Shelda Pal, DO  Riboflavin 400 MG CAPS Take 400 mg by mouth daily.    [provider]  Rimegepant Sulfate (NURTEC) 75 MG TBDP Take 75 mg by mouth as needed (Daily as needed for a Migraine). Maximum 1 tablet in 24 hours.  Quantity 8. 06/28/21   Tomi Likens, Adam R, DO  rosuvastatin (CRESTOR) 20 MG tablet Take 1 tablet (20 mg total) by mouth daily. 08/25/21  Shelda Pal, DO  traMADol (ULTRAM) 50 MG tablet Take 1 tablet (50 mg total) by mouth every 6 (six) hours as needed. 09/24/21   Pieter Partridge, DO      Allergies    Aspirin, Chlorhexidine, Ibuprofen, Iodine, Lunesta [eszopiclone], Nsaids, Shellfish allergy, Sulfa antibiotics, Triptans, and Tape    Review of Systems   Review of Systems  Neurological:  Positive for headaches.    Physical Exam Updated Vital Signs BP 112/80 (BP Location: Left Arm)   Pulse 95   Temp 98.6 F (37 C) (Oral)   Resp 16   Ht '5\' 6"'$  (1.676 m)   Wt 117.9 kg   LMP  10/11/2021 (Exact Date)   SpO2 97%   BMI 41.97 kg/m  Physical Exam Vitals and nursing note reviewed.  Constitutional:      General: She is not in acute distress.    Appearance: She is well-developed. She is not ill-appearing, toxic-appearing or diaphoretic.     Comments: Appears uncomfortable  HENT:     Head: Normocephalic and atraumatic.  Eyes:     General: Lids are normal. Gaze aligned appropriately.     Extraocular Movements: Extraocular movements intact.     Conjunctiva/sclera: Conjunctivae normal.     Pupils: Pupils are equal, round, and reactive to light.  Neck:     Comments: Very supple on exam, no meningismus or torticollis Cardiovascular:     Rate and Rhythm: Normal rate and regular rhythm.     Heart sounds: No murmur heard. Pulmonary:     Effort: Pulmonary effort is normal. No respiratory distress.     Breath sounds: Normal breath sounds.  Abdominal:     Palpations: Abdomen is soft.     Tenderness: There is no abdominal tenderness.  Musculoskeletal:        General: No swelling.     Cervical back: Neck supple. No rigidity or crepitus. No pain with movement.  Skin:    General: Skin is warm and dry.     Capillary Refill: Capillary refill takes less than 2 seconds.     Coloration: Skin is not jaundiced or pale.  Neurological:     General: No focal deficit present.     Mental Status: She is alert and oriented to person, place, and time.     Sensory: No sensory deficit.     Motor: No weakness.     Coordination: Coordination normal.     Gait: Gait normal.     Comments: Without temporal tenderness, facial asymmetry, dysarthria, tremor, seizure activity, discoordination  Psychiatric:        Mood and Affect: Mood normal.     ED Results / Procedures / Treatments   Labs (all labs ordered are listed, but only abnormal results are displayed) Labs Reviewed  BASIC METABOLIC PANEL - Abnormal; Notable for the following components:      Result Value   Glucose, Bld 134 (*)     Creatinine, Ser 1.13 (*)    All other components within normal limits  CBC - Abnormal; Notable for the following components:   Hemoglobin 11.6 (*)    HCT 35.3 (*)    All other components within normal limits  HCG, QUANTITATIVE, PREGNANCY    EKG None  Radiology No results found.  Procedures Procedures  {Document cardiac monitor, telemetry assessment procedure when appropriate:1}  Medications Ordered in ED Medications  sodium chloride 0.9 % bolus 1,000 mL (1,000 mLs Intravenous New Bag/Given 10/19/21 2052)  metoCLOPramide (REGLAN) injection 10 mg (  10 mg Intravenous Given 10/19/21 2056)  diphenhydrAMINE (BENADRYL) injection 25 mg (25 mg Intravenous Given 10/19/21 2052)  dexamethasone (DECADRON) injection 4 mg (4 mg Intravenous Given 10/19/21 2100)    ED Course/ Medical Decision Making/ A&P                           Medical Decision Making Amount and/or Complexity of Data Reviewed Labs: ordered.  Risk Prescription drug management.   41 y.o. female presents to the ED for concern of Migraine   This involves an extensive number of treatment options, and is a complaint that carries with it a high risk of complications and morbidity.  The emergent differential diagnosis prior to evaluation includes, but is not limited to: ***  This is not an exhaustive differential.   Past Medical History / Co-morbidities / Social History: Hx of chronic migraines, asthma, depression, anxiety, GERD, history of pituitary adenoma Social Determinants of Health include: None  Additional History:  Internal and external records from outside source obtained and reviewed including recent ED visits including 08/31/2021.  Was seen for migraine, see note for details.  Lab Tests: I ordered, and personally interpreted labs.  The pertinent results include:   Glucose 134 Creatinine 1.13 Hgb 11.6, HCT 35.3, mildly decreased from 1 year ago hCG quantitative negative  Imaging Studies: None  ED Course: Pt  well-appearing on exam.  Presenting with HA.  Hx of migraines, attempted abortive therapy without success.  Nontoxic, nonseptic appearing in NAD.  AAOx4.  Presentation is like pts typical HA and non concerning for [facility], ICH, Meningitis, or temporal arteritis.  Therefore, I believe imaging and extensive labs may be deferred at this time.  Pt is afebrile with no focal neuro deficits, nuchal rigidity, or change in vision.  With some nausea however no vomiting.  Ordered fluid bolus and migraine cocktail.  Plan to reassess.   Labs unremarkable.  Upon re-evaluation, moderate improvement appreciated while in ED.  Recommend rest, hydration, avoidance of triggers, and continued conservative management.  Instructed to follow up closely with PCP/Neurology for re-evaluation and possible medication regimen adjustment.  Pt satisfied with today's encounter.   Patient in NAD and in good condition at time of discharge.  Disposition: After consideration the patient's encounter today, I do not feel today's workup suggests an emergent condition requiring admission or immediate intervention beyond what has been performed at this time.  Safe for discharge; instructed to return immediately for worsening symptoms, change in symptoms or any other concerns.  I have reviewed the patients home medicines and have made adjustments as needed.  Discussed course of treatment with the patient, whom demonstrated understanding.  Patient in agreement and has no further questions.     This chart was dictated using voice recognition software.  Despite best efforts to proofread, errors can occur which can change the documentation meaning.   {Document critical care time when appropriate:1} {Document review of labs and clinical decision tools ie heart score, Chads2Vasc2 etc:1}  {Document your independent review of radiology images, and any outside records:1} {Document your discussion with family members, caretakers, and with  consultants:1} {Document social determinants of health affecting pt's care:1} {Document your decision making why or why not admission, treatments were needed:1} Final Clinical Impression(s) / ED Diagnoses Final diagnoses:  Migraine without aura and without status migrainosus, not intractable    Rx / DC Orders ED Discharge Orders     None

## 2021-11-05 ENCOUNTER — Emergency Department (HOSPITAL_BASED_OUTPATIENT_CLINIC_OR_DEPARTMENT_OTHER)
Admission: EM | Admit: 2021-11-05 | Discharge: 2021-11-06 | Disposition: A | Discharge status: Home / Self Care | Attending: Emergency Medicine | Admitting: Emergency Medicine

## 2021-11-05 ENCOUNTER — Other Ambulatory Visit: Payer: Self-pay

## 2021-11-05 ENCOUNTER — Encounter (HOSPITAL_BASED_OUTPATIENT_CLINIC_OR_DEPARTMENT_OTHER): Payer: Self-pay

## 2021-11-05 DIAGNOSIS — S30860A Insect bite (nonvenomous) of lower back and pelvis, initial encounter: Secondary | ICD-10-CM | POA: Diagnosis not present

## 2021-11-05 DIAGNOSIS — T63444A Toxic effect of venom of bees, undetermined, initial encounter: Secondary | ICD-10-CM

## 2021-11-05 DIAGNOSIS — G43809 Other migraine, not intractable, without status migrainosus: Secondary | ICD-10-CM | POA: Diagnosis not present

## 2021-11-05 DIAGNOSIS — W57XXXA Bitten or stung by nonvenomous insect and other nonvenomous arthropods, initial encounter: Secondary | ICD-10-CM | POA: Diagnosis not present

## 2021-11-05 DIAGNOSIS — R519 Headache, unspecified: Secondary | ICD-10-CM | POA: Diagnosis present

## 2021-11-05 DIAGNOSIS — Z79899 Other long term (current) drug therapy: Secondary | ICD-10-CM | POA: Insufficient documentation

## 2021-11-05 LAB — PREGNANCY, URINE: Preg Test, Ur: NEGATIVE

## 2021-11-05 MED ORDER — FAMOTIDINE 20 MG PO TABS
40.0000 mg | ORAL_TABLET | Freq: Once | ORAL | Status: AC
  Administered 2021-11-05: 40 mg via ORAL
  Filled 2021-11-05: qty 2

## 2021-11-05 MED ORDER — ONDANSETRON 4 MG PO TBDP
4.0000 mg | ORAL_TABLET | Freq: Once | ORAL | Status: AC
  Administered 2021-11-05: 4 mg via ORAL
  Filled 2021-11-05: qty 1

## 2021-11-05 MED ORDER — MAGNESIUM SULFATE 2 GM/50ML IV SOLN
2.0000 g | Freq: Once | INTRAVENOUS | Status: AC
Start: 2021-11-05 — End: 2021-11-06
  Administered 2021-11-05: 2 g via INTRAVENOUS
  Filled 2021-11-05: qty 50

## 2021-11-05 MED ORDER — METOCLOPRAMIDE HCL 5 MG/ML IJ SOLN
10.0000 mg | Freq: Once | INTRAMUSCULAR | Status: AC
  Administered 2021-11-05: 10 mg via INTRAVENOUS
  Filled 2021-11-05: qty 2

## 2021-11-05 MED ORDER — PREDNISONE 50 MG PO TABS
60.0000 mg | ORAL_TABLET | Freq: Once | ORAL | Status: AC
  Administered 2021-11-05: 60 mg via ORAL
  Filled 2021-11-05: qty 1

## 2021-11-05 MED ORDER — SODIUM CHLORIDE 0.9 % IV BOLUS
500.0000 mL | Freq: Once | INTRAVENOUS | Status: AC
  Administered 2021-11-05: 500 mL via INTRAVENOUS

## 2021-11-05 MED ORDER — ACETAMINOPHEN 325 MG PO TABS
650.0000 mg | ORAL_TABLET | Freq: Once | ORAL | Status: AC
  Administered 2021-11-05: 650 mg via ORAL
  Filled 2021-11-05: qty 2

## 2021-11-05 MED ORDER — DIPHENHYDRAMINE HCL 25 MG PO CAPS
50.0000 mg | ORAL_CAPSULE | Freq: Once | ORAL | Status: AC
  Administered 2021-11-05: 50 mg via ORAL
  Filled 2021-11-05: qty 2

## 2021-11-05 NOTE — ED Triage Notes (Signed)
Patient c/o bee sting x 630in the left buttocks. Patient states she is allergic.per Patient - there is no stinger that was noted by family.

## 2021-11-06 MED ORDER — PREDNISONE 20 MG PO TABS: ORAL_TABLET | ORAL | 0 refills | Status: DC

## 2021-11-06 MED ORDER — FAMOTIDINE 20 MG PO TABS: 20.0000 mg | ORAL_TABLET | Freq: Two times a day (BID) | ORAL | 0 refills | Status: DC

## 2021-11-06 NOTE — ED Notes (Signed)
MD states she does not need to talk to pt. And that prescriptions have been sent to her pharmacy and discharge paperwork has been printed.

## 2021-11-06 NOTE — ED Provider Notes (Signed)
[facility] Provider Note   CSN: [id number] Arrival date & time: 11/05/21  1926     History  Chief Complaint  Patient presents with   Insect Bite    Amber [name] is a 41 y.o. female.  The history is provided by the patient.  Illness Location:  Left buttock Quality:  Bee sting Severity:  Mild Onset quality:  Gradual Duration:  5 hours Timing:  Constant Progression:  Unchanged Chronicity:  New Context:  Bee stung patient in the buttock and caused a migraine Relieved by:  Nothing Worsened by:  Nothing Ineffective treatments:  Pepcid, benadryl and steroids prior to me seeing patient Associated symptoms: headaches   Associated symptoms: no fever        Home Medications Prior to Admission medications   Medication Sig Start Date End Date Taking? Authorizing Provider  famotidine (PEPCID) 20 MG tablet Take 1 tablet (20 mg total) by mouth 2 (two) times daily. 11/06/21  Yes Ameria Sanjurjo, MD  predniSONE (DELTASONE) 20 MG tablet 2 tabs po daily x 4 days 11/06/21  Yes Tiawana Forgy, MD  acetaminophen (TYLENOL) 500 MG tablet Take 500 mg by mouth as needed.    [provider]  cabergoline (DOSTINEX) 0.5 MG tablet Take 1 tablet (0.5 mg total) by mouth as directed. 1 mg twice a week and 0.5 mg once a week 09/03/20   Shamleffer, Melanie Crazier, MD  escitalopram (LEXAPRO) 10 MG tablet Take 1 tablet (10 mg total) by mouth daily. 08/25/21   Shelda Pal, DO  famotidine (PEPCID) 20 MG tablet Take 20 mg by mouth 2 (two) times daily.    [provider]  fenofibrate (TRICOR) 145 MG tablet Take 1 tablet (145 mg total) by mouth daily. 08/25/21   Shelda Pal, DO  hydrOXYzine (ATARAX/VISTARIL) 25 MG tablet TAKE 1-3 TABLETS (25-75 MG TOTAL) BY MOUTH 3 (THREE) TIMES DAILY AS NEEDED FOR ANXIETY. 01/22/18   Shelda Pal, DO  metFORMIN (GLUCOPHAGE) 500 MG tablet Take 1 tablet (500 mg total) by mouth 2 (two) times daily  with a meal. 08/25/21   Wendling, Crosby Oyster, DO  ondansetron (ZOFRAN) 8 MG tablet Take 1 tablet (8 mg total) by mouth as needed for nausea or vomiting. 06/28/21   Tomi Likens, Adam R, DO  pantoprazole (PROTONIX) 40 MG tablet Take 1 tablet (40 mg total) by mouth 2 (two) times daily. 08/25/21   Shelda Pal, DO  QUEtiapine (SEROQUEL) 300 MG tablet Take 1 tablet (300 mg total) by mouth at bedtime. 04/23/21   Shelda Pal, DO  Riboflavin 400 MG CAPS Take 400 mg by mouth daily.    [provider]  Rimegepant Sulfate (NURTEC) 75 MG TBDP Take 75 mg by mouth as needed (Daily as needed for a Migraine). Maximum 1 tablet in 24 hours.  Quantity 8. 06/28/21   Tomi Likens, Adam R, DO  rosuvastatin (CRESTOR) 20 MG tablet Take 1 tablet (20 mg total) by mouth daily. 08/25/21   Wendling, Crosby Oyster, DO  traMADol (ULTRAM) 50 MG tablet Take 1 tablet (50 mg total) by mouth every 6 (six) hours as needed. 09/24/21   Pieter Partridge, DO      Allergies    Aspirin, Chlorhexidine, Ibuprofen, Iodine, Lunesta [eszopiclone], Nsaids, Shellfish allergy, Sulfa antibiotics, Triptans, and Tape    Review of Systems   Review of Systems  Constitutional:  Negative for fever.  Neurological:  Positive for headaches.  All other systems reviewed and are negative.   Physical  Exam Updated Vital Signs BP 118/89   Pulse 94   Temp 98.4 F (36.9 C) (Oral)   Resp 18   Ht '5\' 6"'$  (1.676 m)   Wt 122.5 kg   LMP 10/12/2021 (Exact Date)   SpO2 98%   BMI 43.58 kg/m  Physical Exam Vitals and nursing note reviewed. Exam conducted with a chaperone present.  Constitutional:      General: She is not in acute distress.    Appearance: She is well-developed.  HENT:     Head: Normocephalic and atraumatic.     Nose: Nose normal.  Eyes:     Pupils: Pupils are equal, round, and reactive to light.  Cardiovascular:     Rate and Rhythm: Normal rate and regular rhythm.     Pulses: Normal pulses.     Heart sounds: Normal heart  sounds.  Pulmonary:     Effort: Pulmonary effort is normal. No respiratory distress.     Breath sounds: Normal breath sounds.  Abdominal:     General: Abdomen is flat. Bowel sounds are normal. There is no distension.     Palpations: Abdomen is soft.     Tenderness: There is no abdominal tenderness. There is no guarding or rebound.  Genitourinary:    Vagina: No vaginal discharge.     Comments: No hives, no sign of stinger or wound on bottom  Musculoskeletal:        General: Normal range of motion.     Cervical back: Neck supple.  Skin:    General: Skin is dry.     Capillary Refill: Capillary refill takes less than 2 seconds.     Findings: No erythema or rash.  Neurological:     General: No focal deficit present.     Deep Tendon Reflexes: Reflexes normal.  Psychiatric:        Mood and Affect: Mood normal.     ED Results / Procedures / Treatments   Labs (all labs ordered are listed, but only abnormal results are displayed) Labs Reviewed  PREGNANCY, URINE    EKG None  Radiology No results found.  Procedures Procedures    Medications Ordered in ED Medications  ondansetron (ZOFRAN-ODT) disintegrating tablet 4 mg (4 mg Oral Given 11/05/21 1946)  diphenhydrAMINE (BENADRYL) capsule 50 mg (50 mg Oral Given 11/05/21 1949)  famotidine (PEPCID) tablet 40 mg (40 mg Oral Given 11/05/21 1949)  predniSONE (DELTASONE) tablet 60 mg (60 mg Oral Given 11/05/21 1947)  acetaminophen (TYLENOL) tablet 650 mg (650 mg Oral Given 11/05/21 2327)  magnesium sulfate IVPB 2 g 50 mL (0 g Intravenous Stopped 11/06/21 0024)  metoCLOPramide (REGLAN) injection 10 mg (10 mg Intravenous Given 11/05/21 2327)  sodium chloride 0.9 % bolus 500 mL ( Intravenous Stopped 11/06/21 0002)    ED Course/ Medical Decision Making/ A&P                           Medical Decision Making Bee sting that caused a migraine   Amount and/or Complexity of Data Reviewed External Data Reviewed: notes.    Details: previous  notes reviewed. Labs: ordered.    Details: pregnancy is negative  Risk OTC drugs. Prescription drug management. Risk Details: Allergy medication given prior to me coming on shift.  Well appearing.  No swelling of the face or lips.  Migraine cocktail given.  Patient improved sent home on steroids and pepcid and benadryl for bee sting.    aking why or why  not admission, treatments were needed:1} Final Clinical Impression(s) / ED Diagnoses Final diagnoses:  Bee sting, undetermined intent, initial encounter  Other migraine without status migrainosus, not intractable   Return for intractable cough, coughing up blood, fevers > 100.4 unrelieved by medication, shortness of breath, intractable vomiting, chest pain, shortness of breath, weakness, numbness, changes in speech, facial asymmetry, abdominal pain, passing out, Inability to tolerate liquids or food, cough, altered mental status or any concerns. No signs of systemic illness or infection. The patient is nontoxic-appearing on exam and vital signs are within normal limits.  I have reviewed the triage vital signs and the nursing notes. Pertinent labs & imaging results that were available during my care of the patient were reviewed by me and considered in my medical decision making (see chart for details). After history, exam, and medical workup I feel the patient has been appropriately medically screened and is safe for discharge home. Pertinent diagnoses were discussed with the patient. Patient was given return precautions.  Rx / DC Orders ED Discharge Orders          Ordered    predniSONE (DELTASONE) 20 MG tablet        11/06/21 0016    famotidine (PEPCID) 20 MG tablet  2 times daily        11/06/21 0016              Shareeka Yim, MD 11/06/21 4734

## 2021-11-24 ENCOUNTER — Ambulatory Visit (INDEPENDENT_AMBULATORY_CARE_PROVIDER_SITE_OTHER): Admitting: Family Medicine

## 2021-11-24 ENCOUNTER — Encounter: Payer: Self-pay | Admitting: Family Medicine

## 2021-11-24 ENCOUNTER — Encounter: Payer: Self-pay | Admitting: Neurology

## 2021-11-24 ENCOUNTER — Other Ambulatory Visit: Payer: Self-pay | Admitting: Neurology

## 2021-11-24 VITALS — BP 108/62 | HR 106 | Temp 98.4°F | Ht 66.0 in | Wt 284.1 lb

## 2021-11-24 DIAGNOSIS — R252 Cramp and spasm: Secondary | ICD-10-CM

## 2021-11-24 MED ORDER — EMGALITY 120 MG/ML ~~LOC~~ SOAJ: 120.0000 mg | SUBCUTANEOUS | 5 refills | Status: DC

## 2021-11-24 MED ORDER — EMGALITY 120 MG/ML ~~LOC~~ SOAJ: 240.0000 mg | Freq: Once | SUBCUTANEOUS | 0 refills | Status: AC

## 2021-11-24 MED ORDER — TIZANIDINE HCL 4 MG PO TABS: 4.0000 mg | ORAL_TABLET | Freq: Four times a day (QID) | ORAL | 0 refills | Status: DC | PRN

## 2021-11-24 NOTE — Patient Instructions (Signed)
Try to drink 55-60 oz of water daily outside of exercise.  Heat (pad or rice pillow in microwave) over affected area, 10-15 minutes twice daily.   Stretch as tolerated.   Let Amber [name] know if you need anything.

## 2021-11-24 NOTE — Progress Notes (Signed)
Chief Complaint  Patient presents with   Depression    Muscle pain when stretching   Subjective: Patient is a 41 y.o. female here for cramping.  Over the past month, the patient has had tight muscles and cramping.  Denies any injury or change in diet.  She drinks around 30 ounces of water daily.  Each side is equally affected.  When she gets up to stretch, she feels tightness in her shoulders and thighs.  She has not tried anything at home so far.  She is not having any weakness.  Her low back will sometimes cramp on her as well.  Past Medical History:  Diagnosis Date   Anxiety    Asthma    Depression    GERD (gastroesophageal reflux disease)    History of chicken pox    History of migraine headaches    Pituitary adenoma (HCC)    Seizures (HCC)     Objective: BP 108/62   Pulse (!) 106   Temp 98.4 F (36.9 C) (Oral)   Ht '5\' 6"'$  (1.676 m)   Wt 284 lb 2 oz (128.9 kg)   LMP 10/12/2021 (Exact Date)   SpO2 97%   BMI 45.86 kg/m  General: Awake, appears stated age MSK: TTP over the deltoids b/l, lumbar parasp msc b/l and anterolateral quads b/l Neuro: DTR's equal and symmetric throughout, no clonus, no cerebellar signs, gait nml Lungs: No accessory muscle use Psych: Age appropriate judgment and insight, normal affect and mood  Assessment and Plan: Muscle cramping - Plan: tiZANidine (ZANAFLEX) 4 MG tablet, Magnesium, Comprehensive metabolic panel, VITAMIN D 25 Hydroxy (Vit-D Deficiency, Fractures)  Zanaflex prn. Warned of drowsiness. Needs to increase hydration to 55-60 oz of water daily. Ck labs. PT if no better. The patient voiced understanding and agreement to the plan.  [place], DO 11/24/21  2:55 PM

## 2021-11-25 ENCOUNTER — Other Ambulatory Visit: Payer: Self-pay

## 2021-11-25 ENCOUNTER — Encounter (HOSPITAL_BASED_OUTPATIENT_CLINIC_OR_DEPARTMENT_OTHER): Payer: Self-pay

## 2021-11-25 ENCOUNTER — Other Ambulatory Visit: Payer: Self-pay | Admitting: Family Medicine

## 2021-11-25 ENCOUNTER — Other Ambulatory Visit: Payer: Self-pay | Admitting: Neurology

## 2021-11-25 ENCOUNTER — Emergency Department (HOSPITAL_BASED_OUTPATIENT_CLINIC_OR_DEPARTMENT_OTHER)
Admission: EM | Admit: 2021-11-25 | Discharge: 2021-11-26 | Disposition: A | Discharge status: Home / Self Care | Attending: Emergency Medicine | Admitting: Emergency Medicine

## 2021-11-25 DIAGNOSIS — N9489 Other specified conditions associated with female genital organs and menstrual cycle: Secondary | ICD-10-CM | POA: Diagnosis not present

## 2021-11-25 DIAGNOSIS — G43809 Other migraine, not intractable, without status migrainosus: Secondary | ICD-10-CM | POA: Insufficient documentation

## 2021-11-25 DIAGNOSIS — R519 Headache, unspecified: Secondary | ICD-10-CM | POA: Diagnosis present

## 2021-11-25 DIAGNOSIS — Z7984 Long term (current) use of oral hypoglycemic drugs: Secondary | ICD-10-CM | POA: Diagnosis not present

## 2021-11-25 LAB — COMPREHENSIVE METABOLIC PANEL
ALT: 20 U/L (ref 0–35)
AST: 17 U/L (ref 0–37)
Albumin: 4 g/dL (ref 3.5–5.2)
Alkaline Phosphatase: 40 U/L (ref 39–117)
BUN: 10 mg/dL (ref 6–23)
CO2: 25 mEq/L (ref 19–32)
Calcium: 9.5 mg/dL (ref 8.4–10.5)
Chloride: 104 mEq/L (ref 96–112)
Creatinine, Ser: 1.08 mg/dL (ref 0.40–1.20)
GFR: 63.65 mL/min (ref >60.00)
Glucose, Bld: 121 mg/dL — ABNORMAL HIGH (ref 70–99)
Potassium: 4.5 mEq/L (ref 3.5–5.1)
Sodium: 138 mEq/L (ref 135–145)
Total Bilirubin: 0.4 mg/dL (ref 0.2–1.2)
Total Protein: 6.7 g/dL (ref 6.0–8.3)

## 2021-11-25 LAB — HCG, SERUM, QUALITATIVE: Preg, Serum: NEGATIVE

## 2021-11-25 LAB — MAGNESIUM: Magnesium: 1.7 mg/dL (ref 1.5–2.5)

## 2021-11-25 LAB — VITAMIN D 25 HYDROXY (VIT D DEFICIENCY, FRACTURES): VITD: 10.47 ng/mL — ABNORMAL LOW (ref 30.00–100.00)

## 2021-11-25 MED ORDER — METOCLOPRAMIDE HCL 5 MG/ML IJ SOLN
10.0000 mg | Freq: Once | INTRAMUSCULAR | Status: AC
  Administered 2021-11-25: 10 mg via INTRAVENOUS
  Filled 2021-11-25: qty 2

## 2021-11-25 MED ORDER — MAGNESIUM SULFATE 2 GM/50ML IV SOLN
2.0000 g | Freq: Once | INTRAVENOUS | Status: AC
Start: 2021-11-25 — End: 2021-11-26
  Administered 2021-11-25: 2 g via INTRAVENOUS
  Filled 2021-11-25: qty 50

## 2021-11-25 MED ORDER — NURTEC 75 MG PO TBDP
75.0000 mg | ORAL_TABLET | ORAL | 5 refills | Status: DC | PRN
Start: 2021-11-25 — End: 2022-06-22

## 2021-11-25 MED ORDER — SODIUM CHLORIDE 0.9 % IV BOLUS
500.0000 mL | Freq: Once | INTRAVENOUS | Status: AC
  Administered 2021-11-25: 500 mL via INTRAVENOUS

## 2021-11-25 MED ORDER — DIPHENHYDRAMINE HCL 50 MG/ML IJ SOLN
12.5000 mg | Freq: Once | INTRAMUSCULAR | Status: AC
  Administered 2021-11-25: 12.5 mg via INTRAVENOUS
  Filled 2021-11-25: qty 1

## 2021-11-25 MED ORDER — VITAMIN D (ERGOCALCIFEROL) 1.25 MG (50000 UNIT) PO CAPS: 50000.0000 [IU] | ORAL_CAPSULE | ORAL | 0 refills | Status: DC

## 2021-11-25 NOTE — ED Triage Notes (Signed)
Complaining of a migraine since the 4th of September, nothing seems to break it, also complaining of pain in the rt ear after using a q-tip to clean out ear yesterday.

## 2021-11-26 ENCOUNTER — Other Ambulatory Visit: Payer: Self-pay | Admitting: Family Medicine

## 2021-11-26 DIAGNOSIS — E559 Vitamin D deficiency, unspecified: Secondary | ICD-10-CM

## 2021-11-26 MED ORDER — KETAMINE HCL 10 MG/ML IJ SOLN
20.0000 mg | Freq: Once | INTRAMUSCULAR | Status: AC
  Administered 2021-11-26: 20 mg via INTRAVENOUS
  Filled 2021-11-26: qty 1

## 2021-11-26 NOTE — ED Provider Notes (Signed)
[facility] Provider Note   CSN: [id number] Arrival date & time: 11/25/21  2024     History  Chief Complaint  Patient presents with   Migraine    Amber [name] is a 41 y.o. female.  The history is provided by the patient.  Migraine This is a recurrent problem. The current episode started more than 2 days ago. The problem occurs constantly. The problem has not changed since onset.Associated symptoms include headaches. Pertinent negatives include no chest pain, no abdominal pain and no shortness of breath. Nothing aggravates the symptoms. Nothing relieves the symptoms. Treatments tried: home nurtec. The treatment provided no relief.  Migraine typical on left side x 3.5 days.  No fevers, no atypical features.       Home Medications Prior to Admission medications   Medication Sig Start Date End Date Taking? Authorizing Provider  acetaminophen (TYLENOL) 500 MG tablet Take 500 mg by mouth as needed.    [provider]  cabergoline (DOSTINEX) 0.5 MG tablet Take 1 tablet (0.5 mg total) by mouth as directed. 1 mg twice a week and 0.5 mg once a week 09/03/20   Shamleffer, Melanie Crazier, MD  escitalopram (LEXAPRO) 10 MG tablet Take 1 tablet (10 mg total) by mouth daily. 08/25/21   Shelda Pal, DO  famotidine (PEPCID) 20 MG tablet Take 20 mg by mouth 2 (two) times daily.    [provider]  famotidine (PEPCID) 20 MG tablet Take 1 tablet (20 mg total) by mouth 2 (two) times daily. 11/06/21   Makyiah Lie, MD  fenofibrate (TRICOR) 145 MG tablet Take 1 tablet (145 mg total) by mouth daily. 08/25/21   Wendling, Crosby Oyster, DO  Galcanezumab-gnlm (EMGALITY) 120 MG/ML SOAJ Inject 120 mg into the skin every 28 (twenty-eight) days. 11/24/21   Tomi Likens, Adam R, DO  hydrOXYzine (ATARAX/VISTARIL) 25 MG tablet TAKE 1-3 TABLETS (25-75 MG TOTAL) BY MOUTH 3 (THREE) TIMES DAILY AS NEEDED FOR ANXIETY. 01/22/18   Shelda Pal, DO  metFORMIN  (GLUCOPHAGE) 500 MG tablet Take 1 tablet (500 mg total) by mouth 2 (two) times daily with a meal. 08/25/21   Wendling, Crosby Oyster, DO  ondansetron (ZOFRAN) 8 MG tablet Take 1 tablet (8 mg total) by mouth as needed for nausea or vomiting. 06/28/21   Tomi Likens, Adam R, DO  pantoprazole (PROTONIX) 40 MG tablet Take 1 tablet (40 mg total) by mouth 2 (two) times daily. 08/25/21   Shelda Pal, DO  predniSONE (DELTASONE) 20 MG tablet 2 tabs po daily x 4 days 11/06/21   Nadiah Corbit, MD  QUEtiapine (SEROQUEL) 300 MG tablet Take 1 tablet (300 mg total) by mouth at bedtime. 04/23/21   Shelda Pal, DO  Riboflavin 400 MG CAPS Take 400 mg by mouth daily.    [provider]  Rimegepant Sulfate (NURTEC) 75 MG TBDP Take 75 mg by mouth as needed (Daily as needed for a Migraine). Maximum 1 tablet in 24 hours.  Quantity 8. 11/25/21   Tomi Likens, Adam R, DO  rosuvastatin (CRESTOR) 20 MG tablet Take 1 tablet (20 mg total) by mouth daily. 08/25/21   Shelda Pal, DO  tiZANidine (ZANAFLEX) 4 MG tablet Take 1 tablet (4 mg total) by mouth every 6 (six) hours as needed for muscle spasms. 11/24/21   Wendling, Crosby Oyster, DO  traMADol (ULTRAM) 50 MG tablet Take 1 tablet (50 mg total) by mouth every 6 (six) hours as needed. 09/24/21   Pieter Partridge, DO  Vitamin D, Ergocalciferol, (DRISDOL) 1.25 MG (50000 UNIT) CAPS capsule Take 1 capsule (50,000 Units total) by mouth every 7 (seven) days. 11/25/21   Shelda Pal, DO      Allergies    Aspirin, Beeswax, Chlorhexidine, Ibuprofen, Iodine, Lunesta [eszopiclone], Nsaids, Shellfish allergy, Sulfa antibiotics, Triptans, and Tape    Review of Systems   Review of Systems  Constitutional:  Negative for fever.  HENT:  Negative for facial swelling.   Eyes:  Negative for redness.  Respiratory:  Negative for shortness of breath.   Cardiovascular:  Negative for chest pain.  Gastrointestinal:  Negative for abdominal pain.  Neurological:  Positive for  headaches.  All other systems reviewed and are negative.   Physical Exam Updated Vital Signs BP 112/63   Pulse 81   Resp 18   Ht '5\' 6"'$  (1.676 m)   Wt 128.8 kg   LMP 11/09/2021 (Exact Date)   SpO2 97%   BMI 45.84 kg/m  Physical Exam Vitals and nursing note reviewed. Exam conducted with a chaperone present.  Constitutional:      General: She is not in acute distress.    Appearance: Normal appearance. She is well-developed.  HENT:     Head: Normocephalic and atraumatic.     Nose: Nose normal.  Eyes:     Extraocular Movements: Extraocular movements intact.     Conjunctiva/sclera: Conjunctivae normal.     Pupils: Pupils are equal, round, and reactive to light.  Cardiovascular:     Rate and Rhythm: Normal rate and regular rhythm.     Pulses: Normal pulses.     Heart sounds: Normal heart sounds.  Pulmonary:     Effort: Pulmonary effort is normal. No respiratory distress.     Breath sounds: Normal breath sounds.  Abdominal:     General: Bowel sounds are normal. There is no distension.     Palpations: Abdomen is soft.     Tenderness: There is no abdominal tenderness. There is no guarding or rebound.  Genitourinary:    Vagina: No vaginal discharge.  Musculoskeletal:        General: Normal range of motion.     Cervical back: Normal range of motion and neck supple.  Skin:    General: Skin is warm and dry.     Capillary Refill: Capillary refill takes less than 2 seconds.     Findings: No erythema or rash.  Neurological:     General: No focal deficit present.     Mental Status: She is alert.     Deep Tendon Reflexes: Reflexes normal.  Psychiatric:        Mood and Affect: Mood normal.     ED Results / Procedures / Treatments   Labs (all labs ordered are listed, but only abnormal results are displayed) Labs Reviewed  HCG, SERUM, QUALITATIVE    EKG None  Radiology No results found.  Procedures Procedures    Medications Ordered in ED Medications   metoCLOPramide (REGLAN) injection 10 mg (10 mg Intravenous Given 11/25/21 2332)  sodium chloride 0.9 % bolus 500 mL ( Intravenous Stopped 11/26/21 0021)  magnesium sulfate IVPB 2 g 50 mL (2 g Intravenous New Bag/Given 11/25/21 2338)  diphenhydrAMINE (BENADRYL) injection 12.5 mg (12.5 mg Intravenous Given 11/25/21 2332)  ketamine (KETALAR) injection 20 mg (20 mg Intravenous Given 11/26/21 0041)    ED Course/ Medical Decision Making/ A&P  Medical Decision Making Migraine x 3.5 days   Problems Addressed: Other migraine without status migrainosus, not intractable: acute illness or injury    Details: Treated with marked improved   Amount and/or Complexity of Data Reviewed External Data Reviewed: notes.    Details: Previous notes reviewed Labs: ordered.    Details: Pregnancy negative   Risk Prescription drug management. Risk Details: Patient without improved with migraine cocktail.  Narcotics cause rebound and I do not use these.  Ketamine given with marked improvement in symptoms.  Well appearing disks sharp.  I do not believe this is meningitis.  ICH or cavernous sinus thrombosis.  Stable for discharge.     g why or why not admission, treatments were needed:1} Final Clinical Impression(s) / ED Diagnoses Final diagnoses:  Other migraine without status migrainosus, not intractable  Return for intractable cough, coughing up blood, fevers > 100.4 unrelieved by medication, shortness of breath, intractable vomiting, chest pain, shortness of breath, weakness, numbness, changes in speech, facial asymmetry, abdominal pain, passing out, Inability to tolerate liquids or food, cough, altered mental status or any concerns. No signs of systemic illness or infection. The patient is nontoxic-appearing on exam and vital signs are within normal limits.  I have reviewed the triage vital signs and the nursing notes. Pertinent labs & imaging results that were available during my care of the  patient were reviewed by me and considered in my medical decision making (see chart for details). After history, exam, and medical workup I feel the patient has been appropriately medically screened and is safe for discharge home. Pertinent diagnoses were discussed with the patient. Patient was given return precautions.   Rx / DC Orders ED Discharge Orders     None         Shamaine Mulkern, MD 11/26/21 4403

## 2021-12-16 NOTE — Progress Notes (Deleted)
NEUROLOGY FOLLOW UP OFFICE NOTE  Amber [name] [id number]  Assessment/Plan:   1.  Hemiplegic migraine, intractable 2.  Cluster headache, infrequent 3.  Prolactinoma    1.  Migraine prevention:  Emgality 3.  Migraine Rescue:  Nurtec first line, tramadol secnd line; Zofran for nausea. 4.  Limit use of pain relievers to no more than 2 days out of week to prevent risk of rebound or medication-overuse headache. 5.  Keep headache diary 6.  Follow up in 5 months.  Subjective:  Amber [name] is a 41 year old female with prolactinoma and anxiety with depression who follows up for migraine.   UPDATE: At last visit in May, Emgality was discontinued as she wanted to try and get pregnant.  However, she started experiencing significant increase in migraines, requiring ED visits.  Due to severity and frequency of her migraines, she decided to hold off trying to get pregnant and opted to restart Emgality.  She took her starting dose ***  Rescue protocol:  Nurtec, tramadol, Zofran '8mg'$  Frequency of abortive: 2 to 3 days a week Rescue protocol:  Zofran, Nurtec (tramadol second line) Current NSAIDS:  no Current analgesics:  tramadol Current triptans:  no Current ergot:  Dostinex (for hyperprolactinemia) Current anti-emetic:  Zofran '8mg'$  Current muscle relaxants:  no Current anti-anxiolytic:  hydroxyzine Current sleep aide:  quetiapine '50mg'$  Current Antihypertensive medications:  no Current Antidepressant medications:  Lexapro '10mg'$  Current Anticonvulsant medications:  none Current CGRP inhibitor:  Emgality, Nurtec (rescue) Current Vitamins/Herbal/Supplements:  magnesium oxide '400mg'$ , riboflavin '400mg'$  Current Antihistamines/Decongestants:  Benadryl Other therapy:  no   Caffeine:  1 cup coffee rarely Alcohol:  no Smoker:  no Diet:  60 oz water daily.  Little fast food.  No soda.  Eats chicken and fish.  Not skipping meals.   Exercise:  no Depression/anxiety:  yes Other pain:  none Sleep hygiene:  Improved with quetiapine   Prolactin level still elevated, however  Workup is ongoing.  She is undergoing cardiac evaluation as well.   HISTORY: Migraines Onset:  Childhood Location:  Left sided Quality:  throbbing Initial intensity:  severe Aura:  no Prodrome:  Feels ill for 3 days prior Postdrome:  "hangover" effect for 1 day after Associated symptoms:  Left sided numbness of face, arm and leg with slight weakness of left arm and leg.  Nausea, photophobia, and phonophobia.  She has not had any new worse headache of her life, waking up from sleep Initial Duration:  Several hours to several days.  Over the summer, she had status migrainosis lasting 13-14 weeks. Initial Frequency:  3 to 4 days a week Initial Frequency of abortive medication: 3 to 4 days a week Triggers/aggravating factors:  Sleep deprivation, shifting positions Relieving factors:  Laying down in dark and quiet room Activity:  Aggravates.   Past NSAIDS:  Ibuprofen.  Cannot take NSAIDs due to GI bleed Past analgesics:  Tramadol (decreases intensity), Excedrin Past abortive triptans/ergot:  Sumatriptan (increased headache) Past muscle relaxants:  no Past anti-emetic:  Reglan (contraindicated with cabergoline), Zofran '4mg'$ , Promethazine Past antihypertensive medications:  Propranolol '80mg'$  (hypotension) Past antidepressant medications:  Prozac; venlafaxine XR '150mg'$  daily Past anticonvulsant medications:  topiramate '100mg'$  BID (stopped as trying to get pregnant) Past CGRP inhibitors:  Roselyn Meier Past vitamins/Herbal/Supplements:  no Past antihistamines/decongestants:  no Other past therapies:  Re[facility]vow Unable to afford Botox with her insurance plan.    Cluster Headache: Right orbital, stabbing, severe, associated with ptosis and conjunctival injection, lasting several hours and occurring 1 to  2 times a month.   Family history of headache:  mom   MRI of brain with and without contrast from 05/25/16 was  personally reviewed and revealed 6 x 9 mm hypoenhancing pituitary microadenoma without compression of the optic chiasm. MRI of pituitary with and without contrast from 08/31/2019 showed regression of the microadenoma since 2018.   PAST MEDICAL HISTORY: Past Medical History:  Diagnosis Date   Anxiety    Asthma    Depression    GERD (gastroesophageal reflux disease)    History of chicken pox    History of migraine headaches    Pituitary adenoma (HCC)    Seizures ([facility])     MEDICATIONS: ***   ALLERGIES: Allergies  Allergen Reactions   Aspirin    Beeswax    Chlorhexidine    Ibuprofen    Iodine    Lunesta [Eszopiclone] Other (See Comments)    GI issues   Nsaids Nausea And Vomiting   Shellfish Allergy     Tingle in mouth/throat   Sulfa Antibiotics    Triptans Other (See Comments)    Makes pain worse   Tape Rash    Blisters     FAMILY HISTORY: Family History  Problem Relation Age of Onset   Diabetes Mother    Migraines Mother    Hyperlipidemia Mother    Bipolar disorder Mother    Irritable bowel syndrome Mother    Diabetes Father    Colon cancer Father    Cancer Father        colon   Hyperlipidemia Father    Hypertension Father    Bipolar disorder Brother    Migraines Sister    Other Neg Hx        pituitary disorder   Esophageal cancer Neg Hx       Objective:  *** General: No acute distress.  Patient appears well-groomed.      Metta Clines, DO  CC: Riki Sheer, DO

## 2021-12-20 ENCOUNTER — Encounter: Payer: Self-pay | Admitting: Neurology

## 2021-12-20 ENCOUNTER — Ambulatory Visit: Admitting: Neurology

## 2021-12-20 DIAGNOSIS — Z029 Encounter for administrative examinations, unspecified: Secondary | ICD-10-CM

## 2022-01-01 ENCOUNTER — Other Ambulatory Visit: Payer: Self-pay | Admitting: Neurology

## 2022-01-04 ENCOUNTER — Encounter: Payer: Self-pay | Admitting: Neurology

## 2022-01-04 ENCOUNTER — Other Ambulatory Visit: Payer: Self-pay | Admitting: Neurology

## 2022-01-04 MED ORDER — TRAMADOL HCL 50 MG PO TABS: 50.0000 mg | ORAL_TABLET | Freq: Four times a day (QID) | ORAL | 2 refills | Status: DC | PRN

## 2022-01-11 ENCOUNTER — Encounter (HOSPITAL_BASED_OUTPATIENT_CLINIC_OR_DEPARTMENT_OTHER): Payer: Self-pay

## 2022-01-11 ENCOUNTER — Emergency Department (HOSPITAL_BASED_OUTPATIENT_CLINIC_OR_DEPARTMENT_OTHER)
Admission: EM | Admit: 2022-01-11 | Discharge: 2022-01-12 | Disposition: A | Discharge status: Home / Self Care | Attending: Emergency Medicine | Admitting: Emergency Medicine

## 2022-01-11 ENCOUNTER — Other Ambulatory Visit: Payer: Self-pay

## 2022-01-11 DIAGNOSIS — R519 Headache, unspecified: Secondary | ICD-10-CM | POA: Diagnosis present

## 2022-01-11 DIAGNOSIS — G43909 Migraine, unspecified, not intractable, without status migrainosus: Secondary | ICD-10-CM | POA: Insufficient documentation

## 2022-01-11 LAB — COMPREHENSIVE METABOLIC PANEL
ALT: 21 U/L (ref 0–44)
AST: 23 U/L (ref 15–41)
Albumin: 3.6 g/dL (ref 3.5–5.0)
Alkaline Phosphatase: 38 U/L (ref 38–126)
Anion gap: 5 (ref 5–15)
BUN: 10 mg/dL (ref 6–20)
CO2: 25 mmol/L (ref 22–32)
Calcium: 8.6 mg/dL — ABNORMAL LOW (ref 8.9–10.3)
Chloride: 107 mmol/L (ref 98–111)
Creatinine, Ser: 1.1 mg/dL — ABNORMAL HIGH (ref 0.44–1.00)
GFR, Estimated: >60 mL/min (ref >60)
Glucose, Bld: 112 mg/dL — ABNORMAL HIGH (ref 70–99)
Potassium: 3.8 mmol/L (ref 3.5–5.1)
Sodium: 137 mmol/L (ref 135–145)
Total Bilirubin: 0.3 mg/dL (ref 0.3–1.2)
Total Protein: 6.4 g/dL — ABNORMAL LOW (ref 6.5–8.1)

## 2022-01-11 LAB — CBC WITH DIFFERENTIAL/PLATELET
Abs Immature Granulocytes: 0.02 10*3/uL (ref 0.00–0.07)
Basophils Absolute: 0 10*3/uL (ref 0.0–0.1)
Basophils Relative: 1 %
Eosinophils Absolute: 0.2 10*3/uL (ref 0.0–0.5)
Eosinophils Relative: 3 %
HCT: 35.6 % — ABNORMAL LOW (ref 36.0–46.0)
Hemoglobin: 11.5 g/dL — ABNORMAL LOW (ref 12.0–15.0)
Immature Granulocytes: 0 %
Lymphocytes Relative: 37 %
Lymphs Abs: 2.6 10*3/uL (ref 0.7–4.0)
MCH: 26.4 pg (ref 26.0–34.0)
MCHC: 32.3 g/dL (ref 30.0–36.0)
MCV: 81.8 fL (ref 80.0–100.0)
Monocytes Absolute: 0.5 10*3/uL (ref 0.1–1.0)
Monocytes Relative: 7 %
Neutro Abs: 3.6 10*3/uL (ref 1.7–7.7)
Neutrophils Relative %: 52 %
Platelets: 306 10*3/uL (ref 150–400)
RBC: 4.35 MIL/uL (ref 3.87–5.11)
RDW: 14.1 % (ref 11.5–15.5)
WBC: 7 10*3/uL (ref 4.0–10.5)
nRBC: 0 % (ref 0.0–0.2)

## 2022-01-11 MED ORDER — DIPHENHYDRAMINE HCL 50 MG/ML IJ SOLN
INTRAMUSCULAR | Status: AC
  Filled 2022-01-11: qty 1

## 2022-01-11 MED ORDER — DIPHENHYDRAMINE HCL 50 MG/ML IJ SOLN
25.0000 mg | Freq: Once | INTRAMUSCULAR | Status: AC
  Administered 2022-01-11: 25 mg via INTRAVENOUS

## 2022-01-11 MED ORDER — SODIUM CHLORIDE 0.9 % IV BOLUS
1000.0000 mL | Freq: Once | INTRAVENOUS | Status: AC
  Administered 2022-01-11: 1000 mL via INTRAVENOUS

## 2022-01-11 MED ORDER — HYDROMORPHONE HCL 1 MG/ML IJ SOLN
1.0000 mg | Freq: Once | INTRAMUSCULAR | Status: AC
  Administered 2022-01-11: 1 mg via INTRAVENOUS
  Filled 2022-01-11: qty 1

## 2022-01-11 MED ORDER — DEXAMETHASONE SODIUM PHOSPHATE 10 MG/ML IJ SOLN
INTRAMUSCULAR | Status: AC
  Filled 2022-01-11: qty 1

## 2022-01-11 MED ORDER — DEXAMETHASONE SODIUM PHOSPHATE 10 MG/ML IJ SOLN
10.0000 mg | Freq: Once | INTRAMUSCULAR | Status: AC
Start: 2022-01-11 — End: 2022-01-11
  Administered 2022-01-11: 10 mg via INTRAVENOUS

## 2022-01-11 MED ORDER — METOCLOPRAMIDE HCL 5 MG/ML IJ SOLN
INTRAMUSCULAR | Status: AC
  Filled 2022-01-11: qty 2

## 2022-01-11 MED ORDER — METOCLOPRAMIDE HCL 5 MG/ML IJ SOLN
10.0000 mg | Freq: Once | INTRAMUSCULAR | Status: AC
  Administered 2022-01-11: 10 mg via INTRAVENOUS

## 2022-01-11 NOTE — ED Provider Notes (Signed)
[facility] DEPART[facility] Provider Note   CSN: [id number] Arrival date & time: 01/11/22  1848     History  Chief Complaint  Patient presents with   Migraine    Amber [name] is a 41 y.o. female.  Patient with a history of hemiplegic migraine presenting with her typical migraine headache involving her left head.  States symptoms gradually onset around 3 AM and are worsened by light and noise.  She took tramadol and Nurtec at home without relief.  4-5 episodes of vomiting today.  No fever.  Feels she has some left-sided weakness which she gets typically when she has these migraines which is no different than usual.  She denies thunderclap onset.  Denies any fever.  Denies any chest pain or shortness of breath.  States headache is the same character and quality as her usual migraines but not improving with her home medications.  The history is provided by the patient.  Migraine Associated symptoms include headaches. Pertinent negatives include no chest pain and no shortness of breath.       Home Medications Prior to Admission medications   Medication Sig Start Date End Date Taking? Authorizing Provider  acetaminophen (TYLENOL) 500 MG tablet Take 500 mg by mouth as needed.    [provider]  cabergoline (DOSTINEX) 0.5 MG tablet Take 1 tablet (0.5 mg total) by mouth as directed. 1 mg twice a week and 0.5 mg once a week 09/03/20   Shamleffer, Melanie Crazier, MD  escitalopram (LEXAPRO) 10 MG tablet Take 1 tablet (10 mg total) by mouth daily. 08/25/21   Shelda Pal, DO  famotidine (PEPCID) 20 MG tablet Take 20 mg by mouth 2 (two) times daily.    [provider]  famotidine (PEPCID) 20 MG tablet Take 1 tablet (20 mg total) by mouth 2 (two) times daily. 11/06/21   Palumbo, April, MD  fenofibrate (TRICOR) 145 MG tablet Take 1 tablet (145 mg total) by mouth daily. 08/25/21   Wendling, Crosby Oyster, DO  Galcanezumab-gnlm (EMGALITY) 120 MG/ML  SOAJ Inject 120 mg into the skin every 28 (twenty-eight) days. 11/24/21   Tomi Likens, Adam R, DO  hydrOXYzine (ATARAX/VISTARIL) 25 MG tablet TAKE 1-3 TABLETS (25-75 MG TOTAL) BY MOUTH 3 (THREE) TIMES DAILY AS NEEDED FOR ANXIETY. 01/22/18   Shelda Pal, DO  metFORMIN (GLUCOPHAGE) 500 MG tablet Take 1 tablet (500 mg total) by mouth 2 (two) times daily with a meal. 08/25/21   Wendling, Crosby Oyster, DO  ondansetron (ZOFRAN) 8 MG tablet Take 1 tablet (8 mg total) by mouth as needed for nausea or vomiting. 06/28/21   Tomi Likens, Adam R, DO  pantoprazole (PROTONIX) 40 MG tablet Take 1 tablet (40 mg total) by mouth 2 (two) times daily. 08/25/21   Shelda Pal, DO  predniSONE (DELTASONE) 20 MG tablet 2 tabs po daily x 4 days 11/06/21   Palumbo, April, MD  QUEtiapine (SEROQUEL) 300 MG tablet Take 1 tablet (300 mg total) by mouth at bedtime. 04/23/21   Shelda Pal, DO  Riboflavin 400 MG CAPS Take 400 mg by mouth daily.    [provider]  Rimegepant Sulfate (NURTEC) 75 MG TBDP Take 75 mg by mouth as needed (Daily as needed for a Migraine). Maximum 1 tablet in 24 hours.  Quantity 8. 11/25/21   Tomi Likens, Adam R, DO  rosuvastatin (CRESTOR) 20 MG tablet Take 1 tablet (20 mg total) by mouth daily. 08/25/21   Shelda Pal, DO  tiZANidine (ZANAFLEX) 4 MG  tablet Take 1 tablet (4 mg total) by mouth every 6 (six) hours as needed for muscle spasms. 11/24/21   Wendling, Crosby Oyster, DO  traMADol (ULTRAM) 50 MG tablet Take 1 tablet (50 mg total) by mouth every 6 (six) hours as needed. 01/04/22   Pieter Partridge, DO  Vitamin D, Ergocalciferol, (DRISDOL) 1.25 MG (50000 UNIT) CAPS capsule Take 1 capsule (50,000 Units total) by mouth every 7 (seven) days. 11/25/21   Shelda Pal, DO      Allergies    Aspirin, Beeswax, Chlorhexidine, Ibuprofen, Iodine, Lunesta [eszopiclone], Nsaids, Shellfish allergy, Sulfa antibiotics, Triptans, and Tape    Review of Systems   Review of Systems   Constitutional:  Negative for activity change, appetite change and fever.  HENT:  Negative for congestion.   Eyes:  Positive for photophobia.  Respiratory:  Negative for cough, chest tightness and shortness of breath.   Cardiovascular:  Negative for chest pain.  Gastrointestinal:  Positive for nausea and vomiting.  Genitourinary:  Negative for dysuria.  Musculoskeletal:  Negative for arthralgias and myalgias.  Neurological:  Positive for headaches.   all other systems are negative except as noted in the HPI and PMH.    Physical Exam Updated Vital Signs BP 115/66   Pulse 75   Temp 98 F (36.7 C)   Resp 16   SpO2 98%  Physical Exam Vitals and nursing note reviewed.  Constitutional:      General: She is not in acute distress.    Appearance: She is well-developed.  HENT:     Head: Normocephalic and atraumatic.     Mouth/Throat:     Pharynx: No oropharyngeal exudate.  Eyes:     Conjunctiva/sclera: Conjunctivae normal.     Pupils: Pupils are equal, round, and reactive to light.     Comments: Photophobic  Neck:     Comments: No meningismus. Cardiovascular:     Rate and Rhythm: Normal rate and regular rhythm.     Heart sounds: Normal heart sounds. No murmur heard. Pulmonary:     Effort: Pulmonary effort is normal. No respiratory distress.     Breath sounds: Normal breath sounds.  Abdominal:     Palpations: Abdomen is soft.     Tenderness: There is no abdominal tenderness. There is no guarding or rebound.  Musculoskeletal:        General: No tenderness. Normal range of motion.     Cervical back: Normal range of motion and neck supple.  Skin:    General: Skin is warm.  Neurological:     Mental Status: She is alert and oriented to person, place, and time.     Cranial Nerves: No cranial nerve deficit.     Motor: No abnormal muscle tone.     Coordination: Coordination normal.     Comments:  5/5 strength throughout. CN 2-12 intact.Equal grip strength.   Psychiatric:         Behavior: Behavior normal.     ED Results / Procedures / Treatments   Labs (all labs ordered are listed, but only abnormal results are displayed) Labs Reviewed  COMPREHENSIVE METABOLIC PANEL - Abnormal; Notable for the following components:      Result Value   Glucose, Bld 112 (*)    Creatinine, Ser 1.10 (*)    Calcium 8.6 (*)    Total Protein 6.4 (*)    All other components within normal limits  CBC WITH DIFFERENTIAL/PLATELET - Abnormal; Notable for the following components:   Hemoglobin 11.5 (*)  HCT 35.6 (*)    All other components within normal limits    EKG None  Radiology No results found.  Procedures Procedures    Medications Ordered in ED Medications  sodium chloride 0.9 % bolus 1,000 mL (has no administration in time range)  metoCLOPramide (REGLAN) injection 10 mg (has no administration in time range)  diphenhydrAMINE (BENADRYL) injection 25 mg (has no administration in time range)  dexamethasone (DECADRON) injection 10 mg (has no administration in time range)    ED Course/ Medical Decision Making/ A&P                           Medical Decision Making Amount and/or Complexity of Data Reviewed Labs: ordered. Decision-making details documented in ED Course. Radiology: ordered and independent interpretation performed. Decision-making details documented in ED Course. ECG/medicine tests: ordered and independent interpretation performed. Decision-making details documented in ED Course.  Risk Prescription drug management.  Left-sided gradual onset headache similar to previous.  Consistent with previous migraine headaches.  No thunderclap onset.  Low suspicion for meningitis, temporal arteritis, subarachnoid hemorrhage  Doubt meningitis, temporal arteritis, subarachnoid hemorrhage.  Her neurological exam is intact.  She is given IV fluids, Decadron, Reglan, Benadryl.  States allergies to all NSAIDs and cannot take Toradol.  Has required Dilaudid in the past  and is requesting that by name.  She has also received ketamine in the past and did not like the way this made her feel  Has headache which is typical for her.  Pending clinical improvement at time of signout.  Anticipate follow-up with her primary doctor and neurologist.  Care to be transferred to Dr. Roxanne Mins       Final Clinical Impression(s) / ED Diagnoses Final diagnoses:  Migraine without status migrainosus, not intractable, unspecified migraine type    Rx / DC Orders ED Discharge Orders     None         Ezequiel Essex, MD 01/11/22 2355

## 2022-01-11 NOTE — Discharge Instructions (Addendum)
Take your medications as prescribed and followup with your doctor and neurologist. Return to the ED if you develop new or worsening symptoms.

## 2022-01-11 NOTE — ED Provider Notes (Signed)
Care assumed from Dr. Wyvonnia Dusky, patient with migraine headache and has received a dose of hydromorphone.  Currently, waiting to make sure she has a clinical response to the hydromorphone.  She had excellent relief of her headache with above-noted treatment, is discharged with instructions to follow-up with PCP.   Amber Fuel, MD 86/76/19 [phone]

## 2022-01-11 NOTE — ED Triage Notes (Signed)
Hx of migraines. Similar presentation beginning at 0300. Sensitivity to light. 9/10. Pt reports taking tramadol at 0800, neurtec 1500. Unresolved. Pt admits to N/V. Pt reports mild left sided weakness that is her baseline d/t having hemiplegic migraines.

## 2022-01-12 ENCOUNTER — Telehealth: Payer: Self-pay | Admitting: Neurology

## 2022-01-12 NOTE — Telephone Encounter (Signed)
Since we restarted Emgality in early September, it appears that Amber [name] has been to the ED at least twice for intractable migraine.  She did not come in for her scheduled appointment on Oct 2 and does not have another appointment scheduled.  I would like for her to schedule an appointment to discuss whether any changes are necessary.

## 2022-01-13 NOTE — Progress Notes (Signed)
Virtual Visit via Video Note  Consent was obtained for video visit:  Yes.   Answered questions that patient had about telehealth interaction:  Yes.   I discussed the limitations, risks, security and privacy concerns of performing an evaluation and management service by telemedicine. I also discussed with the patient that there may be a patient responsible charge related to this service. The patient expressed understanding and agreed to proceed.  Pt location: Home Physician Location: office Name of referring provider:  Shelda Pal* I connected with Amber [name] at patients initiation/request on 01/14/2022 at  2:30 PM EDT by video enabled telemedicine application and verified that I am speaking with the correct person using two identifiers. Pt MRN:  [id number] Pt DOB:  [birth year] Video Participants:  Amber [name]   Assessment/Plan:   1.  Hemiplegic migraine, intractable 2.  Cluster headache, infrequent 3.  Prolactinoma    1.  Migraine prevention:  Emgality - just restarted.  Need to give it more time.   3.  Migraine Rescue:  Nurtec first line, tramadol secnd line; Zofran for nausea.  For cluster headaches, tramadol. 4.  Limit use of pain relievers to no more than 2 days out of week to prevent risk of rebound or medication-overuse headache. 5.  Keep headache diary 6.  Follow up in 4-5 months.  Subjective:  Amber [name] is a 41 year old female with prolactinoma and anxiety with depression who follows up for migraine.   UPDATE: At last visit in May, Emgality was discontinued as she wanted to try and get pregnant.  However, she started experiencing significant increase in migraines, requiring ED visits.  They were occurring 2-3 times a week, each lasting a couple fo days.  Due to severity and frequency of her migraines, she decided to hold off trying to get pregnant and opted to restart Emgality.  She took her starting dose around 20 days ago. Today she is  with a cluster headache (the first in a month).    Rescue protocol:  Nurtec, tramadol, Zofran '8mg'$  Frequency of abortive: 2 to 3 days a week Rescue protocol:  Zofran, Nurtec (tramadol second line) Current NSAIDS:  no Current analgesics:  tramadol Current triptans:  no Current ergot:  Dostinex (for hyperprolactinemia) Current anti-emetic:  Zofran '8mg'$  Current muscle relaxants:  no Current anti-anxiolytic:  hydroxyzine Current sleep aide:  quetiapine '50mg'$  Current Antihypertensive medications:  no Current Antidepressant medications:  Lexapro '10mg'$  Current Anticonvulsant medications:  none Current CGRP inhibitor:  Emgality, Nurtec (rescue) Current Vitamins/Herbal/Supplements:  magnesium oxide '400mg'$ , riboflavin '400mg'$  Current Antihistamines/Decongestants:  Benadryl Other therapy:  no   Caffeine:  1 cup coffee rarely Alcohol:  no Smoker:  no Diet:  60 oz water daily.  Little fast food.  No soda.  Eats chicken and fish.  Not skipping meals.   Exercise:  no Depression/anxiety:  yes Other pain: none Sleep hygiene:  Improved with quetiapine   Prolactin level still elevated, however  Workup is ongoing.  She is undergoing cardiac evaluation as well.   HISTORY: Migraines Onset:  Childhood Location:  Left sided Quality:  throbbing Initial intensity:  severe Aura:  no Prodrome:  Feels ill for 3 days prior Postdrome:  "hangover" effect for 1 day after Associated symptoms:  Left sided numbness of face, arm and leg with slight weakness of left arm and leg.  Nausea, photophobia, and phonophobia.  She has not had any new worse headache of her life, waking up from sleep Initial Duration:  Several hours to  several days.  Over the summer, she had status migrainosis lasting 13-14 weeks. Initial Frequency:  3 to 4 days a week Initial Frequency of abortive medication: 3 to 4 days a week Triggers/aggravating factors:  Sleep deprivation, shifting positions Relieving factors:  Laying down in dark and  quiet room Activity:  Aggravates.   Past NSAIDS:  Ibuprofen.  Cannot take NSAIDs due to GI bleed Past analgesics:  Excedrin Past abortive triptans/ergot:  Sumatriptan (increased headache) Past muscle relaxants:  no Past anti-emetic:  Reglan (contraindicated with cabergoline), Zofran '4mg'$ , Promethazine Past antihypertensive medications:  Propranolol '80mg'$  (hypotension) Past antidepressant medications:  Prozac; venlafaxine XR '150mg'$  daily Past anticonvulsant medications:  topiramate '100mg'$  BID (stopped as trying to get pregnant) Past CGRP inhibitors:  Roselyn Meier Past vitamins/Herbal/Supplements:  no Past antihistamines/decongestants:  no Other past therapies:  Reyvow Unable to afford Botox with her insurance plan.    Cluster Headache: Right orbital, stabbing, severe, associated with ptosis and conjunctival injection, lasting several hours and occurring 1 to 2 times a month.   Family history of headache:  mom   MRI of brain with and without contrast from 05/25/16 was personally reviewed and revealed 6 x 9 mm hypoenhancing pituitary microadenoma without compression of the optic chiasm. MRI of pituitary with and without contrast from 08/31/2019 showed regression of the microadenoma since 2018.   Past Medical History: Past Medical History:  Diagnosis Date   Anxiety    Asthma    Depression    GERD (gastroesophageal reflux disease)    History of chicken pox    History of migraine headaches    Pituitary adenoma ([place])    Seizures ([place])     Medications: Outpatient Encounter Medications as of 01/14/2022  Medication Sig Note   acetaminophen (TYLENOL) 500 MG tablet Take 500 mg by mouth as needed.    cabergoline (DOSTINEX) 0.5 MG tablet Take 1 tablet (0.5 mg total) by mouth as directed. 1 mg twice a week and 0.5 mg once a week 07/20/2021: Take tab Monday, 2tabs on Wednesday and 1 tab Friday   escitalopram (LEXAPRO) 10 MG tablet Take 1 tablet (10 mg total) by mouth daily.    famotidine (PEPCID) 20 MG  tablet Take 20 mg by mouth 2 (two) times daily.    famotidine (PEPCID) 20 MG tablet Take 1 tablet (20 mg total) by mouth 2 (two) times daily.    fenofibrate (TRICOR) 145 MG tablet Take 1 tablet (145 mg total) by mouth daily.    Galcanezumab-gnlm (EMGALITY) 120 MG/ML SOAJ Inject 120 mg into the skin every 28 (twenty-eight) days.    hydrOXYzine (ATARAX/VISTARIL) 25 MG tablet TAKE 1-3 TABLETS (25-75 MG TOTAL) BY MOUTH 3 (THREE) TIMES DAILY AS NEEDED FOR ANXIETY.    metFORMIN (GLUCOPHAGE) 500 MG tablet Take 1 tablet (500 mg total) by mouth 2 (two) times daily with a meal.    ondansetron (ZOFRAN) 8 MG tablet Take 1 tablet (8 mg total) by mouth as needed for nausea or vomiting.    pantoprazole (PROTONIX) 40 MG tablet Take 1 tablet (40 mg total) by mouth 2 (two) times daily.    predniSONE (DELTASONE) 20 MG tablet 2 tabs po daily x 4 days    QUEtiapine (SEROQUEL) 300 MG tablet Take 1 tablet (300 mg total) by mouth at bedtime.    Riboflavin 400 MG CAPS Take 400 mg by mouth daily.    Rimegepant Sulfate (NURTEC) 75 MG TBDP Take 75 mg by mouth as needed (Daily as needed for a Migraine). Maximum 1 tablet  in 24 hours.  Quantity 8.    rosuvastatin (CRESTOR) 20 MG tablet Take 1 tablet (20 mg total) by mouth daily.    tiZANidine (ZANAFLEX) 4 MG tablet Take 1 tablet (4 mg total) by mouth every 6 (six) hours as needed for muscle spasms.    traMADol (ULTRAM) 50 MG tablet Take 1 tablet (50 mg total) by mouth every 6 (six) hours as needed.    Vitamin D, Ergocalciferol, (DRISDOL) 1.25 MG (50000 UNIT) CAPS capsule Take 1 capsule (50,000 Units total) by mouth every 7 (seven) days.    No facility-administered encounter medications on file as of 01/14/2022.    Allergies: Allergies  Allergen Reactions   Aspirin    Beeswax    Chlorhexidine    Ibuprofen    Iodine    Lunesta [Eszopiclone] Other (See Comments)    GI issues   Nsaids Nausea And Vomiting   Shellfish Allergy     Tingle in mouth/throat   Sulfa  Antibiotics    Triptans Other (See Comments)    Makes pain worse   Tape Rash    Blisters     Family History: Family History  Problem Relation Age of Onset   Diabetes Mother    Migraines Mother    Hyperlipidemia Mother    Bipolar disorder Mother    Irritable bowel syndrome Mother    Diabetes Father    Colon cancer Father    Cancer Father        colon   Hyperlipidemia Father    Hypertension Father    Bipolar disorder Brother    Migraines Sister    Other Neg Hx        pituitary disorder   Esophageal cancer Neg Hx     Observations/Objective:   No acute distress.  Alert and oriented.  Speech fluent and not dysarthric.  Language intact.  Eyes orthophoric on primary gaze.  Face symmetric.   Follow Up Instructions:    -I discussed the assessment and treatment plan with the patient. The patient was provided an opportunity to ask questions and all were answered. The patient agreed with the plan and demonstrated an understanding of the instructions.   The patient was advised to call back or seek an in-person evaluation if the symptoms worsen or if the condition fails to improve as anticipated.   Dudley Major, DO  CC: Riki Sheer, DO

## 2022-01-13 NOTE — Telephone Encounter (Signed)
Patient advised of Dr.Jaffe note below.

## 2022-01-14 ENCOUNTER — Telehealth (INDEPENDENT_AMBULATORY_CARE_PROVIDER_SITE_OTHER): Admitting: Neurology

## 2022-01-14 ENCOUNTER — Encounter: Payer: Self-pay | Admitting: Neurology

## 2022-01-14 VITALS — Ht 66.0 in | Wt 265.0 lb

## 2022-01-14 DIAGNOSIS — G43419 Hemiplegic migraine, intractable, without status migrainosus: Secondary | ICD-10-CM

## 2022-01-14 DIAGNOSIS — G44019 Episodic cluster headache, not intractable: Secondary | ICD-10-CM

## 2022-01-14 DIAGNOSIS — D352 Benign neoplasm of pituitary gland: Secondary | ICD-10-CM

## 2022-01-14 MED ORDER — TRAMADOL HCL 50 MG PO TABS: 50.0000 mg | ORAL_TABLET | Freq: Four times a day (QID) | ORAL | 2 refills | Status: DC | PRN

## 2022-01-20 ENCOUNTER — Encounter: Payer: Self-pay | Admitting: Internal Medicine

## 2022-01-20 ENCOUNTER — Other Ambulatory Visit: Payer: Self-pay | Admitting: Family Medicine

## 2022-01-20 ENCOUNTER — Other Ambulatory Visit: Payer: Self-pay | Admitting: Neurology

## 2022-01-20 DIAGNOSIS — E221 Hyperprolactinemia: Secondary | ICD-10-CM

## 2022-01-21 MED ORDER — CABERGOLINE 0.5 MG PO TABS: 0.5000 mg | ORAL_TABLET | ORAL | 0 refills | Status: DC

## 2022-01-21 NOTE — Telephone Encounter (Signed)
Rx sent to pharmacy   

## 2022-03-04 ENCOUNTER — Other Ambulatory Visit

## 2022-04-25 ENCOUNTER — Ambulatory Visit: Admitting: Family Medicine

## 2022-04-26 ENCOUNTER — Encounter (HOSPITAL_BASED_OUTPATIENT_CLINIC_OR_DEPARTMENT_OTHER): Payer: Self-pay | Admitting: Emergency Medicine

## 2022-04-26 ENCOUNTER — Emergency Department (HOSPITAL_BASED_OUTPATIENT_CLINIC_OR_DEPARTMENT_OTHER)
Admission: EM | Admit: 2022-04-26 | Discharge: 2022-04-27 | Disposition: A | Discharge status: Home / Self Care | Attending: Emergency Medicine | Admitting: Emergency Medicine

## 2022-04-26 ENCOUNTER — Other Ambulatory Visit: Payer: Self-pay

## 2022-04-26 DIAGNOSIS — R519 Headache, unspecified: Secondary | ICD-10-CM | POA: Diagnosis present

## 2022-04-26 DIAGNOSIS — G43909 Migraine, unspecified, not intractable, without status migrainosus: Secondary | ICD-10-CM | POA: Insufficient documentation

## 2022-04-26 MED ORDER — DEXAMETHASONE SODIUM PHOSPHATE 4 MG/ML IJ SOLN
4.0000 mg | Freq: Once | INTRAMUSCULAR | Status: AC
  Administered 2022-04-26: 4 mg via INTRAVENOUS
  Filled 2022-04-26: qty 1

## 2022-04-26 MED ORDER — LACTATED RINGERS IV BOLUS
1000.0000 mL | Freq: Once | INTRAVENOUS | Status: AC
  Administered 2022-04-26: 1000 mL via INTRAVENOUS

## 2022-04-26 MED ORDER — METOCLOPRAMIDE HCL 5 MG/ML IJ SOLN
10.0000 mg | Freq: Once | INTRAMUSCULAR | Status: AC
  Administered 2022-04-26: 10 mg via INTRAVENOUS
  Filled 2022-04-26: qty 2

## 2022-04-26 MED ORDER — ACETAMINOPHEN 500 MG PO TABS
1000.0000 mg | ORAL_TABLET | Freq: Once | ORAL | Status: AC
  Administered 2022-04-26: 1000 mg via ORAL
  Filled 2022-04-26: qty 2

## 2022-04-26 MED ORDER — HYDROMORPHONE HCL 1 MG/ML IJ SOLN
1.0000 mg | Freq: Once | INTRAMUSCULAR | Status: AC
  Administered 2022-04-26: 1 mg via INTRAVENOUS
  Filled 2022-04-26: qty 1

## 2022-04-26 MED ORDER — DIPHENHYDRAMINE HCL 50 MG/ML IJ SOLN
25.0000 mg | Freq: Once | INTRAMUSCULAR | Status: AC
  Administered 2022-04-26: 25 mg via INTRAVENOUS
  Filled 2022-04-26: qty 1

## 2022-04-26 NOTE — Discharge Instructions (Signed)
You were seen in the emergency department for your headache.  This was consistent with your migraine headaches and we are able to improve your symptoms with IV headache medication.  You should follow-up with your primary doctor and your neurologist to have your symptoms rechecked and to see if you need any changes to your medication regimen to prevent your migraines in the future.  You should return to the emergency department if you have significantly worsening headaches, repetitive vomiting, numbness or weakness in 1 side of the body compared to the other or if you have any other new or concerning symptoms.

## 2022-04-26 NOTE — ED Notes (Signed)
Introduced myself to patient. Rates her headache pain 8/10.

## 2022-04-26 NOTE — ED Provider Notes (Signed)
[name] Provider Note   CSN: [id number] Arrival date & time: 04/26/22  1851     History  Chief Complaint  Patient presents with   Migraine    Amber [name] is a 43 y.o. female.  Patient is a 42 year old female with a past medical history of migraines, pituitary adenoma, GERD presenting to the emergency department with a migraine headache.  The patient states that she woke up with a headache this morning.  She states that it is on the left side of her head with associated nausea.  She states this feels similar to her prior migraines in the past and she states that she does occasionally have to come to the ER for her migraines.  She states that she took her Nurtec and tramadol at home without significant improvement.  She denies any vision changes, numbness or weakness.  She reports photophobia but denies any phonophobia.  The history is provided by the patient.  Migraine       Home Medications Prior to Admission medications   Medication Sig Start Date End Date Taking? Authorizing Provider  acetaminophen (TYLENOL) 500 MG tablet Take 500 mg by mouth as needed.    [provider]  cabergoline (DOSTINEX) 0.5 MG tablet Take 1 tablet (0.5 mg total) by mouth as directed. 1 mg twice a week and 0.5 mg once a week 01/21/22   Shamleffer, Melanie Crazier, MD  escitalopram (LEXAPRO) 10 MG tablet Take 1 tablet (10 mg total) by mouth daily. 08/25/21   Shelda Pal, DO  famotidine (PEPCID) 20 MG tablet Take 20 mg by mouth 2 (two) times daily.    [provider]  famotidine (PEPCID) 20 MG tablet Take 1 tablet (20 mg total) by mouth 2 (two) times daily. 11/06/21   Palumbo, April, MD  fenofibrate (TRICOR) 145 MG tablet Take 1 tablet (145 mg total) by mouth daily. 08/25/21   Wendling, Crosby Oyster, DO  Galcanezumab-gnlm (EMGALITY) 120 MG/ML SOAJ Inject 120 mg into the skin every 28 (twenty-eight) days. 11/24/21   Tomi Likens, Adam R,  DO  hydrOXYzine (ATARAX/VISTARIL) 25 MG tablet TAKE 1-3 TABLETS (25-75 MG TOTAL) BY MOUTH 3 (THREE) TIMES DAILY AS NEEDED FOR ANXIETY. 01/22/18   Shelda Pal, DO  metFORMIN (GLUCOPHAGE) 500 MG tablet Take 1 tablet (500 mg total) by mouth 2 (two) times daily with a meal. 08/25/21   Wendling, Crosby Oyster, DO  ondansetron (ZOFRAN) 8 MG tablet Take 1 tablet (8 mg total) by mouth as needed for nausea or vomiting. 06/28/21   Tomi Likens, Adam R, DO  pantoprazole (PROTONIX) 40 MG tablet Take 1 tablet (40 mg total) by mouth 2 (two) times daily. 08/25/21   Shelda Pal, DO  predniSONE (DELTASONE) 20 MG tablet 2 tabs po daily x 4 days 11/06/21   Palumbo, April, MD  QUEtiapine (SEROQUEL) 300 MG tablet TAKE ONE TABLET BY MOUTH EVERY DAY AT BEDTIME 01/21/22   Wendling, Crosby Oyster, DO  Riboflavin 400 MG CAPS Take 400 mg by mouth daily.    [provider]  Rimegepant Sulfate (NURTEC) 75 MG TBDP Take 75 mg by mouth as needed (Daily as needed for a Migraine). Maximum 1 tablet in 24 hours.  Quantity 8. 11/25/21   Tomi Likens, Adam R, DO  rosuvastatin (CRESTOR) 20 MG tablet Take 1 tablet (20 mg total) by mouth daily. 08/25/21   Shelda Pal, DO  tiZANidine (ZANAFLEX) 4 MG tablet Take 1 tablet (4 mg total) by mouth every  6 (six) hours as needed for muscle spasms. 11/24/21   Wendling, Crosby Oyster, DO  traMADol (ULTRAM) 50 MG tablet Take 1 tablet (50 mg total) by mouth every 6 (six) hours as needed. 01/14/22   Pieter Partridge, DO  Vitamin D, Ergocalciferol, (DRISDOL) 1.25 MG (50000 UNIT) CAPS capsule Take 1 capsule (50,000 Units total) by mouth every 7 (seven) days. 11/25/21   Shelda Pal, DO      Allergies    Aspirin, Beeswax, Chlorhexidine, Ibuprofen, Iodine, Lunesta [eszopiclone], Nsaids, Shellfish allergy, Sulfa antibiotics, Triptans, and Tape    Review of Systems   Review of Systems  Physical Exam Updated Vital Signs BP 104/71   Pulse 84   Temp 98.9 F (37.2 C) (Oral)   Resp  17   Ht '5\' 6"'$  (1.676 m)   Wt 129.3 kg   SpO2 94%   BMI 46.00 kg/m  Physical Exam Vitals and nursing note reviewed.  Constitutional:      General: She is not in acute distress.    Appearance: Normal appearance. She is obese. She is not toxic-appearing.  HENT:     Head: Normocephalic and atraumatic.     Nose: Nose normal.     Mouth/Throat:     Mouth: Mucous membranes are moist.     Pharynx: Oropharynx is clear.  Eyes:     Extraocular Movements: Extraocular movements intact.     Conjunctiva/sclera: Conjunctivae normal.     Pupils: Pupils are equal, round, and reactive to light.  Cardiovascular:     Rate and Rhythm: Normal rate and regular rhythm.     Pulses: Normal pulses.     Heart sounds: Normal heart sounds.  Pulmonary:     Effort: Pulmonary effort is normal.     Breath sounds: Normal breath sounds.  Abdominal:     General: Abdomen is flat.     Palpations: Abdomen is soft.     Tenderness: There is no abdominal tenderness.  Musculoskeletal:        General: Normal range of motion.     Cervical back: Normal range of motion and neck supple.  Skin:    General: Skin is warm and dry.  Neurological:     General: No focal deficit present.     Mental Status: She is alert and oriented to person, place, and time.     Cranial Nerves: No cranial nerve deficit.     Sensory: No sensory deficit.     Motor: No weakness.  Psychiatric:        Mood and Affect: Mood normal.        Behavior: Behavior normal.     ED Results / Procedures / Treatments   Labs (all labs ordered are listed, but only abnormal results are displayed) Labs Reviewed - No data to display  EKG None  Radiology No results found.  Procedures Procedures    Medications Ordered in ED Medications  HYDROmorphone (DILAUDID) injection 1 mg (has no administration in time range)  metoCLOPramide (REGLAN) injection 10 mg (10 mg Intravenous Given 04/26/22 2236)  acetaminophen (TYLENOL) tablet 1,000 mg (1,000 mg Oral  Given 04/26/22 2235)  diphenhydrAMINE (BENADRYL) injection 25 mg (25 mg Intravenous Given 04/26/22 2236)  lactated ringers bolus 1,000 mL (1,000 mLs Intravenous New Bag/Given 04/26/22 2231)  dexamethasone (DECADRON) injection 4 mg (4 mg Intravenous Given 04/26/22 2236)  HYDROmorphone (DILAUDID) injection 1 mg (1 mg Intravenous Given 04/26/22 2236)    ED Course/ Medical Decision Making/ A&P Clinical Course as of 04/26/22  [place] Apr 26, 2022  2320 Patient reports improvement of her headache though not completely resolved.  She is requesting 1 additional dose of Dilaudid.  She is approximately 500 cc of fluid remaining. [VK]    Clinical Course User Index [VK] Kemper Durie, DO                             Medical Decision Making This patient presents to the ED with chief complaint(s) of migraine headache with pertinent past medical history of migraines, pituitary adenoma, GERD which further complicates the presenting complaint. The complaint involves an extensive differential diagnosis and also carries with it a high risk of complications and morbidity.    The differential diagnosis includes migraine headache, no focal neuro deficits and headache similar to prior making ICH or mass effect unlikely    Additional history obtained: Additional history obtained from N/A Records reviewed neurology records  ED Course and Reassessment: On patient's arrival she is awake alert well-appearing in no acute distress with no focal neurologic deficits.  Patient reports her symptoms are similar to prior migraine headaches in the past.  Patient states she normally receives Dilaudid, Decadron, Benadryl and Reglan as well as IV fluids.  She states that she has an allergy to NSAIDs.  Patient will be given migraine cocktail and will be reassessed.  Independent labs interpretation:  N/A  Independent visualization of imaging: N/A  Consultation: - Consulted or discussed management/test interpretation w/  external professional: N/A  Consideration for admission or further workup: Patient has no emergent conditions requiring admission or further work-up at this time and is stable for discharge home with primary care follow-up  Social Determinants of health: N/A    Risk OTC drugs. Prescription drug management.          Final Clinical Impression(s) / ED Diagnoses Final diagnoses:  Migraine without status migrainosus, not intractable, unspecified migraine type    Rx / DC Orders ED Discharge Orders     None         Kemper Durie, DO 04/26/22 2342

## 2022-04-26 NOTE — ED Triage Notes (Signed)
Pt c/o migraine since 0100 today. States that she took a tramadol at 0100 and a Nurtec ODT at 1200 without relief.

## 2022-04-28 ENCOUNTER — Telehealth: Payer: Self-pay

## 2022-04-28 NOTE — Telephone Encounter (Signed)
Transition Care Management Unsuccessful Follow-up Telephone Call  Date of discharge and from where:  [facility] ER, 04/27/2022  Attempts:  1st Attempt  Reason for unsuccessful TCM follow-up call:  Left voice message

## 2022-04-29 NOTE — Telephone Encounter (Signed)
Transition Care Management Unsuccessful Follow-up Telephone Call  Date of discharge and from where:  [facility] ER 04/27/2022  Attempts:  2nd Attempt  Reason for unsuccessful TCM follow-up call:  Left voice message

## 2022-05-02 NOTE — Telephone Encounter (Signed)
Transition Care Management Unsuccessful Follow-up Telephone Call  Date of discharge and from where:  [facility] ER 04/27/2022  Attempts:  3rd Attempt  Reason for unsuccessful TCM follow-up call:  Left voice message

## 2022-05-25 NOTE — Progress Notes (Deleted)
Name: Amber [name]  MRN/ DOB: [id number], December 30, 1980    Age/ Sex: 42 y.o., female     PCP: Amber Pal, DO   Reason for Endocrinology Evaluation: Hyperprolactinemia      Initial Endocrinology Clinic Visit: 03/02/2016    PATIENT IDENTIFIER: Amber [name] is a 42 y.o., female with a past medical history of Prolactinoma, GERD, Asthma and migraine headaches . She has followed with [facility] since 03/02/2016 for consultative assistance with management of her hyperprolactinemia .   HISTORICAL SUMMARY: The patient was first diagnosed with hyperprolactinemia in 1996 during evaluation for primary amenorrhea ( highest reading 198 ). She is S/P resection of prolactinoma in 2009 [place], [place] due to size 3 cm, per pt she had a sphenoidal sinus penetration. Per pt prolactin levels normalized and menses resumed.  Pt was noted with elevated prolactin levels again 2015 and was started on Cabergoline, imaging have been reported at " no tumor"     Cabergoline was started 2019 Bromocriptine was added to cabergoline 05/2019 by another endocrinologist  On her initial visit with me in 08/2019, we stopped the Bromocriptine and increased cabergoline. Since the dose exceeded 2 mg/ week , we proceed with an echocardiogram , which showed normal valve structure but there was a question about a retro aortic coronary artery. CT angio was normal.     Repeat pituitary MRI in 08/2019 showed regression of pituitary gland     NO FH of pituitary disease  SUBJECTIVE:    Today (05/25/2022):  Amber [name] is here for a follow up on hyperprolactinemia , Prediabetes and pituitary adenoma.   Has regular menstruation , LMP 12/4th, 2022- regular  Denies nipple discharge  No vision changes   She continues with migraine headaches , follows with Dr. Loretta Plume , uses Emgality  Had an ED visit 03/06/2021 for migraine headaches   Denies nausea, vomiting or diarrhea   She is on  Cabergoline 0.5 mg:  Two tabs on Mondays, 1 tablet on wednesday and 2 tabs on Friday  Metformin 500 mg Twice daily    HISTORY:  Past Medical History:  Past Medical History:  Diagnosis Date   Anxiety    Asthma    Depression    GERD (gastroesophageal reflux disease)    History of chicken pox    History of migraine headaches    Pituitary adenoma ([place])    Seizures ([place])    Past Surgical History:  Past Surgical History:  Procedure Laterality Date   APPENDECTOMY     CHOLECYSTECTOMY     Pituiary Tumor     Tumor Removal Pituitary   Social History:  reports that she quit smoking about 6 years ago. Her smoking use included cigarettes. She has a 15.00 pack-year smoking history. She has never used smokeless tobacco. She reports that she does not drink alcohol and does not use drugs. Family History:  Family History  Problem Relation Age of Onset   Diabetes Mother    Migraines Mother    Hyperlipidemia Mother    Bipolar disorder Mother    Irritable bowel syndrome Mother    Diabetes Father    Colon cancer Father    Cancer Father        colon   Hyperlipidemia Father    Hypertension Father    Bipolar disorder Brother    Migraines Sister    Other Neg Hx        pituitary disorder   Esophageal cancer Neg Hx  HOME MEDICATIONS: Allergies as of 05/27/2022       Reactions   Aspirin    Beeswax    Chlorhexidine    Ibuprofen    Iodine    Lunesta [eszopiclone] Other (See Comments)   GI issues   Nsaids Nausea And Vomiting   Shellfish Allergy    Tingle in mouth/throat   Sulfa Antibiotics    Triptans Other (See Comments)   Makes pain worse   Tape Rash   Blisters        Medication List        Accurate as of May 25, 2022 10:24 AM. If you have any questions, ask your nurse or doctor.          acetaminophen 500 MG tablet Commonly known as: TYLENOL Take 500 mg by mouth as needed.   cabergoline 0.5 MG tablet Commonly known as: DOSTINEX Take 1 tablet (0.5 mg  total) by mouth as directed. 1 mg twice a week and 0.5 mg once a week   Emgality 120 MG/ML So[place] Generic drug: Galcanezumab-gnlm Inject 120 mg into the skin every 28 (twenty-eight) days.   escitalopram 10 MG tablet Commonly known as: LEXAPRO Take 1 tablet (10 mg total) by mouth daily.   famotidine 20 MG tablet Commonly known as: PEPCID Take 20 mg by mouth 2 (two) times daily.   famotidine 20 MG tablet Commonly known as: Pepcid Take 1 tablet (20 mg total) by mouth 2 (two) times daily.   fenofibrate 145 MG tablet Commonly known as: TRICOR Take 1 tablet (145 mg total) by mouth daily.   hydrOXYzine 25 MG tablet Commonly known as: ATARAX TAKE 1-3 TABLETS (25-75 MG TOTAL) BY MOUTH 3 (THREE) TIMES DAILY AS NEEDED FOR ANXIETY.   metFORMIN 500 MG tablet Commonly known as: GLUCOPHAGE Take 1 tablet (500 mg total) by mouth 2 (two) times daily with a meal.   Nurtec 75 MG Tbdp Generic drug: Rimegepant Sulfate Take 75 mg by mouth as needed (Daily as needed for a Migraine). Maximum 1 tablet in 24 hours.  Quantity 8.   ondansetron 8 MG tablet Commonly known as: Zofran Take 1 tablet (8 mg total) by mouth as needed for nausea or vomiting.   pantoprazole 40 MG tablet Commonly known as: PROTONIX Take 1 tablet (40 mg total) by mouth 2 (two) times daily.   predniSONE 20 MG tablet Commonly known as: DELTASONE 2 tabs po daily x 4 days   QUEtiapine 300 MG tablet Commonly known as: SEROQUEL TAKE ONE TABLET BY MOUTH EVERY DAY AT BEDTIME   Riboflavin 400 MG Caps Take 400 mg by mouth daily.   rosuvastatin 20 MG tablet Commonly known as: Crestor Take 1 tablet (20 mg total) by mouth daily.   tiZANidine 4 MG tablet Commonly known as: Zanaflex Take 1 tablet (4 mg total) by mouth every 6 (six) hours as needed for muscle spasms.   traMADol 50 MG tablet Commonly known as: ULTRAM Take 1 tablet (50 mg total) by mouth every 6 (six) hours as needed.   Vitamin D (Ergocalciferol) 1.25 MG (50000  UNIT) Caps capsule Commonly known as: DRISDOL Take 1 capsule (50,000 Units total) by mouth every 7 (seven) days.          OBJECTIVE:   PHYSICAL EXAM: VS: There were no vitals taken for this visit.   EXAM: General: Pt appears well and is in NAD  Neck: General: Supple without adenopathy. Thyroid: Thyroid size normal.  No goiter or nodules appreciated.  Lungs: Clear with  good BS bilat with no rales, rhonchi, or wheezes  Heart: Auscultation: RRR.  Abdomen: Normoactive bowel sounds, soft, nontender, without masses or organomegaly palpable  Extremities:  BL LE: No pretibial edema normal ROM and strength.  Mental Status: Judgment, insight: Intact Orientation: Oriented to time, place, and person Mood and affect: No depression, anxiety, or agitation      DATA REVIEWED:    Latest Reference Range & Units 01/11/22 19:20  Sodium 135 - 145 mmol/L 137  Potassium 3.5 - 5.1 mmol/L 3.8  Chloride 98 - 111 mmol/L 107  CO2 22 - 32 mmol/L 25  Glucose 70 - 99 mg/dL 112 (H)  BUN 6 - 20 mg/dL 10  Creatinine 0.44 - 1.00 mg/dL 1.10 (H)  Calcium 8.9 - 10.3 mg/dL 8.6 (L)  Anion gap 5 - 15  5  Alkaline Phosphatase 38 - 126 U/L 38  Albumin 3.5 - 5.0 g/dL 3.6  AST 15 - 41 U/L 23  ALT 0 - 44 U/L 21  Total Protein 6.5 - 8.1 g/dL 6.4 (L)  Total Bilirubin 0.3 - 1.2 mg/dL 0.3  GFR, Estimated >60 mL/min >60  (H): Data is abnormally high (L): Data is abnormally low   MRI 08/31/2019  Dedicated pituitary imaging. Gland size appears slightly smaller now compared to 2018 (series 16, image 9 today versus series 17, image 6 previously). No suprasellar mass or mass effect. Infundibulum and hypothalamus appear normal.   Regressed hypoenhancing area in the left inferior and lateral aspect of the gland. Today there is only subtle heterogeneous enhancement on that side (series 7, image 10) encompassing 3-4 mm. The cavernous sinuses remain normal. Normal clivus. No other abnormal enhancement.    IMPRESSION: 1. Regression of pituitary microadenoma since 2018, subtle residual heterogeneity in the left aspect of the gland encompassing 3-4 mm.   2. No new intracranial abnormality. Stable mild nonspecific cerebral white matter signal changes. ASSESSMENT / PLAN / RECOMMENDATIONS:   Hx of Pituitary Macroadenoma , S/P Transphenoidal surgery in 2009:  - Pr pt she had a transphenoidal surgery for a 3 cm pituitary adenoma in 2009 - Her last MRI was in 08/2019 indicating regression  - Labs have been normal to include TSH, ACTH , cortisol and IGF-1 (08/2019)    Hyperprolactinemia :  - Diagnosed in 1996 - Normalization of levels following transphenoidal pituitary resection in 2009 , restarted on cabergoline in 2015 and again in 2019.  - In March 2021, bromocriptine was added to her regimen by previouse endocrinologist, this was discontinued in 08/2019 and increased cabergoline.  - Pt will need echocardiogram in 09/2021  - Pt understands to stop Cabergoline with positive pregnancy test and to notify our office  -Prolactin is slightly elevated but she just had a recent ED visit for migraine headaches and she was given a cocktail of medication that included Dilaudid as well as taking tramadol, I am not going to make any changes to her cabergoline dose as narcotics can cause short-term elevation of prolactin, we will continue to monitor    Medications   Continue cabergoline 0.5 mg ,a total of  2.5 mg weekly (Two tabs on Mondays, 1 tab on wednesdays and 2 tabs on Fridays)   2. Pre-Diabetes :   -A1c stable at 5.8% -No changes  Continue Metformin 500 mg Twice daily     F/U in 6 months      Signed electronically by: Mack Guise, MD  [facility]  [address] [place]  [place], [place] [address] Phone: [phone] FAX: [phone]      CC: Amber [name], [address] [place]  [address] Phone: [phone]  Fax: [phone]   Return to Endocrinology clinic as below: Future Appointments  Date Time Provider [place]  05/27/2022  9:50 AM Cerra Eisenhower, Melanie Crazier, MD [id number] None

## 2022-05-27 ENCOUNTER — Ambulatory Visit: Admitting: Internal Medicine

## 2022-06-02 ENCOUNTER — Emergency Department (HOSPITAL_BASED_OUTPATIENT_CLINIC_OR_DEPARTMENT_OTHER)
Admission: EM | Admit: 2022-06-02 | Discharge: 2022-06-02 | Disposition: A | Discharge status: Home / Self Care | Attending: Emergency Medicine | Admitting: Emergency Medicine

## 2022-06-02 ENCOUNTER — Other Ambulatory Visit: Payer: Self-pay

## 2022-06-02 DIAGNOSIS — G43809 Other migraine, not intractable, without status migrainosus: Secondary | ICD-10-CM

## 2022-06-02 DIAGNOSIS — R000 Tachycardia, unspecified: Secondary | ICD-10-CM | POA: Insufficient documentation

## 2022-06-02 DIAGNOSIS — H53149 Visual discomfort, unspecified: Secondary | ICD-10-CM | POA: Insufficient documentation

## 2022-06-02 LAB — BASIC METABOLIC PANEL
Anion gap: 6 (ref 5–15)
BUN: 9 mg/dL (ref 6–20)
CO2: 24 mmol/L (ref 22–32)
Calcium: 8.5 mg/dL — ABNORMAL LOW (ref 8.9–10.3)
Chloride: 101 mmol/L (ref 98–111)
Creatinine, Ser: 0.98 mg/dL (ref 0.44–1.00)
GFR, Estimated: >60 mL/min (ref >60)
Glucose, Bld: 141 mg/dL — ABNORMAL HIGH (ref 70–99)
Potassium: 3.5 mmol/L (ref 3.5–5.1)
Sodium: 131 mmol/L — ABNORMAL LOW (ref 135–145)

## 2022-06-02 LAB — CBC WITH DIFFERENTIAL/PLATELET
Abs Immature Granulocytes: 0.05 10*3/uL (ref 0.00–0.07)
Basophils Absolute: 0 10*3/uL (ref 0.0–0.1)
Basophils Relative: 0 %
Eosinophils Absolute: 0.2 10*3/uL (ref 0.0–0.5)
Eosinophils Relative: 2 %
HCT: 34.5 % — ABNORMAL LOW (ref 36.0–46.0)
Hemoglobin: 11.1 g/dL — ABNORMAL LOW (ref 12.0–15.0)
Immature Granulocytes: 1 %
Lymphocytes Relative: 30 %
Lymphs Abs: 2.8 10*3/uL (ref 0.7–4.0)
MCH: 25.3 pg — ABNORMAL LOW (ref 26.0–34.0)
MCHC: 32.2 g/dL (ref 30.0–36.0)
MCV: 78.6 fL — ABNORMAL LOW (ref 80.0–100.0)
Monocytes Absolute: 0.9 10*3/uL (ref 0.1–1.0)
Monocytes Relative: 9 %
Neutro Abs: 5.3 10*3/uL (ref 1.7–7.7)
Neutrophils Relative %: 58 %
Platelets: 382 10*3/uL (ref 150–400)
RBC: 4.39 MIL/uL (ref 3.87–5.11)
RDW: 14.2 % (ref 11.5–15.5)
WBC: 9.2 10*3/uL (ref 4.0–10.5)
nRBC: 0 % (ref 0.0–0.2)

## 2022-06-02 MED ORDER — SODIUM CHLORIDE 0.9 % IV BOLUS
1000.0000 mL | Freq: Once | INTRAVENOUS | Status: AC
  Administered 2022-06-02: 1000 mL via INTRAVENOUS

## 2022-06-02 MED ORDER — METOCLOPRAMIDE HCL 5 MG/ML IJ SOLN
10.0000 mg | Freq: Once | INTRAMUSCULAR | Status: AC
  Administered 2022-06-02: 10 mg via INTRAVENOUS
  Filled 2022-06-02: qty 2

## 2022-06-02 MED ORDER — HYDROMORPHONE HCL 1 MG/ML IJ SOLN
1.0000 mg | Freq: Once | INTRAMUSCULAR | Status: AC
  Administered 2022-06-02: 1 mg via INTRAVENOUS
  Filled 2022-06-02: qty 1

## 2022-06-02 MED ORDER — DIPHENHYDRAMINE HCL 50 MG/ML IJ SOLN
50.0000 mg | Freq: Once | INTRAMUSCULAR | Status: AC
  Administered 2022-06-02: 50 mg via INTRAVENOUS
  Filled 2022-06-02: qty 1

## 2022-06-02 MED ORDER — DEXAMETHASONE SODIUM PHOSPHATE 4 MG/ML IJ SOLN
4.0000 mg | Freq: Once | INTRAMUSCULAR | Status: AC
  Administered 2022-06-02: 4 mg via INTRAVENOUS
  Filled 2022-06-02: qty 1

## 2022-06-02 NOTE — Discharge Instructions (Addendum)
Continue taking your current meds for migraine   Follow-up with your neurologist  Return to ER if you have worse headache, vomiting, fever

## 2022-06-02 NOTE — ED Provider Notes (Signed)
[facility] Provider Note   CSN: [id number] Arrival date & time: 06/02/22  2051     History  Chief Complaint  Patient presents with   Migraine    Eilidh Dipierro is a 42 y.o. female history of migraines, diabetes, here presenting with headaches.  Patient woke up around 4 AM with headaches.  Patient states that she usually takes her Nucynta and tramadol and that helps with the headaches.  She states that this time it did not help.  She states that she is vomiting unable to keep anything down.  She states that whenever this happens, she usually needs IV pain medicine and Reglan and Benadryl and steroids.  Patient follows up with Dr. Tomi Likens from neurology.  She had multiple neuroimaging previously that was unremarkable.  Denies any neck pain or fever  The history is provided by the patient.       Home Medications Prior to Admission medications   Medication Sig Start Date End Date Taking? Authorizing Provider  acetaminophen (TYLENOL) 500 MG tablet Take 500 mg by mouth as needed.    [provider]  cabergoline (DOSTINEX) 0.5 MG tablet Take 1 tablet (0.5 mg total) by mouth as directed. 1 mg twice a week and 0.5 mg once a week 01/21/22   Shamleffer, Melanie Crazier, MD  escitalopram (LEXAPRO) 10 MG tablet Take 1 tablet (10 mg total) by mouth daily. 08/25/21   Shelda Pal, DO  famotidine (PEPCID) 20 MG tablet Take 20 mg by mouth 2 (two) times daily.    [provider]  famotidine (PEPCID) 20 MG tablet Take 1 tablet (20 mg total) by mouth 2 (two) times daily. 11/06/21   Palumbo, April, MD  fenofibrate (TRICOR) 145 MG tablet Take 1 tablet (145 mg total) by mouth daily. 08/25/21   Wendling, Crosby Oyster, DO  Galcanezumab-gnlm (EMGALITY) 120 MG/ML SOAJ Inject 120 mg into the skin every 28 (twenty-eight) days. 11/24/21   Tomi Likens, Adam R, DO  hydrOXYzine (ATARAX/VISTARIL) 25 MG tablet TAKE 1-3 TABLETS (25-75 MG TOTAL) BY MOUTH 3  (THREE) TIMES DAILY AS NEEDED FOR ANXIETY. 01/22/18   Shelda Pal, DO  metFORMIN (GLUCOPHAGE) 500 MG tablet Take 1 tablet (500 mg total) by mouth 2 (two) times daily with a meal. 08/25/21   Wendling, Crosby Oyster, DO  ondansetron (ZOFRAN) 8 MG tablet Take 1 tablet (8 mg total) by mouth as needed for nausea or vomiting. 06/28/21   Tomi Likens, Adam R, DO  pantoprazole (PROTONIX) 40 MG tablet Take 1 tablet (40 mg total) by mouth 2 (two) times daily. 08/25/21   Shelda Pal, DO  predniSONE (DELTASONE) 20 MG tablet 2 tabs po daily x 4 days 11/06/21   Palumbo, April, MD  QUEtiapine (SEROQUEL) 300 MG tablet TAKE ONE TABLET BY MOUTH EVERY DAY AT BEDTIME 01/21/22   Wendling, Crosby Oyster, DO  Riboflavin 400 MG CAPS Take 400 mg by mouth daily.    [provider]  Rimegepant Sulfate (NURTEC) 75 MG TBDP Take 75 mg by mouth as needed (Daily as needed for a Migraine). Maximum 1 tablet in 24 hours.  Quantity 8. 11/25/21   Tomi Likens, Adam R, DO  rosuvastatin (CRESTOR) 20 MG tablet Take 1 tablet (20 mg total) by mouth daily. 08/25/21   Shelda Pal, DO  tiZANidine (ZANAFLEX) 4 MG tablet Take 1 tablet (4 mg total) by mouth every 6 (six) hours as needed for muscle spasms. 11/24/21   Shelda Pal, DO  traMADol (  ULTRAM) 50 MG tablet Take 1 tablet (50 mg total) by mouth every 6 (six) hours as needed. 01/14/22   Pieter Partridge, DO  Vitamin D, Ergocalciferol, (DRISDOL) 1.25 MG (50000 UNIT) CAPS capsule Take 1 capsule (50,000 Units total) by mouth every 7 (seven) days. 11/25/21   Shelda Pal, DO      Allergies    Aspirin, Beeswax, Chlorhexidine, Ibuprofen, Iodine, Lunesta [eszopiclone], Nsaids, Shellfish allergy, Sulfa antibiotics, Triptans, and Tape    Review of Systems   Review of Systems  Neurological:  Positive for headaches.  All other systems reviewed and are negative.   Physical Exam Updated Vital Signs BP 106/68 (BP Location: Right Arm)   Pulse (!) 139   Temp 98.9  F (37.2 C) (Oral)   Resp 16   Ht '5\' 6"'$  (1.676 m)   Wt 136.1 kg   LMP 05/17/2022 (Exact Date)   SpO2 97%   BMI 48.42 kg/m  Physical Exam Vitals and nursing note reviewed.  Constitutional:      Comments: Uncomfortable and photophobic  HENT:     Head: Normocephalic.     Nose: Nose normal.     Mouth/Throat:     Mouth: Mucous membranes are moist.  Eyes:     Extraocular Movements: Extraocular movements intact.     Pupils: Pupils are equal, round, and reactive to light.  Cardiovascular:     Rate and Rhythm: Normal rate and regular rhythm.     Pulses: Normal pulses.     Heart sounds: Normal heart sounds.  Pulmonary:     Effort: Pulmonary effort is normal.     Breath sounds: Normal breath sounds.  Abdominal:     General: Abdomen is flat.     Palpations: Abdomen is soft.  Musculoskeletal:        General: Normal range of motion.     Cervical back: Normal range of motion and neck supple.  Skin:    General: Skin is warm.     Capillary Refill: Capillary refill takes less than 2 seconds.  Neurological:     Comments: Cranial nerves II to XII intact.  Patient has normal strength and sensation bilateral arms and legs.  Normal gait.  Psychiatric:        Mood and Affect: Mood normal.        Behavior: Behavior normal.     ED Results / Procedures / Treatments   Labs (all labs ordered are listed, but only abnormal results are displayed) Labs Reviewed  CBC WITH DIFFERENTIAL/PLATELET  BASIC METABOLIC PANEL  PREGNANCY, URINE    EKG None  Radiology No results found.  Procedures Procedures    Medications Ordered in ED Medications  sodium chloride 0.9 % bolus 1,000 mL (has no administration in time range)  metoCLOPramide (REGLAN) injection 10 mg (has no administration in time range)  diphenhydrAMINE (BENADRYL) injection 50 mg (has no administration in time range)  HYDROmorphone (DILAUDID) injection 1 mg (has no administration in time range)  dexamethasone (DECADRON)  injection 4 mg (has no administration in time range)    ED Course/ Medical Decision Making/ A&P                             Medical Decision Making Allix Monett is a 42 y.o. female here presenting with headaches.  This is typical of her migraines.  She states that she usually needs IV pain medicine and Reglan and Benadryl and steroids.  No neurodeficits  and no fever and have low suspicion for intracranial bleeding or meningitis. Will give migraine cocktail and check labs and reassess.   10:49 PM Patient was tachycardic initially that improved with IV fluids and medicines.  CBC and BMP unremarkable.  Patient states that the headache has improved.  She has neurology follow-up.  Stable for discharge  Problems Addressed: Other migraine without status migrainosus, not intractable: acute illness or injury  Amount and/or Complexity of Data Reviewed Labs: ordered. Decision-making details documented in ED Course. ECG/medicine tests: ordered and independent interpretation performed. Decision-making details documented in ED Course.  Risk Prescription drug management.    Final Clinical Impression(s) / ED Diagnoses Final diagnoses:  None    Rx / DC Orders ED Discharge Orders     None         Drenda Freeze, MD 06/02/22 2249

## 2022-06-02 NOTE — ED Triage Notes (Signed)
Pt to ED by POV from home c/o migraine headache which began at 0400 today. Pt has a hx of migraines. States she taken tramadol and Nurtec with no relief. Arrives A+O, VSS, NADN.

## 2022-06-03 ENCOUNTER — Telehealth: Payer: Self-pay

## 2022-06-03 NOTE — Telephone Encounter (Signed)
Per Dr.Jaffe, Last seen in October.  Recommended follow up 4-5 months.  She does not have a follow up scheduled.  Would like to contact patient for follow up       LMOVM for patient please give the office a call to schedule a follow up visit from ED visit 06/02/22.

## 2022-06-20 ENCOUNTER — Encounter: Payer: Self-pay | Admitting: Neurology

## 2022-06-20 ENCOUNTER — Other Ambulatory Visit: Payer: Self-pay | Admitting: Internal Medicine

## 2022-06-20 ENCOUNTER — Other Ambulatory Visit: Payer: Self-pay

## 2022-06-20 DIAGNOSIS — E221 Hyperprolactinemia: Secondary | ICD-10-CM

## 2022-06-20 MED ORDER — EMGALITY 120 MG/ML ~~LOC~~ SOAJ: 120.0000 mg | SUBCUTANEOUS | 5 refills | Status: DC

## 2022-06-22 ENCOUNTER — Encounter: Payer: Self-pay | Admitting: Neurology

## 2022-06-22 ENCOUNTER — Telehealth (INDEPENDENT_AMBULATORY_CARE_PROVIDER_SITE_OTHER): Admitting: Neurology

## 2022-06-22 DIAGNOSIS — G43409 Hemiplegic migraine, not intractable, without status migrainosus: Secondary | ICD-10-CM

## 2022-06-22 DIAGNOSIS — G44019 Episodic cluster headache, not intractable: Secondary | ICD-10-CM

## 2022-06-22 MED ORDER — NURTEC 75 MG PO TBDP: 75.0000 mg | ORAL_TABLET | ORAL | 11 refills | Status: DC | PRN

## 2022-06-22 MED ORDER — TOPIRAMATE 50 MG PO TABS: 50.0000 mg | ORAL_TABLET | Freq: Every day | ORAL | 5 refills | Status: DC

## 2022-06-22 MED ORDER — EMGALITY 120 MG/ML ~~LOC~~ SOAJ: 120.0000 mg | SUBCUTANEOUS | 11 refills | Status: DC

## 2022-06-22 MED ORDER — TRAMADOL HCL 50 MG PO TABS: 50.0000 mg | ORAL_TABLET | Freq: Four times a day (QID) | ORAL | 2 refills | Status: DC | PRN

## 2022-06-22 NOTE — Patient Instructions (Signed)
Start topiramate 50mg  at bedtime - take 1/2 tablet at bedtime for one week, then increase to 1 tablet at bedtime.  No improvement in 6 weeks, contact me  Continue Emgality, Nurtec, tramadol

## 2022-06-22 NOTE — Progress Notes (Signed)
Virtual Visit via Video Note  Consent was obtained for video visit:  Yes.   Answered questions that patient had about telehealth interaction:  Yes.   I discussed the limitations, risks, security and privacy concerns of performing an evaluation and management service by telemedicine. I also discussed with the patient that there may be a patient responsible charge related to this service. The patient expressed understanding and agreed to proceed.  Pt location: Home Physician Location: office Name of referring provider:  Shelda Pal* I connected with Amber [name] at patients initiation/request on 06/22/2022 at 10:30 AM EDT by video enabled telemedicine application and verified that I am speaking with the correct person using two identifiers. Pt MRN:  [id number] Pt DOB:  Mar 14, 1981 Video Participants:  Amber [name]   Assessment/Plan:   1.  Hemiplegic migraine, intractable 2.  Cluster headache, infrequent 3.  Prolactinoma    1.  Migraine prevention:  Emgality.  To further reduce frequency, we will restart topiramate - take 25mg  at bedtime for one week, then 50mg  at bedtime.  We can increase dose in 6 weeks if needed 3.  Migraine Rescue:  Nurtec first line, tramadol second line; Zofran for nausea.  For cluster headaches, tramadol. 4.  Limit use of pain relievers to no more than 2 days out of week to prevent risk of rebound or medication-overuse headache. 5.  Keep headache diary 6.  Follow up in 6 months.  Subjective:  Amber [name] is a 42 year old female with prolactinoma and anxiety with depression who follows up for migraine.   UPDATE: Migraines:  In March, she had 8 migraine days.  Usually lasts 2-3 hours with Nurtec.  Nurtec not always effective.  Since last visit, had to go to ED twice.   Cluster headache:  2 cluster headaches in March.    Rescue protocol:  Nurtec, tramadol, Zofran 8mg  Frequency of abortive: 2 to 3 days a week Rescue protocol:   Zofran, Nurtec (tramadol second line) Current NSAIDS:  no Current analgesics:  tramadol Current triptans:  no Current ergot:  Dostinex (for hyperprolactinemia) Current anti-emetic:  Zofran 8mg  Current muscle relaxants:  tizanidine 4mg  PRN muscle spams Current anti-anxiolytic:  hydroxyzine Current sleep aide:  quetiapine 50mg  Current Antihypertensive medications:  no Current Antidepressant medications:  Lexapro 10mg  Current Anticonvulsant medications:  none Current CGRP inhibitor:  Emgality, Nurtec (rescue) Current Vitamins/Herbal/Supplements:  magnesium oxide 400mg , riboflavin 400mg  Current Antihistamines/Decongestants:  Benadryl Other therapy:  no   Caffeine:  1 cup coffee rarely Alcohol:  no Smoker:  no Diet:  60 oz water daily.  Little fast food.  No soda.  Eats chicken and fish.  Not skipping meals.   Exercise:  no Depression/anxiety:  yes Other pain: none Sleep hygiene:  Improved with quetiapine   Prolactin level still elevated, however  Workup is ongoing.  She is undergoing cardiac evaluation as well.   HISTORY: Migraines Onset:  Childhood Location:  Left sided Quality:  throbbing Initial intensity:  severe Aura:  no Prodrome:  Feels ill for 3 days prior Postdrome:  "hangover" effect for 1 day after Associated symptoms:  Left sided numbness of face, arm and leg with slight weakness of left arm and leg.  Nausea, photophobia, and phonophobia.  She has not had any new worse headache of her life, waking up from sleep Initial Duration:  Several hours to several days.  Over the summer, she had status migrainosis lasting 13-14 weeks. Initial Frequency:  3 to 4 days a week Initial Frequency  of abortive medication: 3 to 4 days a week Triggers/aggravating factors:  Sleep deprivation, shifting positions Relieving factors:  Laying down in dark and quiet room Activity:  Aggravates.   Past NSAIDS:  Ibuprofen.  Cannot take NSAIDs due to GI bleed Past analgesics:  Excedrin Past  abortive triptans/ergot:  Sumatriptan (increased headache and caused joint pain).  CONTRAINDICATED (Hemiplegic migraine) Past muscle relaxants:  no Past anti-emetic:  Reglan (contraindicated with cabergoline), Zofran 4mg , Promethazine Past antihypertensive medications:  Propranolol 80mg  (hypotension) Past antidepressant medications:  Prozac; venlafaxine XR 150mg  daily Past anticonvulsant medications:  topiramate 100mg  BID (stopped as trying to get pregnant) Past CGRP inhibitors:  Roselyn Meier Past vitamins/Herbal/Supplements:  no Past antihistamines/decongestants:  no Other past therapies:  Reyvow Unable to afford Botox with her insurance plan.    Cluster Headache: Right orbital, stabbing, severe, associated with ptosis and conjunctival injection, lasting several hours and occurring 1 to 2 times a month.   Family history of headache:  mom   MRI of brain with and without contrast from 05/25/16 was personally reviewed and revealed 6 x 9 mm hypoenhancing pituitary microadenoma without compression of the optic chiasm. MRI of pituitary with and without contrast from 08/31/2019 showed regression of the microadenoma since 2018.   Past Medical History: Past Medical History:  Diagnosis Date   Anxiety    Asthma    Depression    GERD (gastroesophageal reflux disease)    History of chicken pox    History of migraine headaches    Pituitary adenoma ([place])    Seizures ([place])     Medications: Outpatient Encounter Medications as of 06/22/2022  Medication Sig   acetaminophen (TYLENOL) 500 MG tablet Take 500 mg by mouth as needed.   cabergoline (DOSTINEX) 0.5 MG tablet Take 1 tablet (0.5 mg total) by mouth as directed. 1 mg twice a week and 0.5 mg once a week   escitalopram (LEXAPRO) 10 MG tablet Take 1 tablet (10 mg total) by mouth daily.   famotidine (PEPCID) 20 MG tablet Take 20 mg by mouth 2 (two) times daily.   famotidine (PEPCID) 20 MG tablet Take 1 tablet (20 mg total) by mouth 2 (two) times daily.    fenofibrate (TRICOR) 145 MG tablet Take 1 tablet (145 mg total) by mouth daily.   Galcanezumab-gnlm (EMGALITY) 120 MG/ML SOAJ Inject 120 mg into the skin every 28 (twenty-eight) days.   hydrOXYzine (ATARAX/VISTARIL) 25 MG tablet TAKE 1-3 TABLETS (25-75 MG TOTAL) BY MOUTH 3 (THREE) TIMES DAILY AS NEEDED FOR ANXIETY.   metFORMIN (GLUCOPHAGE) 500 MG tablet Take 1 tablet (500 mg total) by mouth 2 (two) times daily with a meal.   ondansetron (ZOFRAN) 8 MG tablet Take 1 tablet (8 mg total) by mouth as needed for nausea or vomiting.   pantoprazole (PROTONIX) 40 MG tablet Take 1 tablet (40 mg total) by mouth 2 (two) times daily.   predniSONE (DELTASONE) 20 MG tablet 2 tabs po daily x 4 days   QUEtiapine (SEROQUEL) 300 MG tablet TAKE ONE TABLET BY MOUTH EVERY DAY AT BEDTIME   Riboflavin 400 MG CAPS Take 400 mg by mouth daily.   Rimegepant Sulfate (NURTEC) 75 MG TBDP Take 75 mg by mouth as needed (Daily as needed for a Migraine). Maximum 1 tablet in 24 hours.  Quantity 8.   rosuvastatin (CRESTOR) 20 MG tablet Take 1 tablet (20 mg total) by mouth daily.   tiZANidine (ZANAFLEX) 4 MG tablet Take 1 tablet (4 mg total) by mouth every 6 (six) hours as  needed for muscle spasms.   traMADol (ULTRAM) 50 MG tablet Take 1 tablet (50 mg total) by mouth every 6 (six) hours as needed.   Vitamin D, Ergocalciferol, (DRISDOL) 1.25 MG (50000 UNIT) CAPS capsule Take 1 capsule (50,000 Units total) by mouth every 7 (seven) days.   No facility-administered encounter medications on file as of 06/22/2022.    Allergies: Allergies  Allergen Reactions   Aspirin    Beeswax    Chlorhexidine    Ibuprofen    Iodine    Lunesta [Eszopiclone] Other (See Comments)    GI issues   Nsaids Nausea And Vomiting   Shellfish Allergy     Tingle in mouth/throat   Sulfa Antibiotics    Triptans Other (See Comments)    Makes pain worse   Tape Rash    Blisters     Family History: Family History  Problem Relation Age of Onset   Diabetes  Mother    Migraines Mother    Hyperlipidemia Mother    Bipolar disorder Mother    Irritable bowel syndrome Mother    Diabetes Father    Colon cancer Father    Cancer Father        colon   Hyperlipidemia Father    Hypertension Father    Bipolar disorder Brother    Migraines Sister    Other Neg Hx        pituitary disorder   Esophageal cancer Neg Hx     Observations/Objective:   No acute distress.  Alert and oriented.  Speech fluent and not dysarthric.  Language intact.  Eyes orthophoric on primary gaze.  Face symmetric.   Follow Up Instructions:    -I discussed the assessment and treatment plan with the patient. The patient was provided an opportunity to ask questions and all were answered. The patient agreed with the plan and demonstrated an understanding of the instructions.   The patient was advised to call back or seek an in-person evaluation if the symptoms worsen or if the condition fails to improve as anticipated.   Dudley Major, DO  CC: Riki Sheer, DO

## 2022-07-04 ENCOUNTER — Encounter: Payer: Self-pay | Admitting: *Deleted

## 2022-08-03 ENCOUNTER — Ambulatory Visit: Admitting: Family Medicine

## 2022-08-05 ENCOUNTER — Other Ambulatory Visit: Payer: Self-pay | Admitting: Internal Medicine

## 2022-08-05 DIAGNOSIS — E221 Hyperprolactinemia: Secondary | ICD-10-CM

## 2022-08-12 ENCOUNTER — Other Ambulatory Visit: Payer: Self-pay | Admitting: Internal Medicine

## 2022-08-12 DIAGNOSIS — E221 Hyperprolactinemia: Secondary | ICD-10-CM

## 2022-08-16 MED ORDER — CABERGOLINE 0.5 MG PO TABS: ORAL_TABLET | ORAL | 0 refills | Status: DC

## 2022-08-22 ENCOUNTER — Ambulatory Visit: Admitting: Family Medicine

## 2022-08-23 ENCOUNTER — Other Ambulatory Visit: Payer: Self-pay | Admitting: Internal Medicine

## 2022-08-23 DIAGNOSIS — E221 Hyperprolactinemia: Secondary | ICD-10-CM

## 2022-08-26 ENCOUNTER — Encounter: Payer: Self-pay | Admitting: Family Medicine

## 2022-08-26 DIAGNOSIS — F418 Other specified anxiety disorders: Secondary | ICD-10-CM

## 2022-09-01 ENCOUNTER — Encounter: Payer: Self-pay | Admitting: Family Medicine

## 2022-09-01 DIAGNOSIS — R739 Hyperglycemia, unspecified: Secondary | ICD-10-CM

## 2022-09-01 MED ORDER — PANTOPRAZOLE SODIUM 40 MG PO TBEC: 40.0000 mg | DELAYED_RELEASE_TABLET | Freq: Two times a day (BID) | ORAL | 3 refills | Status: DC

## 2022-09-01 MED ORDER — FENOFIBRATE 145 MG PO TABS: 145.0000 mg | ORAL_TABLET | Freq: Every day | ORAL | 3 refills | Status: DC

## 2022-09-01 MED ORDER — QUETIAPINE FUMARATE 300 MG PO TABS: 300.0000 mg | ORAL_TABLET | Freq: Every day | ORAL | 3 refills | Status: DC

## 2022-09-01 MED ORDER — ROSUVASTATIN CALCIUM 20 MG PO TABS: 20.0000 mg | ORAL_TABLET | Freq: Every day | ORAL | 3 refills | Status: DC

## 2022-09-01 MED ORDER — METFORMIN HCL 500 MG PO TABS: 500.0000 mg | ORAL_TABLET | Freq: Two times a day (BID) | ORAL | 3 refills | Status: DC

## 2022-09-05 ENCOUNTER — Other Ambulatory Visit: Payer: Self-pay | Admitting: Family Medicine

## 2022-09-05 DIAGNOSIS — F418 Other specified anxiety disorders: Secondary | ICD-10-CM

## 2022-09-05 MED ORDER — ESCITALOPRAM OXALATE 10 MG PO TABS: 10.0000 mg | ORAL_TABLET | Freq: Every day | ORAL | 3 refills | Status: DC

## 2022-09-05 MED ORDER — ESCITALOPRAM OXALATE 10 MG PO TABS: 10.0000 mg | ORAL_TABLET | Freq: Every day | ORAL | 0 refills | Status: DC

## 2022-09-05 NOTE — Addendum Note (Signed)
Addended by: Scharlene Gloss B on: 09/05/2022 01:19 PM   Modules accepted: Orders

## 2022-09-22 ENCOUNTER — Other Ambulatory Visit: Payer: Self-pay | Admitting: Neurology

## 2022-11-02 ENCOUNTER — Encounter (HOSPITAL_BASED_OUTPATIENT_CLINIC_OR_DEPARTMENT_OTHER): Payer: Self-pay | Admitting: Emergency Medicine

## 2022-11-02 ENCOUNTER — Other Ambulatory Visit: Payer: Self-pay

## 2022-11-02 ENCOUNTER — Emergency Department (HOSPITAL_BASED_OUTPATIENT_CLINIC_OR_DEPARTMENT_OTHER)
Admission: EM | Admit: 2022-11-02 | Discharge: 2022-11-02 | Disposition: A | Discharge status: Home / Self Care | Attending: Emergency Medicine | Admitting: Emergency Medicine

## 2022-11-02 DIAGNOSIS — R519 Headache, unspecified: Secondary | ICD-10-CM | POA: Diagnosis present

## 2022-11-02 DIAGNOSIS — Z7984 Long term (current) use of oral hypoglycemic drugs: Secondary | ICD-10-CM | POA: Diagnosis not present

## 2022-11-02 DIAGNOSIS — G43919 Migraine, unspecified, intractable, without status migrainosus: Secondary | ICD-10-CM | POA: Diagnosis not present

## 2022-11-02 DIAGNOSIS — J45909 Unspecified asthma, uncomplicated: Secondary | ICD-10-CM | POA: Insufficient documentation

## 2022-11-02 MED ORDER — METOCLOPRAMIDE HCL 5 MG/ML IJ SOLN
10.0000 mg | Freq: Once | INTRAMUSCULAR | Status: AC
  Administered 2022-11-02: 10 mg via INTRAVENOUS
  Filled 2022-11-02: qty 2

## 2022-11-02 MED ORDER — DEXAMETHASONE SODIUM PHOSPHATE 4 MG/ML IJ SOLN
4.0000 mg | Freq: Once | INTRAMUSCULAR | Status: AC
  Administered 2022-11-02: 4 mg via INTRAVENOUS
  Filled 2022-11-02: qty 1

## 2022-11-02 MED ORDER — SODIUM CHLORIDE 0.9 % IV BOLUS
1000.0000 mL | Freq: Once | INTRAVENOUS | Status: AC
  Administered 2022-11-02: 1000 mL via INTRAVENOUS

## 2022-11-02 MED ORDER — HYDROMORPHONE HCL 1 MG/ML IJ SOLN
1.0000 mg | Freq: Once | INTRAMUSCULAR | Status: AC
  Administered 2022-11-02: 1 mg via INTRAVENOUS
  Filled 2022-11-02: qty 1

## 2022-11-02 MED ORDER — DIPHENHYDRAMINE HCL 50 MG/ML IJ SOLN
50.0000 mg | Freq: Once | INTRAMUSCULAR | Status: AC
  Administered 2022-11-02: 50 mg via INTRAVENOUS
  Filled 2022-11-02: qty 1

## 2022-11-02 NOTE — ED Provider Notes (Signed)
[place] EMERGENCY DEPARTMENT AT [facility] Provider Note   CSN: [id number] Arrival date & time: 11/02/22  1821     History  Chief Complaint  Patient presents with   Migraine    Amber [name] is a 42 y.o. female with history of asthma, depression, migraine headaches, GERD, seizures, anxiety, pituitary adenoma, who presents to the emergency department complaining of a migraine headache.  Patient states it feels like her classic migraine.  She has tried all her abortive medications at home without relief.  Has associated nausea and photophobia.  Does not have any hemiplegic signs with this current headache.  Reports significant improvement with migraine cocktail that was given the last time she was in the ER.  Denies any sick symptoms.   Migraine Associated symptoms include headaches.       Home Medications Prior to Admission medications   Medication Sig Start Date End Date Taking? Authorizing Provider  acetaminophen (TYLENOL) 500 MG tablet Take 500 mg by mouth as needed.    [provider]  cabergoline (DOSTINEX) 0.5 MG tablet 2.5 mg weekly (Two tabs on Mondays, 1 tab on wednesdays and 2 tabs on Fridays) 08/23/22   Shamleffer, Konrad Dolores, MD  escitalopram (LEXAPRO) 10 MG tablet TAKE ONE TABLET BY MOUTH EVERY DAY 09/05/22   Carmelia Roller, Jilda Roche, DO  escitalopram (LEXAPRO) 10 MG tablet Take 1 tablet (10 mg total) by mouth daily. 09/05/22   Sharlene Dory, DO  famotidine (PEPCID) 20 MG tablet Take 20 mg by mouth 2 (two) times daily.    [provider]  famotidine (PEPCID) 20 MG tablet Take 1 tablet (20 mg total) by mouth 2 (two) times daily. 11/06/21   Palumbo, April, MD  fenofibrate (TRICOR) 145 MG tablet Take 1 tablet (145 mg total) by mouth daily. 09/01/22   Wendling, Jilda Roche, DO  Galcanezumab-gnlm (EMGALITY) 120 MG/ML SOAJ Inject 120 mg into the skin every 28 (twenty-eight) days. 06/22/22   Everlena Cooper, Adam R, DO  hydrOXYzine  (ATARAX/VISTARIL) 25 MG tablet TAKE 1-3 TABLETS (25-75 MG TOTAL) BY MOUTH 3 (THREE) TIMES DAILY AS NEEDED FOR ANXIETY. 01/22/18   Sharlene Dory, DO  metFORMIN (GLUCOPHAGE) 500 MG tablet Take 1 tablet (500 mg total) by mouth 2 (two) times daily with a meal. 09/01/22   Wendling, Jilda Roche, DO  ondansetron (ZOFRAN) 8 MG tablet Take 1 tablet (8 mg total) by mouth as needed for nausea or vomiting. 06/28/21   Everlena Cooper, Adam R, DO  pantoprazole (PROTONIX) 40 MG tablet Take 1 tablet (40 mg total) by mouth 2 (two) times daily. 09/01/22   Sharlene Dory, DO  predniSONE (DELTASONE) 20 MG tablet 2 tabs po daily x 4 days 11/06/21   Palumbo, April, MD  QUEtiapine (SEROQUEL) 300 MG tablet Take 1 tablet (300 mg total) by mouth at bedtime. 09/01/22   Sharlene Dory, DO  Riboflavin 400 MG CAPS Take 400 mg by mouth daily.    [provider]  Rimegepant Sulfate (NURTEC) 75 MG TBDP Take 1 tablet (75 mg total) by mouth as needed (Daily as needed for a Migraine). Maximum 1 tablet in 24 hours.  Quantity 8. 06/22/22   Everlena Cooper, Adam R, DO  rosuvastatin (CRESTOR) 20 MG tablet Take 1 tablet (20 mg total) by mouth daily. 09/01/22   Sharlene Dory, DO  tiZANidine (ZANAFLEX) 4 MG tablet Take 1 tablet (4 mg total) by mouth every 6 (six) hours as needed for muscle spasms. 11/24/21   Sharlene Dory, DO  topiramate (TOPAMAX) 50 MG tablet Take 1 tablet (50 mg total) by mouth at bedtime. 06/22/22   Drema Dallas, DO  traMADol (ULTRAM) 50 MG tablet TAKE ONE TABLET BY MOUTH EVERY 6 HOURS AS NEEDED 09/23/22   Antony Madura, MD  Vitamin D, Ergocalciferol, (DRISDOL) 1.25 MG (50000 UNIT) CAPS capsule Take 1 capsule (50,000 Units total) by mouth every 7 (seven) days. 11/25/21   Sharlene Dory, DO      Allergies    Aspirin, Beeswax, Chlorhexidine, Ibuprofen, Iodine, Lunesta [eszopiclone], Nsaids, Shellfish allergy, Sulfa antibiotics, Triptans, and Tape    Review of Systems   Review of Systems   Neurological:  Positive for headaches.  All other systems reviewed and are negative.   Physical Exam Updated Vital Signs BP 124/84 (BP Location: Left Arm)   Pulse 99   Temp 98 F (36.7 C) (Oral)   Resp 18   Ht 5\' 6"  (1.676 m)   Wt 127 kg   LMP 10/24/2022   SpO2 98%   BMI 45.19 kg/m  Physical Exam Vitals and nursing note reviewed.  Constitutional:      Appearance: Normal appearance.  HENT:     Head: Normocephalic and atraumatic.  Eyes:     Conjunctiva/sclera: Conjunctivae normal.  Cardiovascular:     Rate and Rhythm: Normal rate and regular rhythm.  Pulmonary:     Effort: Pulmonary effort is normal. No respiratory distress.     Breath sounds: Normal breath sounds.  Abdominal:     General: There is no distension.     Palpations: Abdomen is soft.     Tenderness: There is no abdominal tenderness.  Skin:    General: Skin is warm and dry.  Neurological:     General: No focal deficit present.     Mental Status: She is alert.     Comments: Neuro: Speech is clear, able to follow commands. CN III-XII intact grossly intact. PERRLA. EOMI. Sensation intact throughout. Str 5/5 all extremities.     ED Results / Procedures / Treatments   Labs (all labs ordered are listed, but only abnormal results are displayed) Labs Reviewed - No data to display  EKG None  Radiology No results found.  Procedures Procedures    Medications Ordered in ED Medications  sodium chloride 0.9 % bolus 1,000 mL (1,000 mLs Intravenous New Bag/Given 11/02/22 2236)  diphenhydrAMINE (BENADRYL) injection 50 mg (50 mg Intravenous Given 11/02/22 2238)  dexamethasone (DECADRON) injection 4 mg (4 mg Intravenous Given 11/02/22 2249)  HYDROmorphone (DILAUDID) injection 1 mg (1 mg Intravenous Given 11/02/22 2242)  metoCLOPramide (REGLAN) injection 10 mg (10 mg Intravenous Given 11/02/22 2249)    ED Course/ Medical Decision Making/ A&P                                 Medical Decision  Making Risk Prescription drug management.   This patient is a 42 y.o. female  who presents to the ED for concern of headache.   Differential diagnoses prior to evaluation: The emergent differential diagnosis includes, but is not limited to,  Stroke, increased ICP, meningitis, CVA, intracranial tumor, venous sinus thrombosis, migraine, cluster headache, hypertension, drug related, head injury, tension headache, sinusitis, dental abscess, otitis media, TMJ, temporal arteritis, glaucoma, trigeminal neuralgia. . This is not an exhaustive differential.   Past Medical History / Co-morbidities / Social History: asthma, depression, migraine headaches, GERD, seizures, anxiety, pituitary adenoma  Additional history: Chart  reviewed. Pertinent results include: Listed allergies to NSAIDs. Reviewed ER visit note from March 14 of this year in which patient was given migraine cocktail including Benadryl, dexamethasone, hydromorphone, and Reglan.  Most recent brain imaging from 2021 including MRI, showing stable pituitary microadenoma.  Physical Exam: Physical exam performed. The pertinent findings include: Normal vital signs, no acute distress.  Normal neurologic exam as above.  No meningeal signs.  Medications: I ordered medication including migraine cocktail including IV fluids, Benadryl, hydromorphone, Reglan, Decadron.  I have reviewed the patients home medicines and have made adjustments as needed.  Upon reevaluation patient states that symptoms have resolved and she is ready did go home.   Disposition: After consideration of the diagnostic results and the patients response to treatment, I feel that emergency department workup does not suggest an emergent condition requiring admission or immediate intervention beyond what has been performed at this time. The plan is: Discharge to home with symptomatic management of migraine headache.  Recommended follow-up with PCP and/or neurologist. The patient is safe  for discharge and has been instructed to return immediately for worsening symptoms, change in symptoms or any other concerns.  Final Clinical Impression(s) / ED Diagnoses Final diagnoses:  Intractable migraine without status migrainosus, unspecified migraine type    Rx / DC Orders ED Discharge Orders     None      Portions of this report may have been transcribed using voice recognition software. Every effort was made to ensure accuracy; however, inadvertent computerized transcription errors may be present.    Jeanella Flattery 11/02/22 2336    Alvira Monday, MD 11/03/22 1404

## 2022-11-02 NOTE — ED Triage Notes (Signed)
Pt reports migraine since Sunday with hx of migraines. Reports she has been taking migraine medication with no relief.

## 2022-11-02 NOTE — Discharge Instructions (Signed)
You were seen emergency department today for migraine headache.  I am glad that you are feeling much better after the medications that we gave.  Please follow-up with your primary doctor and/or neurologist as scheduled.  Continue to monitor how you're doing and return to the ER for new or worsening symptoms.

## 2022-11-04 ENCOUNTER — Ambulatory Visit: Admitting: Internal Medicine

## 2022-11-04 ENCOUNTER — Encounter: Payer: Self-pay | Admitting: Internal Medicine

## 2022-11-04 NOTE — Progress Notes (Deleted)
Name: Amber [name]  MRN/ DOB: [id number], 1980/12/24    Age/ Sex: 42 y.o., female     PCP: Sharlene Dory, DO   Reason for Endocrinology Evaluation: Hyperprolactinemia      Initial Endocrinology Clinic Visit: 03/02/2016    PATIENT IDENTIFIER: [name] is a 42 y.o., female with a past medical history of Prolactinoma, GERD, Asthma and migraine headaches . She has followed with [facility] since 03/02/2016 for consultative assistance with management of her hyperprolactinemia .   HISTORICAL SUMMARY: The patient was first diagnosed with hyperprolactinemia in 1996 during evaluation for primary amenorrhea ( highest reading 198 ). She is S/P resection of prolactinoma in 2009 [place], [place] due to size 3 cm, per pt she had a sphenoidal sinus penetration. Per pt prolactin levels normalized and menses resumed.  Pt was noted with elevated prolactin levels again 2015 and was started on Cabergoline, imaging have been reported at " no tumor"     Cabergoline was started 2019 Bromocriptine was added to cabergoline 05/2019 by another endocrinologist  On her initial visit with me in 08/2019, we stopped the Bromocriptine and increased cabergoline. Since the dose exceeded 2 mg/ week , we proceed with an echocardiogram , which showed normal valve structure but there was a question about a retro aortic coronary artery. CT angio was normal.     Repeat pituitary MRI in 08/2019 showed regression of pituitary gland     NO FH of pituitary disease  SUBJECTIVE:    Today (11/04/2022):  [name] is here for a follow up on hyperprolactinemia , Prediabetes and pituitary adenoma.  She has NOT been to our clinic in 20 months   Patient continues to follow-up with neurology for migraine headaches  Has regular menstruation , LMP 12/4th, 2022- regular  Denies nipple discharge  No vision changes   Denies nausea, vomiting or diarrhea   She is on Cabergoline 0.5  mg:  Two tabs on Mondays, 1 tablet on wednesday and 2 tabs on Friday  Metformin 500 mg Twice daily    HISTORY:  Past Medical History:  Past Medical History:  Diagnosis Date   Anxiety    Asthma    Depression    GERD (gastroesophageal reflux disease)    History of chicken pox    History of migraine headaches    Pituitary adenoma (HCC)    Seizures (HCC)    Past Surgical History:  Past Surgical History:  Procedure Laterality Date   APPENDECTOMY     CHOLECYSTECTOMY     Pituiary Tumor     Tumor Removal Pituitary   Social History:  reports that she quit smoking about 6 years ago. Her smoking use included cigarettes. She started smoking about 21 years ago. She has a 15 pack-year smoking history. She has never used smokeless tobacco. She reports that she does not drink alcohol and does not use drugs. Family History:  Family History  Problem Relation Age of Onset   Diabetes Mother    Migraines Mother    Hyperlipidemia Mother    Bipolar disorder Mother    Irritable bowel syndrome Mother    Diabetes Father    Colon cancer Father    Cancer Father        colon   Hyperlipidemia Father    Hypertension Father    Bipolar disorder Brother    Migraines Sister    Other Neg Hx        pituitary disorder   Esophageal cancer  Neg Hx      HOME MEDICATIONS: Allergies as of 11/04/2022       Reactions   Aspirin    Beeswax    Chlorhexidine    Ibuprofen    Iodine    Lunesta [eszopiclone] Other (See Comments)   GI issues   Nsaids Nausea And Vomiting   Shellfish Allergy    Tingle in mouth/throat   Sulfa Antibiotics    Triptans Other (See Comments)   Makes pain worse   Tape Rash   Blisters        Medication List        Accurate as of November 04, 2022  7:15 AM. If you have any questions, ask your nurse or doctor.          acetaminophen 500 MG tablet Commonly known as: TYLENOL Take 500 mg by mouth as needed.   cabergoline 0.5 MG tablet Commonly known as: DOSTINEX 2.5  mg weekly (Two tabs on Mondays, 1 tab on wednesdays and 2 tabs on Fridays)   Emgality 120 MG/ML Soaj Generic drug: Galcanezumab-gnlm Inject 120 mg into the skin every 28 (twenty-eight) days.   escitalopram 10 MG tablet Commonly known as: LEXAPRO TAKE ONE TABLET BY MOUTH EVERY DAY   escitalopram 10 MG tablet Commonly known as: LEXAPRO Take 1 tablet (10 mg total) by mouth daily.   famotidine 20 MG tablet Commonly known as: PEPCID Take 20 mg by mouth 2 (two) times daily.   famotidine 20 MG tablet Commonly known as: Pepcid Take 1 tablet (20 mg total) by mouth 2 (two) times daily.   fenofibrate 145 MG tablet Commonly known as: TRICOR Take 1 tablet (145 mg total) by mouth daily.   hydrOXYzine 25 MG tablet Commonly known as: ATARAX TAKE 1-3 TABLETS (25-75 MG TOTAL) BY MOUTH 3 (THREE) TIMES DAILY AS NEEDED FOR ANXIETY.   metFORMIN 500 MG tablet Commonly known as: GLUCOPHAGE Take 1 tablet (500 mg total) by mouth 2 (two) times daily with a meal.   Nurtec 75 MG Tbdp Generic drug: Rimegepant Sulfate Take 1 tablet (75 mg total) by mouth as needed (Daily as needed for a Migraine). Maximum 1 tablet in 24 hours.  Quantity 8.   ondansetron 8 MG tablet Commonly known as: Zofran Take 1 tablet (8 mg total) by mouth as needed for nausea or vomiting.   pantoprazole 40 MG tablet Commonly known as: PROTONIX Take 1 tablet (40 mg total) by mouth 2 (two) times daily.   predniSONE 20 MG tablet Commonly known as: DELTASONE 2 tabs po daily x 4 days   QUEtiapine 300 MG tablet Commonly known as: SEROQUEL Take 1 tablet (300 mg total) by mouth at bedtime.   Riboflavin 400 MG Caps Take 400 mg by mouth daily.   rosuvastatin 20 MG tablet Commonly known as: Crestor Take 1 tablet (20 mg total) by mouth daily.   tiZANidine 4 MG tablet Commonly known as: Zanaflex Take 1 tablet (4 mg total) by mouth every 6 (six) hours as needed for muscle spasms.   topiramate 50 MG tablet Commonly known as:  Topamax Take 1 tablet (50 mg total) by mouth at bedtime.   traMADol 50 MG tablet Commonly known as: ULTRAM TAKE ONE TABLET BY MOUTH EVERY 6 HOURS AS NEEDED   Vitamin D (Ergocalciferol) 1.25 MG (50000 UNIT) Caps capsule Commonly known as: DRISDOL Take 1 capsule (50,000 Units total) by mouth every 7 (seven) days.          OBJECTIVE:   PHYSICAL EXAM:  VS: LMP 10/24/2022    EXAM: General: Pt appears well and is in NAD  Neck: General: Supple without adenopathy. Thyroid: Thyroid size normal.  No goiter or nodules appreciated.  Lungs: Clear with good BS bilat with no rales, rhonchi, or wheezes  Heart: Auscultation: RRR.  Abdomen: Normoactive bowel sounds, soft, nontender, without masses or organomegaly palpable  Extremities:  BL LE: No pretibial edema normal ROM and strength.  Mental Status: Judgment, insight: Intact Orientation: Oriented to time, place, and person Mood and affect: No depression, anxiety, or agitation      DATA REVIEWED:      Latest Reference Range & Units 03/09/21 13:06  Sodium 135 - 145 mEq/L 140  Potassium 3.5 - 5.1 mEq/L 3.9  Chloride 96 - 112 mEq/L 109  CO2 19 - 32 mEq/L 25  Glucose 70 - 99 mg/dL 82  BUN 6 - 23 mg/dL 12  Creatinine 8.29 - 5.62 mg/dL 1.30 (H)  Calcium 8.4 - 10.5 mg/dL 9.6  Alkaline Phosphatase 39 - 117 U/L 39 - 117 U/L 29 (L) 29 (L)  Albumin 3.5 - 5.2 g/dL 3.5 - 5.2 g/dL 4.2 4.2  AST 0 - 37 U/L 0 - 37 U/L 19 19  ALT 0 - 35 U/L 0 - 35 U/L 22 22  Total Protein 6.0 - 8.3 g/dL 6.0 - 8.3 g/dL 6.8 6.8  Bilirubin, Direct 0.0 - 0.3 mg/dL 0.1  Total Bilirubin 0.2 - 1.2 mg/dL 0.2 - 1.2 mg/dL 0.5 0.5  GFR >86.57 mL/min 54.72 (L)  Total CHOL/HDL Ratio  3  Cholesterol 0 - 200 mg/dL 846  HDL Cholesterol >96.29 mg/dL 52.84 (L)  LDL (calc) 0 - 99 mg/dL 42  NonHDL  13.24  Triglycerides 0.0 - 149.0 mg/dL 401.0  VLDL 0.0 - 27.2 mg/dL 53.6     Latest Reference Range & Units 03/09/21 13:06  Prolactin ng/mL 34.6 (H)  Glucose  70 - 99 mg/dL 82  Hemoglobin U4Q 4.6 - 6.5 % 5.8  TSH 0.35 - 5.50 uIU/mL 0.91  T4,Free(Direct) 0.60 - 1.60 ng/dL 0.34     MRI 7/42/5956  Dedicated pituitary imaging. Gland size appears slightly smaller now compared to 2018 (series 16, image 9 today versus series 17, image 6 previously). No suprasellar mass or mass effect. Infundibulum and hypothalamus appear normal.   Regressed hypoenhancing area in the left inferior and lateral aspect of the gland. Today there is only subtle heterogeneous enhancement on that side (series 7, image 10) encompassing 3-4 mm. The cavernous sinuses remain normal. Normal clivus. No other abnormal enhancement.   IMPRESSION: 1. Regression of pituitary microadenoma since 2018, subtle residual heterogeneity in the left aspect of the gland encompassing 3-4 mm.   2. No new intracranial abnormality. Stable mild nonspecific cerebral white matter signal changes. ASSESSMENT / PLAN / RECOMMENDATIONS:   Hx of Pituitary Macroadenoma , S/P Transphenoidal surgery in 2009:  - Pr pt she had a transphenoidal surgery for a 3 cm pituitary adenoma in 2009 - Her last MRI was in 08/2019 indicating regression  - Labs have been normal to include TSH, ACTH , cortisol and IGF-1 (08/2019)    Hyperprolactinemia :  - Diagnosed in 1996 - Normalization of levels following transphenoidal pituitary resection in 2009 , restarted on cabergoline in 2015 and again in 2019.  - In March 2021, bromocriptine was added to her regimen by previouse endocrinologist, this was discontinued in 08/2019 and increased cabergoline.  - Pt will need echocardiogram in 09/2021  - Pt understands to stop Cabergoline with  positive pregnancy test and to notify our office  -Prolactin is slightly elevated but she just had a recent ED visit for migraine headaches and she was given a cocktail of medication that included Dilaudid as well as taking tramadol, I am not going to make any changes to her cabergoline dose  as narcotics can cause short-term elevation of prolactin, we will continue to monitor    Medications   Continue cabergoline 0.5 mg ,a total of  2.5 mg weekly (Two tabs on Mondays, 1 tab on wednesdays and 2 tabs on Fridays)   2. Pre-Diabetes :   -A1c stable at 5.8% -No changes  Continue Metformin 500 mg Twice daily     F/U in 6 months      Signed electronically by: Lyndle Herrlich, MD  [facility]  [facility] [address] [place] [place], [place] [address] Phone: [phone] FAX: [phone]      CC: Sharlene Dory, DO [address] [place] [address] Phone: [phone]  Fax: [phone]   Return to Endocrinology clinic as below: Future Appointments  Date Time Provider Department Center  11/04/2022  9:50 AM Karie Skowron, Konrad Dolores, MD [id number] None  12/23/2022  2:30 PM Drema Dallas, DO [facility] None

## 2022-11-10 ENCOUNTER — Other Ambulatory Visit: Payer: Self-pay

## 2022-11-10 ENCOUNTER — Encounter (HOSPITAL_BASED_OUTPATIENT_CLINIC_OR_DEPARTMENT_OTHER): Payer: Self-pay | Admitting: Emergency Medicine

## 2022-11-10 DIAGNOSIS — Z7984 Long term (current) use of oral hypoglycemic drugs: Secondary | ICD-10-CM | POA: Insufficient documentation

## 2022-11-10 DIAGNOSIS — G43809 Other migraine, not intractable, without status migrainosus: Secondary | ICD-10-CM | POA: Diagnosis not present

## 2022-11-10 DIAGNOSIS — R519 Headache, unspecified: Secondary | ICD-10-CM | POA: Diagnosis present

## 2022-11-10 NOTE — ED Triage Notes (Signed)
Reports migraine associated with nausea onset 2 AM this morning. No vomiting. Reports h/o of migraine and see's neurology. No relief with home breakthrough meds.

## 2022-11-11 ENCOUNTER — Emergency Department (HOSPITAL_BASED_OUTPATIENT_CLINIC_OR_DEPARTMENT_OTHER)
Admission: EM | Admit: 2022-11-11 | Discharge: 2022-11-11 | Disposition: A | Discharge status: Home / Self Care | Source: Home / Self Care | Attending: Emergency Medicine | Admitting: Emergency Medicine

## 2022-11-11 DIAGNOSIS — G43809 Other migraine, not intractable, without status migrainosus: Secondary | ICD-10-CM

## 2022-11-11 MED ORDER — DIPHENHYDRAMINE HCL 50 MG/ML IJ SOLN
25.0000 mg | Freq: Once | INTRAMUSCULAR | Status: AC
  Administered 2022-11-11: 25 mg via INTRAVENOUS
  Filled 2022-11-11: qty 1

## 2022-11-11 MED ORDER — METOCLOPRAMIDE HCL 5 MG/ML IJ SOLN
10.0000 mg | Freq: Once | INTRAMUSCULAR | Status: AC
  Administered 2022-11-11: 10 mg via INTRAVENOUS
  Filled 2022-11-11: qty 2

## 2022-11-11 MED ORDER — HYDROMORPHONE HCL 1 MG/ML IJ SOLN
1.0000 mg | Freq: Once | INTRAMUSCULAR | Status: AC
  Administered 2022-11-11: 1 mg via INTRAVENOUS
  Filled 2022-11-11: qty 1

## 2022-11-11 MED ORDER — SODIUM CHLORIDE 0.9 % IV BOLUS
1000.0000 mL | Freq: Once | INTRAVENOUS | Status: AC
  Administered 2022-11-11: 1000 mL via INTRAVENOUS

## 2022-11-11 MED ORDER — DEXAMETHASONE SODIUM PHOSPHATE 10 MG/ML IJ SOLN
10.0000 mg | Freq: Once | INTRAMUSCULAR | Status: AC
  Administered 2022-11-11: 10 mg via INTRAVENOUS
  Filled 2022-11-11: qty 1

## 2022-11-11 NOTE — ED Provider Notes (Signed)
[facility]  Provider Note  CSN: [id number] Arrival date & time: 11/10/22 2155  History Chief Complaint  Patient presents with   Headache    Amber [name] is a 42 y.o. female with history of chronic migraines reports typical headache for about 24 hours not relieved with her home meds. She reports she does not get relief from the typical migraine cocktail and does not tolerate NSAIDs. She requests Decadron, dilaudid, benadryl and reglan as well as IVF as this has worked well for her in the past. She denies ever having had rebound headache from opioids.    Home Medications Prior to Admission medications   Medication Sig Start Date End Date Taking? Authorizing Provider  acetaminophen (TYLENOL) 500 MG tablet Take 500 mg by mouth as needed.    [provider]  cabergoline (DOSTINEX) 0.5 MG tablet 2.5 mg weekly (Two tabs on Mondays, 1 tab on wednesdays and 2 tabs on Fridays) 08/23/22   Shamleffer, Konrad Dolores, MD  escitalopram (LEXAPRO) 10 MG tablet TAKE ONE TABLET BY MOUTH EVERY DAY 09/05/22   Carmelia Roller, Jilda Roche, DO  escitalopram (LEXAPRO) 10 MG tablet Take 1 tablet (10 mg total) by mouth daily. 09/05/22   Sharlene Dory, DO  famotidine (PEPCID) 20 MG tablet Take 20 mg by mouth 2 (two) times daily.    [provider]  famotidine (PEPCID) 20 MG tablet Take 1 tablet (20 mg total) by mouth 2 (two) times daily. 11/06/21   Palumbo, April, MD  fenofibrate (TRICOR) 145 MG tablet Take 1 tablet (145 mg total) by mouth daily. 09/01/22   Wendling, Jilda Roche, DO  Galcanezumab-gnlm (EMGALITY) 120 MG/ML SOAJ Inject 120 mg into the skin every 28 (twenty-eight) days. 06/22/22   Everlena Cooper, Adam R, DO  hydrOXYzine (ATARAX/VISTARIL) 25 MG tablet TAKE 1-3 TABLETS (25-75 MG TOTAL) BY MOUTH 3 (THREE) TIMES DAILY AS NEEDED FOR ANXIETY. 01/22/18   Sharlene Dory, DO  metFORMIN (GLUCOPHAGE) 500 MG tablet Take 1 tablet (500 mg total)  by mouth 2 (two) times daily with a meal. 09/01/22   Wendling, Jilda Roche, DO  ondansetron (ZOFRAN) 8 MG tablet Take 1 tablet (8 mg total) by mouth as needed for nausea or vomiting. 06/28/21   Everlena Cooper, Adam R, DO  pantoprazole (PROTONIX) 40 MG tablet Take 1 tablet (40 mg total) by mouth 2 (two) times daily. 09/01/22   Sharlene Dory, DO  predniSONE (DELTASONE) 20 MG tablet 2 tabs po daily x 4 days 11/06/21   Palumbo, April, MD  QUEtiapine (SEROQUEL) 300 MG tablet Take 1 tablet (300 mg total) by mouth at bedtime. 09/01/22   Sharlene Dory, DO  Riboflavin 400 MG CAPS Take 400 mg by mouth daily.    [provider]  Rimegepant Sulfate (NURTEC) 75 MG TBDP Take 1 tablet (75 mg total) by mouth as needed (Daily as needed for a Migraine). Maximum 1 tablet in 24 hours.  Quantity 8. 06/22/22   Everlena Cooper, Adam R, DO  rosuvastatin (CRESTOR) 20 MG tablet Take 1 tablet (20 mg total) by mouth daily. 09/01/22   Sharlene Dory, DO  tiZANidine (ZANAFLEX) 4 MG tablet Take 1 tablet (4 mg total) by mouth every 6 (six) hours as needed for muscle spasms. 11/24/21   Sharlene Dory, DO  topiramate (TOPAMAX) 50 MG tablet Take 1 tablet (50 mg total) by mouth at bedtime. 06/22/22   Everlena Cooper, Adam R, DO  traMADol (ULTRAM) 50 MG tablet TAKE ONE TABLET BY  MOUTH EVERY 6 HOURS AS NEEDED 09/23/22   Antony Madura, MD  Vitamin D, Ergocalciferol, (DRISDOL) 1.25 MG (50000 UNIT) CAPS capsule Take 1 capsule (50,000 Units total) by mouth every 7 (seven) days. 11/25/21   Sharlene Dory, DO     Allergies    Aspirin, Beeswax, Chlorhexidine, Ibuprofen, Iodine, Lunesta [eszopiclone], Nsaids, Shellfish allergy, Sulfa antibiotics, Triptans, and Tape   Review of Systems   Review of Systems Please see HPI for pertinent positives and negatives  Physical Exam BP 114/76 (BP Location: Right Arm)   Pulse (!) 102   Temp 98.6 F (37 C) (Oral)   Resp 16   Ht 5\' 6"  (1.676 m)   Wt 122.5 kg   LMP 10/24/2022    SpO2 97%   BMI 43.58 kg/m   Physical Exam Vitals and nursing note reviewed.  HENT:     Head: Normocephalic.     Nose: Nose normal.  Eyes:     Extraocular Movements: Extraocular movements intact.  Pulmonary:     Effort: Pulmonary effort is normal.  Musculoskeletal:        General: Normal range of motion.     Cervical back: Neck supple.  Skin:    Findings: No rash (on exposed skin).  Neurological:     General: No focal deficit present.     Mental Status: She is alert and oriented to person, place, and time.  Psychiatric:        Mood and Affect: Mood normal.     ED Results / Procedures / Treatments   EKG None  Procedures Procedures  Medications Ordered in the ED Medications  sodium chloride 0.9 % bolus 1,000 mL (0 mLs Intravenous Stopped 11/11/22 0227)  HYDROmorphone (DILAUDID) injection 1 mg (1 mg Intravenous Given 11/11/22 0118)  dexamethasone (DECADRON) injection 10 mg (10 mg Intravenous Given 11/11/22 0115)  metoCLOPramide (REGLAN) injection 10 mg (10 mg Intravenous Given 11/11/22 0115)  diphenhydrAMINE (BENADRYL) injection 25 mg (25 mg Intravenous Given 11/11/22 0115)    Initial Impression and Plan  Patient here with her typical migraine, requesting meds as above as this has worked well for her in the past.   ED Course   Clinical Course as of 11/11/22 0250  Fri Nov 11, 2022  0227 Patient sleeping soundly on re-eval, feeling better and ready to go home. Recommend neurology follow up. RTED for any other concerns.  [CS]    Clinical Course User Index [CS] Pollyann Savoy, MD     MDM Rules/Calculators/A&P Medical Decision Making Problems Addressed: Other migraine without status migrainosus, not intractable: acute illness or injury  Risk Prescription drug management. Parenteral controlled substances.     Final Clinical Impression(s) / ED Diagnoses Final diagnoses:  Other migraine without status migrainosus, not intractable    Rx / DC Orders ED  Discharge Orders     None        Pollyann Savoy, MD 11/11/22 [phone]

## 2022-11-14 ENCOUNTER — Encounter: Payer: Self-pay | Admitting: Family Medicine

## 2022-11-22 ENCOUNTER — Emergency Department (HOSPITAL_BASED_OUTPATIENT_CLINIC_OR_DEPARTMENT_OTHER)
Admission: EM | Admit: 2022-11-22 | Discharge: 2022-11-22 | Disposition: A | Discharge status: Home / Self Care | Source: Home / Self Care | Attending: Emergency Medicine | Admitting: Emergency Medicine

## 2022-11-22 ENCOUNTER — Other Ambulatory Visit: Payer: Self-pay

## 2022-11-22 ENCOUNTER — Encounter (HOSPITAL_BASED_OUTPATIENT_CLINIC_OR_DEPARTMENT_OTHER): Payer: Self-pay | Admitting: Emergency Medicine

## 2022-11-22 ENCOUNTER — Telehealth: Payer: Self-pay

## 2022-11-22 DIAGNOSIS — R200 Anesthesia of skin: Secondary | ICD-10-CM | POA: Diagnosis present

## 2022-11-22 DIAGNOSIS — R519 Headache, unspecified: Secondary | ICD-10-CM | POA: Insufficient documentation

## 2022-11-22 NOTE — Telephone Encounter (Signed)
Pt seen in ED 

## 2022-11-22 NOTE — Discharge Instructions (Signed)
Follow-up Wednesday with your primary care doctor or if you can get in sooner with your neurologist that may be a better choice.  Return to the emergency room if you have any worsening symptoms.

## 2022-11-22 NOTE — Telephone Encounter (Signed)
Initial Comment Caller says she may need a sooner appt she says she is having numbness on her left side. GOTO Facility Not Listed The one on [place] [facility] Translation No Nurse Assessment Nurse: Suezanne Jacquet, RN, Riley Lam Date/Time (Eastern Time): 11/22/2022 9:51:11 AM Confirm and document reason for call. If symptomatic, describe symptoms. ---Left side numbness that began 4 days ago. Pinky finger and down that side of hand and arm. Pinky toes to her hip on the left side. The left side of rib and chest. No history of htn, even though she has the numbness can move that side and not pain with it however had a migraine on Sunday and Monday but now gone. Does the patient have any new or worsening symptoms? ---Yes Will a triage be completed? ---Yes Related visit to physician within the last 2 weeks? ---No Does the PT have any chronic conditions? (i.e. diabetes, asthma, this includes High risk factors for pregnancy, etc.) ---Yes List chronic conditions. ---chronic migraines, pit tumor, OCD,Gerd, Is the patient pregnant or possibly pregnant? (Ask all females between the ages of 29-55) ---No Is this a behavioral health or substance abuse call? ---No Guidelines Guideline Title Affirmed Question Affirmed Notes Nurse Date/Time (Eastern Time) Neurologic Deficit Patient sounds very sick or weak to the triager Suezanne Jacquet, RN, Riley Lam 11/22/2022 9:54:29 AM PLEASE NOTE: All timestamps contained within this report are represented as [place] Standard Time. CONFIDENTIALTY NOTICE: This fax transmission is intended only for the addressee. It contains information that is legally privileged, confidential or otherwise protected from use or disclosure. If you are not the intended recipient, you are strictly prohibited from reviewing, disclosing, copying using or disseminating any of this information or taking any action in reliance on or regarding this information. If you have received this fax in error, please notify us  immediately by telephone so that we can arrange for its return to [place]. Phone: [phone], Toll-Free: [phone], Fax: [phone] Page: 2 of 2 Call Id: [id number] Disp. Time Lamount Cohen Time) Disposition Final User 11/22/2022 9:46:41 AM Send to Urgent Queue Imagene Riches 11/22/2022 9:57:44 AM Go to ED Now Yes Suezanne Jacquet, RN, Riley Lam Final Disposition 11/22/2022 9:57:44 AM Go to ED Now Yes Suezanne Jacquet, RN, Riley Lam Disposition Overriden: Go to ED Now (or PCP triage) Override Reason: Patient's symptoms need a higher level of care Caller Disagree/Comply Comply Caller Understands Yes PreDisposition Call Doctor Care Advice Given Per Guideline GO TO ED NOW: * Leave now. Drive carefully. * Another adult should drive. CARE ADVICE given per Neurologic Deficit (Adult) guideline. ANOTHER ADULT SHOULD DRIVE: * It is better and safer if another adult drives instead of you. Comments User: Cameron Proud, RN Date/Time Lamount Cohen Time): 11/22/2022 9:59:17 AM Upgraded to ER now even though symptoms have gone on for 4 days. Symptoms sitll there. Referrals GO TO FACILITY OTHER - SPECIFY

## 2022-11-22 NOTE — ED Notes (Signed)
Reviewed discharge instructions with patient. States understanding. Ambulatory at discharge

## 2022-11-22 NOTE — ED Triage Notes (Addendum)
Numbness that started on sat  left side, denies issues walking talking or eating VAN  is neg , denies double vision some of face , some finger left hand down leg to to toes

## 2022-11-22 NOTE — ED Provider Notes (Addendum)
[place] [facility] Provider Note   CSN: [id number] Arrival date & time: 11/22/22  1039     History  Chief Complaint  Patient presents with   Numbness    Amber [name] is a 42 y.o. female.  Patient is a 42 year old female who presents with numbness.  She says been going on for the last 3 days.  She woke up with it on Saturday morning.  It has been pretty constant since then.  She noticed some numbness to the left fourth and fifth fingers and the left fourth and fifth digits of her feet.  She also notices some numbness to the left side of her face.  She denies any weakness in her arms or legs.  No difficulty with her balance.  No vision changes.  No speech deficits.  She denies any neck pain.  No recent injuries.  She does have a history of migraines and had some mild headaches over the last couple days but nothing out of the ordinary.  She is followed by neurology.       Home Medications Prior to Admission medications   Medication Sig Start Date End Date Taking? Authorizing Provider  acetaminophen (TYLENOL) 500 MG tablet Take 500 mg by mouth as needed.    [provider]  cabergoline (DOSTINEX) 0.5 MG tablet 2.5 mg weekly (Two tabs on Mondays, 1 tab on wednesdays and 2 tabs on Fridays) 08/23/22   Shamleffer, Konrad Dolores, MD  escitalopram (LEXAPRO) 10 MG tablet TAKE ONE TABLET BY MOUTH EVERY DAY 09/05/22   Carmelia Roller, Jilda Roche, DO  escitalopram (LEXAPRO) 10 MG tablet Take 1 tablet (10 mg total) by mouth daily. 09/05/22   Sharlene Dory, DO  famotidine (PEPCID) 20 MG tablet Take 20 mg by mouth 2 (two) times daily.    [provider]  famotidine (PEPCID) 20 MG tablet Take 1 tablet (20 mg total) by mouth 2 (two) times daily. 11/06/21   Palumbo, April, MD  fenofibrate (TRICOR) 145 MG tablet Take 1 tablet (145 mg total) by mouth daily. 09/01/22   Wendling, Jilda Roche, DO  Galcanezumab-gnlm (EMGALITY) 120 MG/ML SOAJ  Inject 120 mg into the skin every 28 (twenty-eight) days. 06/22/22   Everlena Cooper, Adam R, DO  hydrOXYzine (ATARAX/VISTARIL) 25 MG tablet TAKE 1-3 TABLETS (25-75 MG TOTAL) BY MOUTH 3 (THREE) TIMES DAILY AS NEEDED FOR ANXIETY. 01/22/18   Sharlene Dory, DO  metFORMIN (GLUCOPHAGE) 500 MG tablet Take 1 tablet (500 mg total) by mouth 2 (two) times daily with a meal. 09/01/22   Wendling, Jilda Roche, DO  ondansetron (ZOFRAN) 8 MG tablet Take 1 tablet (8 mg total) by mouth as needed for nausea or vomiting. 06/28/21   Everlena Cooper, Adam R, DO  pantoprazole (PROTONIX) 40 MG tablet Take 1 tablet (40 mg total) by mouth 2 (two) times daily. 09/01/22   Sharlene Dory, DO  predniSONE (DELTASONE) 20 MG tablet 2 tabs po daily x 4 days 11/06/21   Palumbo, April, MD  QUEtiapine (SEROQUEL) 300 MG tablet Take 1 tablet (300 mg total) by mouth at bedtime. 09/01/22   Sharlene Dory, DO  Riboflavin 400 MG CAPS Take 400 mg by mouth daily.    [provider]  Rimegepant Sulfate (NURTEC) 75 MG TBDP Take 1 tablet (75 mg total) by mouth as needed (Daily as needed for a Migraine). Maximum 1 tablet in 24 hours.  Quantity 8. 06/22/22   Everlena Cooper, Adam R, DO  rosuvastatin (CRESTOR) 20 MG tablet  Take 1 tablet (20 mg total) by mouth daily. 09/01/22   Sharlene Dory, DO  tiZANidine (ZANAFLEX) 4 MG tablet Take 1 tablet (4 mg total) by mouth every 6 (six) hours as needed for muscle spasms. 11/24/21   Sharlene Dory, DO  topiramate (TOPAMAX) 50 MG tablet Take 1 tablet (50 mg total) by mouth at bedtime. 06/22/22   Drema Dallas, DO  traMADol (ULTRAM) 50 MG tablet TAKE ONE TABLET BY MOUTH EVERY 6 HOURS AS NEEDED 09/23/22   Antony Madura, MD  Vitamin D, Ergocalciferol, (DRISDOL) 1.25 MG (50000 UNIT) CAPS capsule Take 1 capsule (50,000 Units total) by mouth every 7 (seven) days. 11/25/21   Sharlene Dory, DO      Allergies    Aspirin, Beeswax, Chlorhexidine, Ibuprofen, Iodine, Lunesta [eszopiclone], Nsaids,  Shellfish allergy, Sulfa antibiotics, Triptans, and Tape    Review of Systems   Review of Systems  Constitutional:  Negative for chills, diaphoresis, fatigue and fever.  HENT:  Negative for congestion, rhinorrhea and sneezing.   Eyes: Negative.   Respiratory:  Negative for cough, chest tightness and shortness of breath.   Cardiovascular:  Negative for chest pain and leg swelling.  Gastrointestinal:  Negative for abdominal pain, blood in stool, diarrhea, nausea and vomiting.  Genitourinary:  Negative for difficulty urinating, flank pain, frequency and hematuria.  Musculoskeletal:  Negative for arthralgias and back pain.  Skin:  Negative for rash.  Neurological:  Positive for numbness and headaches. Negative for dizziness, speech difficulty and weakness.    Physical Exam Updated Vital Signs BP 120/88 (BP Location: Right Arm)   Pulse (!) 109   Temp 98.7 F (37.1 C) (Oral)   Resp 18   Ht 5\' 6"  (1.676 m)   Wt 113.4 kg   LMP 10/24/2022   SpO2 98%   BMI 40.35 kg/m  Physical Exam Constitutional:      Appearance: She is well-developed.  HENT:     Head: Normocephalic and atraumatic.  Eyes:     Extraocular Movements: Extraocular movements intact.     Pupils: Pupils are equal, round, and reactive to light.  Cardiovascular:     Rate and Rhythm: Normal rate and regular rhythm.     Heart sounds: Normal heart sounds.  Pulmonary:     Effort: Pulmonary effort is normal. No respiratory distress.     Breath sounds: Normal breath sounds. No wheezing or rales.  Chest:     Chest wall: No tenderness.  Abdominal:     General: Bowel sounds are normal.     Palpations: Abdomen is soft.     Tenderness: There is no abdominal tenderness. There is no guarding or rebound.  Musculoskeletal:        General: Normal range of motion.     Cervical back: Normal range of motion and neck supple.  Lymphadenopathy:     Cervical: No cervical adenopathy.  Skin:    General: Skin is warm and dry.      Findings: No rash.  Neurological:     Mental Status: She is alert and oriented to person, place, and time.     Comments: Motor 5/5 all extremities Sensation diminished to light touch in the left side of her face including her forehead and also in the lateral aspect of her left arm and the lateral aspect of her left foot although sensation is normal on the medial aspect of her left arm and left leg. Finger to Nose intact, no pronator drift CN II-XII grossly  intact Gait normal      ED Results / Procedures / Treatments   Labs (all labs ordered are listed, but only abnormal results are displayed) Labs Reviewed - No data to display  EKG None  Radiology No results found.  Procedures Procedures    Medications Ordered in ED Medications - No data to display  ED Course/ Medical Decision Making/ A&P                                 Medical Decision Making  Patient presents with numbness for the last 3 days to the left side of her face in the lateral aspect of her arm and leg.  She does not have any neck pain on palpation.  She does not have any other neurologic deficits.  No associated weakness or other concerns for stroke.  I suspect this is probably coming from nerve impingement, possibly in her neck.  She does not currently have a headache.  She does not have any other symptoms that sound more concerning for carotid dissection.  She does not have any chest pain or other symptoms that sound more concerning for dissection.  She does not have any current headaches so would doubt complex migraine.  I discussed with her that she may need an MRI of her cervical spine or further nerve testing.  We discussed options of transferring her to [facility] for emergent imaging although she would rather have this scheduled by her primary care doctor.  Given this has been going on since Saturday and I do not appreciate any other neurologic deficits, I feel that this is appropriate.  She has an upcoming  appointment in 1 week with her primary care doctor.  I actually advised her to contact her neurologist to see if they were able to get her in to be seen sooner.  She was discharged home in good condition.  She was given strict return precautions.  Final Clinical Impression(s) / ED Diagnoses Final diagnoses:  Numbness    Rx / DC Orders ED Discharge Orders     None         Rolan Bucco, MD 11/22/22 1155    Rolan Bucco, MD 11/22/22 1155

## 2022-11-24 ENCOUNTER — Encounter (HOSPITAL_BASED_OUTPATIENT_CLINIC_OR_DEPARTMENT_OTHER): Payer: Self-pay

## 2022-11-24 ENCOUNTER — Emergency Department (HOSPITAL_BASED_OUTPATIENT_CLINIC_OR_DEPARTMENT_OTHER)

## 2022-11-24 ENCOUNTER — Emergency Department (HOSPITAL_BASED_OUTPATIENT_CLINIC_OR_DEPARTMENT_OTHER)
Admission: EM | Admit: 2022-11-24 | Discharge: 2022-11-25 | Discharge status: Against Medical Advice | Attending: Emergency Medicine | Admitting: Emergency Medicine

## 2022-11-24 ENCOUNTER — Other Ambulatory Visit: Payer: Self-pay

## 2022-11-24 DIAGNOSIS — J45909 Unspecified asthma, uncomplicated: Secondary | ICD-10-CM | POA: Diagnosis not present

## 2022-11-24 DIAGNOSIS — R202 Paresthesia of skin: Secondary | ICD-10-CM | POA: Diagnosis not present

## 2022-11-24 DIAGNOSIS — G43909 Migraine, unspecified, not intractable, without status migrainosus: Secondary | ICD-10-CM

## 2022-11-24 DIAGNOSIS — Z7951 Long term (current) use of inhaled steroids: Secondary | ICD-10-CM | POA: Insufficient documentation

## 2022-11-24 DIAGNOSIS — R519 Headache, unspecified: Secondary | ICD-10-CM | POA: Diagnosis present

## 2022-11-24 HISTORY — DX: Migraine, unspecified, not intractable, without status migrainosus: G43.909

## 2022-11-24 MED ORDER — DROPERIDOL 2.5 MG/ML IJ SOLN
1.2500 mg | Freq: Once | INTRAMUSCULAR | Status: AC
  Administered 2022-11-24: 1.25 mg via INTRAVENOUS
  Filled 2022-11-24: qty 2

## 2022-11-24 MED ORDER — DEXAMETHASONE SODIUM PHOSPHATE 10 MG/ML IJ SOLN
10.0000 mg | Freq: Once | INTRAMUSCULAR | Status: AC
  Administered 2022-11-24: 10 mg via INTRAVENOUS
  Filled 2022-11-24: qty 1

## 2022-11-24 MED ORDER — MAGNESIUM SULFATE 50 % IJ SOLN
2.0000 g | Freq: Once | INTRAMUSCULAR | Status: AC
  Administered 2022-11-24: 2 g via INTRAVENOUS
  Filled 2022-11-24: qty 4

## 2022-11-24 MED ORDER — SODIUM CHLORIDE 0.9 % IV BOLUS
500.0000 mL | Freq: Once | INTRAVENOUS | Status: AC
  Administered 2022-11-24: 500 mL via INTRAVENOUS

## 2022-11-24 NOTE — ED Notes (Signed)
Patient transported to CT 

## 2022-11-24 NOTE — ED Triage Notes (Signed)
Pt states she has a migraine that started this afternoon, has taken her meds and has not been able to keep them down. +N/V

## 2022-11-24 NOTE — ED Notes (Signed)
[facility] called to transport @ 23:51 No Current ETA.... Patient going ED to ED to [facility]

## 2022-11-25 NOTE — ED Provider Notes (Signed)
[place] [facility] AT [facility] Provider Note   CSN: [id number] Arrival date & time: 11/24/22  2058     History  Chief Complaint  Patient presents with   Migraine    Amber [name] is a 42 y.o. female.  The history is provided by the patient.  Migraine This is a recurrent problem. The current episode started 6 to 12 hours ago. The problem occurs constantly. The problem has not changed since onset.Associated symptoms include headaches. Pertinent negatives include no chest pain, no abdominal pain and no shortness of breath. Nothing aggravates the symptoms. Nothing relieves the symptoms. She has tried nothing for the symptoms. The treatment provided no relief.  Patient with migraines and depression presents with recurrent headache left sided and some pain at the base of the skull with change in sensation to the ulnar 2.5 fingers on left and last 2 toeas on the left.  No fevers, no neck pain, no trauma.      Past Medical History:  Diagnosis Date   Anxiety    Asthma    Depression    GERD (gastroesophageal reflux disease)    History of chicken pox    History of migraine headaches    Migraines    Pituitary adenoma (HCC)    Seizures (HCC)      Home Medications Prior to Admission medications   Medication Sig Start Date End Date Taking? Authorizing Provider  acetaminophen (TYLENOL) 500 MG tablet Take 500 mg by mouth as needed.    [provider]  cabergoline (DOSTINEX) 0.5 MG tablet 2.5 mg weekly (Two tabs on Mondays, 1 tab on wednesdays and 2 tabs on Fridays) 08/23/22   Shamleffer, Konrad Dolores, MD  escitalopram (LEXAPRO) 10 MG tablet TAKE ONE TABLET BY MOUTH EVERY DAY 09/05/22   Carmelia Roller, Jilda Roche, DO  escitalopram (LEXAPRO) 10 MG tablet Take 1 tablet (10 mg total) by mouth daily. 09/05/22   Sharlene Dory, DO  famotidine (PEPCID) 20 MG tablet Take 20 mg by mouth 2 (two) times daily.    [provider]  famotidine  (PEPCID) 20 MG tablet Take 1 tablet (20 mg total) by mouth 2 (two) times daily. 11/06/21   Kennet Mccort, MD  fenofibrate (TRICOR) 145 MG tablet Take 1 tablet (145 mg total) by mouth daily. 09/01/22   Wendling, Jilda Roche, DO  Galcanezumab-gnlm (EMGALITY) 120 MG/ML SOAJ Inject 120 mg into the skin every 28 (twenty-eight) days. 06/22/22   Everlena Cooper, Adam R, DO  hydrOXYzine (ATARAX/VISTARIL) 25 MG tablet TAKE 1-3 TABLETS (25-75 MG TOTAL) BY MOUTH 3 (THREE) TIMES DAILY AS NEEDED FOR ANXIETY. 01/22/18   Sharlene Dory, DO  metFORMIN (GLUCOPHAGE) 500 MG tablet Take 1 tablet (500 mg total) by mouth 2 (two) times daily with a meal. 09/01/22   Wendling, Jilda Roche, DO  ondansetron (ZOFRAN) 8 MG tablet Take 1 tablet (8 mg total) by mouth as needed for nausea or vomiting. 06/28/21   Everlena Cooper, Adam R, DO  pantoprazole (PROTONIX) 40 MG tablet Take 1 tablet (40 mg total) by mouth 2 (two) times daily. 09/01/22   Sharlene Dory, DO  predniSONE (DELTASONE) 20 MG tablet 2 tabs po daily x 4 days 11/06/21   Deondrea Markos, MD  QUEtiapine (SEROQUEL) 300 MG tablet Take 1 tablet (300 mg total) by mouth at bedtime. 09/01/22   Sharlene Dory, DO  Riboflavin 400 MG CAPS Take 400 mg by mouth daily.    [provider]  Rimegepant Sulfate (NURTEC) 75 MG  TBDP Take 1 tablet (75 mg total) by mouth as needed (Daily as needed for a Migraine). Maximum 1 tablet in 24 hours.  Quantity 8. 06/22/22   Everlena Cooper, Adam R, DO  rosuvastatin (CRESTOR) 20 MG tablet Take 1 tablet (20 mg total) by mouth daily. 09/01/22   Sharlene Dory, DO  tiZANidine (ZANAFLEX) 4 MG tablet Take 1 tablet (4 mg total) by mouth every 6 (six) hours as needed for muscle spasms. 11/24/21   Sharlene Dory, DO  topiramate (TOPAMAX) 50 MG tablet Take 1 tablet (50 mg total) by mouth at bedtime. 06/22/22   Drema Dallas, DO  traMADol (ULTRAM) 50 MG tablet TAKE ONE TABLET BY MOUTH EVERY 6 HOURS AS NEEDED 09/23/22   Antony Madura, MD  Vitamin  D, Ergocalciferol, (DRISDOL) 1.25 MG (50000 UNIT) CAPS capsule Take 1 capsule (50,000 Units total) by mouth every 7 (seven) days. 11/25/21   Sharlene Dory, DO      Allergies    Aspirin, Beeswax, Chlorhexidine, Ibuprofen, Iodine, Lunesta [eszopiclone], Nsaids, Shellfish allergy, Sulfa antibiotics, Triptans, and Tape    Review of Systems   Review of Systems  Constitutional:  Negative for fever.  HENT:  Negative for facial swelling.   Eyes:  Negative for photophobia, redness and visual disturbance.  Respiratory:  Negative for shortness of breath.   Cardiovascular:  Negative for chest pain.  Gastrointestinal:  Negative for abdominal pain.  Musculoskeletal:  Negative for neck pain and neck stiffness.  Skin:  Negative for rash.  Neurological:  Positive for headaches. Negative for seizures, facial asymmetry and weakness.  All other systems reviewed and are negative.   Physical Exam Updated Vital Signs BP (!) 131/91 (BP Location: Left Arm)   Pulse (!) 102   Temp 98.3 F (36.8 C) (Temporal)   Resp 18   Ht 5\' 6"  (1.676 m)   Wt 113 kg   LMP 11/24/2022 (Exact Date)   SpO2 99%   BMI 40.21 kg/m  Physical Exam Vitals and nursing note reviewed.  Constitutional:      General: She is not in acute distress.    Appearance: Normal appearance. She is well-developed. She is not ill-appearing.     Comments: Well appearing sitting in the room with the lights on  HENT:     Head: Normocephalic and atraumatic.     Nose: Nose normal.     Mouth/Throat:     Mouth: Mucous membranes are moist.     Pharynx: Oropharynx is clear.  Eyes:     Extraocular Movements: Extraocular movements intact.     Conjunctiva/sclera: Conjunctivae normal.     Pupils: Pupils are equal, round, and reactive to light.     Comments: Disk margins sharp, no proptosis, intact cognition   Cardiovascular:     Rate and Rhythm: Normal rate and regular rhythm.     Pulses: Normal pulses.     Heart sounds: Normal heart  sounds.  Pulmonary:     Effort: Pulmonary effort is normal. No respiratory distress.     Breath sounds: Normal breath sounds.  Abdominal:     General: Bowel sounds are normal. There is no distension.     Palpations: Abdomen is soft.     Tenderness: There is no abdominal tenderness. There is no guarding or rebound.  Genitourinary:    Vagina: No vaginal discharge.  Musculoskeletal:        General: Normal range of motion.     Cervical back: Normal range of motion and neck  supple. No rigidity.  Lymphadenopathy:     Cervical: No cervical adenopathy.  Skin:    General: Skin is warm and dry.     Capillary Refill: Capillary refill takes less than 2 seconds.     Findings: No erythema or rash.  Neurological:     General: No focal deficit present.     Mental Status: She is alert and oriented to person, place, and time.     Motor: No weakness.     Gait: Gait normal.     Deep Tendon Reflexes: Reflexes normal.     Comments: Decreased sensation left face   Psychiatric:        Mood and Affect: Mood normal.        Behavior: Behavior normal.     ED Results / Procedures / Treatments   Labs (all labs ordered are listed, but only abnormal results are displayed) Labs Reviewed  HCG, SERUM, QUALITATIVE    EKG None  Radiology CT Head Wo Contrast  Result Date: 11/24/2022 CLINICAL DATA:  Syncope/presyncope with cerebrovascular cause suspected. History of migraine and seizures. EXAM: CT HEAD WITHOUT CONTRAST TECHNIQUE: Contiguous axial images were obtained from the base of the skull through the vertex without intravenous contrast. RADIATION DOSE REDUCTION: This exam was performed according to the departmental dose-optimization program which includes automated exposure control, adjustment of the mA and/or kV according to patient size and/or use of iterative reconstruction technique. COMPARISON:  MRI brain 08/31/2019. FINDINGS: Brain: No evidence of acute infarction, hemorrhage, hydrocephalus,  extra-axial collection or mass lesion/mass effect. Vascular: No hyperdense vessel or unexpected calcification. Skull: Negative for fracture or focal lesion. Sinuses/Orbits: Interval new mild membrane thickening in the floor of the left maxillary sinus. Other sinuses are clear with hypoplastic frontal and sphenoid sinuses and clear mastoid air cells and middle ear cavities. Negative orbits. Other: None. IMPRESSION: 1. No acute intracranial CT findings or intracranial interval changes. 2. Interval new mild membrane thickening in the floor of the left maxillary sinus. Electronically Signed   By: Almira Bar M.D.   On: 11/24/2022 23:40    Procedures Procedures    Medications Ordered in ED Medications  magnesium sulfate (IV Push/IM) injection 2 g (2 g Intravenous Given 11/24/22 2333)  dexamethasone (DECADRON) injection 10 mg (10 mg Intravenous Given 11/24/22 2337)  sodium chloride 0.9 % bolus 500 mL ( Intravenous Stopped 11/25/22 0003)  droperidol (INAPSINE) 2.5 MG/ML injection 1.25 mg (1.25 mg Intravenous Given 11/24/22 2335)    ED Course/ Medical Decision Making/ A&P                                 Medical Decision Making Patient with headache that is recurrent x 1 day that was unresponsive to abortive medication.  Ongoing numbness   Amount and/or Complexity of Data Reviewed External Data Reviewed: radiology and notes.    Details: Previous ED notes and imaging reviewed  Labs: ordered.    Details: Negative pregnancy test  Radiology: ordered and independent interpretation performed.    Details: No ICH by me on CT Discussion of management or test interpretation with external provider(s): Case d/w Dr. Amada Jupiter of neurology, given new numbness in the past week MRI of the brain, along with MRV.    Risk Prescription drug management. Risk Details: Based on symptoms and discussion with neurology. MRI and MRV of the brain were ordered immediately by me and plan to transfer patient for these tests.  Patient was initially amenable to this and case was d/w EDP at [facility] and [facility] was contacted.  Informed by nurse Gaspar Garbe that patient no longer wishes to proceed with this test and is signing out against medical advice.  Patient is AO4 and has decision making capacity to refuse care.  EDP stated that we wished to help her as she continues to have new unexplained symptoms.  Risks of signing out against medical advice are but are not limited to: death, stroke, cavernous sinus thrombosis, MS or other demyelinating process.  Patient verbalizes understanding of the risks and is willing to accept these risks.  She wishes to follow up with her neurologist.  She is welcome to return at any time    Final Clinical Impression(s) / ED Diagnoses Final diagnoses:  Migraine without status migrainosus, not intractable, unspecified migraine type  Paresthesia   Patient has signed out [facility] Rx / DC Orders ED Discharge Orders     None         Kandice Schmelter, MD 11/25/22 1308

## 2022-11-25 NOTE — ED Notes (Signed)
[facility] called to cancel transport to ED to ED @ 00:55

## 2022-11-25 NOTE — ED Notes (Signed)
Patient states she is still in pain after meds. Patient states she is allergic to most NSAIDs.

## 2022-11-30 ENCOUNTER — Encounter: Payer: Self-pay | Admitting: Family Medicine

## 2022-11-30 ENCOUNTER — Ambulatory Visit: Admitting: Family Medicine

## 2022-11-30 VITALS — BP 118/80 | HR 90 | Temp 98.4°F | Ht 66.0 in | Wt 287.0 lb

## 2022-11-30 DIAGNOSIS — R202 Paresthesia of skin: Secondary | ICD-10-CM

## 2022-11-30 NOTE — Patient Instructions (Signed)
Give [place] 2-3 business days to get the results of your labs back.   Please reach out to Dr. Everlena Cooper in the meanwhile.   Let us know if you need anything.

## 2022-11-30 NOTE — Progress Notes (Signed)
Chief Complaint  Patient presents with   Numbness    On her left side Medication refills     Subjective: Patient is a 42 y.o. female here for numbness.  Patient has had numbness and sometimes tingling over the left forearm on the pinky side going up to her shoulder, left portion of her face, and left pinky toe.  It has been going on for around a week and a half.  There was no inciting or triggering event.  She went to the emergency department on 9/3 and was diagnosed with a pinched nerve in her neck.  She does not have any neck pain.  She called our office 2 days later and was told to go to the emergency department.  She received a CT scan of her head that was unremarkable.  She was then told she needs an MRI of her head but did not want to be transported by ambulance to another hospital so she signed out AMA.  Her neurological exam was reportedly unremarkable.  She is not having any weakness, difficulty swallowing, trouble with speech, vision changes, or headaches associated with this.  She did have a migraine when she was recommended to have the MRI.  Past Medical History:  Diagnosis Date   Anxiety    Asthma    Depression    GERD (gastroesophageal reflux disease)    History of chicken pox    History of migraine headaches    Migraines    Pituitary adenoma (HCC)    Seizures (HCC)     Objective: BP 118/80 (BP Location: Left Arm, Patient Position: Sitting, Cuff Size: Large)   Pulse 90   Temp 98.4 F (36.9 C) (Oral)   Ht 5\' 6"  (1.676 m)   Wt 287 lb (130.2 kg)   LMP 11/24/2022 (Exact Date)   SpO2 98%   BMI 46.32 kg/m  General: Awake, appears stated age Heart: RRR, no LE edema Lungs: CTAB, no rales, wheezes or rhonchi. No accessory muscle use Neuro: DTRs equal and symmetric throughout, no clonus, no cerebellar signs, gait is normal, decree sensation to touch over the left periorbital region, ulnar distribution of the left upper extremity, and left pinky toe.  Negative Spurling's,  Tinel's over the ulnar canal at the elbow. Psych: Age appropriate judgment and insight, normal affect and mood  Assessment and Plan: Paresthesias - Plan: CBC, Comprehensive metabolic panel, TSH, Magnesium, B12  Check above labs to complete the workup.  She has an appointment with her neurologist in the next few weeks, I encouraged her to reach out to him sooner to see if she should be seen before then.  With a negative head CT scan and resolution of headache, I do not think this is urgent or emergent at this time.  Exam is unremarkable today other than decreased sensation. The patient voiced understanding and agreement to the plan.  I spent 30 minutes with the patient discussing the above plan in addition to reviewing her chart on the same day of the visit.  Jilda Roche [place], DO 11/30/22  4:39 PM

## 2022-12-01 LAB — CBC
HCT: 37.7 % (ref 36.0–46.0)
Hemoglobin: 11.8 g/dL — ABNORMAL LOW (ref 12.0–15.0)
MCHC: 31.4 g/dL (ref 30.0–36.0)
MCV: 73.2 fl — ABNORMAL LOW (ref 78.0–100.0)
Platelets: 477 10*3/uL — ABNORMAL HIGH (ref 150.0–400.0)
RBC: 5.16 Mil/uL — ABNORMAL HIGH (ref 3.87–5.11)
RDW: 17.5 % — ABNORMAL HIGH (ref 11.5–15.5)
WBC: 9.6 10*3/uL (ref 4.0–10.5)

## 2022-12-01 LAB — COMPREHENSIVE METABOLIC PANEL
ALT: 12 U/L (ref 0–35)
AST: 14 U/L (ref 0–37)
Albumin: 3.9 g/dL (ref 3.5–5.2)
Alkaline Phosphatase: 81 U/L (ref 39–117)
BUN: 9 mg/dL (ref 6–23)
CO2: 23 meq/L (ref 19–32)
Calcium: 8.8 mg/dL (ref 8.4–10.5)
Chloride: 98 meq/L (ref 96–112)
Creatinine, Ser: 0.87 mg/dL (ref 0.40–1.20)
GFR: 81.92 mL/min (ref >60.00)
Glucose, Bld: 349 mg/dL — ABNORMAL HIGH (ref 70–99)
Potassium: 4.3 meq/L (ref 3.5–5.1)
Sodium: 132 meq/L — ABNORMAL LOW (ref 135–145)
Total Bilirubin: 0.4 mg/dL (ref 0.2–1.2)
Total Protein: 7.1 g/dL (ref 6.0–8.3)

## 2022-12-01 LAB — MAGNESIUM: Magnesium: 1.7 mg/dL (ref 1.5–2.5)

## 2022-12-01 LAB — TSH: TSH: 0.48 u[IU]/mL (ref 0.35–5.50)

## 2022-12-01 LAB — VITAMIN B12: Vitamin B-12: 243 pg/mL (ref 211–911)

## 2022-12-02 ENCOUNTER — Ambulatory Visit (INDEPENDENT_AMBULATORY_CARE_PROVIDER_SITE_OTHER)

## 2022-12-02 ENCOUNTER — Other Ambulatory Visit: Payer: Self-pay | Admitting: *Deleted

## 2022-12-02 DIAGNOSIS — D649 Anemia, unspecified: Secondary | ICD-10-CM | POA: Diagnosis not present

## 2022-12-02 DIAGNOSIS — R739 Hyperglycemia, unspecified: Secondary | ICD-10-CM | POA: Diagnosis not present

## 2022-12-02 LAB — HEMOGLOBIN A1C: Hgb A1c MFr Bld: 11.6 % — ABNORMAL HIGH (ref 4.6–6.5)

## 2022-12-02 LAB — IBC + FERRITIN
Ferritin: 5.1 ng/mL — ABNORMAL LOW (ref 10.0–291.0)
Iron: 26 ug/dL — ABNORMAL LOW (ref 42–145)
Saturation Ratios: 5.2 % — ABNORMAL LOW (ref 20.0–50.0)
TIBC: 497 ug/dL — ABNORMAL HIGH (ref 250.0–450.0)
Transferrin: 355 mg/dL (ref 212.0–360.0)

## 2022-12-02 NOTE — Progress Notes (Signed)
[id number]

## 2022-12-09 ENCOUNTER — Ambulatory Visit: Admitting: Family Medicine

## 2022-12-09 ENCOUNTER — Encounter: Payer: Self-pay | Admitting: Family Medicine

## 2022-12-12 ENCOUNTER — Other Ambulatory Visit: Payer: Self-pay | Admitting: Family Medicine

## 2022-12-12 ENCOUNTER — Encounter: Payer: Self-pay | Admitting: Family Medicine

## 2022-12-12 DIAGNOSIS — D509 Iron deficiency anemia, unspecified: Secondary | ICD-10-CM

## 2022-12-22 NOTE — Progress Notes (Deleted)
NEUROLOGY FOLLOW UP OFFICE NOTE  Amber [name] [id number]  Assessment/Plan:    1.  Hemiplegic migraine, intractable 2.  Cluster headache, infrequent 3.  Prolactinoma    1.  Migraine prevention:  Emgality.  To further reduce frequency, we will restart topiramate - take 25mg  at bedtime for one week, then 50mg  at bedtime.  We can increase dose in 6 weeks if needed 3.  Migraine Rescue:  Nurtec first line, tramadol second line; Zofran for nausea.  For cluster headaches, tramadol. 4.  Limit use of pain relievers to no more than 2 days out of week to prevent risk of rebound or medication-overuse headache. 5.  Keep headache diary 6.  Follow up in 6 months.  Subjective:  Amber [name] is a 42 year old female with prolactinoma and anxiety with depression who follows up for migraine.   UPDATE: Restarted topiramate in April.  ***  Migraines: *** Migraines were severe month of August and early September.  She was seen in the ED three times for intractable migraine between 8/14 and 9/5.  ***.  On 9/3, she was seen in the ED for numbness involving the 4th and fifth fingers of her left hand, fifth digits of both feet and left side of face.  CT head personally reviewed was negative.     Migraines:  In March, she had 8 migraine days.  Usually lasts 2-3 hours with Nurtec.  Nurtec not always effective.  Since last visit, had to go to ED twice.   Cluster headache:  2 cluster headaches in March.    Rescue protocol:  Nurtec, tramadol, Zofran 8mg  Frequency of abortive: 2 to 3 days a week Rescue protocol:  Zofran, Nurtec (tramadol second line) Current NSAIDS:  no Current analgesics:  tramadol Current triptans:  no Current ergot:  Dostinex (for hyperprolactinemia) Current anti-emetic:  Zofran 8mg  Current muscle relaxants:  tizanidine 4mg  PRN muscle spams Current anti-anxiolytic:  hydroxyzine Current sleep aide:  quetiapine 50mg  Current Antihypertensive medications:  no Current  Antidepressant medications:  Lexapro 10mg  Current Anticonvulsant medications:  none Current CGRP inhibitor:  Emgality, Nurtec (rescue) Current Vitamins/Herbal/Supplements:  magnesium oxide 400mg , riboflavin 400mg  Current Antihistamines/Decongestants:  Benadryl Other therapy:  no   Caffeine:  1 cup coffee rarely Alcohol:  no Smoker:  no Diet:  60 oz water daily.  Little fast food.  No soda.  Eats chicken and fish.  Not skipping meals.   Exercise:  no Depression/anxiety:  yes Other pain: none Sleep hygiene:  Improved with quetiapine   Prolactin level still elevated, however  Workup is ongoing.  She is undergoing cardiac evaluation as well.   HISTORY: Migraines Onset:  Childhood Location:  Left sided Quality:  throbbing Initial intensity:  severe Aura:  no Prodrome:  Feels ill for 3 days prior Postdrome:  "hangover" effect for 1 day after Associated symptoms:  Left sided numbness of face, arm and leg with slight weakness of left arm and leg.  Nausea, photophobia, and phonophobia.  She has not had any new worse headache of her life, waking up from sleep Initial Duration:  Several hours to several days.  Over the summer, she had status migrainosis lasting 13-14 weeks. Initial Frequency:  3 to 4 days a week Initial Frequency of abortive medication: 3 to 4 days a week Triggers/aggravating factors:  Sleep deprivation, shifting positions Relieving factors:  Laying down in dark and quiet room Activity:  Aggravates.   Past NSAIDS:  Ibuprofen.  Cannot take NSAIDs due to GI bleed Past analgesics:  Excedrin Past abortive triptans/ergot:  Sumatriptan (increased headache and caused joint pain).  CONTRAINDICATED (Hemiplegic migraine) Past muscle relaxants:  no Past anti-emetic:  Reglan (contraindicated with cabergoline), Zofran 4mg , Promethazine Past antihypertensive medications:  Propranolol 80mg  (hypotension) Past antidepressant medications:  Prozac; venlafaxine XR 150mg  daily Past  anticonvulsant medications:  topiramate 100mg  BID (stopped as trying to get pregnant) Past CGRP inhibitors:  Bernita Raisin Past vitamins/Herbal/Supplements:  no Past antihistamines/decongestants:  no Other past therapies:  Reyvow Unable to afford Botox with her insurance plan.    Cluster Headache: Right orbital, stabbing, severe, associated with ptosis and conjunctival injection, lasting several hours and occurring 1 to 2 times a month.   Family history of headache:  mom   MRI of brain with and without contrast from 05/25/16 was personally reviewed and revealed 6 x 9 mm hypoenhancing pituitary microadenoma without compression of the optic chiasm. MRI of pituitary with and without contrast from 08/31/2019 showed regression of the microadenoma since 2018.   PAST MEDICAL HISTORY: Past Medical History:  Diagnosis Date   Anxiety    Asthma    Depression    GERD (gastroesophageal reflux disease)    History of chicken pox    History of migraine headaches    Migraines    Pituitary adenoma (HCC)    Seizures (HCC)     MEDICATIONS: Current Outpatient Medications on File Prior to Visit  Medication Sig Dispense Refill   acetaminophen (TYLENOL) 500 MG tablet Take 500 mg by mouth as needed.     cabergoline (DOSTINEX) 0.5 MG tablet 2.5 mg weekly (Two tabs on Mondays, 1 tab on wednesdays and 2 tabs on Fridays) 65 tablet 0   escitalopram (LEXAPRO) 10 MG tablet TAKE ONE TABLET BY MOUTH EVERY DAY 90 tablet 3   escitalopram (LEXAPRO) 10 MG tablet Take 1 tablet (10 mg total) by mouth daily. 30 tablet 0   famotidine (PEPCID) 20 MG tablet Take 20 mg by mouth 2 (two) times daily.     famotidine (PEPCID) 20 MG tablet Take 1 tablet (20 mg total) by mouth 2 (two) times daily. 10 tablet 0   fenofibrate (TRICOR) 145 MG tablet Take 1 tablet (145 mg total) by mouth daily. 90 tablet 3   Galcanezumab-gnlm (EMGALITY) 120 MG/ML SOAJ Inject 120 mg into the skin every 28 (twenty-eight) days. 1.12 mL 11   hydrOXYzine  (ATARAX/VISTARIL) 25 MG tablet TAKE 1-3 TABLETS (25-75 MG TOTAL) BY MOUTH 3 (THREE) TIMES DAILY AS NEEDED FOR ANXIETY. 270 tablet 1   metFORMIN (GLUCOPHAGE) 500 MG tablet Take 1 tablet (500 mg total) by mouth 2 (two) times daily with a meal. 180 tablet 3   ondansetron (ZOFRAN) 8 MG tablet Take 1 tablet (8 mg total) by mouth as needed for nausea or vomiting. 20 tablet 2   pantoprazole (PROTONIX) 40 MG tablet Take 1 tablet (40 mg total) by mouth 2 (two) times daily. 180 tablet 3   predniSONE (DELTASONE) 20 MG tablet 2 tabs po daily x 4 days 8 tablet 0   QUEtiapine (SEROQUEL) 300 MG tablet Take 1 tablet (300 mg total) by mouth at bedtime. 90 tablet 3   Riboflavin 400 MG CAPS Take 400 mg by mouth daily.     Rimegepant Sulfate (NURTEC) 75 MG TBDP Take 1 tablet (75 mg total) by mouth as needed (Daily as needed for a Migraine). Maximum 1 tablet in 24 hours.  Quantity 8. 8 tablet 11   rosuvastatin (CRESTOR) 20 MG tablet Take 1 tablet (20 mg total) by mouth daily.  90 tablet 3   tiZANidine (ZANAFLEX) 4 MG tablet Take 1 tablet (4 mg total) by mouth every 6 (six) hours as needed for muscle spasms. 30 tablet 0   topiramate (TOPAMAX) 50 MG tablet Take 1 tablet (50 mg total) by mouth at bedtime. 30 tablet 5   traMADol (ULTRAM) 50 MG tablet TAKE ONE TABLET BY MOUTH EVERY 6 HOURS AS NEEDED 30 tablet 2   Vitamin D, Ergocalciferol, (DRISDOL) 1.25 MG (50000 UNIT) CAPS capsule Take 1 capsule (50,000 Units total) by mouth every 7 (seven) days. 12 capsule 0   No current facility-administered medications on file prior to visit.    ALLERGIES: Allergies  Allergen Reactions   Aspirin    Beeswax    Chlorhexidine    Ibuprofen    Iodine    Lunesta [Eszopiclone] Other (See Comments)    GI issues   Nsaids Nausea And Vomiting   Shellfish Allergy     Tingle in mouth/throat   Sulfa Antibiotics    Triptans Other (See Comments)    Makes pain worse   Tape Rash    Blisters     FAMILY HISTORY: Family History   Problem Relation Age of Onset   Diabetes Mother    Migraines Mother    Hyperlipidemia Mother    Bipolar disorder Mother    Irritable bowel syndrome Mother    Diabetes Father    Colon cancer Father    Cancer Father        colon   Hyperlipidemia Father    Hypertension Father    Bipolar disorder Brother    Migraines Sister    Other Neg Hx        pituitary disorder   Esophageal cancer Neg Hx       Objective:  *** General: No acute distress.  Patient appears ***-groomed.   Head:  Normocephalic/atraumatic Eyes:  Fundi examined but not visualized Neck: supple, no paraspinal tenderness, full range of motion Heart:  Regular rate and rhythm Lungs:  Clear to auscultation bilaterally Back: No paraspinal tenderness Neurological Exam: alert and oriented.  Speech fluent and not dysarthric, language intact.  CN II-XII intact. Bulk and tone normal, muscle strength 5/5 throughout.  Sensation to light touch intact.  Deep tendon reflexes 2+ throughout, toes downgoing.  Finger to nose testing intact.  Gait normal, Romberg negative.   Shon Millet, DO  CC: ***

## 2022-12-23 ENCOUNTER — Other Ambulatory Visit: Payer: Self-pay | Admitting: Neurology

## 2022-12-23 ENCOUNTER — Ambulatory Visit: Admitting: Neurology

## 2022-12-23 ENCOUNTER — Encounter: Payer: Self-pay | Admitting: Neurology

## 2022-12-23 DIAGNOSIS — Z029 Encounter for administrative examinations, unspecified: Secondary | ICD-10-CM

## 2022-12-24 ENCOUNTER — Other Ambulatory Visit: Payer: Self-pay

## 2022-12-24 ENCOUNTER — Emergency Department (HOSPITAL_BASED_OUTPATIENT_CLINIC_OR_DEPARTMENT_OTHER)
Admission: EM | Admit: 2022-12-24 | Discharge: 2022-12-25 | Disposition: A | Discharge status: Home / Self Care | Attending: Emergency Medicine | Admitting: Emergency Medicine

## 2022-12-24 ENCOUNTER — Encounter (HOSPITAL_BASED_OUTPATIENT_CLINIC_OR_DEPARTMENT_OTHER): Payer: Self-pay | Admitting: Emergency Medicine

## 2022-12-24 DIAGNOSIS — Z7984 Long term (current) use of oral hypoglycemic drugs: Secondary | ICD-10-CM | POA: Diagnosis not present

## 2022-12-24 DIAGNOSIS — R739 Hyperglycemia, unspecified: Secondary | ICD-10-CM | POA: Diagnosis present

## 2022-12-24 DIAGNOSIS — E1165 Type 2 diabetes mellitus with hyperglycemia: Secondary | ICD-10-CM | POA: Diagnosis not present

## 2022-12-24 LAB — CBC WITH DIFFERENTIAL/PLATELET
Abs Immature Granulocytes: 0.04 10*3/uL (ref 0.00–0.07)
Basophils Absolute: 0 10*3/uL (ref 0.0–0.1)
Basophils Relative: 0 %
Eosinophils Absolute: 0.2 10*3/uL (ref 0.0–0.5)
Eosinophils Relative: 2 %
HCT: 36.7 % (ref 36.0–46.0)
Hemoglobin: 11.6 g/dL — ABNORMAL LOW (ref 12.0–15.0)
Immature Granulocytes: 0 %
Lymphocytes Relative: 28 %
Lymphs Abs: 3.2 10*3/uL (ref 0.7–4.0)
MCH: 23.2 pg — ABNORMAL LOW (ref 26.0–34.0)
MCHC: 31.6 g/dL (ref 30.0–36.0)
MCV: 73.4 fL — ABNORMAL LOW (ref 80.0–100.0)
Monocytes Absolute: 0.8 10*3/uL (ref 0.1–1.0)
Monocytes Relative: 7 %
Neutro Abs: 7.1 10*3/uL (ref 1.7–7.7)
Neutrophils Relative %: 63 %
Platelets: 424 10*3/uL — ABNORMAL HIGH (ref 150–400)
RBC: 5 MIL/uL (ref 3.87–5.11)
RDW: 16.3 % — ABNORMAL HIGH (ref 11.5–15.5)
WBC: 11.4 10*3/uL — ABNORMAL HIGH (ref 4.0–10.5)
nRBC: 0 % (ref 0.0–0.2)

## 2022-12-24 LAB — BASIC METABOLIC PANEL
Anion gap: 11 (ref 5–15)
BUN: 10 mg/dL (ref 6–20)
CO2: 22 mmol/L (ref 22–32)
Calcium: 8.9 mg/dL (ref 8.9–10.3)
Chloride: 101 mmol/L (ref 98–111)
Creatinine, Ser: 1.19 mg/dL — ABNORMAL HIGH (ref 0.44–1.00)
GFR, Estimated: 59 mL/min — ABNORMAL LOW (ref >60)
Glucose, Bld: 324 mg/dL — ABNORMAL HIGH (ref 70–99)
Potassium: 3.7 mmol/L (ref 3.5–5.1)
Sodium: 134 mmol/L — ABNORMAL LOW (ref 135–145)

## 2022-12-24 LAB — URINALYSIS, MICROSCOPIC (REFLEX)

## 2022-12-24 LAB — URINALYSIS, ROUTINE W REFLEX MICROSCOPIC
Bilirubin Urine: NEGATIVE
Glucose, UA: >=500 mg/dL — AB
Hgb urine dipstick: NEGATIVE
Ketones, ur: NEGATIVE mg/dL
Leukocytes,Ua: NEGATIVE
Nitrite: NEGATIVE
Protein, ur: NEGATIVE mg/dL
Specific Gravity, Urine: 1.015 (ref 1.005–1.030)
pH: 7.5 (ref 5.0–8.0)

## 2022-12-24 LAB — CBG MONITORING, ED: Glucose-Capillary: 332 mg/dL — ABNORMAL HIGH (ref 70–99)

## 2022-12-24 LAB — PREGNANCY, URINE: Preg Test, Ur: NEGATIVE

## 2022-12-24 MED ORDER — METFORMIN HCL ER (MOD) 1000 MG PO TB24: 1000.0000 mg | ORAL_TABLET | Freq: Every day | ORAL | 0 refills | Status: DC

## 2022-12-24 MED ORDER — SODIUM CHLORIDE 0.9 % IV BOLUS
1000.0000 mL | Freq: Once | INTRAVENOUS | Status: AC
  Administered 2022-12-24: 1000 mL via INTRAVENOUS

## 2022-12-24 NOTE — ED Triage Notes (Signed)
Pt reports glucose was >400 at home; newly diagnosed in Sept, not on meds yet because she missed her appt; also has migraine, has 15-20 per month

## 2022-12-24 NOTE — ED Notes (Signed)
Pt placed on the monitor with the 5 lead, BP cuff and pulse ox

## 2022-12-24 NOTE — ED Provider Notes (Signed)
EMERGENCY DEPARTMENT AT [facility] Provider Note   CSN: [id number] Arrival date & time: 12/24/22  2157     History {Add pertinent medical, surgical, social history, OB history to HPI:1} Chief Complaint  Patient presents with   Hyperglycemia    Amber [name] is a 42 y.o. female.  42 yo F with a chief complaints of hyperglycemia.  The patient had recently been seen by her primary care provider and was newly diagnosed with diabetes.  She unfortunately has not made it for a repeat appointment since then has not been started on a new medications.  She had been feeling kind of lousy for the past few days fatigued increased urinary efforts and thirsty all the time.  She checked her blood sugar this morning and realized it was elevated and after some discussion with her husband they called the nursing hotline who encouraged her to come to the ED for evaluation.  She has had a bit of a migraine which is not uncommon for her.  She denies any cough congestion or fever denies abdominal pain nausea vomiting or diarrhea.  Denies any wounds or sores.   Hyperglycemia      Home Medications Prior to Admission medications   Medication Sig Start Date End Date Taking? Authorizing Provider  acetaminophen (TYLENOL) 500 MG tablet Take 500 mg by mouth as needed.    [provider]  cabergoline (DOSTINEX) 0.5 MG tablet 2.5 mg weekly (Two tabs on Mondays, 1 tab on wednesdays and 2 tabs on Fridays) 08/23/22   Shamleffer, Konrad Dolores, MD  escitalopram (LEXAPRO) 10 MG tablet TAKE ONE TABLET BY MOUTH EVERY DAY 09/05/22   Carmelia Roller, Jilda Roche, DO  escitalopram (LEXAPRO) 10 MG tablet Take 1 tablet (10 mg total) by mouth daily. 09/05/22   Sharlene Dory, DO  famotidine (PEPCID) 20 MG tablet Take 20 mg by mouth 2 (two) times daily.    [provider]  famotidine (PEPCID) 20 MG tablet Take 1 tablet (20 mg total) by mouth 2 (two) times daily. 11/06/21   Palumbo,  April, MD  fenofibrate (TRICOR) 145 MG tablet Take 1 tablet (145 mg total) by mouth daily. 09/01/22   Wendling, Jilda Roche, DO  Galcanezumab-gnlm (EMGALITY) 120 MG/ML SOAJ Inject 120 mg into the skin every 28 (twenty-eight) days. 06/22/22   Everlena Cooper, Adam R, DO  hydrOXYzine (ATARAX/VISTARIL) 25 MG tablet TAKE 1-3 TABLETS (25-75 MG TOTAL) BY MOUTH 3 (THREE) TIMES DAILY AS NEEDED FOR ANXIETY. 01/22/18   Sharlene Dory, DO  metFORMIN (GLUCOPHAGE) 500 MG tablet Take 1 tablet (500 mg total) by mouth 2 (two) times daily with a meal. 09/01/22   Wendling, Jilda Roche, DO  ondansetron (ZOFRAN) 8 MG tablet Take 1 tablet (8 mg total) by mouth as needed for nausea or vomiting. 06/28/21   Everlena Cooper, Adam R, DO  pantoprazole (PROTONIX) 40 MG tablet Take 1 tablet (40 mg total) by mouth 2 (two) times daily. 09/01/22   Sharlene Dory, DO  predniSONE (DELTASONE) 20 MG tablet 2 tabs po daily x 4 days 11/06/21   Palumbo, April, MD  QUEtiapine (SEROQUEL) 300 MG tablet Take 1 tablet (300 mg total) by mouth at bedtime. 09/01/22   Sharlene Dory, DO  Riboflavin 400 MG CAPS Take 400 mg by mouth daily.    [provider]  Rimegepant Sulfate (NURTEC) 75 MG TBDP Take 1 tablet (75 mg total) by mouth as needed (Daily as needed for a Migraine). Maximum 1 tablet in 24 hours.  Quantity 8. 06/22/22   Everlena Cooper, Adam R, DO  rosuvastatin (CRESTOR) 20 MG tablet Take 1 tablet (20 mg total) by mouth daily. 09/01/22   Sharlene Dory, DO  tiZANidine (ZANAFLEX) 4 MG tablet Take 1 tablet (4 mg total) by mouth every 6 (six) hours as needed for muscle spasms. 11/24/21   Sharlene Dory, DO  topiramate (TOPAMAX) 50 MG tablet Take 1 tablet (50 mg total) by mouth at bedtime. 06/22/22   Drema Dallas, DO  traMADol (ULTRAM) 50 MG tablet TAKE ONE TABLET BY MOUTH EVERY 6 HOURS AS NEEDED 12/23/22   Drema Dallas, DO  Vitamin D, Ergocalciferol, (DRISDOL) 1.25 MG (50000 UNIT) CAPS capsule Take 1 capsule (50,000 Units total)  by mouth every 7 (seven) days. 11/25/21   Sharlene Dory, DO      Allergies    Aspirin, Beeswax, Chlorhexidine, Ibuprofen, Iodine, Lunesta [eszopiclone], Nsaids, Shellfish allergy, Sulfa antibiotics, Triptans, and Tape    Review of Systems   Review of Systems  Physical Exam Updated Vital Signs BP 115/76   Pulse (!) 112   Temp 98.9 F (37.2 C) (Oral)   Resp 16   Ht 5\' 6"  (1.676 m)   Wt 130.2 kg   LMP 11/24/2022 (Exact Date)   SpO2 98%   BMI 46.33 kg/m  Physical Exam Vitals and nursing note reviewed.  Constitutional:      General: She is not in acute distress.    Appearance: She is well-developed. She is not diaphoretic.     Comments: BMI 46  HENT:     Head: Normocephalic and atraumatic.  Eyes:     Pupils: Pupils are equal, round, and reactive to light.  Cardiovascular:     Rate and Rhythm: Normal rate and regular rhythm.     Heart sounds: No murmur heard.    No friction rub. No gallop.  Pulmonary:     Effort: Pulmonary effort is normal.     Breath sounds: No wheezing or rales.  Abdominal:     General: There is no distension.     Palpations: Abdomen is soft.     Tenderness: There is no abdominal tenderness.  Musculoskeletal:        General: No tenderness.     Cervical back: Normal range of motion and neck supple.  Skin:    General: Skin is warm and dry.  Neurological:     Mental Status: She is alert and oriented to person, place, and time.  Psychiatric:        Behavior: Behavior normal.     ED Results / Procedures / Treatments   Labs (all labs ordered are listed, but only abnormal results are displayed) Labs Reviewed  CBG MONITORING, ED - Abnormal; Notable for the following components:      Result Value   Glucose-Capillary 332 (*)    All other components within normal limits  BASIC METABOLIC PANEL  CBC WITH DIFFERENTIAL/PLATELET  URINALYSIS, ROUTINE W REFLEX MICROSCOPIC  PREGNANCY, URINE    EKG None  Radiology No results  found.  Procedures Procedures  {Document cardiac monitor, telemetry assessment procedure when appropriate:1}  Medications Ordered in ED Medications  sodium chloride 0.9 % bolus 1,000 mL (1,000 mLs Intravenous New Bag/Given 12/24/22 2302)    ED Course/ Medical Decision Making/ A&P   {   Click here for ABCD2, HEART and other calculatorsREFRESH Note before signing :1}  Medical Decision Making  42 yo F with a chief complaints of high blood sugar.  Patient was recently diagnosed with diabetes.  She is on metformin for a pituitary tumor.  She denies any other diabetes medications.  Blood sugar here 332.  Will screen for diabetic ketoacidosis.  IV fluids.  Reassess.  {Document critical care time when appropriate:1} {Document review of labs and clinical decision tools ie heart score, Chads2Vasc2 etc:1}  {Document your independent review of radiology images, and any outside records:1} {Document your discussion with family members, caretakers, and with consultants:1} {Document social determinants of health affecting pt's care:1} {Document your decision making why or why not admission, treatments were needed:1} Final Clinical Impression(s) / ED Diagnoses Final diagnoses:  None    Rx / DC Orders ED Discharge Orders     None

## 2022-12-24 NOTE — Discharge Instructions (Signed)
Your blood sugar was elevated here.  I have increased your dose of metformin.  Please call your family doctor first thing on Monday and let them know that you were seen here.  They need to likely add medications to your regimen.

## 2022-12-26 ENCOUNTER — Telehealth: Payer: Self-pay

## 2022-12-26 NOTE — Telephone Encounter (Signed)
Initial Comment Caller states her blood sugar is 405 and she is dizzy, lethargic, and thirsty. Pt is also urinating a lot. Translation No Nurse Assessment Nurse: Rennis Petty, RN, Clydie Braun Date/Time Lamount Cohen Time): 12/24/2022 9:19:32 PM Confirm and document reason for call. If symptomatic, describe symptoms. ---Caller states her BS is 405. She takes Metformin. States she feels dizzy, lethargic and thirsty, urinating a lot. Denies vomiting. Does not have any ketone strips. Recently diagnosed with DM. Does the patient have any new or worsening symptoms? ---Yes Will a triage be completed? ---Yes Related visit to physician within the last 2 weeks? ---No Does the PT have any chronic conditions? (i.e. diabetes, asthma, this includes High risk factors for pregnancy, etc.) ---Yes List chronic conditions. ---DM, migraines, pituitary tumor, GERD, OCD Is the patient pregnant or possibly pregnant? (Ask all females between the ages of 74-55) ---No Is this a behavioral health or substance abuse call? ---No Guidelines Guideline Title Affirmed Question Affirmed Notes Nurse Date/Time (Eastern Time) Dizziness - Lightheadedness SEVERE dizziness (e.g., unable to stand, requires support to walk, feels like passing out now) Rennis Petty, Charity fundraiser, Clydie Braun 12/24/2022 9:23:19 PM PLEASE NOTE: All timestamps contained within this report are represented as [place] Standard Time. CONFIDENTIALTY NOTICE: This fax transmission is intended only for the addressee. It contains information that is legally privileged, confidential or otherwise protected from use or disclosure. If you are not the intended recipient, you are strictly prohibited from reviewing, disclosing, copying using or disseminating any of this information or taking any action in reliance on or regarding this information. If you have received this fax in error, please notify us immediately by telephone so that we can arrange for its return to [place]. Phone: [phone],  Toll-Free: [phone], Fax: [phone] Page: 2 of 2 Call Id: [id number] Disp. Time Lamount Cohen Time) Disposition Final User 12/24/2022 9:27:20 PM Go to ED Now (or PCP triage) Yes Rennis Petty, RN, Clydie Braun Final Disposition 12/24/2022 9:27:20 PM Go to ED Now (or PCP triage) Yes Rennis Petty, RN, Pablo Ledger Disagree/Comply Comply Caller Understands Yes PreDisposition InappropriateToAsk Care Advice Given Per Guideline GO TO ED/UCC NOW (OR PCP TRIAGE): * IF NO PCP (PRIMARY CARE PROVIDER) SECOND-LEVEL TRIAGE: You need to be seen within the next hour. Go to the ED/UCC at _____________ Hospital. Leave as soon as you can. ANOTHER ADULT SHOULD DRIVE: * It is better and safer if another adult drives instead of you. CARE ADVICE given per Dizziness - Lightheadedness (Adult) guideline. Comments User: Loletta Specter, RN Date/Time Lamount Cohen Time): 12/24/2022 9:26:21 PM BP 108/82, pulse 103. User: Loletta Specter, RN Date/Time Lamount Cohen Time): 12/24/2022 9:28:57 PM Recommended ER to pt, not UC because of her BS. Referrals [facility] - ED

## 2022-12-26 NOTE — Telephone Encounter (Signed)
Called and let her know the script was filled but she would need a follow up scheduled. She was okay with that and I sent a note to front to call her.

## 2022-12-26 NOTE — Telephone Encounter (Signed)
Pt seen in ED 

## 2022-12-28 ENCOUNTER — Ambulatory Visit (INDEPENDENT_AMBULATORY_CARE_PROVIDER_SITE_OTHER): Admitting: Family Medicine

## 2022-12-28 ENCOUNTER — Encounter: Payer: Self-pay | Admitting: Family Medicine

## 2022-12-28 VITALS — BP 120/80 | HR 112 | Temp 98.4°F | Ht 66.0 in | Wt 284.5 lb

## 2022-12-28 DIAGNOSIS — Z23 Encounter for immunization: Secondary | ICD-10-CM | POA: Diagnosis not present

## 2022-12-28 DIAGNOSIS — B353 Tinea pedis: Secondary | ICD-10-CM | POA: Diagnosis not present

## 2022-12-28 DIAGNOSIS — E1165 Type 2 diabetes mellitus with hyperglycemia: Secondary | ICD-10-CM | POA: Insufficient documentation

## 2022-12-28 DIAGNOSIS — G5622 Lesion of ulnar nerve, left upper limb: Secondary | ICD-10-CM

## 2022-12-28 DIAGNOSIS — Z7985 Long-term (current) use of injectable non-insulin antidiabetic drugs: Secondary | ICD-10-CM

## 2022-12-28 MED ORDER — TIRZEPATIDE 5 MG/0.5ML ~~LOC~~ SOPN
5.0000 mg | PEN_INJECTOR | SUBCUTANEOUS | 0 refills | Status: AC
Start: 2023-01-25 — End: 2023-02-22

## 2022-12-28 MED ORDER — METFORMIN HCL ER (MOD) 1000 MG PO TB24: 1000.0000 mg | ORAL_TABLET | Freq: Every day | ORAL | 1 refills | Status: DC

## 2022-12-28 MED ORDER — KETOCONAZOLE 2 % EX CREA: 1.0000 | TOPICAL_CREAM | Freq: Every day | CUTANEOUS | 0 refills | Status: AC

## 2022-12-28 MED ORDER — TIRZEPATIDE 2.5 MG/0.5ML ~~LOC~~ SOPN
2.5000 mg | PEN_INJECTOR | SUBCUTANEOUS | 0 refills | Status: AC
Start: 2022-12-28 — End: 2023-01-25

## 2022-12-28 MED ORDER — TIRZEPATIDE 7.5 MG/0.5ML ~~LOC~~ SOAJ
7.5000 mg | SUBCUTANEOUS | 0 refills | Status: AC
Start: 2023-02-22 — End: 2023-03-22

## 2022-12-28 MED ORDER — DEXCOM G6 SENSOR MISC: 1 refills | Status: DC

## 2022-12-28 NOTE — Progress Notes (Signed)
Chief Complaint  Patient presents with   Hyperglycemia    Subjective: Patient is a 42 y.o. female here for f/u.  Dx'd w diabetes, A1c was 12.8. sugars running in 300-400's. Diet is improving, portions are down. Walking for exercise. Has never had PCV20. On Crestor 20 mg/d. Compliant, no AE's. Taking metformin 1000 mg bid. Compliant, no AE's. Does not have eye doc.   Patient has now had around 6 weeks of numbness/tingling around her left forearm/pinky.  Lab workup was unremarkable.  She had a head scan that was unremarkable.  It is not any better since the last time she was seen.  Past Medical History:  Diagnosis Date   Anxiety    Asthma    Depression    GERD (gastroesophageal reflux disease)    History of chicken pox    History of migraine headaches    Migraines    Pituitary adenoma (HCC)    Seizures (HCC)     Objective: BP 120/80 (BP Location: Left Arm, Patient Position: Sitting, Cuff Size: Large)   Pulse (!) 112   Temp 98.4 F (36.9 C) (Oral)   Ht 5\' 6"  (1.676 m)   Wt 284 lb 8 oz (129 kg)   SpO2 97%   BMI 45.92 kg/m  General: Awake, appears stated age Heart: DP pulse 2+ b/l Skin: Macerated tissue between 3/4, 4/5 digits on the right and 4/5 digits on the left.  No wounds or callus formations. Neuro: Sensation intact to pinprick b/l Lungs: No accessory muscle use Psych: Age appropriate judgment and insight, normal affect and mood  Assessment and Plan: Type 2 diabetes mellitus with hyperglycemia, without long-term current use of insulin (HCC) - Plan: Microalbumin / creatinine urine ratio, Anti-islet cell antibody, Ambulatory referral to Ophthalmology, tirzepatide [facility]) 2.5 MG/0.5ML Pen, tirzepatide (MOUNJARO) 5 MG/0.5ML Pen, tirzepatide (MOUNJARO) 7.5 MG/0.5ML Pen  Ulnar neuropathy at elbow of left upper extremity - Plan: Ambulatory referral to Hand Surgery  Tinea pedis of both feet  Chronic, unstable. Cont metformin 1000 mg bid. Start Mounjaro 2.5 mg/week and  uptitrate. Monitor sugars. Ck urine and islet cell ab.  Follow-up in 6 weeks. Refer to hand surgery as this is not improving. 6 weeks of ketoconazole cream. The patient voiced understanding and agreement to the plan.  Jilda Roche [place], DO 12/28/22  2:51 PM

## 2022-12-28 NOTE — Patient Instructions (Signed)
Stay on the metformin.   Continue to monitor your sugars.   Keep the diet clean and stay active.  Give [place] 4-5 business days to get the results of your labs back.   Let us know if you need anything.

## 2022-12-28 NOTE — Addendum Note (Signed)
Addended by: Scharlene Gloss B on: 12/28/2022 03:58 PM   Modules accepted: Orders

## 2022-12-29 ENCOUNTER — Telehealth: Payer: Self-pay | Admitting: Internal Medicine

## 2022-12-29 ENCOUNTER — Telehealth: Payer: Self-pay | Admitting: Family Medicine

## 2022-12-29 ENCOUNTER — Encounter: Payer: Self-pay | Admitting: Family Medicine

## 2022-12-29 LAB — MICROALBUMIN / CREATININE URINE RATIO
Creatinine,U: 187.1 mg/dL
Microalb Creat Ratio: 10.1 mg/g (ref 0.0–30.0)
Microalb, Ur: 18.9 mg/dL — ABNORMAL HIGH (ref 0.0–1.9)

## 2022-12-29 LAB — ANTI-ISLET CELL ANTIBODY: Islet Cell Ab: NEGATIVE

## 2022-12-29 NOTE — Telephone Encounter (Signed)
Issues with A1C being high and now having issues with headache. Please call patient

## 2022-12-29 NOTE — Telephone Encounter (Signed)
Pt called and stated that she needs her prescription for Continuous Glucose Sensor (DEXCOM G6 SENSOR) MISC to be changed to Dexcom G7. Please advise pt.

## 2022-12-30 ENCOUNTER — Other Ambulatory Visit: Payer: Self-pay | Admitting: Family Medicine

## 2022-12-30 MED ORDER — DEXCOM G7 SENSOR MISC: 3 refills | Status: DC

## 2022-12-30 NOTE — Telephone Encounter (Signed)
LMTCB and sent mychart

## 2022-12-30 NOTE — Telephone Encounter (Signed)
Have taken care of already. Sent message through my chart.

## 2022-12-30 NOTE — Telephone Encounter (Signed)
Called patient and scheduled for sooner appt.

## 2023-01-05 ENCOUNTER — Encounter: Payer: Self-pay | Admitting: Neurology

## 2023-01-05 ENCOUNTER — Telehealth: Payer: Self-pay | Admitting: Neurology

## 2023-01-05 NOTE — Telephone Encounter (Signed)
Refill on Topiramate 50mg  emgality 120 mg inj penn  Meds by Mail.

## 2023-01-06 ENCOUNTER — Encounter: Admitting: Medical Oncology

## 2023-01-06 ENCOUNTER — Other Ambulatory Visit

## 2023-01-06 ENCOUNTER — Encounter: Payer: Self-pay | Admitting: Internal Medicine

## 2023-01-06 ENCOUNTER — Ambulatory Visit (INDEPENDENT_AMBULATORY_CARE_PROVIDER_SITE_OTHER): Admitting: Internal Medicine

## 2023-01-06 ENCOUNTER — Other Ambulatory Visit: Payer: Self-pay | Admitting: Internal Medicine

## 2023-01-06 ENCOUNTER — Other Ambulatory Visit: Payer: Self-pay | Admitting: Neurology

## 2023-01-06 VITALS — BP 120/80 | HR 91 | Ht 66.0 in | Wt 290.0 lb

## 2023-01-06 DIAGNOSIS — E221 Hyperprolactinemia: Secondary | ICD-10-CM

## 2023-01-06 DIAGNOSIS — Z7985 Long-term (current) use of injectable non-insulin antidiabetic drugs: Secondary | ICD-10-CM | POA: Diagnosis not present

## 2023-01-06 DIAGNOSIS — E1165 Type 2 diabetes mellitus with hyperglycemia: Secondary | ICD-10-CM | POA: Diagnosis not present

## 2023-01-06 DIAGNOSIS — Z7984 Long term (current) use of oral hypoglycemic drugs: Secondary | ICD-10-CM | POA: Diagnosis not present

## 2023-01-06 DIAGNOSIS — D352 Benign neoplasm of pituitary gland: Secondary | ICD-10-CM

## 2023-01-06 LAB — POCT GLUCOSE (DEVICE FOR HOME USE): Glucose Fasting, POC: 245 mg/dL — AB (ref 70–99)

## 2023-01-06 LAB — T4, FREE: Free T4: 0.83 ng/dL (ref 0.60–1.60)

## 2023-01-06 LAB — TSH: TSH: 0.87 u[IU]/mL (ref 0.35–5.50)

## 2023-01-06 MED ORDER — CABERGOLINE 0.5 MG PO TABS: ORAL_TABLET | ORAL | 0 refills | Status: DC

## 2023-01-06 MED ORDER — METFORMIN HCL ER (OSM) 1000 MG PO TB24: 1000.0000 mg | ORAL_TABLET | Freq: Two times a day (BID) | ORAL | 3 refills | Status: DC

## 2023-01-06 MED ORDER — EMGALITY 120 MG/ML ~~LOC~~ SOAJ: 120.0000 mg | SUBCUTANEOUS | 11 refills | Status: DC

## 2023-01-06 MED ORDER — TOPIRAMATE 100 MG PO TABS: 100.0000 mg | ORAL_TABLET | Freq: Every day | ORAL | 5 refills | Status: DC

## 2023-01-06 MED ORDER — ACCU-CHEK GUIDE VI STRP: 1.0000 | ORAL_STRIP | Freq: Every day | 3 refills | Status: DC

## 2023-01-06 NOTE — Progress Notes (Unsigned)
Name: Amber [name]  MRN/ DOB: [id number], 1980/10/18    Age/ Sex: 42 y.o., female     PCP: Sharlene Dory, DO   Reason for Endocrinology Evaluation: Hyperprolactinemia      Initial Endocrinology Clinic Visit: 03/02/2016    PATIENT IDENTIFIER: Amber [name] is a 42 y.o., female with a past medical history of Prolactinoma, GERD, Asthma and migraine headaches . She has followed with [facility] since 03/02/2016 for consultative assistance with management of her hyperprolactinemia .   HISTORICAL SUMMARY: The patient was first diagnosed with hyperprolactinemia in 1996 during evaluation for primary amenorrhea ( highest reading 198 ). She is S/P resection of prolactinoma in 2009 [place], [place] due to size 3 cm, per pt she had a sphenoidal sinus penetration. Per pt prolactin levels normalized and menses resumed.  Pt was noted with elevated prolactin levels again 2015 and was started on Cabergoline, imaging have been reported at " no tumor"     Cabergoline was started 2019 Bromocriptine was added to cabergoline 05/2019 by another endocrinologist  On her initial visit with me in 08/2019, we stopped the Bromocriptine and increased cabergoline. Since the dose exceeded 2 mg/ week , we proceed with an echocardiogram , which showed normal valve structure but there was a question about a retro aortic coronary artery. CT angio was normal.     Repeat pituitary MRI in 08/2019 showed regression of pituitary gland     NO FH of pituitary disease    DM HISTORY : The patient was diagnosed with T2DM 11/2022 with an A1c of 11.6%.    SUBJECTIVE:    Today (01/06/2023):  Amber [name] is here for a follow up on hyperprolactinemia ,and pituitary adenoma.   Since her last visit here she has been diagnosed with diabetes mellitus with an A1c of 11.6% 11/2022  She continues to be on metformin, and Mounjaro was added through PCPs office but has not started yet .   Denies  nipple discharge  Has noted vision changes, scheduled  for an eye exam 03/2023 She continues with migraine headaches , follows with Dr. Adriana Mccallum  Denies nausea, vomiting Denies constipation  or diarrhea   She is on Cabergoline 0.5 mg:  Two tabs on Mondays, 1 tablet on wednesday and 2 tabs on Friday  Metformin 1000 mg  BID  Mounjaro    HISTORY:  Past Medical History:  Past Medical History:  Diagnosis Date   Anxiety    Asthma    Depression    GERD (gastroesophageal reflux disease)    History of chicken pox    History of migraine headaches    Migraines    Pituitary adenoma (HCC)    Seizures (HCC)    Past Surgical History:  Past Surgical History:  Procedure Laterality Date   APPENDECTOMY     CHOLECYSTECTOMY     Pituiary Tumor     Tumor Removal Pituitary   Social History:  reports that she quit smoking about 6 years ago. Her smoking use included cigarettes. She started smoking about 21 years ago. She has a 15 pack-year smoking history. She has never used smokeless tobacco. She reports that she does not drink alcohol and does not use drugs. Family History:  Family History  Problem Relation Age of Onset   Diabetes Mother    Migraines Mother    Hyperlipidemia Mother    Bipolar disorder Mother    Irritable bowel syndrome Mother    Diabetes Father    Colon  cancer Father    Cancer Father        colon   Hyperlipidemia Father    Hypertension Father    Bipolar disorder Brother    Migraines Sister    Other Neg Hx        pituitary disorder   Esophageal cancer Neg Hx      HOME MEDICATIONS: Allergies as of 01/06/2023       Reactions   Aspirin    Beeswax    Chlorhexidine    Ibuprofen    Iodine    Lunesta [eszopiclone] Other (See Comments)   GI issues   Nsaids Nausea And Vomiting   Shellfish Allergy    Tingle in mouth/throat   Sulfa Antibiotics    Triptans Other (See Comments)   Makes pain worse   Tape Rash   Blisters        Medication List         Accurate as of January 06, 2023 11:59 PM. If you have any questions, ask your nurse or doctor.          Accu-Chek Guide test strip Generic drug: glucose blood 1 each by Other route daily in the afternoon. Use as instructed Started by: Johnney Ou Shields Pautz   acetaminophen 500 MG tablet Commonly known as: TYLENOL Take 500 mg by mouth as needed.   cabergoline 0.5 MG tablet Commonly known as: DOSTINEX 2.5 mg weekly (Two tabs on Mondays, 1 tab on wednesdays and 2 tabs on Fridays)   Dexcom G7 Sensor Misc Use daily to check blood sugar,  DXE11.65   Emgality 120 MG/ML Soaj Generic drug: Galcanezumab-gnlm Inject 120 mg into the skin every 21 ( twenty-one) days. What changed: Another medication with the same name was removed. Continue taking this medication, and follow the directions you see here. Changed by: Adam Gus Rankin   escitalopram 10 MG tablet Commonly known as: LEXAPRO TAKE ONE TABLET BY MOUTH EVERY DAY   famotidine 20 MG tablet Commonly known as: PEPCID Take 20 mg by mouth 2 (two) times daily.   famotidine 20 MG tablet Commonly known as: Pepcid Take 1 tablet (20 mg total) by mouth 2 (two) times daily.   fenofibrate 145 MG tablet Commonly known as: TRICOR Take 1 tablet (145 mg total) by mouth daily.   hydrOXYzine 25 MG tablet Commonly known as: ATARAX TAKE 1-3 TABLETS (25-75 MG TOTAL) BY MOUTH 3 (THREE) TIMES DAILY AS NEEDED FOR ANXIETY.   ketoconazole 2 % cream Commonly known as: NIZORAL Apply 1 Application topically daily.   metformin 1000 MG (OSM) 24 hr tablet Commonly known as: FORTAMET Take 1 tablet (1,000 mg total) by mouth 2 (two) times daily with a meal. What changed: when to take this Changed by: Johnney Ou Yousaf Sainato   Nurtec 75 MG Tbdp Generic drug: Rimegepant Sulfate Take 1 tablet (75 mg total) by mouth as needed (Daily as needed for a Migraine). Maximum 1 tablet in 24 hours.  Quantity 8.   ondansetron 8 MG tablet Commonly known as:  Zofran Take 1 tablet (8 mg total) by mouth as needed for nausea or vomiting.   pantoprazole 40 MG tablet Commonly known as: PROTONIX Take 1 tablet (40 mg total) by mouth 2 (two) times daily.   predniSONE 20 MG tablet Commonly known as: DELTASONE 2 tabs po daily x 4 days   QUEtiapine 300 MG tablet Commonly known as: SEROQUEL Take 1 tablet (300 mg total) by mouth at bedtime.   Riboflavin 400 MG Caps Take 400  mg by mouth daily.   rosuvastatin 20 MG tablet Commonly known as: Crestor Take 1 tablet (20 mg total) by mouth daily.   tirzepatide 2.5 MG/0.5ML Pen Commonly known as: MOUNJARO Inject 2.5 mg into the skin once a week for 28 days.   tirzepatide 5 MG/0.5ML Pen Commonly known as: MOUNJARO Inject 5 mg into the skin once a week for 28 days. Start taking on: January 25, 2023   tirzepatide 7.5 MG/0.5ML Pen Commonly known as: MOUNJARO Inject 7.5 mg into the skin once a week for 28 days. Start taking on: February 22, 2023   tiZANidine 4 MG tablet Commonly known as: Zanaflex Take 1 tablet (4 mg total) by mouth every 6 (six) hours as needed for muscle spasms.   topiramate 100 MG tablet Commonly known as: TOPAMAX Take 1 tablet (100 mg total) by mouth at bedtime. What changed: Another medication with the same name was removed. Continue taking this medication, and follow the directions you see here. Changed by: Adam Gus Rankin   traMADol 50 MG tablet Commonly known as: ULTRAM TAKE ONE TABLET BY MOUTH EVERY 6 HOURS AS NEEDED   Vitamin D (Ergocalciferol) 1.25 MG (50000 UNIT) Caps capsule Commonly known as: DRISDOL Take 1 capsule (50,000 Units total) by mouth every 7 (seven) days.          OBJECTIVE:   PHYSICAL EXAM: VS: BP 120/80 (BP Location: Right Arm, Patient Position: Sitting, Cuff Size: Large)   Pulse 91   Ht 5\' 6"  (1.676 m)   Wt 290 lb (131.5 kg)   SpO2 99%   BMI 46.81 kg/m    EXAM: General: Pt appears well and is in NAD  Neck: General: Supple without  adenopathy. Thyroid: Thyroid size normal.  No goiter or nodules appreciated.  Lungs: Clear with good BS bilat   Heart: Auscultation: RRR.  Mental Status: Judgment, insight: Intact Orientation: Oriented to time, place, and person Mood and affect: No depression, anxiety, or agitation      DATA REVIEWED:    Latest Reference Range & Units 01/06/23 10:27  Prolactin ng/mL 21.3  TSH 0.35 - 5.50 uIU/mL 0.87  T4,Free(Direct) 0.60 - 1.60 ng/dL 1.61    Latest Reference Range & Units 12/24/22 22:58  Sodium 135 - 145 mmol/L 134 (L)  Potassium 3.5 - 5.1 mmol/L 3.7  Chloride 98 - 111 mmol/L 101  CO2 22 - 32 mmol/L 22  Glucose 70 - 99 mg/dL 096 (H)  BUN 6 - 20 mg/dL 10  Creatinine 0.45 - 4.09 mg/dL 8.11 (H)  Calcium 8.9 - 10.3 mg/dL 8.9  Anion gap 5 - 15  11  GFR, Estimated >60 mL/min 59 (L)    Latest Reference Range & Units 01/06/23 10:27  TSH 0.35 - 5.50 uIU/mL 0.87  T4,Free(Direct) 0.60 - 1.60 ng/dL 9.14    MRI 7/82/9562  Dedicated pituitary imaging. Gland size appears slightly smaller now compared to 2018 (series 16, image 9 today versus series 17, image 6 previously). No suprasellar mass or mass effect. Infundibulum and hypothalamus appear normal.   Regressed hypoenhancing area in the left inferior and lateral aspect of the gland. Today there is only subtle heterogeneous enhancement on that side (series 7, image 10) encompassing 3-4 mm. The cavernous sinuses remain normal. Normal clivus. No other abnormal enhancement.   IMPRESSION: 1. Regression of pituitary microadenoma since 2018, subtle residual heterogeneity in the left aspect of the gland encompassing 3-4 mm.   2. No new intracranial abnormality. Stable mild nonspecific cerebral white matter signal  changes. ASSESSMENT / PLAN / RECOMMENDATIONS:   Hx of Pituitary Macroadenoma , S/P Transphenoidal surgery in 2009:  - Per pt she had a transphenoidal surgery for a 3 cm pituitary adenoma in 2009 - Her last MRI was in  08/2019 indicating regression  - Labs have been normal to include TSH, ACTH , cortisol and IGF-1     Hyperprolactinemia :  - Diagnosed in 1996 - Normalization of levels following transphenoidal pituitary resection in 2009 , restarted on cabergoline in 2015 and again in 2019.  - In March 2021, bromocriptine was added to her regimen by previouse endocrinologist, this was discontinued in 08/2019 and increased cabergoline.  - Pt will need echocardiogram by next visit  -Repeat prolactin remains normal    Medications   Continue cabergoline 0.5 mg ,a total of  2.5 mg weekly (Two tabs on Mondays, 1 tab on wednesdays and 2 tabs on Fridays)   2. Type 2 Diabetes Mellitus , Newly Diagnosed , A1c 11.6%   -She was recently diagnosed with T2DM -She was already on metformin for prediabetes and this has been increased to twice daily, patient needed a new prescription updated, which was done today -She was prescribed Mounjaro through her PCPs office, has not started yet -A referral to our CDE has been placed based on her request   Continue Metformin 1000 mg Twice daily Mounjaro 2.5 mg weekly per PCPs recommendations    F/U in 6 months      Signed electronically by: Lyndle Herrlich, MD  [facility]  [facility] [address] [place] [place], [place] [address] Phone: [phone] FAX: [phone]      CC: Sharlene Dory, DO [address] [place] [address] Phone: [phone]  Fax: [phone]   Return to Endocrinology clinic as below: Future Appointments  Date Time Provider Department Center  01/13/2023  1:00 PM Rushie Chestnut, PA-C [facility] None  01/13/2023  2:00 PM [facility] LAB [facility] None  02/08/2023  2:30 PM Sharlene Dory, DO [facility]  04/25/2023 12:10 PM Nawaf Strange, Konrad Dolores, MD [facility] None  06/12/2023  2:10 PM Drema Dallas, DO [facility] None

## 2023-01-07 LAB — PROLACTIN: Prolactin: 21.3 ng/mL

## 2023-01-09 MED ORDER — CABERGOLINE 0.5 MG PO TABS: ORAL_TABLET | ORAL | 3 refills | Status: AC

## 2023-01-11 ENCOUNTER — Other Ambulatory Visit: Payer: Self-pay

## 2023-01-11 ENCOUNTER — Emergency Department (HOSPITAL_BASED_OUTPATIENT_CLINIC_OR_DEPARTMENT_OTHER)
Admission: EM | Admit: 2023-01-11 | Discharge: 2023-01-12 | Disposition: A | Discharge status: Home / Self Care | Attending: Emergency Medicine | Admitting: Emergency Medicine

## 2023-01-11 DIAGNOSIS — R519 Headache, unspecified: Secondary | ICD-10-CM | POA: Diagnosis present

## 2023-01-11 DIAGNOSIS — E1165 Type 2 diabetes mellitus with hyperglycemia: Secondary | ICD-10-CM | POA: Insufficient documentation

## 2023-01-11 DIAGNOSIS — G43809 Other migraine, not intractable, without status migrainosus: Secondary | ICD-10-CM | POA: Diagnosis not present

## 2023-01-11 DIAGNOSIS — Z7984 Long term (current) use of oral hypoglycemic drugs: Secondary | ICD-10-CM | POA: Diagnosis not present

## 2023-01-11 NOTE — ED Triage Notes (Signed)
Pt presents with complaints of headaches since 2am. Endorses nausea and eye pain and photosensitivity.

## 2023-01-12 LAB — CBC WITH DIFFERENTIAL/PLATELET
Abs Immature Granulocytes: 0.04 10*3/uL (ref 0.00–0.07)
Basophils Absolute: 0 10*3/uL (ref 0.0–0.1)
Basophils Relative: 0 %
Eosinophils Absolute: 0.2 10*3/uL (ref 0.0–0.5)
Eosinophils Relative: 2 %
HCT: 34.4 % — ABNORMAL LOW (ref 36.0–46.0)
Hemoglobin: 10.8 g/dL — ABNORMAL LOW (ref 12.0–15.0)
Immature Granulocytes: 0 %
Lymphocytes Relative: 27 %
Lymphs Abs: 3 10*3/uL (ref 0.7–4.0)
MCH: 23.6 pg — ABNORMAL LOW (ref 26.0–34.0)
MCHC: 31.4 g/dL (ref 30.0–36.0)
MCV: 75.1 fL — ABNORMAL LOW (ref 80.0–100.0)
Monocytes Absolute: 0.8 10*3/uL (ref 0.1–1.0)
Monocytes Relative: 7 %
Neutro Abs: 7.1 10*3/uL (ref 1.7–7.7)
Neutrophils Relative %: 64 %
Platelets: 393 10*3/uL (ref 150–400)
RBC: 4.58 MIL/uL (ref 3.87–5.11)
RDW: 16.4 % — ABNORMAL HIGH (ref 11.5–15.5)
WBC: 11.2 10*3/uL — ABNORMAL HIGH (ref 4.0–10.5)
nRBC: 0 % (ref 0.0–0.2)

## 2023-01-12 LAB — BASIC METABOLIC PANEL
Anion gap: 8 (ref 5–15)
BUN: 14 mg/dL (ref 6–20)
CO2: 26 mmol/L (ref 22–32)
Calcium: 8.9 mg/dL (ref 8.9–10.3)
Chloride: 101 mmol/L (ref 98–111)
Creatinine, Ser: 0.93 mg/dL (ref 0.44–1.00)
GFR, Estimated: >60 mL/min (ref >60)
Glucose, Bld: 198 mg/dL — ABNORMAL HIGH (ref 70–99)
Potassium: 3.6 mmol/L (ref 3.5–5.1)
Sodium: 135 mmol/L (ref 135–145)

## 2023-01-12 MED ORDER — METOCLOPRAMIDE HCL 5 MG/ML IJ SOLN
10.0000 mg | Freq: Once | INTRAMUSCULAR | Status: AC
  Administered 2023-01-12: 10 mg via INTRAVENOUS
  Filled 2023-01-12: qty 2

## 2023-01-12 MED ORDER — DROPERIDOL 2.5 MG/ML IJ SOLN
2.5000 mg | Freq: Once | INTRAMUSCULAR | Status: AC
  Administered 2023-01-12: 2.5 mg via INTRAVENOUS
  Filled 2023-01-12: qty 2

## 2023-01-12 MED ORDER — DIPHENHYDRAMINE HCL 50 MG/ML IJ SOLN
25.0000 mg | Freq: Once | INTRAMUSCULAR | Status: AC
Start: 2023-01-12 — End: 2023-01-12
  Administered 2023-01-12: 25 mg via INTRAVENOUS
  Filled 2023-01-12: qty 1

## 2023-01-12 MED ORDER — PROMETHAZINE HCL 25 MG PO TABS: 25.0000 mg | ORAL_TABLET | Freq: Three times a day (TID) | ORAL | 0 refills | Status: DC | PRN

## 2023-01-12 MED ORDER — DROPERIDOL 2.5 MG/ML IJ SOLN
1.2500 mg | Freq: Once | INTRAMUSCULAR | Status: DC
Start: 2023-01-12 — End: 2023-01-12
  Filled 2023-01-12: qty 2

## 2023-01-12 NOTE — ED Provider Notes (Signed)
[place] [facility] Provider Note   CSN: [id number] Arrival date & time: 01/11/23  2001     History  Chief Complaint  Patient presents with   Headache    Amber [name] is a 42 y.o. female.  The history is provided by the patient.  Headache Amber [name] is a 42 y.o. female who presents to the Emergency Department complaining of migraine.  She presents the emergency department for evaluation of migraine headache that started 2 AM today.  She took Nurtec at noon without any improvement in her symptoms.  She has associated nausea.  Headache is located on the left side of her head and behind her left eye.  Headache is similar to prior migraines.  No associated fevers, numbness, weakness.  She has a history of migraines as well as diabetes.  She started on Mounjaro recently and had her metformin increased due to worsening of her A1c.      Home Medications Prior to Admission medications   Medication Sig Start Date End Date Taking? Authorizing Provider  promethazine (PHENERGAN) 25 MG tablet Take 1 tablet (25 mg total) by mouth every 8 (eight) hours as needed for nausea or vomiting. 01/12/23  Yes Tilden Fossa, MD  acetaminophen (TYLENOL) 500 MG tablet Take 500 mg by mouth as needed.    [provider]  cabergoline (DOSTINEX) 0.5 MG tablet 2.5 mg weekly (Two tabs on Mondays, 1 tab on wednesdays and 2 tabs on Fridays) 01/09/23   Shamleffer, Konrad Dolores, MD  Continuous Glucose Sensor (DEXCOM G7 SENSOR) MISC Use daily to check blood sugar,  DXE11.65 12/30/22   Sharlene Dory, DO  escitalopram (LEXAPRO) 10 MG tablet TAKE ONE TABLET BY MOUTH EVERY DAY 09/05/22   Sharlene Dory, DO  famotidine (PEPCID) 20 MG tablet Take 20 mg by mouth 2 (two) times daily.    [provider]  famotidine (PEPCID) 20 MG tablet Take 1 tablet (20 mg total) by mouth 2 (two) times daily. 11/06/21   Palumbo, April, MD  fenofibrate  (TRICOR) 145 MG tablet Take 1 tablet (145 mg total) by mouth daily. 09/01/22   Wendling, Jilda Roche, DO  Galcanezumab-gnlm (EMGALITY) 120 MG/ML SOAJ Inject 120 mg into the skin every 21 ( twenty-one) days. 01/06/23   Everlena Cooper, Adam R, DO  glucose blood (ACCU-CHEK GUIDE) test strip 1 each by Other route daily in the afternoon. Use as instructed 01/06/23   Shamleffer, Konrad Dolores, MD  hydrOXYzine (ATARAX/VISTARIL) 25 MG tablet TAKE 1-3 TABLETS (25-75 MG TOTAL) BY MOUTH 3 (THREE) TIMES DAILY AS NEEDED FOR ANXIETY. 01/22/18   Sharlene Dory, DO  ketoconazole (NIZORAL) 2 % cream Apply 1 Application topically daily. 12/28/22 02/08/23  Sharlene Dory, DO  metformin (FORTAMET) 1000 MG (OSM) 24 hr tablet Take 1 tablet (1,000 mg total) by mouth 2 (two) times daily with a meal. 01/06/23   Shamleffer, Konrad Dolores, MD  ondansetron (ZOFRAN) 8 MG tablet Take 1 tablet (8 mg total) by mouth as needed for nausea or vomiting. 06/28/21   Everlena Cooper, Adam R, DO  pantoprazole (PROTONIX) 40 MG tablet Take 1 tablet (40 mg total) by mouth 2 (two) times daily. 09/01/22   Sharlene Dory, DO  predniSONE (DELTASONE) 20 MG tablet 2 tabs po daily x 4 days Patient not taking: Reported on 01/06/2023 11/06/21   Palumbo, April, MD  QUEtiapine (SEROQUEL) 300 MG tablet Take 1 tablet (300 mg total) by mouth at bedtime. 09/01/22   Sharlene Dory,  DO  Riboflavin 400 MG CAPS Take 400 mg by mouth daily.    [provider]  Rimegepant Sulfate (NURTEC) 75 MG TBDP Take 1 tablet (75 mg total) by mouth as needed (Daily as needed for a Migraine). Maximum 1 tablet in 24 hours.  Quantity 8. 06/22/22   Everlena Cooper, Adam R, DO  rosuvastatin (CRESTOR) 20 MG tablet Take 1 tablet (20 mg total) by mouth daily. 09/01/22   Sharlene Dory, DO  tirzepatide [facility]) 2.5 MG/0.5ML Pen Inject 2.5 mg into the skin once a week for 28 days. 12/28/22 01/25/23  Sharlene Dory, DO  tirzepatide [facility]) 5 MG/0.5ML Pen  Inject 5 mg into the skin once a week for 28 days. 01/25/23 02/22/23  Sharlene Dory, DO  tirzepatide [facility]) 7.5 MG/0.5ML Pen Inject 7.5 mg into the skin once a week for 28 days. 02/22/23 03/22/23  Sharlene Dory, DO  tiZANidine (ZANAFLEX) 4 MG tablet Take 1 tablet (4 mg total) by mouth every 6 (six) hours as needed for muscle spasms. 11/24/21   Sharlene Dory, DO  topiramate (TOPAMAX) 100 MG tablet Take 1 tablet (100 mg total) by mouth at bedtime. 01/06/23   Drema Dallas, DO  traMADol (ULTRAM) 50 MG tablet TAKE ONE TABLET BY MOUTH EVERY 6 HOURS AS NEEDED 12/23/22   Drema Dallas, DO  Vitamin D, Ergocalciferol, (DRISDOL) 1.25 MG (50000 UNIT) CAPS capsule Take 1 capsule (50,000 Units total) by mouth every 7 (seven) days. 11/25/21   Sharlene Dory, DO      Allergies    Aspirin, Beeswax, Chlorhexidine, Ibuprofen, Iodine, Lunesta [eszopiclone], Nsaids, Shellfish allergy, Sulfa antibiotics, Triptans, and Tape    Review of Systems   Review of Systems  Neurological:  Positive for headaches.  All other systems reviewed and are negative.   Physical Exam Updated Vital Signs BP 110/68   Pulse 80   Temp 98.2 F (36.8 C) (Oral)   Resp 16   Ht 5\' 6"  (1.676 m)   Wt 131.5 kg   SpO2 99%   BMI 46.79 kg/m  Physical Exam Vitals and nursing note reviewed.  Constitutional:      Appearance: She is well-developed.  HENT:     Head: Normocephalic and atraumatic.  Eyes:     Extraocular Movements: Extraocular movements intact.     Pupils: Pupils are equal, round, and reactive to light.  Cardiovascular:     Rate and Rhythm: Normal rate and regular rhythm.  Pulmonary:     Effort: Pulmonary effort is normal. No respiratory distress.  Abdominal:     Palpations: Abdomen is soft.     Tenderness: There is no abdominal tenderness. There is no guarding or rebound.  Musculoskeletal:        General: No tenderness.     Cervical back: Neck supple.  Skin:    General: Skin is  warm and dry.  Neurological:     Mental Status: She is alert and oriented to person, place, and time.     Comments: No asymmetry of facial movements.  5/5 strength in all four extremities.   Psychiatric:        Behavior: Behavior normal.     ED Results / Procedures / Treatments   Labs (all labs ordered are listed, but only abnormal results are displayed) Labs Reviewed  BASIC METABOLIC PANEL - Abnormal; Notable for the following components:      Result Value   Glucose, Bld 198 (*)    All other components within  normal limits  CBC WITH DIFFERENTIAL/PLATELET - Abnormal; Notable for the following components:   WBC 11.2 (*)    Hemoglobin 10.8 (*)    HCT 34.4 (*)    MCV 75.1 (*)    MCH 23.6 (*)    RDW 16.4 (*)    All other components within normal limits    EKG None  Radiology No results found.  Procedures Procedures    Medications Ordered in ED Medications  metoCLOPramide (REGLAN) injection 10 mg (10 mg Intravenous Given 01/12/23 0034)  diphenhydrAMINE (BENADRYL) injection 25 mg (25 mg Intravenous Given 01/12/23 0031)  droperidol (INAPSINE) 2.5 MG/ML injection 2.5 mg (2.5 mg Intravenous Given 01/12/23 0200)    ED Course/ Medical Decision Making/ A&P                                 Medical Decision Making Amount and/or Complexity of Data Reviewed Labs: ordered.  Risk Prescription drug management.   Patient with history of migraine headaches here for evaluation of headaches similar to prior migraines.  She is nontoxic-appearing on evaluation with no focal neurologic deficits.  Given her recent worsening of her diabetes labs were obtained, with mild hyperglycemia but no significant electrolyte abnormality.  Current clinical picture is not consistent with meningitis, subarachnoid hemorrhage, CVA, dural sinus thrombosis.  She was treated with medications in the emergency department with improvement in her headache.  Plan to discharge home with outpatient follow-up and  return precautions.        Final Clinical Impression(s) / ED Diagnoses Final diagnoses:  Other migraine without status migrainosus, not intractable    Rx / DC Orders ED Discharge Orders          Ordered    promethazine (PHENERGAN) 25 MG tablet  Every 8 hours PRN        01/12/23 0351              Tilden Fossa, MD 01/12/23 [phone]

## 2023-01-13 ENCOUNTER — Inpatient Hospital Stay

## 2023-01-13 ENCOUNTER — Other Ambulatory Visit: Payer: Self-pay

## 2023-01-13 ENCOUNTER — Encounter: Payer: Self-pay | Admitting: Medical Oncology

## 2023-01-13 ENCOUNTER — Inpatient Hospital Stay: Attending: Medical Oncology | Admitting: Medical Oncology

## 2023-01-13 VITALS — BP 117/75 | HR 100 | Temp 97.7°F | Resp 16 | Ht 66.0 in | Wt 283.0 lb

## 2023-01-13 DIAGNOSIS — D509 Iron deficiency anemia, unspecified: Secondary | ICD-10-CM | POA: Diagnosis not present

## 2023-01-13 DIAGNOSIS — K219 Gastro-esophageal reflux disease without esophagitis: Secondary | ICD-10-CM

## 2023-01-13 DIAGNOSIS — Z800 Family history of malignant neoplasm of digestive organs: Secondary | ICD-10-CM

## 2023-01-13 NOTE — Progress Notes (Signed)
[facility] [phone]   [phone]  INITIAL CONSULT NOTE  Patient Care Team: Sharlene Dory, DO as PCP - General (Family Medicine) Drema Dallas, DO as Consulting Physician (Neurology)  CHIEF COMPLAINTS/PURPOSE OF CONSULTATION:  "Iron Deficiency Anemia "  HISTORY OF PRESENTING ILLNESS:  Amber [name] 42 y.o. female is referred to our office by their PCP Dr. Carmelia Roller.   Her IDA was first discovered last month to her knowledge.   Blood in stools:No Blood in urine: No Difficulty swallowing: Sometimes Pica: Yes SOB: No Fatigue: Yes Change of bowel movement/constipation: Yes secondary to mounjaro. Has IBS Colonoscopy: No. Had an Endoscopy completed a few years ago.  Prior blood transfusion: No Prior history of blood loss: No Liver disease: No Bariatric surgery: No UC/Crohn's: No Frequent Blood Donation: No Vaginal bleeding: Regular and average in heaviness Prior evaluation with hematology: No Prior bone marrow biopsy: No Oral iron: Not currently- does not tolerate it well- N/V/D Prior IV iron infusions: No Family history Anemia: No- no known sickle cell or thalassemia  No Personal or family history of bleeding or clotting disorders.  Not on any special diets: Not currently  Iron rich foods in diet: Yes No upcoming planned pregnancies and not currently pregnant Family history of colon cancer- father diagnosed around age    MEDICAL HISTORY:  Past Medical History:  Diagnosis Date   Anxiety    Asthma    Depression    GERD (gastroesophageal reflux disease)    History of chicken pox    History of migraine headaches    Migraines    Pituitary adenoma (HCC)    Seizures (HCC)     SURGICAL HISTORY: Past Surgical History:  Procedure Laterality Date   APPENDECTOMY     CHOLECYSTECTOMY     Pituiary Tumor     Tumor Removal Pituitary    SOCIAL HISTORY: Social History   Socioeconomic History   Marital status: Married     Spouse name: Amber [name]   Number of children: 0   Years of education: Not on file   Highest education level: 12th grade  Occupational History   Occupation: unemployed  Tobacco Use   Smoking status: Former    Current packs/day: 0.00    Average packs/day: 1 pack/day for 15.0 years (15.0 ttl pk-yrs)    Types: Cigarettes    Start date: 01/23/2001    Quit date: 01/24/2016    Years since quitting: 6.9   Smokeless tobacco: Never  Vaping Use   Vaping status: Every Day  Substance and Sexual Activity   Alcohol use: No   Drug use: No   Sexual activity: Yes    Birth control/protection: None  Other Topics Concern   Not on file  Social History Narrative   Patient is right-handed. She lives with her husband in a 2 level home. She drinks I cup of coffee a day. She walks daily, and rides her recumbent bike QOD.   Social Determinants of Health   Financial Resource Strain: Not on file  Food Insecurity: Not on file  Transportation Needs: Not on file  Physical Activity: Not on file  Stress: Not on file  Social Connections: Not on file  Intimate Partner Violence: Not on file    FAMILY HISTORY: Family History  Problem Relation Age of Onset   Diabetes Mother    Migraines Mother    Hyperlipidemia Mother    Bipolar disorder Mother    Irritable bowel syndrome Mother    Diabetes Father  Colon cancer Father    Cancer Father        colon   Hyperlipidemia Father    Hypertension Father    Bipolar disorder Brother    Migraines Sister    Other Neg Hx        pituitary disorder   Esophageal cancer Neg Hx     ALLERGIES:  is allergic to aspirin, beeswax, chlorhexidine, ibuprofen, iodine, lunesta [eszopiclone], nsaids, shellfish allergy, sulfa antibiotics, triptans, and tape.  MEDICATIONS:  Current Outpatient Medications  Medication Sig Dispense Refill   acetaminophen (TYLENOL) 500 MG tablet Take 500 mg by mouth as needed.     cabergoline (DOSTINEX) 0.5 MG tablet 2.5 mg weekly (Two tabs on  Mondays, 1 tab on wednesdays and 2 tabs on Fridays) 65 tablet 3   Continuous Glucose Sensor (DEXCOM G7 SENSOR) MISC Use daily to check blood sugar,  DXE11.65 1 each 3   escitalopram (LEXAPRO) 10 MG tablet TAKE ONE TABLET BY MOUTH EVERY DAY 90 tablet 3   famotidine (PEPCID) 20 MG tablet Take 20 mg by mouth 2 (two) times daily.     famotidine (PEPCID) 20 MG tablet Take 1 tablet (20 mg total) by mouth 2 (two) times daily. 10 tablet 0   fenofibrate (TRICOR) 145 MG tablet Take 1 tablet (145 mg total) by mouth daily. 90 tablet 3   Galcanezumab-gnlm (EMGALITY) 120 MG/ML SOAJ Inject 120 mg into the skin every 21 ( twenty-one) days. 1.12 mL 11   glucose blood (ACCU-CHEK GUIDE) test strip 1 each by Other route daily in the afternoon. Use as instructed 100 each 3   hydrOXYzine (ATARAX/VISTARIL) 25 MG tablet TAKE 1-3 TABLETS (25-75 MG TOTAL) BY MOUTH 3 (THREE) TIMES DAILY AS NEEDED FOR ANXIETY. 270 tablet 1   ketoconazole (NIZORAL) 2 % cream Apply 1 Application topically daily. 30 g 0   metformin (FORTAMET) 1000 MG (OSM) 24 hr tablet Take 1 tablet (1,000 mg total) by mouth 2 (two) times daily with a meal. 180 tablet 3   ondansetron (ZOFRAN) 8 MG tablet Take 1 tablet (8 mg total) by mouth as needed for nausea or vomiting. 20 tablet 2   pantoprazole (PROTONIX) 40 MG tablet Take 1 tablet (40 mg total) by mouth 2 (two) times daily. 180 tablet 3   predniSONE (DELTASONE) 20 MG tablet 2 tabs po daily x 4 days (Patient not taking: Reported on 01/06/2023) 8 tablet 0   promethazine (PHENERGAN) 25 MG tablet Take 1 tablet (25 mg total) by mouth every 8 (eight) hours as needed for nausea or vomiting. 12 tablet 0   QUEtiapine (SEROQUEL) 300 MG tablet Take 1 tablet (300 mg total) by mouth at bedtime. 90 tablet 3   Riboflavin 400 MG CAPS Take 400 mg by mouth daily.     Rimegepant Sulfate (NURTEC) 75 MG TBDP Take 1 tablet (75 mg total) by mouth as needed (Daily as needed for a Migraine). Maximum 1 tablet in 24 hours.  Quantity  8. 8 tablet 11   rosuvastatin (CRESTOR) 20 MG tablet Take 1 tablet (20 mg total) by mouth daily. 90 tablet 3   tirzepatide (MOUNJARO) 2.5 MG/0.5ML Pen Inject 2.5 mg into the skin once a week for 28 days. 2 mL 0   [START ON 01/25/2023] tirzepatide (MOUNJARO) 5 MG/0.5ML Pen Inject 5 mg into the skin once a week for 28 days. 2 mL 0   [START ON 02/22/2023] tirzepatide (MOUNJARO) 7.5 MG/0.5ML Pen Inject 7.5 mg into the skin once a week for 28 days.  2 mL 0   tiZANidine (ZANAFLEX) 4 MG tablet Take 1 tablet (4 mg total) by mouth every 6 (six) hours as needed for muscle spasms. 30 tablet 0   topiramate (TOPAMAX) 100 MG tablet Take 1 tablet (100 mg total) by mouth at bedtime. 30 tablet 5   traMADol (ULTRAM) 50 MG tablet TAKE ONE TABLET BY MOUTH EVERY 6 HOURS AS NEEDED 30 tablet 0   Vitamin D, Ergocalciferol, (DRISDOL) 1.25 MG (50000 UNIT) CAPS capsule Take 1 capsule (50,000 Units total) by mouth every 7 (seven) days. 12 capsule 0   No current facility-administered medications for this visit.    REVIEW OF SYSTEMS:   Constitutional: ( - ) fevers, ( - )  chills , ( + ) night sweats Eyes: ( - ) blurriness of vision, ( - ) double vision, ( - ) watery eyes Ears, nose, mouth, throat, and face: ( - ) mucositis, ( - ) sore throat Respiratory: ( - ) cough, ( - ) dyspnea, ( - ) wheezes Cardiovascular: ( - ) palpitation, ( - ) chest discomfort, ( - ) lower extremity swelling Gastrointestinal:  ( - ) nausea, ( - ) heartburn, ( - ) change in bowel habits Skin: ( - ) abnormal skin rashes Lymphatics: ( - ) new lymphadenopathy, ( - ) easy bruising Neurological: ( - ) numbness, ( - ) tingling, ( - ) new weaknesses Behavioral/Psych: ( - ) mood change, ( - ) new changes  All other systems were reviewed with the patient and are negative.  PHYSICAL EXAMINATION: ECOG PERFORMANCE STATUS: 1 - Symptomatic but completely ambulatory  Vitals:   01/13/23 1300  BP: 117/75  Pulse: 100  Resp: 16  Temp: 97.7 F (36.5 C)   SpO2: 98%   Filed Weights   01/13/23 1300  Weight: 283 lb (128.4 kg)    GENERAL: well appearing female in NAD  SKIN: Pallor, texture, turgor are normal, no rashes or significant lesions EYES: conjunctiva are very light pink and non-injected, sclera clear OROPHARYNX: no exudate, no erythema; lips, buccal mucosa, and tongue normal  NECK: supple, non-tender LYMPH:  no palpable lymphadenopathy in the cervical, axillary or supraclavicular lymph nodes.  LUNGS: clear to auscultation and percussion with normal breathing effort HEART: regular rate & rhythm and no murmurs and no lower extremity edema ABDOMEN: soft, non-tender, non-distended, normal bowel sounds Musculoskeletal: no cyanosis of digits and no clubbing  PSYCH: alert & oriented x 3, fluent speech NEURO: no focal motor/sensory deficits  LABORATORY DATA:  I have reviewed the data as listed    Latest Ref Rng & Units 01/12/2023   12:15 AM 12/24/2022   10:58 PM 11/30/2022    4:10 PM  CBC  WBC 4.0 - 10.5 K/uL 11.2  11.4  9.6   Hemoglobin 12.0 - 15.0 g/dL 16.1  09.6  04.5   Hematocrit 36.0 - 46.0 % 34.4  36.7  37.7   Platelets 150 - 400 K/uL 393  424  477.0        Latest Ref Rng & Units 01/12/2023   12:15 AM 12/24/2022   10:58 PM 11/30/2022    4:10 PM  CMP  Glucose 70 - 99 mg/dL 409  811  914   BUN 6 - 20 mg/dL 14  10  9    Creatinine 0.44 - 1.00 mg/dL 7.82  9.56  2.13   Sodium 135 - 145 mmol/L 135  134  132   Potassium 3.5 - 5.1 mmol/L 3.6  3.7  4.3  Chloride 98 - 111 mmol/L 101  101  98   CO2 22 - 32 mmol/L 26  22  23    Calcium 8.9 - 10.3 mg/dL 8.9  8.9  8.8   Total Protein 6.0 - 8.3 g/dL   7.1   Total Bilirubin 0.2 - 1.2 mg/dL   0.4   Alkaline Phos 39 - 117 U/L   81   AST 0 - 37 U/L   14   ALT 0 - 35 U/L   12    ASSESSMENT & PLAN Verenisse Oconnor is a 41 y.o. female who was referred to [place] for IDA. Suspected to be secondary to malabsorption and menstrual cycles.  Recent labs show a Hgb of 10.8, MCV of 75.1,  platelets of 393, WBC of 11.2. Creatinine of 0.93, ferritin of 5.1, iron saturation of 5.2%. TIBC of 497. B12 of 243.   She has clear IDA. Suspect this is secondary to malabsorption from her IBS/PPI use as well as her menstrual cycles. Given her age and family history I have also recommended a colonoscopy and endoscopy as soon as possible.  For now we will treat with IV iron. Discussed risks/benefits of IV iron along with discussion of the process.   Disposition Venofer 300 mg weekly x 3 RTC 2 months APP, labs   Orders Placed This Encounter  Procedures   Ambulatory referral to Gastroenterology    Referral Priority:   Routine    Referral Type:   Consultation    Referral Reason:   Specialty Services Required    Number of Visits Requested:   1    All questions were answered. The patient knows to call the clinic with any problems, questions or concerns.  I have spent a total of 30 minutes minutes of face-to-face and non-face-to-face time, preparing to see the patient, obtaining and/or reviewing separately obtained history, performing a medically appropriate examination, counseling and educating the patient, ordering medications/tests/procedures, referring and communicating with other health care professionals, documenting clinical information in the electronic health record, independently interpreting results and communicating results to the patient, and care coordination.    Clent Jacks PA-C Department of Hematology/Oncology [facility] at [facility]

## 2023-01-20 ENCOUNTER — Telehealth: Payer: Self-pay

## 2023-01-20 ENCOUNTER — Inpatient Hospital Stay: Attending: Medical Oncology

## 2023-01-20 VITALS — BP 96/60 | HR 88 | Temp 98.1°F | Resp 17

## 2023-01-20 DIAGNOSIS — D509 Iron deficiency anemia, unspecified: Secondary | ICD-10-CM | POA: Insufficient documentation

## 2023-01-20 MED ORDER — SODIUM CHLORIDE 0.9 % IV SOLN: INTRAVENOUS | Status: DC

## 2023-01-20 MED ORDER — SODIUM CHLORIDE 0.9 % IV SOLN
300.0000 mg | Freq: Once | INTRAVENOUS | Status: AC
  Administered 2023-01-20: 300 mg via INTRAVENOUS
  Filled 2023-01-20: qty 300

## 2023-01-20 NOTE — Patient Instructions (Signed)
Iron Sucrose Injection What is this medication? IRON SUCROSE (EYE ern SOO krose) treats low levels of iron (iron deficiency anemia) in people with kidney disease. Iron is a mineral that plays an important role in making red blood cells, which carry oxygen from your lungs to the rest of your body. This medicine may be used for other purposes; ask your health care provider or pharmacist if you have questions. COMMON BRAND NAME(S): Venofer What should I tell my care team before I take this medication? They need to know if you have any of these conditions: Anemia not caused by low iron levels Heart disease High levels of iron in the blood Kidney disease Liver disease An unusual or allergic reaction to iron, other medications, foods, dyes, or preservatives Pregnant or trying to get pregnant Breastfeeding How should I use this medication? This medication is for infusion into a vein. It is given in a hospital or clinic setting. Talk to your care team about the use of this medication in children. While this medication may be prescribed for children as young as 2 years for selected conditions, precautions do apply. Overdosage: If you think you have taken too much of this medicine contact a poison control center or emergency room at once. NOTE: This medicine is only for you. Do not share this medicine with others. What if I miss a dose? Keep appointments for follow-up doses. It is important not to miss your dose. Call your care team if you are unable to keep an appointment. What may interact with this medication? Do not take this medication with any of the following: Deferoxamine Dimercaprol Other iron products This medication may also interact with the following: Chloramphenicol Deferasirox This list may not describe all possible interactions. Give your health care provider a list of all the medicines, herbs, non-prescription drugs, or dietary supplements you use. Also tell them if you smoke,  drink alcohol, or use illegal drugs. Some items may interact with your medicine. What should I watch for while using this medication? Visit your care team regularly. Tell your care team if your symptoms do not start to get better or if they get worse. You may need blood work done while you are taking this medication. You may need to follow a special diet. Talk to your care team. Foods that contain iron include: whole grains/cereals, dried fruits, beans, or peas, leafy green vegetables, and organ meats (liver, kidney). What side effects may I notice from receiving this medication? Side effects that you should report to your care team as soon as possible: Allergic reactions--skin rash, itching, hives, swelling of the face, lips, tongue, or throat Low blood pressure--dizziness, feeling faint or lightheaded, blurry vision Shortness of breath Side effects that usually do not require medical attention (report to your care team if they continue or are bothersome): Flushing Headache Joint pain Muscle pain Nausea Pain, redness, or irritation at injection site This list may not describe all possible side effects. Call your doctor for medical advice about side effects. You may report side effects to FDA at 1-800-FDA-1088. Where should I keep my medication? This medication is given in a hospital or clinic. It will not be stored at home. NOTE: This sheet is a summary. It may not cover all possible information. If you have questions about this medicine, talk to your doctor, pharmacist, or health care provider.  2024 Elsevier/Gold Standard (2022-08-12 00:00:00)

## 2023-01-20 NOTE — Telephone Encounter (Signed)
Called patient to ask her if she can come earlier for her today's infusion appointment. Left message on her voicemail.

## 2023-01-24 ENCOUNTER — Encounter: Payer: Self-pay | Admitting: Family Medicine

## 2023-01-24 ENCOUNTER — Other Ambulatory Visit: Payer: Self-pay | Admitting: Neurology

## 2023-01-26 ENCOUNTER — Emergency Department (HOSPITAL_BASED_OUTPATIENT_CLINIC_OR_DEPARTMENT_OTHER)
Admission: EM | Admit: 2023-01-26 | Discharge: 2023-01-27 | Disposition: A | Discharge status: Home / Self Care | Attending: Emergency Medicine | Admitting: Emergency Medicine

## 2023-01-26 ENCOUNTER — Other Ambulatory Visit: Payer: Self-pay

## 2023-01-26 ENCOUNTER — Encounter (HOSPITAL_BASED_OUTPATIENT_CLINIC_OR_DEPARTMENT_OTHER): Payer: Self-pay | Admitting: Emergency Medicine

## 2023-01-26 DIAGNOSIS — R519 Headache, unspecified: Secondary | ICD-10-CM | POA: Diagnosis present

## 2023-01-26 DIAGNOSIS — Z7951 Long term (current) use of inhaled steroids: Secondary | ICD-10-CM | POA: Insufficient documentation

## 2023-01-26 DIAGNOSIS — J45909 Unspecified asthma, uncomplicated: Secondary | ICD-10-CM | POA: Insufficient documentation

## 2023-01-26 DIAGNOSIS — Z794 Long term (current) use of insulin: Secondary | ICD-10-CM | POA: Diagnosis not present

## 2023-01-26 DIAGNOSIS — G43909 Migraine, unspecified, not intractable, without status migrainosus: Secondary | ICD-10-CM | POA: Diagnosis not present

## 2023-01-26 LAB — COMPREHENSIVE METABOLIC PANEL
ALT: 27 U/L (ref 0–44)
AST: 29 U/L (ref 15–41)
Albumin: 4.1 g/dL (ref 3.5–5.0)
Alkaline Phosphatase: 43 U/L (ref 38–126)
Anion gap: 11 (ref 5–15)
BUN: 14 mg/dL (ref 6–20)
CO2: 20 mmol/L — ABNORMAL LOW (ref 22–32)
Calcium: 9.2 mg/dL (ref 8.9–10.3)
Chloride: 105 mmol/L (ref 98–111)
Creatinine, Ser: 1.13 mg/dL — ABNORMAL HIGH (ref 0.44–1.00)
GFR, Estimated: >60 mL/min (ref >60)
Glucose, Bld: 148 mg/dL — ABNORMAL HIGH (ref 70–99)
Potassium: 3.5 mmol/L (ref 3.5–5.1)
Sodium: 136 mmol/L (ref 135–145)
Total Bilirubin: 0.5 mg/dL (ref <1.2)
Total Protein: 7.3 g/dL (ref 6.5–8.1)

## 2023-01-26 LAB — CBC WITH DIFFERENTIAL/PLATELET
Abs Immature Granulocytes: 0.04 10*3/uL (ref 0.00–0.07)
Basophils Absolute: 0 10*3/uL (ref 0.0–0.1)
Basophils Relative: 0 %
Eosinophils Absolute: 0.2 10*3/uL (ref 0.0–0.5)
Eosinophils Relative: 2 %
HCT: 36.9 % (ref 36.0–46.0)
Hemoglobin: 11.9 g/dL — ABNORMAL LOW (ref 12.0–15.0)
Immature Granulocytes: 0 %
Lymphocytes Relative: 22 %
Lymphs Abs: 2.3 10*3/uL (ref 0.7–4.0)
MCH: 24 pg — ABNORMAL LOW (ref 26.0–34.0)
MCHC: 32.2 g/dL (ref 30.0–36.0)
MCV: 74.4 fL — ABNORMAL LOW (ref 80.0–100.0)
Monocytes Absolute: 0.6 10*3/uL (ref 0.1–1.0)
Monocytes Relative: 5 %
Neutro Abs: 7.5 10*3/uL (ref 1.7–7.7)
Neutrophils Relative %: 71 %
Platelets: 460 10*3/uL — ABNORMAL HIGH (ref 150–400)
RBC: 4.96 MIL/uL (ref 3.87–5.11)
RDW: 17.7 % — ABNORMAL HIGH (ref 11.5–15.5)
WBC: 10.6 10*3/uL — ABNORMAL HIGH (ref 4.0–10.5)
nRBC: 0 % (ref 0.0–0.2)

## 2023-01-26 LAB — LIPASE, BLOOD: Lipase: 24 U/L (ref 11–51)

## 2023-01-26 MED ORDER — DROPERIDOL 2.5 MG/ML IJ SOLN
2.5000 mg | Freq: Once | INTRAMUSCULAR | Status: AC
  Administered 2023-01-26: 2.5 mg via INTRAVENOUS
  Filled 2023-01-26: qty 2

## 2023-01-26 MED ORDER — DEXAMETHASONE SODIUM PHOSPHATE 4 MG/ML IJ SOLN
4.0000 mg | Freq: Once | INTRAMUSCULAR | Status: AC
  Administered 2023-01-26: 4 mg via INTRAVENOUS
  Filled 2023-01-26: qty 1

## 2023-01-26 MED ORDER — DIPHENHYDRAMINE HCL 50 MG/ML IJ SOLN
25.0000 mg | Freq: Once | INTRAMUSCULAR | Status: AC
  Administered 2023-01-26: 25 mg via INTRAVENOUS
  Filled 2023-01-26: qty 1

## 2023-01-26 MED ORDER — MAGNESIUM SULFATE 2 GM/50ML IV SOLN
2.0000 g | Freq: Once | INTRAVENOUS | Status: AC
  Administered 2023-01-26: 2 g via INTRAVENOUS
  Filled 2023-01-26: qty 50

## 2023-01-26 MED ORDER — ONDANSETRON 4 MG PO TBDP
4.0000 mg | ORAL_TABLET | Freq: Once | ORAL | Status: DC | PRN
  Filled 2023-01-26: qty 1

## 2023-01-26 NOTE — ED Notes (Signed)
Patient continues to have migraine symptoms. Patient took her prescribed Nurtec today in which gave enough relief for a short nap.

## 2023-01-26 NOTE — ED Notes (Signed)
Pt reports taking 8 mg zofran appx 2 hr PTA.

## 2023-01-26 NOTE — ED Triage Notes (Signed)
PtPOV steady gait- c/o migraine since 0800 today, hx of same. Took prescribed Nurtec, ineffective.   Reports n/v.

## 2023-01-27 ENCOUNTER — Inpatient Hospital Stay

## 2023-01-27 ENCOUNTER — Encounter: Payer: Self-pay | Admitting: Neurology

## 2023-01-27 ENCOUNTER — Other Ambulatory Visit: Payer: Self-pay | Admitting: Neurology

## 2023-01-27 VITALS — BP 105/65 | HR 84 | Temp 97.8°F | Resp 20

## 2023-01-27 DIAGNOSIS — D509 Iron deficiency anemia, unspecified: Secondary | ICD-10-CM

## 2023-01-27 MED ORDER — ONDANSETRON HCL 8 MG PO TABS: 8.0000 mg | ORAL_TABLET | ORAL | 1 refills | Status: DC | PRN

## 2023-01-27 MED ORDER — SODIUM CHLORIDE 0.9 % IV SOLN
300.0000 mg | Freq: Once | INTRAVENOUS | Status: AC
  Administered 2023-01-27: 300 mg via INTRAVENOUS
  Filled 2023-01-27: qty 300

## 2023-01-27 MED ORDER — SODIUM CHLORIDE 0.9 % IV SOLN: INTRAVENOUS | Status: DC

## 2023-01-27 MED ORDER — SODIUM CHLORIDE 0.9 % IV BOLUS
500.0000 mL | Freq: Once | INTRAVENOUS | Status: AC
Start: 2023-01-27 — End: 2023-01-27
  Administered 2023-01-27: 500 mL via INTRAVENOUS

## 2023-01-27 NOTE — Patient Instructions (Signed)
Iron Sucrose Injection What is this medication? IRON SUCROSE (EYE ern SOO krose) treats low levels of iron (iron deficiency anemia) in people with kidney disease. Iron is a mineral that plays an important role in making red blood cells, which carry oxygen from your lungs to the rest of your body. This medicine may be used for other purposes; ask your health care provider or pharmacist if you have questions. COMMON BRAND NAME(S): Venofer What should I tell my care team before I take this medication? They need to know if you have any of these conditions: Anemia not caused by low iron levels Heart disease High levels of iron in the blood Kidney disease Liver disease An unusual or allergic reaction to iron, other medications, foods, dyes, or preservatives Pregnant or trying to get pregnant Breastfeeding How should I use this medication? This medication is for infusion into a vein. It is given in a hospital or clinic setting. Talk to your care team about the use of this medication in children. While this medication may be prescribed for children as young as 2 years for selected conditions, precautions do apply. Overdosage: If you think you have taken too much of this medicine contact a poison control center or emergency room at once. NOTE: This medicine is only for you. Do not share this medicine with others. What if I miss a dose? Keep appointments for follow-up doses. It is important not to miss your dose. Call your care team if you are unable to keep an appointment. What may interact with this medication? Do not take this medication with any of the following: Deferoxamine Dimercaprol Other iron products This medication may also interact with the following: Chloramphenicol Deferasirox This list may not describe all possible interactions. Give your health care provider a list of all the medicines, herbs, non-prescription drugs, or dietary supplements you use. Also tell them if you smoke,  drink alcohol, or use illegal drugs. Some items may interact with your medicine. What should I watch for while using this medication? Visit your care team regularly. Tell your care team if your symptoms do not start to get better or if they get worse. You may need blood work done while you are taking this medication. You may need to follow a special diet. Talk to your care team. Foods that contain iron include: whole grains/cereals, dried fruits, beans, or peas, leafy green vegetables, and organ meats (liver, kidney). What side effects may I notice from receiving this medication? Side effects that you should report to your care team as soon as possible: Allergic reactions--skin rash, itching, hives, swelling of the face, lips, tongue, or throat Low blood pressure--dizziness, feeling faint or lightheaded, blurry vision Shortness of breath Side effects that usually do not require medical attention (report to your care team if they continue or are bothersome): Flushing Headache Joint pain Muscle pain Nausea Pain, redness, or irritation at injection site This list may not describe all possible side effects. Call your doctor for medical advice about side effects. You may report side effects to FDA at 1-800-FDA-1088. Where should I keep my medication? This medication is given in a hospital or clinic. It will not be stored at home. NOTE: This sheet is a summary. It may not cover all possible information. If you have questions about this medicine, talk to your doctor, pharmacist, or health care provider.  2024 Elsevier/Gold Standard (2022-08-12 00:00:00)

## 2023-01-27 NOTE — ED Provider Notes (Signed)
EMERGENCY DEPARTMENT AT [facility] Provider Note   CSN: [id number] Arrival date & time: 01/26/23  1854     History  Chief Complaint  Patient presents with   Headache   Nausea    Amber [name] is a 42 y.o. female.  The history is provided by the patient.  Headache Quality:  Dull Radiates to:  Does not radiate Onset quality:  Gradual Duration:  1 day Timing:  Constant Chronicity:  Recurrent Similar to prior headaches: yes   Context: bright light   Relieved by:  Nothing Worsened by:  Nothing Ineffective treatments:  None tried Associated symptoms: nausea   Associated symptoms: no fever, no neck stiffness, no numbness and no visual change   Risk factors: no anger   Patient with migraines and depression presents with one day of her typical migraine.  Took nurtec without relief and came to the ED for migraine cocktail.      Past Medical History:  Diagnosis Date   Anxiety    Asthma    Depression    GERD (gastroesophageal reflux disease)    History of chicken pox    History of migraine headaches    Migraines    Pituitary adenoma (HCC)    Seizures (HCC)      Home Medications Prior to Admission medications   Medication Sig Start Date End Date Taking? Authorizing Provider  acetaminophen (TYLENOL) 500 MG tablet Take 500 mg by mouth as needed.    [provider]  cabergoline (DOSTINEX) 0.5 MG tablet 2.5 mg weekly (Two tabs on Mondays, 1 tab on wednesdays and 2 tabs on Fridays) 01/09/23   Shamleffer, Konrad Dolores, MD  Continuous Glucose Sensor (DEXCOM G7 SENSOR) MISC Use daily to check blood sugar,  DXE11.65 12/30/22   Sharlene Dory, DO  escitalopram (LEXAPRO) 10 MG tablet TAKE ONE TABLET BY MOUTH EVERY DAY 09/05/22   Sharlene Dory, DO  famotidine (PEPCID) 20 MG tablet Take 20 mg by mouth 2 (two) times daily.    [provider]  famotidine (PEPCID) 20 MG tablet Take 1 tablet (20 mg total) by mouth 2 (two)  times daily. 11/06/21   Elley Harp, MD  fenofibrate (TRICOR) 145 MG tablet Take 1 tablet (145 mg total) by mouth daily. 09/01/22   Wendling, Jilda Roche, DO  Galcanezumab-gnlm (EMGALITY) 120 MG/ML SOAJ Inject 120 mg into the skin every 21 ( twenty-one) days. 01/06/23   Everlena Cooper, Adam R, DO  glucose blood (ACCU-CHEK GUIDE) test strip 1 each by Other route daily in the afternoon. Use as instructed 01/06/23   Shamleffer, Konrad Dolores, MD  hydrOXYzine (ATARAX/VISTARIL) 25 MG tablet TAKE 1-3 TABLETS (25-75 MG TOTAL) BY MOUTH 3 (THREE) TIMES DAILY AS NEEDED FOR ANXIETY. 01/22/18   Sharlene Dory, DO  ketoconazole (NIZORAL) 2 % cream Apply 1 Application topically daily. 12/28/22 02/08/23  Sharlene Dory, DO  metformin (FORTAMET) 1000 MG (OSM) 24 hr tablet Take 1 tablet (1,000 mg total) by mouth 2 (two) times daily with a meal. 01/06/23   Shamleffer, Konrad Dolores, MD  ondansetron (ZOFRAN) 8 MG tablet Take 1 tablet (8 mg total) by mouth as needed for nausea or vomiting. 06/28/21   Everlena Cooper, Adam R, DO  pantoprazole (PROTONIX) 40 MG tablet Take 1 tablet (40 mg total) by mouth 2 (two) times daily. 09/01/22   Sharlene Dory, DO  predniSONE (DELTASONE) 20 MG tablet 2 tabs po daily x 4 days Patient not taking: Reported on 01/06/2023 11/06/21  Jahmal Dunavant, MD  promethazine (PHENERGAN) 25 MG tablet Take 1 tablet (25 mg total) by mouth every 8 (eight) hours as needed for nausea or vomiting. 01/12/23   Tilden Fossa, MD  QUEtiapine (SEROQUEL) 300 MG tablet Take 1 tablet (300 mg total) by mouth at bedtime. 09/01/22   Sharlene Dory, DO  Riboflavin 400 MG CAPS Take 400 mg by mouth daily.    [provider]  Rimegepant Sulfate (NURTEC) 75 MG TBDP Take 1 tablet (75 mg total) by mouth as needed (Daily as needed for a Migraine). Maximum 1 tablet in 24 hours.  Quantity 8. 06/22/22   Everlena Cooper, Adam R, DO  rosuvastatin (CRESTOR) 20 MG tablet Take 1 tablet (20 mg total) by mouth daily.  09/01/22   Sharlene Dory, DO  tirzepatide [facility]) 5 MG/0.5ML Pen Inject 5 mg into the skin once a week for 28 days. 01/25/23 02/22/23  Sharlene Dory, DO  tirzepatide [facility]) 7.5 MG/0.5ML Pen Inject 7.5 mg into the skin once a week for 28 days. 02/22/23 03/22/23  Sharlene Dory, DO  tiZANidine (ZANAFLEX) 4 MG tablet Take 1 tablet (4 mg total) by mouth every 6 (six) hours as needed for muscle spasms. 11/24/21   Sharlene Dory, DO  topiramate (TOPAMAX) 100 MG tablet Take 1 tablet (100 mg total) by mouth at bedtime. 01/06/23   Drema Dallas, DO  traMADol (ULTRAM) 50 MG tablet TAKE ONE TABLET BY MOUTH EVERY 6 HOURS AS NEEDED 01/24/23   Drema Dallas, DO  Vitamin D, Ergocalciferol, (DRISDOL) 1.25 MG (50000 UNIT) CAPS capsule Take 1 capsule (50,000 Units total) by mouth every 7 (seven) days. 11/25/21   Sharlene Dory, DO      Allergies    Aspirin, Beeswax, Chlorhexidine, Ibuprofen, Iodine, Lunesta [eszopiclone], Nsaids, Shellfish allergy, Sulfa antibiotics, Triptans, and Tape    Review of Systems   Review of Systems  Constitutional:  Negative for fever.  HENT:  Negative for facial swelling.   Respiratory:  Negative for wheezing.   Gastrointestinal:  Positive for nausea.  Musculoskeletal:  Negative for neck stiffness.  Neurological:  Positive for headaches. Negative for numbness.  All other systems reviewed and are negative.   Physical Exam Updated Vital Signs BP (!) 101/56   Pulse 80   Temp 97.9 F (36.6 C) (Oral)   Resp 15   Ht 5\' 6"  (1.676 m)   Wt 124.1 kg   LMP 01/26/2023   SpO2 97%   BMI 44.14 kg/m  Physical Exam Vitals and nursing note reviewed.  Constitutional:      General: She is not in acute distress.    Appearance: Normal appearance. She is well-developed.  HENT:     Head: Normocephalic and atraumatic.     Nose: Nose normal.  Eyes:     Extraocular Movements: Extraocular movements intact.     Pupils: Pupils are equal, round,  and reactive to light.  Cardiovascular:     Rate and Rhythm: Normal rate and regular rhythm.     Pulses: Normal pulses.     Heart sounds: Normal heart sounds.  Pulmonary:     Effort: Pulmonary effort is normal. No respiratory distress.     Breath sounds: Normal breath sounds.  Abdominal:     General: Bowel sounds are normal. There is no distension.     Palpations: Abdomen is soft.     Tenderness: There is no abdominal tenderness. There is no guarding or rebound.  Musculoskeletal:  General: Normal range of motion.     Cervical back: Normal range of motion and neck supple. No rigidity.  Skin:    General: Skin is warm and dry.     Capillary Refill: Capillary refill takes less than 2 seconds.     Findings: No erythema or rash.  Neurological:     General: No focal deficit present.     Mental Status: She is oriented to person, place, and time.     Deep Tendon Reflexes: Reflexes normal.  Psychiatric:        Mood and Affect: Mood normal.        Behavior: Behavior normal.     ED Results / Procedures / Treatments   Labs (all labs ordered are listed, but only abnormal results are displayed) Results for orders placed or performed during the hospital encounter of 01/26/23  CBC with Differential  Result Value Ref Range   WBC 10.6 (H) 4.0 - 10.5 K/uL   RBC 4.96 3.87 - 5.11 MIL/uL   Hemoglobin 11.9 (L) 12.0 - 15.0 g/dL   HCT 16.1 09.6 - 04.5 %   MCV 74.4 (L) 80.0 - 100.0 fL   MCH 24.0 (L) 26.0 - 34.0 pg   MCHC 32.2 30.0 - 36.0 g/dL   RDW 40.9 (H) 81.1 - 91.4 %   Platelets 460 (H) 150 - 400 K/uL   nRBC 0.0 0.0 - 0.2 %   Neutrophils Relative % 71 %   Neutro Abs 7.5 1.7 - 7.7 K/uL   Lymphocytes Relative 22 %   Lymphs Abs 2.3 0.7 - 4.0 K/uL   Monocytes Relative 5 %   Monocytes Absolute 0.6 0.1 - 1.0 K/uL   Eosinophils Relative 2 %   Eosinophils Absolute 0.2 0.0 - 0.5 K/uL   Basophils Relative 0 %   Basophils Absolute 0.0 0.0 - 0.1 K/uL   Immature Granulocytes 0 %   Abs  Immature Granulocytes 0.04 0.00 - 0.07 K/uL  Comprehensive metabolic panel  Result Value Ref Range   Sodium 136 135 - 145 mmol/L   Potassium 3.5 3.5 - 5.1 mmol/L   Chloride 105 98 - 111 mmol/L   CO2 20 (L) 22 - 32 mmol/L   Glucose, Bld 148 (H) 70 - 99 mg/dL   BUN 14 6 - 20 mg/dL   Creatinine, Ser 7.82 (H) 0.44 - 1.00 mg/dL   Calcium 9.2 8.9 - 95.6 mg/dL   Total Protein 7.3 6.5 - 8.1 g/dL   Albumin 4.1 3.5 - 5.0 g/dL   AST 29 15 - 41 U/L   ALT 27 0 - 44 U/L   Alkaline Phosphatase 43 38 - 126 U/L   Total Bilirubin 0.5 <1.2 mg/dL   GFR, Estimated >21 >30 mL/min   Anion gap 11 5 - 15  Lipase, blood  Result Value Ref Range   Lipase 24 11 - 51 U/L   No results found.  Radiology No results found.  Procedures Procedures    Medications Ordered in ED Medications  ondansetron (ZOFRAN-ODT) disintegrating tablet 4 mg (has no administration in time range)  diphenhydrAMINE (BENADRYL) injection 25 mg (25 mg Intravenous Given 01/26/23 2319)  magnesium sulfate IVPB 2 g 50 mL (0 g Intravenous Stopped 01/27/23 0042)  droperidol (INAPSINE) 2.5 MG/ML injection 2.5 mg (2.5 mg Intravenous Given 01/26/23 2327)  dexamethasone (DECADRON) injection 4 mg (4 mg Intravenous Given 01/26/23 2318)  sodium chloride 0.9 % bolus 500 mL (0 mLs Intravenous Stopped 01/27/23 0118)    ED Course/ Medical Decision  Making/ A&P                                 Medical Decision Making Patient with typical migraine today not relieved   Amount and/or Complexity of Data Reviewed External Data Reviewed: notes.    Details: Previous notes reviewed  Labs: ordered.    Details: White count top normal 10.6, hemoglobin slight low 11.9, platelets elevated 460.  Lipase normal 24, normal sodium 136, normal potassium 3.5 normal creatinine   Risk Prescription drug management. Risk Details: Migraine cocktail given.  Sleeping soundly post medication.  No atypia.  No indication for repeat imaging.  Stable for discharge with close  follow up.      Final Clinical Impression(s) / ED Diagnoses Final diagnoses:  Migraine without status migrainosus, not intractable, unspecified migraine type   Return for intractable cough, coughing up blood, fevers > 100.4 unrelieved by medication, shortness of breath, intractable vomiting, chest pain, shortness of breath, weakness, numbness, changes in speech, facial asymmetry, abdominal pain, passing out, Inability to tolerate liquids or food, cough, altered mental status or any concerns. No signs of systemic illness or infection. The patient is nontoxic-appearing on exam and vital signs are within normal limits.  I have reviewed the triage vital signs and the nursing notes. Pertinent labs & imaging results that were available during my care of the patient were reviewed by me and considered in my medical decision making (see chart for details). After history, exam, and medical workup I feel the patient has been appropriately medically screened and is safe for discharge home. Pertinent diagnoses were discussed with the patient. Patient was given return precautions.  Rx / DC Orders ED Discharge Orders     None         Vikrant Pryce, MD 01/27/23 8469

## 2023-01-30 ENCOUNTER — Ambulatory Visit (INDEPENDENT_AMBULATORY_CARE_PROVIDER_SITE_OTHER): Admitting: Family Medicine

## 2023-01-30 ENCOUNTER — Encounter: Payer: Self-pay | Admitting: Family Medicine

## 2023-01-30 VITALS — BP 110/64 | HR 115 | Temp 98.0°F | Resp 16 | Ht 66.0 in | Wt 276.8 lb

## 2023-01-30 DIAGNOSIS — R000 Tachycardia, unspecified: Secondary | ICD-10-CM | POA: Diagnosis not present

## 2023-01-30 MED ORDER — DEXCOM G7 SENSOR MISC: 11 refills | Status: DC

## 2023-01-30 NOTE — Progress Notes (Signed)
Chief Complaint  Patient presents with   Dizziness    Discuss Dizziness     Subjective: Patient is a 42 y.o. female here for f/u.  Over the past several days, the patient has been getting up and been very dizzy and her heart has been racing.  She notes that she read a lot about POTS and thinks she may have it.  She did a makeshift home tilt table test and her heart rate prior to elevation was 82 and after elevation it was 131.  She walks for exercise.  She tries to stay well-hydrated.  She does not wear compression stockings but did order them.  She had an echo done a couple years ago that was unremarkable.  She is taking cabergoline for her pituitary adenoma.  She has not lost consciousness.  She suspects this has actually been going on for couple years now.  Past Medical History:  Diagnosis Date   Anxiety    Asthma    Depression    GERD (gastroesophageal reflux disease)    History of chicken pox    History of migraine headaches    Migraines    Pituitary adenoma (HCC)    Seizures (HCC)     Objective: BP 110/64 (BP Location: Left Arm, Patient Position: Sitting, Cuff Size: Large)   Pulse (!) 115   Temp 98 F (36.7 C) (Oral)   Resp 16   Ht 5\' 6"  (1.676 m)   Wt 276 lb 12.8 oz (125.6 kg)   LMP 01/26/2023   SpO2 96%   BMI 44.68 kg/m  General: Awake, appears stated age Heart: Tachycardic, regular rhythm, no LE edema Lungs: CTAB, no rales, wheezes or rhonchi. No accessory muscle use Psych: Age appropriate judgment and insight, normal affect and mood  Assessment and Plan: Tachycardia - Plan: Ambulatory referral to Cardiac Electrophysiology  Will refer to cardiology EP for their evaluation.  Consider compression stockings, continue with electrolyte intake, stay hydrated, recumbent exercise recommended.  Follow-up as originally scheduled. The patient voiced understanding and agreement to the plan.  Jilda Roche [place], DO 01/30/23  4:59 PM

## 2023-01-30 NOTE — Patient Instructions (Addendum)
Consider compression stockings.   Stay hydrated.  Weak data for salt tabs, some docs aren't recommending anymore.   If you do not hear anything about your referral in the next 1-2 weeks, call our office and ask for an update.  Let us know if you need anything.

## 2023-02-03 ENCOUNTER — Inpatient Hospital Stay

## 2023-02-03 VITALS — BP 89/57 | HR 84 | Temp 98.4°F | Resp 18

## 2023-02-03 DIAGNOSIS — D509 Iron deficiency anemia, unspecified: Secondary | ICD-10-CM

## 2023-02-03 MED ORDER — SODIUM CHLORIDE 0.9 % IV SOLN: INTRAVENOUS | Status: DC

## 2023-02-03 MED ORDER — SODIUM CHLORIDE 0.9 % IV SOLN
300.0000 mg | Freq: Once | INTRAVENOUS | Status: AC
  Administered 2023-02-03: 300 mg via INTRAVENOUS
  Filled 2023-02-03: qty 300

## 2023-02-03 NOTE — Progress Notes (Signed)
Patient refused to wait 30 minutes post infusion. Released stable and ASX. 

## 2023-02-03 NOTE — Patient Instructions (Signed)
Iron Sucrose Injection What is this medication? IRON SUCROSE (EYE ern SOO krose) treats low levels of iron (iron deficiency anemia) in people with kidney disease. Iron is a mineral that plays an important role in making red blood cells, which carry oxygen from your lungs to the rest of your body. This medicine may be used for other purposes; ask your health care provider or pharmacist if you have questions. COMMON BRAND NAME(S): Venofer What should I tell my care team before I take this medication? They need to know if you have any of these conditions: Anemia not caused by low iron levels Heart disease High levels of iron in the blood Kidney disease Liver disease An unusual or allergic reaction to iron, other medications, foods, dyes, or preservatives Pregnant or trying to get pregnant Breastfeeding How should I use this medication? This medication is for infusion into a vein. It is given in a hospital or clinic setting. Talk to your care team about the use of this medication in children. While this medication may be prescribed for children as young as 2 years for selected conditions, precautions do apply. Overdosage: If you think you have taken too much of this medicine contact a poison control center or emergency room at once. NOTE: This medicine is only for you. Do not share this medicine with others. What if I miss a dose? Keep appointments for follow-up doses. It is important not to miss your dose. Call your care team if you are unable to keep an appointment. What may interact with this medication? Do not take this medication with any of the following: Deferoxamine Dimercaprol Other iron products This medication may also interact with the following: Chloramphenicol Deferasirox This list may not describe all possible interactions. Give your health care provider a list of all the medicines, herbs, non-prescription drugs, or dietary supplements you use. Also tell them if you smoke,  drink alcohol, or use illegal drugs. Some items may interact with your medicine. What should I watch for while using this medication? Visit your care team regularly. Tell your care team if your symptoms do not start to get better or if they get worse. You may need blood work done while you are taking this medication. You may need to follow a special diet. Talk to your care team. Foods that contain iron include: whole grains/cereals, dried fruits, beans, or peas, leafy green vegetables, and organ meats (liver, kidney). What side effects may I notice from receiving this medication? Side effects that you should report to your care team as soon as possible: Allergic reactions--skin rash, itching, hives, swelling of the face, lips, tongue, or throat Low blood pressure--dizziness, feeling faint or lightheaded, blurry vision Shortness of breath Side effects that usually do not require medical attention (report to your care team if they continue or are bothersome): Flushing Headache Joint pain Muscle pain Nausea Pain, redness, or irritation at injection site This list may not describe all possible side effects. Call your doctor for medical advice about side effects. You may report side effects to FDA at 1-800-FDA-1088. Where should I keep my medication? This medication is given in a hospital or clinic. It will not be stored at home. NOTE: This sheet is a summary. It may not cover all possible information. If you have questions about this medicine, talk to your doctor, pharmacist, or health care provider.  2024 Elsevier/Gold Standard (2022-08-12 00:00:00)

## 2023-02-08 ENCOUNTER — Other Ambulatory Visit: Payer: Self-pay

## 2023-02-08 ENCOUNTER — Encounter (HOSPITAL_BASED_OUTPATIENT_CLINIC_OR_DEPARTMENT_OTHER): Payer: Self-pay

## 2023-02-08 ENCOUNTER — Ambulatory Visit: Admitting: Family Medicine

## 2023-02-08 ENCOUNTER — Emergency Department (HOSPITAL_BASED_OUTPATIENT_CLINIC_OR_DEPARTMENT_OTHER)

## 2023-02-08 ENCOUNTER — Emergency Department (HOSPITAL_BASED_OUTPATIENT_CLINIC_OR_DEPARTMENT_OTHER): Admission: EM | Admit: 2023-02-08 | Discharge: 2023-02-08 | Disposition: A | Discharge status: Home / Self Care

## 2023-02-08 DIAGNOSIS — R0789 Other chest pain: Secondary | ICD-10-CM | POA: Diagnosis not present

## 2023-02-08 DIAGNOSIS — Z794 Long term (current) use of insulin: Secondary | ICD-10-CM | POA: Insufficient documentation

## 2023-02-08 DIAGNOSIS — J45909 Unspecified asthma, uncomplicated: Secondary | ICD-10-CM | POA: Insufficient documentation

## 2023-02-08 DIAGNOSIS — R42 Dizziness and giddiness: Secondary | ICD-10-CM | POA: Diagnosis not present

## 2023-02-08 DIAGNOSIS — R000 Tachycardia, unspecified: Secondary | ICD-10-CM | POA: Diagnosis present

## 2023-02-08 DIAGNOSIS — Z7951 Long term (current) use of inhaled steroids: Secondary | ICD-10-CM | POA: Insufficient documentation

## 2023-02-08 LAB — CBC
HCT: 37.7 % (ref 36.0–46.0)
Hemoglobin: 12.1 g/dL (ref 12.0–15.0)
MCH: 24.5 pg — ABNORMAL LOW (ref 26.0–34.0)
MCHC: 32.1 g/dL (ref 30.0–36.0)
MCV: 76.3 fL — ABNORMAL LOW (ref 80.0–100.0)
Platelets: 435 10*3/uL — ABNORMAL HIGH (ref 150–400)
RBC: 4.94 MIL/uL (ref 3.87–5.11)
RDW: 19.9 % — ABNORMAL HIGH (ref 11.5–15.5)
WBC: 10.2 10*3/uL (ref 4.0–10.5)
nRBC: 0 % (ref 0.0–0.2)

## 2023-02-08 LAB — BASIC METABOLIC PANEL
Anion gap: 7 (ref 5–15)
BUN: 14 mg/dL (ref 6–20)
CO2: 20 mmol/L — ABNORMAL LOW (ref 22–32)
Calcium: 9.1 mg/dL (ref 8.9–10.3)
Chloride: 111 mmol/L (ref 98–111)
Creatinine, Ser: 1.15 mg/dL — ABNORMAL HIGH (ref 0.44–1.00)
GFR, Estimated: >60 mL/min (ref >60)
Glucose, Bld: 106 mg/dL — ABNORMAL HIGH (ref 70–99)
Potassium: 3.6 mmol/L (ref 3.5–5.1)
Sodium: 138 mmol/L (ref 135–145)

## 2023-02-08 LAB — TROPONIN I (HIGH SENSITIVITY)
Troponin I (High Sensitivity): <2 ng/L (ref <18)
Troponin I (High Sensitivity): <2 ng/L (ref <18)

## 2023-02-08 LAB — PREGNANCY, URINE: Preg Test, Ur: NEGATIVE

## 2023-02-08 LAB — LACTIC ACID, PLASMA: Lactic Acid, Venous: 0.8 mmol/L (ref 0.5–1.9)

## 2023-02-08 LAB — D-DIMER, QUANTITATIVE: D-Dimer, Quant: 0.72 ug{FEU}/mL — ABNORMAL HIGH (ref 0.00–0.50)

## 2023-02-08 MED ORDER — DIPHENHYDRAMINE HCL 25 MG PO CAPS
25.0000 mg | ORAL_CAPSULE | Freq: Once | ORAL | Status: AC
  Administered 2023-02-08: 25 mg via ORAL
  Filled 2023-02-08: qty 1

## 2023-02-08 MED ORDER — SODIUM CHLORIDE 0.9 % IV BOLUS
1000.0000 mL | Freq: Once | INTRAVENOUS | Status: AC
  Administered 2023-02-08: 1000 mL via INTRAVENOUS

## 2023-02-08 MED ORDER — ONDANSETRON 4 MG PO TBDP
8.0000 mg | ORAL_TABLET | Freq: Once | ORAL | Status: AC
  Administered 2023-02-08: 8 mg via ORAL
  Filled 2023-02-08: qty 2

## 2023-02-08 MED ORDER — IOHEXOL 350 MG/ML SOLN
100.0000 mL | Freq: Once | INTRAVENOUS | Status: AC | PRN
  Administered 2023-02-08: 100 mL via INTRAVENOUS

## 2023-02-08 NOTE — ED Notes (Signed)
Pt to CT

## 2023-02-08 NOTE — ED Provider Notes (Signed)
[place] [facility] AT [facility] Provider Note   CSN: [id number] Arrival date & time: 02/08/23  1452     History  Chief Complaint  Patient presents with   Tachycardia    Amber [name] is a 42 y.o. female.  42 year old female with past medical history of seizures, asthma, GERD, and pituitary adenoma presents the emergency department today with concern for chest pressure and tachycardia.  The patient states that she has been having intermittent episodes throughout the day today with tachycardia and lightheadedness.  The patient states that she is currently being worked up by her primary care provider and has been referred to cardiology for similar symptoms.  She reports that her symptoms are worse today with sweats came to the ER.  She states she is having pressure in the center of her chest.  Does not radiate.  She denies any significant cough.  She states that she did have some nausea earlier today that is since resolved.  She denies any significant abdominal pain.  She reports she also was recently started on iron supplements for iron deficiency anemia.  She denies any blood in her stool or dark stools.  She has had multiple tests performed as an outpatient in regards to her pituitary adenoma as well as her thyroid studies which have thus far been negative.  She was referred to cardiology as her primary care provider thinks that she may have POTS.        Home Medications Prior to Admission medications   Medication Sig Start Date End Date Taking? Authorizing Provider  acetaminophen (TYLENOL) 500 MG tablet Take 500 mg by mouth as needed.    [provider]  cabergoline (DOSTINEX) 0.5 MG tablet 2.5 mg weekly (Two tabs on Mondays, 1 tab on wednesdays and 2 tabs on Fridays) 01/09/23   Shamleffer, Konrad Dolores, MD  Continuous Glucose Sensor (DEXCOM G7 SENSOR) MISC Use daily to check blood sugar,  DXE11.65 01/30/23   Sharlene Dory, DO   escitalopram (LEXAPRO) 10 MG tablet TAKE ONE TABLET BY MOUTH EVERY DAY 09/05/22   Sharlene Dory, DO  famotidine (PEPCID) 20 MG tablet Take 20 mg by mouth 2 (two) times daily.    [provider]  famotidine (PEPCID) 20 MG tablet Take 1 tablet (20 mg total) by mouth 2 (two) times daily. 11/06/21   Palumbo, April, MD  fenofibrate (TRICOR) 145 MG tablet Take 1 tablet (145 mg total) by mouth daily. 09/01/22   Sharlene Dory, DO  gabapentin (NEURONTIN) 300 MG capsule Take 1 capsule by mouth at bedtime. 01/25/23   [provider]  Galcanezumab-gnlm (EMGALITY) 120 MG/ML SOAJ Inject 120 mg into the skin every 21 ( twenty-one) days. 01/06/23   Everlena Cooper, Adam R, DO  glucose blood (ACCU-CHEK GUIDE) test strip 1 each by Other route daily in the afternoon. Use as instructed 01/06/23   Shamleffer, Konrad Dolores, MD  hydrOXYzine (ATARAX/VISTARIL) 25 MG tablet TAKE 1-3 TABLETS (25-75 MG TOTAL) BY MOUTH 3 (THREE) TIMES DAILY AS NEEDED FOR ANXIETY. 01/22/18   Sharlene Dory, DO  ketoconazole (NIZORAL) 2 % cream Apply 1 Application topically daily. 12/28/22 02/08/23  Sharlene Dory, DO  metformin (FORTAMET) 1000 MG (OSM) 24 hr tablet Take 1 tablet (1,000 mg total) by mouth 2 (two) times daily with a meal. 01/06/23   Shamleffer, Konrad Dolores, MD  ondansetron (ZOFRAN) 8 MG tablet Take 1 tablet (8 mg total) by mouth as needed for nausea or vomiting. 01/27/23  Everlena Cooper, Adam R, DO  pantoprazole (PROTONIX) 40 MG tablet Take 1 tablet (40 mg total) by mouth 2 (two) times daily. 09/01/22   Sharlene Dory, DO  predniSONE (DELTASONE) 20 MG tablet 2 tabs po daily x 4 days 11/06/21   Nicanor Alcon, April, MD  promethazine (PHENERGAN) 25 MG tablet Take 1 tablet (25 mg total) by mouth every 8 (eight) hours as needed for nausea or vomiting. 01/12/23   Tilden Fossa, MD  QUEtiapine (SEROQUEL) 300 MG tablet Take 1 tablet (300 mg total) by mouth at bedtime. 09/01/22   Sharlene Dory, DO  Riboflavin 400 MG CAPS Take 400 mg by mouth daily.    [provider]  Rimegepant Sulfate (NURTEC) 75 MG TBDP Take 1 tablet (75 mg total) by mouth as needed (Daily as needed for a Migraine). Maximum 1 tablet in 24 hours.  Quantity 8. 06/22/22   Everlena Cooper, Adam R, DO  rosuvastatin (CRESTOR) 20 MG tablet Take 1 tablet (20 mg total) by mouth daily. 09/01/22   Sharlene Dory, DO  tirzepatide [facility]) 5 MG/0.5ML Pen Inject 5 mg into the skin once a week for 28 days. 01/25/23 02/22/23  Sharlene Dory, DO  tirzepatide [facility]) 7.5 MG/0.5ML Pen Inject 7.5 mg into the skin once a week for 28 days. 02/22/23 03/22/23  Sharlene Dory, DO  tiZANidine (ZANAFLEX) 4 MG tablet Take 1 tablet (4 mg total) by mouth every 6 (six) hours as needed for muscle spasms. 11/24/21   Sharlene Dory, DO  topiramate (TOPAMAX) 100 MG tablet Take 1 tablet (100 mg total) by mouth at bedtime. 01/06/23   Drema Dallas, DO  traMADol (ULTRAM) 50 MG tablet TAKE ONE TABLET BY MOUTH EVERY 6 HOURS AS NEEDED 01/24/23   Drema Dallas, DO  Vitamin D, Ergocalciferol, (DRISDOL) 1.25 MG (50000 UNIT) CAPS capsule Take 1 capsule (50,000 Units total) by mouth every 7 (seven) days. 11/25/21   Sharlene Dory, DO      Allergies    Aspirin, Beeswax, Chlorhexidine, Ibuprofen, Iodine, Lunesta [eszopiclone], Nsaids, Shellfish allergy, Sulfa antibiotics, Triptans, and Tape    Review of Systems   Review of Systems  Respiratory:  Positive for chest tightness.   Cardiovascular:  Positive for palpitations.  Neurological:  Positive for light-headedness.  All other systems reviewed and are negative.   Physical Exam Updated Vital Signs BP 94/64   Pulse 82   Temp 98.1 F (36.7 C) (Oral)   Resp 16   Ht 5\' 6"  (1.676 m)   Wt 123.8 kg   LMP 01/26/2023   SpO2 98%   BMI 44.06 kg/m  Physical Exam Vitals and nursing note reviewed.   Gen: NAD Eyes: PERRL, EOMI HEENT: no oropharyngeal swelling Neck:  trachea midline Resp: clear to auscultation bilaterally Card: RRR, no murmurs, rubs, or gallops Abd: nontender, nondistended Extremities: no calf tenderness, no edema Vascular: 2+ radial pulses bilaterally, 2+ DP pulses bilaterally Neuro: No focal deficits Skin: no rashes Psyc: acting appropriately   ED Results / Procedures / Treatments   Labs (all labs ordered are listed, but only abnormal results are displayed) Labs Reviewed  BASIC METABOLIC PANEL - Abnormal; Notable for the following components:      Result Value   CO2 20 (*)    Glucose, Bld 106 (*)    Creatinine, Ser 1.15 (*)    All other components within normal limits  CBC - Abnormal; Notable for the following components:   MCV 76.3 (*)    [facility]  24.5 (*)    RDW 19.9 (*)    Platelets 435 (*)    All other components within normal limits  D-DIMER, QUANTITATIVE - Abnormal; Notable for the following components:   D-Dimer, Quant 0.72 (*)    All other components within normal limits  PREGNANCY, URINE  LACTIC ACID, PLASMA  URINALYSIS, W/ REFLEX TO CULTURE (INFECTION SUSPECTED)  TROPONIN I (HIGH SENSITIVITY)  TROPONIN I (HIGH SENSITIVITY)    EKG None  Radiology CT Angio Chest PE W and/or Wo Contrast  Result Date: 02/08/2023 CLINICAL DATA:  Tachycardia and left-sided chest tightness. EXAM: CT ANGIOGRAPHY CHEST WITH CONTRAST TECHNIQUE: Multidetector CT imaging of the chest was performed using the standard protocol during bolus administration of intravenous contrast. Multiplanar CT image reconstructions and MIPs were obtained to evaluate the vascular anatomy. RADIATION DOSE REDUCTION: This exam was performed according to the departmental dose-optimization program which includes automated exposure control, adjustment of the mA and/or kV according to patient size and/or use of iterative reconstruction technique. CONTRAST:  OMNIPAQUE IOHEXOL 350 MG/ML SOLN COMPARISON:  April 08, 2020 FINDINGS: Cardiovascular: The thoracic  aorta is normal in appearance. Satisfactory opacification of the pulmonary arteries to the segmental level. No evidence of pulmonary embolism. Normal heart size. No pericardial effusion. Mediastinum/Nodes: No enlarged mediastinal, hilar, or axillary lymph nodes. Thyroid gland, trachea, and esophagus demonstrate no significant findings. Lungs/Pleura: Very small bilateral pleural effusions are seen. A trace amount of adjacent atelectasis is noted within the posterior aspects of the bilateral lung bases. No pneumothorax is identified. Upper Abdomen: There is diffuse fatty infiltration of the liver parenchyma. Musculoskeletal: No chest wall abnormality. No acute or significant osseous findings. Review of the MIP images confirms the above findings. IMPRESSION: 1. No evidence of pulmonary embolism. 2. Very small bilateral pleural effusions with a trace amount of adjacent bibasilar atelectasis. 3. Hepatic steatosis. Electronically Signed   By: Aram Candela M.D.   On: 02/08/2023 21:51   DG Chest 2 View  Result Date: 02/08/2023 CLINICAL DATA:  Chest pain. EXAM: CHEST - 2 VIEW COMPARISON:  April 08, 2020. FINDINGS: The heart size and mediastinal contours are within normal limits. Both lungs are clear. The visualized skeletal structures are unremarkable. IMPRESSION: No active cardiopulmonary disease. Electronically Signed   By: Lupita Raider M.D.   On: 02/08/2023 18:27    Procedures Procedures    Medications Ordered in ED Medications  diphenhydrAMINE (BENADRYL) capsule 25 mg (25 mg Oral Given 02/08/23 1546)  ondansetron (ZOFRAN-ODT) disintegrating tablet 8 mg (8 mg Oral Given 02/08/23 1547)  sodium chloride 0.9 % bolus 1,000 mL ( Intravenous Stopped 02/08/23 1924)  iohexol (OMNIPAQUE) 350 MG/ML injection 100 mL (100 mLs Intravenous Contrast Given 02/08/23 1932)  sodium chloride 0.9 % bolus 1,000 mL (1,000 mLs Intravenous New Bag/Given 02/08/23 2211)    ED Course/ Medical Decision Making/ A&P                                  Medical Decision Making 42 year old female with past medical history of seizures, asthma, GERD, anemia, and pituitary adenoma presenting to the emergency department today with chest pressure and tachycardia.  I will further evaluate the patient here with basic labs as well as an EKG and troponin to evaluate for ACS, electrolyte abnormalities, or anemia.  Will obtain chest x-ray to evaluate for pulmonary edema, pulmonary infiltrates, or pneumothorax.  Obtain D-dimer here to evaluate for pulmonary embolism.  Will  give patient IV fluids.  Will give her nausea medication.  Will also check a pregnancy test.  I will reevaluate for ultimate disposition.  I have reviewed her chart it does appear that she has had an extensive endocrine workup in the last month or so including thyroid studies which were unremarkable.  The patient's labs were reassuring.  Her D-dimer was positive.  CT angiogram is negative for pulmonary embolism.  The patient's blood pressure did drop to the 80s systolic.  She is given IV fluids and is improved to the 90s.  Looking back through her chart as well as her records at home it appears that her blood pressure is normally in the 90s to very low 100s systolic.  Her lactic acid is within normal limits.  Think she is stable for discharge.  Amount and/or Complexity of Data Reviewed Labs: ordered. Radiology: ordered.  Risk Prescription drug management.           Final Clinical Impression(s) / ED Diagnoses Final diagnoses:  Tachycardia    Rx / DC Orders ED Discharge Orders          Ordered    Ambulatory referral to Cardiology       Comments: If you have not heard from the Cardiology office within the next 72 hours please call [phone].   02/08/23 2259              Durwin Glaze, MD 02/08/23 2309

## 2023-02-08 NOTE — ED Provider Triage Note (Signed)
Emergency Medicine Provider Triage Evaluation Note  Amber [name] , a 42 y.o. female  was evaluated in triage.  Pt complains of fast heart rate as well as left-sided chest tightness.  Patient has been dealing with intermittent bouts of fast heart rate for the last several months.  Has follow-up with primary care regarding this with working diagnosis of POTS.  Developed left-sided chest pain described as tightness yesterday evening when she was letting her dog.  States this has been constant since onset.  States that her heart rate was in the 150s when she was laying on her dog and has remained high since then.  Reports continued left-sided chest tightness.  Reports some associated shortness of breath.  Denies history of DVT/PE, recent surgery/immobilization, known malignancy, known coagulopathy, hormonal therapy..  Review of Systems  Positive: See above Negative:   Physical Exam  BP 110/73 (BP Location: Left Arm)   Pulse (!) 106   Temp 98.8 F (37.1 C)   Resp 18   Ht 5\' 6"  (1.676 m)   Wt 123.8 kg   LMP 01/26/2023   SpO2 98%   BMI 44.06 kg/m  Gen:   Awake, no distress   Resp:  Normal effort  MSK:   Moves extremities without difficulty  Other:  Tachycardia.   Medical Decision Making  Medically screening exam initiated at 3:43 PM.  Appropriate orders placed.  Olga Coaster was informed that the remainder of the evaluation will be completed by another provider, this initial triage assessment does not replace that evaluation, and the importance of remaining in the ED until their evaluation is complete.     Peter Garter, [place] 02/08/23 [phone]

## 2023-02-08 NOTE — Discharge Instructions (Signed)
Your workup here was reassuring overall.  I placed a consult to our cardiology team to hopefully get you in this evening for further evaluation.  Please drink plenty of fluids and follow-up.  Return to the ER for worsening symptoms.

## 2023-02-08 NOTE — ED Triage Notes (Signed)
Pt reports her heart rate has been high since last night. She wears a smart watch and has has been monitoring it. High of 141 today when walking, states her doctor is thinking she may have POTS. Chest pain, tingling in her legs, migraine, vomiting after eating with a scratchy throat.

## 2023-02-14 ENCOUNTER — Ambulatory Visit (INDEPENDENT_AMBULATORY_CARE_PROVIDER_SITE_OTHER): Admitting: Family Medicine

## 2023-02-14 ENCOUNTER — Ambulatory Visit: Attending: Family Medicine

## 2023-02-14 ENCOUNTER — Encounter: Payer: Self-pay | Admitting: Family Medicine

## 2023-02-14 VITALS — BP 90/62 | HR 110 | Temp 98.0°F | Resp 16 | Ht 66.0 in | Wt 271.8 lb

## 2023-02-14 DIAGNOSIS — R7989 Other specified abnormal findings of blood chemistry: Secondary | ICD-10-CM

## 2023-02-14 DIAGNOSIS — M79662 Pain in left lower leg: Secondary | ICD-10-CM | POA: Diagnosis not present

## 2023-02-14 DIAGNOSIS — E782 Mixed hyperlipidemia: Secondary | ICD-10-CM

## 2023-02-14 DIAGNOSIS — R000 Tachycardia, unspecified: Secondary | ICD-10-CM

## 2023-02-14 DIAGNOSIS — E1165 Type 2 diabetes mellitus with hyperglycemia: Secondary | ICD-10-CM

## 2023-02-14 DIAGNOSIS — Z7985 Long-term (current) use of injectable non-insulin antidiabetic drugs: Secondary | ICD-10-CM

## 2023-02-14 NOTE — Progress Notes (Signed)
Chief Complaint  Patient presents with   Follow-up    Follow up    Subjective: Patient is a 42 y.o. female here for f/u.  Patient is here for emergency department follow-up.  She went to the ED for tachycardia.  She had an elevated D-dimer.  CTA chest was unremarkable for PE.  She did start having calf pain several days later.  No obvious injury or change in activity.  There are some associated lower extremity swelling.  She is waiting on an appointment with the cardiology team for her tachycardia.  She is never had a long-term monitor.  No syncope.  Patient has a history of diabetes and high cholesterol.  She is working up on the dosage of Greggory Keen currently taking 5 mg weekly.  Sugars are in the low 100s, significantly improved.  She is also taking metformin 1000 mg twice daily.  She has lost around 20 pounds.  She is also compliant with rosuvastatin 20 mg daily.  Diet is much better.  She is walking for exercise.  Past Medical History:  Diagnosis Date   Anxiety    Asthma    Depression    GERD (gastroesophageal reflux disease)    History of chicken pox    History of migraine headaches    Migraines    Pituitary adenoma (HCC)    Seizures (HCC)     Objective: BP 90/62 (BP Location: Left Arm, Patient Position: Sitting, Cuff Size: Normal)   Pulse (!) 110   Temp 98 F (36.7 C) (Oral)   Resp 16   Ht 5\' 6"  (1.676 m)   Wt 271 lb 12.8 oz (123.3 kg)   LMP 01/26/2023   SpO2 96%   BMI 43.87 kg/m  General: Awake, appears stated age Heart: Tachycardic, regular rhythm, +1 pitting edema on the right lower extremity, no significant edema over the left MSK: TTP over the right gastroc region, no TTP over the left, negative Homans Lungs: CTAB, no rales, wheezes or rhonchi. No accessory muscle use Psych: Age appropriate judgment and insight, normal affect and mood  Assessment and Plan: Positive D dimer - Plan: US Venous Img Lower Unilateral Right  Pain of left calf - Plan: US Venous Img  Lower Unilateral Right  Tachycardia - Plan: LONG TERM MONITOR (3-14 DAYS)  Mixed hyperlipidemia - Plan: Lipid panel  Type 2 diabetes mellitus with hyperglycemia, without long-term current use of insulin (HCC)  1/2.  Rule out DVT as above.  Send her downstairs after blood work today. 3.  Will order a Zio patch for 14 days.  She has an appointment next month with the cardiology team.  This will be an important information to have. 4.  Chronic, hopefully stable.  Continue Crestor 20 mg daily.  Counseled on exercise.  Check lipid panel today. 5.  Chronic, probably improving.  Continue Mounjaro dosage escalation every 4 weeks.  She is currently on 5 mg weekly and doing very well.  She is monitoring her sugars with CGM.  Will plan to follow-up on her A1c in a couple months. The patient voiced understanding and agreement to the plan.  Jilda Roche Questa, DO 02/14/23  4:53 PM

## 2023-02-14 NOTE — Progress Notes (Unsigned)
EP to read

## 2023-02-14 NOTE — Patient Instructions (Addendum)
Someone will reach out regarding your heart monitor set up. If you don't hear anything, please reach out to [place].   Keep the diet clean and stay active.  Please consider adding some weight resistance exercise to your routine. Consider yoga as well.   Stay hydrated.   Let us know if you need anything.

## 2023-02-15 ENCOUNTER — Ambulatory Visit (HOSPITAL_BASED_OUTPATIENT_CLINIC_OR_DEPARTMENT_OTHER)
Admission: RE | Admit: 2023-02-15 | Discharge: 2023-02-15 | Disposition: A | Discharge status: Home / Self Care | Source: Ambulatory Visit | Attending: Family Medicine | Admitting: Family Medicine

## 2023-02-15 DIAGNOSIS — M79662 Pain in left lower leg: Secondary | ICD-10-CM | POA: Diagnosis present

## 2023-02-15 DIAGNOSIS — R7989 Other specified abnormal findings of blood chemistry: Secondary | ICD-10-CM | POA: Insufficient documentation

## 2023-02-15 LAB — LIPID PANEL
Cholesterol: 94 mg/dL (ref 0–200)
HDL: 28.3 mg/dL — ABNORMAL LOW (ref >39.00)
LDL Cholesterol: 31 mg/dL (ref 0–99)
NonHDL: 66.08
Total CHOL/HDL Ratio: 3
Triglycerides: 173 mg/dL — ABNORMAL HIGH (ref 0.0–149.0)
VLDL: 34.6 mg/dL (ref 0.0–40.0)

## 2023-02-17 DIAGNOSIS — R000 Tachycardia, unspecified: Secondary | ICD-10-CM | POA: Diagnosis not present

## 2023-03-06 ENCOUNTER — Other Ambulatory Visit: Payer: Self-pay | Admitting: Neurology

## 2023-03-06 ENCOUNTER — Other Ambulatory Visit: Payer: Self-pay | Admitting: Family Medicine

## 2023-03-06 ENCOUNTER — Encounter: Payer: Self-pay | Admitting: Family Medicine

## 2023-03-06 MED ORDER — TIRZEPATIDE 10 MG/0.5ML ~~LOC~~ SOAJ: 10.0000 mg | SUBCUTANEOUS | 0 refills | Status: DC

## 2023-03-06 MED ORDER — TIRZEPATIDE 15 MG/0.5ML ~~LOC~~ SOAJ: 15.0000 mg | SUBCUTANEOUS | 2 refills | Status: DC

## 2023-03-06 MED ORDER — TIRZEPATIDE 12.5 MG/0.5ML ~~LOC~~ SOAJ: 12.5000 mg | SUBCUTANEOUS | 0 refills | Status: DC

## 2023-03-13 ENCOUNTER — Inpatient Hospital Stay: Attending: Medical Oncology

## 2023-03-13 ENCOUNTER — Encounter: Payer: Self-pay | Admitting: Medical Oncology

## 2023-03-13 ENCOUNTER — Ambulatory Visit

## 2023-03-13 ENCOUNTER — Inpatient Hospital Stay (HOSPITAL_BASED_OUTPATIENT_CLINIC_OR_DEPARTMENT_OTHER): Admitting: Medical Oncology

## 2023-03-13 VITALS — BP 124/78 | HR 98 | Ht 66.0 in | Wt 264.8 lb

## 2023-03-13 VITALS — BP 102/70 | HR 90 | Temp 98.8°F | Resp 18 | Ht 66.0 in | Wt 263.0 lb

## 2023-03-13 DIAGNOSIS — D509 Iron deficiency anemia, unspecified: Secondary | ICD-10-CM | POA: Diagnosis present

## 2023-03-13 DIAGNOSIS — Z800 Family history of malignant neoplasm of digestive organs: Secondary | ICD-10-CM

## 2023-03-13 DIAGNOSIS — R002 Palpitations: Secondary | ICD-10-CM | POA: Insufficient documentation

## 2023-03-13 DIAGNOSIS — I479 Paroxysmal tachycardia, unspecified: Secondary | ICD-10-CM | POA: Insufficient documentation

## 2023-03-13 DIAGNOSIS — R7989 Other specified abnormal findings of blood chemistry: Secondary | ICD-10-CM

## 2023-03-13 DIAGNOSIS — K219 Gastro-esophageal reflux disease without esophagitis: Secondary | ICD-10-CM

## 2023-03-13 DIAGNOSIS — E639 Nutritional deficiency, unspecified: Secondary | ICD-10-CM

## 2023-03-13 LAB — CBC
HCT: 38.4 % (ref 36.0–46.0)
Hemoglobin: 12.9 g/dL (ref 12.0–15.0)
MCH: 26.7 pg (ref 26.0–34.0)
MCHC: 33.6 g/dL (ref 30.0–36.0)
MCV: 79.3 fL — ABNORMAL LOW (ref 80.0–100.0)
Platelets: 415 10*3/uL — ABNORMAL HIGH (ref 150–400)
RBC: 4.84 MIL/uL (ref 3.87–5.11)
RDW: 20.8 % — ABNORMAL HIGH (ref 11.5–15.5)
WBC: 10.1 10*3/uL (ref 4.0–10.5)
nRBC: 0 % (ref 0.0–0.2)

## 2023-03-13 LAB — RETICULOCYTES
Immature Retic Fract: 14.9 % (ref 2.3–15.9)
RBC.: 4.91 MIL/uL (ref 3.87–5.11)
Retic Count, Absolute: 80.5 10*3/uL (ref 19.0–186.0)
Retic Ct Pct: 1.6 % (ref 0.4–3.1)

## 2023-03-13 LAB — FERRITIN: Ferritin: 54 ng/mL (ref 11–307)

## 2023-03-13 MED ORDER — METOPROLOL TARTRATE 25 MG PO TABS: 25.0000 mg | ORAL_TABLET | Freq: Two times a day (BID) | ORAL | 3 refills | Status: DC

## 2023-03-13 NOTE — Assessment & Plan Note (Signed)
Appears related to elevated heart rate based on event monitor correlating with sinus tachycardia versus atrial tachycardia.  Management as above.

## 2023-03-13 NOTE — Patient Instructions (Addendum)
Medication Instructions:   Your physician has recommended you make the following change in your medication:   Start taking metoprolol tartrate 25 mg twice a day.  *If you need a refill on your cardiac medications before your next appointment, please call your pharmacy*   Lab Work: None Ordered If you have labs (blood work) drawn today and your tests are completely normal, you will receive your results only by: MyChart Message (if you have MyChart) OR A paper copy in the mail If you have any lab test that is abnormal or we need to change your treatment, we will call you to review the results.   Testing/Procedures: Echocardiogram An echocardiogram is a test that uses sound waves (ultrasound) to produce images of the heart. Images from an echocardiogram can provide important information about: Heart size and shape. The size and thickness and movement of your heart's walls. Heart muscle function and strength. Heart valve function or if you have stenosis. Stenosis is when the heart valves are too narrow. If blood is flowing backward through the heart valves (regurgitation). A tumor or infectious growth around the heart valves. Areas of heart muscle that are not working well because of poor blood flow or injury from a heart attack. Aneurysm detection. An aneurysm is a weak or damaged part of an artery wall. The wall bulges out from the normal force of blood pumping through the body. Tell a health care provider about: Any allergies you have. All medicines you are taking, including vitamins, herbs, eye drops, creams, and over-the-counter medicines. Any blood disorders you have. Any surgeries you have had. Any medical conditions you have. Whether you are pregnant or may be pregnant. What are the risks? Generally, this is a safe test. However, problems may occur, including an allergic reaction to dye (contrast) that may be used during the test. What happens before the test? No specific  preparation is needed. You may eat and drink normally. What happens during the test?  You will take off your clothes from the waist up and put on a hospital gown. Electrodes or electrocardiogram (ECG)patches may be placed on your chest. The electrodes or patches are then connected to a device that monitors your heart rate and rhythm. You will lie down on a table for an ultrasound exam. A gel will be applied to your chest to help sound waves pass through your skin. A handheld device, called a transducer, will be pressed against your chest and moved over your heart. The transducer produces sound waves that travel to your heart and bounce back (or "echo" back) to the transducer. These sound waves will be captured in real-time and changed into images of your heart that can be viewed on a video monitor. The images will be recorded on a computer and reviewed by your health care provider. You may be asked to change positions or hold your breath for a short time. This makes it easier to get different views or better views of your heart. In some cases, you may receive contrast through an IV in one of your veins. This can improve the quality of the pictures from your heart. The procedure may vary among health care providers and hospitals. What can I expect after the test? You may return to your normal, everyday life, including diet, activities, and medicines, unless your health care provider tells you not to do that. Follow these instructions at home: It is up to you to get the results of your test. Ask your health care  provider, or the department that is doing the test, when your results will be ready. Keep all follow-up visits. This is important. Summary An echocardiogram is a test that uses sound waves (ultrasound) to produce images of the heart. Images from an echocardiogram can provide important information about the size and shape of your heart, heart muscle function, heart valve function, and other  possible heart problems. You do not need to do anything to prepare before this test. You may eat and drink normally. After the echocardiogram is completed, you may return to your normal, everyday life, unless your health care provider tells you not to do that. This information is not intended to replace advice given to you by your health care provider. Make sure you discuss any questions you have with your health care provider. Document Revised: 11/18/2020 Document Reviewed: 10/29/2019 Elsevier Patient Education  2023 Elsevier Inc.       Follow-Up: At [facility], you and your health needs are our priority.  As part of our continuing mission to provide you with exceptional heart care, we have created designated Provider Care Teams.  These Care Teams include your primary Cardiologist (physician) and Advanced Practice Providers (APPs -  Physician Assistants and Nurse Practitioners) who all work together to provide you with the care you need, when you need it.  We recommend signing up for the patient portal called "MyChart".  Sign up information is provided on this After Visit Summary.  MyChart is used to connect with patients for Virtual Visits (Telemedicine).  Patients are able to view lab/test results, encounter notes, upcoming appointments, etc.  Non-urgent messages can be sent to your provider as well.   To learn more about what you can do with MyChart, go to [email or web address].    Your next appointment:   2 month follow up with Dr. Vincent Gros

## 2023-03-13 NOTE — Progress Notes (Signed)
Hematology and Oncology Follow Up Visit  Amber [name] [id number] [birth year] 42 y.o. 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 GI evaluation was recommended given her family history  (colon cancer of father). She was also start on IV venofer and is S/P one cycle of treatment.   She is here today to have levels rechecked.   She states that her symptoms of fatigue have remained about the same. 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   She reports tolerating the IV iron well without side effects.   There has been no bleeding to her knowledge aside from her menstrual cycles: denies epistaxis, gingivitis, hemoptysis, hematemesis, hematuria, melena, excessive bruising, blood donation.   Menstrual cycles are regular   Wt Readings from Last 3 Encounters:  03/13/23 263 lb (119.3 kg)  03/13/23 264 lb 12.8 oz (120.1 kg)  02/14/23 271 lb 12.8 oz (123.3 kg)     Medications:   Current Outpatient Medications:    acetaminophen (TYLENOL) 500 MG tablet, Take 500 mg by mouth as needed., Disp: , Rfl:    cabergoline (DOSTINEX) 0.5 MG tablet, 2.5 mg weekly (Two tabs on Mondays, 1 tab on wednesdays and 2 tabs on Fridays), Disp: 65 tablet, Rfl: 3   Continuous Glucose Sensor (DEXCOM G7 SENSOR) MISC, Use daily to check blood sugar,  DXE11.65, Disp:  1 each, Rfl: 11   escitalopram (LEXAPRO) 10 MG tablet, TAKE ONE TABLET BY MOUTH EVERY DAY, Disp: 90 tablet, Rfl: 3   famotidine (PEPCID) 20 MG tablet, Take 20 mg by mouth 2 (two) times daily., Disp: , Rfl:    fenofibrate (TRICOR) 145 MG tablet, Take 1 tablet (145 mg total) by mouth daily., Disp: 90 tablet, Rfl: 3   gabapentin (NEURONTIN) 300 MG capsule, Take 1 capsule by mouth at bedtime., Disp: , Rfl:    Galcanezumab-gnlm (EMGALITY) 120 MG/ML SOAJ, Inject 120 mg into the skin every 21 ( twenty-one) days., Disp: 1.12 mL, Rfl: 11   glucose blood (ACCU-CHEK GUIDE) test strip, 1 each by Other route daily in the afternoon. Use as instructed, Disp: 100 each, Rfl: 3   hydrOXYzine (ATARAX/VISTARIL) 25 MG tablet, TAKE 1-3 TABLETS (25-75 MG TOTAL) BY MOUTH 3 (THREE) TIMES DAILY AS NEEDED FOR ANXIETY., Disp: 270 tablet, Rfl: 1   metformin (FORTAMET) 1000 MG (OSM) 24 hr tablet, Take 1 tablet (1,000 mg total) by mouth 2 (two) times daily with a meal., Disp: 180 tablet, Rfl: 3   metoprolol tartrate (LOPRESSOR) 25 MG tablet, Take 1 tablet (25 mg total) by mouth 2 (two) times daily., Disp: 180 tablet, Rfl: 3   ondansetron (ZOFRAN) 8 MG tablet, Take 1 tablet (8 mg total) by mouth as needed for nausea or vomiting., Disp: 60 tablet, Rfl: 1   pantoprazole (PROTONIX)  40 MG tablet, Take 1 tablet (40 mg total) by mouth 2 (two) times daily., Disp: 180 tablet, Rfl: 3   promethazine (PHENERGAN) 25 MG tablet, Take 1 tablet (25 mg total) by mouth every 8 (eight) hours as needed for nausea or vomiting., Disp: 12 tablet, Rfl: 0   QUEtiapine (SEROQUEL) 300 MG tablet, Take 1 tablet (300 mg total) by mouth at bedtime., Disp: 90 tablet, Rfl: 3   Riboflavin 400 MG CAPS, Take 400 mg by mouth daily., Disp: , Rfl:    Rimegepant Sulfate (NURTEC) 75 MG TBDP, Take 1 tablet (75 mg total) by mouth as needed (Daily as needed for a Migraine). Maximum 1 tablet in 24 hours.  Quantity 8., Disp: 8 tablet, Rfl: 11   rosuvastatin (CRESTOR) 20 MG  tablet, Take 1 tablet (20 mg total) by mouth daily., Disp: 90 tablet, Rfl: 3   tirzepatide (MOUNJARO) 7.5 MG/0.5ML Pen, Inject 7.5 mg into the skin once a week for 28 days., Disp: 2 mL, Rfl: 0   topiramate (TOPAMAX) 100 MG tablet, Take 1 tablet (100 mg total) by mouth at bedtime., Disp: 30 tablet, Rfl: 5   traMADol (ULTRAM) 50 MG tablet, TAKE ONE TABLET BY MOUTH EVERY 6 HOURS AS NEEDED, Disp: 30 tablet, Rfl: 1   Vitamin D, Ergocalciferol, (DRISDOL) 1.25 MG (50000 UNIT) CAPS capsule, Take 1 capsule (50,000 Units total) by mouth every 7 (seven) days., Disp: 12 capsule, Rfl: 0   [START ON 04/03/2023] tirzepatide (MOUNJARO) 10 MG/0.5ML Pen, Inject 10 mg into the skin once a week for 28 days. (Patient not taking: Reported on 03/13/2023), Disp: 2 mL, Rfl: 0   [START ON 04/20/2023] tirzepatide (MOUNJARO) 12.5 MG/0.5ML Pen, Inject 12.5 mg into the skin once a week for 28 days. (Patient not taking: Reported on 03/13/2023), Disp: 2 mL, Rfl: 0   [START ON 05/18/2023] tirzepatide (MOUNJARO) 15 MG/0.5ML Pen, Inject 15 mg into the skin once a week. (Patient not taking: Reported on 03/13/2023), Disp: 2 mL, Rfl: 2  Allergies:  Allergies  Allergen Reactions   Aspirin    Beeswax    Chlorhexidine    Ibuprofen    Iodine     Patient stated she is allergic to topical iodine.    Lunesta [Eszopiclone] Other (See Comments)    GI issues   Shellfish Allergy     Tingle in mouth/throat   Sulfa Antibiotics    Triptans Other (See Comments)    Makes pain worse   Nsaids Nausea And Vomiting   Tape Rash    Blisters     Past Medical History, Surgical history, Social history, and Family History were reviewed and updated.  Review of Systems: As stated above in HPI   Physical Exam:  height is 5\' 6"  (1.676 m) and weight is 263 lb (119.3 kg). Her oral temperature is 98.8 F (37.1 C). Her blood pressure is 102/70 and her pulse is 90. Her respiration is 18 and oxygen saturation is 100%.   Physical Exam General:  NAD Cardiovascular: regular rate and rhythm Pulmonary: clear ant fields Abdomen: soft, nontender, + bowel sounds GU: no suprapubic tenderness Extremities: no edema, no joint deformities Skin: no rashes Neurological: Weakness but otherwise nonfocal   Lab Results  Component Value Date   WBC 10.1 03/13/2023   HGB 12.9 03/13/2023   HCT 38.4 03/13/2023   MCV 79.3 (L) 03/13/2023   PLT 415 (H) 03/13/2023     Chemistry      Component Value Date/Time   NA 138 02/08/2023 1508  K 3.6 02/08/2023 1508   CL 111 02/08/2023 1508   CO2 20 (L) 02/08/2023 1508   BUN 14 02/08/2023 1508   CREATININE 1.15 (H) 02/08/2023 1508   CREATININE 0.99 12/24/2019 1316      Component Value Date/Time   CALCIUM 9.1 02/08/2023 1508   ALKPHOS 43 01/26/2023 2236   AST 29 01/26/2023 2236   ALT 27 01/26/2023 2236   BILITOT 0.5 01/26/2023 2236     Encounter Diagnoses  Name Primary?   Iron deficiency anemia, unspecified iron deficiency anemia type Yes   Family history of colon cancer in father    Gastroesophageal reflux disease, unspecified whether esophagitis present    Assessment and Plan- Patient is a 42 y.o. female with multifactorial anemia. 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 She also has a history of nutritional deficiencies.   Our plan is still to have her evaluated by GI, to get her iron levels into target range (ferritin of 50-100; iron saturation normal). We will also recheck B12 and folate levels to see if there are additional factors in her anemia/symptoms.   We will follow her closely at this time. We will plan to see her in 1 month or sooner as needed.   Disposition: RTC 1 month APP, labs (CBC, iron, ferritin, B12, folate)   Clent Jacks PA-C 12/23/20243:55 PM

## 2023-03-13 NOTE — Progress Notes (Signed)
Cardiology Consultation:    Date:  03/13/2023   ID:  Amber [name], DOB November 08, 1980, MRN [id number]  PCP:  Amber Dory, DO  Cardiologist:  Amber Corporal Elaynah Virginia, MD   Referring MD: Amber [name]*   No chief complaint on file.    ASSESSMENT AND PLAN:   [name] is a 42 year old woman with no significant coronary atherosclerosis on prior CT imaging from August 2021, normal biventricular function on prior echocardiogram from July 2021, hepatic steatosis, migraine, pituitary adenoma, asthma, obesity, diabetes, former smoker [quit in 2017], with history of palpitations leading to recent ER visit in November subsequently with event monitor for 14 days study noting symptom triggered events correlating with sinus tachycardia versus atrial tachycardia.   Problem List Items Addressed This Visit     Palpitations   Appears related to elevated heart rate based on event monitor correlating with sinus tachycardia versus atrial tachycardia.  Management as above.      Relevant Orders   EKG 12-Lead (Completed)   Paroxysmal tachycardia (HCC) - Primary   Sinus versus atrial tachycardia on event monitor 14-day study from 02/17/2023. Triggered with positional changes. Near syncopal and syncopal events reported recently. Symptoms recently worse over the past 8 months. Average heart rate sinus rhythm 86/min [ranging from 37 bpm to 147 bpm].   Advised to avoid any stimulants. Advised to drink plenty of fluids through the day, encouraged to drink water.  Will obtain transthoracic echocardiogram to rule out any cardiac structural and functional issues.  Will start low-dose metoprolol tartrate 25 mg twice daily. Reviewed potential side effects. Advised to cut down the dose to half if she has any symptoms of lightheadedness or dizziness once she starts taking the medication.  She has a smart watch, [employer] brand, able to take EKGs and also able to monitor her resting and  walking heart rates.  Will review her interval symptoms and echocardiogram results at subsequent follow-up visit in 2 months.         Relevant Medications   metoprolol tartrate (LOPRESSOR) 25 MG tablet   Other Relevant Orders   EKG 12-Lead (Completed)   ECHOCARDIOGRAM COMPLETE   Return to clinic for follow-up 2 months.   History of Present Illness:    Amber [name] is a 42 y.o. female who is being seen today for the evaluation of tachycardia at the request of Amber [name]*.   Pleasant woman here for the visit by herself.  Lives with her husband at home.  Currently not employed and has been on disability due to her longstanding migraine symptoms.  History of normal CT coronary angiogram study from August 2021 [calcium score 0, normal origin and course of the arteries CAD RADS 0 study], GERD, asthma, pituitary adenoma, migraine, hepatic steatosis noted on CT imaging of the chest November 2024, obesity, diabetes recently diagnosed [hemoglobin A1c 11 in September 2024], former smoker [quit in 2017].  Recently on 02/08/2023 had a visit to the ER for symptoms of palpitations associated with tachycardia and lightheadedness.  D-dimer was mildly elevated.  Troponin high-sensitivity was unremarkable.  CTA chest was done that ruled out pulmonary embolism.  It noted hepatic steatosis, very small bilateral pleural effusions with basilar atelectasis.  PCP subsequently obtained lower extremity DVT that showed no evidence of DVT in the right lower extremity.  She reports symptoms of palpitations associated with a sensation of fast heartbeat, triggered with positional changes from supine to sitting or sitting to standing position.  Can last for minutes.  At times associated with near syncopal episodes.  She more recently on Saturday this weekend sustained a fall from brief loss of consciousness for few seconds.  Event monitor 14-day study from 02/17/2023 reported predominantly sinus  rhythm average heart rate 86/min [ranging from 37 bpm to 147 bpm].  No evidence of atrial fibrillation.  Rare supraventricular and ventricular ectopy burden less than 1%.  There were 43 patient triggered events most of which correlated with artifact sinus tachycardia at times suspicious for atrial tachycardia.  Tracings reviewed.  Couple tracings suspicious for atrial tachycardia screenshots documented below.  EKG in the clinic today shows sinus rhythm heart rate 96/min, normal PR interval 184 ms, QRS duration 80 ms.  QTc normal 454 ms.  Last echocardiogram to review was from 10/02/2019 normal biventricular function LVEF 60 to 65%.  No significant valve abnormalities.  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  Last lipid panel to review is from 02/14/2023 total cholesterol 94, triglycerides 173, HDL 28, LDL 31. CBC hemoglobin 12.1, hematocrit 37.7, platelets 435, WBC 10.2.  Last TSH check was 01/06/2023 0.87, normal, free T4 level was normal 0.83  Past Medical History:  Diagnosis Date   Anxiety    Asthma    Depression    GERD (gastroesophageal reflux disease)    History of chicken pox    History of migraine headaches    Migraines    Pituitary adenoma (HCC)    Seizures (HCC)     Past Surgical History:  Procedure Laterality Date   APPENDECTOMY     CHOLECYSTECTOMY     Pituiary Tumor     Tumor Removal Pituitary    Current Medications: Current Meds  Medication Sig   metoprolol tartrate (LOPRESSOR) 25 MG tablet Take 1 tablet (25 mg total) by mouth 2 (two) times daily.     Allergies:   Aspirin, Beeswax, Chlorhexidine, Ibuprofen, Iodine, Lunesta [eszopiclone], Nsaids, Shellfish allergy, Sulfa antibiotics, Triptans, and Tape   Social History   Socioeconomic History   Marital status: Married     Spouse name: Amber [name]   Number of children: 0   Years of education: Not on file   Highest education level: 12th grade  Occupational History   Occupation: unemployed  Tobacco Use   Smoking status: Former    Current packs/day: 0.00    Average packs/day: 1 pack/day for 15.0 years (15.0 ttl pk-yrs)    Types: Cigarettes    Start date: 01/23/2001    Quit date: 01/24/2016    Years since quitting: 7.1   Smokeless tobacco: Never  Vaping Use   Vaping status: Every Day  Substance and Sexual Activity   Alcohol use: No   Drug use: No   Sexual activity: Yes    Birth control/protection: None  Other Topics Concern   Not on file  Social History Narrative   Patient is right-handed. She lives with her husband in a 2 level home. She drinks I cup of coffee a day. She walks daily, and rides her recumbent bike QOD.   Social Drivers of Corporate investment banker Strain: Not on file  Food Insecurity: Not on file  Transportation Needs: Not on file  Physical Activity: Not on file  Stress: Not on file  Social Connections: Not on file     Family History: The patient's family history includes Bipolar disorder in her brother  and mother; Cancer in her father; Colon cancer in her father; Diabetes in her father and mother; Hyperlipidemia in her father and mother; Hypertension in her father; Irritable bowel syndrome in her mother; Migraines in her mother and sister. There is no history of Other or Esophageal cancer. ROS:   Please see the history of present illness.    All 14 point review of systems negative except as described per history of present illness.  EKGs/Labs/Other Studies Reviewed:    The following studies were reviewed today:       Cardiac Studies & Procedures      ECHOCARDIOGRAM  ECHOCARDIOGRAM COMPLETE 10/02/2019  Narrative ECHOCARDIOGRAM REPORT    Patient Name:   Amber [name] Date of Exam: 10/02/2019 Medical Rec #:  [id number]          Height:       66.0 in Accession #:     [id number]         Weight:       261.0 lb Date of Birth:  1981-01-27         BSA:          2.239 m Patient Age:    38 years           BP:           113/79 mmHg Patient Gender: F                  HR:           64 bpm. 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 Risk Factors:Former Smoker. Asthma, Cabergoline (more than 2mg /week).  Sonographer:    Farrel Conners RDCS Referring Phys: [phone] IBTEHAL JARALLA SHAMLEFFER  IMPRESSIONS   1. Left ventricular ejection fraction, by estimation, is 60 to 65%. The left ventricle has normal function. The left ventricle has no regional wall motion abnormalities. Left ventricular diastolic parameters were normal. 2. Right ventricular systolic function is normal. The right ventricular size is normal. 3. The mitral valve is normal in structure. No evidence of mitral valve regurgitation. No evidence of mitral stenosis. 4. The aortic valve is tricuspid. Aortic valve regurgitation is not visualized. No aortic stenosis is present. 5. The inferior vena cava is normal in size with greater than 50% respiratory variability, suggesting right atrial pressure of 3 mmHg.  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  FINDINGS Left Ventricle: Left ventricular ejection fraction, by estimation, is 60 to 65%. The left ventricle has normal function. The left ventricle has no regional wall motion abnormalities. The left ventricular internal cavity size was normal in size. There is no left ventricular hypertrophy. Left ventricular diastolic parameters were normal. Normal left ventricular filling pressure.  Right Ventricle: The right ventricular size is  normal. No increase in right ventricular wall thickness. Right ventricular systolic function is normal.  Left Atrium: Left atrial size was normal in size.  Right Atrium: Right atrial size was normal in size.  Pericardium: There is no evidence of pericardial effusion.  Mitral Valve: The mitral valve is normal in structure. Normal mobility of the mitral valve leaflets. No evidence of mitral valve regurgitation. No evidence of mitral valve stenosis.  Tricuspid Valve:  The tricuspid valve is normal in structure. Tricuspid valve regurgitation is not demonstrated. No evidence of tricuspid stenosis.  Aortic Valve: The aortic valve is tricuspid. Aortic valve regurgitation is not visualized. No aortic stenosis is present.  Pulmonic Valve: The pulmonic valve was normal in structure. Pulmonic valve regurgitation is not visualized. No evidence of pulmonic stenosis.  Aorta: The aortic root, ascending aorta and aortic arch are all structurally normal, with no evidence of dilitation or obstruction.  Venous: The inferior vena cava is normal in size with greater than 50% respiratory variability, suggesting right atrial pressure of 3 mmHg.  IAS/Shunts: No atrial level shunt detected by color flow Doppler.   LEFT VENTRICLE PLAX 2D LVIDd:         5.27 cm  Diastology LVIDs:         3.36 cm  LV e' lateral:   18.20 cm/s LV PW:         1.23 cm  LV E/e' lateral: 6.2 LV IVS:        0.84 cm  LV e' medial:    10.60 cm/s LVOT diam:     2.40 cm  LV E/e' medial:  10.6 LV SV:         136 LV SV Index:   61 LVOT Area:     4.52 cm   RIGHT VENTRICLE RV S prime:     19.40 cm/s TAPSE (M-mode): 2.3 cm  LEFT ATRIUM             Index       RIGHT ATRIUM           Index LA diam:        4.00 cm 1.79 cm/m  RA Area:     22.90 cm LA Vol (A2C):   62.8 ml 28.04 ml/m RA Volume:   70.50 ml  31.48 ml/m LA Vol (A4C):   63.2 ml 28.22 ml/m LA Biplane Vol: 65.7 ml 29.34 ml/m AORTIC VALVE LVOT Vmax:   142.00 cm/s LVOT  Vmean:  92.300 cm/s LVOT VTI:    0.301 m  AORTA Ao Root diam: 3.20 cm Ao Asc diam:  3.25 cm  MITRAL VALVE MV Area (PHT)  cm          SHUNTS MV Decel Time: 222 msec     Systemic VTI:  0.30 m MV E velocity: 112.00 cm/s  Systemic Diam: 2.40 cm MV A velocity: 86.10 cm/s MV E/A ratio:  1.30  Norman Herrlich MD Electronically signed by Norman Herrlich MD Signature Date/Time: 10/02/2019/5:35:00 PM    Final   MONITORS  LONG TERM MONITOR (3-14 DAYS) 03/10/2023  Narrative Patch Wear Time:  13 days and 4 hours  HR 37-147, average 86 bpm. No atrial fibrillation detected. Rare supraventricular ectopy. Rare ventricular ectopy. No sustained arrhythmias. Symptom trigger episodes correspond to sinus rhythm and sinus tachycardia  vs atrial tachycardia   Cardiology referral placed based on protocol.   Will [facility] Cardiac Electrophysiology  CT SCANS  CT CORONARY MORPH W/CTA COR W/SCORE 10/31/2019  Addendum 10/31/2019  9:54 AM ADDENDUM REPORT: 10/31/2019 09:52  HISTORY: Concern for retroaortic coronary artery on Echo, please evaluate per cardiology recommendations  EXAM: Cardiac/Coronary  CT  TECHNIQUE: The patient was scanned on a [employer].  PROTOCOL: A 120 kV prospective scan was triggered in the descending thoracic aorta at 111 HU's. Axial non-contrast 3 mm slices were carried out through the heart. The data set was analyzed on a dedicated work station and scored using  the Agatson method. Gantry rotation speed was 250 msecs and collimation was .6 mm. Beta blockade and 0.8 mg of sl NTG was given. The 3D data set was reconstructed in 5% intervals of the 67-82 % of the R-R cycle. Diastolic phases were analyzed on a dedicated work station using MPR, MIP and VRT modes. The patient received 80mL OMNIPAQUE IOHEXOL 350 MG/ML SOLN of contrast.  FINDINGS: Coronary calcium score is 0.  Coronary arteries: Normal coronary origins and course.  Right dominance.  Right Coronary Artery: No detectable plaque or stenosis.  Left Main Coronary Artery: No detectable plaque or stenosis.  Left Anterior Descending Coronary Artery: No detectable plaque or stenosis.  Left Circumflex Artery: No detectable plaque or stenosis.  Aorta: Normal size, 31 mm at the mid ascending aorta (level of the PA bifurcation) measured double oblique. No calcifications. No dissection.  Aortic Valve: No calcifications.  Other findings:  Normal pulmonary vein drainage into the left atrium.  Normal left atrial appendage without a thrombus.  Normal size of the pulmonary artery.  Possible patent foramen ovale, no definite interatrial shunt seen.  IMPRESSION: 1.  Normal origin and course of the coronary arteries.  2.  No evidence of CAD, CADRADS = 0.  Coronary calcium score is 0.   Electronically Signed By: Weston Brass On: 10/31/2019 09:52  Narrative EXAM: OVER-READ INTERPRETATION  CT CHEST  The following report is an over-read performed by radiologist Dr. Jeronimo Greaves of [facility] on 10/31/2019. This over-read does not include interpretation of cardiac or coronary anatomy or pathology. The coronary CTA interpretation by the cardiologist is attached.  COMPARISON:  Chest radiograph 10/21/2019  FINDINGS: Vascular: Normal aortic caliber. No central pulmonary embolism, on this non-dedicated study.  Mediastinum/Nodes: No imaged thoracic adenopathy.  Lungs/Pleura: No pleural fluid.  Clear imaged lungs.  Upper Abdomen: Normal imaged portions of the liver, spleen.  Musculoskeletal: No acute osseous abnormality.  IMPRESSION: No acute findings in the imaged extracardiac chest.  Electronically Signed: By: Jeronimo Greaves M.D. On: 10/31/2019 08:57          EKG:       Recent Labs: 11/30/2022: Magnesium 1.7 01/06/2023: TSH 0.87 01/26/2023: ALT 27 02/08/2023: BUN 14; Creatinine, Ser 1.15; Hemoglobin 12.1; Platelets 435;  Potassium 3.6; Sodium 138  Recent Lipid Panel    Component Value Date/Time   CHOL 94 02/14/2023 1626   TRIG 173.0 (H) 02/14/2023 1626   HDL 28.30 (L) 02/14/2023 1626   CHOLHDL 3 02/14/2023 1626   VLDL 34.6 02/14/2023 1626   LDLCALC 31 02/14/2023 1626   LDLCALC 50 12/11/2020 1350   LDLDIRECT 77.0 10/07/2020 1557    Physical Exam:    VS:  BP 124/78   Pulse 98   Ht 5\' 6"  (1.676 m)   Wt 264 lb 12.8 oz (120.1 kg)   SpO2 97%   BMI 42.74 kg/m     Wt Readings from Last 3 Encounters:  03/13/23 264 lb 12.8 oz (120.1 kg)  02/14/23 271 lb 12.8 oz (123.3 kg)  02/08/23 273 lb (123.8 kg)     GENERAL:  Well nourished, well developed in no acute distress NECK: No JVD; No carotid bruits CARDIAC: RRR, S1 and S2 present, no murmurs, no rubs, no gallops CHEST:  Clear to auscultation without rales, wheezing or rhonchi  ABDOMEN: Soft, non-tender, non-distended Extremities: No pitting pedal edema. Pulses bilaterally symmetric with radial 2+ and dorsalis pedis 2+ NEUROLOGIC:  Alert and oriented x 3  Medication Adjustments/Labs and Tests Ordered: Current  medicines are reviewed at length with the patient today.  Concerns regarding medicines are outlined above.  Orders Placed This Encounter  Procedures   EKG 12-Lead   ECHOCARDIOGRAM COMPLETE   Meds ordered this encounter  Medications   metoprolol tartrate (LOPRESSOR) 25 MG tablet    Sig: Take 1 tablet (25 mg total) by mouth 2 (two) times daily.    Dispense:  180 tablet    Refill:  3    Signed, Inga Noller reddy Dionel Archey, MD, MPH, [facility]. 03/13/2023 2:54 PM    [facility]

## 2023-03-13 NOTE — Assessment & Plan Note (Signed)
Sinus versus atrial tachycardia on event monitor 14-day study from 02/17/2023. Triggered with positional changes. Near syncopal and syncopal events reported recently. Symptoms recently worse over the past 8 months. Average heart rate sinus rhythm 86/min [ranging from 37 bpm to 147 bpm].   Advised to avoid any stimulants. Advised to drink plenty of fluids through the day, encouraged to drink water.  Will obtain transthoracic echocardiogram to rule out any cardiac structural and functional issues.  Will start low-dose metoprolol tartrate 25 mg twice daily. Reviewed potential side effects. Advised to cut down the dose to half if she has any symptoms of lightheadedness or dizziness once she starts taking the medication.  She has a smart watch, [employer] brand, able to take EKGs and also able to monitor her resting and walking heart rates.  Will review her interval symptoms and echocardiogram results at subsequent follow-up visit in 2 months.

## 2023-03-14 LAB — IRON AND IRON BINDING CAPACITY (CC-WL,HP ONLY)
Iron: 60 ug/dL (ref 28–170)
Saturation Ratios: 14 % (ref 10.4–31.8)
TIBC: 445 ug/dL (ref 250–450)
UIBC: 385 ug/dL (ref 148–442)

## 2023-03-16 ENCOUNTER — Other Ambulatory Visit: Payer: Self-pay | Admitting: Family Medicine

## 2023-03-16 ENCOUNTER — Encounter: Payer: Self-pay | Admitting: Family Medicine

## 2023-03-16 MED ORDER — TIRZEPATIDE 12.5 MG/0.5ML ~~LOC~~ SOAJ: 12.5000 mg | SUBCUTANEOUS | 0 refills | Status: DC

## 2023-03-16 MED ORDER — TIRZEPATIDE 15 MG/0.5ML ~~LOC~~ SOAJ: 15.0000 mg | SUBCUTANEOUS | 2 refills | Status: DC

## 2023-03-16 MED ORDER — TIRZEPATIDE 10 MG/0.5ML ~~LOC~~ SOAJ: 10.0000 mg | SUBCUTANEOUS | 0 refills | Status: DC

## 2023-03-17 ENCOUNTER — Ambulatory Visit (INDEPENDENT_AMBULATORY_CARE_PROVIDER_SITE_OTHER): Admitting: Family Medicine

## 2023-03-17 ENCOUNTER — Encounter: Payer: Self-pay | Admitting: Family Medicine

## 2023-03-17 VITALS — BP 110/76 | HR 84 | Temp 98.0°F | Resp 16 | Ht 66.0 in | Wt 261.0 lb

## 2023-03-17 DIAGNOSIS — Z7985 Long-term (current) use of injectable non-insulin antidiabetic drugs: Secondary | ICD-10-CM | POA: Diagnosis not present

## 2023-03-17 DIAGNOSIS — T7840XA Allergy, unspecified, initial encounter: Secondary | ICD-10-CM

## 2023-03-17 DIAGNOSIS — E1165 Type 2 diabetes mellitus with hyperglycemia: Secondary | ICD-10-CM

## 2023-03-17 MED ORDER — TIRZEPATIDE 10 MG/0.5ML ~~LOC~~ SOAJ: 10.0000 mg | SUBCUTANEOUS | 0 refills | Status: AC

## 2023-03-17 MED ORDER — EPINEPHRINE 0.3 MG/0.3ML IJ SOAJ: 0.3000 mg | INTRAMUSCULAR | 2 refills | Status: DC | PRN

## 2023-03-17 MED ORDER — TIRZEPATIDE 15 MG/0.5ML ~~LOC~~ SOAJ: 15.0000 mg | SUBCUTANEOUS | 2 refills | Status: DC

## 2023-03-17 MED ORDER — TIRZEPATIDE 12.5 MG/0.5ML ~~LOC~~ SOAJ: 12.5000 mg | SUBCUTANEOUS | 0 refills | Status: AC

## 2023-03-17 NOTE — Progress Notes (Signed)
Chief Complaint  Patient presents with   Allergic Reaction    Discuss Allergic    Subjective: Patient is a 42 y.o. female here for food allergies.  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 It makes her throw up over the past 3 weeks. She has also had random bouts of hives. She take Benadryl and goes to sleep which resolves the issue. She gets itchy throat sometimes when she eats, specifically tomato-based or dairy products.  She takes benadryl as needed and Claritin daily as needed.   Past Medical History:  Diagnosis Date   Anxiety    Asthma    Depression    GERD (gastroesophageal reflux disease)    History of chicken pox    History of migraine headaches    Migraines    Pituitary adenoma (HCC)    Seizures (HCC)     Objective: BP 110/76   Pulse 84   Temp 98 F (36.7 C) (Oral)   Resp 16   Ht 5\' 6"  (1.676 m)   Wt 261 lb (118.4 kg)   SpO2 97%   BMI 42.13 kg/m  General: Awake, appears stated age Mouth: MMM, no edema or asymmetry Heart: RRR, no LE edema Lungs: CTAB, no rales, wheezes or rhonchi. No accessory muscle use Skin: No lesions on exposed skin Psych: Age appropriate judgment and insight, normal affect and mood  Assessment and Plan: Allergy, initial encounter - Plan: Food Allergy Profile, EPINEPHrine (EPIPEN 2-PAK) 0.3 mg/0.3 mL IJ SOAJ injection  Type 2 diabetes mellitus with hyperglycemia, without long-term current use of insulin (HCC) - Plan: tirzepatide (MOUNJARO) 12.5 MG/0.5ML Pen, tirzepatide (MOUNJARO) 10 MG/0.5ML Pen, tirzepatide (MOUNJARO) 15 MG/0.5ML Pen  EpiPen as above. Ck food allergy panel. Benadryl as needed. No active reaction. Seek immediate care if worsening ss's or if she has to use EpiPen.  Reorder Greggory Keen w ICD10 code at her request.  The patient voiced understanding and agreement to the plan.  Jilda Roche [place], DO 03/17/23  3:26 PM

## 2023-03-17 NOTE — Patient Instructions (Addendum)
Let me know if there are any issues with the [facility].   Keep the diet clean and stay active.  Give [place] 4-5 business days to get the results of your labs back.   Pepcid/famotidine 20 mg 1-2 times daily can help with stomach symptoms.  Avoid triggers.    Strong work with your weight loss.   Claritin (loratadine), Allegra (fexofenadine), Zyrtec (cetirizine) which is also equivalent to Xyzal (levocetirizine); these are listed in order from weakest to strongest. Generic, and therefore cheaper, options are in the parentheses.   Flonase (fluticasone); nasal spray that is over the counter. 2 sprays each nostril, once daily. Aim towards the same side eye when you spray.  There are available OTC, and the generic versions, which may be cheaper, are in parentheses. Show this to a pharmacist if you have trouble finding any of these items.  Let us know if you need anything.

## 2023-03-20 LAB — FOOD ALLERGY PROFILE
Allergen, Salmon, f41: <0.1 kU/L
Almonds: 0.17 kU/L — ABNORMAL HIGH
CLASS: 0
CLASS: 0
CLASS: 0
Cashew IgE: 0.11 kU/L — ABNORMAL HIGH
Class: 0
Class: 0
Egg White IgE: <0.1 kU/L
Fish Cod: <0.1 kU/L
Hazelnut: 0.14 kU/L — ABNORMAL HIGH
Milk IgE: <0.1 kU/L
Peanut IgE: 0.2 kU/L — ABNORMAL HIGH
Scallop IgE: 0.14 kU/L — ABNORMAL HIGH
Sesame Seed f10: 0.2 kU/L — ABNORMAL HIGH
Shrimp IgE: 0.29 kU/L — ABNORMAL HIGH
Soybean IgE: 0.14 kU/L — ABNORMAL HIGH
Tuna IgE: <0.1 kU/L
Walnut: 0.14 kU/L — ABNORMAL HIGH
Wheat IgE: 0.18 kU/L — ABNORMAL HIGH

## 2023-03-20 LAB — INTERPRETATION:

## 2023-03-24 ENCOUNTER — Ambulatory Visit: Admitting: Dietician

## 2023-03-26 ENCOUNTER — Other Ambulatory Visit: Payer: Self-pay | Admitting: Neurology

## 2023-04-13 ENCOUNTER — Ambulatory Visit (HOSPITAL_BASED_OUTPATIENT_CLINIC_OR_DEPARTMENT_OTHER)
Admission: RE | Admit: 2023-04-13 | Discharge: 2023-04-13 | Disposition: A | Discharge status: Home / Self Care | Source: Ambulatory Visit

## 2023-04-13 DIAGNOSIS — I479 Paroxysmal tachycardia, unspecified: Secondary | ICD-10-CM | POA: Diagnosis present

## 2023-04-13 DIAGNOSIS — I10 Essential (primary) hypertension: Secondary | ICD-10-CM

## 2023-04-13 LAB — ECHOCARDIOGRAM COMPLETE
AR max vel: 3.51 cm2
AV Area VTI: 3.28 cm2
AV Area mean vel: 3.26 cm2
AV Mean grad: 5 mm[Hg]
AV Peak grad: 8.1 mm[Hg]
Ao pk vel: 1.42 m/s
Area-P 1/2: 4.1 cm2
Calc EF: 67.3 %
S' Lateral: 3.1 cm
Single Plane A2C EF: 65.7 %
Single Plane A4C EF: 67.9 %

## 2023-04-14 ENCOUNTER — Ambulatory Visit: Payer: Self-pay | Admitting: Family Medicine

## 2023-04-14 NOTE — Telephone Encounter (Signed)
   Chief Complaint: fall Symptoms: left arm bruise Frequency: once  Disposition: [] ED /[] Urgent Care (no appt availability in office) / [] Appointment(In office/virtual)/ []  [facility]/ [] Home Care/ [] Refused Recommended Disposition /[] [facility]/ [x]  Follow-up with PCP Additional Notes: Pt called needing follow-up from ortho app earlier today. Pt fell last night and woke up on floor. Pt stated she wasn't "knocked out" long because her watch was questioning if 911 needed to be called. Husband was home with pt. Pt stated they think she has POTS  but it isn't  a certain diagnosis as of now. Pt landed on left arm and currently has a bruise above elbow. Pt had x-ray and nothing is broken.  Pt would also like to discuss having an A1C for an upcoming surgery. Per protocol, pt to be seen within 3 days. Pt has appt with provider on 1/27 @ 1415. RN gave care advice and pt verbalized understanding.           Copied from CRM [id number]. Topic: Clinical - Red Word Triage >> Apr 14, 2023  2:22 PM Alvino Blood C wrote: Red Word that prompted transfer to Nurse Triage: Patient fell and hit her arm and its bruised and causing pain Reason for Disposition  [1] Fall AND [2] went to emergency department for evaluation or treatment  Answer Assessment - Initial Assessment Questions 1. MECHANISM: "How did the fall happen?"     Pt fell opening baby gate 2. DOMESTIC VIOLENCE AND ELDER ABUSE SCREENING: "Did you fall because someone pushed you or tried to hurt you?" If Yes, ask: "Are you safe now?"     no 3. ONSET: "When did the fall happen?" (e.g., minutes, hours, or days ago)     Last night 4. LOCATION: "What part of the body hit the ground?" (e.g., back, buttocks, head, hips, knees, hands, head, stomach)     Left arm 5. INJURY: "Did you hurt (injure) yourself when you fell?" If Yes, ask: "What did you injure? Tell me more about this?" (e.g., body area; type of injury; pain severity)"     Yes,  left arm 6. PAIN: "Is there any pain?" If Yes, ask: "How bad is the pain?" (e.g., Scale 1-10; or mild,  moderate, severe)   - NONE (0): No pain   - MILD (1-3): Doesn't interfere with normal activities    - MODERATE (4-7): Interferes with normal activities or awakens from sleep    - SEVERE (8-10): Excruciating pain, unable to do any normal activities      mild 7. SIZE: For cuts, bruises, or swelling, ask: "How large is it?" (e.g., inches or centimeters)      na  9. OTHER SYMPTOMS: "Do you have any other symptoms?" (e.g., dizziness, fever, weakness; new onset or worsening).      denies 10. CAUSE: "What do you think caused the fall (or falling)?" (e.g., tripped, dizzy spell)       Possibly POTS  Protocols used: Falls and [facility]

## 2023-04-17 ENCOUNTER — Encounter: Payer: Self-pay | Admitting: Family Medicine

## 2023-04-17 ENCOUNTER — Inpatient Hospital Stay (HOSPITAL_BASED_OUTPATIENT_CLINIC_OR_DEPARTMENT_OTHER): Admitting: Medical Oncology

## 2023-04-17 ENCOUNTER — Other Ambulatory Visit: Payer: Self-pay | Admitting: Medical Oncology

## 2023-04-17 ENCOUNTER — Inpatient Hospital Stay: Attending: Medical Oncology

## 2023-04-17 ENCOUNTER — Encounter: Payer: Self-pay | Admitting: Medical Oncology

## 2023-04-17 ENCOUNTER — Ambulatory Visit (INDEPENDENT_AMBULATORY_CARE_PROVIDER_SITE_OTHER): Admitting: Family Medicine

## 2023-04-17 VITALS — BP 112/74 | HR 107 | Temp 98.0°F | Resp 16 | Ht 66.0 in | Wt 248.4 lb

## 2023-04-17 VITALS — BP 98/59 | HR 98 | Temp 98.1°F | Resp 18 | Ht 66.0 in | Wt 247.0 lb

## 2023-04-17 DIAGNOSIS — R61 Generalized hyperhidrosis: Secondary | ICD-10-CM

## 2023-04-17 DIAGNOSIS — D509 Iron deficiency anemia, unspecified: Secondary | ICD-10-CM

## 2023-04-17 DIAGNOSIS — D72829 Elevated white blood cell count, unspecified: Secondary | ICD-10-CM | POA: Insufficient documentation

## 2023-04-17 DIAGNOSIS — R7989 Other specified abnormal findings of blood chemistry: Secondary | ICD-10-CM

## 2023-04-17 DIAGNOSIS — E1165 Type 2 diabetes mellitus with hyperglycemia: Secondary | ICD-10-CM

## 2023-04-17 DIAGNOSIS — I479 Paroxysmal tachycardia, unspecified: Secondary | ICD-10-CM | POA: Diagnosis not present

## 2023-04-17 DIAGNOSIS — Z91018 Allergy to other foods: Secondary | ICD-10-CM | POA: Diagnosis not present

## 2023-04-17 DIAGNOSIS — E639 Nutritional deficiency, unspecified: Secondary | ICD-10-CM

## 2023-04-17 LAB — CBC
HCT: 39.7 % (ref 36.0–46.0)
Hemoglobin: 13.4 g/dL (ref 12.0–15.0)
MCH: 27.6 pg (ref 26.0–34.0)
MCHC: 33.8 g/dL (ref 30.0–36.0)
MCV: 81.9 fL (ref 80.0–100.0)
Platelets: 409 10*3/uL — ABNORMAL HIGH (ref 150–400)
RBC: 4.85 MIL/uL (ref 3.87–5.11)
RDW: 18.9 % — ABNORMAL HIGH (ref 11.5–15.5)
WBC: 11.1 10*3/uL — ABNORMAL HIGH (ref 4.0–10.5)
nRBC: 0 % (ref 0.0–0.2)

## 2023-04-17 LAB — FERRITIN: Ferritin: 53 ng/mL (ref 11–307)

## 2023-04-17 LAB — HEMOGLOBIN A1C: Hgb A1c MFr Bld: 5.9 % (ref 4.6–6.5)

## 2023-04-17 LAB — VITAMIN B12: Vitamin B-12: 114 pg/mL — ABNORMAL LOW (ref 180–914)

## 2023-04-17 LAB — FOLATE: Folate: 8 ng/mL (ref >5.9)

## 2023-04-17 NOTE — Progress Notes (Unsigned)
Hematology and Oncology Follow Up Visit  Amber [name] [id number] 24-Oct-1980 43 y.o. 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 GI evaluation was recommended given her family history  (colon cancer of father). She was also start on IV venofer and is S/P one cycle of treatment.   She is here for follow up today. She reports that she has been fair since last visit. Her POTS has been giving her more dizziness. She is working with cardiology. Fatigue is stable. She is now getting night sweats for the last month. BP is low during the night which may be related. Last TSH was on 01/06/2023 (0.87).   She reports tolerating the IV iron well without side effects.   There has been no bleeding to her knowledge aside from her menstrual cycles: denies epistaxis, gingivitis, hemoptysis, hematemesis, hematuria, melena, excessive bruising, blood donation.   Menstrual cycles are regular   Wt Readings from Last 3 Encounters:  04/17/23 247 lb (112 kg)  04/17/23 248 lb 6.4 oz (112.7 kg)  03/17/23 261 lb (118.4 kg)     Medications:   Current Outpatient Medications:    acetaminophen (TYLENOL) 500 MG tablet, Take 500 mg by mouth as needed., Disp: , Rfl:    cabergoline (DOSTINEX) 0.5 MG tablet, 2.5 mg weekly (Two tabs on Mondays, 1 tab on wednesdays and 2 tabs on Fridays), Disp: 65 tablet, Rfl:  3   Continuous Glucose Sensor (DEXCOM G7 SENSOR) MISC, Use daily to check blood sugar,  DXE11.65, Disp: 1 each, Rfl: 11   escitalopram (LEXAPRO) 10 MG tablet, TAKE ONE TABLET BY MOUTH EVERY DAY, Disp: 90 tablet, Rfl: 3   famotidine (PEPCID) 20 MG tablet, Take 20 mg by mouth 2 (two) times daily., Disp: , Rfl:    fenofibrate (TRICOR) 145 MG tablet, Take 1 tablet (145 mg total) by mouth daily., Disp: 90 tablet, Rfl: 3   gabapentin (NEURONTIN) 300 MG capsule, Take 1 capsule by mouth at bedtime., Disp: , Rfl:    Galcanezumab-gnlm (EMGALITY) 120 MG/ML SOAJ, Inject 120 mg into the skin every 21 ( twenty-one) days., Disp: 1.12 mL, Rfl: 11   glucose blood (ACCU-CHEK GUIDE) test strip, 1 each by Other route daily in the afternoon. Use as instructed, Disp: 100 each, Rfl: 3   metformin (FORTAMET) 1000 MG (OSM) 24 hr tablet, Take 1 tablet (1,000 mg total) by mouth 2 (two) times daily with a meal., Disp: 180 tablet, Rfl: 3   metoprolol tartrate (LOPRESSOR) 25 MG tablet, Take 1 tablet (25 mg total) by mouth 2 (two) times daily., Disp: 180 tablet, Rfl: 3   ondansetron (ZOFRAN) 8 MG tablet, Take 1 tablet (8 mg total) by mouth as needed for nausea or vomiting., Disp: 60 tablet, Rfl: 1   pantoprazole (PROTONIX) 40 MG tablet, Take 1 tablet (40 mg  total) by mouth 2 (two) times daily., Disp: 180 tablet, Rfl: 3   QUEtiapine (SEROQUEL) 300 MG tablet, Take 1 tablet (300 mg total) by mouth at bedtime., Disp: 90 tablet, Rfl: 3   Riboflavin 400 MG CAPS, Take 400 mg by mouth daily., Disp: , Rfl:    Rimegepant Sulfate (NURTEC) 75 MG TBDP, Take 1 tablet (75 mg total) by mouth as needed (Daily as needed for a Migraine). Maximum 1 tablet in 24 hours.  Quantity 8., Disp: 8 tablet, Rfl: 11   rosuvastatin (CRESTOR) 20 MG tablet, Take 1 tablet (20 mg total) by mouth daily., Disp: 90 tablet, Rfl: 3   tirzepatide (MOUNJARO) 10 MG/0.5ML Pen, Inject 10 mg into the skin once a week for 28 days., Disp: 2 mL, Rfl: 0   topiramate (TOPAMAX) 100  MG tablet, Take 1 tablet (100 mg total) by mouth at bedtime., Disp: 30 tablet, Rfl: 5   traMADol (ULTRAM) 50 MG tablet, TAKE ONE TABLET BY MOUTH EVERY 6 HOURS AS NEEDED, Disp: 30 tablet, Rfl: 1   EPINEPHrine (EPIPEN 2-PAK) 0.3 mg/0.3 mL IJ SOAJ injection, Inject 0.3 mg into the muscle as needed for anaphylaxis. (Patient not taking: Reported on 04/17/2023), Disp: 1 each, Rfl: 2   [START ON 04/20/2023] tirzepatide (MOUNJARO) 12.5 MG/0.5ML Pen, Inject 12.5 mg into the skin once a week for 28 days. (Patient not taking: Reported on 04/17/2023), Disp: 2 mL, Rfl: 0   [START ON 05/18/2023] tirzepatide (MOUNJARO) 15 MG/0.5ML Pen, Inject 15 mg into the skin once a week. (Patient not taking: Reported on 04/17/2023), Disp: 2 mL, Rfl: 2  Allergies:  Allergies  Allergen Reactions   Bee Venom Anaphylaxis   Chlorhexidine Hives, Itching and Rash   Ibuprofen Other (See Comments)    GI bleed   Iodine Hives, Itching and Rash   Shellfish Allergy Shortness Of Breath, Swelling and Other (See Comments)    Tingling in the lips, mouth and throat   Sulfa Antibiotics Anaphylaxis   Triptans Other (See Comments)    Makes pain worse. Pain in joints   Lunesta [Eszopiclone] Nausea And Vomiting    GI issues   Aspirin Other (See Comments)    UNKNOWN REACTION. Her parents and siblings are allergic to Aspirin.   Nsaids Nausea And Vomiting   Tape Rash    Blisters     Past Medical History, Surgical history, Social history, and Family History were reviewed and updated.  Review of Systems: As stated above in HPI   Physical Exam:  height is 5\' 6"  (1.676 m) and weight is 247 lb (112 kg). Her oral temperature is 98.1 F (36.7 C). Her blood pressure is 98/59 (abnormal) and her pulse is 98. Her respiration is 18 and oxygen saturation is 100%.   Physical Exam General: NAD Cardiovascular: regular rate and rhythm Pulmonary: clear ant fields Abdomen: soft, nontender, + bowel sounds GU: no suprapubic tenderness Extremities: no  edema, no joint deformities Skin: no rashes Neurological: Weakness but otherwise nonfocal   Lab Results  Component Value Date   WBC 11.1 (H) 04/17/2023   HGB 13.4 04/17/2023   HCT 39.7 04/17/2023   MCV 81.9 04/17/2023   PLT 409 (H) 04/17/2023     Chemistry      Component Value Date/Time   NA 138 02/08/2023 1508   K 3.6 02/08/2023 1508   CL 111 02/08/2023 1508   CO2 20 (L) 02/08/2023 1508   BUN 14 02/08/2023 1508   CREATININE 1.15 (H) 02/08/2023 1508   CREATININE 0.99  12/24/2019 1316      Component Value Date/Time   CALCIUM 9.1 02/08/2023 1508   ALKPHOS 43 01/26/2023 2236   AST 29 01/26/2023 2236   ALT 27 01/26/2023 2236   BILITOT 0.5 01/26/2023 2236     Encounter Diagnoses  Name Primary?   Night sweats Yes   Leukocytosis, unspecified type     Assessment and Plan- Patient is a 43 y.o. female with multifactorial anemia. 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 She also has a history of nutritional deficiencies.   Today I have reviewed her blood smear with Dr. Myna Hidalgo. No significant findings of concern. We will continue to watch her levels. Likely will see improvement with her B12 injections.   We will follow her closely at this time. We will plan to see her in 1 month or sooner as needed.   Disposition: B12 today B12 weekly x 4 then monthly x 12 RTC 1 month APP, labs, B12   Clent Jacks PA-C 1/27/20254:01 PM

## 2023-04-17 NOTE — Progress Notes (Signed)
No chief complaint on file.   Subjective: Patient is a 43 y.o. female here for passing out.  3 d ago, got up and passed out.  She has a history of POTS versus paroxysmal tachycardia.  She does follow with cardiology.  She had up faster than she normally does.  She reports possibly being dehydrated.  Eating and drinking normally otherwise.  No recent illness or gastrointestinal distress.  She was prescribed metoprolol by her cardiologist but she is nervous to take this.  She was placed on propranolol for migraine prophylaxis several years ago and did lower her blood pressure to the point where she was losing consciousness and blood pressure was dropping.  Normally her systolic blood pressure is less than 100.  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  Last A1c was 11.6.  Since that time, we have started her on Mounjaro.  She reports compliance and no adverse effects.  Sugars been running in the low 100s on average.  Patient gyms.  Had nausea/vomiting in addition to an itchy rash over her anterior chest.  She took Benadryl with resolution of symptoms.  She has been doing her best to avoid food triggers.  She has never seen an allergist.  No swelling in her mouth or shortness of breath.  Past Medical History:  Diagnosis Date   Anxiety    Asthma    Depression    GERD (gastroesophageal reflux disease)    History of chicken pox    History of migraine headaches    Migraines    Pituitary adenoma (HCC)    Seizures (HCC)     Objective: BP 112/74   Pulse (!) 107   Temp 98 F (36.7 C) (Oral)   Resp 16   Ht 5\' 6"  (1.676 m)   Wt 248 lb 6.4 oz (112.7 kg)   SpO2 98%   BMI 40.09 kg/m  General: Awake, appears stated age Heart: Tachycardic regular rhythm, no LE edema, no bruits Lungs: CTAB, no rales, wheezes or rhonchi. No accessory muscle use Psych: Age appropriate judgment and insight, normal affect and mood Neuro: Gait is normal  Assessment and Plan: Type 2 diabetes mellitus with  hyperglycemia, without long-term current use of insulin (HCC) - Plan: Hemoglobin A1c  Paroxysmal tachycardia (HCC)  Food allergy - Plan: Ambulatory referral to Allergy  Check A1c, continue Mounjaro. Continue adjunctive cardiology expressing concern with beta-blocker. Refer to the allergy team. The patient voiced understanding and agreement to the plan.  I spent 35 minutes with the patient discussing the above plans in addition to reviewing her chart on the same day of the exam.  Sharlene Dory, DO 04/17/23  2:22 PM

## 2023-04-17 NOTE — Patient Instructions (Signed)
Give [place] 2-3 business days to get the results of your labs back.   Send your heart doc a message relaying him what happened when you were on propranolol for headaches. Ask him for an alternative.   Stay hydrated.  Let us know if you need anything.

## 2023-04-18 ENCOUNTER — Encounter: Payer: Self-pay | Admitting: Medical Oncology

## 2023-04-18 ENCOUNTER — Other Ambulatory Visit: Payer: Self-pay | Admitting: Medical Oncology

## 2023-04-18 LAB — IRON AND IRON BINDING CAPACITY (CC-WL,HP ONLY)
Iron: 75 ug/dL (ref 28–170)
Saturation Ratios: 15 % (ref 10.4–31.8)
TIBC: 518 ug/dL — ABNORMAL HIGH (ref 250–450)
UIBC: 443 ug/dL — ABNORMAL HIGH (ref 148–442)

## 2023-04-19 ENCOUNTER — Encounter: Attending: Internal Medicine | Admitting: Dietician

## 2023-04-19 ENCOUNTER — Other Ambulatory Visit: Payer: Self-pay | Admitting: Medical Oncology

## 2023-04-19 DIAGNOSIS — E1165 Type 2 diabetes mellitus with hyperglycemia: Secondary | ICD-10-CM | POA: Diagnosis present

## 2023-04-19 NOTE — Progress Notes (Signed)
Diabetes Self-Management Education  Visit Type: First/Initial  Appt. Start Time: 1645 Appt. End Time: 1750  04/19/2023  [name], identified by name and date of birth, is a 43 y.o. female with a diagnosis of Diabetes: Type 2.   ASSESSMENT   Patient is here today alone. Patient would like to learn more about dietary choices. Patient lives with husband and her mother.  Pt reports she does shopping and cooking. Pt reports she has been successfully with weight reductions and improving her A1C since staring Mounjaro. Pt reports she recently has been diagnosed with multiple food allergies. Pt reports she has omitted eating out and feels confident that she can avoid food allergies. Pt reports she has less burden using CGM verus performing finger sticks. Pt reports tolerating 15 minutes of recumbent bike 2-4 times weekly. Pt reports she has decreased sweets, avoids regular soda and increase water. Pt reports she aims for smaller meals stating large meals increase her symptoms of POTS. Pt reports she adds sodium to her meal intake.  Pt reports she is meal prepping.   Pt reports she is lactose intolerant lactaid selects lactaid milk, avoids yogurt and can tolerate cheese in small amounts. All Pt's questions were answered during this encounter.   History includes:   Past Medical History:  Diagnosis Date   Anxiety    Asthma    Depression    GERD (gastroesophageal reflux disease)    History of chicken pox    History of migraine headaches    Migraines    Pituitary adenoma (HCC)    Seizures (HCC)     Labs noted:   Lab Results  Component Value Date   HGBA1C 5.9 04/17/2023   Lab Results  Component Value Date   VITAMINB12 114 (L) 04/17/2023   Medications include:   Current Outpatient Medications:    Continuous Glucose Sensor (DEXCOM G7 SENSOR) MISC, Use daily to check blood sugar,  DXE11.65, Disp: 1 each, Rfl: 11   glucose blood (ACCU-CHEK GUIDE) test strip, 1 each by Other  route daily in the afternoon. Use as instructed, Disp: 100 each, Rfl: 3   metformin (FORTAMET) 1000 MG (OSM) 24 hr tablet, Take 1 tablet (1,000 mg total) by mouth 2 (two) times daily with a meal., Disp: 180 tablet, Rfl: 3   tirzepatide (MOUNJARO) 10 MG/0.5ML Pen, Inject 10 mg into the skin once a week for 28 days., Disp: 2 mL, Rfl: 0   acetaminophen (TYLENOL) 500 MG tablet, Take 500 mg by mouth as needed., Disp: , Rfl:    cabergoline (DOSTINEX) 0.5 MG tablet, 2.5 mg weekly (Two tabs on Mondays, 1 tab on wednesdays and 2 tabs on Fridays), Disp: 65 tablet, Rfl: 3   EPINEPHrine (EPIPEN 2-PAK) 0.3 mg/0.3 mL IJ SOAJ injection, Inject 0.3 mg into the muscle as needed for anaphylaxis. (Patient not taking: Reported on 04/17/2023), Disp: 1 each, Rfl: 2   escitalopram (LEXAPRO) 10 MG tablet, TAKE ONE TABLET BY MOUTH EVERY DAY, Disp: 90 tablet, Rfl: 3   famotidine (PEPCID) 20 MG tablet, Take 20 mg by mouth 2 (two) times daily., Disp: , Rfl:    fenofibrate (TRICOR) 145 MG tablet, Take 1 tablet (145 mg total) by mouth daily., Disp: 90 tablet, Rfl: 3   gabapentin (NEURONTIN) 300 MG capsule, Take 1 capsule by mouth at bedtime., Disp: , Rfl:    Galcanezumab-gnlm (EMGALITY) 120 MG/ML SOAJ, Inject 120 mg into the skin every 21 ( twenty-one) days., Disp: 1.12 mL, Rfl: 11   metoprolol tartrate (  LOPRESSOR) 25 MG tablet, Take 1 tablet (25 mg total) by mouth 2 (two) times daily., Disp: 180 tablet, Rfl: 3   ondansetron (ZOFRAN) 8 MG tablet, Take 1 tablet (8 mg total) by mouth as needed for nausea or vomiting., Disp: 60 tablet, Rfl: 1   pantoprazole (PROTONIX) 40 MG tablet, Take 1 tablet (40 mg total) by mouth 2 (two) times daily., Disp: 180 tablet, Rfl: 3   QUEtiapine (SEROQUEL) 300 MG tablet, Take 1 tablet (300 mg total) by mouth at bedtime., Disp: 90 tablet, Rfl: 3   Riboflavin 400 MG CAPS, Take 400 mg by mouth daily., Disp: , Rfl:    Rimegepant Sulfate (NURTEC) 75 MG TBDP, Take 1 tablet (75 mg total) by mouth as needed  (Daily as needed for a Migraine). Maximum 1 tablet in 24 hours.  Quantity 8., Disp: 8 tablet, Rfl: 11   rosuvastatin (CRESTOR) 20 MG tablet, Take 1 tablet (20 mg total) by mouth daily., Disp: 90 tablet, Rfl: 3   [START ON 04/20/2023] tirzepatide (MOUNJARO) 12.5 MG/0.5ML Pen, Inject 12.5 mg into the skin once a week for 28 days. (Patient not taking: Reported on 04/17/2023), Disp: 2 mL, Rfl: 0   [START ON 05/18/2023] tirzepatide (MOUNJARO) 15 MG/0.5ML Pen, Inject 15 mg into the skin once a week. (Patient not taking: Reported on 04/17/2023), Disp: 2 mL, Rfl: 2   topiramate (TOPAMAX) 100 MG tablet, Take 1 tablet (100 mg total) by mouth at bedtime., Disp: 30 tablet, Rfl: 5   traMADol (ULTRAM) 50 MG tablet, TAKE ONE TABLET BY MOUTH EVERY 6 HOURS AS NEEDED, Disp: 30 tablet, Rfl: 1    There were no vitals taken for this visit. There is no height or weight on file to calculate BMI.   Diabetes Self-Management Education - 04/19/23 1652       Visit Information   Visit Type First/Initial      Initial Visit   Diabetes Type Type 2    Date Diagnosed 2024    Are you currently following a meal plan? No    Are you taking your medications as prescribed? Yes      Health Coping   How would you rate your overall health? Poor      Psychosocial Assessment   Patient Belief/Attitude about Diabetes Motivated to manage diabetes    What is the hardest part about your diabetes right now, causing you the most concern, or is the most worrisome to you about your diabetes?   Being active    Self-care barriers None    Self-management support Doctor's office    Other persons present Patient    Patient Concerns Nutrition/Meal planning    Special Needs None    Preferred Learning Style No preference indicated    Learning Readiness Change in progress    How often do you need to have someone help you when you read instructions, pamphlets, or other written materials from your doctor or pharmacy? 1 - Never    What is the last  grade level you completed in school? 12th      Pre-Education Assessment   Patient understands the diabetes disease and treatment process. Needs Review    Patient understands incorporating nutritional management into lifestyle. Needs Review    Patient undertands incorporating physical activity into lifestyle. Needs Review    Patient understands using medications safely. Needs Review    Patient understands monitoring blood glucose, interpreting and using results Needs Review    Patient understands prevention, detection, and treatment of acute complications.  Needs Review    Patient understands prevention, detection, and treatment of chronic complications. Needs Review    Patient understands how to develop strategies to address psychosocial issues. Needs Review    Patient understands how to develop strategies to promote health/change behavior. Needs Review      Complications   Last HgB A1C per patient/outside source 5.9 %    How often do you check your blood sugar? > 4 times/day   CGM dexcom   Fasting Blood glucose range (mg/dL) 59-563    Postprandial Blood glucose range (mg/dL) 87-564    Number of hypoglycemic episodes per month 0    Number of hyperglycemic episodes ( >200mg /dL): Rare    Can you tell when your blood sugar is high? Yes    What do you do if your blood sugar is high? low energy    Have you had a dilated eye exam in the past 12 months? No    Have you had a dental exam in the past 12 months? No    Are you checking your feet? Yes    How many days per week are you checking your feet? 7      Dietary Intake   Breakfast 10 oz lactiad, protein powder or egg, sausage patty, GF toast    Lunch 1/3 cup rice, corn, peans, green beans, 1/2 cup of chicken breast, water    Snack (afternoon) 1/3 cup rice, corn, peans, green beans, 1/2 cup of chicken breast, water    Dinner chicken leg, carrots, potatoes    Beverage(s) 80-120 ounces water, lactiad, electrolyte water      Activity /  Exercise   Activity / Exercise Type Light (walking / raking leaves)    How many days per week do you exercise? 3    How many minutes per day do you exercise? 15    Total minutes per week of exercise 45      Patient Education   Previous Diabetes Education No    Disease Pathophysiology Definition of diabetes, type 1 and 2, and the diagnosis of diabetes    Healthy Eating Plate Method    Being Active Role of exercise on diabetes management, blood pressure control and cardiac health.    Medications Reviewed patients medication for diabetes, action, purpose, timing of dose and side effects.    Monitoring Identified appropriate SMBG and/or A1C goals.    Acute complications Taught prevention, symptoms, and  treatment of hypoglycemia - the 15 rule.    Chronic complications Relationship between chronic complications and blood glucose control    Diabetes Stress and Support Role of stress on diabetes    Lifestyle and Health Coping Helped patient develop diabetes management plan for (enter comment)   POTS, food allergies     Individualized Goals (developed by patient)   Nutrition General guidelines for healthy choices and portions discussed    Physical Activity Exercise 3-5 times per week    Medications take my medication as prescribed    Monitoring  Consistenly use CGM    Problem Solving Eating Pattern    Reducing Risk do foot checks daily;examine blood glucose patterns;treat hypoglycemia with 15 grams of carbs if blood glucose less than 70mg /dL    Health Coping Ask for help with psychological, social, or emotional issues      Post-Education Assessment   Patient understands the diabetes disease and treatment process. Comprehends key points    Patient understands incorporating nutritional management into lifestyle. Comprehends key points    Patient  undertands incorporating physical activity into lifestyle. Comprehends key points    Patient understands using medications safely. Comphrehends key  points    Patient understands monitoring blood glucose, interpreting and using results Comprehends key points    Patient understands prevention, detection, and treatment of acute complications. Comprehends key points    Patient understands prevention, detection, and treatment of chronic complications. Needs Review    Patient understands how to develop strategies to address psychosocial issues. Needs Review    Patient understands how to develop strategies to promote health/change behavior. Needs Review      Outcomes   Expected Outcomes Demonstrated interest in learning. Expect positive outcomes    Future DMSE 3-4 months    Program Status Not Completed             Individualized Plan for Diabetes Self-Management Training:   Learning Objective:  Patient will have a greater understanding of diabetes self-management. Patient education plan is to attend individual and/or group sessions per assessed needs and concerns.   Plan:   There are no Patient Instructions on file for this visit.  Expected Outcomes:  Demonstrated interest in learning. Expect positive outcomes  Education material provided: Diabetes Resources  If problems or questions, patient to contact team via:  Phone  Future DSME appointment: 3-4 months

## 2023-04-19 NOTE — Patient Instructions (Signed)
Select gluten free grains

## 2023-04-20 ENCOUNTER — Ambulatory Visit: Admitting: Gastroenterology

## 2023-04-20 ENCOUNTER — Encounter: Payer: Self-pay | Admitting: Gastroenterology

## 2023-04-20 ENCOUNTER — Encounter: Payer: Self-pay | Admitting: Hematology & Oncology

## 2023-04-20 VITALS — BP 118/70 | HR 86 | Ht 66.0 in | Wt 249.0 lb

## 2023-04-20 DIAGNOSIS — K219 Gastro-esophageal reflux disease without esophagitis: Secondary | ICD-10-CM | POA: Diagnosis not present

## 2023-04-20 DIAGNOSIS — D509 Iron deficiency anemia, unspecified: Secondary | ICD-10-CM

## 2023-04-20 DIAGNOSIS — E538 Deficiency of other specified B group vitamins: Secondary | ICD-10-CM | POA: Diagnosis not present

## 2023-04-20 DIAGNOSIS — R1319 Other dysphagia: Secondary | ICD-10-CM

## 2023-04-20 DIAGNOSIS — R1310 Dysphagia, unspecified: Secondary | ICD-10-CM | POA: Diagnosis not present

## 2023-04-20 DIAGNOSIS — Z800 Family history of malignant neoplasm of digestive organs: Secondary | ICD-10-CM

## 2023-04-20 NOTE — Patient Instructions (Addendum)
_______________________________________________________  If your blood pressure at your visit was 140/90 or greater, please contact your primary care physician to follow up on this.  _______________________________________________________  If you are age 44 or older, your body mass index should be between 23-30. Your Body mass index is 40.19 kg/m. If this is out of the aforementioned range listed, please consider follow up with your Primary Care Provider.  If you are age 13 or younger, your body mass index should be between 19-25. Your Body mass index is 40.19 kg/m. If this is out of the aformentioned range listed, please consider follow up with your Primary Care Provider.   ________________________________________________________  The [facility] providers would like to encourage you to use [facility] to communicate with providers for non-urgent requests or questions.  Due to long hold times on the telephone, sending your provider a message by [facility] may be a faster and more efficient way to get a response.  Please allow 48 business hours for a response.  Please remember that this is for non-urgent requests.  _______________________________________________________  Amber [name] have been scheduled for an endoscopy and colonoscopy. Please follow the written instructions given to you at your visit today.  Please pick up your prep supplies at the pharmacy within the next 1-3 days.  If you use inhalers (even only as needed), please bring them with you on the day of your procedure.  DO NOT TAKE 7 DAYS PRIOR TO TEST- Trulicity (dulaglutide) Ozempic, Wegovy (semaglutide) Mounjaro (tirzepatide) Bydureon Bcise (exanatide extended release)  DO NOT TAKE 1 DAY PRIOR TO YOUR TEST Rybelsus (semaglutide) Adlyxin (lixisenatide) Victoza (liraglutide) Byetta (exanatide) ___________________________________________________________________________  Due to recent changes in healthcare laws, you may see the  results of your imaging and laboratory studies on MyChart before your provider has had a chance to review them.  We understand that in some cases there may be results that are confusing or concerning to you. Not all laboratory results come back in the same time frame and the provider may be waiting for multiple results in order to interpret others.  Please give [place] 48 hours in order for your provider to thoroughly review all the results before contacting the office for clarification of your results.   It was a pleasure to see you today!  Vito Cirigliano, D.O.

## 2023-04-20 NOTE — Progress Notes (Signed)
o  Chief Complaint:    Dysphagia, iron deficiency anemia, B12 deficiency  GI History: 43 y.o. 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  Longstanding history of reflux starting at age 9. Characterized by HB, regurgitation. Worse with spicy foods. Previously treated with Zantac, and changed to Pepcid in 2020.  EGD in high school for hematemesis in the setting of NSAIDs for pulled muscle. EGD n/f H pylori ulcer, treated w/ Abx. CCY in 2012 for GB stones.    FHx n/f father with CRC diagnosed in his 50's, with recurrence of stage IV metastatic disease at age 39. Brother with sclerosing mesenteritis. Sister with ?splenic mass requiring splenectomy.    Endoscopic history: -EGD (01/2019, Dr. Barron Alvine): Reflux changes in lower esophagus without intestinal metaplasia, no stricture, empiric dilation with 54 Fr Maloney, moderate Non-H. pylori gastritis.  Trialed high-dose PPI   HPI:     Patient is a 43 y.o. female presenting to the Gastroenterology Clinic for evaluation of dysphagia.  Intermittent solid food dysphagia, pointing to anterior neck/suprasternal notch. +pill dysphagia. Sxs for the last 2-3 months.  No associated weight loss, night sweats, fever.  Reflux sxs otherwise well controlled with Protonix 40mg  BID with rare use of Tums for breakthrough.   Separately, being evaluated by Hematology for iron deficiency anemia.  Suspected 2/2 combination of menorrhagia and possibly malabsorption from chronic PPI use.  Has been treated with IV Venofer and recommended to follow-up in the GI clinic.  Most recent labs from earlier this week show good response with normalization of iron indices and repeat H/H 13.4/39.7.  B12 low at 114 and starting on B12 injections next week  Was last seen by me in the office on 02/05/2019.  Had  recommended colonoscopy for early CRC screening due to family history.  Patient canceled and never rescheduled.  Being followed in the Cardiology Clinic for paroxysmal tachycardia vs POTS. TTE normal earlier this year. Has follow-up with Cardiology on 05/24/23.   Started Mounjaro in 12/2022, down 40# and decreasing A1c 11.6 --> 5.9.   Has appt with Dr. Marlynn Perking in the Allergy Clinic on 05/09/2023.   Review of systems:     No chest pain, no SOB, no fevers, no urinary sx   Past Medical History:  Diagnosis Date   Anxiety    Asthma    Depression    Diabetes (HCC)    GERD (gastroesophageal reflux disease)    History of chicken pox    History of migraine headaches    Migraines    Pituitary adenoma (HCC)    Seizures (HCC)     Patient's surgical history, family medical history, social history, medications and allergies were all reviewed in Epic    Current Outpatient Medications  Medication Sig Dispense Refill   acetaminophen (TYLENOL) 500 MG tablet Take 500 mg by mouth as needed.     cabergoline (DOSTINEX) 0.5 MG tablet 2.5 mg weekly (Two tabs on Mondays, 1 tab on wednesdays and 2 tabs on Fridays) 65 tablet 3   Continuous Glucose Sensor (DEXCOM G7 SENSOR) MISC Use daily to check blood sugar,  DXE11.65 1 each 11   EPINEPHrine (EPIPEN 2-PAK) 0.3 mg/0.3 mL IJ SOAJ injection Inject 0.3 mg into the muscle as needed for anaphylaxis. 1 each 2   escitalopram (LEXAPRO) 10 MG tablet TAKE ONE TABLET BY MOUTH EVERY DAY 90 tablet 3   famotidine (  PEPCID) 20 MG tablet Take 20 mg by mouth 2 (two) times daily.     fenofibrate (TRICOR) 145 MG tablet Take 1 tablet (145 mg total) by mouth daily. 90 tablet 3   gabapentin (NEURONTIN) 300 MG capsule Take 1 capsule by mouth at bedtime.     Galcanezumab-gnlm (EMGALITY) 120 MG/ML SOAJ Inject 120 mg into the skin every 21 ( twenty-one) days. 1.12 mL 11   glucose blood (ACCU-CHEK GUIDE) test strip 1 each by Other route daily in the afternoon. Use as instructed 100  each 3   metformin (FORTAMET) 1000 MG (OSM) 24 hr tablet Take 1 tablet (1,000 mg total) by mouth 2 (two) times daily with a meal. 180 tablet 3   metoprolol tartrate (LOPRESSOR) 25 MG tablet Take 1 tablet (25 mg total) by mouth 2 (two) times daily. 180 tablet 3   ondansetron (ZOFRAN) 8 MG tablet Take 1 tablet (8 mg total) by mouth as needed for nausea or vomiting. 60 tablet 1   pantoprazole (PROTONIX) 40 MG tablet Take 1 tablet (40 mg total) by mouth 2 (two) times daily. 180 tablet 3   QUEtiapine (SEROQUEL) 300 MG tablet Take 1 tablet (300 mg total) by mouth at bedtime. 90 tablet 3   Riboflavin 400 MG CAPS Take 400 mg by mouth daily.     Rimegepant Sulfate (NURTEC) 75 MG TBDP Take 1 tablet (75 mg total) by mouth as needed (Daily as needed for a Migraine). Maximum 1 tablet in 24 hours.  Quantity 8. 8 tablet 11   rosuvastatin (CRESTOR) 20 MG tablet Take 1 tablet (20 mg total) by mouth daily. 90 tablet 3   tirzepatide (MOUNJARO) 10 MG/0.5ML Pen Inject 10 mg into the skin once a week for 28 days. 2 mL 0   topiramate (TOPAMAX) 100 MG tablet Take 1 tablet (100 mg total) by mouth at bedtime. 30 tablet 5   traMADol (ULTRAM) 50 MG tablet TAKE ONE TABLET BY MOUTH EVERY 6 HOURS AS NEEDED 30 tablet 1   tirzepatide (MOUNJARO) 12.5 MG/0.5ML Pen Inject 12.5 mg into the skin once a week for 28 days. (Patient not taking: Reported on 04/20/2023) 2 mL 0   [START ON 05/18/2023] tirzepatide (MOUNJARO) 15 MG/0.5ML Pen Inject 15 mg into the skin once a week. (Patient not taking: Reported on 04/20/2023) 2 mL 2   No current facility-administered medications for this visit.    Physical Exam:     BP 118/70   Pulse 86   Ht 5\' 6"  (1.676 m)   Wt 249 lb (112.9 kg)   BMI 40.19 kg/m   GENERAL:  Pleasant female in NAD PSYCH: : Cooperative, normal affect Musculoskeletal:  Normal muscle tone, normal strength NEURO: Alert and oriented x 3, no focal neurologic deficits   IMPRESSION and PLAN:    1) Dysphagia - EGD to  evaluate for luminal narrowing, stricture, etc. with esophageal dilation and/or biopsy is appropriate - Cut food into small pieces, chew thoroughly, and drink plenty fluids with meals  2) GERD - Well-controlled on current therapy - Evaluate for erosive esophagitis, LES laxity, hiatal hernia time of EGD as above  3) Iron deficiency anemia - EGD with duodenal biopsies - Colonoscopy - Good response to IV iron - Continue follow-up in the Hematology clinic  4) B12 deficiency - EGD with gastric biopsies along with colonoscopy as above - Starting on B12 injections next week - Continue follow-up in the Hematology clinic  5) Diabetes - Hold Mounjaro 1 week prior to starting bowel  preparation   6) Family history of colon cancer Father diagnosed with CRC in his 75s, then recurrence of stage IV metastatic cancer at age 20 - Starting CRC screening with colonoscopy as above  The indications, risks, and benefits of EGD and colonoscopy were explained to the patient in detail. Risks include but are not limited to bleeding, perforation, adverse reaction to medications, and cardiopulmonary compromise. Sequelae include but are not limited to the possibility of surgery, hospitalization, and mortality. The patient verbalized understanding and wished to proceed. All questions answered, referred to scheduler and bowel prep ordered. Further recommendations pending results of the exam.    Shellia Cleverly ,DO, FACG 04/20/2023, 2:28 PM

## 2023-04-24 ENCOUNTER — Inpatient Hospital Stay: Attending: Medical Oncology

## 2023-04-24 VITALS — BP 113/68 | HR 95 | Temp 97.9°F | Resp 20

## 2023-04-24 DIAGNOSIS — E538 Deficiency of other specified B group vitamins: Secondary | ICD-10-CM | POA: Insufficient documentation

## 2023-04-24 DIAGNOSIS — D509 Iron deficiency anemia, unspecified: Secondary | ICD-10-CM | POA: Insufficient documentation

## 2023-04-24 MED ORDER — CYANOCOBALAMIN 1000 MCG/ML IJ SOLN
1000.0000 ug | Freq: Once | INTRAMUSCULAR | Status: AC
  Administered 2023-04-24: 1000 ug via INTRAMUSCULAR
  Filled 2023-04-24: qty 1

## 2023-04-24 NOTE — Patient Instructions (Signed)
 Vitamin B12 Injection What is this medication? Vitamin B12 (VAHY tuh min B12) prevents and treats low vitamin B12 levels in your body. It is used in people who do not get enough vitamin B12 from their diet or when their digestive tract does not absorb enough. Vitamin B12 plays an important role in maintaining the health of your nervous system and red blood cells. This medicine may be used for other purposes; ask your health care provider or pharmacist if you have questions. COMMON BRAND NAME(S): B-12 Compliance Kit, B-12 Injection Kit, Cyomin, Dodex, LA-12, Nutri-Twelve, Physicians EZ Use B-12, Primabalt, Vitamin Deficiency Injectable System - B12 What should I tell my care team before I take this medication? They need to know if you have any of these conditions: Kidney disease Leber's disease Megaloblastic anemia An unusual or allergic reaction to cyanocobalamin, cobalt, other medications, foods, dyes, or preservatives Pregnant or trying to get pregnant Breast-feeding How should I use this medication? This medication is injected into a muscle or deeply under the skin. It is usually given in a clinic or care team's office. However, your care team may teach you how to inject yourself. Follow all instructions. Talk to your care team about the use of this medication in children. Special care may be needed. Overdosage: If you think you have taken too much of this medicine contact a poison control center or emergency room at once. NOTE: This medicine is only for you. Do not share this medicine with others. What if I miss a dose? If you are given your dose at a clinic or care team's office, call to reschedule your appointment. If you give your own injections, and you miss a dose, take it as soon as you can. If it is almost time for your next dose, take only that dose. Do not take double or extra doses. What may interact with this medication? Alcohol Colchicine This list may not describe all possible  interactions. Give your health care provider a list of all the medicines, herbs, non-prescription drugs, or dietary supplements you use. Also tell them if you smoke, drink alcohol, or use illegal drugs. Some items may interact with your medicine. What should I watch for while using this medication? Visit your care team regularly. You may need blood work done while you are taking this medication. You may need to follow a special diet. Talk to your care team. Limit your alcohol intake and avoid smoking to get the best benefit. What side effects may I notice from receiving this medication? Side effects that you should report to your care team as soon as possible: Allergic reactions--skin rash, itching, hives, swelling of the face, lips, tongue, or throat Swelling of the ankles, hands, or feet Trouble breathing Side effects that usually do not require medical attention (report to your care team if they continue or are bothersome): Diarrhea This list may not describe all possible side effects. Call your doctor for medical advice about side effects. You may report side effects to FDA at 1-800-FDA-1088. Where should I keep my medication? Keep out of the reach of children. Store at room temperature between 15 and 30 degrees C (59 and 85 degrees F). Protect from light. Throw away any unused medication after the expiration date. NOTE: This sheet is a summary. It may not cover all possible information. If you have questions about this medicine, talk to your doctor, pharmacist, or health care provider.  2024 Elsevier/Gold Standard (2020-11-17 00:00:00)

## 2023-04-25 ENCOUNTER — Encounter: Payer: Self-pay | Admitting: Internal Medicine

## 2023-04-25 ENCOUNTER — Ambulatory Visit: Admitting: Internal Medicine

## 2023-04-25 NOTE — Progress Notes (Deleted)
 Name: Amber [name]  MRN/ DOB: [id number], January 14, 1981    Age/ Sex: 43 y.o., female     PCP: Frann Mabel Mt, DO   Reason for Endocrinology Evaluation: Hyperprolactinemia      Initial Endocrinology Clinic Visit: 03/02/2016    PATIENT IDENTIFIER: Amber [name] is a 43 y.o., female with a past medical history of Prolactinoma, GERD, Asthma and migraine headaches . She has followed with [facility] since 03/02/2016 for consultative assistance with management of her hyperprolactinemia .   HISTORICAL SUMMARY: The patient was first diagnosed with hyperprolactinemia in 1996 during evaluation for primary amenorrhea ( highest reading 198 ). She is S/P resection of prolactinoma in 2009 [place], [place] due to size 3 cm, per pt she had a sphenoidal sinus penetration. Per pt prolactin levels normalized and menses resumed.  Pt was noted with elevated prolactin levels again 2015 and was started on Cabergoline , imaging have been reported at  no tumor     Cabergoline  was started 2019 Bromocriptine  was added to cabergoline  05/2019 by another endocrinologist  On her initial visit with me in 08/2019, we stopped the Bromocriptine  and increased cabergoline . Since the dose exceeded 2 mg/ week , we proceed with an echocardiogram , which showed normal valve structure but there was a question about a retro aortic coronary artery. CT angio was normal.     Repeat pituitary MRI in 08/2019 showed regression of pituitary gland     NO FH of pituitary disease    DM HISTORY : The patient was diagnosed with T2DM 11/2022 with an A1c of 11.6%.    SUBJECTIVE:    Today (01/06/2023):  Amber [name] is here for a follow up on hyperprolactinemia ,and pituitary adenoma.    Denies nipple discharge  Has noted vision changes, scheduled  for an eye exam 03/2023 She continues with migraine headaches , follows with Dr. Ena  Denies nausea, vomiting Denies constipation  or diarrhea    She is on Cabergoline  0.5 mg:   HISTORY:  Past Medical History:  Past Medical History:  Diagnosis Date   Anxiety    Asthma    Depression    Diabetes (HCC)    GERD (gastroesophageal reflux disease)    History of chicken pox    History of migraine headaches    Migraines    Pituitary adenoma (HCC)    Seizures (HCC)    Past Surgical History:  Past Surgical History:  Procedure Laterality Date   APPENDECTOMY     CHOLECYSTECTOMY     Pituiary Tumor     Tumor Removal Pituitary   Social History:  reports that she quit smoking about 7 years ago. Her smoking use included cigarettes. She started smoking about 22 years ago. She has a 15 pack-year smoking history. She has never used smokeless tobacco. She reports that she does not drink alcohol and does not use drugs. Family History:  Family History  Problem Relation Age of Onset   Diabetes Mother    Migraines Mother    Hyperlipidemia Mother    Bipolar disorder Mother    Irritable bowel syndrome Mother    Diabetes Father    Colon cancer Father        dx at age 39   Hyperlipidemia Father    Hypertension Father    Migraines Sister    Bipolar disorder Brother    Other Neg Hx        pituitary disorder   Esophageal cancer Neg Hx  HOME MEDICATIONS: Allergies as of 04/25/2023       Reactions   Bee Venom Anaphylaxis   Chlorhexidine Hives, Itching, Rash   Ibuprofen Other (See Comments)   GI bleed   Iodine Hives, Itching, Rash   Shellfish Allergy  Shortness Of Breath, Swelling, Other (See Comments)   Tingling in the lips, mouth and throat   Sulfa Antibiotics Anaphylaxis   Triptans Other (See Comments)   Makes pain worse. Pain in joints   Lunesta  [eszopiclone ] Nausea And Vomiting   GI issues   Aspirin Other (See Comments)   UNKNOWN REACTION. Her parents and siblings are allergic to Aspirin.   Nsaids Nausea And Vomiting   Tape Rash   Blisters        Medication List        Accurate as of April 25, 2023  6:44  AM. If you have any questions, ask your nurse or doctor.          Accu-Chek Guide test strip Generic drug: glucose blood 1 each by Other route daily in the afternoon. Use as instructed   acetaminophen  500 MG tablet Commonly known as: TYLENOL  Take 500 mg by mouth as needed.   cabergoline  0.5 MG tablet Commonly known as: DOSTINEX  2.5 mg weekly (Two tabs on Mondays, 1 tab on wednesdays and 2 tabs on Fridays)   Dexcom G7 Sensor Misc Use daily to check blood sugar,  DXE11.65   Emgality  120 MG/ML Soaj Generic drug: Galcanezumab -gnlm Inject 120 mg into the skin every 21 ( twenty-one) days.   EPINEPHrine  0.3 mg/0.3 mL Soaj injection Commonly known as: EpiPen  2-Pak Inject 0.3 mg into the muscle as needed for anaphylaxis.   escitalopram  10 MG tablet Commonly known as: LEXAPRO  TAKE ONE TABLET BY MOUTH EVERY DAY   famotidine  20 MG tablet Commonly known as: PEPCID  Take 20 mg by mouth 2 (two) times daily.   fenofibrate  145 MG tablet Commonly known as: TRICOR  Take 1 tablet (145 mg total) by mouth daily.   gabapentin 300 MG capsule Commonly known as: NEURONTIN Take 1 capsule by mouth at bedtime.   metformin  1000 MG (OSM) 24 hr tablet Commonly known as: FORTAMET  Take 1 tablet (1,000 mg total) by mouth 2 (two) times daily with a meal.   metoprolol  tartrate 25 MG tablet Commonly known as: LOPRESSOR  Take 1 tablet (25 mg total) by mouth 2 (two) times daily.   Nurtec 75 MG Tbdp Generic drug: Rimegepant Sulfate Take 1 tablet (75 mg total) by mouth as needed (Daily as needed for a Migraine). Maximum 1 tablet in 24 hours.  Quantity 8.   ondansetron  8 MG tablet Commonly known as: Zofran  Take 1 tablet (8 mg total) by mouth as needed for nausea or vomiting.   pantoprazole  40 MG tablet Commonly known as: PROTONIX  Take 1 tablet (40 mg total) by mouth 2 (two) times daily.   QUEtiapine  300 MG tablet Commonly known as: SEROQUEL  Take 1 tablet (300 mg total) by mouth at bedtime.    Riboflavin 400 MG Caps Take 400 mg by mouth daily.   rosuvastatin  20 MG tablet Commonly known as: Crestor  Take 1 tablet (20 mg total) by mouth daily.   tirzepatide  10 MG/0.5ML Pen Commonly known as: MOUNJARO  Inject 10 mg into the skin once a week for 28 days.   tirzepatide  12.5 MG/0.5ML Pen Commonly known as: MOUNJARO  Inject 12.5 mg into the skin once a week for 28 days.   tirzepatide  15 MG/0.5ML Pen Commonly known as: MOUNJARO  Inject 15 mg into  the skin once a week. Start taking on: May 18, 2023   topiramate  100 MG tablet Commonly known as: TOPAMAX  Take 1 tablet (100 mg total) by mouth at bedtime.   traMADol  50 MG tablet Commonly known as: ULTRAM  TAKE ONE TABLET BY MOUTH EVERY 6 HOURS AS NEEDED          OBJECTIVE:   PHYSICAL EXAM: VS: There were no vitals taken for this visit.   EXAM: General: Pt appears well and is in NAD  Neck: General: Supple without adenopathy. Thyroid : Thyroid  size normal.  No goiter or nodules appreciated.  Lungs: Clear with good BS bilat   Heart: Auscultation: RRR.  Mental Status: Judgment, insight: Intact Orientation: Oriented to time, place, and person Mood and affect: No depression, anxiety, or agitation      DATA REVIEWED:    Latest Reference Range & Units 01/06/23 10:27  Prolactin ng/mL 21.3  TSH 0.35 - 5.50 uIU/mL 0.87  T4,Free(Direct) 0.60 - 1.60 ng/dL 9.16    Latest Reference Range & Units 12/24/22 22:58  Sodium 135 - 145 mmol/L 134 (L)  Potassium 3.5 - 5.1 mmol/L 3.7  Chloride 98 - 111 mmol/L 101  CO2 22 - 32 mmol/L 22  Glucose 70 - 99 mg/dL 675 (H)  BUN 6 - 20 mg/dL 10  Creatinine 9.55 - 8.99 mg/dL 8.80 (H)  Calcium  8.9 - 10.3 mg/dL 8.9  Anion gap 5 - 15  11  GFR, Estimated >60 mL/min 59 (L)    Latest Reference Range & Units 01/06/23 10:27  TSH 0.35 - 5.50 uIU/mL 0.87  T4,Free(Direct) 0.60 - 1.60 ng/dL 9.16    MRI 3/87/7978  Dedicated pituitary imaging. Gland size appears slightly smaller now  compared to 2018 (series 16, image 9 today versus series 17, image 6 previously). No suprasellar mass or mass effect. Infundibulum and hypothalamus appear normal.   Regressed hypoenhancing area in the left inferior and lateral aspect of the gland. Today there is only subtle heterogeneous enhancement on that side (series 7, image 10) encompassing 3-4 mm. The cavernous sinuses remain normal. Normal clivus. No other abnormal enhancement.   IMPRESSION: 1. Regression of pituitary microadenoma since 2018, subtle residual heterogeneity in the left aspect of the gland encompassing 3-4 mm.   2. No new intracranial abnormality. Stable mild nonspecific cerebral white matter signal changes.   ASSESSMENT / PLAN / RECOMMENDATIONS:   Hx of Pituitary Macroadenoma , S/P Transphenoidal surgery in 2009:  - Per pt she had a transphenoidal surgery for a 3 cm pituitary adenoma in 2009 - Her last MRI was in 08/2019 indicating regression  - Labs have been normal to include TSH, ACTH , cortisol and IGF-1     Hyperprolactinemia :  - Diagnosed in 1996 - Normalization of levels following transphenoidal pituitary resection in 2009 , restarted on cabergoline  in 2015 and again in 2019.  - In March 2021, bromocriptine  was added to her regimen by previouse endocrinologist, this was discontinued in 08/2019 and increased cabergoline .  - Pt will need echocardiogram by next visit  -Repeat prolactin remains normal    Medications   Continue cabergoline  0.5 mg ,a total of  2.5 mg weekly (Two tabs on Mondays, 1 tab on wednesdays and 2 tabs on Fridays)     F/U in 6 months      Signed electronically by: Stefano Redgie Butts, MD  [facility] Endocrinology  [facility] [address] [place] [place], [place] [address] Phone: [phone] FAX: [phone]  CC: Frann Mabel Mt, DO [address] [place] [address] Phone: [phone]  Fax:  [phone]   Return to Endocrinology clinic as below: Future Appointments  Date Time Provider Department Center  04/25/2023 12:10 PM Ajdin Macke, Donell Cardinal, MD [facility] None  05/01/2023  3:00 PM [facility] INJ NURSE [facility] None  05/08/2023  3:00 PM [facility] INJ NURSE [facility] None  05/09/2023  1:30 PM Lorin Norris, MD [facility] None  05/15/2023  3:00 PM [facility] INJ NURSE [facility] None  05/22/2023  3:00 PM [facility] LAB [facility] None  05/22/2023  3:15 PM Tonette Lauraine HERO, PA-C [facility] None  05/22/2023  3:30 PM [facility] INJ NURSE [facility] None  05/24/2023  3:20 PM Madireddy, Alean SAUNDERS, MD [facility] None  05/30/2023  1:00 PM Cirigliano, Vito V, DO [facility]  06/12/2023  2:10 PM Skeet Juliene SAUNDERS, DO [facility] None

## 2023-04-27 ENCOUNTER — Encounter: Payer: Self-pay | Admitting: Family Medicine

## 2023-04-27 ENCOUNTER — Other Ambulatory Visit: Payer: Self-pay | Admitting: Family

## 2023-04-27 DIAGNOSIS — R002 Palpitations: Secondary | ICD-10-CM

## 2023-04-27 DIAGNOSIS — I479 Paroxysmal tachycardia, unspecified: Secondary | ICD-10-CM

## 2023-05-01 ENCOUNTER — Other Ambulatory Visit: Payer: Self-pay

## 2023-05-01 ENCOUNTER — Emergency Department (HOSPITAL_BASED_OUTPATIENT_CLINIC_OR_DEPARTMENT_OTHER)
Admission: EM | Admit: 2023-05-01 | Discharge: 2023-05-02 | Disposition: A | Discharge status: Home / Self Care | Attending: Emergency Medicine | Admitting: Emergency Medicine

## 2023-05-01 ENCOUNTER — Inpatient Hospital Stay

## 2023-05-01 ENCOUNTER — Encounter (HOSPITAL_BASED_OUTPATIENT_CLINIC_OR_DEPARTMENT_OTHER): Payer: Self-pay | Admitting: Emergency Medicine

## 2023-05-01 VITALS — BP 94/75 | HR 99 | Temp 98.2°F | Resp 18

## 2023-05-01 DIAGNOSIS — R519 Headache, unspecified: Secondary | ICD-10-CM | POA: Insufficient documentation

## 2023-05-01 DIAGNOSIS — J45909 Unspecified asthma, uncomplicated: Secondary | ICD-10-CM | POA: Insufficient documentation

## 2023-05-01 DIAGNOSIS — E119 Type 2 diabetes mellitus without complications: Secondary | ICD-10-CM | POA: Diagnosis not present

## 2023-05-01 DIAGNOSIS — D509 Iron deficiency anemia, unspecified: Secondary | ICD-10-CM

## 2023-05-01 DIAGNOSIS — G43909 Migraine, unspecified, not intractable, without status migrainosus: Secondary | ICD-10-CM

## 2023-05-01 MED ORDER — METOCLOPRAMIDE HCL 5 MG/ML IJ SOLN
10.0000 mg | Freq: Once | INTRAMUSCULAR | Status: AC
  Administered 2023-05-02: 10 mg via INTRAVENOUS
  Filled 2023-05-01: qty 2

## 2023-05-01 MED ORDER — DEXAMETHASONE SODIUM PHOSPHATE 10 MG/ML IJ SOLN
10.0000 mg | Freq: Once | INTRAMUSCULAR | Status: AC
  Administered 2023-05-02: 10 mg via INTRAVENOUS
  Filled 2023-05-01: qty 1

## 2023-05-01 MED ORDER — HYDROMORPHONE HCL 1 MG/ML IJ SOLN
1.0000 mg | Freq: Once | INTRAMUSCULAR | Status: AC
  Administered 2023-05-02: 1 mg via INTRAVENOUS
  Filled 2023-05-01: qty 1

## 2023-05-01 MED ORDER — CYANOCOBALAMIN 1000 MCG/ML IJ SOLN
1000.0000 ug | Freq: Once | INTRAMUSCULAR | Status: AC
  Administered 2023-05-01: 1000 ug via INTRAMUSCULAR
  Filled 2023-05-01: qty 1

## 2023-05-01 MED ORDER — DIPHENHYDRAMINE HCL 50 MG/ML IJ SOLN
25.0000 mg | Freq: Once | INTRAMUSCULAR | Status: AC
  Administered 2023-05-02: 25 mg via INTRAVENOUS
  Filled 2023-05-01: qty 1

## 2023-05-01 MED ORDER — SODIUM CHLORIDE 0.9 % IV BOLUS
1000.0000 mL | Freq: Once | INTRAVENOUS | Status: AC
  Administered 2023-05-02: 1000 mL via INTRAVENOUS

## 2023-05-01 NOTE — Patient Instructions (Signed)
 Vitamin B12 Deficiency Vitamin B12 deficiency means that your body does not have enough vitamin B12. The body needs this important vitamin: To make red blood cells. To make genes (DNA). To help the nerves work. If you do not have enough vitamin B12 in your body, you can have health problems, such as not having enough red blood cells in the blood (anemia). What are the causes? Not eating enough foods that contain vitamin B12. Not being able to take in (absorb) vitamin B12 from the food that you eat. Certain diseases. A condition in which the body does not make enough of a certain protein. This results in your body not taking in enough vitamin B12. Having a surgery in which part of the stomach or small intestine is taken out. Taking medicines that make it hard for the body to take in vitamin B12. These include: Heartburn medicines. Some medicines that are used to treat diabetes. What increases the risk? Being an older adult. Eating a vegetarian or vegan diet that does not include any foods that come from animals. Not eating enough foods that contain vitamin B12 while you are pregnant. Taking certain medicines. Having alcoholism. What are the signs or symptoms? In some cases, there are no symptoms. If the condition leads to too few blood cells or nerve damage, symptoms can occur, such as: Feeling weak or tired. Not being hungry. Losing feeling (numbness) or tingling in your hands and feet. Redness and burning of the tongue. Feeling sad (depressed). Confusion or memory problems. Trouble walking. If anemia is very bad, symptoms can include: Being short of breath. Being dizzy. Having a very fast heartbeat. How is this treated? Changing the way you eat and drink, such as: Eating more foods that contain vitamin B12. Drinking little or no alcohol. Getting vitamin B12 shots. Taking vitamin B12 supplements by mouth (orally). Your doctor will tell you the dose that is best for you. Follow  these instructions at home: Eating and drinking  Eat foods that come from animals and have a lot of vitamin B12 in them. These include: Meats and poultry. This includes beef, pork, chicken, [place], and organ meats, such as liver. Seafood, such as clams, rainbow trout, salmon, tuna, and haddock. Eggs. Dairy foods such as milk, yogurt, and cheese. Eat breakfast cereals that have vitamin B12 added to them (are fortified). Check the label. The items listed above may not be a complete list of foods and beverages you can eat and drink. Contact a dietitian for more information. Alcohol use Do not drink alcohol if: Your doctor tells you not to drink. You are pregnant, may be pregnant, or are planning to become pregnant. If you drink alcohol: Limit how much you have to: 0-1 drink a day for women. 0-2 drinks a day for men. Know how much alcohol is in your drink. In the [place] one drink equals one 12 oz bottle of beer (355 mL), one 5 oz glass of wine (148 mL), or one 1 oz glass of hard liquor (44 mL). General instructions Get any vitamin B12 shots if told by your doctor. Take supplements only as told by your doctor. Follow the directions. Keep all follow-up visits. Contact a doctor if: Your symptoms come back. Your symptoms get worse or do not get better with treatment. Get help right away if: You have trouble breathing. You have a very fast heartbeat. You have chest pain. You get dizzy. You faint. These symptoms may be an emergency. Get help right away. Call 911.  Do not wait to see if the symptoms will go away. Do not drive yourself to the hospital. Summary Vitamin B12 deficiency means that your body is not getting enough of the vitamin. In some cases, there are no symptoms of this condition. Treatment may include making a change in the way you eat and drink, getting shots, or taking supplements. Eat foods that have vitamin B12 in them. This information is not intended to replace advice  given to you by your health care provider. Make sure you discuss any questions you have with your health care provider. Document Revised: 10/30/2020 Document Reviewed: 10/30/2020 Elsevier Patient Education  2024 [employer].

## 2023-05-01 NOTE — ED Provider Notes (Signed)
[place] [facility] Provider Note   CSN: [id number] Arrival date & time: 05/01/23  2011     History  Chief Complaint  Patient presents with   Headache    Amber [name] is a 43 y.o. female.  This patient is a 43 year old female with past medical history of migraines, asthma, GERD, anxiety, seizures, type 2 diabetes.  Patient presenting today for evaluation of headache.  She reports generalized headache along with nausea and multiple episodes of vomiting.  This all began approximately 4 AM.  She denies any visual disturbances.  No weakness or numbness.  She has been taking her home medications including Phenergan and Zofran with no relief.  The history is provided by the patient.       Home Medications Prior to Admission medications   Medication Sig Start Date End Date Taking? Authorizing Provider  acetaminophen (TYLENOL) 500 MG tablet Take 500 mg by mouth as needed.    [provider]  cabergoline (DOSTINEX) 0.5 MG tablet 2.5 mg weekly (Two tabs on Mondays, 1 tab on wednesdays and 2 tabs on Fridays) 01/09/23   Shamleffer, Konrad Dolores, MD  Continuous Glucose Sensor (DEXCOM G7 SENSOR) MISC Use daily to check blood sugar,  DXE11.65 01/30/23   Wendling, Jilda Roche, DO  EPINEPHrine (EPIPEN 2-PAK) 0.3 mg/0.3 mL IJ SOAJ injection Inject 0.3 mg into the muscle as needed for anaphylaxis. 03/17/23   Sharlene Dory, DO  escitalopram (LEXAPRO) 10 MG tablet TAKE ONE TABLET BY MOUTH EVERY DAY 09/05/22   Sharlene Dory, DO  famotidine (PEPCID) 20 MG tablet Take 20 mg by mouth 2 (two) times daily.    [provider]  fenofibrate (TRICOR) 145 MG tablet Take 1 tablet (145 mg total) by mouth daily. 09/01/22   Sharlene Dory, DO  gabapentin (NEURONTIN) 300 MG capsule Take 1 capsule by mouth at bedtime. 01/25/23   [provider]  Galcanezumab-gnlm (EMGALITY) 120 MG/ML SOAJ Inject 120 mg into the skin  every 21 ( twenty-one) days. 01/06/23   Everlena Cooper, Adam R, DO  glucose blood (ACCU-CHEK GUIDE) test strip 1 each by Other route daily in the afternoon. Use as instructed 01/06/23   Shamleffer, Konrad Dolores, MD  metformin (FORTAMET) 1000 MG (OSM) 24 hr tablet Take 1 tablet (1,000 mg total) by mouth 2 (two) times daily with a meal. 01/06/23   Shamleffer, Konrad Dolores, MD  metoprolol tartrate (LOPRESSOR) 25 MG tablet Take 1 tablet (25 mg total) by mouth 2 (two) times daily. 03/13/23 06/11/23  Madireddy, Marlyn Corporal, MD  ondansetron (ZOFRAN) 8 MG tablet Take 1 tablet (8 mg total) by mouth as needed for nausea or vomiting. 01/27/23   Everlena Cooper, Adam R, DO  pantoprazole (PROTONIX) 40 MG tablet Take 1 tablet (40 mg total) by mouth 2 (two) times daily. 09/01/22   Sharlene Dory, DO  QUEtiapine (SEROQUEL) 300 MG tablet Take 1 tablet (300 mg total) by mouth at bedtime. 09/01/22   Sharlene Dory, DO  Riboflavin 400 MG CAPS Take 400 mg by mouth daily.    [provider]  Rimegepant Sulfate (NURTEC) 75 MG TBDP Take 1 tablet (75 mg total) by mouth as needed (Daily as needed for a Migraine). Maximum 1 tablet in 24 hours.  Quantity 8. 06/22/22   Everlena Cooper, Adam R, DO  rosuvastatin (CRESTOR) 20 MG tablet Take 1 tablet (20 mg total) by mouth daily. 09/01/22   Sharlene Dory, DO  tirzepatide [facility] 10 MG/0.5ML Pen Inject 10  mg into the skin once a week for 28 days. 04/03/23 05/01/23  Sharlene Dory, DO  tirzepatide [facility]) 12.5 MG/0.5ML Pen Inject 12.5 mg into the skin once a week for 28 days. Patient not taking: Reported on 04/20/2023 04/20/23 05/18/23  Sharlene Dory, DO  tirzepatide [facility]) 15 MG/0.5ML Pen Inject 15 mg into the skin once a week. Patient not taking: Reported on 04/20/2023 05/18/23   Sharlene Dory, DO  topiramate (TOPAMAX) 100 MG tablet Take 1 tablet (100 mg total) by mouth at bedtime. 01/06/23   Drema Dallas, DO  traMADol (ULTRAM) 50 MG tablet  TAKE ONE TABLET BY MOUTH EVERY 6 HOURS AS NEEDED 03/29/23   Antony Madura, MD      Allergies    Bee venom, Chlorhexidine, Ibuprofen, Iodine, Shellfish allergy, Sulfa antibiotics, Triptans, Lunesta [eszopiclone], Aspirin, Nsaids, and Tape    Review of Systems   Review of Systems  All other systems reviewed and are negative.   Physical Exam Updated Vital Signs BP 108/74 (BP Location: Right Arm)   Pulse 94   Temp 98.5 F (36.9 C)   Resp 20   Ht 5\' 6"  (1.676 m)   Wt 110.7 kg   LMP 04/21/2023 (Exact Date)   SpO2 99%   BMI 39.38 kg/m  Physical Exam Vitals and nursing note reviewed.  Constitutional:      General: She is not in acute distress.    Appearance: She is well-developed. She is not diaphoretic.  HENT:     Head: Normocephalic and atraumatic.  Eyes:     General: No visual field deficit.    Extraocular Movements: Extraocular movements intact.     Pupils: Pupils are equal, round, and reactive to light.  Cardiovascular:     Rate and Rhythm: Normal rate and regular rhythm.     Heart sounds: No murmur heard.    No friction rub. No gallop.  Pulmonary:     Effort: Pulmonary effort is normal. No respiratory distress.     Breath sounds: Normal breath sounds. No wheezing.  Abdominal:     General: Bowel sounds are normal. There is no distension.     Palpations: Abdomen is soft.     Tenderness: There is no abdominal tenderness.  Musculoskeletal:        General: Normal range of motion.     Cervical back: Normal range of motion and neck supple.  Skin:    General: Skin is warm and dry.  Neurological:     General: No focal deficit present.     Mental Status: She is alert and oriented to person, place, and time.     Cranial Nerves: No cranial nerve deficit or facial asymmetry.     ED Results / Procedures / Treatments   Labs (all labs ordered are listed, but only abnormal results are displayed) Labs Reviewed - No data to display  EKG None  Radiology No results  found.  Procedures Procedures    Medications Ordered in ED Medications  sodium chloride 0.9 % bolus 1,000 mL (has no administration in time range)  HYDROmorphone (DILAUDID) injection 1 mg (has no administration in time range)  dexamethasone (DECADRON) injection 10 mg (has no administration in time range)  diphenhydrAMINE (BENADRYL) injection 25 mg (has no administration in time range)  metoCLOPramide (REGLAN) injection 10 mg (has no administration in time range)    ED Course/ Medical Decision Making/ A&P  Patient is a 43 year old female presenting for evaluation of headache.  She has  history of migraines and this feels the same.  Patient arrives with stable vital signs and is afebrile.  Neurologic exam is nonfocal.  Migraine cocktail has been administered consisting of Decadron, Reglan, Benadryl, and Dilaudid.  She is now feeling considerably improved and feels as though she can be discharged.  I see no red flags that would indicate a more emergent situation and do not feel as though any imaging or LP is indicated.  Final Clinical Impression(s) / ED Diagnoses Final diagnoses:  None    Rx / DC Orders ED Discharge Orders     None         Geoffery Lyons, MD 05/02/23 [phone]

## 2023-05-01 NOTE — ED Triage Notes (Signed)
 Pt POV steady gait- pt c/o migraine with emesis (x6) since 0400 today. Reports hx of migraines, this feels similar.  Denies fever.

## 2023-05-02 MED ORDER — SUTAB 1479-225-188 MG PO TABS: ORAL_TABLET | ORAL | 0 refills | Status: DC

## 2023-05-02 NOTE — Discharge Instructions (Signed)
Continue home medications as previously prescribed.  Follow-up with your primary doctor/neurologist if symptoms persist.

## 2023-05-02 NOTE — Addendum Note (Signed)
Addended by: Lamona Curl on: 05/02/2023 09:42 AM   Modules accepted: Orders

## 2023-05-04 ENCOUNTER — Telehealth: Payer: Self-pay

## 2023-05-04 NOTE — Telephone Encounter (Signed)
Patient wants a provider switch from Dr. Vincent Gros in [facility] to Dr. Odis Hollingshead at [facility].

## 2023-05-04 NOTE — Telephone Encounter (Signed)
That's fine.   Bari Handshoe [place], DO, [facility]

## 2023-05-05 ENCOUNTER — Emergency Department (HOSPITAL_BASED_OUTPATIENT_CLINIC_OR_DEPARTMENT_OTHER)
Admission: EM | Admit: 2023-05-05 | Discharge: 2023-05-06 | Disposition: A | Discharge status: Home / Self Care | Attending: Emergency Medicine | Admitting: Emergency Medicine

## 2023-05-05 ENCOUNTER — Encounter (HOSPITAL_BASED_OUTPATIENT_CLINIC_OR_DEPARTMENT_OTHER): Payer: Self-pay

## 2023-05-05 ENCOUNTER — Other Ambulatory Visit: Payer: Self-pay

## 2023-05-05 DIAGNOSIS — Z91010 Allergy to peanuts: Secondary | ICD-10-CM | POA: Insufficient documentation

## 2023-05-05 DIAGNOSIS — R21 Rash and other nonspecific skin eruption: Secondary | ICD-10-CM | POA: Diagnosis present

## 2023-05-05 DIAGNOSIS — T782XXA Anaphylactic shock, unspecified, initial encounter: Secondary | ICD-10-CM | POA: Diagnosis not present

## 2023-05-05 LAB — CBC WITH DIFFERENTIAL/PLATELET
Abs Immature Granulocytes: 0.07 10*3/uL (ref 0.00–0.07)
Basophils Absolute: 0 10*3/uL (ref 0.0–0.1)
Basophils Relative: 0 %
Eosinophils Absolute: 0.1 10*3/uL (ref 0.0–0.5)
Eosinophils Relative: 0 %
HCT: 34.6 % — ABNORMAL LOW (ref 36.0–46.0)
Hemoglobin: 11.7 g/dL — ABNORMAL LOW (ref 12.0–15.0)
Immature Granulocytes: 1 %
Lymphocytes Relative: 12 %
Lymphs Abs: 1.6 10*3/uL (ref 0.7–4.0)
MCH: 27.8 pg (ref 26.0–34.0)
MCHC: 33.8 g/dL (ref 30.0–36.0)
MCV: 82.2 fL (ref 80.0–100.0)
Monocytes Absolute: 0.4 10*3/uL (ref 0.1–1.0)
Monocytes Relative: 3 %
Neutro Abs: 11.7 10*3/uL — ABNORMAL HIGH (ref 1.7–7.7)
Neutrophils Relative %: 84 %
Platelets: 403 10*3/uL — ABNORMAL HIGH (ref 150–400)
RBC: 4.21 MIL/uL (ref 3.87–5.11)
RDW: 16.8 % — ABNORMAL HIGH (ref 11.5–15.5)
WBC: 13.8 10*3/uL — ABNORMAL HIGH (ref 4.0–10.5)
nRBC: 0 % (ref 0.0–0.2)

## 2023-05-05 LAB — BASIC METABOLIC PANEL
Anion gap: 10 (ref 5–15)
BUN: 11 mg/dL (ref 6–20)
CO2: 21 mmol/L — ABNORMAL LOW (ref 22–32)
Calcium: 8.8 mg/dL — ABNORMAL LOW (ref 8.9–10.3)
Chloride: 107 mmol/L (ref 98–111)
Creatinine, Ser: 0.98 mg/dL (ref 0.44–1.00)
GFR, Estimated: >60 mL/min (ref >60)
Glucose, Bld: 144 mg/dL — ABNORMAL HIGH (ref 70–99)
Potassium: 3.1 mmol/L — ABNORMAL LOW (ref 3.5–5.1)
Sodium: 138 mmol/L (ref 135–145)

## 2023-05-05 LAB — TROPONIN I (HIGH SENSITIVITY): Troponin I (High Sensitivity): <2 ng/L (ref <18)

## 2023-05-05 MED ORDER — ONDANSETRON HCL 4 MG/2ML IJ SOLN
4.0000 mg | Freq: Once | INTRAMUSCULAR | Status: AC
  Administered 2023-05-05: 4 mg via INTRAVENOUS
  Filled 2023-05-05: qty 2

## 2023-05-05 MED ORDER — DIPHENHYDRAMINE HCL 25 MG PO CAPS
25.0000 mg | ORAL_CAPSULE | ORAL | Status: DC
Start: 2023-05-05 — End: 2023-05-05

## 2023-05-05 MED ORDER — ONDANSETRON 4 MG PO TBDP: 4.0000 mg | ORAL_TABLET | ORAL | Status: DC

## 2023-05-05 MED ORDER — PREDNISONE 20 MG PO TABS: ORAL_TABLET | ORAL | 0 refills | Status: DC

## 2023-05-05 MED ORDER — EPINEPHRINE 0.3 MG/0.3ML IJ SOAJ
0.3000 mg | Freq: Once | INTRAMUSCULAR | Status: AC
  Administered 2023-05-05: 0.3 mg via INTRAMUSCULAR
  Filled 2023-05-05: qty 0.3

## 2023-05-05 MED ORDER — FAMOTIDINE IN NACL 20-0.9 MG/50ML-% IV SOLN
20.0000 mg | Freq: Once | INTRAVENOUS | Status: AC
  Administered 2023-05-05: 20 mg via INTRAVENOUS
  Filled 2023-05-05: qty 50

## 2023-05-05 MED ORDER — POTASSIUM CHLORIDE CRYS ER 20 MEQ PO TBCR
40.0000 meq | EXTENDED_RELEASE_TABLET | Freq: Once | ORAL | Status: AC
  Administered 2023-05-06: 40 meq via ORAL
  Filled 2023-05-05: qty 2

## 2023-05-05 MED ORDER — DIPHENHYDRAMINE HCL 50 MG/ML IJ SOLN
25.0000 mg | Freq: Once | INTRAMUSCULAR | Status: AC
  Administered 2023-05-05: 25 mg via INTRAVENOUS
  Filled 2023-05-05: qty 1

## 2023-05-05 MED ORDER — LACTATED RINGERS IV BOLUS
1000.0000 mL | Freq: Once | INTRAVENOUS | Status: AC
  Administered 2023-05-05: 1000 mL via INTRAVENOUS

## 2023-05-05 MED ORDER — METHYLPREDNISOLONE SODIUM SUCC 125 MG IJ SOLR
125.0000 mg | Freq: Once | INTRAMUSCULAR | Status: AC
  Administered 2023-05-05: 125 mg via INTRAVENOUS
  Filled 2023-05-05: qty 2

## 2023-05-05 NOTE — Discharge Instructions (Signed)
Follow-up with your allergist as scheduled.  Return to the emergency room if you have any worsening symptoms.

## 2023-05-05 NOTE — ED Provider Notes (Addendum)
[place] [facility] Provider Note   CSN: [id number] Arrival date & time: 05/05/23  2030     History  Chief Complaint  Patient presents with   Allergic Reaction    Clora Ohmer is a 43 y.o. female.  Patient is a 43 year old female who presents with allergic reaction.  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  She has an upcoming appointment on Tuesday with an allergist.  She said that she has been doing fine when she cooks her food at home.  She went out to eat at a [employer] today and thought she was eating food that was safe but feels like it may have had either soy or sesame in it.  Shortly after she finished eating she noticed a rash and itching that developed on her arms and her back.  She started vomiting and has not been able to keep anything down.  She feels a little bit of swelling in the back of her throat and feels a little bit of swelling of her lower lip.  She denies any shortness of breath.       Home Medications Prior to Admission medications   Medication Sig Start Date End Date Taking? Authorizing Provider  acetaminophen (TYLENOL) 500 MG tablet Take 500 mg by mouth as needed.    [provider]  cabergoline (DOSTINEX) 0.5 MG tablet 2.5 mg weekly (Two tabs on Mondays, 1 tab on wednesdays and 2 tabs on Fridays) 01/09/23   Shamleffer, Konrad Dolores, MD  Continuous Glucose Sensor (DEXCOM G7 SENSOR) MISC Use daily to check blood sugar,  DXE11.65 01/30/23   Wendling, Jilda Roche, DO  EPINEPHrine (EPIPEN 2-PAK) 0.3 mg/0.3 mL IJ SOAJ injection Inject 0.3 mg into the muscle as needed for anaphylaxis. 03/17/23   Sharlene Dory, DO  escitalopram (LEXAPRO) 10 MG tablet TAKE ONE TABLET BY MOUTH EVERY DAY 09/05/22   Sharlene Dory, DO  famotidine (PEPCID) 20 MG tablet Take 20 mg by mouth 2 (two) times daily.    [provider]  fenofibrate (TRICOR) 145 MG  tablet Take 1 tablet (145 mg total) by mouth daily. 09/01/22   Sharlene Dory, DO  gabapentin (NEURONTIN) 300 MG capsule Take 1 capsule by mouth at bedtime. 01/25/23   [provider]  Galcanezumab-gnlm (EMGALITY) 120 MG/ML SOAJ Inject 120 mg into the skin every 21 ( twenty-one) days. 01/06/23   Everlena Cooper, Adam R, DO  glucose blood (ACCU-CHEK GUIDE) test strip 1 each by Other route daily in the afternoon. Use as instructed 01/06/23   Shamleffer, Konrad Dolores, MD  metformin (FORTAMET) 1000 MG (OSM) 24 hr tablet Take 1 tablet (1,000 mg total) by mouth 2 (two) times daily with a meal. 01/06/23   Shamleffer, Konrad Dolores, MD  metoprolol tartrate (LOPRESSOR) 25 MG tablet Take 1 tablet (25 mg total) by mouth 2 (two) times daily. 03/13/23 06/11/23  Madireddy, Marlyn Corporal, MD  ondansetron (ZOFRAN) 8 MG tablet Take 1 tablet (8 mg total) by mouth as needed for nausea or vomiting. 01/27/23   Everlena Cooper, Adam R, DO  pantoprazole (PROTONIX) 40 MG tablet Take 1 tablet (40 mg total) by mouth 2 (two) times daily. 09/01/22   Sharlene Dory, DO  QUEtiapine (SEROQUEL) 300 MG tablet Take 1 tablet (300 mg total) by mouth at bedtime. 09/01/22   Sharlene Dory, DO  Riboflavin 400 MG CAPS Take 400 mg by mouth daily.    [provider]  Rimegepant Sulfate (NURTEC) 75 MG TBDP Take 1 tablet (75 mg total) by mouth as needed (Daily as needed for a Migraine). Maximum 1 tablet in 24 hours.  Quantity 8. 06/22/22   Everlena Cooper, Adam R, DO  rosuvastatin (CRESTOR) 20 MG tablet Take 1 tablet (20 mg total) by mouth daily. 09/01/22   Sharlene Dory, DO  Sodium Sulfate-Mag Sulfate-KCl (SUTAB) 902 742 1830 MG TABS Use as directed for colonoscopy. MANUFACTURER CODES!! BIN: [id number] PCN: CN GROUP: [id number] MEMBER ID: [id number];ONG AS SECONDARY INSURANCE ;NO PRIOR AUTHORIZATION 05/02/23   Cirigliano, Vito V, DO  tirzepatide (MOUNJARO) 12.5 MG/0.5ML Pen Inject 12.5 mg into the skin once a week for 28  days. Patient not taking: Reported on 04/20/2023 04/20/23 05/18/23  Sharlene Dory, DO  tirzepatide [facility]) 15 MG/0.5ML Pen Inject 15 mg into the skin once a week. Patient not taking: Reported on 04/20/2023 05/18/23   Sharlene Dory, DO  topiramate (TOPAMAX) 100 MG tablet Take 1 tablet (100 mg total) by mouth at bedtime. 01/06/23   Drema Dallas, DO  traMADol (ULTRAM) 50 MG tablet TAKE ONE TABLET BY MOUTH EVERY 6 HOURS AS NEEDED 03/29/23   Antony Madura, MD      Allergies    Bee venom, Chlorhexidine, Ibuprofen, Iodine, Peanut-containing drug products, Shellfish allergy, Sulfa antibiotics, Triptans, Lunesta [eszopiclone], Sesame seed (diagnostic), Soybean-containing drug products, Almond (diagnostic), Cashew nut (anacardium occidentale) skin test, Hazelnut (filbert), Walnut, Wheat, Aspirin, Nsaids, and Tape    Review of Systems   Review of Systems  Constitutional:  Negative for chills, diaphoresis, fatigue and fever.  HENT:  Positive for sore throat. Negative for congestion, rhinorrhea, sneezing, trouble swallowing and voice change.   Eyes: Negative.   Respiratory:  Negative for cough, chest tightness and shortness of breath.   Cardiovascular:  Negative for chest pain and leg swelling.  Gastrointestinal:  Positive for nausea and vomiting. Negative for abdominal pain, blood in stool and diarrhea.  Genitourinary:  Negative for difficulty urinating, flank pain, frequency and hematuria.  Musculoskeletal:  Negative for arthralgias and back pain.  Skin:  Positive for rash.  Neurological:  Negative for dizziness, speech difficulty, weakness, numbness and headaches.    Physical Exam Updated Vital Signs BP (!) 101/54   Pulse 92   Temp 99.2 F (37.3 C) (Oral)   Resp 11   Ht 5\' 6"  (1.676 m)   Wt 110.7 kg   LMP 04/21/2023 (Exact Date)   SpO2 99%   BMI 39.38 kg/m  Physical Exam Constitutional:      Appearance: She is well-developed.  HENT:     Head: Normocephalic and  atraumatic.     Mouth/Throat:     Comments: No visible angioedema Eyes:     Pupils: Pupils are equal, round, and reactive to light.  Cardiovascular:     Rate and Rhythm: Normal rate and regular rhythm.     Heart sounds: Normal heart sounds.  Pulmonary:     Effort: Pulmonary effort is normal. No respiratory distress.     Breath sounds: Normal breath sounds. No wheezing or rales.  Chest:     Chest wall: No tenderness.  Abdominal:     General: Bowel sounds are normal.     Palpations: Abdomen is soft.     Tenderness: There is no abdominal tenderness. There is no guarding or rebound.  Musculoskeletal:        General: Normal range of motion.     Cervical back: Normal range of motion and neck supple.  Lymphadenopathy:  Cervical: No cervical adenopathy.  Skin:    General: Skin is warm and dry.     Findings: Rash (Urticarial type rash on the arms and lower back) present.  Neurological:     Mental Status: She is alert and oriented to person, place, and time.     ED Results / Procedures / Treatments   Labs (all labs ordered are listed, but only abnormal results are displayed) Labs Reviewed - No data to display  EKG None  Radiology No results found.  Procedures Procedures    Medications Ordered in ED Medications  EPINEPHrine (EPI-PEN) injection 0.3 mg (0.3 mg Intramuscular Given 05/05/23 2124)  methylPREDNISolone sodium succinate (SOLU-MEDROL) 125 mg/2 mL injection 125 mg (125 mg Intravenous Given 05/05/23 2122)  famotidine (PEPCID) IVPB 20 mg premix (0 mg Intravenous Stopped 05/05/23 2155)  diphenhydrAMINE (BENADRYL) injection 25 mg (25 mg Intravenous Given 05/05/23 2123)  ondansetron (ZOFRAN) injection 4 mg (4 mg Intravenous Given 05/05/23 2120)  lactated ringers bolus 1,000 mL (1,000 mLs Intravenous New Bag/Given 05/05/23 2156)    ED Course/ Medical Decision Making/ A&P                                 Medical Decision Making Risk Prescription drug  management.   Patient is a 43 year old female who presents with anaphylactic reaction.  On arrival, she was given epinephrine injection, Solu-Medrol, Benadryl and Pepcid.  On reexam, she was feeling jittery but overall better.  However her blood pressure did drop briefly into the 80s.  She was given a bolus of IV fluids and it improved.  She does have an underlying history of POTS and says her blood pressure always runs a little bit low but this was lower than her baseline.  She overall is improving.  Will need a 4-hour observation.  Anticipate that if she has ongoing improvement, she can be discharged after the 4-hour observation.  She already has an EpiPen and I have instructed her on indications for use.  She has an upcoming appointment with an allergist.  Care was turned over to Dr. Manus Gunning pending 4-hour observation.  She did have a little bit of chest heaviness after the epi.  EKG doesn't show any ischemic changes.  CRITICAL CARE Performed by: Rolan Bucco Total critical care time: 60 minutes Critical care time was exclusive of separately billable procedures and treating other patients. Critical care was necessary to treat or prevent imminent or life-threatening deterioration. Critical care was time spent personally by me on the following activities: development of treatment plan with patient and/or surrogate as well as nursing, discussions with consultants, evaluation of patient's response to treatment, examination of patient, obtaining history from patient or surrogate, ordering and performing treatments and interventions, ordering and review of laboratory studies, ordering and review of radiographic studies, pulse oximetry and re-evaluation of patient's condition.   Final Clinical Impression(s) / ED Diagnoses Final diagnoses:  Anaphylaxis, initial encounter    Rx / DC Orders ED Discharge Orders     None         Rolan Bucco, MD 05/05/23 1610    Rolan Bucco,  MD 05/05/23 2247

## 2023-05-05 NOTE — ED Triage Notes (Signed)
Patient arrives POV c/o allergic reaction after going out to eat at [employer]. Patient states she recently found out she had multiple food allergies including soy and sesame seeds. Patient states she "thinks it may have been sesame oil" that caused the rxn. Patient reports itching to bilateral arm, axilla, lower legs, throat, and lips. Patient states she has never had an anaphylactic rxn to any foods. Patient states she has an epipen on her at all times; states she did not need to use it tonight. Patient a&o x4, NAD.

## 2023-05-05 NOTE — ED Notes (Signed)
Pt. Is here due to Asian food poss. Allergic reaction.Marland KitchenMarland Kitchen

## 2023-05-05 NOTE — ED Provider Notes (Signed)
Care assumed from Dr. Fredderick Phenix.  Patient with suspected anaphylaxis to food.  Did receive EpiPen and Solu-Medrol about 9:15 PM.  She is improving.  Still complains of chest heaviness after receiving epi.  EKG without acute ischemic changes.  Plan observation till 1:30 AM.  1:30 AM, patient feels back to baseline.  No distress.  No tongue swelling or lip swelling.  No chest pain or shortness of breath.  States she has several epinephrine pens at home.  Will discharge with prednisone per plan of previous team.  Return precautions discussed.   Glynn Octave, MD 05/06/23 [phone]

## 2023-05-05 NOTE — ED Notes (Signed)
No noted swelling in the face or mouth or lips.

## 2023-05-08 ENCOUNTER — Inpatient Hospital Stay

## 2023-05-08 VITALS — BP 106/78 | HR 100 | Temp 98.2°F | Resp 20

## 2023-05-08 DIAGNOSIS — D509 Iron deficiency anemia, unspecified: Secondary | ICD-10-CM | POA: Diagnosis not present

## 2023-05-08 MED ORDER — SODIUM CHLORIDE 0.9 % IV SOLN
INTRAVENOUS | Status: DC
Start: 2023-05-08 — End: 2023-05-08

## 2023-05-08 MED ORDER — CYANOCOBALAMIN 1000 MCG/ML IJ SOLN
1000.0000 ug | Freq: Once | INTRAMUSCULAR | Status: AC
  Administered 2023-05-08: 1000 ug via INTRAMUSCULAR
  Filled 2023-05-08: qty 1

## 2023-05-08 NOTE — Patient Instructions (Signed)
 Vitamin B12 Injection What is this medication? Vitamin B12 (VAHY tuh min B12) prevents and treats low vitamin B12 levels in your body. It is used in people who do not get enough vitamin B12 from their diet or when their digestive tract does not absorb enough. Vitamin B12 plays an important role in maintaining the health of your nervous system and red blood cells. This medicine may be used for other purposes; ask your health care provider or pharmacist if you have questions. COMMON BRAND NAME(S): B-12 Compliance Kit, B-12 Injection Kit, Cyomin, Dodex, LA-12, Nutri-Twelve, Physicians EZ Use B-12, Primabalt, Vitamin Deficiency Injectable System - B12 What should I tell my care team before I take this medication? They need to know if you have any of these conditions: Kidney disease Leber's disease Megaloblastic anemia An unusual or allergic reaction to cyanocobalamin, cobalt, other medications, foods, dyes, or preservatives Pregnant or trying to get pregnant Breast-feeding How should I use this medication? This medication is injected into a muscle or deeply under the skin. It is usually given in a clinic or care team's office. However, your care team may teach you how to inject yourself. Follow all instructions. Talk to your care team about the use of this medication in children. Special care may be needed. Overdosage: If you think you have taken too much of this medicine contact a poison control center or emergency room at once. NOTE: This medicine is only for you. Do not share this medicine with others. What if I miss a dose? If you are given your dose at a clinic or care team's office, call to reschedule your appointment. If you give your own injections, and you miss a dose, take it as soon as you can. If it is almost time for your next dose, take only that dose. Do not take double or extra doses. What may interact with this medication? Alcohol Colchicine This list may not describe all possible  interactions. Give your health care provider a list of all the medicines, herbs, non-prescription drugs, or dietary supplements you use. Also tell them if you smoke, drink alcohol, or use illegal drugs. Some items may interact with your medicine. What should I watch for while using this medication? Visit your care team regularly. You may need blood work done while you are taking this medication. You may need to follow a special diet. Talk to your care team. Limit your alcohol intake and avoid smoking to get the best benefit. What side effects may I notice from receiving this medication? Side effects that you should report to your care team as soon as possible: Allergic reactions--skin rash, itching, hives, swelling of the face, lips, tongue, or throat Swelling of the ankles, hands, or feet Trouble breathing Side effects that usually do not require medical attention (report to your care team if they continue or are bothersome): Diarrhea This list may not describe all possible side effects. Call your doctor for medical advice about side effects. You may report side effects to FDA at 1-800-FDA-1088. Where should I keep my medication? Keep out of the reach of children. Store at room temperature between 15 and 30 degrees C (59 and 85 degrees F). Protect from light. Throw away any unused medication after the expiration date. NOTE: This sheet is a summary. It may not cover all possible information. If you have questions about this medicine, talk to your doctor, pharmacist, or health care provider.  2024 Elsevier/Gold Standard (2020-11-17 00:00:00)

## 2023-05-09 ENCOUNTER — Ambulatory Visit (INDEPENDENT_AMBULATORY_CARE_PROVIDER_SITE_OTHER): Admitting: Internal Medicine

## 2023-05-09 ENCOUNTER — Encounter: Payer: Self-pay | Admitting: Internal Medicine

## 2023-05-09 VITALS — HR 122 | Temp 97.8°F | Resp 18 | Ht 66.1 in | Wt 245.3 lb

## 2023-05-09 DIAGNOSIS — R1114 Bilious vomiting: Secondary | ICD-10-CM

## 2023-05-09 DIAGNOSIS — K529 Noninfective gastroenteritis and colitis, unspecified: Secondary | ICD-10-CM | POA: Diagnosis not present

## 2023-05-09 DIAGNOSIS — R000 Tachycardia, unspecified: Secondary | ICD-10-CM

## 2023-05-09 DIAGNOSIS — T782XXD Anaphylactic shock, unspecified, subsequent encounter: Secondary | ICD-10-CM | POA: Diagnosis not present

## 2023-05-09 DIAGNOSIS — T782XXA Anaphylactic shock, unspecified, initial encounter: Secondary | ICD-10-CM

## 2023-05-09 MED ORDER — IPRATROPIUM BROMIDE 0.06 % NA SOLN: 2.0000 | Freq: Four times a day (QID) | NASAL | 12 refills | Status: DC

## 2023-05-09 NOTE — Progress Notes (Unsigned)
NEW PATIENT Date of Service/Encounter:  05/10/23 Referring provider: Sharlene Dory* Primary care provider: Sharlene Dory, DO  Subjective:  Amber [name] is a 43 y.o. female  presenting today for evaluation of concern for food reaction  History obtained from: chart review and patient.   Discussed the use of AI scribe software for clinical note transcription with the patient, who gave verbal consent to proceed.  History of Present Illness   Amber [name] "Amber [name]" is a 43 year old female who presents with allergic reactions.  For the past few months, she has experienced allergic reactions characterized by gastrointestinal symptoms such as vomiting and skin reactions including rashes and hives. A significant reaction occurred on February 14th after consuming [place] food containing sesame oil and soy sauce, resulting in vomiting, throat tightness, lip swelling, and a rash that caused the ink in her tattoo to become raised. Emergency treatment included an EpiPen, Benadryl, Solu-Medrol, and fluids.  She has been avoiding foods suspected to trigger her symptoms, including wheat, nuts, soy, and sesame.  PCP ordered blood work and which confirmed the above allergies (records not available).   Despite these precautions, she experienced vomiting after consuming gluten-free cookies containing soy. She has a history of allergic reactions to medications and certain foods such as tomatoes and shrimp, which have caused gastrointestinal symptoms and lip swelling in the past.  She experiences chronic diarrhea, which has been a long-standing issue. No new freckles or persistent hives. No nocturnal awakenings to use the bathroom or incontinence.  Since September, she has had a runny nose and occasional flushing, not attributed to her previous cat allergy. She experiences increased heart rate and paleness, which she associates with her anemia and POTS.  Her past medical history  includes POTS, cubital tunnel syndrome, anemia, and B12 deficiency. She worked as a Fish farm manager for 15 years and has a known allergy to cats. She has been in [place] for seven years.   All of the above symptoms started in September 2024, except for prior cat and food allergies (tomato, shrimp).    She is scheduled for colonoscopy for evaluation of anemia, diarrhea and hematology.       Other allergy screening: Asthma: no Rhino conjunctivitis: yes Food allergy: yes Medication allergy: yes Hymenoptera allergy: no Urticaria: yes Eczema:no History of recurrent infections suggestive of immunodeficency: no Vaccinations are up to date.   Past Medical History: Past Medical History:  Diagnosis Date   Anxiety    Asthma    Depression    Diabetes (HCC)    GERD (gastroesophageal reflux disease)    History of chicken pox    History of migraine headaches    Migraines    Pituitary adenoma (HCC)    Seizures (HCC)    Medication List:  Current Outpatient Medications  Medication Sig Dispense Refill   acetaminophen (TYLENOL) 500 MG tablet Take 500 mg by mouth as needed.     cabergoline (DOSTINEX) 0.5 MG tablet 2.5 mg weekly (Two tabs on Mondays, 1 tab on wednesdays and 2 tabs on Fridays) 65 tablet 3   Continuous Glucose Sensor (DEXCOM G7 SENSOR) MISC Use daily to check blood sugar,  DXE11.65 1 each 11   EPINEPHrine (EPIPEN 2-PAK) 0.3 mg/0.3 mL IJ SOAJ injection Inject 0.3 mg into the muscle as needed for anaphylaxis. 1 each 2   escitalopram (LEXAPRO) 10 MG tablet TAKE ONE TABLET BY MOUTH EVERY DAY 90 tablet 3   fenofibrate (TRICOR) 145 MG tablet Take 1 tablet (145  mg total) by mouth daily. 90 tablet 3   gabapentin (NEURONTIN) 300 MG capsule Take 1 capsule by mouth at bedtime.     Galcanezumab-gnlm (EMGALITY) 120 MG/ML SOAJ Inject 120 mg into the skin every 21 ( twenty-one) days. 1.12 mL 11   glucose blood (ACCU-CHEK GUIDE) test strip 1 each by Other route daily in the  afternoon. Use as instructed 100 each 3   ipratropium (ATROVENT) 0.06 % nasal spray Place 2 sprays into both nostrils 4 (four) times daily. 15 mL 12   metformin (FORTAMET) 1000 MG (OSM) 24 hr tablet Take 1 tablet (1,000 mg total) by mouth 2 (two) times daily with a meal. 180 tablet 3   ondansetron (ZOFRAN) 8 MG tablet Take 1 tablet (8 mg total) by mouth as needed for nausea or vomiting. 60 tablet 1   pantoprazole (PROTONIX) 40 MG tablet Take 1 tablet (40 mg total) by mouth 2 (two) times daily. 180 tablet 3   QUEtiapine (SEROQUEL) 300 MG tablet Take 1 tablet (300 mg total) by mouth at bedtime. 90 tablet 3   Riboflavin 400 MG CAPS Take 400 mg by mouth daily.     Rimegepant Sulfate (NURTEC) 75 MG TBDP Take 1 tablet (75 mg total) by mouth as needed (Daily as needed for a Migraine). Maximum 1 tablet in 24 hours.  Quantity 8. 8 tablet 11   rosuvastatin (CRESTOR) 20 MG tablet Take 1 tablet (20 mg total) by mouth daily. 90 tablet 3   Sodium Sulfate-Mag Sulfate-KCl (SUTAB) 8282796952 MG TABS Use as directed for colonoscopy. MANUFACTURER CODES!! BIN: [id number] PCN: CN GROUP: [id number] MEMBER ID: [id number];DGL AS SECONDARY INSURANCE ;NO PRIOR AUTHORIZATION 24 tablet 0   tirzepatide (MOUNJARO) 12.5 MG/0.5ML Pen Inject 12.5 mg into the skin once a week for 28 days. 2 mL 0   topiramate (TOPAMAX) 100 MG tablet Take 1 tablet (100 mg total) by mouth at bedtime. 30 tablet 5   traMADol (ULTRAM) 50 MG tablet TAKE ONE TABLET BY MOUTH EVERY 6 HOURS AS NEEDED 30 tablet 1   famotidine (PEPCID) 20 MG tablet Take 20 mg by mouth 2 (two) times daily. (Patient not taking: Reported on 05/09/2023)     metoprolol tartrate (LOPRESSOR) 25 MG tablet Take 1 tablet (25 mg total) by mouth 2 (two) times daily. (Patient not taking: Reported on 05/09/2023) 180 tablet 3   predniSONE (DELTASONE) 20 MG tablet 2 tabs po daily x 4 days (Patient not taking: Reported on 05/09/2023) 8 tablet 0   [START ON 05/18/2023] tirzepatide (MOUNJARO) 15 MG/0.5ML  Pen Inject 15 mg into the skin once a week. (Patient not taking: Reported on 05/09/2023) 2 mL 2   No current facility-administered medications for this visit.   Known Allergies:  Allergies  Allergen Reactions   Bee Venom Anaphylaxis   Chlorhexidine Hives, Itching and Rash   Ibuprofen Other (See Comments)    GI bleed   Iodine Hives, Itching and Rash   Peanut-Containing Drug Products    Shellfish Allergy Shortness Of Breath, Swelling and Other (See Comments)    Tingling in the lips, mouth and throat   Sulfa Antibiotics Anaphylaxis   Triptans Other (See Comments)    Makes pain worse. Pain in joints   Lunesta [Eszopiclone] Nausea And Vomiting    GI issues   Sesame Seed (Diagnostic) Rash   Soybean-Containing Drug Products Rash   Almond (Diagnostic)    Cashew Nut (Anacardium Occidentale) Skin Test    Hazelnut (Filbert)    Walnut    Wheat  Aspirin Other (See Comments)    UNKNOWN REACTION. Her parents and siblings are allergic to Aspirin.   Nsaids Nausea And Vomiting   Tape Rash    Blisters    Past Surgical History: Past Surgical History:  Procedure Laterality Date   APPENDECTOMY     CHOLECYSTECTOMY     Pituiary Tumor     Tumor Removal Pituitary   Family History: Family History  Problem Relation Age of Onset   Asthma Mother    Diabetes Mother    Migraines Mother    Hyperlipidemia Mother    Bipolar disorder Mother    Irritable bowel syndrome Mother    Asthma Father    Diabetes Father    Colon cancer Father        dx at age 61   Hyperlipidemia Father    Hypertension Father    Migraines Sister    Bipolar disorder Brother    Other Neg Hx        pituitary disorder   Esophageal cancer Neg Hx    Social History: Keziah lives single-family home is 44 years old.  No water damage in the house.  Carpet throughout.  Electric heat, central cooling.  Dogs and cats with access to bedroom, chickens outside the home.  No roaches in the house and bed is to be off floor.  No  dust mite precautions.  Currently retired previously worked as a Museum/gallery conservator.  Not exposed to fumes, chemicals or dust.  HEPA filter in the home and home is not near an [place] industrial area.   ROS:  All other systems negative except as noted per HPI.  Objective:  Pulse (!) 122, temperature 97.8 F (36.6 C), temperature source Temporal, resp. rate 18, height 5' 6.1" (1.679 m), weight 245 lb 4.8 oz (111.3 kg), last menstrual period 04/21/2023, SpO2 97%. Body mass index is 39.47 kg/m. Physical Exam:  General Appearance:  Alert, cooperative, no distress, pale appearing   Head:  Normocephalic, without obvious abnormality, atraumatic  Eyes:  Conjunctiva clear, EOM's intact  Ears EACs normal bilaterally  Nose: Nares normal,  pale mucosa with clear rhinnorhea , no visible anterior polyps, and septum midline  Throat: Lips, tongue normal; teeth and gums normal, normal posterior oropharynx  Neck: Supple, symmetrical  Lungs:   clear to auscultation bilaterally, Respirations unlabored, no coughing  Heart:  Tachycardic and no murmur, Appears well perfused  Extremities: No edema  Skin: Pale appearing and no rashes or lesions on visualized portions of skin  Neurologic: No gross deficits   Diagnostics:  Labs:  Lab Orders         Tryptase         Chronic Urticaria         Alpha-Gal Panel       Assessment and Plan  Assessment and Plan  Anaphylaxis/MCAS/POTS  -We will get labs to screen for unusual causes of anaphylaxis/MCAS to include chronic urticaria index, tryptase, alpha gal -Continue to avoid sesame, soy, wheat, nuts for now recommend avoidance of red meat until alpha gal lab gets back -Written allergy action plan provided today -Continue to carry EpiPen autoinjector -Return to clinic in 1 week for further food allergy testing -After skin testing recommend starting cetirizine 10 mg twice a day, Pepcid 20 mg twice a day -May consider further workup with urine studies, C-kit mutation  evaluation -May consider further treatment with omalizumab, Gastrocrom depending on response    Rhinnorhea  Start -Atrovent (ipatopium) nasal spray 1-2 sprays in each  nostril up to three times daily AS NEEDED for POST NASAL DRIP/RUNNY NOSE/DRAINAGE.  If you become too dry, use less often.   Follow up: for skin testing     This note in its entirety was forwarded to the Provider who requested this consultation.  Other:     Thank you for your kind referral. I appreciate the opportunity to take part in [place] care. Please do not hesitate to contact me with questions.  Sincerely,  Thank you so much for letting me partake in your care today.  Don't hesitate to reach out if you have any additional concerns!  Ferol Luz, MD  [facility] [place], [place]

## 2023-05-09 NOTE — Patient Instructions (Addendum)
Anaphylaxis/MCAS/POTS  -We will get labs to screen for unusual causes of anaphylaxis/MCAS to include chronic urticaria index, tryptase, alpha gal -Continue to avoid sesame, soy, wheat, nuts for now recommend avoidance of red meat until alpha gal lab gets back -Written allergy action plan provided today -Continue to carry EpiPen autoinjector -Return to clinic in 1 week for further food allergy testing -After skin testing recommend starting cetirizine 10 mg twice a day, Pepcid 20 mg twice a day -May consider further workup with urine studies, C-kit mutation evaluation -May consider further treatment with omalizumab, Gastrocrom depending on response    Rhinnorhea  Start -Atrovent (ipatopium) nasal spray 1-2 sprays in each nostril up to three times daily AS NEEDED for POST NASAL DRIP/RUNNY NOSE/DRAINAGE.  If you become too dry, use less often.   Follow up: for skin testing   Thank you so much for letting me partake in your care today.  Don't hesitate to reach out if you have any additional concerns!  Ferol Luz, MD  [facility]- [place], [place]

## 2023-05-11 ENCOUNTER — Other Ambulatory Visit: Payer: Self-pay

## 2023-05-11 ENCOUNTER — Emergency Department (HOSPITAL_BASED_OUTPATIENT_CLINIC_OR_DEPARTMENT_OTHER)

## 2023-05-11 ENCOUNTER — Ambulatory Visit: Payer: Self-pay | Admitting: Family Medicine

## 2023-05-11 ENCOUNTER — Emergency Department (HOSPITAL_BASED_OUTPATIENT_CLINIC_OR_DEPARTMENT_OTHER)
Admission: EM | Admit: 2023-05-11 | Discharge: 2023-05-11 | Disposition: A | Discharge status: Home / Self Care | Attending: Emergency Medicine | Admitting: Emergency Medicine

## 2023-05-11 ENCOUNTER — Encounter (HOSPITAL_BASED_OUTPATIENT_CLINIC_OR_DEPARTMENT_OTHER): Payer: Self-pay

## 2023-05-11 DIAGNOSIS — R55 Syncope and collapse: Secondary | ICD-10-CM | POA: Insufficient documentation

## 2023-05-11 DIAGNOSIS — E119 Type 2 diabetes mellitus without complications: Secondary | ICD-10-CM | POA: Insufficient documentation

## 2023-05-11 DIAGNOSIS — R000 Tachycardia, unspecified: Secondary | ICD-10-CM | POA: Diagnosis not present

## 2023-05-11 DIAGNOSIS — Z20822 Contact with and (suspected) exposure to covid-19: Secondary | ICD-10-CM | POA: Insufficient documentation

## 2023-05-11 DIAGNOSIS — I10 Essential (primary) hypertension: Secondary | ICD-10-CM | POA: Diagnosis not present

## 2023-05-11 DIAGNOSIS — R519 Headache, unspecified: Secondary | ICD-10-CM | POA: Diagnosis present

## 2023-05-11 DIAGNOSIS — Z87891 Personal history of nicotine dependence: Secondary | ICD-10-CM | POA: Insufficient documentation

## 2023-05-11 LAB — URINALYSIS, MICROSCOPIC (REFLEX)

## 2023-05-11 LAB — URINALYSIS, ROUTINE W REFLEX MICROSCOPIC
Bilirubin Urine: NEGATIVE
Glucose, UA: NEGATIVE mg/dL
Ketones, ur: NEGATIVE mg/dL
Nitrite: NEGATIVE
Protein, ur: 30 mg/dL — AB
Specific Gravity, Urine: 1.025 (ref 1.005–1.030)
pH: 6.5 (ref 5.0–8.0)

## 2023-05-11 LAB — BASIC METABOLIC PANEL
Anion gap: 8 (ref 5–15)
BUN: 10 mg/dL (ref 6–20)
CO2: 19 mmol/L — ABNORMAL LOW (ref 22–32)
Calcium: 9.1 mg/dL (ref 8.9–10.3)
Chloride: 109 mmol/L (ref 98–111)
Creatinine, Ser: 0.98 mg/dL (ref 0.44–1.00)
GFR, Estimated: >60 mL/min (ref >60)
Glucose, Bld: 93 mg/dL (ref 70–99)
Potassium: 3.4 mmol/L — ABNORMAL LOW (ref 3.5–5.1)
Sodium: 136 mmol/L (ref 135–145)

## 2023-05-11 LAB — CBC
HCT: 37.8 % (ref 36.0–46.0)
Hemoglobin: 12.6 g/dL (ref 12.0–15.0)
MCH: 27.9 pg (ref 26.0–34.0)
MCHC: 33.3 g/dL (ref 30.0–36.0)
MCV: 83.8 fL (ref 80.0–100.0)
Platelets: 404 10*3/uL — ABNORMAL HIGH (ref 150–400)
RBC: 4.51 MIL/uL (ref 3.87–5.11)
RDW: 16.6 % — ABNORMAL HIGH (ref 11.5–15.5)
WBC: 11 10*3/uL — ABNORMAL HIGH (ref 4.0–10.5)
nRBC: 0 % (ref 0.0–0.2)

## 2023-05-11 LAB — CBG MONITORING, ED: Glucose-Capillary: 77 mg/dL (ref 70–99)

## 2023-05-11 LAB — RESP PANEL BY RT-PCR (RSV, FLU A&B, COVID)  RVPGX2
Influenza A by PCR: NEGATIVE
Influenza B by PCR: NEGATIVE
Resp Syncytial Virus by PCR: NEGATIVE
SARS Coronavirus 2 by RT PCR: NEGATIVE

## 2023-05-11 LAB — PREGNANCY, URINE: Preg Test, Ur: NEGATIVE

## 2023-05-11 MED ORDER — ONDANSETRON 4 MG PO TBDP: 4.0000 mg | ORAL_TABLET | Freq: Three times a day (TID) | ORAL | 0 refills | Status: DC | PRN

## 2023-05-11 NOTE — ED Provider Notes (Signed)
[facility] Provider Note   CSN: [id number] Arrival date & time: 05/11/23  1721     History Chief Complaint  Patient presents with   Loss of Consciousness    HPI Somalia Segler is a 43 y.o. female presenting for chief complaint of syncopal event.  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 States that last night she leaned up against the door because she became lightheaded and then collapsed.  Feels that she had a headache after the fall.  This episode lasted 15 seconds before she awoke.  Today she is had some mild headache as well as nausea without vomiting. Poor p.o. intake feeling lightheaded today..   Patient's recorded medical, surgical, social, medication list and allergies were reviewed in the Snapshot window as part of the initial history.   Review of Systems   Review of Systems  Constitutional:  Positive for fatigue. Negative for chills and fever.  HENT:  Negative for ear pain and sore throat.   Eyes:  Negative for pain and visual disturbance.  Respiratory:  Negative for cough and shortness of breath.   Cardiovascular:  Negative for chest pain and palpitations.  Gastrointestinal:  Negative for abdominal pain and vomiting.  Genitourinary:  Negative for dysuria and hematuria.  Musculoskeletal:  Negative for arthralgias and back pain.  Skin:  Negative for color change and rash.  Neurological:  Positive for syncope and light-headedness. Negative for seizures.  All other systems reviewed and are negative.   Physical Exam Updated Vital Signs BP 111/60 (BP Location: Right Arm)   Pulse (!) 102   Temp 98.3 F (36.8 C)   Resp 16   Ht 5\' 6"  (1.676 m)   Wt 110.7 kg   LMP 04/21/2023 (Exact Date)   SpO2 100%   BMI 39.38 kg/m  Physical Exam Vitals and nursing note reviewed.  Constitutional:      General: She is not in acute distress.     Appearance: She is well-developed.  HENT:     Head: Normocephalic and atraumatic.  Eyes:     Conjunctiva/sclera: Conjunctivae normal.  Cardiovascular:     Rate and Rhythm: Normal rate and regular rhythm.     Heart sounds: No murmur heard. Pulmonary:     Effort: Pulmonary effort is normal. No respiratory distress.     Breath sounds: Normal breath sounds.  Abdominal:     General: There is no distension.     Palpations: Abdomen is soft.     Tenderness: There is no abdominal tenderness. There is no right CVA tenderness or left CVA tenderness.  Musculoskeletal:        General: No swelling or tenderness. Normal range of motion.     Cervical back: Neck supple.  Skin:    General: Skin is warm and dry.  Neurological:     General: No focal deficit present.     Mental Status: She is alert and oriented to person, place, and time. Mental status is at baseline.     Cranial Nerves: No cranial nerve deficit.      ED Course/ Medical Decision Making/ A&P    Procedures Procedures   Medications Ordered in ED Medications - No data to display Medical Decision Making:   Jaszmine Navejas is a 43 y.o. female who presented to the ED today with a syncopal episode detailed above.    Patient placed on continuous vitals and telemetry monitoring while in ED which  was reviewed periodically.  Complete initial physical exam performed, notably the patient  was HDS in NAD.    Reviewed and confirmed nursing documentation for past medical history, family history, social history.    Initial Assessment:   With the patient's presentation of syncope, most likely diagnosis is orthostatic hypotension vs vasovagal episode. Other diagnoses were considered including (but not limited to) arrythmogenic syncope, valvular abnormality, PE, aortic dissection. These are considered less likely due to history of present illness and physical exam findings.   This is most consistent with an acute life/limb threatening illness  complicated by underlying chronic conditions. In particular, concerning cardiac etiology, this is less likely to be the etiology given the lack of chest pain, lack of serious comorbidities including heart failure or CAD.   Additionally, patient's history appears more consistent with benign episodes including orthostatic event vs  vagal episode based on history of similar. Initial Plan:  Screening labs including CBC and Metabolic panel to evaluate for infectious or metabolic etiology of disease.  Urinalysis with reflex culture ordered to evaluate for UTI or relevant urologic/nephrologic pathology.  CXR to evaluate for structural/infectious intrathoracic pathology.  EKG to evaluate for cardiac pathology. Utilization of FAINT scoring detailed above.  CT head to evaluate for structural injury Viral screening to evaluate for infectious pathology Objective evaluation as below reviewed after administration of IVF/Telemetry monitoring  Initial Study Results:   Laboratory  All laboratory results reviewed without evidence of clinically relevant pathology.    EKG EKG was reviewed independently. Rate, rhythm, axis, intervals all examined and without medically relevant abnormality. ST segments without concerns for elevations.    Radiology:  All images reviewed independently. Agree with radiology report at this time.   CT HEAD WO CONTRAST ( ) Result Date: 05/11/2023 CLINICAL DATA:  Trauma, loss of consciousness EXAM: CT HEAD WITHOUT CONTRAST TECHNIQUE: Contiguous axial images were obtained from the base of the skull through the vertex without intravenous contrast. RADIATION DOSE REDUCTION: This exam was performed according to the departmental dose-optimization program which includes automated exposure control, adjustment of the mA and/or kV according to patient size and/or use of iterative reconstruction technique. COMPARISON:  11/24/2022 FINDINGS: Brain: No evidence of acute infarction, hemorrhage,  hydrocephalus, extra-axial collection or mass lesion/mass effect. Vascular: No hyperdense vessel or unexpected calcification. Skull: Normal. Negative for fracture or focal lesion. Sinuses/Orbits: The visualized paranasal sinuses are essentially clear. The mastoid air cells are unopacified. Other: None. IMPRESSION: Normal head CT. Electronically Signed   By: Charline Bills M.D.   On: 05/11/2023 19:24   DG Chest Portable 1 View Result Date: 05/11/2023 CLINICAL DATA:  Syncope. EXAM: PORTABLE CHEST 1 VIEW COMPARISON:  February 08, 2023 FINDINGS: The heart size and mediastinal contours are within normal limits. Both lungs are clear. The visualized skeletal structures are unremarkable. IMPRESSION: No acute cardiopulmonary disease. Electronically Signed   By: Aram Candela M.D.   On: 05/11/2023 18:54   ECHOCARDIOGRAM COMPLETE Result Date: 04/13/2023    ECHOCARDIOGRAM REPORT   Patient Name:   CONNOR FOXWORTHY Date of Exam: 04/13/2023 Medical Rec #:  [id number]          Height:       66.0 in Accession #:    [id number]         Weight:       261.0 lb Date of Birth:  February 19, 1981         BSA:          2.239 m Patient Age:    23  years           BP:           110/76 mmHg Patient Gender: F                  HR:           75 bpm. Exam Location:  [facility] Procedure: 2D Echo, Cardiac Doppler and Color Doppler Indications:    Paroxysmal tachycardia (HCC) [I47.9 (ICD-10-CM)]  History:        Patient has prior history of Echocardiogram examinations, most                 recent 10/02/2019. Anemia, Arrythmias:Tachycardia and                 Palpitaions; Risk Factors:Former Smoker, Diabetes, Dyslipidemia                 and Hypertension.  Sonographer:    Jake Seats RDMS, RVT, RDCS Referring Phys: [id number] Marlyn Corporal MADIREDDY IMPRESSIONS  1. Left ventricular ejection fraction, by estimation, is 60 to 65%. Left ventricular ejection fraction by 2D MOD biplane is 67.3 %. The left ventricle has normal function. The left  ventricle has no regional wall motion abnormalities. Left ventricular diastolic parameters were normal.  2. Right ventricular systolic function is normal. The right ventricular size is normal. There is normal pulmonary artery systolic pressure.  3. The mitral valve is normal in structure. No evidence of mitral valve regurgitation. No evidence of mitral stenosis.  4. The aortic valve is tricuspid. Aortic valve regurgitation is not visualized. No aortic stenosis is present.  5. Aortic DTA is NWV.  6. The inferior vena cava is normal in size with greater than 50% respiratory variability, suggesting right atrial pressure of 3 mmHg. Comparison(s): Echocardiogram done 10/02/19 showed an EF of 60-65%. FINDINGS  Left Ventricle: Left ventricular ejection fraction, by estimation, is 60 to 65%. Left ventricular ejection fraction by 2D MOD biplane is 67.3 %. The left ventricle has normal function. The left ventricle has no regional wall motion abnormalities. The left ventricular internal cavity size was normal in size. There is no left ventricular hypertrophy. Left ventricular diastolic parameters were normal. Normal left ventricular filling pressure. Right Ventricle: The right ventricular size is normal. No increase in right ventricular wall thickness. Right ventricular systolic function is normal. There is normal pulmonary artery systolic pressure. The tricuspid regurgitant velocity is 1.98 m/s, and  with an assumed right atrial pressure of 3 mmHg, the estimated right ventricular systolic pressure is 18.7 mmHg. Left Atrium: Left atrial size was normal in size. Right Atrium: Right atrial size was normal in size. Pericardium: There is no evidence of pericardial effusion. Mitral Valve: The mitral valve is normal in structure. No evidence of mitral valve regurgitation. No evidence of mitral valve stenosis. Tricuspid Valve: The tricuspid valve is normal in structure. Tricuspid valve regurgitation is trivial. No evidence of  tricuspid stenosis. Aortic Valve: The aortic valve is tricuspid. Aortic valve regurgitation is not visualized. No aortic stenosis is present. Aortic valve mean gradient measures 5.0 mmHg. Aortic valve peak gradient measures 8.1 mmHg. Aortic valve area, by VTI measures 3.28 cm. Pulmonic Valve: The pulmonic valve was normal in structure. Pulmonic valve regurgitation is not visualized. No evidence of pulmonic stenosis. Aorta: The aortic arch was not well visualized, the aortic root and ascending aorta are structurally normal, with no evidence of dilitation and DTA is NWV. Venous: A normal flow pattern is recorded from the  right upper pulmonary vein. The inferior vena cava is normal in size with greater than 50% respiratory variability, suggesting right atrial pressure of 3 mmHg. IAS/Shunts: No atrial level shunt detected by color flow Doppler.  LEFT VENTRICLE PLAX 2D                        Biplane EF (MOD) LVIDd:         5.10 cm         LV Biplane EF:   Left LVIDs:         3.10 cm                          ventricular LV PW:         0.70 cm                          ejection LV IVS:        0.90 cm                          fraction by LVOT diam:     2.20 cm                          2D MOD LV SV:         95                               biplane is LV SV Index:   43                               67.3 %. LVOT Area:     3.80 cm                                Diastology                                LV e' medial:    11.00 cm/s LV Volumes (MOD)               LV E/e' medial:  8.6 LV vol d, MOD    97.6 ml       LV e' lateral:   11.10 cm/s A2C:                           LV E/e' lateral: 8.5 LV vol d, MOD    108.0 ml A4C: LV vol s, MOD    33.5 ml A2C: LV vol s, MOD    34.7 ml A4C: LV SV MOD A2C:   64.1 ml LV SV MOD A4C:   108.0 ml LV SV MOD BP:    71.3 ml RIGHT VENTRICLE RV S prime:     12.10 cm/s TAPSE (M-mode): 2.0 cm LEFT ATRIUM             Index        RIGHT ATRIUM           Index LA diam:        3.10 cm 1.38 cm/m   RA  Area:  10.30 cm LA Vol (A2C):   42.2 ml 18.84 ml/m  RA Volume:   21.10 ml  9.42 ml/m LA Vol (A4C):   39.9 ml 17.82 ml/m LA Biplane Vol: 41.1 ml 18.35 ml/m  AORTIC VALVE AV Area (Vmax):    3.51 cm AV Area (Vmean):   3.26 cm AV Area (VTI):     3.28 cm AV Vmax:           142.00 cm/s AV Vmean:          101.000 cm/s AV VTI:            0.291 m AV Peak Grad:      8.1 mmHg AV Mean Grad:      5.0 mmHg LVOT Vmax:         131.00 cm/s LVOT Vmean:        86.500 cm/s LVOT VTI:          0.251 m LVOT/AV VTI ratio: 0.86  AORTA Ao Root diam: 3.15 cm Ao Asc diam:  2.80 cm MITRAL VALVE               TRICUSPID VALVE MV Area (PHT): 4.10 cm    TR Peak grad:   15.7 mmHg MV Decel Time: 185 msec    TR Vmax:        198.00 cm/s MV E velocity: 94.30 cm/s MV A velocity: 68.10 cm/s  SHUNTS MV E/A ratio:  1.38        Systemic VTI:  0.25 m                            Systemic Diam: 2.20 cm Norman Herrlich MD Electronically signed by Norman Herrlich MD Signature Date/Time: 04/13/2023/6:45:45 PM    Final    Final Assessment and Plan:   History of present illness and physical exam findings in the context of these objective findings and not consistent with any acute pathology.  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 Recommend she continue working cardiology in the outpatient setting.  No acute indication for further intervention at this time.  Patient ambulatory tolerating p.o. intake.  Requesting Zofran refill in the outpatient setting  Disposition:  I have considered need for hospitalization, however, considering all of the above, I believe this patient is stable for discharge at this time.  Patient/family educated about specific return precautions for given chief complaint and symptoms.  Patient/family educated about follow-up with PCP.     Patient/family expressed understanding of return precautions and need for follow-up. Patient spoken to regarding all imaging and laboratory results and appropriate follow  up for these results. All education provided in verbal form with additional information in written form. Time was allowed for answering of patient questions. Patient discharged.    Emergency Department Medication Summary:   Medications - No data to display        Clinical Impression:  1. Syncope and collapse      Discharge  Clinical Impression:  1. Syncope and collapse      Discharge   Final Clinical Impression(s) / ED Diagnoses Final diagnoses:  Syncope and collapse    Rx / DC Orders ED Discharge Orders          Ordered    ondansetron (ZOFRAN-ODT) 4 MG disintegrating tablet  Every 8 hours PRN        05/11/23 2000              Emmalie Haigh,  Almeta Monas, MD 05/11/23 2001

## 2023-05-11 NOTE — Telephone Encounter (Signed)
Chief Complaint: Head injury Symptoms: Fatigue, pain Frequency: Constant pain, intermittent fatigue Pertinent Negatives: Patient denies neck pain, vomiting, double vision Disposition: [x] ED /[] Urgent Care (no appt availability in office) / [] Appointment(In office/virtual)/ []  [facility]/ [] Home Care/ [] Refused Recommended Disposition /[] [facility]/ []  Follow-up with PCP Additional Notes: Pt states she hit her head last on the pantry door and became unconscious for approximately 15 seconds. Pt states she woke up on the floor. Pt states she has all of the symptoms of POTS but has not been diagnosed with it yet. Pt has a small abrasion on temple. Pt advised to go to ED. Pt states her mom will bring her there now. This RN educated pt on care advice and pt verbalized understanding. Pt agrees to plan.    Copied from CRM [id number]. Topic: Clinical - Red Word Triage >> May 11, 2023  4:34 PM Denese Killings wrote: Red Word that prompted transfer to Nurse Triage: Patient has P[place] and she had an  episode hit her head on the door handle of pantry. The spot is bruised and she has an abrasion. Reason for Disposition  [1] Knocked out (unconscious) < 1 minute AND [2] now fine  Answer Assessment - Initial Assessment Questions 1. MECHANISM: "How did the injury happen?" For falls, ask: "What height did you fall from?" and "What surface did you fall against?"      Got up open the dog gate last night and got dizzy. Leaned against pantry door and woke up on the floor 2. ONSET: "When did the injury happen?" (Minutes or hours ago)      Last night 3. NEUROLOGIC SYMPTOMS: "Was there any loss of consciousness?" "Are there any other neurological symptoms?"      Yes, briefly 4. MENTAL STATUS: "Does the person know who they are, who you are, and where they are?"      Yes 5. LOCATION: "What part of the head was hit?"      Temple, chin 6. SCALP APPEARANCE: "What does the scalp look like? Is it  bleeding now?" If Yes, ask: "Is it difficult to stop?"      Abrasion on temple and sore spot on chin 7. SIZE: For cuts, bruises, or swelling, ask: "How large is it?" (e.g., inches or centimeters)      Maybe an inch 8. PAIN: "Is there any pain?" If Yes, ask: "How bad is it?"  (e.g., Scale 1-10; or mild, moderate, severe)     6-7 9. OTHER SYMPTOMS: "Do you have any other symptoms?" (e.g., neck pain, vomiting)       Denies  Protocols used: Head Injury-A-AH

## 2023-05-11 NOTE — ED Triage Notes (Signed)
Patient here POV from Home.  Endorses LOC episode last PM that occurred last PM 2200. States she went into the kitchen and became weak and fell backwards onto the wall and sideways onto the floor. Positive head injury. Duration of less than 1 minute.   Currently being assessed for POTS. Currently notes a headache/migraine, fatigue, nausea.   NAD Noted during Triage, A&Ox4. GCS 15. Ambulatory.

## 2023-05-11 NOTE — ED Notes (Signed)
Assumed care of patient at this time. Report received from [place], [place].

## 2023-05-15 ENCOUNTER — Inpatient Hospital Stay

## 2023-05-15 VITALS — BP 100/64 | HR 100 | Temp 98.3°F | Resp 20

## 2023-05-15 DIAGNOSIS — D509 Iron deficiency anemia, unspecified: Secondary | ICD-10-CM | POA: Diagnosis not present

## 2023-05-15 MED ORDER — CYANOCOBALAMIN 1000 MCG/ML IJ SOLN
1000.0000 ug | Freq: Once | INTRAMUSCULAR | Status: AC
  Administered 2023-05-15: 1000 ug via INTRAMUSCULAR
  Filled 2023-05-15: qty 1

## 2023-05-15 NOTE — Patient Instructions (Signed)
 Vitamin B12 Injection What is this medication? Vitamin B12 (VAHY tuh min B12) prevents and treats low vitamin B12 levels in your body. It is used in people who do not get enough vitamin B12 from their diet or when their digestive tract does not absorb enough. Vitamin B12 plays an important role in maintaining the health of your nervous system and red blood cells. This medicine may be used for other purposes; ask your health care provider or pharmacist if you have questions. COMMON BRAND NAME(S): B-12 Compliance Kit, B-12 Injection Kit, Cyomin, Dodex, LA-12, Nutri-Twelve, Physicians EZ Use B-12, Primabalt, Vitamin Deficiency Injectable System - B12 What should I tell my care team before I take this medication? They need to know if you have any of these conditions: Kidney disease Leber's disease Megaloblastic anemia An unusual or allergic reaction to cyanocobalamin, cobalt, other medications, foods, dyes, or preservatives Pregnant or trying to get pregnant Breast-feeding How should I use this medication? This medication is injected into a muscle or deeply under the skin. It is usually given in a clinic or care team's office. However, your care team may teach you how to inject yourself. Follow all instructions. Talk to your care team about the use of this medication in children. Special care may be needed. Overdosage: If you think you have taken too much of this medicine contact a poison control center or emergency room at once. NOTE: This medicine is only for you. Do not share this medicine with others. What if I miss a dose? If you are given your dose at a clinic or care team's office, call to reschedule your appointment. If you give your own injections, and you miss a dose, take it as soon as you can. If it is almost time for your next dose, take only that dose. Do not take double or extra doses. What may interact with this medication? Alcohol Colchicine This list may not describe all possible  interactions. Give your health care provider a list of all the medicines, herbs, non-prescription drugs, or dietary supplements you use. Also tell them if you smoke, drink alcohol, or use illegal drugs. Some items may interact with your medicine. What should I watch for while using this medication? Visit your care team regularly. You may need blood work done while you are taking this medication. You may need to follow a special diet. Talk to your care team. Limit your alcohol intake and avoid smoking to get the best benefit. What side effects may I notice from receiving this medication? Side effects that you should report to your care team as soon as possible: Allergic reactions--skin rash, itching, hives, swelling of the face, lips, tongue, or throat Swelling of the ankles, hands, or feet Trouble breathing Side effects that usually do not require medical attention (report to your care team if they continue or are bothersome): Diarrhea This list may not describe all possible side effects. Call your doctor for medical advice about side effects. You may report side effects to FDA at 1-800-FDA-1088. Where should I keep my medication? Keep out of the reach of children. Store at room temperature between 15 and 30 degrees C (59 and 85 degrees F). Protect from light. Throw away any unused medication after the expiration date. NOTE: This sheet is a summary. It may not cover all possible information. If you have questions about this medicine, talk to your doctor, pharmacist, or health care provider.  2024 Elsevier/Gold Standard (2020-11-17 00:00:00)

## 2023-05-17 ENCOUNTER — Ambulatory Visit (INDEPENDENT_AMBULATORY_CARE_PROVIDER_SITE_OTHER): Admitting: Internal Medicine

## 2023-05-17 DIAGNOSIS — K529 Noninfective gastroenteritis and colitis, unspecified: Secondary | ICD-10-CM

## 2023-05-17 DIAGNOSIS — T782XXD Anaphylactic shock, unspecified, subsequent encounter: Secondary | ICD-10-CM

## 2023-05-17 DIAGNOSIS — R000 Tachycardia, unspecified: Secondary | ICD-10-CM

## 2023-05-17 DIAGNOSIS — R1114 Bilious vomiting: Secondary | ICD-10-CM

## 2023-05-17 NOTE — Patient Instructions (Addendum)
 Anaphylaxis/MCAS/POTS  Allergy test: negative to all foods, positive to grass, weed, mold  -tryptase, alpha gal labs were negative  -CU index still  pending -Continue to avoid sesame, soy, wheat, nuts for now   -Written allergy action plan  -Continue to carry EpiPen autoinjector -Start cetirizine 10 mg twice a day, Pepcid 20 mg twice a day -May consider further workup with urine studies, C-kit mutation evaluation -May consider further treatment with omalizumab, Gastrocrom depending on GI work up    [employer] -Atrovent (ipatopium) nasal spray 1-2 sprays in each nostril up to three times daily AS NEEDED for POST NASAL DRIP/RUNNY NOSE/DRAINAGE.  If you become too dry, use less often.   Follow up: Give [place] a call after your endoscopies let us know results and next steps in ordering further MCAS studies   Thank you so much for letting me partake in your care today.  Don't hesitate to reach out if you have any additional concerns!  Ferol Luz, MD  [facility] [place] [place]  Control of Mold Allergen   Mold and fungi can grow on a variety of surfaces provided certain temperature and moisture conditions exist.  Outdoor molds grow on plants, decaying vegetation and soil.  The major outdoor mold, Alternaria and Cladosporium, are found in very high numbers during hot and dry conditions.  Generally, a late Summer - Fall peak is seen for common outdoor fungal spores.  Rain will temporarily lower outdoor mold spore count, but counts rise rapidly when the rainy period ends.  The most important indoor molds are Aspergillus and Penicillium.  Dark, humid and poorly ventilated basements are ideal sites for mold growth.  The next most common sites of mold growth are the bathroom and the kitchen.  Outdoor (Seasonal) Mold Control  Use air conditioning and keep windows closed Avoid exposure to decaying vegetation. Avoid leaf raking. Avoid grain handling. Consider wearing a face  mask if working in moldy areas.    Indoor (Perennial) Mold Control   Maintain humidity below 50%. Clean washable surfaces with 5% bleach solution. Remove sources e.g. contaminated carpets.    Reducing Pollen Exposure  The American Academy of Allergy, Asthma and Immunology suggests the following steps to reduce your exposure to pollen during allergy seasons.    Do not hang sheets or clothing out to dry; pollen may collect on these items. Do not mow lawns or spend time around freshly cut grass; mowing stirs up pollen. Keep windows closed at night.  Keep car windows closed while driving. Minimize morning activities outdoors, a time when pollen counts are usually at their highest. Stay indoors as much as possible when pollen counts or humidity is high and on windy days when pollen tends to remain in the air longer. Use air conditioning when possible.  Many air conditioners have filters that trap the pollen spores. Use a HEPA room air filter to remove pollen form the indoor air you breathe.

## 2023-05-17 NOTE — Progress Notes (Signed)
 Date of Service/Encounter:  05/17/23  Allergy testing appointment   Initial visit on 05/09/23, seen for anaphylaxis .  Please see that note for additional details.  Today reports for allergy diagnostic testing:    DIAGNOSTICS:  Skin Testing: Environmental allergy panel and select foods. Adequate positive and negative controls Results discussed with patient/family.  Airborne Adult Perc - 05/17/23 1429     Time Antigen Placed 1439    Allergen Manufacturer Waynette Buttery    Location Back    Number of Test 55    1. Control-Buffer 50% Glycerol Negative    2. Control-Histamine 2+    3. Bahia Negative    4. French Southern Territ[place] Negative    5. Johnson Negative    6. Kentucky Blue Negative    7. Meadow Fescue Negative    8. Perennial Rye Negative    9. Timothy Negative    10. Ragweed Mix Negative    11. Cocklebur Negative    12. Plantain,  English Negative    13. Baccharis Negative    14. Dog Fennel Negative    15. Russian Thistle Negative    16. Lamb's Quarters Negative    17. Sheep Sorrell Negative    18. Rough Pigweed Negative    19. Marsh Elder, Rough Negative    20. Mugwort, Common Negative    21. Box, Elder Negative    22. Cedar, red Negative    23. Sweet Gum Negative    24. Pecan Pollen Negative    25. Pine Mix Negative    26. Walnut, Black Pollen Negative    27. Red Mulberry Negative    28. Ash Mix Negative    29. Birch Mix Negative    30. Beech American Negative    31. Cottonwood, Guinea-Bissau Negative    32. Hickory, White Negative    33. Maple Mix Negative    34. Oak, [place] Mix Negative    35. Sycamore Eastern Negative    36. Alternaria Alternata Negative    37. Cladosporium Herbarum Negative    38. Aspergillus Mix Negative    39. Penicillium Mix Negative    40. Bipolaris Sorokiniana (Helminthosporium) Negative    41. Drechslera Spicifera (Curvularia) Negative    42. Mucor Plumbeus Negative    43. Fusarium Moniliforme Negative    44. Aureobasidium Pullulans (pullulara)  Negative    45. Rhizopus Oryzae Negative    46. Botrytis Cinera Negative    47. Epicoccum Nigrum Negative    48. Phoma Betae Negative    49. Dust Mite Mix Negative    50. Cat Hair 10,000 BAU/ml Negative    51.  Dog Epithelia Negative    52. Mixed Feathers Negative    53. Horse Epithelia Negative    54. Cockroach, German Negative    55. Tobacco Leaf Negative             13 Food Perc - 05/17/23 1439       Test Information   Time Antigen Placed 1439    Allergen Manufacturer Waynette Buttery    Location Back    Number of allergen test 13      Food   1. Peanut Negative    2. Soybean Negative    3. Wheat Negative    4. Sesame Negative    5. Milk, Cow Negative    6. Casein Negative    7. Egg White, Chicken Negative    8. Shellfish Mix Negative    9. Fish Mix Negative    10. Cashew  Negative    11. Walnut Food Negative    12. Almond Negative    13. Hazelnut Negative             Intradermal - 05/17/23 1519     Time Antigen Placed 1519    Allergen Manufacturer Other    Location Arm    Number of Test 15    Control Negative    Bahia 2+    French Southern Territ[place] 2+    Johnson Negative    7 Grass 2+    Ragweed Mix 2+    Weed Mix 2+    Tree Mix Negative    Mold 1 2+    Mold 2 Negative    Mold 3 2+    Mold 4 Negative    Mite Mix Negative    Cat Negative    Dog Negative    Cockroach Negative             Allergy testing results were read and interpreted by myself, documented by clinical staff.  Patient provided with copy of allergy testing along with avoidance measures when indicated.   Ferol Luz, MD  [facility]

## 2023-05-19 LAB — ALPHA-GAL PANEL
Allergen Lamb IgE: <0.1 kU/L
Beef IgE: <0.1 kU/L
IgE (Immunoglobulin E), Serum: 20 [IU]/mL (ref 6–495)
O215-IgE Alpha-Gal: <0.1 kU/L
Pork IgE: <0.1 kU/L

## 2023-05-19 LAB — TRYPTASE: Tryptase: 7.1 ug/L (ref 2.2–13.2)

## 2023-05-19 LAB — CHRONIC URTICARIA: cu index: >50 — ABNORMAL HIGH (ref <10)

## 2023-05-22 ENCOUNTER — Encounter: Payer: Self-pay | Admitting: Medical Oncology

## 2023-05-22 ENCOUNTER — Inpatient Hospital Stay (HOSPITAL_BASED_OUTPATIENT_CLINIC_OR_DEPARTMENT_OTHER): Admitting: Medical Oncology

## 2023-05-22 ENCOUNTER — Ambulatory Visit (INDEPENDENT_AMBULATORY_CARE_PROVIDER_SITE_OTHER): Admitting: Family Medicine

## 2023-05-22 ENCOUNTER — Inpatient Hospital Stay: Attending: Medical Oncology

## 2023-05-22 ENCOUNTER — Encounter: Payer: Self-pay | Admitting: Family Medicine

## 2023-05-22 ENCOUNTER — Inpatient Hospital Stay

## 2023-05-22 VITALS — BP 100/76 | HR 66 | Temp 98.6°F | Resp 20 | Ht 66.0 in | Wt 235.8 lb

## 2023-05-22 VITALS — BP 96/61 | HR 96 | Temp 98.6°F | Resp 18 | Ht 66.0 in | Wt 235.0 lb

## 2023-05-22 DIAGNOSIS — E639 Nutritional deficiency, unspecified: Secondary | ICD-10-CM | POA: Insufficient documentation

## 2023-05-22 DIAGNOSIS — R7989 Other specified abnormal findings of blood chemistry: Secondary | ICD-10-CM

## 2023-05-22 DIAGNOSIS — G90A Postural orthostatic tachycardia syndrome (POTS): Secondary | ICD-10-CM | POA: Insufficient documentation

## 2023-05-22 DIAGNOSIS — F418 Other specified anxiety disorders: Secondary | ICD-10-CM

## 2023-05-22 DIAGNOSIS — D509 Iron deficiency anemia, unspecified: Secondary | ICD-10-CM

## 2023-05-22 DIAGNOSIS — E538 Deficiency of other specified B group vitamins: Secondary | ICD-10-CM | POA: Insufficient documentation

## 2023-05-22 DIAGNOSIS — D352 Benign neoplasm of pituitary gland: Secondary | ICD-10-CM | POA: Diagnosis not present

## 2023-05-22 DIAGNOSIS — M62838 Other muscle spasm: Secondary | ICD-10-CM

## 2023-05-22 DIAGNOSIS — R61 Generalized hyperhidrosis: Secondary | ICD-10-CM

## 2023-05-22 DIAGNOSIS — D72829 Elevated white blood cell count, unspecified: Secondary | ICD-10-CM | POA: Diagnosis not present

## 2023-05-22 LAB — CMP (CANCER CENTER ONLY)
ALT: 23 U/L (ref 0–44)
AST: 22 U/L (ref 15–41)
Albumin: 4.9 g/dL (ref 3.5–5.0)
Alkaline Phosphatase: 30 U/L — ABNORMAL LOW (ref 38–126)
Anion gap: 10 (ref 5–15)
BUN: 11 mg/dL (ref 6–20)
CO2: 23 mmol/L (ref 22–32)
Calcium: 10 mg/dL (ref 8.9–10.3)
Chloride: 107 mmol/L (ref 98–111)
Creatinine: 1.04 mg/dL — ABNORMAL HIGH (ref 0.44–1.00)
GFR, Estimated: >60 mL/min (ref >60)
Glucose, Bld: 82 mg/dL (ref 70–99)
Potassium: 3.5 mmol/L (ref 3.5–5.1)
Sodium: 140 mmol/L (ref 135–145)
Total Bilirubin: 0.6 mg/dL (ref 0.0–1.2)
Total Protein: 7.7 g/dL (ref 6.5–8.1)

## 2023-05-22 LAB — CBC WITH DIFFERENTIAL (CANCER CENTER ONLY)
Abs Immature Granulocytes: 0.04 10*3/uL (ref 0.00–0.07)
Basophils Absolute: 0.1 10*3/uL (ref 0.0–0.1)
Basophils Relative: 1 %
Eosinophils Absolute: 0.2 10*3/uL (ref 0.0–0.5)
Eosinophils Relative: 2 %
HCT: 38.3 % (ref 36.0–46.0)
Hemoglobin: 13.1 g/dL (ref 12.0–15.0)
Immature Granulocytes: 0 %
Lymphocytes Relative: 26 %
Lymphs Abs: 2.7 10*3/uL (ref 0.7–4.0)
MCH: 28.8 pg (ref 26.0–34.0)
MCHC: 34.2 g/dL (ref 30.0–36.0)
MCV: 84.2 fL (ref 80.0–100.0)
Monocytes Absolute: 0.6 10*3/uL (ref 0.1–1.0)
Monocytes Relative: 6 %
Neutro Abs: 6.8 10*3/uL (ref 1.7–7.7)
Neutrophils Relative %: 65 %
Platelet Count: 427 10*3/uL — ABNORMAL HIGH (ref 150–400)
RBC: 4.55 MIL/uL (ref 3.87–5.11)
RDW: 14.8 % (ref 11.5–15.5)
WBC Count: 10.4 10*3/uL (ref 4.0–10.5)
nRBC: 0 % (ref 0.0–0.2)

## 2023-05-22 LAB — SAVE SMEAR(SSMR), FOR PROVIDER SLIDE REVIEW

## 2023-05-22 LAB — RETIC PANEL
Immature Retic Fract: 7 % (ref 2.3–15.9)
RBC.: 4.54 MIL/uL (ref 3.87–5.11)
Retic Count, Absolute: 66.3 10*3/uL (ref 19.0–186.0)
Retic Ct Pct: 1.5 % (ref 0.4–3.1)
Reticulocyte Hemoglobin: 33.1 pg (ref >27.9)

## 2023-05-22 LAB — FOLATE: Folate: 4.8 ng/mL — ABNORMAL LOW (ref >5.9)

## 2023-05-22 LAB — FERRITIN: Ferritin: 62 ng/mL (ref 11–307)

## 2023-05-22 LAB — VITAMIN B12: Vitamin B-12: 484 pg/mL (ref 180–914)

## 2023-05-22 MED ORDER — PANTOPRAZOLE SODIUM 40 MG PO TBEC: 40.0000 mg | DELAYED_RELEASE_TABLET | Freq: Two times a day (BID) | ORAL | 3 refills | Status: DC

## 2023-05-22 MED ORDER — QUETIAPINE FUMARATE 300 MG PO TABS: 300.0000 mg | ORAL_TABLET | Freq: Every day | ORAL | 3 refills | Status: DC

## 2023-05-22 MED ORDER — FENOFIBRATE 145 MG PO TABS: 145.0000 mg | ORAL_TABLET | Freq: Every day | ORAL | 3 refills | Status: DC

## 2023-05-22 MED ORDER — CYANOCOBALAMIN 1000 MCG/ML IJ SOLN
1000.0000 ug | Freq: Once | INTRAMUSCULAR | Status: AC
  Administered 2023-05-22: 1000 ug via INTRAMUSCULAR
  Filled 2023-05-22: qty 1

## 2023-05-22 MED ORDER — ESCITALOPRAM OXALATE 10 MG PO TABS: 10.0000 mg | ORAL_TABLET | Freq: Every day | ORAL | 3 refills | Status: DC

## 2023-05-22 MED ORDER — CYANOCOBALAMIN 1000 MCG/ML IJ SOLN: 1000.0000 ug | Freq: Once | INTRAMUSCULAR | Status: DC

## 2023-05-22 NOTE — Patient Instructions (Signed)
 Take 100 mcg of Vit K2 daily for 1 week. If no improvement in the spasms, take 200 mcg until you see Dr. Everlena Cooper.   Keep the diet clean and stay active.  Let us know if you need anything.

## 2023-05-22 NOTE — Progress Notes (Signed)
 Chief Complaint  Patient presents with   Acute Visit    Patient presents today for bilateral feet/toe pain    Subjective: Patient is a 43 y.o. female here for f/u.  The patient was discharged from her endocrinologist for 3 no-shows in the 7-year period.  She is requesting a referral to [facility] neurology.  She takes cabergoline daily.  Compliant, no adverse effects.  She has had spasming of her toes worsening over the past several weeks.  It is painful.  She eats a lot of pickles and stays well hydrated overall.  Past Medical History:  Diagnosis Date   Anxiety    Asthma    Depression    Diabetes (HCC)    GERD (gastroesophageal reflux disease)    History of chicken pox    History of migraine headaches    Migraines    Pituitary adenoma (HCC)    Seizures (HCC)     Objective: BP 100/76   Pulse 66   Temp 98.6 F (37 C)   Resp 20   Ht 5\' 6"  (1.676 m)   Wt 235 lb 12.8 oz (107 kg)   LMP 04/21/2023 (Exact Date)   SpO2 99%   BMI 38.06 kg/m  General: Awake, appears stated age Lungs: No accessory muscle use Psych: Age appropriate judgment and insight, normal affect and mood  Assessment and Plan: Pituitary adenoma (HCC) - Plan: Ambulatory referral to Endocrinology  Leg muscle spasm  Anxiety with depression - Plan: escitalopram (LEXAPRO) 10 MG tablet  Refer back to endocrinology.  Continue cabergoline to 0.5 mg weekly. Recommended she try 100 mcg of vitamin K2 daily for a week and then increase to 200 mcg daily if no better.  She sees Dr. Everlena Cooper of the neurology team in a few weeks and will bring this up with him if no better. Follow-up as originally scheduled. The patient voiced understanding and agreement to the plan.  Amber Roche [place], DO 05/22/23  3:20 PM

## 2023-05-22 NOTE — Patient Instructions (Signed)
 Vitamin B12 Injection What is this medication? Vitamin B12 (VAHY tuh min B12) prevents and treats low vitamin B12 levels in your body. It is used in people who do not get enough vitamin B12 from their diet or when their digestive tract does not absorb enough. Vitamin B12 plays an important role in maintaining the health of your nervous system and red blood cells. This medicine may be used for other purposes; ask your health care provider or pharmacist if you have questions. COMMON BRAND NAME(S): B-12 Compliance Kit, B-12 Injection Kit, Cyomin, Dodex, LA-12, Nutri-Twelve, Physicians EZ Use B-12, Primabalt, Vitamin Deficiency Injectable System - B12 What should I tell my care team before I take this medication? They need to know if you have any of these conditions: Kidney disease Leber's disease Megaloblastic anemia An unusual or allergic reaction to cyanocobalamin, cobalt, other medications, foods, dyes, or preservatives Pregnant or trying to get pregnant Breast-feeding How should I use this medication? This medication is injected into a muscle or deeply under the skin. It is usually given in a clinic or care team's office. However, your care team may teach you how to inject yourself. Follow all instructions. Talk to your care team about the use of this medication in children. Special care may be needed. Overdosage: If you think you have taken too much of this medicine contact a poison control center or emergency room at once. NOTE: This medicine is only for you. Do not share this medicine with others. What if I miss a dose? If you are given your dose at a clinic or care team's office, call to reschedule your appointment. If you give your own injections, and you miss a dose, take it as soon as you can. If it is almost time for your next dose, take only that dose. Do not take double or extra doses. What may interact with this medication? Alcohol Colchicine This list may not describe all possible  interactions. Give your health care provider a list of all the medicines, herbs, non-prescription drugs, or dietary supplements you use. Also tell them if you smoke, drink alcohol, or use illegal drugs. Some items may interact with your medicine. What should I watch for while using this medication? Visit your care team regularly. You may need blood work done while you are taking this medication. You may need to follow a special diet. Talk to your care team. Limit your alcohol intake and avoid smoking to get the best benefit. What side effects may I notice from receiving this medication? Side effects that you should report to your care team as soon as possible: Allergic reactions--skin rash, itching, hives, swelling of the face, lips, tongue, or throat Swelling of the ankles, hands, or feet Trouble breathing Side effects that usually do not require medical attention (report to your care team if they continue or are bothersome): Diarrhea This list may not describe all possible side effects. Call your doctor for medical advice about side effects. You may report side effects to FDA at 1-800-FDA-1088. Where should I keep my medication? Keep out of the reach of children. Store at room temperature between 15 and 30 degrees C (59 and 85 degrees F). Protect from light. Throw away any unused medication after the expiration date. NOTE: This sheet is a summary. It may not cover all possible information. If you have questions about this medicine, talk to your doctor, pharmacist, or health care provider.  2024 Elsevier/Gold Standard (2020-11-17 00:00:00)

## 2023-05-22 NOTE — Progress Notes (Signed)
 Hematology and Oncology Follow Up Visit  Amber [name] [id number] February 16, 1981 43 y.o. 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 GI evaluation was recommended given her family history  (colon cancer of father). She was also start on IV venofer and is S/P one cycle of treatment.   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 She is seeing a rheumatologist and immunologist. She is now on zyrtec and pepcid. Feeling a bit better overall. Still fatigued.  She has a Endo/Colo scheduled on March 11th.   She also sees her new cardiologist on the 24th of this month. She is hoping to get more answers regarding her POTS.   She reports tolerating the IV iron well without side effects.   We recently started her on B12 injections. She has tolerated these well but has not noticed any changes in symptoms.   There has been no bleeding to her knowledge aside from her menstrual cycles: denies epistaxis, gingivitis, hemoptysis, hematemesis, hematuria, melena, excessive bruising, blood donation.   Menstrual cycles are regular   Wt Readings from Last 3 Encounters:  05/22/23 235 lb (106.6 kg)  05/22/23 235 lb 12.8 oz (107 kg)  05/11/23 244 lb (110.7 kg)     Medications:   Current Outpatient Medications:     acetaminophen (TYLENOL) 500 MG tablet, Take 500 mg by mouth as needed., Disp: , Rfl:    cabergoline (DOSTINEX) 0.5 MG tablet, 2.5 mg weekly (Two tabs on Mondays, 1 tab on wednesdays and 2 tabs on Fridays), Disp: 65 tablet, Rfl: 3   cetirizine (ZYRTEC) 10 MG chewable tablet, Chew 10 mg by mouth in the morning and at bedtime., Disp: , Rfl:    Continuous Glucose Sensor (DEXCOM G7 SENSOR) MISC, Use daily to check blood sugar,  DXE11.65, Disp: 1 each, Rfl: 11   escitalopram (LEXAPRO) 10 MG tablet, Take 1 tablet (10 mg total) by mouth daily., Disp: 90 tablet, Rfl: 3   famotidine (PEPCID) 20 MG tablet, Take 20 mg by mouth 2 (two) times daily., Disp: , Rfl:    fenofibrate (TRICOR) 145 MG tablet, Take 1 tablet (145 mg total) by mouth daily., Disp: 90 tablet, Rfl: 3   gabapentin (NEURONTIN) 300 MG capsule, Take 1 capsule by mouth at bedtime., Disp: , Rfl:    Galcanezumab-gnlm (EMGALITY) 120 MG/ML SOAJ, Inject 120 mg into the skin every 21 ( twenty-one) days., Disp: 1.12 mL, Rfl: 11   glucose blood (ACCU-CHEK GUIDE) test strip, 1 each by Other route daily in the afternoon. Use as instructed, Disp: 100 each, Rfl: 3   ipratropium (ATROVENT) 0.06 % nasal spray, Place 2 sprays into both nostrils 4 (four) times daily., Disp: 15 mL, Rfl: 12   metoprolol tartrate (LOPRESSOR)  25 MG tablet, Take 1 tablet (25 mg total) by mouth 2 (two) times daily., Disp: 180 tablet, Rfl: 3   ondansetron (ZOFRAN) 8 MG tablet, Take 1 tablet (8 mg total) by mouth as needed for nausea or vomiting., Disp: 60 tablet, Rfl: 1   pantoprazole (PROTONIX) 40 MG tablet, Take 1 tablet (40 mg total) by mouth 2 (two) times daily., Disp: 180 tablet, Rfl: 3   QUEtiapine (SEROQUEL) 300 MG tablet, Take 1 tablet (300 mg total) by mouth at bedtime., Disp: 90 tablet, Rfl: 3   Riboflavin 400 MG CAPS, Take 400 mg by mouth daily., Disp: , Rfl:    Rimegepant Sulfate (NURTEC) 75 MG TBDP, Take 1 tablet (75 mg total) by mouth as needed (Daily as needed for a  Migraine). Maximum 1 tablet in 24 hours.  Quantity 8., Disp: 8 tablet, Rfl: 11   rosuvastatin (CRESTOR) 20 MG tablet, Take 1 tablet (20 mg total) by mouth daily., Disp: 90 tablet, Rfl: 3   Sodium Sulfate-Mag Sulfate-KCl (SUTAB) [phone] MG TABS, Use as directed for colonoscopy. MANUFACTURER CODES!! BIN: [id number] PCN: CN GROUP: [id number] MEMBER ID: [id number];[employer] AS SECONDARY INSURANCE ;NO PRIOR AUTHORIZATION, Disp: 24 tablet, Rfl: 0   tirzepatide (MOUNJARO) 15 MG/0.5ML Pen, Inject 15 mg into the skin once a week., Disp: 2 mL, Rfl: 2   topiramate (TOPAMAX) 100 MG tablet, Take 1 tablet (100 mg total) by mouth at bedtime., Disp: 30 tablet, Rfl: 5   traMADol (ULTRAM) 50 MG tablet, TAKE ONE TABLET BY MOUTH EVERY 6 HOURS AS NEEDED, Disp: 30 tablet, Rfl: 1   EPINEPHrine (EPIPEN 2-PAK) 0.3 mg/0.3 mL IJ SOAJ injection, Inject 0.3 mg into the muscle as needed for anaphylaxis. (Patient not taking: Reported on 05/22/2023), Disp: 1 each, Rfl: 2 No current facility-administered medications for this visit.  Facility-Administered Medications Ordered in Other Visits:    cyanocobalamin (VITAMIN B12) injection 1,000 mcg, 1,000 mcg, Intramuscular, Once, Amber [name] M, [place]  Allergies:  Allergies  Allergen Reactions   Bee Venom Anaphylaxis   Chlorhexidine Hives, Itching and Rash   Ibuprofen Other (See Comments)    GI bleed   Iodine Hives, Itching and Rash   Peanut-Containing Drug Products    Shellfish Allergy Shortness Of Breath, Swelling and Other (See Comments)    Tingling in the lips, mouth and throat   Sulfa Antibiotics Anaphylaxis   Triptans Other (See Comments)    Makes pain worse. Pain in joints   Lunesta [Eszopiclone] Nausea And Vomiting    GI issues   Sesame Seed (Diagnostic) Rash   Soybean-Containing Drug Products Rash   Almond (Diagnostic)    Cashew Nut (Anacardium Occidentale) Skin Test    Hazelnut (Filbert)    Walnut    Wheat    Aspirin Other (See Comments)    UNKNOWN REACTION. Her  parents and siblings are allergic to Aspirin.   Nsaids Nausea And Vomiting   Tape Rash    Blisters     Past Medical History, Surgical history, Social history, and Family History were reviewed and updated.  Review of Systems: As stated above in HPI   Physical Exam:  height is 5\' 6"  (1.676 m) and weight is 235 lb (106.6 kg). Her oral temperature is 98.6 F (37 C). Her blood pressure is 96/61 and her pulse is 96. Her respiration is 18 and oxygen saturation is 100%.   Physical Exam General: NAD Cardiovascular: regular rate and rhythm Pulmonary: clear ant fields Abdomen: soft, nontender, + bowel sounds GU: no suprapubic tenderness Extremities:  no edema, no joint deformities Skin: no rashes Neurological: Weakness but otherwise nonfocal   Lab Results  Component Value Date   WBC 10.4 05/22/2023   HGB 13.1 05/22/2023   HCT 38.3 05/22/2023   MCV 84.2 05/22/2023   PLT 427 (H) 05/22/2023     Chemistry      Component Value Date/Time   NA 140 05/22/2023 1442   K 3.5 05/22/2023 1442   CL 107 05/22/2023 1442   CO2 23 05/22/2023 1442   BUN 11 05/22/2023 1442   CREATININE 1.04 (H) 05/22/2023 1442   CREATININE 0.99 12/24/2019 1316      Component Value Date/Time   CALCIUM 10.0 05/22/2023 1442   ALKPHOS 30 (L) 05/22/2023 1442   AST 22 05/22/2023 1442   ALT 23 05/22/2023 1442   BILITOT 0.6 05/22/2023 1442     Encounter Diagnoses  Name Primary?   Iron deficiency anemia, unspecified iron deficiency anemia type Yes   Leukocytosis, unspecified type    Low vitamin B12 level    Nutritional deficiency    Assessment and Plan- Patient is a 43 y.o. female with multifactorial anemia. She has IDA secondary to malabsorption(GERD/PPI use/ MCAS) and from chronic loss (menstrual cycles). She also has a history of nutritional deficiencies.   Glad to hear that she is getting some answers to her symptoms. MCAS definitely could play a role in her nutritional deficiencies as well.  IV iron if  needed based on labs- pending at this time.  We will follow her closely at this time. We will plan to see her in 1 month or sooner as needed.   Disposition: B12 today RTC 1 month APP, labs (CBC w/, CMP, retic, iron, ferritin, B12, folate, thiamine), B12   Clent Jacks PA-C 3/3/20253:51 PM

## 2023-05-23 ENCOUNTER — Other Ambulatory Visit: Payer: Self-pay | Admitting: Medical Oncology

## 2023-05-23 ENCOUNTER — Ambulatory Visit: Admitting: Family Medicine

## 2023-05-23 ENCOUNTER — Encounter: Payer: Self-pay | Admitting: Medical Oncology

## 2023-05-23 DIAGNOSIS — E538 Deficiency of other specified B group vitamins: Secondary | ICD-10-CM

## 2023-05-23 LAB — IRON AND IRON BINDING CAPACITY (CC-WL,HP ONLY)
Iron: 62 ug/dL (ref 28–170)
Saturation Ratios: 12 % (ref 10.4–31.8)
TIBC: 500 ug/dL — ABNORMAL HIGH (ref 250–450)
UIBC: 438 ug/dL (ref 148–442)

## 2023-05-23 MED ORDER — FOLIC ACID 1 MG PO TABS: 1.0000 mg | ORAL_TABLET | Freq: Every day | ORAL | 6 refills | Status: DC

## 2023-05-24 ENCOUNTER — Ambulatory Visit

## 2023-05-25 ENCOUNTER — Other Ambulatory Visit: Payer: Self-pay | Admitting: Neurology

## 2023-05-28 ENCOUNTER — Encounter: Payer: Self-pay | Admitting: Certified Registered Nurse Anesthetist

## 2023-05-30 ENCOUNTER — Ambulatory Visit: Admitting: Gastroenterology

## 2023-05-30 ENCOUNTER — Encounter: Payer: Self-pay | Admitting: Gastroenterology

## 2023-05-30 VITALS — BP 94/58 | HR 85 | Temp 98.3°F | Resp 12 | Ht 66.0 in | Wt 249.0 lb

## 2023-05-30 DIAGNOSIS — K6289 Other specified diseases of anus and rectum: Secondary | ICD-10-CM | POA: Diagnosis not present

## 2023-05-30 DIAGNOSIS — K319 Disease of stomach and duodenum, unspecified: Secondary | ICD-10-CM | POA: Diagnosis not present

## 2023-05-30 DIAGNOSIS — Z800 Family history of malignant neoplasm of digestive organs: Secondary | ICD-10-CM

## 2023-05-30 DIAGNOSIS — R1310 Dysphagia, unspecified: Secondary | ICD-10-CM | POA: Diagnosis not present

## 2023-05-30 DIAGNOSIS — K219 Gastro-esophageal reflux disease without esophagitis: Secondary | ICD-10-CM

## 2023-05-30 DIAGNOSIS — K3189 Other diseases of stomach and duodenum: Secondary | ICD-10-CM | POA: Diagnosis not present

## 2023-05-30 DIAGNOSIS — D509 Iron deficiency anemia, unspecified: Secondary | ICD-10-CM

## 2023-05-30 DIAGNOSIS — Z1211 Encounter for screening for malignant neoplasm of colon: Secondary | ICD-10-CM

## 2023-05-30 DIAGNOSIS — R1319 Other dysphagia: Secondary | ICD-10-CM

## 2023-05-30 DIAGNOSIS — K648 Other hemorrhoids: Secondary | ICD-10-CM | POA: Diagnosis not present

## 2023-05-30 DIAGNOSIS — E538 Deficiency of other specified B group vitamins: Secondary | ICD-10-CM

## 2023-05-30 DIAGNOSIS — K640 First degree hemorrhoids: Secondary | ICD-10-CM

## 2023-05-30 MED ORDER — SODIUM CHLORIDE 0.9 % IV SOLN: 500.0000 mL | Freq: Once | INTRAVENOUS | Status: DC

## 2023-05-30 NOTE — Op Note (Signed)
 [facility] Patient Name: Amber [name] Procedure Date: 05/30/2023 1:35 PM MRN: [id number] Endoscopist: Doristine Locks , MD, [id number] Age: 43 Referring MD:  Date of Birth: 1981-03-21 Gender: Female Account #: [id number] Procedure:                Upper GI endoscopy Indications:              Iron deficiency anemia, Dysphagia, Heartburn,                            Suspected esophageal reflux, B12 deficiency Medicines:                Monitored Anesthesia Care Procedure:                Pre-Anesthesia Assessment:                           - Prior to the procedure, a History and Physical                            was performed, and patient medications and                            allergies were reviewed. The patient's tolerance of                            previous anesthesia was also reviewed. The risks                            and benefits of the procedure and the sedation                            options and risks were discussed with the patient.                            All questions were answered, and informed consent                            was obtained. Prior Anticoagulants: The patient has                            taken no anticoagulant or antiplatelet agents. ASA                            Grade Assessment: III - A patient with severe                            systemic disease. After reviewing the risks and                            benefits, the patient was deemed in satisfactory                            condition to undergo the procedure.  After obtaining informed consent, the endoscope was                            passed under direct vision. Throughout the                            procedure, the patient's blood pressure, pulse, and                            oxygen saturations were monitored continuously. The                            Olympus Scope [id number] was introduced through the                             mouth, and advanced to the second part of duodenum.                            The upper GI endoscopy was accomplished without                            difficulty. The patient tolerated the procedure                            well. Scope In: Scope Out: Findings:                 The examined esophagus was normal. A guidewire was                            placed and the scope was withdrawn. Dilation was                            performed with a Savary dilator with no resistance                            at 17 mm. The dilation site was examined following                            endoscope reinsertion and showed no bleeding,                            mucosal tear or perforation. Estimated blood loss:                            none. Biopsies were then obtained from the proximal                            and distal esophagus with cold forceps for                            histology of suspected eosinophilic esophagitis.  Estimated blood loss was minimal.                           The Z-line was regular and was found 40 cm from the                            incisors.                           The entire examined stomach was normal. Biopsies                            were taken with a cold forceps for histology and                            Helicobacter pylori testing. Estimated blood loss                            was minimal.                           The examined duodenum was normal. Biopsies were                            taken with a cold forceps for histology. Estimated                            blood loss was minimal. Complications:            No immediate complications. Estimated Blood Loss:     Estimated blood loss was minimal. Impression:               - Normal esophagus. Dilated with 17 mm Savary                            dilator. Biopsies were taken with a cold forceps                            for evaluation of eosinophilic  esophagitis.                           - Z-line regular, 40 cm from the incisors.                           - Normal stomach. Biopsied.                           - Normal examined duodenum. Biopsied. Recommendation:           - Patient has a contact number available for                            emergencies. The signs and symptoms of potential                            delayed complications were discussed with the  patient. Return to normal activities tomorrow.                            Written discharge instructions were provided to the                            patient.                           - Resume previous diet.                           - Continue present medications.                           - Await pathology results. Doristine Locks, MD 05/30/2023 1:61:09 PM

## 2023-05-30 NOTE — Progress Notes (Signed)
 GASTROENTEROLOGY PROCEDURE H&P NOTE   Primary Care Physician: Sharlene Dory, DO    Reason for Procedure:  GERD, dysphagia, iron deficiency anemia, family history of colon cancer  Plan:    EGD, colonoscopy  Patient is appropriate for endoscopic procedure(s) in the ambulatory ([facility]) setting.  The nature of the procedure, as well as the risks, benefits, and alternatives were carefully and thoroughly reviewed with the patient. Ample time for discussion and questions allowed. The patient understood, was satisfied, and agreed to proceed.     HPI: Amber [name] is a 43 y.o. female who presents for EGD for evaluation of intermittent solid food dysphagia, pill dysphagia, and reflux along with evaluation of iron deficiency anemia and B12 deficiency.  Also presenting for colonoscopy due to family history of colon cancer and for evaluation of IDA.  Holding [place] for procedures today.  FHx n/f father with CRC diagnosed in his 64's, with recurrence of stage IV metastatic disease at age 26. Brother with sclerosing mesenteritis. Sister with ?splenic mass requiring splenectomy.    Endoscopic history: -EGD (01/2019): Reflux changes in lower esophagus without intestinal metaplasia, no stricture, empiric dilation with 54 Fr Maloney, moderate Non-H. pylori gastritis.  Trialed high-dose PPI  Past Medical History:  Diagnosis Date   Anxiety    Asthma    Depression    Diabetes (HCC)    GERD (gastroesophageal reflux disease)    History of chicken pox    History of migraine headaches    Migraines    Pituitary adenoma (HCC)    Seizures (HCC)     Past Surgical History:  Procedure Laterality Date   APPENDECTOMY     CHOLECYSTECTOMY     Pituiary Tumor     Tumor Removal Pituitary    Prior to Admission medications   Medication Sig Start Date End Date Taking? Authorizing Provider  acetaminophen (TYLENOL) 500 MG tablet Take 500 mg by mouth as needed.    [provider]   cabergoline (DOSTINEX) 0.5 MG tablet 2.5 mg weekly (Two tabs on Mondays, 1 tab on wednesdays and 2 tabs on Fridays) 01/09/23   Shamleffer, Konrad Dolores, MD  cetirizine (ZYRTEC) 10 MG chewable tablet Chew 10 mg by mouth in the morning and at bedtime.    [provider]  Continuous Glucose Sensor (DEXCOM G7 SENSOR) MISC Use daily to check blood sugar,  DXE11.65 01/30/23   Wendling, Jilda Roche, DO  EPINEPHrine (EPIPEN 2-PAK) 0.3 mg/0.3 mL IJ SOAJ injection Inject 0.3 mg into the muscle as needed for anaphylaxis. Patient not taking: Reported on 05/22/2023 03/17/23   Sharlene Dory, DO  escitalopram (LEXAPRO) 10 MG tablet Take 1 tablet (10 mg total) by mouth daily. 05/22/23   Sharlene Dory, DO  famotidine (PEPCID) 20 MG tablet Take 20 mg by mouth 2 (two) times daily.    [provider]  fenofibrate (TRICOR) 145 MG tablet Take 1 tablet (145 mg total) by mouth daily. 05/22/23   Sharlene Dory, DO  folic acid (FOLVITE) 1 MG tablet Take 1 tablet (1 mg total) by mouth daily. 05/23/23   Rushie Chestnut, PA-C  gabapentin (NEURONTIN) 300 MG capsule Take 1 capsule by mouth at bedtime. 01/25/23   [provider]  Galcanezumab-gnlm (EMGALITY) 120 MG/ML SOAJ Inject 120 mg into the skin every 21 ( twenty-one) days. 01/06/23   Everlena Cooper, Adam R, DO  glucose blood (ACCU-CHEK GUIDE) test strip 1 each by Other route daily in the afternoon. Use as instructed 01/06/23  Shamleffer, Konrad Dolores, MD  ipratropium (ATROVENT) 0.06 % nasal spray Place 2 sprays into both nostrils 4 (four) times daily. 05/09/23   Ferol Luz, MD  metoprolol tartrate (LOPRESSOR) 25 MG tablet Take 1 tablet (25 mg total) by mouth 2 (two) times daily. 03/13/23 06/11/23  Madireddy, Marlyn Corporal, MD  ondansetron (ZOFRAN) 8 MG tablet Take 1 tablet (8 mg total) by mouth as needed for nausea or vomiting. 01/27/23   Everlena Cooper, Adam R, DO  pantoprazole (PROTONIX) 40 MG tablet Take 1 tablet (40 mg total) by  mouth 2 (two) times daily. 05/22/23   Sharlene Dory, DO  QUEtiapine (SEROQUEL) 300 MG tablet Take 1 tablet (300 mg total) by mouth at bedtime. 05/22/23   Sharlene Dory, DO  Riboflavin 400 MG CAPS Take 400 mg by mouth daily.    [provider]  Rimegepant Sulfate (NURTEC) 75 MG TBDP Take 1 tablet (75 mg total) by mouth as needed (Daily as needed for a Migraine). Maximum 1 tablet in 24 hours.  Quantity 8. 06/22/22   Everlena Cooper, Adam R, DO  rosuvastatin (CRESTOR) 20 MG tablet Take 1 tablet (20 mg total) by mouth daily. 09/01/22   Sharlene Dory, DO  Sodium Sulfate-Mag Sulfate-KCl (SUTAB) [phone] MG TABS Use as directed for colonoscopy. MANUFACTURER CODES!! BIN: [id number] PCN: CN GROUP: [id number] MEMBER ID: [id number];VHQ AS SECONDARY INSURANCE ;NO PRIOR AUTHORIZATION 05/02/23   Atiana Levier V, DO  tirzepatide [facility]) 15 MG/0.5ML Pen Inject 15 mg into the skin once a week. 05/18/23   Sharlene Dory, DO  topiramate (TOPAMAX) 100 MG tablet Take 1 tablet (100 mg total) by mouth at bedtime. 01/06/23   Drema Dallas, DO  traMADol (ULTRAM) 50 MG tablet TAKE ONE TABLET BY MOUTH EVERY 6 HOURS AS NEEDED 05/26/23   Drema Dallas, DO    Current Outpatient Medications  Medication Sig Dispense Refill   acetaminophen (TYLENOL) 500 MG tablet Take 500 mg by mouth as needed.     cabergoline (DOSTINEX) 0.5 MG tablet 2.5 mg weekly (Two tabs on Mondays, 1 tab on wednesdays and 2 tabs on Fridays) 65 tablet 3   cetirizine (ZYRTEC) 10 MG chewable tablet Chew 10 mg by mouth in the morning and at bedtime.     Continuous Glucose Sensor (DEXCOM G7 SENSOR) MISC Use daily to check blood sugar,  DXE11.65 1 each 11   EPINEPHrine (EPIPEN 2-PAK) 0.3 mg/0.3 mL IJ SOAJ injection Inject 0.3 mg into the muscle as needed for anaphylaxis. (Patient not taking: Reported on 05/22/2023) 1 each 2   escitalopram (LEXAPRO) 10 MG tablet Take 1 tablet (10 mg total) by mouth daily. 90 tablet 3   famotidine  (PEPCID) 20 MG tablet Take 20 mg by mouth 2 (two) times daily.     fenofibrate (TRICOR) 145 MG tablet Take 1 tablet (145 mg total) by mouth daily. 90 tablet 3   folic acid (FOLVITE) 1 MG tablet Take 1 tablet (1 mg total) by mouth daily. 30 tablet 6   gabapentin (NEURONTIN) 300 MG capsule Take 1 capsule by mouth at bedtime.     Galcanezumab-gnlm (EMGALITY) 120 MG/ML SOAJ Inject 120 mg into the skin every 21 ( twenty-one) days. 1.12 mL 11   glucose blood (ACCU-CHEK GUIDE) test strip 1 each by Other route daily in the afternoon. Use as instructed 100 each 3   ipratropium (ATROVENT) 0.06 % nasal spray Place 2 sprays into both nostrils 4 (four) times daily. 15 mL 12   metoprolol tartrate (LOPRESSOR) 25  MG tablet Take 1 tablet (25 mg total) by mouth 2 (two) times daily. 180 tablet 3   ondansetron (ZOFRAN) 8 MG tablet Take 1 tablet (8 mg total) by mouth as needed for nausea or vomiting. 60 tablet 1   pantoprazole (PROTONIX) 40 MG tablet Take 1 tablet (40 mg total) by mouth 2 (two) times daily. 180 tablet 3   QUEtiapine (SEROQUEL) 300 MG tablet Take 1 tablet (300 mg total) by mouth at bedtime. 90 tablet 3   Riboflavin 400 MG CAPS Take 400 mg by mouth daily.     Rimegepant Sulfate (NURTEC) 75 MG TBDP Take 1 tablet (75 mg total) by mouth as needed (Daily as needed for a Migraine). Maximum 1 tablet in 24 hours.  Quantity 8. 8 tablet 11   rosuvastatin (CRESTOR) 20 MG tablet Take 1 tablet (20 mg total) by mouth daily. 90 tablet 3   Sodium Sulfate-Mag Sulfate-KCl (SUTAB) [phone] MG TABS Use as directed for colonoscopy. MANUFACTURER CODES!! BIN: [id number] PCN: CN GROUP: [id number] MEMBER ID: [id number];GMW AS SECONDARY INSURANCE ;NO PRIOR AUTHORIZATION 24 tablet 0   tirzepatide (MOUNJARO) 15 MG/0.5ML Pen Inject 15 mg into the skin once a week. 2 mL 2   topiramate (TOPAMAX) 100 MG tablet Take 1 tablet (100 mg total) by mouth at bedtime. 30 tablet 5   traMADol (ULTRAM) 50 MG tablet TAKE ONE TABLET BY MOUTH EVERY 6  HOURS AS NEEDED 15 tablet 0   No current facility-administered medications for this visit.    Allergies as of 05/30/2023 - Review Complete 05/28/2023  Allergen Reaction Noted   Bee venom Anaphylaxis 04/17/2023   Chlorhexidine Hives, Itching, and Rash 03/23/2017   Ibuprofen Other (See Comments) 01/27/2016   Iodine Hives, Itching, and Rash 01/27/2016   Peanut-containing drug products  05/05/2023   Shellfish allergy Shortness Of Breath, Swelling, and Other (See Comments) 07/20/2018   Sulfa antibiotics Anaphylaxis 01/27/2016   Triptans Other (See Comments) 04/23/2021   Lunesta [eszopiclone] Nausea And Vomiting 08/27/2019   Sesame seed (diagnostic) Rash 05/05/2023   Soybean-containing drug products Rash 05/05/2023   Almond (diagnostic)  05/05/2023   Cashew nut (anacardium occidentale) skin test  05/05/2023   Hazelnut (filbert)  05/05/2023   Walnut  05/05/2023   Wheat  05/05/2023   Aspirin Other (See Comments) 01/27/2016   Nsaids Nausea And Vomiting 01/27/2016   Tape Rash 01/28/2016    Family History  Problem Relation Age of Onset   Asthma Mother    Diabetes Mother    Migraines Mother    Hyperlipidemia Mother    Bipolar disorder Mother    Irritable bowel syndrome Mother    Asthma Father    Diabetes Father    Colon cancer Father        dx at age 88   Hyperlipidemia Father    Hypertension Father    Migraines Sister    Bipolar disorder Brother    Other Neg Hx        pituitary disorder   Esophageal cancer Neg Hx     Social History   Socioeconomic History   Marital status: Married    Spouse name: John   Number of children: 0   Years of education: Not on file   Highest education level: GED or equivalent  Occupational History   Occupation: unemployed  Tobacco Use   Smoking status: Former    Current packs/day: 0.00    Average packs/day: 1 pack/day for 15.0 years (15.0 ttl pk-yrs)    Types: Cigarettes  Start date: 01/23/2001    Quit date: 01/24/2016    Years since  quitting: 7.3   Smokeless tobacco: Never  Vaping Use   Vaping status: Former  Substance and Sexual Activity   Alcohol use: No   Drug use: No   Sexual activity: Yes    Birth control/protection: None  Other Topics Concern   Not on file  Social History Narrative   Patient is right-handed. She lives with her husband in a 2 level home. She drinks I cup of coffee a day. She walks daily, and rides her recumbent bike QOD.   Social Drivers of Corporate investment banker Strain: Low Risk  (05/19/2023)   Overall Financial Resource Strain (CARDIA)    Difficulty of Paying Living Expenses: Not hard at all  Food Insecurity: No Food Insecurity (05/19/2023)   Hunger Vital Sign    Worried About Running Out of Food in the Last Year: Never true    Ran Out of Food in the Last Year: Never true  Transportation Needs: No Transportation Needs (05/19/2023)   PRAPARE - Administrator, Civil Service (Medical): No    Lack of Transportation (Non-Medical): No  Physical Activity: Insufficiently Active (05/19/2023)   Exercise Vital Sign    Days of Exercise per Week: 3 days    Minutes of Exercise per Session: 10 min  Stress: Stress Concern Present (05/19/2023)   [employer] of Occupational Health - Occupational Stress Questionnaire    Feeling of Stress : To some extent  Social Connections: Socially Isolated (05/19/2023)   Social Connection and Isolation Panel [NHANES]    Frequency of Communication with Friends and Family: Once a week    Frequency of Social Gatherings with Friends and Family: Never    Attends Religious Services: Never    Database administrator or Organizations: No    Attends Engineer, structural: Not on file    Marital Status: Married  Catering manager Violence: Not on file    Physical Exam: Vital signs in last 24 hours: @LMP  04/21/2023 (Exact Date)  GEN: NAD EYE: Sclerae anicteric ENT: MMM CV: Non-tachycardic Pulm: CTA b/l GI: Soft, NT/ND NEURO:  Alert &  Oriented x 3   Doristine Locks, DO [facility]   05/30/2023 1:06 PM

## 2023-05-30 NOTE — Progress Notes (Signed)
 Called to room to assist during endoscopic procedure.  Patient ID and intended procedure confirmed with present staff. Received instructions for my participation in the procedure from the performing physician.

## 2023-05-30 NOTE — Progress Notes (Signed)
 Report given to PACU, vss

## 2023-05-30 NOTE — Op Note (Signed)
 [facility] Patient Name: Amber [name] Procedure Date: 05/30/2023 1:34 PM MRN: [id number] Endoscopist: Doristine Locks , MD, [id number] Age: 43 Referring MD:  Date of Birth: [birth year] Gender: Female Account #: [id number] Procedure:                Colonoscopy Indications:              Screening in patient at increased risk: Colorectal                            cancer in father before age 47                           FHx n/f father with CRC diagnosed in his 51's, with                            recurrence of stage IV metastatic disease at age 72. Medicines:                Monitored Anesthesia Care Procedure:                Pre-Anesthesia Assessment:                           - Prior to the procedure, a History and Physical                            was performed, and patient medications and                            allergies were reviewed. The patient's tolerance of                            previous anesthesia was also reviewed. The risks                            and benefits of the procedure and the sedation                            options and risks were discussed with the patient.                            All questions were answered, and informed consent                            was obtained. Prior Anticoagulants: The patient has                            taken no anticoagulant or antiplatelet agents. ASA                            Grade Assessment: III - A patient with severe                            systemic disease. After reviewing the risks and  benefits, the patient was deemed in satisfactory                            condition to undergo the procedure.                           After obtaining informed consent, the colonoscope                            was passed under direct vision. Throughout the                            procedure, the patient's blood pressure, pulse, and                            oxygen  saturations were monitored continuously. The                            Olympus Scope SN: [id number] was introduced through                            the anus and advanced to the the terminal ileum.                            The colonoscopy was performed without difficulty.                            The patient tolerated the procedure well. The                            quality of the bowel preparation was good. The                            terminal ileum, ileocecal valve, appendiceal                            orifice, and rectum were photographed. Scope In: 1:50:24 PM Scope Out: 2:01:39 PM Scope Withdrawal Time: 0 hours 9 minutes 5 seconds  Total Procedure Duration: 0 hours 11 minutes 15 seconds  Findings:                 The perianal and digital rectal examinations were                            normal.                           The entire colon appeared normal.                           Non-bleeding internal hemorrhoids and hypertrophied                            anal papillae were found during retroflexion. The  hemorrhoids were small.                           The terminal ileum appeared normal. Complications:            No immediate complications. Estimated Blood Loss:     Estimated blood loss: none. Impression:               - The entire examined colon is normal.                           - Non-bleeding internal hemorrhoids.                           - The examined portion of the ileum was normal.                           - No specimens collected. Recommendation:           - Patient has a contact number available for                            emergencies. The signs and symptoms of potential                            delayed complications were discussed with the                            patient. Return to normal activities tomorrow.                            Written discharge instructions were provided to the                             patient.                           - Resume previous diet.                           - Continue present medications.                           - Repeat colonoscopy in 5 years for screening                            purposes due to family history.                           - Return to GI office PRN. Doristine Locks, MD 05/30/2023 2:10:00 PM

## 2023-05-30 NOTE — Progress Notes (Signed)
1332 Robinul 0.1 mg IV given due large amount of secretions upon assessment.  MD made aware, vss  

## 2023-05-30 NOTE — Patient Instructions (Signed)
 REPEAT Colonoscopy in 5 years for screening purposes due to family history.  YOU HAD AN ENDOSCOPIC PROCEDURE TODAY AT THE [facility]:   Refer to the procedure report that was given to you for any specific questions about what was found during the examination.  If the procedure report does not answer your questions, please call your gastroenterologist to clarify.  If you requested that your care partner not be given the details of your procedure findings, then the procedure report has been included in a sealed envelope for you to review at your convenience later.  YOU SHOULD EXPECT: Some feelings of bloating in the abdomen. Passage of more gas than usual.  Walking can help get rid of the air that was put into your GI tract during the procedure and reduce the bloating. If you had a lower endoscopy (such as a colonoscopy or flexible sigmoidoscopy) you may notice spotting of blood in your stool or on the toilet paper. If you underwent a bowel prep for your procedure, you may not have a normal bowel movement for a few days.  Please Note:  You might notice some irritation and congestion in your nose or some drainage.  This is from the oxygen used during your procedure.  There is no need for concern and it should clear up in a day or so.  SYMPTOMS TO REPORT IMMEDIATELY:  Following lower endoscopy (colonoscopy or flexible sigmoidoscopy):  Excessive amounts of blood in the stool  Significant tenderness or worsening of abdominal pains  Swelling of the abdomen that is new, acute  Fever of 100F or higher  Following upper endoscopy (EGD)  Vomiting of blood or coffee ground material  New chest pain or pain under the shoulder blades  Painful or persistently difficult swallowing  New shortness of breath  Fever of 100F or higher  Black, tarry-looking stools  For urgent or emergent issues, a gastroenterologist can be reached at any hour by calling [phone]. Do not use MyChart messaging  for urgent concerns.    DIET:  We do recommend a small meal at first, but then you may proceed to your regular diet.  Drink plenty of fluids but you should avoid alcoholic beverages for 24 hours.  ACTIVITY:  You should plan to take it easy for the rest of today and you should NOT DRIVE or use heavy machinery until tomorrow (because of the sedation medicines used during the test).    FOLLOW UP: Our staff will call the number listed on your records the next business day following your procedure.  We will call around 7:15- 8:00 am to check on you and address any questions or concerns that you may have regarding the information given to you following your procedure. If we do not reach you, we will leave a message.     If any biopsies were taken you will be contacted by phone or by letter within the next 1-3 weeks.  Please call us at [phone] if you have not heard about the biopsies in 3 weeks.    SIGNATURES/CONFIDENTIALITY: You and/or your care partner have signed paperwork which will be entered into your electronic medical record.  These signatures attest to the fact that that the information above on your After Visit Summary has been reviewed and is understood.  Full responsibility of the confidentiality of this discharge information lies with you and/or your care-partner.

## 2023-05-31 ENCOUNTER — Telehealth: Payer: Self-pay

## 2023-05-31 NOTE — Telephone Encounter (Signed)
 Left message on follow up call.

## 2023-05-31 NOTE — Progress Notes (Signed)
 Alpha gal (red meat allergy) and tryptase (mast cell disease marker) were both normal/negative.  Can reintroduce red meat.   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    Next step is skin testing.  Please schedule this and hold all antihistamines for 3 days prior

## 2023-06-02 LAB — SURGICAL PATHOLOGY

## 2023-06-06 ENCOUNTER — Telehealth: Payer: Self-pay

## 2023-06-06 NOTE — Telephone Encounter (Signed)
 Pt called stating Dr. Marlynn Perking wanted her to let her know when she did her colonoscopy and the results are in.

## 2023-06-07 ENCOUNTER — Other Ambulatory Visit: Payer: Self-pay | Admitting: Family Medicine

## 2023-06-07 ENCOUNTER — Encounter: Payer: Self-pay | Admitting: Internal Medicine

## 2023-06-07 NOTE — Telephone Encounter (Signed)
 I would let the surgeon know.  Normally we treat with higher strength topical steroids or prednisone but it can affect wound healing.  If the surgeon is okay with higher stregth topical steroids I can send one in.

## 2023-06-07 NOTE — Telephone Encounter (Signed)
 Looks like it was normal.  Thanks for letting me know

## 2023-06-08 ENCOUNTER — Encounter (HOSPITAL_BASED_OUTPATIENT_CLINIC_OR_DEPARTMENT_OTHER): Payer: Self-pay | Admitting: Emergency Medicine

## 2023-06-08 ENCOUNTER — Emergency Department (HOSPITAL_BASED_OUTPATIENT_CLINIC_OR_DEPARTMENT_OTHER)
Admission: EM | Admit: 2023-06-08 | Discharge: 2023-06-08 | Disposition: A | Discharge status: Home / Self Care | Attending: Emergency Medicine | Admitting: Emergency Medicine

## 2023-06-08 ENCOUNTER — Other Ambulatory Visit: Payer: Self-pay

## 2023-06-08 DIAGNOSIS — G43809 Other migraine, not intractable, without status migrainosus: Secondary | ICD-10-CM | POA: Insufficient documentation

## 2023-06-08 DIAGNOSIS — R519 Headache, unspecified: Secondary | ICD-10-CM | POA: Diagnosis present

## 2023-06-08 LAB — BASIC METABOLIC PANEL
Anion gap: 7 (ref 5–15)
BUN: 11 mg/dL (ref 6–20)
CO2: 22 mmol/L (ref 22–32)
Calcium: 9.9 mg/dL (ref 8.9–10.3)
Chloride: 109 mmol/L (ref 98–111)
Creatinine, Ser: 0.87 mg/dL (ref 0.44–1.00)
GFR, Estimated: >60 mL/min (ref >60)
Glucose, Bld: 90 mg/dL (ref 70–99)
Potassium: 3.6 mmol/L (ref 3.5–5.1)
Sodium: 138 mmol/L (ref 135–145)

## 2023-06-08 LAB — CBC WITH DIFFERENTIAL/PLATELET
Abs Immature Granulocytes: 0.03 10*3/uL (ref 0.00–0.07)
Basophils Absolute: 0 10*3/uL (ref 0.0–0.1)
Basophils Relative: 0 %
Eosinophils Absolute: 0.1 10*3/uL (ref 0.0–0.5)
Eosinophils Relative: 1 %
HCT: 36.7 % (ref 36.0–46.0)
Hemoglobin: 12.4 g/dL (ref 12.0–15.0)
Immature Granulocytes: 0 %
Lymphocytes Relative: 25 %
Lymphs Abs: 2.3 10*3/uL (ref 0.7–4.0)
MCH: 28.9 pg (ref 26.0–34.0)
MCHC: 33.8 g/dL (ref 30.0–36.0)
MCV: 85.5 fL (ref 80.0–100.0)
Monocytes Absolute: 0.5 10*3/uL (ref 0.1–1.0)
Monocytes Relative: 6 %
Neutro Abs: 6 10*3/uL (ref 1.7–7.7)
Neutrophils Relative %: 68 %
Platelets: 397 10*3/uL (ref 150–400)
RBC: 4.29 MIL/uL (ref 3.87–5.11)
RDW: 14.6 % (ref 11.5–15.5)
WBC: 9 10*3/uL (ref 4.0–10.5)
nRBC: 0 % (ref 0.0–0.2)

## 2023-06-08 MED ORDER — LACTATED RINGERS IV BOLUS
1000.0000 mL | Freq: Once | INTRAVENOUS | Status: AC
  Administered 2023-06-08: 1000 mL via INTRAVENOUS

## 2023-06-08 MED ORDER — PROCHLORPERAZINE EDISYLATE 10 MG/2ML IJ SOLN: 10.0000 mg | Freq: Once | INTRAMUSCULAR | Status: DC

## 2023-06-08 MED ORDER — DIPHENHYDRAMINE HCL 50 MG/ML IJ SOLN
50.0000 mg | Freq: Once | INTRAMUSCULAR | Status: AC
  Administered 2023-06-08: 50 mg via INTRAVENOUS
  Filled 2023-06-08: qty 1

## 2023-06-08 MED ORDER — DEXAMETHASONE SODIUM PHOSPHATE 10 MG/ML IJ SOLN
10.0000 mg | Freq: Once | INTRAMUSCULAR | Status: AC
  Administered 2023-06-08: 10 mg via INTRAVENOUS
  Filled 2023-06-08: qty 1

## 2023-06-08 MED ORDER — HYDROMORPHONE HCL 1 MG/ML IJ SOLN: 1.0000 mg | Freq: Once | INTRAMUSCULAR | Status: DC

## 2023-06-08 MED ORDER — METOCLOPRAMIDE HCL 5 MG/ML IJ SOLN
10.0000 mg | Freq: Once | INTRAMUSCULAR | Status: AC
  Administered 2023-06-08: 10 mg via INTRAVENOUS
  Filled 2023-06-08: qty 2

## 2023-06-08 MED ORDER — HYDROMORPHONE HCL 1 MG/ML IJ SOLN
0.5000 mg | Freq: Once | INTRAMUSCULAR | Status: AC
  Administered 2023-06-08: 0.5 mg via INTRAVENOUS
  Filled 2023-06-08: qty 1

## 2023-06-08 NOTE — ED Triage Notes (Signed)
 Pt reports hx of migraines, new onset migraine this AM, zyrtec is normal rescue med, not helping' reports n/v, denies other s/s

## 2023-06-08 NOTE — ED Notes (Signed)
 Pt. Reports migraine headache with no vomiting.  Pt. In no distress noted.  Pt. Reports she just needs some meds at this time.

## 2023-06-08 NOTE — ED Provider Notes (Signed)
 [place] [facility] Provider Note   CSN: [id number] Arrival date & time: 06/08/23  1704     History Chief Complaint  Patient presents with   Migraine    HPI Amber [name] is a 43 y.o. female presenting for chief complaint of migraine. States that she has chronic migraines.  Took her preventative medications well as abortive therapy without control.  States she has been having some more headaches lately and has a follow-up scheduled with her neurologist.  Patient's recorded medical, surgical, social, medication list and allergies were reviewed in the Snapshot window as part of the initial history.   Review of Systems   Review of Systems  Constitutional:  Negative for chills and fever.  HENT:  Negative for ear pain and sore throat.   Eyes:  Negative for pain and visual disturbance.  Respiratory:  Negative for cough and shortness of breath.   Cardiovascular:  Negative for chest pain and palpitations.  Gastrointestinal:  Positive for nausea and vomiting. Negative for abdominal pain.  Genitourinary:  Negative for dysuria and hematuria.  Musculoskeletal:  Negative for arthralgias and back pain.  Skin:  Negative for color change and rash.  Neurological:  Positive for headaches. Negative for seizures and syncope.  All other systems reviewed and are negative.   Physical Exam Updated Vital Signs BP 111/68   Pulse (!) 101   Temp 97.8 F (36.6 C)   Resp 18   Ht 5\' 6"  (1.676 m)   Wt 108 kg   LMP 05/20/2023 (Exact Date)   SpO2 98%   BMI 38.41 kg/m  Physical Exam Vitals and nursing note reviewed.  Constitutional:      General: She is not in acute distress.    Appearance: She is well-developed.  HENT:     Head: Normocephalic and atraumatic.  Eyes:     Conjunctiva/sclera: Conjunctivae normal.  Cardiovascular:     Rate and Rhythm: Normal rate and regular rhythm.     Heart sounds: No murmur heard. Pulmonary:     Effort: Pulmonary  effort is normal. No respiratory distress.     Breath sounds: Normal breath sounds.  Abdominal:     General: There is no distension.     Palpations: Abdomen is soft.     Tenderness: There is no abdominal tenderness. There is no right CVA tenderness or left CVA tenderness.  Musculoskeletal:        General: No swelling or tenderness. Normal range of motion.     Cervical back: Neck supple.  Skin:    General: Skin is warm and dry.  Neurological:     General: No focal deficit present.     Mental Status: She is alert and oriented to person, place, and time. Mental status is at baseline.     Cranial Nerves: No cranial nerve deficit.      ED Course/ Medical Decision Making/ A&P    Procedures Procedures   Medications Ordered in ED Medications  diphenhydrAMINE (BENADRYL) injection 50 mg (50 mg Intravenous Given 06/08/23 1745)  lactated ringers bolus 1,000 mL (0 mLs Intravenous Stopped 06/08/23 1920)  dexamethasone (DECADRON) injection 10 mg (10 mg Intravenous Given 06/08/23 1749)  HYDROmorphone (DILAUDID) injection 0.5 mg (0.5 mg Intravenous Given 06/08/23 1746)  metoCLOPramide (REGLAN) injection 10 mg (10 mg Intravenous Given 06/08/23 1747)   Medical Decision Making:   Amber [name] is a 43 y.o. female who presented to the ED today with recurrent headaches detailed above.    Patient  placed on continuous vitals and telemetry monitoring while in ED which was reviewed periodically.   Complete initial physical exam performed, notably the patient  was HDS in NAD.    Reviewed and confirmed nursing documentation for past medical history, family history, social history.    Initial Assessment:   With the patient's presentation of headache, most likely diagnosis is tension type headaches vs atypical migraines. Other diagnoses were considered including (but not limited to) intracranial mass, intracranial hemorrhage, intracranial infection including meningitis vs encephalitis, GCA, trigeminal  neuralgia. These are considered less likely due to history of present illness and physical exam findings.    This is most consistent with an acute life/limb threatening illness complicated by underlying chronic conditions.   Timeline and slow onset is not consistent with SAH/ICH   Age and description of pain is not consistent with GCA   Lack of fever,meningismus is not consistent with meningitis/encephalitis   Initial Plan:  Will initiate treatment with Compazine/Benardryll for treatment of nonspecific headache  Screening labs including CBC and Metabolic panel to evaluate for infectious or metabolic etiology of disease.  Considered CTH to evaluate for structural IC etiology.  However patient has had numerus evaluations for similar in the past frequently attributed to migraines.  Patient was in agreement with holding off on repeat imaging given consistency with the migraine disorder. Objective evaluation as below reviewed   Initial Study Results:   Laboratory  All laboratory results reviewed without evidence of clinically relevant pathology.    EKG EKG was reviewed independently. Rate, rhythm, axis, intervals all examined and without medically relevant abnormality. ST segments without concerns for elevations.     Final Assessment and Plan:   Patient's labs with no focal pathology.  Symptoms completely resolved on serial assessment. Patient stable for outpatient care and management.  Disposition:  I have considered need for hospitalization, however, considering all of the above, I believe this patient is stable for discharge at this time.  Patient/family educated about specific return precautions for given chief complaint and symptoms.  Patient/family educated about follow-up with PCP.     Patient/family expressed understanding of return precautions and need for follow-up. Patient spoken to regarding all imaging and laboratory results and appropriate follow up for these results. All  education provided in verbal form with additional information in written form. Time was allowed for answering of patient questions. Patient discharged.    Emergency Department Medication Summary:   Medications  diphenhydrAMINE (BENADRYL) injection 50 mg (50 mg Intravenous Given 06/08/23 1745)  lactated ringers bolus 1,000 mL (0 mLs Intravenous Stopped 06/08/23 1920)  dexamethasone (DECADRON) injection 10 mg (10 mg Intravenous Given 06/08/23 1749)  HYDROmorphone (DILAUDID) injection 0.5 mg (0.5 mg Intravenous Given 06/08/23 1746)  metoCLOPramide (REGLAN) injection 10 mg (10 mg Intravenous Given 06/08/23 1747)           Clinical Impression:  1. Other migraine without status migrainosus, not intractable      Discharge   Final Clinical Impression(s) / ED Diagnoses Final diagnoses:  Other migraine without status migrainosus, not intractable    Rx / DC Orders ED Discharge Orders     None         Glyn Ade, MD 06/08/23 1931

## 2023-06-09 ENCOUNTER — Other Ambulatory Visit: Payer: Self-pay | Admitting: Neurology

## 2023-06-09 ENCOUNTER — Other Ambulatory Visit: Payer: Self-pay

## 2023-06-09 ENCOUNTER — Emergency Department (HOSPITAL_BASED_OUTPATIENT_CLINIC_OR_DEPARTMENT_OTHER)
Admission: EM | Admit: 2023-06-09 | Discharge: 2023-06-09 | Disposition: A | Discharge status: Home / Self Care | Attending: Emergency Medicine | Admitting: Emergency Medicine

## 2023-06-09 DIAGNOSIS — T782XXA Anaphylactic shock, unspecified, initial encounter: Secondary | ICD-10-CM | POA: Diagnosis not present

## 2023-06-09 DIAGNOSIS — R070 Pain in throat: Secondary | ICD-10-CM | POA: Diagnosis present

## 2023-06-09 LAB — CBC WITH DIFFERENTIAL/PLATELET
Abs Immature Granulocytes: 0.04 10*3/uL (ref 0.00–0.07)
Basophils Absolute: 0.1 10*3/uL (ref 0.0–0.1)
Basophils Relative: 1 %
Eosinophils Absolute: 0.1 10*3/uL (ref 0.0–0.5)
Eosinophils Relative: 1 %
HCT: 38.2 % (ref 36.0–46.0)
Hemoglobin: 13.1 g/dL (ref 12.0–15.0)
Immature Granulocytes: 0 %
Lymphocytes Relative: 37 %
Lymphs Abs: 4.8 10*3/uL — ABNORMAL HIGH (ref 0.7–4.0)
MCH: 29.1 pg (ref 26.0–34.0)
MCHC: 34.3 g/dL (ref 30.0–36.0)
MCV: 84.9 fL (ref 80.0–100.0)
Monocytes Absolute: 0.8 10*3/uL (ref 0.1–1.0)
Monocytes Relative: 6 %
Neutro Abs: 7.1 10*3/uL (ref 1.7–7.7)
Neutrophils Relative %: 55 %
Platelets: 427 10*3/uL — ABNORMAL HIGH (ref 150–400)
RBC: 4.5 MIL/uL (ref 3.87–5.11)
RDW: 14.3 % (ref 11.5–15.5)
WBC: 12.8 10*3/uL — ABNORMAL HIGH (ref 4.0–10.5)
nRBC: 0 % (ref 0.0–0.2)

## 2023-06-09 LAB — BASIC METABOLIC PANEL
Anion gap: 12 (ref 5–15)
BUN: 13 mg/dL (ref 6–20)
CO2: 18 mmol/L — ABNORMAL LOW (ref 22–32)
Calcium: 10 mg/dL (ref 8.9–10.3)
Chloride: 106 mmol/L (ref 98–111)
Creatinine, Ser: 1.08 mg/dL — ABNORMAL HIGH (ref 0.44–1.00)
GFR, Estimated: >60 mL/min (ref >60)
Glucose, Bld: 151 mg/dL — ABNORMAL HIGH (ref 70–99)
Potassium: 3.2 mmol/L — ABNORMAL LOW (ref 3.5–5.1)
Sodium: 136 mmol/L (ref 135–145)

## 2023-06-09 MED ORDER — DIPHENHYDRAMINE HCL 50 MG/ML IJ SOLN
25.0000 mg | Freq: Once | INTRAMUSCULAR | Status: AC
  Administered 2023-06-09: 25 mg via INTRAVENOUS
  Filled 2023-06-09: qty 1

## 2023-06-09 MED ORDER — FAMOTIDINE IN NACL 20-0.9 MG/50ML-% IV SOLN
20.0000 mg | Freq: Once | INTRAVENOUS | Status: AC
  Administered 2023-06-09: 20 mg via INTRAVENOUS
  Filled 2023-06-09: qty 50

## 2023-06-09 MED ORDER — ONDANSETRON HCL 4 MG/2ML IJ SOLN
4.0000 mg | Freq: Once | INTRAMUSCULAR | Status: AC
  Administered 2023-06-09: 4 mg via INTRAVENOUS
  Filled 2023-06-09: qty 2

## 2023-06-09 MED ORDER — METHYLPREDNISOLONE SODIUM SUCC 125 MG IJ SOLR
125.0000 mg | Freq: Once | INTRAMUSCULAR | Status: AC
  Administered 2023-06-09: 125 mg via INTRAVENOUS
  Filled 2023-06-09: qty 2

## 2023-06-09 NOTE — ED Triage Notes (Signed)
 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 Ate the salmon at 2000, began having scratchy throat, difficulty swallowing, itching, nausea. Administered epi pen at 2035. At this time pt throat is feeling swollen and she is coughing. Pt able to talk with no difficulty at this time.

## 2023-06-09 NOTE — Progress Notes (Signed)
 NEUROLOGY FOLLOW UP OFFICE NOTE  Amber [name] [id number]  Assessment/Plan:   1.  Hemiplegic migraine, intractable 2.  Cluster headache, infrequent 3.  Prolactinoma    1.  Migraine prevention:  Change from Emgality to Aimovig every 21 days; continue topiramate 100mg  at bedtime 3.  Migraine Rescue:  Nurtec first line, tramadol second line; Zofran for nausea.  For cluster headaches, tramadol. 4.  Limit use of pain relievers to no more than 2 days out of week to prevent risk of rebound or medication-overuse headache. 5.  Keep headache diary 6.  Follow up in 6 months.  Subjective:  Amber [name] is a 43 year old female with prolactinoma and anxiety with depression who follows up for migraine.   UPDATE: Restarted topiramate. Migraines:  Improved.  Intensity mod-severe, duration within 2 hours with Nurtec, frequency 2-3 a week.  Needed to go to the ED last Thursday for a migraine headache cocktail.     Cluster headache:  none  She has had a lot going on since last visit.  She had started on [facility] for diabetes.  She lost 60 lbs and Hgb A1c has improved.  She underwent left ulnar transposition.  She started having anaphylaxis to certain foods such as salmon.  Concern for MCAS.  She also has an appointment with cardiology to evaluate POTS.  She has been orthostatic and exhibiting episodes of tachycardia.  At night in bed, sometimes her toes curl.  She also reports brain fog.    Rescue protocol:  Nurtec, tramadol, Zofran 8mg  Frequency of abortive: 2 to 3 days a week Rescue protocol:  Zofran, Nurtec (tramadol second line) Current NSAIDS:  no Current analgesics:  tramadol Current triptans:  no Current ergot:  Dostinex (for hyperprolactinemia) Current anti-emetic:  Zofran 8mg  Current muscle relaxants:  tizanidine 4mg  PRN muscle spams Current anti-anxiolytic:  hydroxyzine Current sleep aide:  quetiapine 50mg  Current Antihypertensive medications:  no Current Antidepressant  medications:  Lexapro 10mg  Current Anticonvulsant medications:  topiramate 100mg  at bedtime, gabapentin 300mg  at bedtime Current CGRP inhibitor:  Emgality every 21 days, Nurtec (rescue) Current Vitamins/Herbal/Supplements:  magnesium oxide 400mg , riboflavin 400mg  Current Antihistamines/Decongestants:  Benadryl Other therapy:  no   Caffeine:  1 cup coffee rarely Alcohol:  no Smoker:  no Diet:  60 oz water daily.  Little fast food.  No soda.  Eats chicken and fish.  Not skipping meals.   Exercise:  no Depression/anxiety:  yes Other pain: none Sleep hygiene:  Improved with quetiapine   Prolactin level still elevated, however  Workup is ongoing.  She is undergoing cardiac evaluation as well.   HISTORY: Migraines Onset:  Childhood Location:  Left sided Quality:  throbbing Initial intensity:  severe Aura:  no Prodrome:  Feels ill for 3 days prior Postdrome:  "hangover" effect for 1 day after Associated symptoms:  Left sided numbness of face, arm and leg with slight weakness of left arm and leg.  Nausea, photophobia, and phonophobia.  She has not had any new worse headache of her life, waking up from sleep Initial Duration:  Several hours to several days.  Over the summer, she had status migrainosis lasting 13-14 weeks. Initial Frequency:  3 to 4 days a week Initial Frequency of abortive medication: 3 to 4 days a week Triggers/aggravating factors:  Sleep deprivation, shifting positions Relieving factors:  Laying down in dark and quiet room Activity:  Aggravates.   Past NSAIDS:  Ibuprofen.  Cannot take NSAIDs due to GI bleed Past analgesics:  Excedrin Past  abortive triptans/ergot:  Sumatriptan (increased headache and caused joint pain).  CONTRAINDICATED (Hemiplegic migraine) Past muscle relaxants:  no Past anti-emetic:  Reglan (contraindicated with cabergoline), Zofran 4mg , Promethazine Past antihypertensive medications:  Propranolol 80mg  (hypotension), metoprolol Past antidepressant  medications:  Prozac; venlafaxine XR 150mg  daily Past anticonvulsant medications: none Past CGRP inhibitors:  Bernita Raisin Past vitamins/Herbal/Supplements:  no Past antihistamines/decongestants:  no Other past therapies:  Reyvow Unable to afford Botox with her insurance plan.    Cluster Headache: Right orbital, stabbing, severe, associated with ptosis and conjunctival injection, lasting several hours and occurring 1 to 2 times a month.   Family history of headache:  mom   MRI of brain with and without contrast from 05/25/16 was personally reviewed and revealed 6 x 9 mm hypoenhancing pituitary microadenoma without compression of the optic chiasm. MRI of pituitary with and without contrast from 08/31/2019 showed regression of the microadenoma since 2018.   PAST MEDICAL HISTORY: Past Medical History:  Diagnosis Date   Anxiety    Asthma    Depression    Diabetes (HCC)    GERD (gastroesophageal reflux disease)    History of chicken pox    History of migraine headaches    Migraines    Pituitary adenoma (HCC)    Seizures (HCC)     MEDICATIONS: Current Outpatient Medications on File Prior to Visit  Medication Sig Dispense Refill   acetaminophen (TYLENOL) 500 MG tablet Take 500 mg by mouth as needed.     cabergoline (DOSTINEX) 0.5 MG tablet 2.5 mg weekly (Two tabs on Mondays, 1 tab on wednesdays and 2 tabs on Fridays) 65 tablet 3   cetirizine (ZYRTEC) 10 MG chewable tablet Chew 10 mg by mouth in the morning and at bedtime.     Continuous Glucose Sensor (DEXCOM G7 SENSOR) MISC USE 1 SENSOR TOPICALLY EVERY 10 DAYS 1 each 11   EPINEPHrine (EPIPEN 2-PAK) 0.3 mg/0.3 mL IJ SOAJ injection Inject 0.3 mg into the muscle as needed for anaphylaxis. 1 each 2   escitalopram (LEXAPRO) 10 MG tablet Take 1 tablet (10 mg total) by mouth daily. 90 tablet 3   famotidine (PEPCID) 20 MG tablet Take 20 mg by mouth 2 (two) times daily.     fenofibrate (TRICOR) 145 MG tablet Take 1 tablet (145 mg total) by mouth  daily. 90 tablet 3   folic acid (FOLVITE) 1 MG tablet Take 1 tablet (1 mg total) by mouth daily. 30 tablet 6   gabapentin (NEURONTIN) 300 MG capsule Take 1 capsule by mouth at bedtime.     Galcanezumab-gnlm (EMGALITY) 120 MG/ML SOAJ Inject 120 mg into the skin every 21 ( twenty-one) days. 1.12 mL 11   glucose blood (ACCU-CHEK GUIDE) test strip 1 each by Other route daily in the afternoon. Use as instructed 100 each 3   ipratropium (ATROVENT) 0.06 % nasal spray Place 2 sprays into both nostrils 4 (four) times daily. 15 mL 12   ondansetron (ZOFRAN) 8 MG tablet Take 1 tablet (8 mg total) by mouth as needed for nausea or vomiting. 60 tablet 1   pantoprazole (PROTONIX) 40 MG tablet Take 1 tablet (40 mg total) by mouth 2 (two) times daily. 180 tablet 3   QUEtiapine (SEROQUEL) 300 MG tablet Take 1 tablet (300 mg total) by mouth at bedtime. 90 tablet 3   Riboflavin 400 MG CAPS Take 400 mg by mouth daily.     rosuvastatin (CRESTOR) 20 MG tablet Take 1 tablet (20 mg total) by mouth daily. 90 tablet 3  tirzepatide (MOUNJARO) 15 MG/0.5ML Pen Inject 15 mg into the skin once a week. 2 mL 2   Current Facility-Administered Medications on File Prior to Visit  Medication Dose Route Frequency Provider Last Rate Last Admin   0.9 %  sodium chloride infusion  500 mL Intravenous Once Cirigliano, Vito V, DO         ALLERGIES: Allergies  Allergen Reactions   Bee Venom Anaphylaxis   Chlorhexidine Hives, Itching and Rash   Ibuprofen Other (See Comments)    GI bleed   Iodine Hives, Itching and Rash   Peanut-Containing Drug Products Other (See Comments)    Pt reports unknown   Sesame Seed (Diagnostic) Anaphylaxis   Shellfish Allergy Shortness Of Breath, Swelling and Other (See Comments)    Tingling in the lips, mouth and throat   Soybean-Containing Drug Products Anaphylaxis   Sulfa Antibiotics Anaphylaxis   Triptans Other (See Comments)    Makes pain worse. Pain in joints   Lunesta [Eszopiclone] Nausea And  Vomiting    GI issues   Almond (Diagnostic) Other (See Comments)    Unknown per pt   Aspirin Other (See Comments)    UNKNOWN REACTION. Her parents and siblings are allergic to Aspirin.   Cashew Nut (Anacardium Occidentale) Skin Test Other (See Comments)    Unknown per pt   Hazelnut (Filbert) Other (See Comments)    Unknown per pt   Nsaids Nausea And Vomiting   Tape Rash    Blisters    Walnut Other (See Comments)    Unknown per pt   Wheat Nausea And Vomiting    FAMILY HISTORY: Family History  Problem Relation Age of Onset   Asthma Mother    Diabetes Mother    Migraines Mother    Hyperlipidemia Mother    Bipolar disorder Mother    Irritable bowel syndrome Mother    Asthma Father    Diabetes Father    Colon cancer Father        dx at age 22   Hyperlipidemia Father    Hypertension Father    Migraines Sister    Bipolar disorder Brother    Other Neg Hx        pituitary disorder   Esophageal cancer Neg Hx       Objective:  Blood pressure 93/63, pulse (!) 103, resp. rate 18, weight 230 lb (104.3 kg), last menstrual period 05/20/2023, SpO2 99%. General: No acute distress.  Patient appears well-groomed.   Head:  Normocephalic/atraumatic Eyes:  Fundi examined but not visualized Neck: supple, no paraspinal tenderness, full range of motion Heart:  Regular rate and rhythm Neurological Exam: alert and oriented.  Speech fluent and not dysarthric, language intact.  CN II-XII intact. Bulk and tone normal, muscle strength 5/5 throughout.  Sensation to light touch intact.  Deep tendon reflexes 2+ throughout, toes downgoing.  Finger to nose testing intact.  Gait normal, Romberg negative.   Shon Millet, DO  CC: Arva Chafe, DO

## 2023-06-09 NOTE — ED Provider Notes (Signed)
 [place] [facility] Provider Note   CSN: [id number] Arrival date & time: 06/09/23  2058     History No chief complaint on file.   HPI Amber [name] is a 43 y.o. female presenting for allergic reaction. HX of Shrimp and cod reaction  835 EPIpen- for tightness in throat and nausea.  Tried to take zyrtec PO unsuccessfully.   Patient's recorded medical, surgical, social, medication list and allergies were reviewed in the Snapshot window as part of the initial history.   Review of Systems   Review of Systems  Constitutional:  Negative for chills and fever.  HENT:  Positive for trouble swallowing. Negative for ear pain and sore throat.   Eyes:  Negative for pain and visual disturbance.  Respiratory:  Positive for shortness of breath and wheezing. Negative for cough.   Cardiovascular:  Negative for chest pain and palpitations.  Gastrointestinal:  Negative for abdominal pain and vomiting.  Genitourinary:  Negative for dysuria and hematuria.  Musculoskeletal:  Negative for arthralgias and back pain.  Skin:  Negative for color change and rash.  Neurological:  Negative for seizures and syncope.  All other systems reviewed and are negative.   Physical Exam Updated Vital Signs BP 98/74   Pulse 80   Temp 98 F (36.7 C)   Resp 18   Ht 5\' 6"  (1.676 m)   Wt 105.7 kg   LMP 05/20/2023 (Exact Date)   SpO2 97%   BMI 37.61 kg/m  Physical Exam Vitals and nursing note reviewed.  Constitutional:      General: She is not in acute distress.    Appearance: She is well-developed.  HENT:     Head: Normocephalic and atraumatic.  Eyes:     Conjunctiva/sclera: Conjunctivae normal.  Cardiovascular:     Rate and Rhythm: Normal rate and regular rhythm.     Heart sounds: No murmur heard. Pulmonary:     Effort: Pulmonary effort is normal. No respiratory distress.     Breath sounds: Normal breath sounds.  Abdominal:     General: There is no  distension.     Palpations: Abdomen is soft.     Tenderness: There is no abdominal tenderness. There is no right CVA tenderness or left CVA tenderness.  Musculoskeletal:        General: No swelling or tenderness. Normal range of motion.     Cervical back: Neck supple.  Skin:    General: Skin is warm and dry.  Neurological:     General: No focal deficit present.     Mental Status: She is alert and oriented to person, place, and time. Mental status is at baseline.     Cranial Nerves: No cranial nerve deficit.      ED Course/ Medical Decision Making/ A&P    Procedures Procedures   Medications Ordered in ED Medications  diphenhydrAMINE (BENADRYL) injection 25 mg (25 mg Intravenous Given 06/09/23 2119)  methylPREDNISolone sodium succinate (SOLU-MEDROL) 125 mg/2 mL injection 125 mg (125 mg Intravenous Given 06/09/23 2120)  ondansetron (ZOFRAN) injection 4 mg (4 mg Intravenous Given 06/09/23 2126)  diphenhydrAMINE (BENADRYL) injection 25 mg (25 mg Intravenous Given 06/09/23 2156)  famotidine (PEPCID) IVPB 20 mg premix (20 mg Intravenous New Bag/Given 06/09/23 2159)    Medical Decision Making:   43 year old female with a chief complaint of anaphylactic reaction.  Took her an epinephrine significantly improved. States that she has epi at home multiple containers with her due to history of recurrent anaphylactic  syndrome. Treated with Solu-Medrol, Benadryl and grossly improved.  Observed for 120 minutes and stabilized.  Patient feels comfortable outpatient care management without any further acute intervention.  Disposition:  I have considered need for hospitalization, however, considering all of the above, I believe this patient is stable for discharge at this time.  Patient/family educated about specific return precautions for given chief complaint and symptoms.  Patient/family educated about follow-up with PCP.     Patient/family expressed understanding of return precautions and need for  follow-up. Patient spoken to regarding all imaging and laboratory results and appropriate follow up for these results. All education provided in verbal form with additional information in written form. Time was allowed for answering of patient questions. Patient discharged.    Emergency Department Medication Summary:   Medications  diphenhydrAMINE (BENADRYL) injection 25 mg (25 mg Intravenous Given 06/09/23 2119)  methylPREDNISolone sodium succinate (SOLU-MEDROL) 125 mg/2 mL injection 125 mg (125 mg Intravenous Given 06/09/23 2120)  ondansetron (ZOFRAN) injection 4 mg (4 mg Intravenous Given 06/09/23 2126)  diphenhydrAMINE (BENADRYL) injection 25 mg (25 mg Intravenous Given 06/09/23 2156)  famotidine (PEPCID) IVPB 20 mg premix (20 mg Intravenous New Bag/Given 06/09/23 2159)        Clinical Impression:  1. Anaphylaxis, initial encounter      Discharge   Final Clinical Impression(s) / ED Diagnoses Final diagnoses:  Anaphylaxis, initial encounter    Rx / DC Orders ED Discharge Orders     None         Glyn Ade, MD 06/09/23 2316

## 2023-06-10 ENCOUNTER — Other Ambulatory Visit: Payer: Self-pay | Admitting: Neurology

## 2023-06-12 ENCOUNTER — Ambulatory Visit (INDEPENDENT_AMBULATORY_CARE_PROVIDER_SITE_OTHER): Admitting: Neurology

## 2023-06-12 ENCOUNTER — Ambulatory Visit: Attending: Cardiology | Admitting: Cardiology

## 2023-06-12 ENCOUNTER — Encounter: Payer: Self-pay | Admitting: Neurology

## 2023-06-12 ENCOUNTER — Encounter: Payer: Self-pay | Admitting: Cardiology

## 2023-06-12 ENCOUNTER — Other Ambulatory Visit: Payer: Self-pay | Admitting: Neurology

## 2023-06-12 VITALS — BP 92/68 | HR 90 | Resp 16 | Ht 66.0 in | Wt 229.0 lb

## 2023-06-12 VITALS — BP 93/63 | HR 103 | Resp 18 | Wt 230.0 lb

## 2023-06-12 DIAGNOSIS — I959 Hypotension, unspecified: Secondary | ICD-10-CM

## 2023-06-12 DIAGNOSIS — G43409 Hemiplegic migraine, not intractable, without status migrainosus: Secondary | ICD-10-CM | POA: Diagnosis not present

## 2023-06-12 DIAGNOSIS — R000 Tachycardia, unspecified: Secondary | ICD-10-CM | POA: Diagnosis not present

## 2023-06-12 DIAGNOSIS — R002 Palpitations: Secondary | ICD-10-CM

## 2023-06-12 DIAGNOSIS — G44019 Episodic cluster headache, not intractable: Secondary | ICD-10-CM

## 2023-06-12 DIAGNOSIS — D352 Benign neoplasm of pituitary gland: Secondary | ICD-10-CM

## 2023-06-12 DIAGNOSIS — E1165 Type 2 diabetes mellitus with hyperglycemia: Secondary | ICD-10-CM | POA: Diagnosis not present

## 2023-06-12 MED ORDER — AIMOVIG 140 MG/ML ~~LOC~~ SOAJ: 140.0000 mg | SUBCUTANEOUS | 5 refills | Status: DC

## 2023-06-12 MED ORDER — TOPIRAMATE 100 MG PO TABS: 100.0000 mg | ORAL_TABLET | Freq: Every day | ORAL | 5 refills | Status: DC

## 2023-06-12 MED ORDER — TRAMADOL HCL 50 MG PO TABS: 50.0000 mg | ORAL_TABLET | Freq: Four times a day (QID) | ORAL | 2 refills | Status: DC | PRN

## 2023-06-12 MED ORDER — NURTEC 75 MG PO TBDP: 75.0000 mg | ORAL_TABLET | ORAL | 5 refills | Status: DC | PRN

## 2023-06-12 NOTE — Progress Notes (Signed)
 Cardiology Office Note:  .   Date:  06/12/2023  ID:  Amber [name], DOB 1980/06/04, MRN [id number] PCP:  Sharlene Dory, DO  Former Cardiology Providers: Dr. Milinda [facility] Providers Cardiologist:  None , [facility] (established care 06/12/23) Electrophysiologist:  None  Click to update primary MD,subspecialty MD or APP then REFRESH:1}    Chief Complaint  Patient presents with   Tachycardia   Follow-up   Dizziness    History of Present Illness: .   Amber [name] is a 43 y.o. Caucasian female whose past medical history and cardiovascular risk factors includes: Hepatic steatosis, migraines, pituitary adenoma, asthma, obesity, diabetes, former smoker (quit in 2017).  Formally under the care of Dr. Vern Claude reddy Madireddy who last saw Amber [name] back in 02/2023. I am seeing her for the first time to re-establishing care.   Given her history of palpitations and her visits patient did undergo a cardiac monitor.  Triggered events were associated with sinus tachycardia versus atrial tach.  Patient was recommended to avoid stimulants, keeping herself well-hydrated, eating 3 heart healthy meals, started on Lopressor 25 mg p.o. twice daily.  She was on the monitor her symptoms with her iWatch.  She presents today for follow-up.  Reviewed the results of the echocardiogram with her in detail and noted below for further reference.  Patient states that based on her symptoms and her extensive research " I have POTS."  Patient states that she has tried conservative management with regards to increasing hydration, changing positions slowly, and considering beta-blocker therapy as directed by her former cardiologist.  She has been reluctant to be on beta-blockers due to her soft blood pressures at baseline.  Patient states that she has episodes of tachycardia upon standing with a heart rate increasing by 30 to 50 bpm.  She utilizes an iWatch as well as visible app to  monitor her heart rate variability.  Patient states that she has had 3 syncopal events the last episode being in February 2025.  Patient states that she was in her usual state of health sitting on a sofa, got up to go to the kitchen and on her way started experiencing dizziness/tunnel vision/felt legs getting weak/fullness in her ear/and feeling warm.  Patient states that she lost consciousness for approximately 30 to 45 seconds.  She is currently in the process of training a service dog who alerted her husband who came to assist her.  Patient had not lost bowel or bladder function.  Patient is currently not on antihypertensive medications and her blood pressures are soft.  She has undergone extensive cardiovascular workup as outlined above.  Patient also sees neurology for migraine and is on multiple medications for treatment/management.  Other medications include Lexapro for OCD, gabapentin for cubital tunnel syndrome, Seroquel for insomnia.  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   Review of Systems: .   Review of Systems  Cardiovascular:  Negative for chest pain, claudication, irregular heartbeat, leg swelling, near-syncope, orthopnea, palpitations, paroxysmal nocturnal dyspnea and syncope.  Respiratory:  Negative for shortness of breath.   Hematologic/Lymphatic: Negative for bleeding problem.    Studies Reviewed:   EKG: EKG Interpretation Date/Time:  Monday June 12 2023 15:36:10 EDT Ventricular Rate:  89 PR Interval:  178 QRS Duration:  76 QT Interval:  360 QTC Calculation: 438 R Axis:   43  Text Interpretation: Normal sinus rhythm Normal ECG When compared with ECG of 08-Jun-2023 17:36, No significant change since last  tracing Confirmed by Tessa Lerner [phone]) on 06/12/2023 3:37:50 PM  Echocardiogram: 04/13/2023 LVEF 60 to 65%, normal diastolic function, right ventricular size and function normal, no significant valvular heart  disease  CCTA 10/31/2019 1.  Normal origin and course of the coronary arteries.   2.  No evidence of CAD, CADRADS = 0.  Coronary calcium score is 0.  3.No acute findings in the imaged extracardiac chest.  Cardiac monitor: 02/2023 HR 37-147, average 86 bpm. No atrial fibrillation detected. Rare supraventricular ectopy. Rare ventricular ectopy. No sustained arrhythmias. Symptom trigger episodes correspond to sinus rhythm and sinus tachycardia  vs atrial tachycardia    RADIOLOGY: NA  Risk Assessment/Calculations:   NA   Labs:       Latest Ref Rng & Units 06/09/2023    9:11 PM 06/08/2023    5:16 PM 05/22/2023    2:42 PM  CBC  WBC 4.0 - 10.5 K/uL 12.8  9.0  10.4   Hemoglobin 12.0 - 15.0 g/dL 60.4  54.0  98.1   Hematocrit 36.0 - 46.0 % 38.2  36.7  38.3   Platelets 150 - 400 K/uL 427  397  427        Latest Ref Rng & Units 06/09/2023    9:11 PM 06/08/2023    5:16 PM 05/22/2023    2:42 PM  BMP  Glucose 70 - 99 mg/dL 191  90  82   BUN 6 - 20 mg/dL 13  11  11    Creatinine 0.44 - 1.00 mg/dL 4.78  2.95  6.21   Sodium 135 - 145 mmol/L 136  138  140   Potassium 3.5 - 5.1 mmol/L 3.2  3.6  3.5   Chloride 98 - 111 mmol/L 106  109  107   CO2 22 - 32 mmol/L 18  22  23    Calcium 8.9 - 10.3 mg/dL 30.8  9.9  65.7       Latest Ref Rng & Units 06/09/2023    9:11 PM 06/08/2023    5:16 PM 05/22/2023    2:42 PM  CMP  Glucose 70 - 99 mg/dL 846  90  82   BUN 6 - 20 mg/dL 13  11  11    Creatinine 0.44 - 1.00 mg/dL 9.62  9.52  8.41   Sodium 135 - 145 mmol/L 136  138  140   Potassium 3.5 - 5.1 mmol/L 3.2  3.6  3.5   Chloride 98 - 111 mmol/L 106  109  107   CO2 22 - 32 mmol/L 18  22  23    Calcium 8.9 - 10.3 mg/dL 32.4  9.9  40.1   Total Protein 6.5 - 8.1 g/dL   7.7   Total Bilirubin 0.0 - 1.2 mg/dL   0.6   Alkaline Phos 38 - 126 U/L   30   AST 15 - 41 U/L   22   ALT 0 - 44 U/L   23     Lab Results  Component Value Date   CHOL 94 02/14/2023   HDL 28.30 (L) 02/14/2023   LDLCALC 31  02/14/2023   LDLDIRECT 77.0 10/07/2020   TRIG 173.0 (H) 02/14/2023   CHOLHDL 3 02/14/2023   No results for input(s): "LIPOA" in the last 8760 hours. No components found for: "NTPROBNP" No results for input(s): "PROBNP" in the last 8760 hours. Recent Labs    11/30/22 1610 01/06/23 1027  TSH 0.48 0.87    Physical Exam:    Today's Vitals  06/12/23 1532  BP: 92/68  Pulse: 90  Resp: 16  SpO2: 94%  Weight: 229 lb (103.9 kg)  Height: 5\' 6"  (1.676 m)   Body mass index is 36.96 kg/m. Wt Readings from Last 3 Encounters:  06/12/23 229 lb (103.9 kg)  06/12/23 230 lb (104.3 kg)  06/09/23 233 lb (105.7 kg)    Orthostatic VS for the past 72 hrs (Last 3 readings):  Orthostatic BP Patient Position BP Location Cuff Size Orthostatic Pulse  06/12/23 1550 90/58 Standing Right Arm Large 104  06/12/23 1544 95/71 Sitting Right Arm Large 94  06/12/23 1542 104/67 Supine Right Arm Large 85   Physical Exam  Constitutional: No distress.  hemodynamically stable  Neck: No JVD present.  Cardiovascular: Normal rate, regular rhythm, S1 normal and S2 normal. Exam reveals no gallop, no S3 and no S4.  No murmur heard. Pulses:      Dorsalis pedis pulses are 2+ on the right side and 2+ on the left side.       Posterior tibial pulses are 2+ on the right side and 2+ on the left side.  Pulmonary/Chest: Effort normal and breath sounds normal. No stridor. She has no wheezes. She has no rales.  Musculoskeletal:        General: No edema.     Cervical back: Neck supple.  Skin: Skin is warm and moist.    Impression & Recommendation(s):  Impression:   ICD-10-CM   1. Palpitations  R00.2 EKG 12-Lead    2. Hypotension, unspecified hypotension type  I95.9     3. Tachycardia  R00.0     4. Type 2 diabetes mellitus with hyperglycemia, without long-term current use of insulin (HCC)  E11.65     5. Pituitary microadenoma (HCC)  D35.2        Recommendation(s):  Palpitations Tachycardia Has been  evaluated by my partner in the past underwent echocardiogram, coronary CTA, and a cardiac monitor. Her episodes of palpitation and tachycardia are chronic and stable; however, no unifying diagnoses. Patient suspects that she has POTS but has not been formally diagnosed. Orthostatic vital signs are negative and no postural changes to suggest POTS for now. Will refer her to Dr. Graciela Husbands from electrophysiology for further evaluation management otherwise recommend following up with [facility] for further guidance.  Hypotension, unspecified hypotension type I suspect a component of her hypertension is secondary to polypharmacy given multiple medications for migraines, also on medications for insomnia/OCD/neuropathic pain. Patient has not spoken to her prescribing physician with regards to her symptoms, I have greatly encouraged her to do so as there may be drug drug interactions that could be contributory. Would recommend holding off on Lopressor at this time due to concerns for symptomatic hypotension  Syncope: States that she has had 3 episodes.  Last episode in February 2022. Based on symptoms assessment secondary to vasovagal/orthostatic Has undergone appropriate cardiovascular workup including echo, coronary CTA, and a cardiac monitor in the recent past. Will refer her to electrophysiology for further guidance and recommendations. She is aware of [place] driving laws to stop driving after an episode of loss of consciousness until 6 months event-free.  Pituitary microadenoma (HCC) Currently on cabergoline. Follows with endocrinology and neurology   Orders Placed:  Orders Placed This Encounter  Procedures   EKG 12-Lead     Final Medication List:   No orders of the defined types were placed in this encounter.   There are no discontinued medications.   Current Outpatient Medications:  acetaminophen (TYLENOL) 500 MG tablet, Take 500 mg by mouth as needed., Disp: ,  Rfl:    cabergoline (DOSTINEX) 0.5 MG tablet, 2.5 mg weekly (Two tabs on Mondays, 1 tab on wednesdays and 2 tabs on Fridays), Disp: 65 tablet, Rfl: 3   cetirizine (ZYRTEC) 10 MG chewable tablet, Chew 10 mg by mouth in the morning and at bedtime., Disp: , Rfl:    Continuous Glucose Sensor (DEXCOM G7 SENSOR) MISC, USE 1 SENSOR TOPICALLY EVERY 10 DAYS, Disp: 1 each, Rfl: 11   EPINEPHrine (EPIPEN 2-PAK) 0.3 mg/0.3 mL IJ SOAJ injection, Inject 0.3 mg into the muscle as needed for anaphylaxis., Disp: 1 each, Rfl: 2   Erenumab-aooe (AIMOVIG) 140 MG/ML SOAJ, Inject 140 mg into the skin every 21 ( twenty-one) days., Disp: 1.12 mL, Rfl: 5   escitalopram (LEXAPRO) 10 MG tablet, Take 1 tablet (10 mg total) by mouth daily., Disp: 90 tablet, Rfl: 3   famotidine (PEPCID) 20 MG tablet, Take 20 mg by mouth 2 (two) times daily., Disp: , Rfl:    fenofibrate (TRICOR) 145 MG tablet, Take 1 tablet (145 mg total) by mouth daily., Disp: 90 tablet, Rfl: 3   folic acid (FOLVITE) 1 MG tablet, Take 1 tablet (1 mg total) by mouth daily., Disp: 30 tablet, Rfl: 6   gabapentin (NEURONTIN) 300 MG capsule, Take 1 capsule by mouth at bedtime., Disp: , Rfl:    Galcanezumab-gnlm (EMGALITY) 120 MG/ML SOAJ, Inject 120 mg into the skin every 21 ( twenty-one) days., Disp: 1.12 mL, Rfl: 11   glucose blood (ACCU-CHEK GUIDE) test strip, 1 each by Other route daily in the afternoon. Use as instructed, Disp: 100 each, Rfl: 3   ipratropium (ATROVENT) 0.06 % nasal spray, Place 2 sprays into both nostrils 4 (four) times daily., Disp: 15 mL, Rfl: 12   ondansetron (ZOFRAN) 8 MG tablet, Take 1 tablet (8 mg total) by mouth as needed for nausea or vomiting., Disp: 60 tablet, Rfl: 1   pantoprazole (PROTONIX) 40 MG tablet, Take 1 tablet (40 mg total) by mouth 2 (two) times daily., Disp: 180 tablet, Rfl: 3   QUEtiapine (SEROQUEL) 300 MG tablet, Take 1 tablet (300 mg total) by mouth at bedtime., Disp: 90 tablet, Rfl: 3   Riboflavin 400 MG CAPS, Take 400 mg  by mouth daily., Disp: , Rfl:    Rimegepant Sulfate (NURTEC) 75 MG TBDP, Take 1 tablet (75 mg total) by mouth as needed (Daily as needed for a Migraine). Maximum 1 tablet in 24 hours.  Quantity 8., Disp: 8 tablet, Rfl: 5   rosuvastatin (CRESTOR) 20 MG tablet, Take 1 tablet (20 mg total) by mouth daily., Disp: 90 tablet, Rfl: 3   tirzepatide (MOUNJARO) 15 MG/0.5ML Pen, Inject 15 mg into the skin once a week., Disp: 2 mL, Rfl: 2   topiramate (TOPAMAX) 100 MG tablet, Take 1 tablet (100 mg total) by mouth at bedtime., Disp: 30 tablet, Rfl: 5   traMADol (ULTRAM) 50 MG tablet, Take 1 tablet (50 mg total) by mouth every 6 (six) hours as needed., Disp: 15 tablet, Rfl: 2  Current Facility-Administered Medications:    0.9 %  sodium chloride infusion, 500 mL, Intravenous, Once, Cirigliano, Vito V, DO  Consent:   NA  Disposition:   Follow-up as needed.  Her questions and concerns were addressed to her satisfaction. She voices understanding of the recommendations provided during this encounter.    Signed, Tessa Lerner, DO, [facility]  [facility]  [address] [place] [address]  [place], [place] [address] 06/12/2023 4:00 PM

## 2023-06-12 NOTE — Patient Instructions (Signed)
 Medication Instructions:  Your physician recommends that you continue on your current medications as directed. Please refer to the Current Medication list given to you today.  *If you need a refill on your cardiac medications before your next appointment, please call your pharmacy*  Lab Work: None ordered today. If you have labs (blood work) drawn today and your tests are completely normal, you will receive your results only by: MyChart Message (if you have MyChart) OR A paper copy in the mail If you have any lab test that is abnormal or we need to change your treatment, we will call you to review the results.  Testing/Procedures: None ordered today.  Follow-Up: At [facility], you and your health needs are our priority.  As part of our continuing mission to provide you with exceptional heart care, we have created designated Provider Care Teams.  These Care Teams include your primary Cardiologist (physician) and Advanced Practice Providers (APPs -  Physician Assistants and Nurse Practitioners) who all work together to provide you with the care you need, when you need it.   Your next appointment:   You have been referred to electrophysiology with Dr. Graciela Husbands. Someone will reach out to you to schedule an appointment.   As needed with Dr. Odis Hollingshead.  The format for your next appointment:   In Person  Provider:   Dr. Odis Hollingshead  Other Instructions You have been referred to electrophysiology with Dr. Graciela Husbands. Someone will reach out to you to schedule an appointment.

## 2023-06-12 NOTE — Patient Instructions (Signed)
 Change from Emgality to Aimovig every 21 days Continue topiramate 100mg  daily  Continue Nurtec and tramadol as needed

## 2023-06-13 ENCOUNTER — Encounter: Payer: Self-pay | Admitting: Family Medicine

## 2023-06-13 ENCOUNTER — Ambulatory Visit (INDEPENDENT_AMBULATORY_CARE_PROVIDER_SITE_OTHER): Admitting: Family Medicine

## 2023-06-13 VITALS — BP 110/80 | HR 100 | Ht 66.0 in | Wt 227.2 lb

## 2023-06-13 DIAGNOSIS — M79642 Pain in left hand: Secondary | ICD-10-CM | POA: Diagnosis not present

## 2023-06-13 NOTE — Patient Instructions (Signed)
 Bring this up with Dr. Doroteo Glassman.   Take a picture of this.   Ice/cold pack over area for 10-15 min twice daily.  OK to take Tylenol 1000 mg (2 extra strength tabs) or 975 mg (3 regular strength tabs) every 6 hours as needed.  Let us know if you need anything.

## 2023-06-13 NOTE — Progress Notes (Signed)
 Chief Complaint  Patient presents with   Follow-up    Patient presents today for a follow-up    Amber [name] is a 43 y.o. female here for a skin complaint.  Duration: several months Location: inside of L ring finger at joint (PIP) Pruritic? No Painful? Yes Drainage? No Trauma? No Fevers? No Other associated symptoms: sometimes is more prominent as a nodule that is purple Therapies tried thus far: none  Past Medical History:  Diagnosis Date   Anxiety    Asthma    Depression    Diabetes (HCC)    GERD (gastroesophageal reflux disease)    History of chicken pox    History of migraine headaches    Migraines    Pituitary adenoma (HCC)    Seizures (HCC)     BP 110/80   Pulse 100   Ht 5\' 6"  (1.676 m)   Wt 227 lb 3.2 oz (103.1 kg)   LMP 05/20/2023 (Exact Date)   SpO2 98%   BMI 36.67 kg/m  Gen: awake, alert, appearing stated age Lungs: No accessory muscle use Skin/MSK: no ext lesions noted. +TTP over the volar surface of the 4th digit on L hand at PIP. No decreased ROM.  No drainage, erythema, fluctuance, excoriation Psych: Age appropriate judgment and insight  Hand pain, left  No obvious lesion today.  She says it comes and goes and is currently not there.  She will take a picture of it when she notices it again.  She has an appointment with her hand specialist this week and will bring it up with him.  Ice, Tylenol, activity as tolerated. F/u prn. The patient voiced understanding and agreement to the plan.  Jilda Roche [place], DO 06/13/23 3:10 PM

## 2023-06-14 ENCOUNTER — Encounter: Payer: Self-pay | Admitting: Gastroenterology

## 2023-06-14 ENCOUNTER — Encounter: Payer: Self-pay | Admitting: Family Medicine

## 2023-06-14 DIAGNOSIS — T7840XA Allergy, unspecified, initial encounter: Secondary | ICD-10-CM

## 2023-06-14 MED ORDER — EPINEPHRINE 0.3 MG/0.3ML IJ SOAJ: 0.3000 mg | INTRAMUSCULAR | 2 refills | Status: AC | PRN

## 2023-06-16 ENCOUNTER — Other Ambulatory Visit: Payer: Self-pay | Admitting: Neurology

## 2023-06-21 ENCOUNTER — Ambulatory Visit (INDEPENDENT_AMBULATORY_CARE_PROVIDER_SITE_OTHER): Admitting: Internal Medicine

## 2023-06-21 ENCOUNTER — Encounter: Payer: Self-pay | Admitting: Internal Medicine

## 2023-06-21 VITALS — BP 122/82 | HR 87 | Temp 97.9°F | Resp 18

## 2023-06-21 DIAGNOSIS — D508 Other iron deficiency anemias: Secondary | ICD-10-CM

## 2023-06-21 DIAGNOSIS — K529 Noninfective gastroenteritis and colitis, unspecified: Secondary | ICD-10-CM

## 2023-06-21 DIAGNOSIS — L501 Idiopathic urticaria: Secondary | ICD-10-CM

## 2023-06-21 DIAGNOSIS — T782XXD Anaphylactic shock, unspecified, subsequent encounter: Secondary | ICD-10-CM

## 2023-06-21 MED ORDER — OMALIZUMAB 150 MG/ML ~~LOC~~ SOSY
150.0000 mg | PREFILLED_SYRINGE | Freq: Once | SUBCUTANEOUS | Status: AC
  Administered 2023-06-21: 150 mg via SUBCUTANEOUS

## 2023-06-21 MED ORDER — OMALIZUMAB 150 MG/ML ~~LOC~~ SOSY
300.0000 mg | PREFILLED_SYRINGE | Freq: Once | SUBCUTANEOUS | Status: DC
Start: 2023-06-21 — End: 2023-06-21
  Administered 2023-07-19: 300 mg via SUBCUTANEOUS

## 2023-06-21 NOTE — Progress Notes (Signed)
 Immunotherapy   Patient Details  Name: Amber [name] MRN: [id number] Date of Birth: [birth year]  06/21/2023  Amber [name] started on Xolair today for Chronic hives. 300 mg sample was given in office  Following schedule: every 4 weeks   Epi-Pen:Epi-Pen Available  Consent signed and patient instructions given.   Modesto Charon 06/21/2023, 2:25 PM

## 2023-06-21 NOTE — Progress Notes (Signed)
 FOLLOW UP Date of Service/Encounter:  06/21/23  Subjective:  Amber [name] (DOB: March 27, 1980) is a 43 y.o. female who [facility] in re-evaluation of the following: anaphylaxis, urticaria, MCAS, POTS, rhinits  History obtained from: chart review and patient.  For Review, LV was on 05/17/23  with Dr. Marlynn Perking seen for  skin testing  . See below for summary of history and diagnostics.   Today presents for follow-up. Discussed the use of AI scribe software for clinical note transcription with the patient, who gave verbal consent to proceed.  History of Present Illness Amber [name] "Amber [name]" is a 43 year old female who presents with a recent severe allergic reaction after eating salmon.  She experienced a severe allergic reaction after consuming salmon and steamed broccoli from a restaurant. Within twenty minutes of eating, she developed throat swelling, mouth tingling, and hives. She administered an EpiPen and subsequently went to the hospital where she received IV Benadryl and steroids. It took about an hour or two for her throat swelling to subside and for her to breathe normally. She was discharged after a few hours once she felt better.  She has ongoing gastrointestinal issues, including vomiting after consuming certain foods like caramel candies. After eating three fun-sized caramel candies, she vomited within fifteen minutes. She can only tolerate chicken and rice that she prepares herself, as other foods cause vomiting. Her recent colonoscopy was normal, with biopsies showing no helicobacter pylori but reactive gastritis.   She has a history of anemia with no identified cause from recent evaluations. She is scheduled to see a hematologist for further assessment.     All medications reviewed by clinical staff and updated in chart. No new pertinent medical or surgical history except as noted in HPI.  ROS: All others negative except as  noted per HPI.   Objective:  LMP 05/20/2023 (Exact Date)  There is no height or weight on file to calculate BMI. Physical Exam: General Appearance:  Alert, cooperative, no distress, appears stated age  Head:  Normocephalic, without obvious abnormality, atraumatic  Eyes:  Conjunctiva clear, EOM's intact  Ears EACs normal bilaterally  Nose: Nares normal,     Throat: Lips, tongue normal; teeth and gums normal,     Neck: Supple, symmetrical  Lungs:     , Respirations unlabored, no coughing  Heart:    , Appears well perfused  Extremities: No edema  Skin: Pale appearing  and no rashes or lesions on visualized portions of skin  Neurologic: No gross deficits   Labs:  Lab Orders  No laboratory test(s) ordered today     Assessment/Plan      Anaphylaxis/MCAS/POTS/ Urticaria  Allergy test: negative to all foods, positive to grass, weed, mold  -tryptase, alpha gal labs were negative  -CU index > 50  -Continue to avoid sesame, soy, wheat, nuts for now   -Written allergy action plan  -Continue to carry EpiPen autoinjector -Contineu cetirizine 10 mg twice a day, Pepcid 20 mg twice a day -Start xolair 300mg  every 4 weeks   -first dose given today in clinic as sample  - our biologic coordinator will be reaching out to you in regards to the approval process    Rhinnorhea  Continue  -Atrovent (ipatopium) nasal spray 1-2 sprays in each nostril up to three times daily AS NEEDED for POST NASAL DRIP/RUNNY NOSE/DRAINAGE.  If you become too dry, use less often.   Follow up: 3 months   Thank  you so much for letting me partake in your care today.  Don't hesitate to reach out if you have any additional concerns!  Ferol Luz, MD  [facility] [place] [place]   Other: samples provided of: xolair  and biologic given in clinic today  Thank you so much for letting me partake in your care today.  Don't hesitate to reach out if you have any additional concerns!  Ferol Luz, MD  [facility] [place], [place]

## 2023-06-21 NOTE — Patient Instructions (Addendum)
 Anaphylaxis/MCAS/POTS/ Urticaria  Allergy test: negative to all foods, positive to grass, weed, mold  -tryptase, alpha gal labs were negative  -CU index > 50  -Continue to avoid sesame, soy, wheat, nuts for now   -Written allergy action plan  -Continue to carry EpiPen autoinjector -Contineu cetirizine 10 mg twice a day, Pepcid 20 mg twice a day -Start xolair 300mg  every 4 weeks   -first dose given today in clinic as sample  - our biologic coordinator will be reaching out to you in regards to the approval process    Rhinnorhea  Continue  -Atrovent (ipatopium) nasal spray 1-2 sprays in each nostril up to three times daily AS NEEDED for POST NASAL DRIP/RUNNY NOSE/DRAINAGE.  If you become too dry, use less often.   Follow up: 3 months   Thank you so much for letting me partake in your care today.  Don't hesitate to reach out if you have any additional concerns!  Ferol Luz, MD  [facility] [place], [place]

## 2023-06-22 ENCOUNTER — Telehealth: Payer: Self-pay | Admitting: *Deleted

## 2023-06-22 MED ORDER — OMALIZUMAB 150 MG/ML ~~LOC~~ SOSY: 300.0000 mg | PREFILLED_SYRINGE | SUBCUTANEOUS | 3 refills | Status: DC

## 2023-06-22 NOTE — Telephone Encounter (Signed)
 Called patient and advised that her [facility] doesn't carry Xolair and only way to get rx is through patient assistance. She advised she called them and they advised they carried same but I called them and they dont carry. I called again and they advised they did cover. Will send Rx and see what happens

## 2023-06-22 NOTE — Telephone Encounter (Signed)
-----   Message from Ferol Luz sent at 06/21/2023  3:35 PM EDT ----- I would like to start this patient on xolair 300mg  for hives. Thanks! She has CHAMPVA

## 2023-06-26 ENCOUNTER — Inpatient Hospital Stay

## 2023-06-26 ENCOUNTER — Inpatient Hospital Stay (HOSPITAL_BASED_OUTPATIENT_CLINIC_OR_DEPARTMENT_OTHER): Admitting: Medical Oncology

## 2023-06-26 ENCOUNTER — Encounter (HOSPITAL_BASED_OUTPATIENT_CLINIC_OR_DEPARTMENT_OTHER): Payer: Self-pay | Admitting: Emergency Medicine

## 2023-06-26 ENCOUNTER — Inpatient Hospital Stay: Attending: Medical Oncology

## 2023-06-26 ENCOUNTER — Other Ambulatory Visit: Payer: Self-pay

## 2023-06-26 ENCOUNTER — Encounter: Payer: Self-pay | Admitting: Medical Oncology

## 2023-06-26 VITALS — BP 87/56 | HR 108 | Temp 98.0°F | Resp 19 | Ht 66.0 in | Wt 222.0 lb

## 2023-06-26 DIAGNOSIS — Z91010 Allergy to peanuts: Secondary | ICD-10-CM | POA: Insufficient documentation

## 2023-06-26 DIAGNOSIS — D509 Iron deficiency anemia, unspecified: Secondary | ICD-10-CM | POA: Diagnosis not present

## 2023-06-26 DIAGNOSIS — R7989 Other specified abnormal findings of blood chemistry: Secondary | ICD-10-CM | POA: Diagnosis not present

## 2023-06-26 DIAGNOSIS — R519 Headache, unspecified: Secondary | ICD-10-CM | POA: Diagnosis present

## 2023-06-26 DIAGNOSIS — E639 Nutritional deficiency, unspecified: Secondary | ICD-10-CM

## 2023-06-26 DIAGNOSIS — G43909 Migraine, unspecified, not intractable, without status migrainosus: Secondary | ICD-10-CM | POA: Insufficient documentation

## 2023-06-26 DIAGNOSIS — D72829 Elevated white blood cell count, unspecified: Secondary | ICD-10-CM

## 2023-06-26 LAB — CBC WITH DIFFERENTIAL (CANCER CENTER ONLY)
Abs Immature Granulocytes: 0.03 10*3/uL (ref 0.00–0.07)
Basophils Absolute: 0 10*3/uL (ref 0.0–0.1)
Basophils Relative: 0 %
Eosinophils Absolute: 0.2 10*3/uL (ref 0.0–0.5)
Eosinophils Relative: 2 %
HCT: 37.6 % (ref 36.0–46.0)
Hemoglobin: 12.8 g/dL (ref 12.0–15.0)
Immature Granulocytes: 0 %
Lymphocytes Relative: 26 %
Lymphs Abs: 2.5 10*3/uL (ref 0.7–4.0)
MCH: 29.4 pg (ref 26.0–34.0)
MCHC: 34 g/dL (ref 30.0–36.0)
MCV: 86.4 fL (ref 80.0–100.0)
Monocytes Absolute: 0.6 10*3/uL (ref 0.1–1.0)
Monocytes Relative: 7 %
Neutro Abs: 6.2 10*3/uL (ref 1.7–7.7)
Neutrophils Relative %: 65 %
Platelet Count: 391 10*3/uL (ref 150–400)
RBC: 4.35 MIL/uL (ref 3.87–5.11)
RDW: 13.7 % (ref 11.5–15.5)
WBC Count: 9.6 10*3/uL (ref 4.0–10.5)
nRBC: 0 % (ref 0.0–0.2)

## 2023-06-26 LAB — RETIC PANEL
Immature Retic Fract: 9.1 % (ref 2.3–15.9)
RBC.: 4.36 MIL/uL (ref 3.87–5.11)
Retic Count, Absolute: 67.6 10*3/uL (ref 19.0–186.0)
Retic Ct Pct: 1.6 % (ref 0.4–3.1)
Reticulocyte Hemoglobin: 33.3 pg (ref >27.9)

## 2023-06-26 LAB — CMP (CANCER CENTER ONLY)
ALT: 31 U/L (ref 0–44)
AST: 27 U/L (ref 15–41)
Albumin: 4.5 g/dL (ref 3.5–5.0)
Alkaline Phosphatase: 30 U/L — ABNORMAL LOW (ref 38–126)
Anion gap: 10 (ref 5–15)
BUN: 9 mg/dL (ref 6–20)
CO2: 23 mmol/L (ref 22–32)
Calcium: 9.7 mg/dL (ref 8.9–10.3)
Chloride: 107 mmol/L (ref 98–111)
Creatinine: 1.05 mg/dL — ABNORMAL HIGH (ref 0.44–1.00)
GFR, Estimated: >60 mL/min (ref >60)
Glucose, Bld: 86 mg/dL (ref 70–99)
Potassium: 3.4 mmol/L — ABNORMAL LOW (ref 3.5–5.1)
Sodium: 140 mmol/L (ref 135–145)
Total Bilirubin: 0.5 mg/dL (ref 0.0–1.2)
Total Protein: 7.1 g/dL (ref 6.5–8.1)

## 2023-06-26 LAB — FERRITIN: Ferritin: 66 ng/mL (ref 11–307)

## 2023-06-26 LAB — VITAMIN B12: Vitamin B-12: 250 pg/mL (ref 180–914)

## 2023-06-26 LAB — FOLATE: Folate: 14.1 ng/mL (ref >5.9)

## 2023-06-26 MED ORDER — CYANOCOBALAMIN 1000 MCG/ML IJ SOLN
1000.0000 ug | Freq: Once | INTRAMUSCULAR | Status: DC
Start: 2023-06-26 — End: 2023-06-26

## 2023-06-26 MED ORDER — CYANOCOBALAMIN 1000 MCG/ML IJ SOLN: 1000.0000 ug | Freq: Once | INTRAMUSCULAR | Status: DC

## 2023-06-26 MED ORDER — CYANOCOBALAMIN 1000 MCG/ML IJ SOLN
1000.0000 ug | Freq: Once | INTRAMUSCULAR | Status: AC
  Administered 2023-06-26: 1000 ug via INTRAMUSCULAR
  Filled 2023-06-26: qty 1

## 2023-06-26 NOTE — Progress Notes (Signed)
 Hematology and Oncology Follow Up Visit  Amber [name] [id number] March 27, 1980 43 y.o. 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 GI evaluation was recommended given her family history  (colon cancer of father). She was also start on IV venofer and is S/P one cycle of treatment.   She also has a history of Mast Cell Activation Syndrome. She is seeing a rheumatologist and immunologist. She is now on zyrtec and pepcid. Feeling a bit better overall. Still fatigued.   Still struggling with her POTS syndrome.   She reports tolerating the IV iron well without side effects however she is having more and more reactions to foods/items. She has had two anaphylactic reactions since our last visit. She was recently started on XOLAIR to help with this. It will take about 6 months to have full effect.   She is tolerating B12 injections well.  There has been no bleeding to her knowledge aside from her regular menstrual cycles  Wt Readings from Last 3 Encounters:  06/26/23 222 lb (100.7 kg)  06/13/23 227 lb 3.2 oz (103.1 kg)  06/12/23 229 lb (103.9 kg)     Medications:   Current Outpatient Medications:    acetaminophen (TYLENOL) 500 MG tablet, Take 500 mg by mouth  as needed., Disp: , Rfl:    cabergoline (DOSTINEX) 0.5 MG tablet, 2.5 mg weekly (Two tabs on Mondays, 1 tab on wednesdays and 2 tabs on Fridays), Disp: 65 tablet, Rfl: 3   cetirizine (ZYRTEC) 10 MG chewable tablet, Chew 10 mg by mouth in the morning and at bedtime., Disp: , Rfl:    Continuous Glucose Sensor (DEXCOM G7 SENSOR) MISC, USE 1 SENSOR TOPICALLY EVERY 10 DAYS, Disp: 1 each, Rfl: 11   EPINEPHrine (EPIPEN 2-PAK) 0.3 mg/0.3 mL IJ SOAJ injection, Inject 0.3 mg into the muscle as needed for anaphylaxis., Disp: 1 each, Rfl: 2   Erenumab-aooe (AIMOVIG) 140 MG/ML SOAJ, Inject 140 mg into the skin every 21 ( twenty-one) days., Disp: 1.12 mL, Rfl: 5   escitalopram (LEXAPRO) 10 MG tablet, Take 1 tablet (10 mg total) by mouth daily., Disp: 90 tablet, Rfl: 3   famotidine (PEPCID) 20 MG tablet, Take 20 mg by mouth 2 (two) times daily., Disp: , Rfl:    fenofibrate (TRICOR) 145 MG tablet, Take 1 tablet (145 mg total) by mouth daily., Disp: 90 tablet, Rfl: 3   folic acid (FOLVITE) 1 MG tablet, Take 1 tablet (1 mg total) by mouth daily., Disp: 30 tablet, Rfl: 6   gabapentin (NEURONTIN) 300 MG capsule, Take 1 capsule by mouth at bedtime., Disp: , Rfl:    glucose blood (ACCU-CHEK GUIDE) test strip, 1 each by Other route  daily in the afternoon. Use as instructed, Disp: 100 each, Rfl: 3   ipratropium (ATROVENT) 0.06 % nasal spray, Place 2 sprays into both nostrils 4 (four) times daily., Disp: 15 mL, Rfl: 12   omalizumab (XOLAIR) 150 MG/ML prefilled syringe, Inject 300 mg into the skin every 28 (twenty-eight) days., Disp: 6 mL, Rfl: 3   ondansetron (ZOFRAN) 8 MG tablet, Take 1 tablet (8 mg total) by mouth as needed for nausea or vomiting., Disp: 60 tablet, Rfl: 1   pantoprazole (PROTONIX) 40 MG tablet, Take 1 tablet (40 mg total) by mouth 2 (two) times daily., Disp: 180 tablet, Rfl: 3   QUEtiapine (SEROQUEL) 300 MG tablet, Take 1 tablet (300 mg total) by mouth at bedtime., Disp: 90 tablet, Rfl: 3   Riboflavin 400  MG CAPS, Take 400 mg by mouth daily., Disp: , Rfl:    Rimegepant Sulfate (NURTEC) 75 MG TBDP, Take 1 tablet (75 mg total) by mouth as needed (Daily as needed for a Migraine). Maximum 1 tablet in 24 hours.  Quantity 8., Disp: 8 tablet, Rfl: 5   rosuvastatin (CRESTOR) 20 MG tablet, Take 1 tablet (20 mg total) by mouth daily., Disp: 90 tablet, Rfl: 3   tirzepatide (MOUNJARO) 15 MG/0.5ML Pen, Inject 15 mg into the skin once a week., Disp: 2 mL, Rfl: 2   topiramate (TOPAMAX) 100 MG tablet, Take 1 tablet (100 mg total) by mouth at bedtime., Disp: 30 tablet, Rfl: 5   traMADol (ULTRAM) 50 MG tablet, Take 1 tablet (50 mg total) by mouth every 6 (six) hours as needed., Disp: 15 tablet, Rfl: 2  Current Facility-Administered Medications:    0.9 %  sodium chloride infusion, 500 mL, Intravenous, Once, Cirigliano, Vito V, DO  Facility-Administered Medications Ordered in Other Visits:    cyanocobalamin (VITAMIN B12) injection 1,000 mcg, 1,000 mcg, Intramuscular, Once, Jiovani Mccammon M, PA-C  Allergies:  Allergies  Allergen Reactions   Bee Venom Anaphylaxis   Chlorhexidine Hives, Itching and Rash   Ibuprofen Other (See Comments)    GI bleed   Iodine Hives, Itching and Rash   Peanut-Containing Drug Products Other (See Comments)    Pt reports unknown   Sesame Seed (Diagnostic) Anaphylaxis   Shellfish Allergy Shortness Of Breath, Swelling and Other (See Comments)    Tingling in the lips, mouth and throat   Soybean-Containing Drug Products Anaphylaxis   Sulfa Antibiotics Anaphylaxis   Triptans Other (See Comments)    Makes pain worse. Pain in joints   Lunesta [Eszopiclone] Nausea And Vomiting    GI issues   Almond (Diagnostic) Other (See Comments)    Unknown per pt   Aspirin Other (See Comments)    UNKNOWN REACTION. Her parents and siblings are allergic to Aspirin.   Cashew Nut (Anacardium Occidentale) Skin Test Other (See Comments)    Unknown per pt   Hazelnut (Filbert) Other (See Comments)     Unknown per pt   Nsaids Nausea And Vomiting   Tape Rash    Blisters    Walnut Other (See Comments)    Unknown per pt   Wheat Nausea And Vomiting    Past Medical History, Surgical history, Social history, and Family History were reviewed and updated.  Review of Systems: As stated above in HPI   Physical Exam:  height is 5\' 6"  (1.676 m) and weight is 222 lb (100.7 kg). Her oral temperature is 98 F (36.7 C). Her blood pressure is 87/56 (abnormal) and her pulse is 108 (abnormal). Her respiration is  19 and oxygen saturation is 99%.   Physical Exam General: NAD Cardiovascular: regular rate and rhythm Pulmonary: clear ant fields Abdomen: soft, nontender, + bowel sounds GU: no suprapubic tenderness Extremities: no edema, no joint deformities Skin: no rashes Neurological: Weakness but otherwise nonfocal   Lab Results  Component Value Date   WBC 9.6 06/26/2023   HGB 12.8 06/26/2023   HCT 37.6 06/26/2023   MCV 86.4 06/26/2023   PLT 391 06/26/2023     Chemistry      Component Value Date/Time   NA 140 06/26/2023 1415   K 3.4 (L) 06/26/2023 1415   CL 107 06/26/2023 1415   CO2 23 06/26/2023 1415   BUN 9 06/26/2023 1415   CREATININE 1.05 (H) 06/26/2023 1415   CREATININE 0.99 12/24/2019 1316      Component Value Date/Time   CALCIUM 9.7 06/26/2023 1415   ALKPHOS 30 (L) 06/26/2023 1415   AST 27 06/26/2023 1415   ALT 31 06/26/2023 1415   BILITOT 0.5 06/26/2023 1415     Encounter Diagnoses  Name Primary?   Iron deficiency anemia, unspecified iron deficiency anemia type Yes   Low vitamin B12 level    Nutritional deficiency     Assessment and Plan- Patient is a 43 y.o. female with multifactorial anemia. She has IDA secondary to malabsorption(GERD/PPI use/ MCAS) and from chronic loss (menstrual cycles). She also has a history of nutritional deficiencies.   With greater involvement of her [facility] I am hesitant to have her on IV iron due to risk of severe allergic reaction. I  would like for her to trial a freeze dried organ meat supplement or potentially Accufer. She will continue to get IM B12 injections.   Disposition: B12 today RTC 1 month APP, labs (CBC w/, retic, iron, ferritin, B12), B12   Clent Jacks PA-C 4/7/20253:28 PM

## 2023-06-26 NOTE — ED Triage Notes (Signed)
 Pt reports hx of migraines, states started migraine with n/v since this AM, reports taking a Nurtec at 1530 that has not helped

## 2023-06-26 NOTE — Patient Instructions (Signed)
 Vitamin B12 Injection What is this medication? Vitamin B12 (VAHY tuh min B12) prevents and treats low vitamin B12 levels in your body. It is used in people who do not get enough vitamin B12 from their diet or when their digestive tract does not absorb enough. Vitamin B12 plays an important role in maintaining the health of your nervous system and red blood cells. This medicine may be used for other purposes; ask your health care provider or pharmacist if you have questions. COMMON BRAND NAME(S): B-12 Compliance Kit, B-12 Injection Kit, Cyomin, Dodex, LA-12, Nutri-Twelve, Physicians EZ Use B-12, Primabalt, Vitamin Deficiency Injectable System - B12 What should I tell my care team before I take this medication? They need to know if you have any of these conditions: Kidney disease Leber's disease Megaloblastic anemia An unusual or allergic reaction to cyanocobalamin, cobalt, other medications, foods, dyes, or preservatives Pregnant or trying to get pregnant Breast-feeding How should I use this medication? This medication is injected into a muscle or deeply under the skin. It is usually given in a clinic or care team's office. However, your care team may teach you how to inject yourself. Follow all instructions. Talk to your care team about the use of this medication in children. Special care may be needed. Overdosage: If you think you have taken too much of this medicine contact a poison control center or emergency room at once. NOTE: This medicine is only for you. Do not share this medicine with others. What if I miss a dose? If you are given your dose at a clinic or care team's office, call to reschedule your appointment. If you give your own injections, and you miss a dose, take it as soon as you can. If it is almost time for your next dose, take only that dose. Do not take double or extra doses. What may interact with this medication? Alcohol Colchicine This list may not describe all possible  interactions. Give your health care provider a list of all the medicines, herbs, non-prescription drugs, or dietary supplements you use. Also tell them if you smoke, drink alcohol, or use illegal drugs. Some items may interact with your medicine. What should I watch for while using this medication? Visit your care team regularly. You may need blood work done while you are taking this medication. You may need to follow a special diet. Talk to your care team. Limit your alcohol intake and avoid smoking to get the best benefit. What side effects may I notice from receiving this medication? Side effects that you should report to your care team as soon as possible: Allergic reactions--skin rash, itching, hives, swelling of the face, lips, tongue, or throat Swelling of the ankles, hands, or feet Trouble breathing Side effects that usually do not require medical attention (report to your care team if they continue or are bothersome): Diarrhea This list may not describe all possible side effects. Call your doctor for medical advice about side effects. You may report side effects to FDA at 1-800-FDA-1088. Where should I keep my medication? Keep out of the reach of children. Store at room temperature between 15 and 30 degrees C (59 and 85 degrees F). Protect from light. Throw away any unused medication after the expiration date. NOTE: This sheet is a summary. It may not cover all possible information. If you have questions about this medicine, talk to your doctor, pharmacist, or health care provider.  2024 Elsevier/Gold Standard (2020-11-17 00:00:00)

## 2023-06-27 ENCOUNTER — Encounter: Payer: Self-pay | Admitting: Medical Oncology

## 2023-06-27 ENCOUNTER — Telehealth: Payer: Self-pay | Admitting: *Deleted

## 2023-06-27 ENCOUNTER — Emergency Department (HOSPITAL_BASED_OUTPATIENT_CLINIC_OR_DEPARTMENT_OTHER)
Admission: EM | Admit: 2023-06-27 | Discharge: 2023-06-27 | Disposition: A | Discharge status: Home / Self Care | Attending: Emergency Medicine | Admitting: Emergency Medicine

## 2023-06-27 DIAGNOSIS — R519 Headache, unspecified: Secondary | ICD-10-CM

## 2023-06-27 LAB — IRON AND IRON BINDING CAPACITY (CC-WL,HP ONLY)
Iron: 74 ug/dL (ref 28–170)
Saturation Ratios: 17 % (ref 10.4–31.8)
TIBC: 444 ug/dL (ref 250–450)
UIBC: 370 ug/dL (ref 148–442)

## 2023-06-27 MED ORDER — METOCLOPRAMIDE HCL 5 MG/ML IJ SOLN
10.0000 mg | Freq: Once | INTRAMUSCULAR | Status: AC
  Administered 2023-06-27: 10 mg via INTRAVENOUS
  Filled 2023-06-27: qty 2

## 2023-06-27 MED ORDER — DEXAMETHASONE 4 MG PO TABS
10.0000 mg | ORAL_TABLET | Freq: Once | ORAL | Status: AC
Start: 2023-06-27 — End: 2023-06-27
  Administered 2023-06-27: 10 mg via ORAL
  Filled 2023-06-27: qty 3

## 2023-06-27 MED ORDER — SODIUM CHLORIDE 0.9 % IV BOLUS
1000.0000 mL | Freq: Once | INTRAVENOUS | Status: AC
  Administered 2023-06-27: 1000 mL via INTRAVENOUS

## 2023-06-27 MED ORDER — HYDROMORPHONE HCL 1 MG/ML IJ SOLN
0.5000 mg | Freq: Once | INTRAMUSCULAR | Status: AC
  Administered 2023-06-27: 0.5 mg via INTRAVENOUS
  Filled 2023-06-27: qty 1

## 2023-06-27 MED ORDER — DIPHENHYDRAMINE HCL 50 MG/ML IJ SOLN
25.0000 mg | Freq: Once | INTRAMUSCULAR | Status: AC
  Administered 2023-06-27: 25 mg via INTRAVENOUS
  Filled 2023-06-27: qty 1

## 2023-06-27 NOTE — ED Provider Notes (Signed)
 [facility] Provider Note   CSN: [id number] Arrival date & time: 06/26/23  2111     History  Chief Complaint  Patient presents with   Migraine    Amber [name] is a 43 y.o. female.  4 yoF with a chief complaints of migraine.  This is a feels like her normal migraines.  She took her abortive medication today but was not successful.  She denies one-sided numbness or weakness or difficulty speech or swallowing.  Denies head injury.   Migraine       Home Medications Prior to Admission medications   Medication Sig Start Date End Date Taking? Authorizing Provider  acetaminophen (TYLENOL) 500 MG tablet Take 500 mg by mouth as needed.    [provider]  cabergoline (DOSTINEX) 0.5 MG tablet 2.5 mg weekly (Two tabs on Mondays, 1 tab on wednesdays and 2 tabs on Fridays) 01/09/23   Shamleffer, Konrad Dolores, MD  cetirizine (ZYRTEC) 10 MG chewable tablet Chew 10 mg by mouth in the morning and at bedtime.    [provider]  Continuous Glucose Sensor (DEXCOM G7 SENSOR) MISC USE 1 SENSOR TOPICALLY EVERY 10 DAYS 06/08/23   Sharlene Dory, DO  EPINEPHrine (EPIPEN 2-PAK) 0.3 mg/0.3 mL IJ SOAJ injection Inject 0.3 mg into the muscle as needed for anaphylaxis. 06/14/23   Wendling, Jilda Roche, DO  Erenumab-aooe (AIMOVIG) 140 MG/ML SOAJ Inject 140 mg into the skin every 21 ( twenty-one) days. 06/12/23   Everlena Cooper, Adam R, DO  escitalopram (LEXAPRO) 10 MG tablet Take 1 tablet (10 mg total) by mouth daily. 05/22/23   Sharlene Dory, DO  famotidine (PEPCID) 20 MG tablet Take 20 mg by mouth 2 (two) times daily.    [provider]  fenofibrate (TRICOR) 145 MG tablet Take 1 tablet (145 mg total) by mouth daily. 05/22/23   Sharlene Dory, DO  folic acid (FOLVITE) 1 MG tablet Take 1 tablet (1 mg total) by mouth daily. 05/23/23   Rushie Chestnut, PA-C  gabapentin (NEURONTIN) 300 MG capsule Take 1 capsule by  mouth at bedtime. 01/25/23   [provider]  glucose blood (ACCU-CHEK GUIDE) test strip 1 each by Other route daily in the afternoon. Use as instructed 01/06/23   Shamleffer, Konrad Dolores, MD  ipratropium (ATROVENT) 0.06 % nasal spray Place 2 sprays into both nostrils 4 (four) times daily. 05/09/23   Ferol Luz, MD  omalizumab [facility]) 150 MG/ML prefilled syringe Inject 300 mg into the skin every 28 (twenty-eight) days. 06/22/23   Ferol Luz, MD  ondansetron (ZOFRAN) 8 MG tablet Take 1 tablet (8 mg total) by mouth as needed for nausea or vomiting. 01/27/23   Everlena Cooper, Adam R, DO  pantoprazole (PROTONIX) 40 MG tablet Take 1 tablet (40 mg total) by mouth 2 (two) times daily. 05/22/23   Sharlene Dory, DO  QUEtiapine (SEROQUEL) 300 MG tablet Take 1 tablet (300 mg total) by mouth at bedtime. 05/22/23   Sharlene Dory, DO  Riboflavin 400 MG CAPS Take 400 mg by mouth daily.    [provider]  Rimegepant Sulfate (NURTEC) 75 MG TBDP Take 1 tablet (75 mg total) by mouth as needed (Daily as needed for a Migraine). Maximum 1 tablet in 24 hours.  Quantity 8. 06/12/23   Everlena Cooper, Adam R, DO  rosuvastatin (CRESTOR) 20 MG tablet Take 1 tablet (20 mg total) by mouth daily. 09/01/22   Sharlene Dory, DO  tirzepatide [facility]) 15 MG/0.5ML  Pen Inject 15 mg into the skin once a week. 05/18/23   Sharlene Dory, DO  topiramate (TOPAMAX) 100 MG tablet Take 1 tablet (100 mg total) by mouth at bedtime. 06/12/23   Drema Dallas, DO  traMADol (ULTRAM) 50 MG tablet Take 1 tablet (50 mg total) by mouth every 6 (six) hours as needed. 06/12/23   Drema Dallas, DO      Allergies    Bee venom, Chlorhexidine, Ibuprofen, Iodine, Peanut-containing drug products, Sesame seed (diagnostic), Shellfish allergy, Soybean-containing drug products, Sulfa antibiotics, Triptans, Lunesta [eszopiclone], Almond (diagnostic), Aspirin, Cashew nut (anacardium occidentale) skin test, Hazelnut  (filbert), Nsaids, Tape, Walnut, and Wheat    Review of Systems   Review of Systems  Physical Exam Updated Vital Signs BP (!) 104/92 (BP Location: Left Arm)   Pulse (!) 104   Temp 97.9 F (36.6 C) (Oral)   Resp 17   Ht 5\' 6"  (1.676 m)   Wt 99.8 kg   LMP 06/01/2023 (Exact Date)   SpO2 99%   BMI 35.51 kg/m  Physical Exam Vitals and nursing note reviewed.  Constitutional:      General: She is not in acute distress.    Appearance: She is well-developed. She is not diaphoretic.  HENT:     Head: Normocephalic and atraumatic.  Eyes:     Pupils: Pupils are equal, round, and reactive to light.  Cardiovascular:     Rate and Rhythm: Normal rate and regular rhythm.     Heart sounds: No murmur heard.    No friction rub. No gallop.  Pulmonary:     Effort: Pulmonary effort is normal.     Breath sounds: No wheezing or rales.  Abdominal:     General: There is no distension.     Palpations: Abdomen is soft.     Tenderness: There is no abdominal tenderness.  Musculoskeletal:        General: No tenderness.     Cervical back: Normal range of motion and neck supple.  Skin:    General: Skin is warm and dry.  Neurological:     Mental Status: She is alert and oriented to person, place, and time.     Cranial Nerves: Cranial nerves 2-12 are intact.     Sensory: Sensation is intact.     Motor: Motor function is intact.     Coordination: Coordination is intact.     Comments: Benign neuro exam  Psychiatric:        Behavior: Behavior normal.     ED Results / Procedures / Treatments   Labs (all labs ordered are listed, but only abnormal results are displayed) Labs Reviewed - No data to display  EKG None  Radiology No results found.  Procedures Procedures    Medications Ordered in ED Medications  metoCLOPramide (REGLAN) injection 10 mg (10 mg Intravenous Given 06/27/23 0107)  diphenhydrAMINE (BENADRYL) injection 25 mg (25 mg Intravenous Given 06/27/23 0102)  dexamethasone  (DECADRON) tablet 10 mg (10 mg Oral Given 06/27/23 0057)  sodium chloride 0.9 % bolus 1,000 mL (1,000 mLs Intravenous New Bag/Given 06/27/23 0111)  HYDROmorphone (DILAUDID) injection 0.5 mg (0.5 mg Intravenous Given 06/27/23 0104)    ED Course/ Medical Decision Making/ A&P                                 Medical Decision Making Risk Prescription drug management.   43 yo F with a  chief complaints of headache.  Feels typical of her migraines.  Came on like they typically do.  Did not improve with her abortive therapy.  She says she needs to come to the ED frequently.  She knows which medications that she typically gets with improvement.  Will attempt here.  Reassess.  Patient reassessed and is feeling much better.  Will discharge home.  Neurology follow-up.  2:11 AM:  I have discussed the diagnosis/risks/treatment options with the patient.  Evaluation and diagnostic testing in the emergency department does not suggest an emergent condition requiring admission or immediate intervention beyond what has been performed at this time.  They will follow up with Neuro, PCP. We also discussed returning to the ED immediately if new or worsening sx occur. We discussed the sx which are most concerning (e.g., sudden worsening pain, fever, inability to tolerate by mouth, stroke s/sx) that necessitate immediate return. Medications administered to the patient during their visit and any new prescriptions provided to the patient are listed below.  Medications given during this visit Medications  metoCLOPramide (REGLAN) injection 10 mg (10 mg Intravenous Given 06/27/23 0107)  diphenhydrAMINE (BENADRYL) injection 25 mg (25 mg Intravenous Given 06/27/23 0102)  dexamethasone (DECADRON) tablet 10 mg (10 mg Oral Given 06/27/23 0057)  sodium chloride 0.9 % bolus 1,000 mL (1,000 mLs Intravenous New Bag/Given 06/27/23 0111)  HYDROmorphone (DILAUDID) injection 0.5 mg (0.5 mg Intravenous Given 06/27/23 0104)     The patient  appears reasonably screen and/or stabilized for discharge and I doubt any other medical condition or other [facility] requiring further screening, evaluation, or treatment in the ED at this time prior to discharge.          Final Clinical Impression(s) / ED Diagnoses Final diagnoses:  Bad headache    Rx / DC Orders ED Discharge Orders     None         Melene Plan, DO 06/27/23 0211

## 2023-06-27 NOTE — Discharge Instructions (Signed)
 Let your neurologist know about your headache.  Please return for worsening headache or if it is different than her typical headaches one-sided numbness or weakness or difficulty speech or swallowing.

## 2023-06-27 NOTE — Telephone Encounter (Signed)
 Jared at ext [phone] called to inquire about epipen and I advised that we had  sent same. The other issue is that [facility] cannot sent Xolair to clinic and cannot send to patient unless she has gotten 3 injs in clinic. I will work on getting smples for same then she will need to reach back out to [facility] to fill

## 2023-06-28 ENCOUNTER — Encounter: Payer: Self-pay | Admitting: Family Medicine

## 2023-06-28 ENCOUNTER — Ambulatory Visit (INDEPENDENT_AMBULATORY_CARE_PROVIDER_SITE_OTHER): Admitting: Family Medicine

## 2023-06-28 VITALS — BP 112/70 | HR 101 | Ht 66.0 in | Wt 226.2 lb

## 2023-06-28 DIAGNOSIS — D489 Neoplasm of uncertain behavior, unspecified: Secondary | ICD-10-CM

## 2023-06-28 DIAGNOSIS — M79641 Pain in right hand: Secondary | ICD-10-CM | POA: Diagnosis not present

## 2023-06-28 DIAGNOSIS — M79642 Pain in left hand: Secondary | ICD-10-CM

## 2023-06-28 LAB — VITAMIN B1: Vitamin B1 (Thiamine): 85.3 nmol/L (ref 66.5–200.0)

## 2023-06-28 NOTE — Patient Instructions (Addendum)
 Give [place] 5-6 business days to get the results of your labs back.   Do not shower for the rest of the day. When you do wash it, use only soap and water. Do not vigorously scrub. Apply triple antibiotic ointment (like Neosporin) twice daily. Keep the area clean and dry.   Things to look out for: increasing pain not relieved by ibuprofen/acetaminophen, fevers, spreading redness, drainage of pus, or foul odor.  Give [place] 1 week to get the results of your biopsy back.  The lesion on your L leg is a skin tag.   The lesion on the R side of the chest looks like a nevus (benign). Look for changes and let me know if you notice any.   Let us know if you need anything.

## 2023-06-28 NOTE — Progress Notes (Signed)
 Chief Complaint  Patient presents with   Acute Visit    Patient presents today for a skin check. She reports 2 moles .    Amber [name] is a 43 y.o. female here for a skin complaint.  Duration: 1 week for the chest, years for the leg lesion-bled 1 week ago w/o trauma Location: R side of chest and behind R leg Pruritic? No Painful? No Drainage? No New soaps/lotions/topicals/detergents? No Trauma? No Other associated symptoms: not changing in size Therapies tried thus far: none  Patient has a several month history of bilateral hand pain.  Her PIP joints are mainly affected.  It is affecting all digits equally.  No injury or change in activity.  Achy in nature.  She has never had an autoimmune lab workup.  Past Medical History:  Diagnosis Date   Anxiety    Asthma    Depression    Diabetes (HCC)    GERD (gastroesophageal reflux disease)    History of chicken pox    History of migraine headaches    Migraines    Pituitary adenoma (HCC)    Seizures (HCC)     BP 112/70   Pulse (!) 101   Ht 5\' 6"  (1.676 m)   Wt 226 lb 3.2 oz (102.6 kg)   LMP 06/01/2023 (Exact Date)   SpO2 98%   BMI 36.51 kg/m  Gen: awake, alert, appearing stated age MSK: no ttp over PIP, MCP or DIP jts of hands. No obvious synovitis. No deformity, erythema, edema.  Normal range of motion. Lungs: No accessory muscle use Skin: 0.4 x 0.3 cm on R upper chest hyperpigmented, smooth borders of this elliptically shaped lesion. No drainage, erythema, TTP, fluctuance, excoriation There is a 0.7 x 0.5 cm raised and mixed colored lesion on the distal posterior thigh on the right.  It is both pink and brownish in color.  Borders are relatively smooth.  No TTP, fluctuance, or drainage. Psych: Age appropriate judgment and insight  Bilateral hand pain - Plan: Sedimentation rate, Antinuclear Antib (ANA), Rheumatoid Factor, Cyclic citrul peptide antibody, IgG (QUEST), Aldolase, Anti-Smith antibody, Anti-DNA antibody,  double-stranded, Sjogrens syndrome-A extractable nuclear antibody, Sjogrens syndrome-B extractable nuclear antibody  Neoplasm of uncertain behavior - Plan: Dermatology pathology, PR SHVG SKIN LESION 1 TRUNK/ARM/LEG DIAM 0.6-1.0 CM  Ck AI labs. Ice, Tylenol. Consider OT versus referral if labs neg.  Biopsy today.  Aftercare instructions verbalized and written down. Reassurance given for the other upper chest lesion but she will let us know if anything changes. F/u prn. The patient voiced understanding and agreement to the plan.  Jilda Roche [place], DO 06/28/23 3:59 PM

## 2023-06-28 NOTE — Telephone Encounter (Signed)
 Pt called as pharmacy needs verbal order to prescribe Rx Aimovig sent [facility] - [place], [place] - [id number] [phone]

## 2023-06-29 ENCOUNTER — Encounter: Payer: Self-pay | Admitting: Gastroenterology

## 2023-06-29 ENCOUNTER — Encounter: Payer: Self-pay | Admitting: Family Medicine

## 2023-06-29 DIAGNOSIS — M79642 Pain in left hand: Secondary | ICD-10-CM

## 2023-06-29 LAB — SEDIMENTATION RATE: Sed Rate: 2 mm/h (ref 0–20)

## 2023-06-30 ENCOUNTER — Other Ambulatory Visit: Payer: Self-pay

## 2023-06-30 DIAGNOSIS — R1319 Other dysphagia: Secondary | ICD-10-CM

## 2023-06-30 DIAGNOSIS — K219 Gastro-esophageal reflux disease without esophagitis: Secondary | ICD-10-CM

## 2023-06-30 LAB — ANTI-DNA ANTIBODY, DOUBLE-STRANDED: ds DNA Ab: <1 [IU]/mL

## 2023-06-30 LAB — CYCLIC CITRUL PEPTIDE ANTIBODY, IGG: Cyclic Citrullin Peptide Ab: <16 U

## 2023-06-30 LAB — ANA: Anti Nuclear Antibody (ANA): NEGATIVE

## 2023-06-30 LAB — SJOGRENS SYNDROME-B EXTRACTABLE NUCLEAR ANTIBODY: SSB (La) (ENA) Antibody, IgG: <1 AI

## 2023-06-30 LAB — ANTI-SMITH ANTIBODY: ENA SM Ab Ser-aCnc: <1 AI

## 2023-06-30 LAB — ALDOLASE: Aldolase: 2.7 U/L (ref <=8.1)

## 2023-06-30 LAB — SJOGRENS SYNDROME-A EXTRACTABLE NUCLEAR ANTIBODY: SSA (Ro) (ENA) Antibody, IgG: <1 AI

## 2023-06-30 LAB — RHEUMATOID FACTOR: Rheumatoid fact SerPl-aCnc: <10 [IU]/mL (ref <14)

## 2023-07-04 ENCOUNTER — Encounter: Payer: Self-pay | Admitting: Family Medicine

## 2023-07-04 ENCOUNTER — Other Ambulatory Visit: Payer: Self-pay | Admitting: Neurology

## 2023-07-04 NOTE — Telephone Encounter (Signed)
 Referral placed.

## 2023-07-05 LAB — DERMATOLOGY PATHOLOGY

## 2023-07-10 ENCOUNTER — Telehealth: Payer: Self-pay

## 2023-07-10 NOTE — Telephone Encounter (Signed)
 Initial Comment Caller states she stood up from the sofa and went to the pantry and she knelt down and and stood up when she did she immediately experienced tunnel vision. She also experienced body trimmers. Translation No Nurse Assessment Nurse: D'Heur Odean Bend, RN, Adrienne Date/Time (Eastern Time): 07/10/2023 9:08:19 AM Confirm and document reason for call. If symptomatic, describe symptoms. ---Caller states she experienced tunnel vision when she stood up from a kneeling position; this happened three days ago. She also experienced tremors. She states she has had syncopal episodes in the past and is going to see a cardiologist in July to assess for POTS. 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 Her legs gave out and she bumped her face on the door jamb. She states these episodes happen several times a week and she can feel them coming on. She had two syncopal episodes on 3/31 and one on 3/24. Does the patient have any new or worsening symptoms? ---Yes Will a triage be completed? ---Yes Related visit to physician within the last 2 weeks? ---No Does the PT have any chronic conditions? (i.e. diabetes, asthma, this includes High risk factors for pregnancy, etc.) ---Yes List chronic conditions. ---pituitary tumor, migraines, high cholesterol, anemia, low B-12, low folate, low K+ Is the patient pregnant or possibly pregnant? (Ask all females between the ages of 57-55) ---No Is this a behavioral health or substance abuse call? ---No PLEASE NOTE: All timestamps contained within this report are represented as [place] Standard Time. CONFIDENTIALTY NOTICE: This fax transmission is intended only for the addressee. It contains information that is legally privileged, confidential or otherwise protected from use or disclosure. If you are not the intended recipient, you are strictly prohibited from reviewing, disclosing, copying using or disseminating any of this information or taking any action  in reliance on or regarding this information. If you have received this fax in error, please notify us  immediately by telephone so that we can arrange for its return to us . Phone: [phone], Toll-Free: [phone], Fax: [phone] [email or web address]_ETTERLEE 1981/01/31 Page: 2 of 2 CallId: [id number] Guidelines Guideline Title Affirmed Question Affirmed Notes Nurse Date/Time [facility] Time) Fainting Simple fainting is a chronic symptom (has occurred multiple times) D'Heur Odean Bend, RN, Adrienne 07/10/2023 9:16:17 AM Disp. Time [facility] Time) Disposition Final User 07/10/2023 9:21:51 AM See PCP within 2 Weeks Yes D'Heur Odean Bend, RN, Adrienne Final Disposition 07/10/2023 9:21:51 AM See PCP within 2 Weeks Yes D'Heur Odean Bend, RN, Nerissa Bannister Caller Disagree/Comply Comply Caller Understands Yes PreDisposition Call Doctor Care Advice Given Per Guideline SEE PCP WITHIN 2 WEEKS: * You need to be seen for this ongoing problem within the next 2 weeks. TREATMENT - FAINTING: * Lie down with your feet elevated for 10 minutes. Reason: Simple fainting is due to temporarily decreased blood flow to the brain. CALL BACK IF: * You pass out again on the same day * Other new or concerning symptoms * You become worse CARE ADVICE given per Fainting (Adult) guideline. Comments User: Nerissa Bannister, D'Heur Odean Bend, RN Date/Time [facility] Time): 07/10/2023 9:16:44 AM Caller takes antacids

## 2023-07-10 NOTE — Telephone Encounter (Signed)
 Called pt and she has appt schedule. Pt stated this happened on 07/07/23. Pt was advised if she has anymore symptom,  To go ER.

## 2023-07-12 ENCOUNTER — Ambulatory Visit (HOSPITAL_COMMUNITY)
Admission: RE | Admit: 2023-07-12 | Discharge: 2023-07-12 | Disposition: A | Discharge status: Home / Self Care | Attending: Gastroenterology | Admitting: Gastroenterology

## 2023-07-12 ENCOUNTER — Encounter (HOSPITAL_COMMUNITY)
Admission: RE | Disposition: A | Discharge status: Home / Self Care | Payer: Self-pay | Source: Home / Self Care | Attending: Gastroenterology

## 2023-07-12 DIAGNOSIS — R1319 Other dysphagia: Secondary | ICD-10-CM | POA: Insufficient documentation

## 2023-07-12 DIAGNOSIS — K219 Gastro-esophageal reflux disease without esophagitis: Secondary | ICD-10-CM | POA: Insufficient documentation

## 2023-07-12 HISTORY — PX: PH IMPEDANCE STUDY: SHX5565

## 2023-07-12 HISTORY — PX: ESOPHAGEAL MANOMETRY: SHX5429

## 2023-07-12 SURGERY — IMPEDANCE PH STUDY, ESOPHAGUS

## 2023-07-12 MED ORDER — LIDOCAINE VISCOUS HCL 2 % MT SOLN
OROMUCOSAL | Status: AC
  Filled 2023-07-12: qty 15

## 2023-07-12 SURGICAL SUPPLY — 2 items
FACESHIELD LNG OPTICON STERILE (SAFETY) IMPLANT
GLOVE BIO SURGEON STRL SZ8 (GLOVE) ×2 IMPLANT

## 2023-07-12 NOTE — Progress Notes (Signed)
 Esophageal Manometry done per protocol. Pt tolerated well with out complication. Ph with impedance done per protocol. Pt tolerated well. Instructions given regarding the study and monitor. Pt verbalized understand and return demonstrated use of monitor. Pt will return tomorrow to have probe removed and monitor downloaded.

## 2023-07-16 ENCOUNTER — Encounter (HOSPITAL_COMMUNITY): Payer: Self-pay | Admitting: Gastroenterology

## 2023-07-19 ENCOUNTER — Ambulatory Visit: Admitting: Family Medicine

## 2023-07-19 ENCOUNTER — Ambulatory Visit (INDEPENDENT_AMBULATORY_CARE_PROVIDER_SITE_OTHER)

## 2023-07-19 DIAGNOSIS — L501 Idiopathic urticaria: Secondary | ICD-10-CM | POA: Diagnosis not present

## 2023-07-21 ENCOUNTER — Telehealth: Payer: Self-pay | Admitting: Gastroenterology

## 2023-07-21 NOTE — Telephone Encounter (Signed)
 Left message for patient to return call to further discuss results.  Will continue efforts.

## 2023-07-21 NOTE — Telephone Encounter (Signed)
 Results from the Esophageal Manometry study were reviewed and were normal.  Results from the pH/impedance study (conducted off acid suppression therapy) were reviewed and notable for the following:  1.  Increased esophageal acid exposure with % time pH <4 of 6.2% (normal <4%).  DeMeester score 22.5 (normal <14.72) 2.  Esophageal acid exposure is abnormal in supine position 3.  Overall reflux events are increased, predominantly acid and weekly acid reflux episodes 4.  Positive symptom correlation for belching with reflux events based on SAP  Impression: Overall, these results are consistent with significant supine/nocturnal gastroesophageal reflux with increased esophageal acid exposure  Recommendations: - Continue Protonix  40 mg twice daily.  If still having breakthrough symptoms despite high-dose Protonix , we will consider either changing to Aciphex, Dexilant, or potentially Voquenza - If reflux symptoms not well-controlled with acid suppression medications, we can consider referral to the Surgical Clinic for antireflux surgical options - Can schedule follow-up in the GI clinic  Harry Lindau, DO, [facility]

## 2023-07-21 NOTE — Telephone Encounter (Signed)
 Patient returned call to the office and was made aware of Dr Andre Kawasaki recommendations including:   "Overall, these results are consistent with significant supine/nocturnal gastroesophageal reflux with increased esophageal acid exposure   Recommendations: - Continue Protonix  40 mg twice daily.  If still having breakthrough symptoms despite high-dose Protonix , we will consider either changing to Aciphex, Dexilant, or potentially Voquenza - If reflux symptoms not well-controlled with acid suppression medications, we can consider referral to the Surgical Clinic for antireflux surgical options - Can schedule follow-up in the GI clinic"    Patient would like to be change to Aciphex, Dexilant, or Voquezna as she stated she has been taking high dose Protonix  for many years. She would also like to meet with Surgery Clinic to further discuss antireflux surgical options.  Patient aware that I will follow up with Dr Karene Oto for further recommendations.  Patient agreed to plan and verbalized understanding.

## 2023-07-24 ENCOUNTER — Ambulatory Visit (INDEPENDENT_AMBULATORY_CARE_PROVIDER_SITE_OTHER): Admitting: Family Medicine

## 2023-07-24 ENCOUNTER — Encounter: Payer: Self-pay | Admitting: Family Medicine

## 2023-07-24 VITALS — BP 110/70 | HR 102 | Temp 98.0°F | Resp 16 | Ht 66.0 in | Wt 216.0 lb

## 2023-07-24 DIAGNOSIS — G90A Postural orthostatic tachycardia syndrome (POTS): Secondary | ICD-10-CM

## 2023-07-24 MED ORDER — DEXLANSOPRAZOLE 60 MG PO CPDR: 60.0000 mg | DELAYED_RELEASE_CAPSULE | Freq: Every day | ORAL | 3 refills | Status: DC

## 2023-07-24 NOTE — Patient Instructions (Signed)
 Stay hydrated.   You may want to wear the compression stockings until you are rolling into bed.   Try to use your recumbent bike at least 3 times per week, 30 + minutes ( you may have to build yourself up to this).   Let us  know if you need anything.

## 2023-07-24 NOTE — Progress Notes (Signed)
 Chief Complaint  Patient presents with   Loss of Consciousness    Pre-Syncopy     Subjective: Patient is a 43 y.o. female here for a pre-syncope.  Pt felt lightheaded and dizzy on 07/09/2023.  She was crouching down in the pantry area.  BS was normal. She ate 2 hrs prior to the episode. HR spiked up 40-50 BPM from her baseline during the episode. Dx'd w POTS. She does wear compression stockings but had taken them off at that time. Failed BB's in the past. She stays hydrated. She does recumbent exercise.   Past Medical History:  Diagnosis Date   Anxiety    Asthma    Depression    Diabetes (HCC)    GERD (gastroesophageal reflux disease)    History of chicken pox    History of migraine headaches    Migraines    Pituitary adenoma (HCC)    Seizures (HCC)     Objective: BP 110/70 (BP Location: Left Arm, Patient Position: Sitting)   Pulse (!) 102   Temp 98 F (36.7 C) (Oral)   Resp 16   Ht 5\' 6"  (1.676 m)   Wt 216 lb (98 kg)   LMP 06/01/2023 (Exact Date)   SpO2 98%   BMI 34.86 kg/m  General: Awake, appears stated age Heart: RRR, no LE edema Lungs: CTAB, no rales, wheezes or rhonchi. No accessory muscle use Neuro: DTRs equal and symmetric throughout, no clonus, no cerebellar signs, gait is normal, 5/5 strength throughout Mouth: MMM Psych: Age appropriate judgment and insight, normal affect and mood  Assessment and Plan: POTS (postural orthostatic tachycardia syndrome)  Please wear compression stockings more routinely and later into the day.  Stay hydrated.  Beta-blocker is not an option.  Appreciate electrophysiology input.  Recommended more robust recumbent exercise.  Follow-up as originally scheduled. The patient voiced understanding and agreement to the plan.  Shellie Dials [place], DO 07/24/23  3:58 PM

## 2023-07-24 NOTE — Addendum Note (Signed)
 Addended by: Diantha Fossa on: 07/24/2023 11:22 AM   Modules accepted: Orders

## 2023-07-24 NOTE — Telephone Encounter (Addendum)
 Patient aware that Dexilant was sent to [facility] by [facility].  Patient advised that 13 pages of records faxed to [facility]- Dr Elvan Hamel.  Fax confirmation received.  Patient agreed to plan and verbalized understanding.  No further questions.

## 2023-07-25 ENCOUNTER — Encounter: Payer: Self-pay | Admitting: Physician Assistant

## 2023-07-25 ENCOUNTER — Ambulatory Visit (INDEPENDENT_AMBULATORY_CARE_PROVIDER_SITE_OTHER): Admitting: Physician Assistant

## 2023-07-25 ENCOUNTER — Encounter: Payer: Self-pay | Admitting: Hematology & Oncology

## 2023-07-25 DIAGNOSIS — M79641 Pain in right hand: Secondary | ICD-10-CM

## 2023-07-25 DIAGNOSIS — M7989 Other specified soft tissue disorders: Secondary | ICD-10-CM | POA: Insufficient documentation

## 2023-07-25 DIAGNOSIS — M79642 Pain in left hand: Secondary | ICD-10-CM | POA: Diagnosis not present

## 2023-07-25 NOTE — Addendum Note (Signed)
 Addended by: Georgann Kim on: 07/25/2023 03:31 PM   Modules accepted: Orders

## 2023-07-25 NOTE — Progress Notes (Signed)
 Office Visit Note   Patient: Amber [name]           Date of Birth: 1980/10/29           MRN: [id number] Visit Date: 07/25/2023              Requested by: Jobe Mulder, DO [address] [place],  [place] [address] PCP: Jobe Mulder, DO   Assessment & Plan: Visit Diagnoses:  1. Pain in right hand   2. Pain in left hand   3. Bilateral hand swelling     Plan: Patient is a 43 year old woman with a 5-year history of bilateral hand swelling in the mornings.  She denies any injuries.  She has had x-rays in the past did not demonstrate any fractures or abnormalities.  She actually has lost quite a bit of weight recently however she still gets pain and swelling in her hands most prevalent in the morning.  She has multiple allergies and has been recently diagnosed with POTS syndrome exam today is is benign.  Certainly she has had lower extremity swelling from her POTS could also be doing the same in her hands.  Because this is bothersome to her and she will not be seeing a physician to care for her POTS syndrome until July I have offered to send her to [facility] for a couple visits he could suggest some compression gloves and perhaps give her some ideas on things to help with the swelling first thing in the morning may follow-up with me as needed  Follow-Up Instructions: Return if symptoms worsen or fail to improve.   Orders:  Orders Placed This Encounter  Procedures   Ambulatory referral to Physical Therapy   No orders of the defined types were placed in this encounter.     Procedures: No procedures performed   Clinical Data: No additional findings.   Subjective: No chief complaint on file.   HPI patient is a pleasant 43 year old woman who comes in with a 5-year history of increasing swelling and pain in her hands first thing in the morning.  She has tested for inflammatory arthropathies which is negative.  She was recently diagnosed with POTS  syndrome and she is to see somebody for this on Wednesday.  She does wear compression socks on her lower legs because of this.  Does not really have a lot of pain throughout the rest of the day.  Has recently lost quite a bit of weight she has multiple drug allergies  Review of Systems  All other systems reviewed and are negative.    Objective: Vital Signs: LMP 06/01/2023 (Exact Date)   Physical Exam Constitutional:      Appearance: Normal appearance.  Pulmonary:     Effort: Pulmonary effort is normal.  Skin:    General: Skin is warm and dry.  Neurological:     General: No focal deficit present.     Mental Status: She is alert and oriented to person, place, and time.  Psychiatric:        Mood and Affect: Mood normal.        Behavior: Behavior normal.     Ortho Exam Bilateral hands no discoloration she has strong pulses no swelling she has good strength with resisted extension flexion of her wrist abduction and adduction of her fingers.  No particular tenderness to palpation at the time.  Negative Tinel's sign  Specialty Comments:  No specialty comments available.  Imaging: No results found.  PMFS History: Patient Active Problem List   Diagnosis Date Noted   Bilateral hand swelling 07/25/2023   Palpitations 03/13/2023   Paroxysmal tachycardia (HCC) 03/13/2023   Mixed hyperlipidemia 02/14/2023   Iron  deficiency anemia 01/13/2023   Type 2 diabetes mellitus with hyperglycemia, without long-term current use of insulin  (HCC) 12/28/2022   Asthma 03/23/2021   Hypertriglyceridemia 11/10/2020   Myofascial pain 05/07/2020   Multiple closed fractures of ribs of right side 05/07/2020   Numbness of toes 05/07/2020   Prediabetes 04/28/2020   Pituitary microadenoma (HCC) 04/28/2020   Prolactinoma (HCC) 04/28/2020   Pre-diabetes 12/25/2019   Infertility counseling 12/24/2019   Well woman exam 12/24/2019   Hyperglycemia 06/05/2019   Insomnia 01/07/2019   Bilateral hand pain  11/09/2018   Agoraphobia 01/18/2018   Anxiety with depression 03/24/2017   Migraine 05/11/2016   Pituitary adenoma (HCC) 03/05/2016   Hyperprolactinemia (HCC) 03/02/2016   Past Medical History:  Diagnosis Date   Anxiety    Asthma    Depression    Diabetes (HCC)    GERD (gastroesophageal reflux disease)    History of chicken pox    History of migraine headaches    Migraines    Pituitary adenoma (HCC)    Seizures (HCC)     Family History  Problem Relation Age of Onset   Asthma Mother    Diabetes Mother    Migraines Mother    Hyperlipidemia Mother    Bipolar disorder Mother    Irritable bowel syndrome Mother    Asthma Father    Diabetes Father    Colon cancer Father        dx at age 3   Hyperlipidemia Father    Hypertension Father    Migraines Sister    Bipolar disorder Brother    Other Neg Hx        pituitary disorder   Esophageal cancer Neg Hx     Past Surgical History:  Procedure Laterality Date   APPENDECTOMY     CHOLECYSTECTOMY     ESOPHAGEAL MANOMETRY N/A 07/12/2023   Procedure: MANOMETRY, ESOPHAGUS;  Surgeon: Annis Kinder, DO;  Location: [facility];  Service: Gastroenterology;  Laterality: N/A;   PH IMPEDANCE STUDY N/A 07/12/2023   Procedure: IMPEDANCE PH STUDY, ESOPHAGUS;  Surgeon: Annis Kinder, DO;  Location: [facility];  Service: Gastroenterology;  Laterality: N/A;   Pituiary Tumor     Tumor Removal Pituitary   Social History   Occupational History   Occupation: unemployed  Tobacco Use   Smoking status: Former    Current packs/day: 0.00    Average packs/day: 1 pack/day for 15.0 years (15.0 ttl pk-yrs)    Types: Cigarettes    Start date: 01/23/2001    Quit date: 01/24/2016    Years since quitting: 7.5   Smokeless tobacco: Never  Vaping Use   Vaping status: Former  Substance and Sexual Activity   Alcohol use: No   Drug use: No   Sexual activity: Yes    Birth control/protection: None

## 2023-07-26 ENCOUNTER — Inpatient Hospital Stay: Attending: Medical Oncology

## 2023-07-26 ENCOUNTER — Encounter: Payer: Self-pay | Admitting: Family Medicine

## 2023-07-26 ENCOUNTER — Other Ambulatory Visit: Payer: Self-pay | Admitting: Neurology

## 2023-07-26 ENCOUNTER — Inpatient Hospital Stay (HOSPITAL_BASED_OUTPATIENT_CLINIC_OR_DEPARTMENT_OTHER): Admitting: Medical Oncology

## 2023-07-26 ENCOUNTER — Inpatient Hospital Stay

## 2023-07-26 ENCOUNTER — Encounter: Payer: Self-pay | Admitting: Medical Oncology

## 2023-07-26 VITALS — BP 87/56 | HR 92 | Temp 98.3°F | Resp 18 | Ht 66.0 in | Wt 213.0 lb

## 2023-07-26 DIAGNOSIS — E639 Nutritional deficiency, unspecified: Secondary | ICD-10-CM | POA: Diagnosis not present

## 2023-07-26 DIAGNOSIS — D509 Iron deficiency anemia, unspecified: Secondary | ICD-10-CM | POA: Insufficient documentation

## 2023-07-26 DIAGNOSIS — R7989 Other specified abnormal findings of blood chemistry: Secondary | ICD-10-CM | POA: Diagnosis not present

## 2023-07-26 DIAGNOSIS — F418 Other specified anxiety disorders: Secondary | ICD-10-CM

## 2023-07-26 DIAGNOSIS — E1165 Type 2 diabetes mellitus with hyperglycemia: Secondary | ICD-10-CM

## 2023-07-26 DIAGNOSIS — K909 Intestinal malabsorption, unspecified: Secondary | ICD-10-CM | POA: Diagnosis not present

## 2023-07-26 DIAGNOSIS — E538 Deficiency of other specified B group vitamins: Secondary | ICD-10-CM | POA: Diagnosis present

## 2023-07-26 LAB — CBC WITH DIFFERENTIAL (CANCER CENTER ONLY)
Abs Immature Granulocytes: 0.04 10*3/uL (ref 0.00–0.07)
Basophils Absolute: 0 10*3/uL (ref 0.0–0.1)
Basophils Relative: 0 %
Eosinophils Absolute: 0.1 10*3/uL (ref 0.0–0.5)
Eosinophils Relative: 1 %
HCT: 37.1 % (ref 36.0–46.0)
Hemoglobin: 12.9 g/dL (ref 12.0–15.0)
Immature Granulocytes: 0 %
Lymphocytes Relative: 29 %
Lymphs Abs: 2.9 10*3/uL (ref 0.7–4.0)
MCH: 29.7 pg (ref 26.0–34.0)
MCHC: 34.8 g/dL (ref 30.0–36.0)
MCV: 85.5 fL (ref 80.0–100.0)
Monocytes Absolute: 0.7 10*3/uL (ref 0.1–1.0)
Monocytes Relative: 7 %
Neutro Abs: 6.2 10*3/uL (ref 1.7–7.7)
Neutrophils Relative %: 63 %
Platelet Count: 390 10*3/uL (ref 150–400)
RBC: 4.34 MIL/uL (ref 3.87–5.11)
RDW: 13.7 % (ref 11.5–15.5)
WBC Count: 10 10*3/uL (ref 4.0–10.5)
nRBC: 0 % (ref 0.0–0.2)

## 2023-07-26 LAB — RETIC PANEL
Immature Retic Fract: 10.7 % (ref 2.3–15.9)
RBC.: 4.38 MIL/uL (ref 3.87–5.11)
Retic Count, Absolute: 69.6 10*3/uL (ref 19.0–186.0)
Retic Ct Pct: 1.6 % (ref 0.4–3.1)
Reticulocyte Hemoglobin: 33.8 pg (ref >27.9)

## 2023-07-26 LAB — FERRITIN: Ferritin: 81 ng/mL (ref 11–307)

## 2023-07-26 LAB — VITAMIN B12: Vitamin B-12: 208 pg/mL (ref 180–914)

## 2023-07-26 MED ORDER — CYANOCOBALAMIN 1000 MCG/ML IJ SOLN
1000.0000 ug | Freq: Once | INTRAMUSCULAR | Status: AC
  Administered 2023-07-26: 1000 ug via INTRAMUSCULAR
  Filled 2023-07-26: qty 1

## 2023-07-26 NOTE — Progress Notes (Signed)
 Hematology and Oncology Follow Up Visit  Amber [name] [id number] 07-Sep-1980 43 y.o. 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 GI evaluation was recommended given her family history  (colon cancer of father). She was also start on IV venofer  and is S/P one cycle of treatment.   She also has a history of Mast Cell Activation Syndrome. She is seeing a rheumatologist and immunologist. She is now on zyrtec and pepcid . Feeling a bit better overall. Still fatigued   Still struggling with her POTS syndrome.   She reports tolerating the IV iron  well without side effects however she is having more and more reactions to foods/items. She has had multiple anaphylactic reactions so IV iron  was put on hold given risks. She was recently started on XOLAIR  about 2 months ago to help with her MCAS. It will take about 6 months to have full effect. At her last appointment we discussed trailing a freeze dried organ meat supplement vs Accufer.   Today she states that she has been ok overall. She has not had any episodes of anaphylaxis since our last visit.   She is tolerating B12 injections well.  There has been no bleeding to her knowledge aside from her regular menstrual  cycles  On Mounjaro  for weight loss which she has been on since October.   Wt Readings from Last 3 Encounters:  07/26/23 213 lb (96.6 kg)  07/24/23 216 lb (98 kg)  06/28/23 226 lb 3.2 oz (102.6 kg)     Medications:   Current Outpatient Medications:    acetaminophen  (TYLENOL ) 500 MG tablet, Take 500 mg by mouth as needed., Disp: , Rfl:    cabergoline  (DOSTINEX ) 0.5 MG tablet, 2.5 mg weekly (Two tabs on Mondays, 1 tab on wednesdays and 2 tabs on Fridays), Disp: 65 tablet, Rfl: 3   cetirizine (ZYRTEC) 10 MG chewable tablet, Chew 10 mg by mouth in the morning and at bedtime., Disp: , Rfl:    Continuous Glucose Sensor (DEXCOM G7 SENSOR) MISC, USE 1 SENSOR TOPICALLY EVERY 10 DAYS, Disp: 1 each, Rfl: 11   dexlansoprazole (DEXILANT) 60 MG capsule, Take 1 capsule (60 mg total) by mouth daily., Disp: 90 capsule, Rfl: 3   Erenumab -aooe (AIMOVIG ) 140 MG/ML SOAJ, Inject 140 mg as directed subcutaneously once a month, Disp: 1 mL, Rfl: 7   escitalopram  (LEXAPRO ) 10 MG tablet, Take 1 tablet (10 mg total) by mouth daily., Disp: 90 tablet, Rfl: 3   famotidine  (PEPCID ) 20 MG tablet, Take 20 mg by mouth 2 (two) times daily., Disp: , Rfl:    fenofibrate  (TRICOR ) 145 MG tablet, Take 1 tablet (145 mg total) by mouth daily., Disp: 90 tablet,  Rfl: 3   folic acid  (FOLVITE ) 1 MG tablet, Take 1 tablet (1 mg total) by mouth daily., Disp: 30 tablet, Rfl: 6   gabapentin (NEURONTIN) 300 MG capsule, Take 1 capsule by mouth at bedtime., Disp: , Rfl:    glucose blood (ACCU-CHEK GUIDE) test strip, 1 each by Other route daily in the afternoon. Use as instructed, Disp: 100 each, Rfl: 3   ipratropium (ATROVENT ) 0.06 % nasal spray, Place 2 sprays into both nostrils 4 (four) times daily., Disp: 15 mL, Rfl: 12   Menaquinone-7 (VITAMIN K2) 100 MCG CAPS, Take 1 Dose by mouth daily., Disp: , Rfl:    omalizumab  (XOLAIR ) 150 MG/ML prefilled syringe, Inject 300 mg into the skin every 28 (twenty-eight) days., Disp: 6 mL, Rfl: 3    ondansetron  (ZOFRAN ) 8 MG tablet, Take 1 tablet (8 mg total) by mouth as needed for nausea or vomiting., Disp: 60 tablet, Rfl: 1   QUEtiapine  (SEROQUEL ) 300 MG tablet, Take 1 tablet (300 mg total) by mouth at bedtime., Disp: 90 tablet, Rfl: 3   Riboflavin 400 MG CAPS, Take 400 mg by mouth daily., Disp: , Rfl:    Rimegepant Sulfate (NURTEC) 75 MG TBDP, Take 1 tablet (75 mg total) by mouth as needed (Daily as needed for a Migraine). Maximum 1 tablet in 24 hours.  Quantity 8., Disp: 8 tablet, Rfl: 5   rosuvastatin  (CRESTOR ) 20 MG tablet, Take 1 tablet (20 mg total) by mouth daily., Disp: 90 tablet, Rfl: 3   tirzepatide  (MOUNJARO ) 15 MG/0.5ML Pen, Inject 15 mg into the skin once a week., Disp: 2 mL, Rfl: 2   topiramate  (TOPAMAX ) 100 MG tablet, Take 1 tablet (100 mg total) by mouth at bedtime., Disp: 30 tablet, Rfl: 5   traMADol  (ULTRAM ) 50 MG tablet, Take 1 tablet (50 mg total) by mouth every 6 (six) hours as needed., Disp: 15 tablet, Rfl: 2   EPINEPHrine  (EPIPEN  2-PAK) 0.3 mg/0.3 mL IJ SOAJ injection, Inject 0.3 mg into the muscle as needed for anaphylaxis. (Patient not taking: Reported on 07/26/2023), Disp: 1 each, Rfl: 2  Allergies:  Allergies  Allergen Reactions   Bee Venom Anaphylaxis   Chlorhexidine Hives, Itching and Rash   Ibuprofen Other (See Comments)    GI bleed   Iodine Hives, Itching and Rash   Peanut-Containing Drug Products Other (See Comments)    Pt reports unknown   Sesame Seed (Diagnostic) Anaphylaxis   Shellfish Allergy  Shortness Of Breath, Swelling and Other (See Comments)    Tingling in the lips, mouth and throat   Soybean-Containing Drug Products Anaphylaxis   Sulfa Antibiotics Anaphylaxis   Triptans Other (See Comments)    Makes pain worse. Pain in joints   Lunesta  [Eszopiclone ] Nausea And Vomiting    GI issues   Almond (Diagnostic) Other (See Comments)    Unknown per pt   Aspirin Other (See Comments)    UNKNOWN REACTION. Her parents and siblings are allergic to  Aspirin.   Cashew Nut (Anacardium Occidentale) Skin Test Other (See Comments)    Unknown per pt   Hazelnut (Filbert) Other (See Comments)    Unknown per pt   Nsaids Nausea And Vomiting   Tape Rash    Blisters    Walnut Other (See Comments)    Unknown per pt   Wheat Nausea And Vomiting    Past Medical History, Surgical history, Social history, and Family History were reviewed and updated.  Review of Systems: As stated above in HPI  Physical Exam:  height  is 5\' 6"  (1.676 m) and weight is 213 lb (96.6 kg). Her oral temperature is 98.3 F (36.8 C). Her blood pressure is 87/56 (abnormal) and her pulse is 92. Her respiration is 18 and oxygen saturation is 100%.   Physical Exam General: NAD Cardiovascular: regular rate and rhythm Pulmonary: clear ant fields Abdomen: soft, nontender, + bowel sounds GU: no suprapubic tenderness Extremities: no edema, no joint deformities Skin: no rashes Neurological: Weakness but otherwise nonfocal   Lab Results  Component Value Date   WBC 10.0 07/26/2023   HGB 12.9 07/26/2023   HCT 37.1 07/26/2023   MCV 85.5 07/26/2023   PLT 390 07/26/2023     Chemistry      Component Value Date/Time   NA 140 06/26/2023 1415   K 3.4 (L) 06/26/2023 1415   CL 107 06/26/2023 1415   CO2 23 06/26/2023 1415   BUN 9 06/26/2023 1415   CREATININE 1.05 (H) 06/26/2023 1415   CREATININE 0.99 12/24/2019 1316      Component Value Date/Time   CALCIUM  9.7 06/26/2023 1415   ALKPHOS 30 (L) 06/26/2023 1415   AST 27 06/26/2023 1415   ALT 31 06/26/2023 1415   BILITOT 0.5 06/26/2023 1415     Encounter Diagnoses  Name Primary?   Iron  deficiency anemia, unspecified iron  deficiency anemia type Yes   Low vitamin B12 level    Nutritional deficiency    Assessment and Plan- Patient is a 43 y.o. female with multifactorial anemia. She has IDA secondary to malabsorption(GERD/PPI use/ MCAS) and from chronic loss (menstrual cycles). She also has a history of nutritional  deficiencies.   Continue monthly B12 injections.  Thrombocytosis has resolved  Disposition: B12 today RTC 1 month APP, labs (CBC w/, retic, iron , ferritin, B12), B12   Sunnie England PA-C 5/7/20252:46 PM

## 2023-07-26 NOTE — Patient Instructions (Signed)
 Vitamin B12 Injection What is this medication? Vitamin B12 (VAHY tuh min B12) prevents and treats low vitamin B12 levels in your body. It is used in people who do not get enough vitamin B12 from their diet or when their digestive tract does not absorb enough. Vitamin B12 plays an important role in maintaining the health of your nervous system and red blood cells. This medicine may be used for other purposes; ask your health care provider or pharmacist if you have questions. COMMON BRAND NAME(S): B-12 Compliance Kit, B-12 Injection Kit, Cyomin, Dodex, LA-12, Nutri-Twelve, Physicians EZ Use B-12, Primabalt, Vitamin Deficiency Injectable System - B12 What should I tell my care team before I take this medication? They need to know if you have any of these conditions: Kidney disease Leber's disease Megaloblastic anemia An unusual or allergic reaction to cyanocobalamin, cobalt, other medications, foods, dyes, or preservatives Pregnant or trying to get pregnant Breast-feeding How should I use this medication? This medication is injected into a muscle or deeply under the skin. It is usually given in a clinic or care team's office. However, your care team may teach you how to inject yourself. Follow all instructions. Talk to your care team about the use of this medication in children. Special care may be needed. Overdosage: If you think you have taken too much of this medicine contact a poison control center or emergency room at once. NOTE: This medicine is only for you. Do not share this medicine with others. What if I miss a dose? If you are given your dose at a clinic or care team's office, call to reschedule your appointment. If you give your own injections, and you miss a dose, take it as soon as you can. If it is almost time for your next dose, take only that dose. Do not take double or extra doses. What may interact with this medication? Alcohol Colchicine This list may not describe all possible  interactions. Give your health care provider a list of all the medicines, herbs, non-prescription drugs, or dietary supplements you use. Also tell them if you smoke, drink alcohol, or use illegal drugs. Some items may interact with your medicine. What should I watch for while using this medication? Visit your care team regularly. You may need blood work done while you are taking this medication. You may need to follow a special diet. Talk to your care team. Limit your alcohol intake and avoid smoking to get the best benefit. What side effects may I notice from receiving this medication? Side effects that you should report to your care team as soon as possible: Allergic reactions--skin rash, itching, hives, swelling of the face, lips, tongue, or throat Swelling of the ankles, hands, or feet Trouble breathing Side effects that usually do not require medical attention (report to your care team if they continue or are bothersome): Diarrhea This list may not describe all possible side effects. Call your doctor for medical advice about side effects. You may report side effects to FDA at 1-800-FDA-1088. Where should I keep my medication? Keep out of the reach of children. Store at room temperature between 15 and 30 degrees C (59 and 85 degrees F). Protect from light. Throw away any unused medication after the expiration date. NOTE: This sheet is a summary. It may not cover all possible information. If you have questions about this medicine, talk to your doctor, pharmacist, or health care provider.  2024 Elsevier/Gold Standard (2020-11-17 00:00:00)

## 2023-07-27 LAB — IRON AND IRON BINDING CAPACITY (CC-WL,HP ONLY)
Iron: 57 ug/dL (ref 28–170)
Saturation Ratios: 13 % (ref 10.4–31.8)
TIBC: 433 ug/dL (ref 250–450)
UIBC: 376 ug/dL (ref 148–442)

## 2023-07-27 MED ORDER — ROSUVASTATIN CALCIUM 20 MG PO TABS: 20.0000 mg | ORAL_TABLET | Freq: Every day | ORAL | 3 refills | Status: DC

## 2023-07-27 MED ORDER — DEXCOM G7 SENSOR MISC: 11 refills | Status: DC

## 2023-07-27 MED ORDER — ESCITALOPRAM OXALATE 10 MG PO TABS: 10.0000 mg | ORAL_TABLET | Freq: Every day | ORAL | 3 refills | Status: DC

## 2023-07-27 MED ORDER — TIRZEPATIDE 15 MG/0.5ML ~~LOC~~ SOAJ
15.0000 mg | SUBCUTANEOUS | 2 refills | Status: AC
Start: 2023-07-27 — End: ?

## 2023-07-31 ENCOUNTER — Encounter: Payer: Self-pay | Admitting: Medical Oncology

## 2023-08-02 NOTE — Therapy (Signed)
 OUTPATIENT OCCUPATIONAL THERAPY ORTHO EVALUATION  Patient Name: Amber [name] MRN: [id number] DOB:12/10/1980, 43 y.o., female Today's Date: 08/03/2023  PCP: Peterson Brandt DO REFERRING PROVIDER: Persons, Norma Beckers, [place]   END OF SESSION:  OT End of Session - 08/03/23 1516     Visit Number 1    Number of Visits 8    Date for OT Re-Evaluation 09/01/23    Authorization Type [facility]    OT Start Time 1516    OT Stop Time 1558    OT Time Calculation (min) 42 min    Activity Tolerance No increased pain;Patient limited by pain;Patient tolerated treatment well    Behavior During Therapy WFL for tasks assessed/performed             Past Medical History:  Diagnosis Date   Anxiety    Asthma    Depression    Diabetes (HCC)    GERD (gastroesophageal reflux disease)    History of chicken pox    History of migraine headaches    Migraines    Pituitary adenoma (HCC)    Seizures (HCC)    Past Surgical History:  Procedure Laterality Date   APPENDECTOMY     CHOLECYSTECTOMY     ESOPHAGEAL MANOMETRY N/A 07/12/2023   Procedure: MANOMETRY, ESOPHAGUS;  Surgeon: Annis Kinder, DO;  Location: [facility];  Service: Gastroenterology;  Laterality: N/A;   PH IMPEDANCE STUDY N/A 07/12/2023   Procedure: IMPEDANCE PH STUDY, ESOPHAGUS;  Surgeon: Annis Kinder, DO;  Location: [facility];  Service: Gastroenterology;  Laterality: N/A;   Pituiary Tumor     Tumor Removal Pituitary   Patient Active Problem List   Diagnosis Date Noted   Bilateral hand swelling 07/25/2023   Palpitations 03/13/2023   Paroxysmal tachycardia (HCC) 03/13/2023   Mixed hyperlipidemia 02/14/2023   Iron  deficiency anemia 01/13/2023   Type 2 diabetes mellitus with hyperglycemia, without long-term current use of insulin  (HCC) 12/28/2022   Asthma 03/23/2021   Hypertriglyceridemia 11/10/2020   Myofascial pain 05/07/2020   Multiple closed fractures of ribs of right side 05/07/2020   Numbness of toes 05/07/2020    Prediabetes 04/28/2020   Pituitary microadenoma (HCC) 04/28/2020   Prolactinoma (HCC) 04/28/2020   Pre-diabetes 12/25/2019   Infertility counseling 12/24/2019   Well woman exam 12/24/2019   Hyperglycemia 06/05/2019   Insomnia 01/07/2019   Bilateral hand pain 11/09/2018   Agoraphobia 01/18/2018   Anxiety with depression 03/24/2017   Migraine 05/11/2016   Pituitary adenoma (HCC) 03/05/2016   Hyperprolactinemia (HCC) 03/02/2016    ONSET DATE: chronic (~5 years or more)   REFERRING DIAG:  M79.641 (ICD-10-CM) - Pain in right hand  M79.642 (ICD-10-CM) - Pain in left hand  M79.89 (ICD-10-CM) - Bilateral hand swelling    THERAPY DIAG:  Pain in right hand  Pain in left hand  Rationale for Evaluation and Treatment: Rehabilitation  SUBJECTIVE:   SUBJECTIVE STATEMENT: She states several recent health issues like POTS, MCAS and now EDS.   She also had Lt CuTS release 8 weeks ago and now has less numbness.  She is here mainly for bil hand swelling and pain that is likely linked to EDS. This has been a problem for her for several years     PERTINENT HISTORY: Evaluate and Treat for swelling of the hands with Nate; per provider visit: "5-year history of bilateral hand swelling in the mornings. She denies any injuries. She has had x-rays in the past did not demonstrate any fractures or abnormalities. She actually has  lost quite a bit of weight recently however she still gets pain and swelling in her hands most prevalent in the morning. She has multiple allergies and has been recently diagnosed with POTS syndrome exam today is is benign. Certainly she has had lower extremity swelling from her POTS could also be doing the same in her hands. "  PRECAUTIONS: Other: POTS syndrome and general precautions for this  RED FLAGS: None   WEIGHT BEARING RESTRICTIONS: No  PAIN:  Are you having pain? Yes: NPRS scale: 4/10 at rest, at worst in the past week up to 7/10 Pain location: Bilateral  hands IP joints Pain description: Aching and sore Aggravating factors: Excessive use Relieving factors: N/A  FALLS: Has patient fallen in last 6 months? Yes. Number of falls 6 or more due to pots syndrome, this is now managed with medications /medical recommendations and should hopefully not affect us  in therapy  LIVING ENVIRONMENT: Lives with: lives with their family (she states she is a caregiver for her husband) Lives in: House/apartment  PLOF: Independent  PATIENT GOALS: Improve pain symptoms and functional use with bilateral hands  NEXT MD VISIT: As needed   OBJECTIVE: (All objective assessments below are from initial evaluation on: 08/03/23 unless otherwise specified.)   HAND DOMINANCE: Right   ADLs: Overall ADLs: States decreased ability to grab, hold household objects, pain and difficulty to open containers, perform FMS tasks (manipulate fasteners on clothing)   FUNCTIONAL OUTCOME MEASURES: Eval: Patient Specific Functional Scale: 4 (grip home objects, fine motor skills, hold cups and tumblers)  (Higher Score  =  Better Ability for the Selected Tasks)       UPPER EXTREMITY ROM     Shoulder to Wrist AROM Right eval Left eval  Wrist flexion 85 78  Wrist extension 80 82  Wrist ulnar deviation    Wrist radial deviation    Functional dart thrower's motion (F-DTM) in ulnar flexion    F-DTM in radial extension     (Blank rows = not tested)   Hand AROM Right eval Left eval  Full Fist Ability (or Gap to Distal Palmar Crease) Full fist Full fist  Thumb Opposition  (Kapandji Scale)  10/10 10/10  (Blank rows = not tested)   HAND FUNCTION: Eval: Observed weakness in affected bilateral hands, details TBD in the next session as needed Grip strength Right: TBD lbs, Left: TBD lbs   COORDINATION: Eval: No significant coordination deficits noted today, just pain symptoms  SENSATION: Eval: Light touch intact though still slightly diminished in ulnar nerve distribution  of the left arm after recent cubital tunnel release  EDEMA:   Eval:  Mildly swollen in bilateral hands  COGNITION: Eval: Overall cognitive status: WFL for evaluation today   OBSERVATIONS:   Eval:   Lt hand IF most sore today, TTP at PIP J mainly, nont at DIP J and slightly at MCP J.   Full AROM, opposition, but tender/painful    TODAY'S TREATMENT:  Post-evaluation treatment:  She was given self-care/safety recommendations for management of hand arthritis as listed under patient instructions.  These were each reviewed with her with her stating understanding but may need reviewed in the future as well.  OT also educates to try to avoid hyperextended positions due to EDS.  She should try to maintain in normal cascade of the hand when using her hand for activities or resting.  Next, she was given the following home exercise program to work on 2 or 3 times a day-preferably she  should do them first thing in the morning after using warm water in the sink for 3 to 5 minutes.  Each of these was done with her and she performs them back with no significant pain feeling a bit looser.   Exercises - Wrist Prayer Stretch  - 2-3 x daily - 3 reps - 15 sec hold - BACK KNUCKLE STRETCHES   - 4 x daily - 3-5 reps - 15 sec hold - HOOK Stretch  - 4 x daily - 3-5 reps - 15-20 sec hold - Seated Finger Composite Flexion Stretch  - 4 x daily - 3-5 reps - 15 hold - Towel Roll Grip with Forearm in Neutral  - 3 x daily - 5 reps - 10 sec hold  PATIENT EDUCATION: Education details: See tx section above for details  Person educated: Patient Education method: Verbal Instruction, Teach back, Handouts  Education comprehension: States and demonstrates understanding, Additional Education required    HOME EXERCISE PROGRAM: Access Code: [email or web address] URL: [email or web address] Date: 08/03/2023 Prepared by: Leartis Proud   GOALS: Goals reviewed with patient? Yes   SHORT TERM GOALS: (STG required if  POC>30 days) Target Date: 08/11/2023  Pt will demo/state understanding of initial HEP to improve pain levels and prerequisite motion. Goal status: INITIAL   LONG TERM GOALS: Target Date: 09/01/2023  Pt will improve functional ability by decreased impairment per PSFS assessment from 4 to 6 or better, for better quality of life. Goal status: INITIAL  2.   Pt will decrease pain at rest from 4/10 to 1/10 or better to have better sleep and occupational participation in daily roles. Goal status: INITIAL  3.  PT will increase grip strength in bilateral hands from TBD to TBD for improved eyes/ADL use.  Goal status: TBD testing next session as needed.  This: May not be needed if pain and function comes up appropriately.   ASSESSMENT:  CLINICAL IMPRESSION: Patient is a 43 y.o. female who was seen today for occupational therapy evaluation for bilateral hand swelling and pain in the IP joints.  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  She will benefit from outpatient occupational therapy to decrease symptoms and increase quality of life.   PERFORMANCE DEFICITS: in functional skills including IADLs, pain, body mechanics, and UE functional use, cognitive skills including problem solving and safety awareness, and psychosocial skills including coping strategies, environmental adaptation, habits, and routines and behaviors.   IMPAIRMENTS: are limiting patient from IADLs and leisure.   COMORBIDITIES: has co-morbidities such as POTS, MCAS, EDS, anxiety, and others that affects occupational performance. Patient will benefit from skilled OT to address above impairments and improve overall function.  MODIFICATION OR ASSISTANCE TO COMPLETE EVALUATION: No modification of tasks or assist necessary to complete an evaluation.  OT OCCUPATIONAL PROFILE AND HISTORY: Detailed assessment: Review of records and additional  review of physical, cognitive, psychosocial history related to current functional performance.  CLINICAL DECISION MAKING: LOW - limited treatment options, no task modification necessary  REHAB POTENTIAL: Good  EVALUATION COMPLEXITY: Low      PLAN:  OT FREQUENCY: 1-2x/week  OT DURATION: 4 weeks through 09/01/2023 and up to 8 total visits as needed  PLANNED INTERVENTIONS: 97535 self care/ADL training, 56213 therapeutic exercise, 97530 therapeutic activity, 97112 neuromuscular re-education, 97140 manual therapy, 97760 Orthotic Initial, 97763 Orthotic/Prosthetic subsequent, compression bandaging, energy conservation, coping strategies training, patient/family education, and DME and/or AE instructions  RECOMMENDED OTHER SERVICES: None now  CONSULTED AND AGREED WITH PLAN OF CARE: Patient  PLAN FOR NEXT SESSION:   Check initial recommendations and home exercise program, address any additional needs as appropriate   Leartis Proud, OTR/L, CHT 08/03/2023, 5:04 PM

## 2023-08-03 ENCOUNTER — Ambulatory Visit (INDEPENDENT_AMBULATORY_CARE_PROVIDER_SITE_OTHER): Admitting: Rehabilitative and Restorative Service Providers"

## 2023-08-03 ENCOUNTER — Encounter: Payer: Self-pay | Admitting: Rehabilitative and Restorative Service Providers"

## 2023-08-03 DIAGNOSIS — M79642 Pain in left hand: Secondary | ICD-10-CM

## 2023-08-03 DIAGNOSIS — M79641 Pain in right hand: Secondary | ICD-10-CM | POA: Diagnosis not present

## 2023-08-03 NOTE — Patient Instructions (Signed)

## 2023-08-06 ENCOUNTER — Encounter (HOSPITAL_BASED_OUTPATIENT_CLINIC_OR_DEPARTMENT_OTHER): Payer: Self-pay | Admitting: Emergency Medicine

## 2023-08-06 ENCOUNTER — Other Ambulatory Visit: Payer: Self-pay

## 2023-08-06 ENCOUNTER — Emergency Department (HOSPITAL_BASED_OUTPATIENT_CLINIC_OR_DEPARTMENT_OTHER)
Admission: EM | Admit: 2023-08-06 | Discharge: 2023-08-06 | Disposition: A | Discharge status: Home / Self Care | Attending: Emergency Medicine | Admitting: Emergency Medicine

## 2023-08-06 DIAGNOSIS — Z87891 Personal history of nicotine dependence: Secondary | ICD-10-CM | POA: Insufficient documentation

## 2023-08-06 DIAGNOSIS — Z91010 Allergy to peanuts: Secondary | ICD-10-CM | POA: Insufficient documentation

## 2023-08-06 DIAGNOSIS — R519 Headache, unspecified: Secondary | ICD-10-CM | POA: Diagnosis present

## 2023-08-06 DIAGNOSIS — G43909 Migraine, unspecified, not intractable, without status migrainosus: Secondary | ICD-10-CM | POA: Insufficient documentation

## 2023-08-06 MED ORDER — DIPHENHYDRAMINE HCL 50 MG/ML IJ SOLN
12.5000 mg | Freq: Once | INTRAMUSCULAR | Status: AC
  Administered 2023-08-06: 12.5 mg via INTRAVENOUS
  Filled 2023-08-06: qty 1

## 2023-08-06 MED ORDER — METOCLOPRAMIDE HCL 5 MG/ML IJ SOLN
10.0000 mg | Freq: Once | INTRAMUSCULAR | Status: AC
  Administered 2023-08-06: 10 mg via INTRAVENOUS
  Filled 2023-08-06: qty 2

## 2023-08-06 MED ORDER — SODIUM CHLORIDE 0.9 % IV BOLUS
1000.0000 mL | Freq: Once | INTRAVENOUS | Status: AC
  Administered 2023-08-06: 1000 mL via INTRAVENOUS

## 2023-08-06 MED ORDER — HYDROMORPHONE HCL 1 MG/ML IJ SOLN
0.5000 mg | Freq: Once | INTRAMUSCULAR | Status: AC
  Administered 2023-08-06: 0.5 mg via INTRAVENOUS
  Filled 2023-08-06: qty 1

## 2023-08-06 MED ORDER — DEXAMETHASONE SODIUM PHOSPHATE 10 MG/ML IJ SOLN
10.0000 mg | Freq: Once | INTRAMUSCULAR | Status: AC
  Administered 2023-08-06: 10 mg via INTRAVENOUS
  Filled 2023-08-06: qty 1

## 2023-08-06 NOTE — ED Provider Notes (Signed)
 [facility] Provider Note   CSN: [id number] Arrival date & time: 08/06/23  1913     History  Chief Complaint  Patient presents with   Migraine    Amber [name] is a 43 y.o. female.  Patient to ED with headache that is typical of her migraines. She is follow by neurology (Dr. Festus Hubert) and is being tried on a new medication. She also used her rescue medication, Nurtec, which did not abort the headache. No fever, SOB, chest pain, unilateral weakness, visual change. She also reports history of POTS and has not been able to drink fluids, so is concerned it will exacerbate her hypotension.   The history is provided by the patient. No language interpreter was used.  Migraine       Home Medications Prior to Admission medications   Medication Sig Start Date End Date Taking? Authorizing Provider  acetaminophen  (TYLENOL ) 500 MG tablet Take 500 mg by mouth as needed.    [provider]  cabergoline  (DOSTINEX ) 0.5 MG tablet 2.5 mg weekly (Two tabs on Mondays, 1 tab on wednesdays and 2 tabs on Fridays) 01/09/23   Shamleffer, Ibtehal Jaralla, MD  cetirizine (ZYRTEC) 10 MG chewable tablet Chew 10 mg by mouth in the morning and at bedtime.    [provider]  Continuous Glucose Sensor (DEXCOM G7 SENSOR) MISC USE 1 SENSOR TOPICALLY EVERY 10 DAYS 07/27/23   Jobe Mulder, DO  dexlansoprazole  (DEXILANT ) 60 MG capsule Take 1 capsule (60 mg total) by mouth daily. 07/24/23   Cirigliano, Vito V, DO  EPINEPHrine  (EPIPEN  2-PAK) 0.3 mg/0.3 mL IJ SOAJ injection Inject 0.3 mg into the muscle as needed for anaphylaxis. Patient not taking: Reported on 07/26/2023 06/14/23   Jobe Mulder, DO  Erenumab -aooe (AIMOVIG ) 140 MG/ML SOAJ Inject 140 mg as directed subcutaneously once a month 07/04/23   Merriam Abbey, DO  escitalopram  (LEXAPRO ) 10 MG tablet Take 1 tablet (10 mg total) by mouth daily. 07/27/23   Jobe Mulder, DO   famotidine  (PEPCID ) 20 MG tablet Take 20 mg by mouth 2 (two) times daily.    [provider]  fenofibrate  (TRICOR ) 145 MG tablet Take 1 tablet (145 mg total) by mouth daily. 05/22/23   Jobe Mulder, DO  folic acid  (FOLVITE ) 1 MG tablet Take 1 tablet (1 mg total) by mouth daily. 05/23/23   Sharla Davis, PA-C  gabapentin (NEURONTIN) 300 MG capsule Take 1 capsule by mouth at bedtime. 01/25/23   [provider]  glucose blood (ACCU-CHEK GUIDE) test strip 1 each by Other route daily in the afternoon. Use as instructed 01/06/23   Shamleffer, Ibtehal Jaralla, MD  ipratropium (ATROVENT ) 0.06 % nasal spray Place 2 sprays into both nostrils 4 (four) times daily. 05/09/23   Orelia Binet, MD  Menaquinone-7 (VITAMIN K2) 100 MCG CAPS Take 1 Dose by mouth daily.    [provider]  omalizumab  (XOLAIR ) 150 MG/ML prefilled syringe Inject 300 mg into the skin every 28 (twenty-eight) days. 06/22/23   Orelia Binet, MD  ondansetron  (ZOFRAN ) 8 MG tablet Take 1 tablet (8 mg total) by mouth as needed for nausea or vomiting. 01/27/23   Merriam Abbey, DO  QUEtiapine  (SEROQUEL ) 300 MG tablet Take 1 tablet (300 mg total) by mouth at bedtime. 05/22/23   Jobe Mulder, DO  Riboflavin 400 MG CAPS Take 400 mg by mouth daily.    [provider]  Rimegepant Sulfate (NURTEC) 75 MG TBDP  Take 1 tablet (75 mg total) by mouth as needed (Daily as needed for a Migraine). Maximum 1 tablet in 24 hours.  Quantity 8. 06/12/23   Merriam Abbey, DO  rosuvastatin  (CRESTOR ) 20 MG tablet Take 1 tablet (20 mg total) by mouth daily. 07/27/23   Jobe Mulder, DO  tirzepatide  (MOUNJARO ) 15 MG/0.5ML Pen Inject 15 mg into the skin once a week. 07/27/23   Jobe Mulder, DO  topiramate  (TOPAMAX ) 100 MG tablet Take 1 tablet (100 mg total) by mouth at bedtime. 06/12/23   Merriam Abbey, DO  traMADol  (ULTRAM ) 50 MG tablet TAKE ONE TABLET BY MOUTH EVERY 6 HOURS AS NEEDED 07/28/23   Merriam Abbey, DO      Allergies    Bee venom, Chlorhexidine, Ibuprofen, Iodine, Peanut-containing drug products, Sesame seed (diagnostic), Shellfish allergy , Soybean-containing drug products, Sulfa antibiotics, Triptans, Lunesta  [eszopiclone ], Almond (diagnostic), Aspirin, Cashew nut (anacardium occidentale) skin test, Hazelnut (filbert), Nsaids, Tape, Walnut, and Wheat    Review of Systems   Review of Systems  Physical Exam Updated Vital Signs BP 107/73 (BP Location: Right Arm)   Pulse 98   Temp 98.4 F (36.9 C)   Resp 18   Ht 5\' 6"  (1.676 m)   Wt 96.6 kg   SpO2 100%   BMI 34.38 kg/m  Physical Exam Vitals and nursing note reviewed.  Constitutional:      Appearance: She is well-developed.  HENT:     Head: Normocephalic.  Cardiovascular:     Rate and Rhythm: Normal rate.  Pulmonary:     Effort: Pulmonary effort is normal.  Musculoskeletal:        General: Normal range of motion.     Cervical back: Normal range of motion and neck supple.  Skin:    General: Skin is warm and dry.  Neurological:     General: No focal deficit present.     Mental Status: She is alert and oriented to person, place, and time.     GCS: GCS eye subscore is 4. GCS verbal subscore is 5. GCS motor subscore is 6.     Cranial Nerves: No cranial nerve deficit.     Sensory: Sensation is intact.     Motor: No weakness or pronator drift.     Coordination: Finger-Nose-Finger Test normal.     Gait: Gait normal.     ED Results / Procedures / Treatments   Labs (all labs ordered are listed, but only abnormal results are displayed) Labs Reviewed - No data to display  EKG None  Radiology No results found.  Procedures Procedures    Medications Ordered in ED Medications  sodium chloride  0.9 % bolus 1,000 mL (0 mLs Intravenous Stopped 08/06/23 2123)  metoCLOPramide  (REGLAN ) injection 10 mg (10 mg Intravenous Given 08/06/23 2007)  dexamethasone  (DECADRON ) injection 10 mg (10 mg Intravenous Given 08/06/23  2006)  HYDROmorphone  (DILAUDID ) injection 0.5 mg (0.5 mg Intravenous Given 08/06/23 2006)  diphenhydrAMINE  (BENADRYL ) injection 12.5 mg (12.5 mg Intravenous Given 08/06/23 2006)    ED Course/ Medical Decision Making/ A&P                                 Medical Decision Making This patient presents to the ED for concern of headach3, this involves an extensive number of treatment options, and is a complaint that carries with it a high risk of complications and morbidity.  The differential diagnosis includes  migraine, bleed, mass, fracture, infection/abscess   Co morbidities that complicate the patient evaluation  History of migraines   Additional history obtained:  Additional history and/or information obtained from chart review, notable for multiple ED visits for migraine headaches   Lab Tests:  I Ordered, and personally interpreted labs.  The pertinent results include:  n/a    Imaging Studies ordered:  I ordered imaging studies including n/a I independently visualized and interpreted imaging which showed n/a I agree with the radiologist interpretation   Cardiac Monitoring:  The patient was maintained on a cardiac monitor.  I personally viewed and interpreted the cardiac monitored which showed an underlying rhythm of: n/a   Medicines ordered and prescription drug management:  I ordered medication including Reglan , Benadryl , Decadron , Dilaudid   for typical migraine Reevaluation of the patient after these medicines showed that the patient resolved I have reviewed the patients home medicines and have made adjustments as needed   Test Considered:  N/a   Critical Interventions:  N/a   Consultations Obtained:  I requested consultation with the n/a,  and discussed lab and imaging findings as well as pertinent plan - they recommend: n/a   Problem List / ED Course:  Here with headache typical for migraine Well established with neurology, Dr. Festus Hubert, with regular  contact. Normal neurologic exam, VSS, afebrile Headache cocktail provided with fluids with resolution of symptoms.    Reevaluation:  After the interventions noted above, I reevaluated the patient and found that they have :resolved   Social Determinants of Health:  Former smoker   Disposition:  After consideration of the diagnostic results and the patients response to treatment, I feel that the patient would benefit from discharge home.   Risk Prescription drug management.           Final Clinical Impression(s) / ED Diagnoses Final diagnoses:  Migraine without status migrainosus, not intractable, unspecified migraine type    Rx / DC Orders ED Discharge Orders     None         Rama Burkitt 08/06/23 2125    Lowery Rue, DO 08/06/23 2155

## 2023-08-06 NOTE — ED Triage Notes (Signed)
 Pt with migraine since 0400; home meds not helping

## 2023-08-06 NOTE — Discharge Instructions (Signed)
 Follow up as planned with Dr. Festus Hubert.

## 2023-08-16 ENCOUNTER — Ambulatory Visit (INDEPENDENT_AMBULATORY_CARE_PROVIDER_SITE_OTHER)

## 2023-08-16 ENCOUNTER — Encounter: Payer: Self-pay | Admitting: Hematology & Oncology

## 2023-08-16 DIAGNOSIS — L501 Idiopathic urticaria: Secondary | ICD-10-CM

## 2023-08-16 MED ORDER — OMALIZUMAB 150 MG/ML ~~LOC~~ SOSY
150.0000 mg | PREFILLED_SYRINGE | Freq: Once | SUBCUTANEOUS | Status: AC
  Administered 2023-08-16: 150 mg via SUBCUTANEOUS

## 2023-08-17 ENCOUNTER — Telehealth: Payer: Self-pay | Admitting: Gastroenterology

## 2023-08-17 NOTE — Telephone Encounter (Signed)
 Inbound call from Rice Chamorro at [facility] with Dr. Elvan Hamel office requesting full report of esophageal manometry. Dorien Garibaldi that I would send a note over so that report can be received and faxed over. Rice Chamorro stated patient is in office and would need report sent now. Spoke with Arlyce Berger and was advised Dr. Nandigam would have to pull up the report so we are able to receive a copy to fax over. Rice Chamorro then asked why was she was not advised of this at the beginning of call. Advised that I was just advised by our Nursing Manager and would send a note to Dr. Leonia Raman so when she returns in office she will make the report available so it can be faxed over for Dr. Elvan Hamel to review. Then requested to speak with Nursing Manager, advised I can transfer her over but may get voicemail due manager just stepping out of the office. Then asked to speak with Print production planner but advised she is currently not in office but she will be followed up with as soon as possible. Rice Chamorro then disconnected the call.

## 2023-08-18 ENCOUNTER — Encounter: Payer: Self-pay | Admitting: Family Medicine

## 2023-08-18 ENCOUNTER — Ambulatory Visit (INDEPENDENT_AMBULATORY_CARE_PROVIDER_SITE_OTHER): Admitting: Family Medicine

## 2023-08-18 VITALS — BP 110/64 | HR 102 | Temp 98.0°F | Resp 16 | Ht 66.0 in | Wt 207.8 lb

## 2023-08-18 DIAGNOSIS — M357 Hypermobility syndrome: Secondary | ICD-10-CM

## 2023-08-18 DIAGNOSIS — M25511 Pain in right shoulder: Secondary | ICD-10-CM

## 2023-08-18 NOTE — Progress Notes (Signed)
 Musculoskeletal Exam  Patient: Amber [name] DOB: Jan 13, 1981  DOS: 08/18/2023  SUBJECTIVE:  Chief Complaint:   Chief Complaint  Patient presents with   Hip Injury    Right Hip and Right Shoulder    Amber [name] is a 43 y.o.  female for evaluation and treatment of R hip and shoulder pain.   Onset:  several decades ago. No inj or change in activity.  Location: R shoulder and R hip Character:  sharp  Progression of issue:  has worsened since she has been losing weight Associated symptoms: joint instability and "popping out" of joint and back in Treatment: to date has been none.   Neurovascular symptoms: no Requesting referral to PM&R thru [facility].   Past Medical History:  Diagnosis Date   Anxiety    Asthma    Depression    Diabetes (HCC)    GERD (gastroesophageal reflux disease)    History of chicken pox    History of migraine headaches    Migraines    Pituitary adenoma (HCC)    Seizures (HCC)     Objective: VITAL SIGNS: BP 110/64 (BP Location: Left Arm, Patient Position: Sitting)   Pulse (!) 102   Temp 98 F (36.7 C) (Oral)   Resp 16   Ht 5\' 6"  (1.676 m)   Wt 207 lb 12.8 oz (94.3 kg)   SpO2 99%   BMI 33.54 kg/m  Constitutional: Well formed, well developed. No acute distress. Thorax & Lungs: No accessory muscle use Musculoskeletal: R hip/shoulder.   Normal active range of motion: no.   Normal passive range of motion: no Tenderness to palpation: yes over lateral prox deltoid Deformity: no Ecchymosis: no Tests positive: Empty can  Tests negative: Stinchfield, logroll, FABER, FADDIER, Neer's, Hawkins, Speed's, lift off, cross over Neurologic: Normal sensory function. No focal deficits noted. DTR's equal and symmetric in LE's. No clonus. Gait nml.  Psychiatric: Normal mood. Age appropriate judgment and insight. Alert & oriented x 3.    Assessment:  Musculoskeletal hypermobility - Plan: Ambulatory referral to Physical Medicine Rehab  Acute pain of  right shoulder  Plan: Stretches/exercises for shoulder, heat, ice, Tylenol . Refer to Dr. Alonna Art at her request for possible dx of EDS.  F/u as originally scheduled. The patient voiced understanding and agreement to the plan.   Amber [name] [place], DO 08/18/23  3:14 PM

## 2023-08-18 NOTE — Patient Instructions (Addendum)
If you do not hear anything about your referral in the next 1-2 weeks, call our office and ask for an update.  Heat (pad or rice pillow in microwave) over affected area, 10-15 minutes twice daily.   Ice/cold pack over area for 10-15 min twice daily.  OK to take Tylenol 1000 mg (2 extra strength tabs) or 975 mg (3 regular strength tabs) every 6 hours as needed.  Let us know if you need anything.  EXERCISES  RANGE OF MOTION (ROM) AND STRETCHING EXERCISES These exercises may help you when beginning to rehabilitate your injury. While completing these exercises, remember:  Restoring tissue flexibility helps normal motion to return to the joints. This allows healthier, less painful movement and activity. An effective stretch should be held for at least 30 seconds. A stretch should never be painful. You should only feel a gentle lengthening or release in the stretched tissue.  ROM - Pendulum Bend at the waist so that your right / left arm falls away from your body. Support yourself with your opposite hand on a solid surface, such as a table or a countertop. Your right / left arm should be perpendicular to the ground. If it is not perpendicular, you need to lean over farther. Relax the muscles in your right / left arm and shoulder as much as possible. Gently sway your hips and trunk so they move your right / left arm without any use of your right / left shoulder muscles. Progress your movements so that your right / left arm moves side to side, then forward and backward, and finally, both clockwise and counterclockwise. Complete 10-15 repetitions in each direction. Many people use this exercise to relieve discomfort in their shoulder as well as to gain range of motion. Repeat 2 times. Complete this exercise 3 times per week.  STRETCH - Flexion, Standing Stand with good posture. With an underhand grip on your right / left hand and an overhand grip on the opposite hand, grasp a broomstick or cane so  that your hands are a little more than shoulder-width apart. Keeping your right / left elbow straight and shoulder muscles relaxed, push the stick with your opposite hand to raise your right / left arm in front of your body and then overhead. Raise your arm until you feel a stretch in your right / left shoulder, but before you have increased shoulder pain. Try to avoid shrugging your right / left shoulder as your arm rises by keeping your shoulder blade tucked down and toward your mid-back spine. Hold 30 seconds. Slowly return to the starting position. Repeat 2 times. Complete this exercise 3 times per week.  STRETCH - Internal Rotation Place your right / left hand behind your back, palm-up. Throw a towel or belt over your opposite shoulder. Grasp the towel/belt with your right / left hand. While keeping an upright posture, gently pull up on the towel/belt until you feel a stretch in the front of your right / left shoulder. Avoid shrugging your right / left shoulder as your arm rises by keeping your shoulder blade tucked down and toward your mid-back spine. Hold 30. Release the stretch by lowering your opposite hand. Repeat 2 times. Complete this exercise 3 times per week.  STRETCH - External Rotation and Abduction Stagger your stance through a doorframe. It does not matter which foot is forward. As instructed by your physician, physical therapist or athletic trainer, place your hands: And forearms above your head and on the door frame. And  forearms at head-height and on the door frame. At elbow-height and on the door frame. Keeping your head and chest upright and your stomach muscles tight to prevent over-extending your low-back, slowly shift your weight onto your front foot until you feel a stretch across your chest and/or in the front of your shoulders. Hold 30 seconds. Shift your weight to your back foot to release the stretch. Repeat 2 times. Complete this stretch 3 times per week.    STRENGTHENING EXERCISES  These exercises may help you when beginning to rehabilitate your injury. They may resolve your symptoms with or without further involvement from your physician, physical therapist or athletic trainer. While completing these exercises, remember:  Muscles can gain both the endurance and the strength needed for everyday activities through controlled exercises. Complete these exercises as instructed by your physician, physical therapist or athletic trainer. Progress the resistance and repetitions only as guided. You may experience muscle soreness or fatigue, but the pain or discomfort you are trying to eliminate should never worsen during these exercises. If this pain does worsen, stop and make certain you are following the directions exactly. If the pain is still present after adjustments, discontinue the exercise until you can discuss the trouble with your clinician. If advised by your physician, during your recovery, avoid activity or exercises which involve actions that place your right / left hand or elbow above your head or behind your back or head. These positions stress the tissues which are trying to heal.  STRENGTH - Scapular Depression and Adduction With good posture, sit on a firm chair. Supported your arms in front of you with pillows, arm rests or a table top. Have your elbows in line with the sides of your body. Gently draw your shoulder blades down and toward your mid-back spine. Gradually increase the tension without tensing the muscles along the top of your shoulders and the back of your neck. Hold for 3 seconds. Slowly release the tension and relax your muscles completely before completing the next repetition. After you have practiced this exercise, remove the arm support and complete it in standing as well as sitting. Repeat 2 times. Complete this exercise 3 times per week.   STRENGTH - External Rotators Secure a rubber exercise band/tubing to a fixed  object so that it is at the same height as your right / left elbow when you are standing or sitting on a firm surface. Stand or sit so that the secured exercise band/tubing is at your side that is not injured. Bend your elbow 90 degrees. Place a folded towel or small pillow under your right / left arm so that your elbow is a few inches away from your side. Keeping the tension on the exercise band/tubing, pull it away from your body, as if pivoting on your elbow. Be sure to keep your body steady so that the movement is only coming from your shoulder rotating. Hold 3 seconds. Release the tension in a controlled manner as you return to the starting position. Repeat 2 times. Complete this exercise 3 times per week.   STRENGTH - Supraspinatus Stand or sit with good posture. Grasp a 2-3 lb weight or an exercise band/tubing so that your hand is "thumbs-up," like when you shake hands. Slowly lift your right / left hand from your thigh into the air, traveling about 30 degrees from straight out at your side. Lift your hand to shoulder height or as far as you can without increasing any shoulder pain. Initially, many  people do not lift their hands above shoulder height. Avoid shrugging your right / left shoulder as your arm rises by keeping your shoulder blade tucked down and toward your mid-back spine. Hold for 3 seconds. Control the descent of your hand as you slowly return to your starting position. Repeat 2 times. Complete this exercise 3 times per week.   STRENGTH - Shoulder Extensors Secure a rubber exercise band/tubing so that it is at the height of your shoulders when you are either standing or sitting on a firm arm-less chair. With a thumbs-up grip, grasp an end of the band/tubing in each hand. Straighten your elbows and lift your hands straight in front of you at shoulder height. Step back away from the secured end of band/tubing until it becomes tense. Squeezing your shoulder blades together, pull  your hands down to the sides of your thighs. Do not allow your hands to go behind you. Hold for 3 seconds. Slowly ease the tension on the band/tubing as you reverse the directions and return to the starting position. Repeat 2 times. Complete this exercise 3 times per week.   STRENGTH - Scapular Retractors Secure a rubber exercise band/tubing so that it is at the height of your shoulders when you are either standing or sitting on a firm arm-less chair. With a palm-down grip, grasp an end of the band/tubing in each hand. Straighten your elbows and lift your hands straight in front of you at shoulder height. Step back away from the secured end of band/tubing until it becomes tense. Squeezing your shoulder blades together, draw your elbows back as you bend them. Keep your upper arm lifted away from your body throughout the exercise. Hold 3 seconds. Slowly ease the tension on the band/tubing as you reverse the directions and return to the starting position. Repeat 2 times. Complete this exercise 3 times per week.  STRENGTH - Scapular Depressors Find a sturdy chair without wheels, such as a from a dining room table. Keeping your feet on the floor, lift your bottom from the seat and lock your elbows. Keeping your elbows straight, allow gravity to pull your body weight down. Your shoulders will rise toward your ears. Raise your body against gravity by drawing your shoulder blades down your back, shortening the distance between your shoulders and ears. Although your feet should always maintain contact with the floor, your feet should progressively support less body weight as you get stronger. Hold 3 seconds. In a controlled and slow manner, lower your body weight to begin the next repetition. Repeat 2 times. Complete this exercise 3 times per week.    This information is not intended to replace advice given to you by your health care provider. Make sure you discuss any questions you have with your health  care provider.   Document Released: 01/19/2005 Document Revised: 03/28/2014 Document Reviewed: 06/19/2008 Elsevier Interactive Patient Education [employer]

## 2023-08-22 NOTE — Telephone Encounter (Signed)
 Late entry for 08/21/23 1:00 pm  I spoke with Rice Chamorro at [facility].  She has everything she needs for the patient and received a copy of the report.  She thanked me for calling and following up.  She will call back if she needs any additional information.

## 2023-08-23 ENCOUNTER — Encounter: Admitting: Rehabilitative and Restorative Service Providers"

## 2023-08-24 ENCOUNTER — Telehealth: Payer: Self-pay

## 2023-08-24 NOTE — Telephone Encounter (Signed)
-----   Message from Annis Kinder sent at 08/23/2023  5:02 PM EDT ----- This patient was seen by Dr. Elvan Hamel but now meets guideline criteria for TIF as well.  If she would like to discuss TIF for her reflux instead, please schedule OV with me to discuss in further detail.  Thanks. ----- Message ----- From: Aldean Hummingbird, MD Sent: 08/23/2023  12:16 PM EDT To: Harry Lindau V, DO  Yes I think that is reasonable.   She has a lot of medical stuff going on and was going to research traditional lap/robotic fundoplication   So I think it is reasonable to offer TIF and see what her thoughts are.   She may ultimately choose nothing.   w ----- Message ----- From: Annis Kinder, DO Sent: 08/23/2023  12:02 PM EDT To: Aldean Hummingbird, MD  Her BMI was >40 when I met her and started her workup, so I thought she would be more a Roux-en-Y gastric bypass candidate.  I now see from your note that her BMI is way down to 33, presumably with Mounjaro .  In that case, I suppose I could offer her TIF.  Want me to reach back out to her and see if she is interested in TIF now instead?  Vito ----- Message ----- From: Aldean Hummingbird, MD Sent: 08/17/2023   5:05 PM EDT To: Annis Kinder, DO  Saw her today  Just curious - why not a TIF candidate?  Thanks w

## 2023-08-24 NOTE — Telephone Encounter (Signed)
 Spoke with patient to offer her an appointment date and time with Dr Karene Oto to further discuss TIF.  Patient would like to do more research before scheduling an appointment.  Patient stated she will contact our office once she is ready to schedule an appointment.

## 2023-08-25 ENCOUNTER — Telehealth: Payer: Self-pay

## 2023-08-25 NOTE — Telephone Encounter (Signed)
 Called [facility] letting her know referral for [facility] have been trying to reach her. Ask her to give them a call back.

## 2023-08-28 ENCOUNTER — Institutional Professional Consult (permissible substitution): Admitting: Cardiology

## 2023-08-30 ENCOUNTER — Inpatient Hospital Stay

## 2023-08-30 ENCOUNTER — Encounter: Payer: Self-pay | Admitting: Medical Oncology

## 2023-08-30 ENCOUNTER — Inpatient Hospital Stay (HOSPITAL_BASED_OUTPATIENT_CLINIC_OR_DEPARTMENT_OTHER): Admitting: Medical Oncology

## 2023-08-30 ENCOUNTER — Inpatient Hospital Stay: Attending: Medical Oncology

## 2023-08-30 ENCOUNTER — Encounter: Admitting: Rehabilitative and Restorative Service Providers"

## 2023-08-30 VITALS — BP 98/57 | HR 90 | Temp 99.0°F | Resp 18 | Ht 66.0 in | Wt 207.1 lb

## 2023-08-30 DIAGNOSIS — R7989 Other specified abnormal findings of blood chemistry: Secondary | ICD-10-CM | POA: Diagnosis not present

## 2023-08-30 DIAGNOSIS — E639 Nutritional deficiency, unspecified: Secondary | ICD-10-CM | POA: Diagnosis not present

## 2023-08-30 DIAGNOSIS — D509 Iron deficiency anemia, unspecified: Secondary | ICD-10-CM

## 2023-08-30 DIAGNOSIS — E538 Deficiency of other specified B group vitamins: Secondary | ICD-10-CM | POA: Diagnosis not present

## 2023-08-30 LAB — VITAMIN D 25 HYDROXY (VIT D DEFICIENCY, FRACTURES): Vit D, 25-Hydroxy: 17.07 ng/mL — ABNORMAL LOW (ref 30–100)

## 2023-08-30 LAB — CBC WITH DIFFERENTIAL (CANCER CENTER ONLY)
Abs Immature Granulocytes: 0.04 10*3/uL (ref 0.00–0.07)
Basophils Absolute: 0.1 10*3/uL (ref 0.0–0.1)
Basophils Relative: 1 %
Eosinophils Absolute: 0.2 10*3/uL (ref 0.0–0.5)
Eosinophils Relative: 2 %
HCT: 38.7 % (ref 36.0–46.0)
Hemoglobin: 13.2 g/dL (ref 12.0–15.0)
Immature Granulocytes: 1 %
Lymphocytes Relative: 36 %
Lymphs Abs: 3.1 10*3/uL (ref 0.7–4.0)
MCH: 29.5 pg (ref 26.0–34.0)
MCHC: 34.1 g/dL (ref 30.0–36.0)
MCV: 86.6 fL (ref 80.0–100.0)
Monocytes Absolute: 0.6 10*3/uL (ref 0.1–1.0)
Monocytes Relative: 7 %
Neutro Abs: 4.8 10*3/uL (ref 1.7–7.7)
Neutrophils Relative %: 53 %
Platelet Count: 373 10*3/uL (ref 150–400)
RBC: 4.47 MIL/uL (ref 3.87–5.11)
RDW: 14.2 % (ref 11.5–15.5)
WBC Count: 8.8 10*3/uL (ref 4.0–10.5)
nRBC: 0 % (ref 0.0–0.2)

## 2023-08-30 LAB — RETIC PANEL
Immature Retic Fract: 8.8 % (ref 2.3–15.9)
RBC.: 4.51 MIL/uL (ref 3.87–5.11)
Retic Count, Absolute: 67.7 10*3/uL (ref 19.0–186.0)
Retic Ct Pct: 1.5 % (ref 0.4–3.1)
Reticulocyte Hemoglobin: 33.1 pg (ref >27.9)

## 2023-08-30 LAB — FOLATE: Folate: 12.9 ng/mL (ref >5.9)

## 2023-08-30 LAB — FERRITIN: Ferritin: 44 ng/mL (ref 11–307)

## 2023-08-30 LAB — VITAMIN B12: Vitamin B-12: 254 pg/mL (ref 180–914)

## 2023-08-30 MED ORDER — CYANOCOBALAMIN 1000 MCG/ML IJ SOLN
1000.0000 ug | Freq: Once | INTRAMUSCULAR | Status: AC
  Administered 2023-08-30: 1000 ug via INTRAMUSCULAR
  Filled 2023-08-30: qty 1

## 2023-08-30 NOTE — Patient Instructions (Signed)
 Vitamin B12 Injection What is this medication? Vitamin B12 (VAHY tuh min B12) prevents and treats low vitamin B12 levels in your body. It is used in people who do not get enough vitamin B12 from their diet or when their digestive tract does not absorb enough. Vitamin B12 plays an important role in maintaining the health of your nervous system and red blood cells. This medicine may be used for other purposes; ask your health care provider or pharmacist if you have questions. COMMON BRAND NAME(S): B-12 Compliance Kit, B-12 Injection Kit, Cyomin, Dodex, LA-12, Nutri-Twelve, Physicians EZ Use B-12, Primabalt, Vitamin Deficiency Injectable System - B12 What should I tell my care team before I take this medication? They need to know if you have any of these conditions: Kidney disease Leber's disease Megaloblastic anemia An unusual or allergic reaction to cyanocobalamin, cobalt, other medications, foods, dyes, or preservatives Pregnant or trying to get pregnant Breast-feeding How should I use this medication? This medication is injected into a muscle or deeply under the skin. It is usually given in a clinic or care team's office. However, your care team may teach you how to inject yourself. Follow all instructions. Talk to your care team about the use of this medication in children. Special care may be needed. Overdosage: If you think you have taken too much of this medicine contact a poison control center or emergency room at once. NOTE: This medicine is only for you. Do not share this medicine with others. What if I miss a dose? If you are given your dose at a clinic or care team's office, call to reschedule your appointment. If you give your own injections, and you miss a dose, take it as soon as you can. If it is almost time for your next dose, take only that dose. Do not take double or extra doses. What may interact with this medication? Alcohol Colchicine This list may not describe all possible  interactions. Give your health care provider a list of all the medicines, herbs, non-prescription drugs, or dietary supplements you use. Also tell them if you smoke, drink alcohol, or use illegal drugs. Some items may interact with your medicine. What should I watch for while using this medication? Visit your care team regularly. You may need blood work done while you are taking this medication. You may need to follow a special diet. Talk to your care team. Limit your alcohol intake and avoid smoking to get the best benefit. What side effects may I notice from receiving this medication? Side effects that you should report to your care team as soon as possible: Allergic reactions--skin rash, itching, hives, swelling of the face, lips, tongue, or throat Swelling of the ankles, hands, or feet Trouble breathing Side effects that usually do not require medical attention (report to your care team if they continue or are bothersome): Diarrhea This list may not describe all possible side effects. Call your doctor for medical advice about side effects. You may report side effects to FDA at 1-800-FDA-1088. Where should I keep my medication? Keep out of the reach of children. Store at room temperature between 15 and 30 degrees C (59 and 85 degrees F). Protect from light. Throw away any unused medication after the expiration date. NOTE: This sheet is a summary. It may not cover all possible information. If you have questions about this medicine, talk to your doctor, pharmacist, or health care provider.  2024 Elsevier/Gold Standard (2020-11-17 00:00:00)

## 2023-08-30 NOTE — Progress Notes (Signed)
 Hematology and Oncology Follow Up Visit  Amber [name] [id number] [birth year] 43 y.o. 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 GI evaluation was recommended given her family history  (colon cancer of father). She was also start on IV venofer  and is S/P one cycle of treatment.   She also has a history of Mast Cell Activation Syndrome. She is seeing a rheumatologist and immunologist. She is now on zyrtec and pepcid . Feeling a bit better overall.    She reports tolerating the IV iron  well without side effects however she is having more and more reactions to foods/items. She has had multiple anaphylactic reactions so IV iron  was put on hold given risks. She started XOLAIR  about 3 months ago to help with her MCAS. It will take about 6 months to have full effect.   Today she reports that she has been ok overall. She has not had any episodes of anaphylaxis since our last visit.   She goes to [facility] in December to see an EDS specialist.   She is tolerating B12 injections well.  There has been no bleeding to her knowledge aside from her regular menstrual cycles  On Mounjaro  for weight loss which she has been on  since October.   Wt Readings from Last 3 Encounters:  08/30/23 207 lb 1.9 oz (93.9 kg)  08/18/23 207 lb 12.8 oz (94.3 kg)  08/06/23 213 lb (96.6 kg)     Medications:   Current Outpatient Medications:    acetaminophen  (TYLENOL ) 500 MG tablet, Take 500 mg by mouth as needed., Disp: , Rfl:    cabergoline  (DOSTINEX ) 0.5 MG tablet, 2.5 mg weekly (Two tabs on Mondays, 1 tab on wednesdays and 2 tabs on Fridays), Disp: 65 tablet, Rfl: 3   cetirizine (ZYRTEC) 10 MG chewable tablet, Chew 10 mg by mouth in the morning and at bedtime., Disp: , Rfl:    Continuous Glucose Sensor (DEXCOM G7 SENSOR) MISC, USE 1 SENSOR TOPICALLY EVERY 10 DAYS, Disp: 1 each, Rfl: 11   dexlansoprazole  (DEXILANT ) 60 MG capsule, Take 1 capsule (60 mg total) by mouth daily., Disp: 90 capsule, Rfl: 3   EPINEPHrine  (EPIPEN  2-PAK) 0.3 mg/0.3 mL IJ SOAJ injection, Inject 0.3 mg into the muscle as needed for anaphylaxis., Disp: 1 each, Rfl: 2   Erenumab -aooe (AIMOVIG ) 140 MG/ML SOAJ, Inject 140 mg as directed subcutaneously once a month, Disp: 1 mL, Rfl: 7   escitalopram  (LEXAPRO ) 10 MG tablet, Take 1 tablet (10 mg total) by mouth daily., Disp: 90 tablet, Rfl: 3   famotidine  (PEPCID ) 20 MG tablet, Take 20 mg by mouth 2 (two) times daily., Disp: ,  Rfl:    fenofibrate  (TRICOR ) 145 MG tablet, Take 1 tablet (145 mg total) by mouth daily., Disp: 90 tablet, Rfl: 3   folic acid  (FOLVITE ) 1 MG tablet, Take 1 tablet (1 mg total) by mouth daily., Disp: 30 tablet, Rfl: 6   gabapentin (NEURONTIN) 300 MG capsule, Take 1 capsule by mouth at bedtime., Disp: , Rfl:    glucose blood (ACCU-CHEK GUIDE) test strip, 1 each by Other route daily in the afternoon. Use as instructed, Disp: 100 each, Rfl: 3   ipratropium (ATROVENT ) 0.06 % nasal spray, Place 2 sprays into both nostrils 4 (four) times daily., Disp: 15 mL, Rfl: 12   Menaquinone-7 (VITAMIN K2) 100 MCG CAPS, Take 1 Dose by mouth daily., Disp: , Rfl:    omalizumab  (XOLAIR ) 150 MG/ML prefilled  syringe, Inject 300 mg into the skin every 28 (twenty-eight) days., Disp: 6 mL, Rfl: 3   ondansetron  (ZOFRAN ) 8 MG tablet, Take 1 tablet (8 mg total) by mouth as needed for nausea or vomiting., Disp: 60 tablet, Rfl: 1   QUEtiapine  (SEROQUEL ) 300 MG tablet, Take 1 tablet (300 mg total) by mouth at bedtime., Disp: 90 tablet, Rfl: 3   Riboflavin 400 MG CAPS, Take 400 mg by mouth daily., Disp: , Rfl:    Rimegepant Sulfate (NURTEC) 75 MG TBDP, Take 1 tablet (75 mg total) by mouth as needed (Daily as needed for a Migraine). Maximum 1 tablet in 24 hours.  Quantity 8., Disp: 8 tablet, Rfl: 5   rosuvastatin  (CRESTOR ) 20 MG tablet, Take 1 tablet (20 mg total) by mouth daily., Disp: 90 tablet, Rfl: 3   tirzepatide  (MOUNJARO ) 15 MG/0.5ML Pen, Inject 15 mg into the skin once a week., Disp: 2 mL, Rfl: 2   topiramate  (TOPAMAX ) 100 MG tablet, Take 1 tablet (100 mg total) by mouth at bedtime., Disp: 30 tablet, Rfl: 5   traMADol  (ULTRAM ) 50 MG tablet, TAKE ONE TABLET BY MOUTH EVERY 6 HOURS AS NEEDED, Disp: 15 tablet, Rfl: 2  Allergies:  Allergies  Allergen Reactions   Bee Venom Anaphylaxis   Chlorhexidine Hives, Itching and Rash   Ibuprofen Other (See Comments)    GI bleed   Iodine Hives, Itching and Rash   Peanut-Containing Drug Products Other (See Comments)    Pt reports unknown   Sesame Seed (Diagnostic) Anaphylaxis   Shellfish Allergy  Shortness Of Breath, Swelling and Other (See Comments)    Tingling in the lips, mouth and throat   Soybean-Containing Drug Products Anaphylaxis   Sulfa Antibiotics Anaphylaxis   Triptans Other (See Comments)    Makes pain worse. Pain in joints   Lunesta  [Eszopiclone ] Nausea And Vomiting    GI issues   Almond (Diagnostic) Other (See Comments)    Unknown per pt   Aspirin Other (See Comments)    UNKNOWN REACTION. Her parents and siblings are allergic to Aspirin.   Cashew Nut (Anacardium Occidentale) Skin Test Other (See Comments)    Unknown per pt   Hazelnut (Filbert)  Other (See Comments)    Unknown per pt   Nsaids Nausea And Vomiting   Tape Rash    Blisters    Walnut Other (See Comments)    Unknown per pt   Wheat Nausea And Vomiting    Past Medical History, Surgical history, Social history, and Family History were reviewed and updated.  Review of Systems: As stated above in HPI  Physical Exam:  height is 5' 6 (1.676 m) and weight is 207 lb 1.9 oz (93.9 kg). Her  oral temperature is 99 F (37.2 C). Her blood pressure is 98/57 (abnormal) and her pulse is 90. Her respiration is 18 and oxygen saturation is 100%.   Physical Exam General: NAD Cardiovascular: regular rate and rhythm Pulmonary: clear ant fields Abdomen: soft, nontender, + bowel sounds GU: no suprapubic tenderness Extremities: no edema, no joint deformities Skin: no rashes Neurological: Weakness but otherwise nonfocal   Lab Results  Component Value Date   WBC 8.8 08/30/2023   HGB 13.2 08/30/2023   HCT 38.7 08/30/2023   MCV 86.6 08/30/2023   PLT 373 08/30/2023     Chemistry      Component Value Date/Time   NA 140 06/26/2023 1415   K 3.4 (L) 06/26/2023 1415   CL 107 06/26/2023 1415   CO2 23 06/26/2023 1415   BUN 9 06/26/2023 1415   CREATININE 1.05 (H) 06/26/2023 1415   CREATININE 0.99 12/24/2019 1316      Component Value Date/Time   CALCIUM  9.7 06/26/2023 1415   ALKPHOS 30 (L) 06/26/2023 1415   AST 27 06/26/2023 1415   ALT 31 06/26/2023 1415   BILITOT 0.5 06/26/2023 1415     Encounter Diagnoses  Name Primary?   Iron  deficiency anemia, unspecified iron  deficiency anemia type Yes   Low vitamin B12 level    Nutritional deficiency    Folate deficiency     Assessment and Plan- Patient is a 43 y.o. female with multifactorial anemia. She has IDA secondary to malabsorption(GERD/PPI use/ MCAS) and from chronic loss (menstrual cycles). She also has a history of nutritional deficiencies including B12 and folate.  She is taking once daily folic acid  1 G along with  monthly B12 injections.   CBC is normal.  Retic count is unremarkable Nutritional labs pending.  Continue monthly B12 injections.    Disposition: B12 today RTC 1 month APP, labs (CBC w/, retic, iron , ferritin, B12), B12   Sunnie England PA-C 6/11/20253:35 PM

## 2023-08-31 ENCOUNTER — Other Ambulatory Visit: Payer: Self-pay | Admitting: Medical Oncology

## 2023-08-31 ENCOUNTER — Ambulatory Visit: Payer: Self-pay | Admitting: Medical Oncology

## 2023-08-31 DIAGNOSIS — E559 Vitamin D deficiency, unspecified: Secondary | ICD-10-CM

## 2023-08-31 LAB — IRON AND IRON BINDING CAPACITY (CC-WL,HP ONLY)
Iron: 43 ug/dL (ref 28–170)
Saturation Ratios: 12 % (ref 10.4–31.8)
TIBC: 346 ug/dL (ref 250–450)
UIBC: 303 ug/dL (ref 148–442)

## 2023-08-31 MED ORDER — SYRINGE/NEEDLE (DISP) 25G X 1" 1 ML MISC: 1.0000 | 0 refills | Status: AC

## 2023-08-31 MED ORDER — CYANOCOBALAMIN 1000 MCG/ML IJ SOLN: 1000.0000 ug | INTRAMUSCULAR | 4 refills | Status: DC

## 2023-09-04 ENCOUNTER — Other Ambulatory Visit: Payer: Self-pay

## 2023-09-04 ENCOUNTER — Emergency Department (HOSPITAL_BASED_OUTPATIENT_CLINIC_OR_DEPARTMENT_OTHER)
Admission: EM | Admit: 2023-09-04 | Discharge: 2023-09-05 | Disposition: A | Discharge status: Home / Self Care | Attending: Emergency Medicine | Admitting: Emergency Medicine

## 2023-09-04 ENCOUNTER — Encounter (HOSPITAL_BASED_OUTPATIENT_CLINIC_OR_DEPARTMENT_OTHER): Payer: Self-pay | Admitting: Emergency Medicine

## 2023-09-04 ENCOUNTER — Emergency Department (HOSPITAL_BASED_OUTPATIENT_CLINIC_OR_DEPARTMENT_OTHER)

## 2023-09-04 DIAGNOSIS — R110 Nausea: Secondary | ICD-10-CM | POA: Diagnosis not present

## 2023-09-04 DIAGNOSIS — E119 Type 2 diabetes mellitus without complications: Secondary | ICD-10-CM | POA: Insufficient documentation

## 2023-09-04 DIAGNOSIS — Z79899 Other long term (current) drug therapy: Secondary | ICD-10-CM | POA: Diagnosis not present

## 2023-09-04 DIAGNOSIS — R0789 Other chest pain: Secondary | ICD-10-CM

## 2023-09-04 DIAGNOSIS — R519 Headache, unspecified: Secondary | ICD-10-CM | POA: Diagnosis present

## 2023-09-04 HISTORY — DX: Postural orthostatic tachycardia syndrome (POTS): G90.A

## 2023-09-04 LAB — CBC
HCT: 41.9 % (ref 36.0–46.0)
Hemoglobin: 14 g/dL (ref 12.0–15.0)
MCH: 29 pg (ref 26.0–34.0)
MCHC: 33.4 g/dL (ref 30.0–36.0)
MCV: 86.7 fL (ref 80.0–100.0)
Platelets: 401 10*3/uL — ABNORMAL HIGH (ref 150–400)
RBC: 4.83 MIL/uL (ref 3.87–5.11)
RDW: 13.6 % (ref 11.5–15.5)
WBC: 9.4 10*3/uL (ref 4.0–10.5)
nRBC: 0 % (ref 0.0–0.2)

## 2023-09-04 LAB — BASIC METABOLIC PANEL WITH GFR
Anion gap: 15 (ref 5–15)
BUN: 14 mg/dL (ref 6–20)
CO2: 19 mmol/L — ABNORMAL LOW (ref 22–32)
Calcium: 9.8 mg/dL (ref 8.9–10.3)
Chloride: 104 mmol/L (ref 98–111)
Creatinine, Ser: 0.92 mg/dL (ref 0.44–1.00)
GFR, Estimated: >60 mL/min (ref >60)
Glucose, Bld: 81 mg/dL (ref 70–99)
Potassium: 3.6 mmol/L (ref 3.5–5.1)
Sodium: 138 mmol/L (ref 135–145)

## 2023-09-04 LAB — TROPONIN T, HIGH SENSITIVITY: Troponin T High Sensitivity: <15 ng/L (ref <19)

## 2023-09-04 NOTE — ED Triage Notes (Signed)
 Pt POV c/o migraine since 1300, 8/10.  C/o elevated HR since waking this AM. C/o chest pain (6/10) x 1.5 hr. Reports hx of POTS, c/o increased elevated HR even compared to her normal, all day.   Poor po intake today, nausea.

## 2023-09-04 NOTE — Therapy (Signed)
 OUTPATIENT OCCUPATIONAL THERAPY TREATMENT & discharge NOTE  Patient Name: Amber [name] MRN: [id number] DOB:[birth year], 43 y.o., female Today's Date: 09/06/2023  PCP: Peterson Brandt DO REFERRING PROVIDER: Persons, Norma Beckers, [place]                 OCCUPATIONAL THERAPY DISCHARGE SUMMARY  Visits from Start of Care: 1  Current functional level related to goals / functional outcomes: 09/06/23: She was busy, and only really attended therapy once in a month.  Despite that, she followed therapist recommendations and did very well to improve her functional ability and pain symptoms in a positive direction.  She states being able to manage these exercises and recommendations on her own now, and agrees to discharge therapy today, happily.  She has a lot of other medical issues going on and would prefer to spend her attention in time on those things, at this point.  Education / Equipment: Pt has all needed materials and education. Pt understands how to continue on with self-management. See tx notes for more details.   Patient agrees to discharge due to max benefits received from outpatient occupational therapy / hand therapy at this time.     PLAN:  OT FREQUENCY: 1 last visit  OT DURATION: 1 additional week from 09/01/2023 through 09/06/23 1 last visit to cover today's last session and progress note   Leartis Proud, OTR/L, CHT 09/06/2023            END OF SESSION:  OT End of Session - 09/06/23 1551     Visit Number 2    Number of Visits 8    Date for OT Re-Evaluation 09/01/23    Authorization Type [facility]    OT Start Time 1551    OT Stop Time 1614    OT Time Calculation (min) 23 min    Activity Tolerance No increased pain;Patient tolerated treatment well    Behavior During Therapy WFL for tasks assessed/performed           Past Medical History:  Diagnosis Date   Anxiety    Asthma    Depression    Diabetes (HCC)    GERD (gastroesophageal reflux  disease)    History of chicken pox    History of migraine headaches    Migraines    Pituitary adenoma (HCC)    POTS (postural orthostatic tachycardia syndrome)    Seizures (HCC)    Past Surgical History:  Procedure Laterality Date   APPENDECTOMY     CHOLECYSTECTOMY     ESOPHAGEAL MANOMETRY N/A 07/12/2023   Procedure: MANOMETRY, ESOPHAGUS;  Surgeon: Annis Kinder, DO;  Location: [facility];  Service: Gastroenterology;  Laterality: N/A;   PH IMPEDANCE STUDY N/A 07/12/2023   Procedure: IMPEDANCE PH STUDY, ESOPHAGUS;  Surgeon: Annis Kinder, DO;  Location: [facility];  Service: Gastroenterology;  Laterality: N/A;   Pituiary Tumor     Tumor Removal Pituitary   Patient Active Problem List   Diagnosis Date Noted   Bilateral hand swelling 07/25/2023   Palpitations 03/13/2023   Paroxysmal tachycardia (HCC) 03/13/2023   Mixed hyperlipidemia 02/14/2023   Iron  deficiency anemia 01/13/2023   Type 2 diabetes mellitus with hyperglycemia, without long-term current use of insulin  (HCC) 12/28/2022   Asthma 03/23/2021   Hypertriglyceridemia 11/10/2020   Myofascial pain 05/07/2020   Multiple closed fractures of ribs of right side 05/07/2020   Numbness of toes 05/07/2020   Prediabetes 04/28/2020   Pituitary microadenoma (HCC) 04/28/2020   Prolactinoma (HCC) 04/28/2020   Pre-diabetes  12/25/2019   Infertility counseling 12/24/2019   Well woman exam 12/24/2019   Hyperglycemia 06/05/2019   Insomnia 01/07/2019   Bilateral hand pain 11/09/2018   Agoraphobia 01/18/2018   Anxiety with depression 03/24/2017   Migraine 05/11/2016   Pituitary adenoma (HCC) 03/05/2016   Hyperprolactinemia (HCC) 03/02/2016    ONSET DATE: chronic (~5 years or more)   REFERRING DIAG:  M79.641 (ICD-10-CM) - Pain in right hand  M79.642 (ICD-10-CM) - Pain in left hand  M79.89 (ICD-10-CM) - Bilateral hand swelling    THERAPY DIAG:  Pain in right hand  Pain in left hand  Rationale for Evaluation and  Treatment: Rehabilitation  PERTINENT HISTORY: Evaluate and Treat for swelling of the hands with Nate; per provider visit: 5-year history of bilateral hand swelling in the mornings. She denies any injuries. She has had x-rays in the past did not demonstrate any fractures or abnormalities. She actually has lost quite a bit of weight recently however she still gets pain and swelling in her hands most prevalent in the morning. She has multiple allergies and has been recently diagnosed with POTS syndrome exam today is is benign. Certainly she has had lower extremity swelling from her POTS could also be doing the same in her hands.  She states several recent health issues like POTS, MCAS and now EDS.   She also had Lt CuTS release 8 weeks ago and now has less numbness.  She is here mainly for bil hand swelling and pain that is likely linked to EDS. This has been a problem for her for several years   PRECAUTIONS: Other: POTS syndrome and general precautions for this  RED FLAGS: None   WEIGHT BEARING RESTRICTIONS: No   SUBJECTIVE:   SUBJECTIVE STATEMENT: She has not been in therapy for over a month since initial eval.  Today she returns and states she has been busy caring for her husband and attending various other medical appointments.  She was doing her home exercises and tolerating them well.  Currently feeling ok but has recently had flare of pain, swelling arthritis, etc. this week.   She's been having a lot of doc appts and new meds and schedule for giving herself injections, etc.      PAIN:  Are you having pain? Yes: NPRS scale: 3/10 at rest, at worst in the past week up to 7/10 Pain location: Bilateral hands IP joints Pain description: Aching and sore Aggravating factors: Excessive use Relieving factors: N/A  FALLS: Has patient fallen in last 6 months? Yes. Number of falls 6 or more due to pots syndrome, this is now managed with medications /medical recommendations and should  hopefully not affect us  in therapy   PATIENT GOALS: Improve pain symptoms and functional use with bilateral hands  NEXT MD VISIT: As needed   OBJECTIVE: (All objective assessments below are from initial evaluation on: 08/03/23 unless otherwise specified.)   HAND DOMINANCE: Right   ADLs: Overall ADLs: States decreased ability to grab, hold household objects, pain and difficulty to open containers, perform FMS tasks (manipulate fasteners on clothing)   FUNCTIONAL OUTCOME MEASURES: 09/06/23: PSFS: 5.8  Eval: Patient Specific Functional Scale: 4 (grip home objects, fine motor skills, hold cups and tumblers)  (Higher Score  =  Better Ability for the Selected Tasks)       UPPER EXTREMITY ROM     Shoulder to Wrist AROM Right eval Left eval  Wrist flexion 85 78  Wrist extension 80 82  Wrist ulnar deviation  Wrist radial deviation    Functional dart thrower's motion (F-DTM) in ulnar flexion    F-DTM in radial extension     (Blank rows = not tested)   Hand AROM Right eval Left eval  Full Fist Ability (or Gap to Distal Palmar Crease) Full fist Full fist  Thumb Opposition  (Kapandji Scale)  10/10 10/10  (Blank rows = not tested)   HAND FUNCTION: 09/06/23: Grip strength Rt: 52#, Lt: 33#    Eval: Observed weakness in affected bilateral hands, details TBD in the next session as needed Grip strength Right: TBD lbs, Left: TBD lbs   COORDINATION: Eval: No significant coordination deficits noted today, just pain symptoms  SENSATION: Eval: Light touch intact though still slightly diminished in ulnar nerve distribution of the left arm after recent cubital tunnel release  EDEMA:   Eval:  Mildly swollen in bilateral hands  COGNITION: Eval: Overall cognitive status: WFL for evaluation today   OBSERVATIONS:   Eval:   Lt hand IF most sore today, TTP at PIP J mainly, nont at DIP J and slightly at MCP J.   Full AROM, opposition, but tender/painful    TODAY'S TREATMENT:   09/06/23: Today we review recommendations and the home exercise program that was given out about a month ago.  She does very well with them and states understanding how to perform them and can do these without pain.  She also performs gripping and active range of motion for new measures showing some significant improvements and pain-free within functional range gripping in the right hand.  She is slightly below normal limits in the left hand grip strength, perhaps due to complications with recent ulnar nerve surgery, but she intends to continue home exercises and management of ulnar nerve surgery to improve this.  We did discuss functional activities and trying to reduce repetition and stress, using her new compression gloves during activities to help reduce stress as well.  She states understanding all of these things and feeling happy that she is doing better and that she is ready to discharge therapy at this point.  She has some other health issues going on that she needs to manage and focus on as well as caring for her husband.    Exercises reviewed today: - Wrist Prayer Stretch  - 2-3 x daily - 3 reps - 15 sec hold - BACK KNUCKLE STRETCHES   - 4 x daily - 3-5 reps - 15 sec hold - HOOK Stretch  - 4 x daily - 3-5 reps - 15-20 sec hold - Seated Finger Composite Flexion Stretch  - 4 x daily - 3-5 reps - 15 hold - Towel Roll Grip with Forearm in Neutral  - 3 x daily - 5 reps - 10 sec hold  PATIENT EDUCATION: Education details: See tx section above for details  Person educated: Patient Education method: Verbal Instruction, Teach back, Handouts  Education comprehension: States and demonstrates understanding   HOME EXERCISE PROGRAM: Access Code: [email or web address] URL: [email or web address] Date: 08/03/2023 Prepared by: Leartis Proud   GOALS: Goals reviewed with patient? Yes   SHORT TERM GOALS: (STG required if POC>30 days) Target Date: 08/11/2023  Pt will demo/state understanding  of initial HEP to improve pain levels and prerequisite motion. Goal status: 09/06/23: MET   LONG TERM GOALS: Target Date: 09/01/2023  Pt will improve functional ability by decreased impairment per PSFS assessment from 4 to 6 or better, for better quality of life. Goal status: 09/06/23: now 5.8 much improved we will  discharge goal now  2.   Pt will decrease pain at rest from 4/10 to 1/10 or better to have better sleep and occupational participation in daily roles. Goal status: 09/06/23: Improved to 3/10, will discharge goal now  3.  PT will increase grip strength in bilateral hands from TBD to TBD for improved eyes/ADL use.  Goal status: 09/06/23: Now grip strength is within normal limits for the right hand, slightly weak for the left hand, but she will continue to work on this with HEP and recommendations   ASSESSMENT:  CLINICAL IMPRESSION: 09/06/23: She was busy, and only really attended therapy once in a month.  Despite that, she followed therapist recommendations and did very well to improve her functional ability and pain symptoms in a positive direction.  She states being able to manage these exercises and recommendations on her own now, and agrees to discharge therapy today, happily.  She has a lot of other medical issues going on and would prefer to spend her attention in time on those things, at this point.    PLAN:  OT FREQUENCY: 1 last visit  OT DURATION: 1 additional week from 09/01/2023 through 09/06/23 1 last visit to cover today's last session and progress note  PLANNED INTERVENTIONS: 97535 self care/ADL training, 40981 therapeutic exercise, 97530 therapeutic activity, 97112 neuromuscular re-education, 97140 manual therapy, 97760 Orthotic Initial, 97763 Orthotic/Prosthetic subsequent, compression bandaging, energy conservation, coping strategies training, patient/family education, and DME and/or AE instructions  RECOMMENDED OTHER SERVICES: None now  CONSULTED AND AGREED WITH  PLAN OF CARE: Patient  PLAN FOR NEXT SESSION:   N/A/discharge  Leartis Proud, OTR/L, CHT 09/06/2023, 4:44 PM

## 2023-09-05 ENCOUNTER — Encounter: Payer: Self-pay | Admitting: Family Medicine

## 2023-09-05 ENCOUNTER — Encounter: Payer: Self-pay | Admitting: Neurology

## 2023-09-05 LAB — TROPONIN T, HIGH SENSITIVITY: Troponin T High Sensitivity: <15 ng/L (ref <19)

## 2023-09-05 MED ORDER — METOCLOPRAMIDE HCL 5 MG/ML IJ SOLN
10.0000 mg | Freq: Once | INTRAMUSCULAR | Status: AC
  Administered 2023-09-05: 10 mg via INTRAVENOUS
  Filled 2023-09-05: qty 2

## 2023-09-05 MED ORDER — SODIUM CHLORIDE 0.9 % IV BOLUS
1000.0000 mL | Freq: Once | INTRAVENOUS | Status: AC
  Administered 2023-09-05: 1000 mL via INTRAVENOUS

## 2023-09-05 MED ORDER — HYDROMORPHONE HCL 1 MG/ML IJ SOLN
0.5000 mg | Freq: Once | INTRAMUSCULAR | Status: AC
  Administered 2023-09-05: 0.5 mg via INTRAVENOUS
  Filled 2023-09-05: qty 1

## 2023-09-05 MED ORDER — DEXAMETHASONE SODIUM PHOSPHATE 10 MG/ML IJ SOLN
10.0000 mg | Freq: Once | INTRAMUSCULAR | Status: AC
  Administered 2023-09-05: 10 mg via INTRAVENOUS
  Filled 2023-09-05: qty 1

## 2023-09-05 MED ORDER — DIPHENHYDRAMINE HCL 50 MG/ML IJ SOLN
25.0000 mg | Freq: Once | INTRAMUSCULAR | Status: AC
  Administered 2023-09-05: 25 mg via INTRAVENOUS
  Filled 2023-09-05: qty 1

## 2023-09-05 NOTE — ED Provider Notes (Signed)
 [facility] Provider Note   CSN: [id number] Arrival date & time: 09/04/23  2126     Patient presents with: Migraine and Chest Pain   Amber [name] is a 43 y.o. female.   Patient is a 43 year old female with past medical history of migraine headaches, possible POTS, diabetes, anxiety.  Patient presenting today with complaints of migraine headache and elevated heart rate.  She states that since waking this morning she has been experiencing a resting heart rate of 100 which is abnormal for her.  Throughout the day she then began developing a headache consistent with her prior migraines.  No vomiting, or diarrhea, but has felt nauseated throughout the day.  No visual disturbances.       Prior to Admission medications   Medication Sig Start Date End Date Taking? Authorizing Provider  acetaminophen  (TYLENOL ) 500 MG tablet Take 500 mg by mouth as needed.    [provider]  cabergoline  (DOSTINEX ) 0.5 MG tablet 2.5 mg weekly (Two tabs on Mondays, 1 tab on wednesdays and 2 tabs on Fridays) 01/09/23   Shamleffer, Ibtehal Jaralla, MD  cetirizine (ZYRTEC) 10 MG chewable tablet Chew 10 mg by mouth in the morning and at bedtime.    [provider]  Continuous Glucose Sensor (DEXCOM G7 SENSOR) MISC USE 1 SENSOR TOPICALLY EVERY 10 DAYS 07/27/23   Wendling, Shellie Dials, DO  cyanocobalamin  (VITAMIN B12) 1000 MCG/ML injection Inject 1 mL (1,000 mcg total) into the muscle every 14 (fourteen) days. 08/31/23   Sharla Davis, PA-C  dexlansoprazole  (DEXILANT ) 60 MG capsule Take 1 capsule (60 mg total) by mouth daily. 07/24/23   Cirigliano, Vito V, DO  EPINEPHrine  (EPIPEN  2-PAK) 0.3 mg/0.3 mL IJ SOAJ injection Inject 0.3 mg into the muscle as needed for anaphylaxis. 06/14/23   Jobe Mulder, DO  Erenumab -aooe (AIMOVIG ) 140 MG/ML SOAJ Inject 140 mg as directed subcutaneously once a month 07/04/23   Festus Hubert, Adam R, DO  escitalopram  (LEXAPRO )  10 MG tablet Take 1 tablet (10 mg total) by mouth daily. 07/27/23   Jobe Mulder, DO  famotidine  (PEPCID ) 20 MG tablet Take 20 mg by mouth 2 (two) times daily.    [provider]  fenofibrate  (TRICOR ) 145 MG tablet Take 1 tablet (145 mg total) by mouth daily. 05/22/23   Jobe Mulder, DO  folic acid  (FOLVITE ) 1 MG tablet Take 1 tablet (1 mg total) by mouth daily. 05/23/23   Sharla Davis, PA-C  gabapentin (NEURONTIN) 300 MG capsule Take 1 capsule by mouth at bedtime. 01/25/23   [provider]  glucose blood (ACCU-CHEK GUIDE) test strip 1 each by Other route daily in the afternoon. Use as instructed 01/06/23   Shamleffer, Ibtehal Jaralla, MD  ipratropium (ATROVENT ) 0.06 % nasal spray Place 2 sprays into both nostrils 4 (four) times daily. 05/09/23   Orelia Binet, MD  Menaquinone-7 (VITAMIN K2) 100 MCG CAPS Take 1 Dose by mouth daily.    [provider]  omalizumab  (XOLAIR ) 150 MG/ML prefilled syringe Inject 300 mg into the skin every 28 (twenty-eight) days. 06/22/23   Orelia Binet, MD  ondansetron  (ZOFRAN ) 8 MG tablet Take 1 tablet (8 mg total) by mouth as needed for nausea or vomiting. 01/27/23   Merriam Abbey, DO  QUEtiapine  (SEROQUEL ) 300 MG tablet Take 1 tablet (300 mg total) by mouth at bedtime. 05/22/23   Jobe Mulder, DO  Riboflavin 400 MG CAPS Take 400 mg by mouth daily.  [provider]  Rimegepant Sulfate (NURTEC) 75 MG TBDP Take 1 tablet (75 mg total) by mouth as needed (Daily as needed for a Migraine). Maximum 1 tablet in 24 hours.  Quantity 8. 06/12/23   Merriam Abbey, DO  rosuvastatin  (CRESTOR ) 20 MG tablet Take 1 tablet (20 mg total) by mouth daily. 07/27/23   Jobe Mulder, DO  Syringe/Needle, Disp, 25G X 1 1 ML MISC 1 Dose by Does not apply route every 14 (fourteen) days. 08/31/23   Sharla Davis, PA-C  tirzepatide  (MOUNJARO ) 15 MG/0.5ML Pen Inject 15 mg into the skin once a week. 07/27/23   Jobe Mulder, DO  topiramate  (TOPAMAX ) 100 MG tablet Take 1 tablet (100 mg total) by mouth at bedtime. 06/12/23   Festus Hubert, Adam R, DO  traMADol  (ULTRAM ) 50 MG tablet TAKE ONE TABLET BY MOUTH EVERY 6 HOURS AS NEEDED 07/28/23   Merriam Abbey, DO    Allergies: Bee venom, Chlorhexidine, Ibuprofen, Iodine, Peanut-containing drug products, Sesame seed (diagnostic), Shellfish allergy , Soybean-containing drug products, Sulfa antibiotics, Triptans, Lunesta  [eszopiclone ], Almond (diagnostic), Aspirin, Cashew nut (anacardium occidentale) skin test, Hazelnut (filbert), Nsaids, Tape, Walnut, and Wheat    Review of Systems  All other systems reviewed and are negative.   Updated Vital Signs BP 94/78 (BP Location: Left Arm)   Pulse (!) 114   Temp 98.2 F (36.8 C)   Resp 15   Ht 5' 6 (1.676 m)   Wt 91.8 kg   LMP 08/10/2023 (Exact Date)   SpO2 100%   BMI 32.67 kg/m   Physical Exam Vitals and nursing note reviewed.  Constitutional:      General: She is not in acute distress.    Appearance: She is well-developed. She is not diaphoretic.  HENT:     Head: Normocephalic and atraumatic.   Eyes:     Extraocular Movements: Extraocular movements intact.     Pupils: Pupils are equal, round, and reactive to light.    Cardiovascular:     Rate and Rhythm: Normal rate and regular rhythm.     Heart sounds: No murmur heard.    No friction rub. No gallop.  Pulmonary:     Effort: Pulmonary effort is normal. No respiratory distress.     Breath sounds: Normal breath sounds. No wheezing.  Abdominal:     General: Bowel sounds are normal. There is no distension.     Palpations: Abdomen is soft.     Tenderness: There is no abdominal tenderness.   Musculoskeletal:        General: Normal range of motion.     Cervical back: Normal range of motion and neck supple.   Skin:    General: Skin is warm and dry.   Neurological:     General: No focal deficit present.     Mental Status: She is alert and oriented to  person, place, and time.     Cranial Nerves: No cranial nerve deficit.     Motor: No weakness.     (all labs ordered are listed, but only abnormal results are displayed) Labs Reviewed  BASIC METABOLIC PANEL WITH GFR - Abnormal; Notable for the following components:      Result Value   CO2 19 (*)    All other components within normal limits  CBC - Abnormal; Notable for the following components:   Platelets 401 (*)    All other components within normal limits  PREGNANCY, URINE  TROPONIN T, HIGH SENSITIVITY  TROPONIN T,  HIGH SENSITIVITY    EKG: None  Radiology: DG Chest 2 View Result Date: 09/04/2023 CLINICAL DATA:  Chest pain EXAM: CHEST - 2 VIEW COMPARISON:  05/11/2023 FINDINGS: The heart size and mediastinal contours are within normal limits. Both lungs are clear. The visualized skeletal structures are unremarkable. IMPRESSION: No active cardiopulmonary disease. Electronically Signed   By: Esmeralda Hedge M.D.   On: 09/04/2023 22:00     Procedures   Medications Ordered in the ED  sodium chloride  0.9 % bolus 1,000 mL (has no administration in time range)  dexamethasone  (DECADRON ) injection 10 mg (has no administration in time range)  metoCLOPramide  (REGLAN ) injection 10 mg (has no administration in time range)  diphenhydrAMINE  (BENADRYL ) injection 25 mg (has no administration in time range)  HYDROmorphone  (DILAUDID ) injection 0.5 mg (has no administration in time range)                                    Medical Decision Making Amount and/or Complexity of Data Reviewed Labs: ordered. Radiology: ordered.  Risk Prescription drug management.   Patient is a 43 year old female presenting with headache and rapid heartbeat as described in the HPI.  Patient arrives here with stable vital signs and is afebrile.  Physical examination basically unremarkable.  Laboratory studies obtained including CBC, metabolic panel, and troponin x 2, all of which are basically  unremarkable.  Chest x-ray showing no acute process.  Patient hydrated with normal saline and given a migraine cocktail consisting of Decadron , Benadryl , Reglan , and Dilaudid .  After a repeat dose of Dilaudid , she is feeling much better and I believe can safely be discharged.     Final diagnoses:  None    ED Discharge Orders     None          Orvilla Blander, MD 09/05/23 [phone]

## 2023-09-05 NOTE — Discharge Instructions (Signed)
 Continue home medications as previously prescribed.  Return to the ER if symptoms worsen or change.

## 2023-09-06 ENCOUNTER — Ambulatory Visit (INDEPENDENT_AMBULATORY_CARE_PROVIDER_SITE_OTHER): Admitting: Rehabilitative and Restorative Service Providers"

## 2023-09-06 ENCOUNTER — Other Ambulatory Visit: Payer: Self-pay | Admitting: Neurology

## 2023-09-06 ENCOUNTER — Encounter: Payer: Self-pay | Admitting: Rehabilitative and Restorative Service Providers"

## 2023-09-06 ENCOUNTER — Other Ambulatory Visit: Payer: Self-pay

## 2023-09-06 DIAGNOSIS — M79642 Pain in left hand: Secondary | ICD-10-CM | POA: Diagnosis not present

## 2023-09-06 DIAGNOSIS — M79641 Pain in right hand: Secondary | ICD-10-CM

## 2023-09-06 DIAGNOSIS — E1165 Type 2 diabetes mellitus with hyperglycemia: Secondary | ICD-10-CM

## 2023-09-06 MED ORDER — ONDANSETRON 8 MG PO TBDP: 8.0000 mg | ORAL_TABLET | Freq: Three times a day (TID) | ORAL | 1 refills | Status: AC | PRN

## 2023-09-06 MED ORDER — TIRZEPATIDE 15 MG/0.5ML ~~LOC~~ SOAJ: 15.0000 mg | SUBCUTANEOUS | 2 refills | Status: DC

## 2023-09-06 MED ORDER — TOPIRAMATE 100 MG PO TABS: 100.0000 mg | ORAL_TABLET | Freq: Every day | ORAL | 5 refills | Status: DC

## 2023-09-06 MED ORDER — NURTEC 75 MG PO TBDP: 75.0000 mg | ORAL_TABLET | ORAL | 5 refills | Status: DC | PRN

## 2023-09-06 MED ORDER — ACCU-CHEK GUIDE TEST VI STRP: ORAL_STRIP | 2 refills | Status: AC

## 2023-09-11 ENCOUNTER — Other Ambulatory Visit: Payer: Self-pay | Admitting: Neurology

## 2023-09-13 ENCOUNTER — Encounter: Admitting: Rehabilitative and Restorative Service Providers"

## 2023-09-13 ENCOUNTER — Encounter: Payer: Self-pay | Admitting: Neurology

## 2023-09-15 ENCOUNTER — Encounter: Payer: Self-pay | Admitting: Neurology

## 2023-09-15 ENCOUNTER — Other Ambulatory Visit: Payer: Self-pay | Admitting: Neurology

## 2023-09-15 MED ORDER — TRAMADOL HCL 50 MG PO TABS: 50.0000 mg | ORAL_TABLET | Freq: Four times a day (QID) | ORAL | 2 refills | Status: DC | PRN

## 2023-09-19 ENCOUNTER — Ambulatory Visit: Admitting: Family Medicine

## 2023-09-19 VITALS — BP 112/80 | HR 102 | Ht 66.0 in | Wt 197.0 lb

## 2023-09-19 DIAGNOSIS — D8940 Mast cell activation, unspecified: Secondary | ICD-10-CM

## 2023-09-19 DIAGNOSIS — G901 Familial dysautonomia [Riley-Day]: Secondary | ICD-10-CM

## 2023-09-19 DIAGNOSIS — Q796 Ehlers-Danlos syndrome, unspecified: Secondary | ICD-10-CM

## 2023-09-19 DIAGNOSIS — M2550 Pain in unspecified joint: Secondary | ICD-10-CM

## 2023-09-19 NOTE — Patient Instructions (Addendum)
 Thank you for coming in today.   Check out the book, Disjointed  I've referred you to Physical Therapy.  Let us  know if you don't hear from them in one week.   Check back in 3 months

## 2023-09-19 NOTE — Progress Notes (Unsigned)
   Amber [name] acting as a scribe for Artist Lloyd, MD.  Amber [name] is a 43 y.o. female who presents to [facility] at [facility] today for evaluation of her hypermobility.  MS: bilat hip pain w/ sublux in middle school, bilat hand pain and swelling, bilat shoulder instability R>L Skin/Immune reactions: stretchy skin, chronic urticaria Nervous system: dizziness esp w/ transitioning positions Head/Spine: chronic migraines, insomnia CV: tachycardia when transitioning to stand GI: GERD, chronic diarrhea, intermittent constipation , Genitourinary: none Hands & Feet: swell, paresthesia bilat hands and feet  Pertinent review of systems: No fevers or chills  Relevant historical information: Diabetes, pituitary adenoma   Exam:  BP 112/80   Pulse (!) 102   Ht 5' 6 (1.676 m)   Wt 197 lb (89.4 kg)   LMP 08/10/2023 (Exact Date)   SpO2 98%   BMI 31.80 kg/m  General: Well Developed, well nourished, and in no acute distress.   MSK: Hypermobility evaluation Beighton hypermobility score 8/9 EDS evaluation significant for soft velvety skin, mild skin hyperextensibility, bilateral piezogenic papules, dental crowding, atrophic scarring    Lab and Radiology Results No results found for this or any previous visit (from the past 72 hours). No results found.     Assessment and Plan: 43 y.o. female with chronic musculoskeletal pain occurring in the setting of Ehlers-Danlos syndrome. Plan refer to physical therapy for the musculoskeletal pain.  Additionally she has very bothersome dysautonomia/POTS.  This is currently being investigated by cardiology.  She has an appointment next week on July 11 with Dr. Inocencio.  Happy to assist although I think she is in good hands.  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  She is optimally managed by allergy  with H1 and H2 blockers, Xolair , and is about to start  cromolyn.  I think she is doing pretty well with management.  Check back in 3 months or return sooner if needed.  PDMP not reviewed this encounter. Orders Placed This Encounter  Procedures   Ambulatory referral to Physical Therapy    Referral Priority:   Routine    Referral Type:   Physical Medicine    Referral Reason:   Specialty Services Required    Requested Specialty:   Physical Therapy    Number of Visits Requested:   1   No orders of the defined types were placed in this encounter.    Discussed warning signs or symptoms. Please see discharge instructions. Patient expresses understanding.   The above documentation has been reviewed and is accurate and complete Artist Lloyd, M.D.

## 2023-09-20 ENCOUNTER — Encounter: Admitting: Rehabilitative and Restorative Service Providers"

## 2023-09-20 ENCOUNTER — Encounter: Payer: Self-pay | Admitting: Family Medicine

## 2023-09-20 DIAGNOSIS — Q796 Ehlers-Danlos syndrome, unspecified: Secondary | ICD-10-CM | POA: Insufficient documentation

## 2023-09-20 DIAGNOSIS — G901 Familial dysautonomia [Riley-Day]: Secondary | ICD-10-CM | POA: Insufficient documentation

## 2023-09-20 NOTE — Telephone Encounter (Signed)
Forwarding to Dr. Corey to review.  

## 2023-09-27 ENCOUNTER — Ambulatory Visit (INDEPENDENT_AMBULATORY_CARE_PROVIDER_SITE_OTHER): Admitting: Internal Medicine

## 2023-09-27 VITALS — BP 102/60 | HR 96 | Temp 97.7°F | Resp 17 | Wt 196.2 lb

## 2023-09-27 DIAGNOSIS — Q796 Ehlers-Danlos syndrome, unspecified: Secondary | ICD-10-CM

## 2023-09-27 DIAGNOSIS — G90A Postural orthostatic tachycardia syndrome (POTS): Secondary | ICD-10-CM | POA: Diagnosis not present

## 2023-09-27 DIAGNOSIS — J3089 Other allergic rhinitis: Secondary | ICD-10-CM

## 2023-09-27 DIAGNOSIS — K529 Noninfective gastroenteritis and colitis, unspecified: Secondary | ICD-10-CM | POA: Diagnosis not present

## 2023-09-27 DIAGNOSIS — D8940 Mast cell activation, unspecified: Secondary | ICD-10-CM

## 2023-09-27 MED ORDER — CROMOLYN SODIUM 100 MG/5ML PO CONC: 100.0000 mg | Freq: Three times a day (TID) | ORAL | 12 refills | Status: DC

## 2023-09-27 NOTE — Progress Notes (Signed)
 FOLLOW UP Date of Service/Encounter:  09/29/23  Subjective:  Amber [name] (DOB: [birth year]) is a 43 y.o. female who [facility] in re-evaluation of the following: MACS/urticaria/POTS/EDS/Rhinnorrhea  History obtained from: chart review and patient.  For Review, LV was on 06/21/23  with Dr. Lorin seen for routine follow-up. See below for summary of history and diagnostics.   Therapeutic plans/changes recommended: Started Xolair  300 mg every 4 weeks  Today presents for follow-up. Discussed the use of AI scribe software for clinical note transcription with the patient, who gave verbal consent to proceed.  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 She has been referred to Dr. Artist Lloyd at [facility] for further management.  Mast cell activation symptoms - Current regimen includes Xolair , Pepcid , and an H1 antihistamine blocker, which has been effective in controlling symptoms. - Experienced a mild breakout of hives after exposure to freshly mowed grass, a known trigger. - Hives were less severe than previous episodes and resolved quickly with current medications.  Gastrointestinal symptoms - Gastrointestinal symptoms are present but have improved with current management - She is interested in starting gastrocom for diarrhea   Connective tissue symptoms - Recently diagnosed with Ehlers-Danlos Syndrome (EDS). - Exploring treatment options, including physical therapy.  Medication administration - Administers Xolair  injections at home. - Receives B12 injections every two weeks.  Rhinitis  - controlled with nasal atrovent     All medications reviewed by clinical staff and updated in chart. No new pertinent medical or surgical history except as noted in  HPI.  ROS: All others negative except as noted per HPI.   Objective:  BP 102/60   Pulse 96   Temp 97.7 F (36.5 C) (Temporal)   Resp 17   Wt 196 lb 3.2 oz (89 kg)   LMP 08/10/2023 (Exact Date)   SpO2 98%   BMI 31.67 kg/m  Body mass index is 31.67 kg/m. Physical Exam: General Appearance:  Alert, cooperative, no distress, appears stated age  Head:  Normocephalic, without obvious abnormality, atraumatic  Eyes:  Conjunctiva clear, EOM's intact  Ears EACs normal bilaterally  Nose: Nares normal, erythematous nasal mucosa , no visible anterior polyps, and septum midline  Throat: Lips, tongue normal; teeth and gums normal, + cobblestoning  Neck: Supple, symmetrical  Lungs:   clear to auscultation bilaterally, Respirations unlabored, no coughing  Heart:  regular rate and rhythm and no murmur, Appears well perfused  Extremities: No edema  Skin: Skin color, texture, turgor normal and no rashes or lesions on visualized portions of skin  Neurologic: No gross deficits   Labs:  Lab Orders  No laboratory test(s) ordered today     Assessment/Plan   Patient Instructions   Anaphylaxis/MCAS/POTS/ Urticaria  Allergy  test: negative to all foods, positive to grass, weed, mold  -tryptase, alpha gal labs were negative  -CU index > 50  -Continue to avoid sesame, soy, wheat, nuts for now   -Written allergy  action plan  -Continue to carry EpiPen  autoinjector -Contineu cetirizine 10 mg twice a day, Pepcid  20 mg twice a day - Continue xolair  300mg  every 4 weeks  - Start gastrocrom  100mg  three times a day for persistent GI symptoms   Check out: [email or web address]   Rhinnorhea  Continue  -Atrovent  (ipatopium) nasal spray 1-2 sprays in each nostril up to three  times daily AS NEEDED for POST NASAL DRIP/RUNNY NOSE/DRAINAGE.  If you become too dry, use less often.   Follow up: 6 months   Thank you so much for letting me partake in your care today.  Don't  hesitate to reach out if you have any additional concerns!  Hargis Springer, MD  [facility] [place], [place]  Other:    Thank you so much for letting me partake in your care today.  Don't hesitate to reach out if you have any additional concerns!  Hargis Springer, MD  [facility] [place], [place]

## 2023-09-27 NOTE — Patient Instructions (Addendum)
 Anaphylaxis/MCAS/POTS/ Urticaria  Allergy  test: negative to all foods, positive to grass, weed, mold  -tryptase, alpha gal labs were negative  -CU index > 50  -Continue to avoid sesame, soy, wheat, nuts for now   -Written allergy  action plan  -Continue to carry EpiPen  autoinjector -Contineu cetirizine 10 mg twice a day, Pepcid  20 mg twice a day - Continue xolair  300mg  every 4 weeks  - Start gastrocrom  100mg  three times a day for persistent GI symptoms   Check out: [email or web address]   Rhinnorhea  Continue  -Atrovent  (ipatopium) nasal spray 1-2 sprays in each nostril up to three times daily AS NEEDED for POST NASAL DRIP/RUNNY NOSE/DRAINAGE.  If you become too dry, use less often.   Follow up: 6 months   Thank you so much for letting me partake in your care today.  Don't hesitate to reach out if you have any additional concerns!  Hargis Springer, MD  [facility] [place], [place]

## 2023-09-28 ENCOUNTER — Inpatient Hospital Stay

## 2023-09-28 ENCOUNTER — Inpatient Hospital Stay: Attending: Medical Oncology

## 2023-09-28 ENCOUNTER — Inpatient Hospital Stay (HOSPITAL_BASED_OUTPATIENT_CLINIC_OR_DEPARTMENT_OTHER): Admitting: Medical Oncology

## 2023-09-28 ENCOUNTER — Encounter: Payer: Self-pay | Admitting: Medical Oncology

## 2023-09-28 VITALS — BP 91/67 | HR 90 | Temp 98.2°F | Resp 19 | Ht 66.0 in | Wt 195.0 lb

## 2023-09-28 DIAGNOSIS — D509 Iron deficiency anemia, unspecified: Secondary | ICD-10-CM | POA: Insufficient documentation

## 2023-09-28 DIAGNOSIS — E559 Vitamin D deficiency, unspecified: Secondary | ICD-10-CM

## 2023-09-28 DIAGNOSIS — E639 Nutritional deficiency, unspecified: Secondary | ICD-10-CM

## 2023-09-28 DIAGNOSIS — E538 Deficiency of other specified B group vitamins: Secondary | ICD-10-CM | POA: Insufficient documentation

## 2023-09-28 DIAGNOSIS — R7989 Other specified abnormal findings of blood chemistry: Secondary | ICD-10-CM

## 2023-09-28 DIAGNOSIS — K909 Intestinal malabsorption, unspecified: Secondary | ICD-10-CM | POA: Diagnosis present

## 2023-09-28 LAB — RETIC PANEL
Immature Retic Fract: 7.8 % (ref 2.3–15.9)
RBC.: 4.34 MIL/uL (ref 3.87–5.11)
Retic Count, Absolute: 58.6 K/uL (ref 19.0–186.0)
Retic Ct Pct: 1.4 % (ref 0.4–3.1)
Reticulocyte Hemoglobin: 33.5 pg (ref >27.9)

## 2023-09-28 LAB — CBC WITH DIFFERENTIAL (CANCER CENTER ONLY)
Abs Immature Granulocytes: 0.08 K/uL — ABNORMAL HIGH (ref 0.00–0.07)
Basophils Absolute: 0.1 K/uL (ref 0.0–0.1)
Basophils Relative: 1 %
Eosinophils Absolute: 0.2 K/uL (ref 0.0–0.5)
Eosinophils Relative: 2 %
HCT: 37.8 % (ref 36.0–46.0)
Hemoglobin: 12.9 g/dL (ref 12.0–15.0)
Immature Granulocytes: 1 %
Lymphocytes Relative: 30 %
Lymphs Abs: 2.6 K/uL (ref 0.7–4.0)
MCH: 29.5 pg (ref 26.0–34.0)
MCHC: 34.1 g/dL (ref 30.0–36.0)
MCV: 86.3 fL (ref 80.0–100.0)
Monocytes Absolute: 0.5 K/uL (ref 0.1–1.0)
Monocytes Relative: 6 %
Neutro Abs: 5.4 K/uL (ref 1.7–7.7)
Neutrophils Relative %: 60 %
Platelet Count: 360 K/uL (ref 150–400)
RBC: 4.38 MIL/uL (ref 3.87–5.11)
RDW: 14 % (ref 11.5–15.5)
WBC Count: 8.8 K/uL (ref 4.0–10.5)
nRBC: 0 % (ref 0.0–0.2)

## 2023-09-28 LAB — FERRITIN: Ferritin: 47 ng/mL (ref 11–307)

## 2023-09-28 LAB — IRON AND IRON BINDING CAPACITY (CC-WL,HP ONLY)
Iron: 43 ug/dL (ref 28–170)
Saturation Ratios: 11 % (ref 10.4–31.8)
TIBC: 391 ug/dL (ref 250–450)
UIBC: 348 ug/dL (ref 148–442)

## 2023-09-28 LAB — FOLATE: Folate: 11.2 ng/mL (ref >5.9)

## 2023-09-28 LAB — VITAMIN B12: Vitamin B-12: 309 pg/mL (ref 180–914)

## 2023-09-28 LAB — VITAMIN D 25 HYDROXY (VIT D DEFICIENCY, FRACTURES): Vit D, 25-Hydroxy: 19.96 ng/mL — ABNORMAL LOW (ref 30–100)

## 2023-09-28 MED ORDER — CYANOCOBALAMIN 1000 MCG/ML IJ SOLN
1000.0000 ug | Freq: Once | INTRAMUSCULAR | Status: AC
  Administered 2023-09-28: 1000 ug via INTRAMUSCULAR
  Filled 2023-09-28: qty 1

## 2023-09-28 NOTE — Patient Instructions (Signed)
 Vitamin B12 Injection What is this medication? Vitamin B12 (VAHY tuh min B12) prevents and treats low vitamin B12 levels in your body. It is used in people who do not get enough vitamin B12 from their diet or when their digestive tract does not absorb enough. Vitamin B12 plays an important role in maintaining the health of your nervous system and red blood cells. This medicine may be used for other purposes; ask your health care provider or pharmacist if you have questions. COMMON BRAND NAME(S): B-12 Compliance Kit, B-12 Injection Kit, Cyomin, Dodex, LA-12, Nutri-Twelve, Physicians EZ Use B-12, Primabalt, Vitamin Deficiency Injectable System - B12 What should I tell my care team before I take this medication? They need to know if you have any of these conditions: Kidney disease Leber's disease Megaloblastic anemia An unusual or allergic reaction to cyanocobalamin, cobalt, other medications, foods, dyes, or preservatives Pregnant or trying to get pregnant Breast-feeding How should I use this medication? This medication is injected into a muscle or deeply under the skin. It is usually given in a clinic or care team's office. However, your care team may teach you how to inject yourself. Follow all instructions. Talk to your care team about the use of this medication in children. Special care may be needed. Overdosage: If you think you have taken too much of this medicine contact a poison control center or emergency room at once. NOTE: This medicine is only for you. Do not share this medicine with others. What if I miss a dose? If you are given your dose at a clinic or care team's office, call to reschedule your appointment. If you give your own injections, and you miss a dose, take it as soon as you can. If it is almost time for your next dose, take only that dose. Do not take double or extra doses. What may interact with this medication? Alcohol Colchicine This list may not describe all possible  interactions. Give your health care provider a list of all the medicines, herbs, non-prescription drugs, or dietary supplements you use. Also tell them if you smoke, drink alcohol, or use illegal drugs. Some items may interact with your medicine. What should I watch for while using this medication? Visit your care team regularly. You may need blood work done while you are taking this medication. You may need to follow a special diet. Talk to your care team. Limit your alcohol intake and avoid smoking to get the best benefit. What side effects may I notice from receiving this medication? Side effects that you should report to your care team as soon as possible: Allergic reactions--skin rash, itching, hives, swelling of the face, lips, tongue, or throat Swelling of the ankles, hands, or feet Trouble breathing Side effects that usually do not require medical attention (report to your care team if they continue or are bothersome): Diarrhea This list may not describe all possible side effects. Call your doctor for medical advice about side effects. You may report side effects to FDA at 1-800-FDA-1088. Where should I keep my medication? Keep out of the reach of children. Store at room temperature between 15 and 30 degrees C (59 and 85 degrees F). Protect from light. Throw away any unused medication after the expiration date. NOTE: This sheet is a summary. It may not cover all possible information. If you have questions about this medicine, talk to your doctor, pharmacist, or health care provider.  2024 Elsevier/Gold Standard (2020-11-17 00:00:00)

## 2023-09-28 NOTE — Progress Notes (Signed)
 Hematology and Oncology Follow Up Visit  Amber [name] [id number] Aug 19, 1980 43 y.o. 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 is back for follow-up for IDA:  At her initial consult on 01/13/2023 it was suspected that her IDA was secondary to malabsorption from her IBS/PPI use as well as heavy menses. GI evaluation was recommended given her family history  (colon cancer of father). She was also start on IV venofer  and is S/P one cycle of treatment.   She also has a history of Mast Cell Activation Syndrome. She is seeing a rheumatologist and immunologist. She is now on zyrtec and pepcid . Feeling a bit better overall.    She reports tolerating the IV iron  well without side effects however she is having more and more reactions to foods/items. She has had multiple anaphylactic reactions so IV iron  was put on hold given risks. She reports that the various iron  supplements are hard on her GI system. She would like to go back on the IV iron . She tolerated this well previously. She started XOLAIR  about 4 months ago to help with her MCAS. It will take about 6 months to have full effect. She is going to start Cromolyn  soon to further help her GI related symptoms.    Today she reports that she has been doing well. She has not had any  episodes of anaphylaxis since our last visit. She has had two episodes of syncope since our last visit which is her average per month. She continues to be seen by a POTs specialist for this.   She sees a EDS specialist in [facility]- Dr. Joane. She has PT set up with a local EDS PT specialists. She is very excited about all this. She has no restrictions on blood work.   She is tolerating B12 injections well.  There has been no bleeding to her knowledge aside from her regular menstrual cycles  On Mounjaro  for weight loss which she has been on since October.   Wt Readings from Last 3 Encounters:  09/28/23 195 lb (88.5 kg)  09/27/23 196 lb 3.2 oz (89 kg)  09/19/23 197 lb (89.4 kg)     Medications:   Current Outpatient Medications:    acetaminophen  (TYLENOL ) 500 MG tablet, Take 500 mg by mouth as needed., Disp: , Rfl:    cabergoline  (DOSTINEX ) 0.5 MG tablet, 2.5 mg weekly (Two tabs on Mondays, 1 tab on wednesdays and 2 tabs on Fridays), Disp: 65 tablet, Rfl: 3   cetirizine (ZYRTEC) 10 MG chewable tablet, Chew 10 mg by mouth in the morning and at bedtime., Disp: , Rfl:    Continuous Glucose Sensor (DEXCOM G7 SENSOR) MISC, USE 1 SENSOR TOPICALLY EVERY 10 DAYS, Disp: 1 each, Rfl: 11   cromolyn  (GASTROCROM ) 100 MG/5ML  solution, Take 5 mLs (100 mg total) by mouth 4 (four) times daily -  before meals and at bedtime., Disp: 480 mL, Rfl: 12   cyanocobalamin  (VITAMIN B12) 1000 MCG/ML injection, Inject 1 mL (1,000 mcg total) into the muscle every 14 (fourteen) days., Disp: 6 mL, Rfl: 4   dexlansoprazole  (DEXILANT ) 60 MG capsule, Take 1 capsule (60 mg total) by mouth daily., Disp: 90 capsule, Rfl: 3   EPINEPHrine  (EPIPEN  2-PAK) 0.3 mg/0.3 mL IJ SOAJ injection, Inject 0.3 mg into the muscle as needed for anaphylaxis., Disp: 1 each, Rfl: 2   Erenumab -aooe (AIMOVIG ) 140 MG/ML SOAJ, Inject 140 mg as directed subcutaneously once a month, Disp: 1 mL, Rfl: 7   escitalopram  (LEXAPRO ) 10 MG tablet, Take 1 tablet  (10 mg total) by mouth daily., Disp: 90 tablet, Rfl: 3   famotidine  (PEPCID ) 20 MG tablet, Take 20 mg by mouth 2 (two) times daily., Disp: , Rfl:    fenofibrate  (TRICOR ) 145 MG tablet, Take 1 tablet (145 mg total) by mouth daily., Disp: 90 tablet, Rfl: 3   folic acid  (FOLVITE ) 1 MG tablet, Take 1 tablet (1 mg total) by mouth daily., Disp: 30 tablet, Rfl: 6   gabapentin (NEURONTIN) 300 MG capsule, Take 1 capsule by mouth at bedtime., Disp: , Rfl:    glucose blood (ACCU-CHEK GUIDE TEST) test strip, Use to Check Blood Su[place]r, Disp: 100 each, Rfl: 2   glucose blood (ACCU-CHEK GUIDE) test strip, 1 each by Other route daily in the afternoon. Use as instructed, Disp: 100 each, Rfl: 3   ipratropium (ATROVENT ) 0.06 % nasal spray, Place 2 sprays into both nostrils 4 (four) times daily., Disp: 15 mL, Rfl: 12   Menaquinone-7 (VITAMIN K2) 100 MCG CAPS, Take 1 Dose by mouth daily., Disp: , Rfl:    omalizumab  (XOLAIR ) 150 MG/ML prefilled syringe, Inject 300 mg into the skin every 28 (twenty-eight) days., Disp: 6 mL, Rfl: 3   ondansetron  (ZOFRAN -ODT) 8 MG disintegrating tablet, Take 1 tablet (8 mg total) by mouth every 8 (eight) hours as needed for nausea or vomiting., Disp: 60 tablet, Rfl: 1   QUEtiapine  (SEROQUEL ) 300 MG tablet, Take 1 tablet (300 mg total) by mouth at bedtime., Disp: 90 tablet, Rfl: 3   Riboflavin 400 MG CAPS, Take 400 mg by mouth daily., Disp: , Rfl:    Rimegepant Sulfate (NURTEC) 75 MG TBDP, Take 1 tablet (75 mg total) by mouth as needed (Daily as needed for a Migraine). Maximum 1 tablet in 24 hours.  Quantity 8., Disp: 8 tablet, Rfl: 5   rosuvastatin  (CRESTOR ) 20 MG tablet, Take 1 tablet (20 mg total) by mouth daily., Disp: 90 tablet, Rfl: 3   Syringe/Needle, Disp, 25G X 1 1 ML MISC, 1 Dose by Does not apply route every 14 (fourteen) days., Disp: 25 each, Rfl: 0   tirzepatide  (MOUNJARO ) 15 MG/0.5ML Pen, Inject 15 mg into the skin once a week., Disp: 2 mL, Rfl: 2   topiramate  (TOPAMAX ) 100 MG  tablet, Take 1 tablet (100 mg total) by mouth at bedtime., Disp: 30 tablet, Rfl: 5   traMADol  (ULTRAM ) 50 MG tablet, Take 1 tablet (50 mg total) by mouth every 6 (six) hours as needed., Disp: 30 tablet, Rfl: 2 No current facility-administered medications for this visit.  Facility-Administered Medications Ordered in Other Visits:    cyanocobalamin  (VITAMIN B12) injection 1,000 mcg, 1,000 mcg, Intramuscular, Once, Ennever, Maude SAUNDERS, MD  Allergies:  Allergies  Allergen Reactions   Bee Venom Anaphylaxis   Chlorhexidine  Hives, Itching and Rash   Ibuprofen Other (See Comments)    GI bleed   Iodine Hives, Itching and Rash   Peanut-Containing Drug Products Other (See Comments)    Pt reports unknown   Sesame Seed (Diagnostic) Anaphylaxis   Shellfish Allergy  Shortness Of Breath, Swelling and Other (See Comments)    Tingling in the lips, mouth and throat   Soybean-Containing Drug Products Anaphylaxis   Sulfa Antibiotics Anaphylaxis   Triptans Other (See Comments)    Makes pain worse. Pain in joints   Lunesta  [Eszopiclone ] Nausea And Vomiting    GI issues   Almond (Diagnostic) Other (See Comments)    Unknown per pt   Aspirin Other (See Comments)    UNKNOWN REACTION. Her parents and siblings are allergic to Aspirin.   Cashew Nut (Anacardium Occidentale) Skin Test Other (See Comments)    Unknown per pt   Hazelnut (Filbert) Other (See Comments)    Unknown per pt   Nsaids Nausea And Vomiting   Tape Rash    Blisters    Walnut Other (See Comments)    Unknown per pt   Wheat Nausea And Vomiting    Past Medical History, Surgical history, Social history, and Family History were reviewed and updated.  Review of Systems: As stated above in HPI  Physical Exam:  height is 5' 6 (1.676 m) and weight is 195 lb (88.5 kg). Her oral temperature is 98.2 F (36.8 C). Her blood pressure is 91/67 and her pulse is 90. Her respiration is 19 and oxygen saturation is 100%.   Physical Exam General:  NAD Cardiovascular: regular rate and rhythm Pulmonary: clear ant fields Abdomen: soft, nontender, + bowel sounds GU: no suprapubic tenderness Extremities: no edema, no joint deformities Skin: no rashes Neurological: Weakness but otherwise nonfocal   Lab Results  Component Value Date   WBC 8.8 09/28/2023   HGB 12.9 09/28/2023   HCT 37.8 09/28/2023   MCV 86.3 09/28/2023   PLT 360 09/28/2023     Chemistry      Component Value Date/Time   NA 138 09/04/2023 2135   K 3.6 09/04/2023 2135   CL 104 09/04/2023 2135   CO2 19 (L) 09/04/2023 2135   BUN 14 09/04/2023 2135   CREATININE 0.92 09/04/2023 2135   CREATININE 1.05 (H) 06/26/2023 1415   CREATININE 0.99 12/24/2019 1316      Component Value Date/Time   CALCIUM  9.8 09/04/2023 2135   ALKPHOS 30 (L) 06/26/2023 1415   AST 27 06/26/2023 1415   ALT 31 06/26/2023 1415   BILITOT 0.5 06/26/2023 1415     No diagnosis found.   Assessment and Plan- Patient is a 43 y.o. female with multifactorial anemia. She has IDA secondary to malabsorption(GERD/PPI use/ MCAS) and from chronic loss (menstrual cycles). She also has a history of nutritional deficiencies including B12 and folate.  She is taking once daily folic acid  1 G along with monthly B12 injections.   CBC is normal.  Thrombocytosis has improved with correction of her folate.B12 Retic count is unremarkable Nutritional labs pending.  Continue monthly B12 injections.    Disposition: B12 today IV iron  weekly x 3 B12 monthly x 2 RTC 3 month APP, labs (CBC w/, retic, iron , ferritin, B12), B12   Lauraine Dais PA-C 7/10/20251:24 PM

## 2023-09-28 NOTE — Progress Notes (Signed)
 Electrophysiology Office Note:   Date:  09/29/2023  ID:  Amber [name], DOB [birth year], MRN [id number]  Primary Cardiologist: None Primary Heart Failure: None Electrophysiologist: Kassady Laboy Gladis Norton, MD      History of Present Illness:   Amber [name] is a 43 y.o. female with h/o palpitations, type 2 diabetes, pituitary adenoma seen today for  for Electrophysiology evaluation of palpitations, syncope at the request of Sunit Tolia.    She wore a cardiac monitor with triggered events showing sinus versus atrial tachycardia.  She was recommended to avoid stimulants.  Well-hydrated.  She was started on metoprolol .  She is concerned about POTS.  She has tried conservative therapy including hydration, changing position.  She has been reluctant to try beta-blockers due to low blood pressures.  She has had multiple syncopal episodes.  Most recent, she was in her usual state of health sitting on the sofa.  She got up to adjust a fan on her way and started experiencing dizziness/tunnel vision.  She lost consciousness for reportedly 30 to 45 seconds.  She did not have loss of bowel or bladder function.  She has had a few similar episodes of syncope over the last few months.  They have all occurred when she stands up quickly.  She has no chest pain and no shortness of breath during these episodes.  She does have significant palpitations.  She drinks quite a bit of fluid throughout the day.  She has tried compression stockings but has not found a pair that fit her correctly.  She wears compression leggings, and is looking for compression stockings.  She at times wears abdominal compression garments.  Today, denies symptoms of chest pain, dyspnea, orthopnea, PND, lower extremity edema, claudication, dizziness, presyncope, syncope, bleeding, or neurologic sequela. The patient is tolerating medications without difficulties.    Review of systems complete and found to be negative unless listed in HPI.    EP Information / Studies Reviewed:    EKG is not ordered today. EKG from 09/04/2023 reviewed which showed sinus tachycardia        Risk Assessment/Calculations:             Physical Exam:   VS:  BP 124/84 (BP Location: Right Arm, Patient Position: Sitting, Cuff Size: Large)   Pulse 84   Ht 5' 6 (1.676 m)   Wt 195 lb (88.5 kg)   LMP 08/10/2023 (Exact Date)   SpO2 98%   BMI 31.47 kg/m    Wt Readings from Last 3 Encounters:  09/29/23 195 lb (88.5 kg)  09/28/23 195 lb (88.5 kg)  09/27/23 196 lb 3.2 oz (89 kg)    Orthostatic VS for the past 24 hrs (Last 3 readings):  BP- Lying Pulse- Lying BP- Sitting Pulse- Sitting BP- Standing at 0 minutes Pulse- Standing at 0 minutes BP- Standing at 3 minutes Pulse- Standing at 3 minutes  09/29/23 0854 (!) 89/62 79 (!) 87/62 91 (!) 69/52 128 107/62 124     GEN: Well nourished, well developed in no acute distress NECK: No JVD; No carotid bruits CARDIAC: Regular rate and rhythm, no murmurs, rubs, gallops RESPIRATORY:  Clear to auscultation without rales, wheezing or rhonchi  ABDOMEN: Soft, non-tender, non-distended EXTREMITIES:  No edema; No deformity   ASSESSMENT AND PLAN:    1.  Recurrent syncope: Appears due to orthostasis.  She is hydrating well.  She continues to have these episodes.  Likely related to her autonomic dysfunction.  2.  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  She has significant palpitations when she stands up as well as syncope and brain fog.  She understands there is no immediate fix for this.  She has been hydrating well.  We Mayda Shippee give her exercise regimen to follow as well as start diltiazem  120 mg daily.  She Izzie Geers work on finding compression garments as well.  Follow up with Dr. Inocencio in 3 months  Signed, Riddick Nuon Gladis Inocencio, MD

## 2023-09-29 ENCOUNTER — Ambulatory Visit: Attending: Cardiology | Admitting: Cardiology

## 2023-09-29 ENCOUNTER — Encounter: Payer: Self-pay | Admitting: Cardiology

## 2023-09-29 VITALS — BP 124/84 | HR 84 | Ht 66.0 in | Wt 195.0 lb

## 2023-09-29 DIAGNOSIS — R55 Syncope and collapse: Secondary | ICD-10-CM

## 2023-09-29 DIAGNOSIS — G90A Postural orthostatic tachycardia syndrome (POTS): Secondary | ICD-10-CM | POA: Diagnosis not present

## 2023-09-29 MED ORDER — DILTIAZEM HCL ER COATED BEADS 120 MG PO CP24: 120.0000 mg | ORAL_CAPSULE | Freq: Every day | ORAL | 3 refills | Status: DC

## 2023-09-29 MED ORDER — IPRATROPIUM BROMIDE 0.06 % NA SOLN: 2.0000 | Freq: Four times a day (QID) | NASAL | 12 refills | Status: AC

## 2023-09-29 NOTE — Patient Instructions (Signed)
 Medication Instructions:  Your physician has recommended you make the following change in your medication:  START Diltiazem  120 mg once a day  *If you need a refill on your cardiac medications before your next appointment, please call your pharmacy*  Lab Work: None ordered  If you have any lab test that is abnormal or we need to change your treatment, we will call you to review the results.  Testing/Procedures: None ordered  Follow-Up: At [facility], you and your health needs are our priority.  As part of our continuing mission to provide you with exceptional heart care, our providers are all part of one team.  This team includes your primary Cardiologist (physician) and Advanced Practice Providers or APPs (Physician Assistants and Nurse Practitioners) who all work together to provide you with the care you need, when you need it.  Your next appointment:   3 month(s)  Provider:   Soyla Norton, MD     Thank you for choosing [facility]!!   Maeola Domino, RN [phone]   Other Instructions   [email or web address]

## 2023-10-02 ENCOUNTER — Ambulatory Visit: Payer: Self-pay | Admitting: Medical Oncology

## 2023-10-04 ENCOUNTER — Other Ambulatory Visit: Payer: Self-pay | Admitting: Medical Genetics

## 2023-10-04 ENCOUNTER — Ambulatory Visit: Attending: Family Medicine | Admitting: Physical Therapy

## 2023-10-04 DIAGNOSIS — M2550 Pain in unspecified joint: Secondary | ICD-10-CM | POA: Diagnosis present

## 2023-10-04 DIAGNOSIS — Z7409 Other reduced mobility: Secondary | ICD-10-CM | POA: Insufficient documentation

## 2023-10-04 NOTE — Therapy (Signed)
 OUTPATIENT PHYSICAL THERAPY HYPERMOBILITY EVAL  Patient Name: Amber [name] MRN: [id number] DOB:1980/07/16, 43 y.o., female Today's Date: 10/04/2023  END OF SESSION:  PT End of Session - 10/04/23 1105     Visit Number 1    Number of Visits 17    Date for PT Re-Evaluation 11/29/23    Authorization Type [facility]    PT Start Time 1100    PT Stop Time 1154    PT Time Calculation (min) 54 min    Activity Tolerance Patient tolerated treatment well    Behavior During Therapy WFL for tasks assessed/performed          Past Medical History:  Diagnosis Date   Anxiety    Asthma    Depression    Diabetes (HCC)    GERD (gastroesophageal reflux disease)    History of chicken pox    History of migraine headaches    Migraines    Pituitary adenoma (HCC)    POTS (postural orthostatic tachycardia syndrome)    Seizures (HCC)    Past Surgical History:  Procedure Laterality Date   APPENDECTOMY     CHOLECYSTECTOMY     ESOPHAGEAL MANOMETRY N/A 07/12/2023   Procedure: MANOMETRY, ESOPHAGUS;  Surgeon: San Sandor GAILS, DO;  Location: [facility];  Service: Gastroenterology;  Laterality: N/A;   PH IMPEDANCE STUDY N/A 07/12/2023   Procedure: IMPEDANCE PH STUDY, ESOPHAGUS;  Surgeon: San Sandor GAILS, DO;  Location: [facility];  Service: Gastroenterology;  Laterality: N/A;   Pituiary Tumor     Tumor Removal Pituitary   Patient Active Problem List   Diagnosis Date Noted   EDS (Ehlers-Danlos syndrome) 09/20/2023   Dysautonomia (HCC) 09/20/2023   Bilateral hand swelling 07/25/2023   Palpitations 03/13/2023   Paroxysmal tachycardia (HCC) 03/13/2023   Mixed hyperlipidemia 02/14/2023   Iron  deficiency anemia 01/13/2023   Type 2 diabetes mellitus with hyperglycemia, without long-term current use of insulin  (HCC) 12/28/2022   Asthma 03/23/2021   Hypertriglyceridemia 11/10/2020   Myofascial pain 05/07/2020   Multiple closed fractures of ribs of right side 05/07/2020   Numbness of toes  05/07/2020   Prediabetes 04/28/2020   Pituitary microadenoma (HCC) 04/28/2020   Prolactinoma (HCC) 04/28/2020   Pre-diabetes 12/25/2019   Infertility counseling 12/24/2019   Well woman exam 12/24/2019   Hyperglycemia 06/05/2019   Insomnia 01/07/2019   Bilateral hand pain 11/09/2018   Agoraphobia 01/18/2018   Anxiety with depression 03/24/2017   Migraine 05/11/2016   Pituitary adenoma (HCC) 03/05/2016   Hyperprolactinemia (HCC) 03/02/2016    PCP: Frann Mabel Mt DO  REFERRING PROVIDER: Joane Birmingham MD   REFERRING DIAG: M25.50 (ICD-10-CM) - Hypermobility arthralgia   Rationale for Evaluation and Treatment: Rehabilitation  THERAPY DIAG:  Hypermobility arthralgia  Decreased functional mobility and endurance  ONSET DATE: chronic 5+   SUBJECTIVE:  SUBJECTIVE STATEMENT: Pt reports being worked up for about 5 yrs due to hand issues but does recall subluxing hips as a child.  Primary MD gave her some exercises. She has difficulty with stability and balance during functional activities at home.  Since losing wgt (95 lbs) she has had more joint pain.  Seeing Neuro soon for workup for tingling in feet and hands.  She has difficulty standing and walking (avoids shopping), activity in general.  She does walk the dogs every other day (1/2 mile).  Someday she can't make it the full time.  She has difficulty with lifting, hand function and gripping.  Compression gloves at night. She has cubital tunnel elbow braces but she does not wear them much.  She did OT for only 1 visit but did not return, was recommended to follow up about the hypermobility in her hands at that time.   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 about 1.5 yrs    Has the patient been formally diagnosed with EDS or  HSD? If yes indicate the type EDS-type III recent diagnosis    If no ,does the patient feel they may have EDS or HSD?  Patient reports symptoms and/or instability in the following areas:  - Cervical Spine: [x] Pain [] Instability [] Limited ROM [] Paresthesia  - Thoracic Spine: [] Pain [] Instability  - Lumbar Spine: [x] Pain [] Instability [] Frequent locking/giving out  - Shoulder: [] L [x] R  [x] Subluxation [] Dislocations [x] Pain [] Fatigue  - Elbow: [] L [] R  [] Instability [] Hyperextension [] Pain  - Wrist/Hand: [x] L [x] R  [x] Instability [] Fatigue with use [x] Pain  - Hip: [x] L [x] R  [x] Instability [x] Pain [x] Clicking [] Gait deviations  - Knee: [x] L [x] R  [x] Instability [x] Hyperextension [x] Pain  - Ankle/Foot: [x] L [x] R  [x] Instability [] Frequent sprains Pain , Lt. 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, husband is disabled and she helps him with more executive functioning activities ____________________________   Patient-reported Beighton Score (if known): _____8__/9  Clinician-assessed Beighton Score (today): _____8__/9     PERTINENT HISTORY:  MD note:    MS: bilat hip pain w/ sublux in middle school, bilat hand pain and swelling, bilat shoulder instability R>L Skin/Immune reactions: stretchy skin, chronic urticaria Nervous system: dizziness esp w/ transitioning positions Head/Spine: chronic migraines, insomnia CV: tachycardia when transitioning to stand GI: GERD, chronic diarrhea, intermittent constipation , Genitourinary: none Hands & Feet: swell, paresthesia bilat hands and feet   PAIN:  Are you having pain? Yes: NPRS scale: 7/10 Pain location: hips lateral and  groin Pain description: sore and achy  Aggravating factors: sitting too long Relieving factors: pretzel, Tylenol , heating pad  Are you having pain? Yes: NPRS scale: 5/10  Pain location: Rt shoulder  Pain description: moving around a lot  Aggravating factors: overuse , handling dog  Relieving factors: heating pad    PRECAUTIONS: None  RED FLAGS: Monitor syncope   WEIGHT BEARING RESTRICTIONS: No  FALLS:  Has patient fallen in last 6 months? 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  DIAGNOSTIC FINDINGS:  none  CARDIO/ORTHOSTATICS: Baseline RHR 99 Standing HR 125  Baseline BP 90/62 Standing BP 101/53 min dizziness    PATIENT SURVEYS:  Quick Dash:  QUICK DASH  Please rate your ability do the following activities in the last week by selecting the number below the appropriate response.   Activities Rating  Open a tight or new jar.  3 = Moderate difficulty  Do heavy household chores (e.g., wash walls, floors). 3 = Moderate difficulty  Carry a shopping bag or briefcase 4 = Severe difficulty  Wash your back. 2 = Mild difficulty  Use a knife to cut food. 2 = Mild difficulty  Recreational activities in which you take some force or impact through your arm, shoulder or hand (e.g., golf, hammering, tennis, etc.). 3 = Moderate difficulty  During the past week, to what extent has your arm, shoulder or  hand problem interfered with your normal social activities with family, friends, neighbors or groups?  3 = Moderately  During the past week, were you limited in your work or other regular daily activities as a result of your arm, shoulder or hand problem? 3 = Moderately limited  During the past week, were you limited in your work or other regular daily activities as a result of your arm, shoulder or hand problem? 3 = Moderate  Tingling (pins and needles) in your arm, shoulder or hand. 3 = Moderate  During the past week, how much difficulty have you had sleeping because of the pain in your arm, shoulder or hand?  3 = Moderate difficulty   (A QuickDASH score may not be calculated if there is greater than 1 missing item.)  Quick Dash Disability/Symptom Score: [(sum of 32 (n) responses/11 (n)] x 25 = 47% able   Minimally Clinically Important Difference (MCID): 15-20 points  (Franchignoni, F. et al. (2013). Minimally clinically important difference of the disabilities of the arm, shoulder, and hand outcome measures (DASH) and its shortened version (Quick DASH). Journal of Orthopaedic & Sports Physical Therapy, 44(1), 30-39)   COGNITION: Overall cognitive status: Within functional limits for tasks assessed     SENSATION: Feet tingling at frequently , some N/T in L elbow from surgery    POSTURE: rounded shoulders, forward head, increased thoracic kyphosis, and posterior pelvic tilt  PALPATION: Sore to palpation Rt shoulder grossly  Skin is extensible and very smooth soft    LUMBAR ROM:   AROM eval  Flexion Palms flat   Extension   Right lateral flexion   Left lateral flexion   Right rotation   Left rotation    (Blank rows = not tested)  LOWER EXTREMITY ROM:  PROM WNL, hypermobile IR >ER   LOWER EXTREMITY MMT:    MMT Right eval Left eval  Hip flexion 4+ 4+  Hip extension    Hip abduction    Hip adduction    Hip internal rotation    Hip external rotation    Knee flexion  5 5  Knee extension 5 5  Ankle dorsiflexion    Ankle plantarflexion    Ankle inversion    Ankle eversion     (Blank rows = not tested)  LUMBAR SPECIAL TESTS:  NT  CERVICAL ROM:  NT on eval  Active ROM A/PROM (deg) 10/04/2023  Flexion   Extension   Right lateral flexion   Left lateral flexion   Right rotation   Left rotation    (Blank rows = not tested)  UE ROM: WNL  Pain limits Rt shoulder combo ER/ and abduction  UE MMT:  MMT Right 10/04/2023 Left 10/04/2023  Shoulder flexion 4-pain  4  Shoulder extension    Shoulder abduction 4-pain  4  Shoulder adduction    Shoulder extension    Shoulder internal rotation 4 pain  4+  Shoulder external rotation 4 pain  4+  Middle trapezius    Lower trapezius    Elbow flexion 4 4+  Elbow extension 4 4+  Wrist flexion    Wrist extension    Wrist ulnar deviation    Wrist radial deviation    Wrist pronation    Wrist supination    Grip strength     (Blank rows = not tested)  CERVICAL SPECIAL TESTS:  NT   FUNCTIONAL TESTS:  5 times sit to stand: 18.2 sec   30 seconds chair stand test 9 reps     Beighton Scale Lumbar (_1/1) Knees (_1/2) Elbows (_2/2)  5th digit (_2/2) Thumb (_2/2)  8/9: knee hyper extension  5 deg on Rt and 10 on Lt   TREATMENT DATE:  [facility] Adult PT Treatment:                                                DATE: 10/04/23 Self Care: See pt ed. below, plan of care                                                                                                                                  PATIENT EDUCATION:  Education details:  Posture, alignment, neutral zone and joint protection PNE/Explain pain    Joint inflammation/collagen/ligament laxity, muscular support Stretching vs stabilizing  Person educated: Patient Education method: Explanation Education comprehension: verbalized understanding      HOME EXERCISE PROGRAM: None on eval   ASSESSMENT:  CLINICAL IMPRESSION: Patient is a  43 y.o. 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   OBJECTIVE IMPAIRMENTS: cardiopulmonary status limiting activity, decreased activity tolerance, decreased coordination, decreased endurance, decreased mobility, difficulty walking, decreased ROM, decreased strength, impaired UE functional use, postural dysfunction, obesity, and pain.   ACTIVITY LIMITATIONS: carrying, lifting, bending, sitting, standing, squatting, sleeping, stairs, transfers, reach over head, locomotion level, and caring for others  PARTICIPATION LIMITATIONS: meal prep, cleaning, laundry, interpersonal relationship, shopping, and community activity  PERSONAL FACTORS: Past/current experiences, Time since onset of injury/illness/exacerbation, and 3+ comorbidities: dysautonomia/syncope, MCAS, L elbow post surgery  are also  affecting patient's functional outcome.   REHAB POTENTIAL: Excellent  CLINICAL DECISION MAKING: Evolving/moderate complexity  EVALUATION COMPLEXITY: Moderate   GOALS: Goals reviewed with patient? Yes  SHORT TERM GOALS: Target date: 11/01/2023    Patient will be able to show independence for initial HEP to include posture, core and hip strength and stability.   Baseline: Goal status: INITIAL  2.    Patient will be able to complete functional testing for endurance, balance and strength. Baseline:  Goal status: INITIAL  3.  Pt will report improved awareness of core and posture when performing ADLs  Baseline:  Goal status: INITIAL   LONG TERM GOALS: Target date: 11/29/2023  Patient will be independent with final HEP upon discharge from PT and report consistent benefit following exercise completion.    Baseline:  Goal status: INITIAL  2.  Pt will improve 2 min walk test distance by 50 feet or more with LRAD and no increase in pain.   Baseline:  Goal status:  INITIAL  3.  Patient will be I with concepts of joint protection and stability as it pertains to joint hypermobility. Baseline:  Goal status: INITIAL  4.  Pt will be able to demonstrate good standing balance as evidenced by balance recovery from min to mod dynamic balance challenges.   Baseline:  Goal status: INITIAL  5.  Pt will be able to consistently walk dogs 1/2 mile without undue fatigue and pain < 2/10. Baseline:  Goal status: INITIAL    PLAN:  PT FREQUENCY: 2x/week  PT DURATION: 8 weeks  PLANNED INTERVENTIONS: 97164- PT Re-evaluation, 97750- Physical Performance Testing, 97110-Therapeutic exercises, 97530- Therapeutic activity, [id number]- Neuromuscular re-education, 97535- Self Care, [id number]- Manual therapy, 20560 (1-2 muscles), 20561 (3+ muscles)- Dry Needling, Patient/Family education, Balance training, Taping, Cryotherapy, and Moist heat.  PLAN FOR NEXT SESSION: establish HEP and complete 2 min walk, check BP.HR . Focus on stability training and core control    Hatim Homann, PT 10/04/2023, 1:27 PM   Delon Norma, PT 10/04/23 1:27 PM Phone: [phone] Fax: [phone]

## 2023-10-06 ENCOUNTER — Ambulatory Visit

## 2023-10-06 VITALS — BP 89/61 | HR 77 | Temp 98.5°F | Resp 17

## 2023-10-06 DIAGNOSIS — D509 Iron deficiency anemia, unspecified: Secondary | ICD-10-CM

## 2023-10-06 MED ORDER — SODIUM CHLORIDE 0.9 % IV SOLN
300.0000 mg | Freq: Once | INTRAVENOUS | Status: AC
  Administered 2023-10-06: 300 mg via INTRAVENOUS
  Filled 2023-10-06: qty 300

## 2023-10-06 MED ORDER — SODIUM CHLORIDE 0.9 % IV SOLN: INTRAVENOUS | Status: DC

## 2023-10-06 MED ORDER — DIPHENHYDRAMINE HCL 25 MG PO CAPS
50.0000 mg | ORAL_CAPSULE | Freq: Once | ORAL | Status: AC
  Administered 2023-10-06: 50 mg via ORAL
  Filled 2023-10-06: qty 2

## 2023-10-06 MED ORDER — ACETAMINOPHEN 325 MG PO TABS
650.0000 mg | ORAL_TABLET | Freq: Once | ORAL | Status: AC
  Administered 2023-10-06: 650 mg via ORAL
  Filled 2023-10-06: qty 2

## 2023-10-10 ENCOUNTER — Other Ambulatory Visit: Payer: Self-pay

## 2023-10-10 ENCOUNTER — Other Ambulatory Visit

## 2023-10-10 ENCOUNTER — Encounter: Admitting: Internal Medicine

## 2023-10-10 DIAGNOSIS — Z006 Encounter for examination for normal comparison and control in clinical research program: Secondary | ICD-10-CM

## 2023-10-10 MED ORDER — CROMOLYN SODIUM 100 MG/5ML PO CONC: 100.0000 mg | Freq: Three times a day (TID) | ORAL | 12 refills | Status: AC

## 2023-10-10 NOTE — Telephone Encounter (Signed)
 Patient called due to Cromolyn  sent to pharmacy was on back order and request rx to be sent to [facility] on [address] states she spoke with pharmacy and it is in stock. Patient aware rx sent to [facility]

## 2023-10-11 ENCOUNTER — Encounter: Payer: Self-pay | Admitting: Family Medicine

## 2023-10-11 ENCOUNTER — Ambulatory Visit (INDEPENDENT_AMBULATORY_CARE_PROVIDER_SITE_OTHER): Admitting: Family Medicine

## 2023-10-11 VITALS — BP 105/68 | HR 95 | Temp 98.0°F | Resp 16 | Ht 66.0 in | Wt 193.0 lb

## 2023-10-11 DIAGNOSIS — G629 Polyneuropathy, unspecified: Secondary | ICD-10-CM

## 2023-10-11 DIAGNOSIS — E559 Vitamin D deficiency, unspecified: Secondary | ICD-10-CM | POA: Diagnosis not present

## 2023-10-11 MED ORDER — TRUE VITAMIN D3 1.25 MG (50000 UT) PO CAPS: 50000.0000 [IU] | ORAL_CAPSULE | Freq: Every day | ORAL | 0 refills | Status: DC

## 2023-10-11 MED ORDER — TRUE VITAMIN D3 1.25 MG (50000 UT) PO CAPS: 50000.0000 [IU] | ORAL_CAPSULE | Freq: Every day | ORAL | 0 refills | Status: AC

## 2023-10-11 NOTE — Patient Instructions (Signed)
 Send me a message in 2 days if you don't hear from me.   If you do not hear anything about your referral in the next 1-2 weeks, call our office and ask for an update.  Let us  know if you need anything.

## 2023-10-11 NOTE — Progress Notes (Signed)
 Chief Complaint  Patient presents with   Follow-up    Follow Up Vitamin-D    Subjective: Patient is a 43 y.o. female here for f/u Vit D.  Hx of low Vit D. Is taking Vit D3 4000 u daily. Vit D rising slowly. She cannot tolerate soy and gluten. Concerned she may have a malabsorption issue.   She has a history of neuropathy.  She has a neurologist who manages her migraines.  When she brought up with him, he told her she would need to see another neurologist for this.  She is requesting a referral.  It affects both of her feet.  It is both painful and burning.  Past Medical History:  Diagnosis Date   Anxiety    Asthma    Depression    Diabetes (HCC)    GERD (gastroesophageal reflux disease)    History of chicken pox    History of migraine headaches    Migraines    Pituitary adenoma (HCC)    POTS (postural orthostatic tachycardia syndrome)    Seizures (HCC)     Objective: BP 105/68 (BP Location: Left Arm, Patient Position: Sitting)   Pulse 95   Temp 98 F (36.7 C) (Oral)   Resp 16   Ht 5' 6 (1.676 m)   Wt 193 lb (87.5 kg)   SpO2 98%   BMI 31.15 kg/m  General: Awake, appears stated age Lungs: No accessory muscle use Psych: Age appropriate judgment and insight, normal affect and mood  Assessment and Plan: Vitamin D  deficiency - Plan: Cholecalciferol (TRUE VITAMIN D3) 1.25 MG (50000 UT) capsule, DISCONTINUED: Cholecalciferol (TRUE VITAMIN D3) 1.25 MG (50000 UT) capsule  Neuropathy - Plan: Ambulatory referral to Neurology  Chronic, not controlled.  Discussed the case with the pharmacy team who noted that vitamin D3, at least at their pharmacy, does not have soy or gluten.  Will send this to [facility] with a note requesting that it is gluten free.  We will recheck her levels in 12 weeks. Will refer to the neurology team at her request. The patient voiced understanding and agreement to the plan.  Amber [name] [place], DO 10/11/23  5:15 PM

## 2023-10-12 ENCOUNTER — Ambulatory Visit

## 2023-10-12 DIAGNOSIS — M2550 Pain in unspecified joint: Secondary | ICD-10-CM | POA: Diagnosis not present

## 2023-10-12 DIAGNOSIS — Z7409 Other reduced mobility: Secondary | ICD-10-CM

## 2023-10-12 NOTE — Therapy (Signed)
 OUTPATIENT PHYSICAL THERAPY TREATMENT  Patient Name: Amber [name] MRN: [id number] DOB:1980-03-24, 43 y.o., female Today's Date: 10/13/2023  END OF SESSION:  PT End of Session - 10/12/23 1343     Visit Number 2    Number of Visits 17    Date for PT Re-Evaluation 11/29/23    Authorization Type [facility]    PT Start Time 1400    PT Stop Time 1440    PT Time Calculation (min) 40 min    Activity Tolerance Patient tolerated treatment well    Behavior During Therapy WFL for tasks assessed/performed           Past Medical History:  Diagnosis Date   Anxiety    Asthma    Depression    Diabetes (HCC)    GERD (gastroesophageal reflux disease)    History of chicken pox    History of migraine headaches    Migraines    Pituitary adenoma (HCC)    POTS (postural orthostatic tachycardia syndrome)    Seizures (HCC)    Past Surgical History:  Procedure Laterality Date   APPENDECTOMY     CHOLECYSTECTOMY     ESOPHAGEAL MANOMETRY N/A 07/12/2023   Procedure: MANOMETRY, ESOPHAGUS;  Surgeon: San Sandor GAILS, DO;  Location: [facility];  Service: Gastroenterology;  Laterality: N/A;   PH IMPEDANCE STUDY N/A 07/12/2023   Procedure: IMPEDANCE PH STUDY, ESOPHAGUS;  Surgeon: San Sandor GAILS, DO;  Location: [facility];  Service: Gastroenterology;  Laterality: N/A;   Pituiary Tumor     Tumor Removal Pituitary   Patient Active Problem List   Diagnosis Date Noted   EDS (Ehlers-Danlos syndrome) 09/20/2023   Dysautonomia (HCC) 09/20/2023   Bilateral hand swelling 07/25/2023   Palpitations 03/13/2023   Paroxysmal tachycardia (HCC) 03/13/2023   Mixed hyperlipidemia 02/14/2023   Iron  deficiency anemia 01/13/2023   Type 2 diabetes mellitus with hyperglycemia, without long-term current use of insulin  (HCC) 12/28/2022   Asthma 03/23/2021   Hypertriglyceridemia 11/10/2020   Myofascial pain 05/07/2020   Multiple closed fractures of ribs of right side 05/07/2020   Numbness of toes  05/07/2020   Prediabetes 04/28/2020   Pituitary microadenoma (HCC) 04/28/2020   Prolactinoma (HCC) 04/28/2020   Pre-diabetes 12/25/2019   Infertility counseling 12/24/2019   Well woman exam 12/24/2019   Hyperglycemia 06/05/2019   Insomnia 01/07/2019   Bilateral hand pain 11/09/2018   Agoraphobia 01/18/2018   Anxiety with depression 03/24/2017   Migraine 05/11/2016   Pituitary adenoma (HCC) 03/05/2016   Hyperprolactinemia (HCC) 03/02/2016    PCP: Frann Mabel Mt DO  REFERRING PROVIDER: Joane Birmingham MD   REFERRING DIAG: M25.50 (ICD-10-CM) - Hypermobility arthralgia   Rationale for Evaluation and Treatment: Rehabilitation  THERAPY DIAG:  Hypermobility arthralgia  Decreased functional mobility and endurance  ONSET DATE: chronic 5+   SUBJECTIVE:  SUBJECTIVE STATEMENT: Pt presents to PT with continued R shoulder and bilateral hip pain. Has continued to have dysautonomia symptoms and self monitors.   EVAL: Pt reports being worked up for about 5 yrs due to hand issues but does recall subluxing hips as a child.  Primary MD gave her some exercises. She has difficulty with stability and balance during functional activities at home.  Since losing wgt (95 lbs) she has had more joint pain.  Seeing Neuro soon for workup for tingling in feet and hands.  She has difficulty standing and walking (avoids shopping), activity in general.  She does walk the dogs every other day (1/2 mile).  Someday she can't make it the full time.  She has difficulty with lifting, hand function and gripping.  Compression gloves at night. She has cubital tunnel elbow braces but she does not wear them much.  She did OT for only 1 visit but did not return, was recommended to follow up about the hypermobility in her hands at that  time.   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 about 1.5 yrs    Has the patient been formally diagnosed with EDS or HSD? If yes indicate the type EDS-type III recent diagnosis    If no ,does the patient feel they may have EDS or HSD?  Patient reports symptoms and/or instability in the following areas:  - Cervical Spine: [x] Pain [] Instability [] Limited ROM [] Paresthesia  - Thoracic Spine: [] Pain [] Instability  - Lumbar Spine: [x] Pain [] Instability [] Frequent locking/giving out  - Shoulder: [] L [x] R  [x] Subluxation [] Dislocations [x] Pain [] Fatigue  - Elbow: [] L [] R  [] Instability [] Hyperextension [] Pain  - Wrist/Hand: [x] L [x] R  [x] Instability [] Fatigue with use [x] Pain  - Hip: [x] L [x] R  [x] Instability [x] Pain [x] Clicking [] Gait deviations  - Knee: [x] L [x] R  [x] Instability [x] Hyperextension [x] Pain  - Ankle/Foot: [x] L [x] R  [x] Instability [] Frequent sprains Pain , Lt. 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, husband is disabled and she helps him with more executive functioning activities ____________________________   Patient-reported Beighton Score (if known): _____8__/9  Clinician-assessed Beighton Score (today): _____8__/9     PERTINENT HISTORY:  MD note:    MS: bilat hip pain w/ sublux in middle school, bilat hand pain and swelling, bilat shoulder instability R>L Skin/Immune reactions: stretchy skin, chronic urticaria Nervous system: dizziness esp w/ transitioning positions Head/Spine: chronic migraines, insomnia CV: tachycardia when transitioning to stand GI: GERD, chronic diarrhea, intermittent constipation , Genitourinary:  none Hands & Feet: swell, paresthesia bilat hands and feet   PAIN:  Are you having pain?  Yes: NPRS scale: 7/10 Pain location: hips lateral and groin Pain description: sore and achy  Aggravating factors: sitting too long Relieving factors: pretzel, Tylenol , heating pad  Are you having pain?  Yes: NPRS scale: 5/10  Pain location: Rt shoulder  Pain description: moving around a lot  Aggravating factors: overuse , handling dog  Relieving factors: heating pad    PRECAUTIONS: None  RED FLAGS: Monitor syncope   WEIGHT BEARING RESTRICTIONS: No  FALLS:  Has patient fallen in last 6 months? 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  DIAGNOSTIC FINDINGS:  none  CARDIO/ORTHOSTATICS: Baseline RHR 99 Standing HR 125  Baseline BP 90/62 Standing BP 101/53 min dizziness    PATIENT SURVEYS:  Quick Dash:  QUICK DASH  Please rate your ability do the following activities in the last week by selecting the number below the appropriate response.   Activities Rating  Open a tight or new jar.  3 = Moderate difficulty  Do heavy household chores (e.g., wash walls, floors). 3 = Moderate difficulty  Carry a shopping bag or briefcase 4 = Severe difficulty  Wash your back. 2 = Mild difficulty  Use a knife to cut food. 2 = Mild difficulty  Recreational activities in which you take some force or impact through your  arm, shoulder or hand (e.g., golf, hammering, tennis, etc.). 3 = Moderate difficulty  During the past week, to what extent has your arm, shoulder or hand problem interfered with your normal social activities with family, friends, neighbors or groups?  3 = Moderately  During the past week, were you limited in your work or other regular daily activities as a result of your arm, shoulder or hand problem? 3 = Moderately limited  During the past week, were you limited in your work or other regular daily activities as a result of your arm, shoulder or hand problem? 3 = Moderate  Tingling (pins and needles) in your arm, shoulder or hand. 3 = Moderate  During the past week, how much difficulty have you had sleeping because of the pain in your arm, shoulder or hand?  3 = Moderate difficulty   (A QuickDASH score may not be calculated if there is greater than 1 missing item.)  Quick Dash Disability/Symptom Score: [(sum of 32 (n) responses/11 (n)] x 25 = 47% able   Minimally Clinically Important Difference (MCID): 15-20 points  (Franchignoni, F. et al. (2013). Minimally clinically important difference of the disabilities of the arm, shoulder, and hand outcome measures (DASH) and its shortened version (Quick DASH). Journal of Orthopaedic & Sports Physical Therapy, 44(1), 30-39)   COGNITION: Overall cognitive status: Within functional limits for tasks assessed     SENSATION: Feet tingling at frequently , some N/T in L elbow from surgery    POSTURE: rounded shoulders, forward head, increased thoracic kyphosis, and posterior pelvic tilt  PALPATION: Sore to palpation Rt shoulder grossly  Skin is extensible and very smooth soft    LUMBAR ROM:   AROM eval  Flexion Palms flat   Extension   Right lateral flexion   Left lateral flexion   Right rotation   Left rotation    (Blank rows = not tested)  LOWER EXTREMITY ROM:  PROM WNL, hypermobile IR >ER   LOWER EXTREMITY MMT:    MMT Right eval  Left eval  Hip flexion 4+ 4+  Hip extension    Hip abduction    Hip adduction    Hip internal rotation    Hip external rotation    Knee flexion 5 5  Knee extension 5 5  Ankle dorsiflexion    Ankle  plantarflexion    Ankle inversion    Ankle eversion     (Blank rows = not tested)  LUMBAR SPECIAL TESTS:  NT   CERVICAL ROM:  NT on eval  Active ROM A/PROM (deg) 10/13/2023  Flexion   Extension   Right lateral flexion   Left lateral flexion   Right rotation   Left rotation    (Blank rows = not tested)  UE ROM: WNL  Pain limits Rt shoulder combo ER/ and abduction  UE MMT:  MMT Right 10/13/2023 Left 10/13/2023  Shoulder flexion 4-pain  4  Shoulder extension    Shoulder abduction 4-pain  4  Shoulder adduction    Shoulder extension    Shoulder internal rotation 4 pain  4+  Shoulder external rotation 4 pain  4+  Middle trapezius    Lower trapezius    Elbow flexion 4 4+  Elbow extension 4 4+  Wrist flexion    Wrist extension    Wrist ulnar deviation    Wrist radial deviation    Wrist pronation    Wrist supination    Grip strength     (Blank rows = not tested)  CERVICAL SPECIAL TESTS:  NT   FUNCTIONAL TESTS:  5 times sit to stand: 18.2 sec   30 seconds chair stand test 9 reps  2WMT: 335ft HR -     Beighton Scale Lumbar (_1/1) Knees (_1/2) Elbows (_2/2)  5th digit (_2/2) Thumb (_2/2)  8/9: knee hyper extension  5 deg on Rt and 10 on Lt   TREATMENT: [facility] Adult PT Treatment:                                                DATE: 10/12/2023 - 346ft Supine PPT x 10 - 5 hold PPT into bridge 2x10 S/L clamshell 2x10 RTB Seated scapular retraction x 10 - 5 hold R shoulder ER isometric x 10 - 5 hold Seated bilateral ER RTB 3x10  PATIENT EDUCATION:  Education details:  Posture, alignment, neutral zone and joint protection PNE/Explain pain    Joint inflammation/collagen/ligament laxity, muscular support Stretching vs stabilizing  Person  educated: Patient Education method: Explanation Education comprehension: verbalized understanding   HOME EXERCISE PROGRAM: Access Code: [email or web address] URL: [email or web address] Date: 10/12/2023 Prepared by: Alm Kingdom  Exercises - Supine Posterior Pelvic Tilt  - 1 x daily - 7 x weekly - 2 sets - 10 reps - 5 sec hold - Supine Bridge  - 1 x daily - 7 x weekly - 2 sets - 10 reps - Clamshell with Resistance  - 1 x daily - 7 x weekly - 2 sets - 15 reps - red band hold - Seated Scapular Retraction  - 1 x daily - 7 x weekly - 2 sets - 10 reps - 3-5 sec hold - Standing Isometric Shoulder External Rotation with Doorway and Towel Roll  - 1 x daily - 7 x weekly - 2 sets - 10 reps - 3-5 sec hold - Shoulder External Rotation and Scapular Retraction with Resistance  - 1 x daily - 7 x weekly - 3 sets - 10 reps - red band hold  ASSESSMENT:  CLINICAL IMPRESSION: Pt was able to complete all prescribed exercises and with HR in ranges of 75-122. We focused today on HEP creation with emphasis on core/hip and shoulder stabilization. We will continue  to monitor HR and BP response to activity and progress as able.   OBJECTIVE IMPAIRMENTS: cardiopulmonary status limiting activity, decreased activity tolerance, decreased coordination, decreased endurance, decreased mobility, difficulty walking, decreased ROM, decreased strength, impaired UE functional use, postural dysfunction, obesity, and pain.   ACTIVITY LIMITATIONS: carrying, lifting, bending, sitting, standing, squatting, sleeping, stairs, transfers, reach over head, locomotion level, and caring for others  PARTICIPATION LIMITATIONS: meal prep, cleaning, laundry, interpersonal relationship, shopping, and community activity  PERSONAL FACTORS: Past/current experiences, Time since onset of injury/illness/exacerbation, and 3+ comorbidities: dysautonomia/syncope, MCAS, L elbow post surgery  are also affecting patient's functional outcome.    REHAB POTENTIAL: Excellent  CLINICAL DECISION MAKING: Evolving/moderate complexity  EVALUATION COMPLEXITY: Moderate   GOALS: Goals reviewed with patient? Yes  SHORT TERM GOALS: Target date: 11/01/2023   Patient will be able to show independence for initial HEP to include posture, core and hip strength and stability.   Baseline: Goal status: INITIAL  2.    Patient will be able to complete functional testing for endurance, balance and strength. Baseline:  Goal status: INITIAL  3.  Pt will report improved awareness of core and posture when performing ADLs  Baseline:  Goal status: INITIAL   LONG TERM GOALS: Target date: 11/29/2023  Patient will be independent with final HEP upon discharge from PT and report consistent benefit following exercise completion.    Baseline:  Goal status: INITIAL  2.  Pt will improve 2 min walk test distance by 50 feet or more with LRAD and no increase in pain.   Baseline:  Goal status: INITIAL  3.  Patient will be I with concepts of joint protection and stability as it pertains to joint hypermobility. Baseline:  Goal status: INITIAL  4.  Pt will be able to demonstrate good standing balance as evidenced by balance recovery from min to mod dynamic balance challenges.   Baseline:  Goal status: INITIAL  5.  Pt will be able to consistently walk dogs 1/2 mile without undue fatigue and pain < 2/10. Baseline:  Goal status: INITIAL    PLAN:  PT FREQUENCY: 2x/week  PT DURATION: 8 weeks  PLANNED INTERVENTIONS: 97164- PT Re-evaluation, 97750- Physical Performance Testing, 97110-Therapeutic exercises, 97530- Therapeutic activity, [id number]- Neuromuscular re-education, 97535- Self Care, 02859- Manual therapy, 20560 (1-2 muscles), 20561 (3+ muscles)- Dry Needling, Patient/Family education, Balance training, Taping, Cryotherapy, and Moist heat.  PLAN FOR NEXT SESSION: establish HEP and complete 2 min walk, check BP.HR . Focus on stability training and  core control    Alm JAYSON Kingdom, PT 10/13/2023, 9:20 AM

## 2023-10-13 ENCOUNTER — Inpatient Hospital Stay

## 2023-10-13 VITALS — BP 74/52 | HR 81 | Temp 98.1°F | Resp 17

## 2023-10-13 DIAGNOSIS — D509 Iron deficiency anemia, unspecified: Secondary | ICD-10-CM

## 2023-10-13 MED ORDER — ACETAMINOPHEN 325 MG PO TABS
650.0000 mg | ORAL_TABLET | Freq: Once | ORAL | Status: AC
  Administered 2023-10-13: 650 mg via ORAL
  Filled 2023-10-13: qty 2

## 2023-10-13 MED ORDER — SODIUM CHLORIDE 0.9 % IV SOLN: INTRAVENOUS | Status: DC

## 2023-10-13 MED ORDER — SODIUM CHLORIDE 0.9 % IV SOLN
300.0000 mg | Freq: Once | INTRAVENOUS | Status: AC
  Administered 2023-10-13: 300 mg via INTRAVENOUS
  Filled 2023-10-13: qty 300

## 2023-10-13 MED ORDER — DIPHENHYDRAMINE HCL 25 MG PO CAPS
50.0000 mg | ORAL_CAPSULE | Freq: Once | ORAL | Status: AC
  Administered 2023-10-13: 50 mg via ORAL
  Filled 2023-10-13: qty 2

## 2023-10-13 NOTE — Patient Instructions (Signed)

## 2023-10-15 ENCOUNTER — Emergency Department (HOSPITAL_BASED_OUTPATIENT_CLINIC_OR_DEPARTMENT_OTHER)
Admission: EM | Admit: 2023-10-15 | Discharge: 2023-10-15 | Disposition: A | Discharge status: Home / Self Care | Attending: Emergency Medicine | Admitting: Emergency Medicine

## 2023-10-15 ENCOUNTER — Other Ambulatory Visit: Payer: Self-pay

## 2023-10-15 ENCOUNTER — Encounter (HOSPITAL_BASED_OUTPATIENT_CLINIC_OR_DEPARTMENT_OTHER): Payer: Self-pay | Admitting: Emergency Medicine

## 2023-10-15 DIAGNOSIS — Z7952 Long term (current) use of systemic steroids: Secondary | ICD-10-CM | POA: Diagnosis not present

## 2023-10-15 DIAGNOSIS — Z794 Long term (current) use of insulin: Secondary | ICD-10-CM | POA: Insufficient documentation

## 2023-10-15 DIAGNOSIS — E119 Type 2 diabetes mellitus without complications: Secondary | ICD-10-CM | POA: Diagnosis not present

## 2023-10-15 DIAGNOSIS — T7840XA Allergy, unspecified, initial encounter: Secondary | ICD-10-CM | POA: Diagnosis present

## 2023-10-15 DIAGNOSIS — T782XXA Anaphylactic shock, unspecified, initial encounter: Secondary | ICD-10-CM | POA: Insufficient documentation

## 2023-10-15 DIAGNOSIS — Z91010 Allergy to peanuts: Secondary | ICD-10-CM | POA: Diagnosis not present

## 2023-10-15 DIAGNOSIS — J45909 Unspecified asthma, uncomplicated: Secondary | ICD-10-CM | POA: Diagnosis not present

## 2023-10-15 DIAGNOSIS — Z7951 Long term (current) use of inhaled steroids: Secondary | ICD-10-CM | POA: Diagnosis not present

## 2023-10-15 LAB — CBC WITH DIFFERENTIAL/PLATELET
Abs Immature Granulocytes: 0.04 K/uL (ref 0.00–0.07)
Basophils Absolute: 0 K/uL (ref 0.0–0.1)
Basophils Relative: 0 %
Eosinophils Absolute: 0 K/uL (ref 0.0–0.5)
Eosinophils Relative: 0 %
HCT: 34.2 % — ABNORMAL LOW (ref 36.0–46.0)
Hemoglobin: 11.6 g/dL — ABNORMAL LOW (ref 12.0–15.0)
Immature Granulocytes: 0 %
Lymphocytes Relative: 12 %
Lymphs Abs: 1.2 K/uL (ref 0.7–4.0)
MCH: 29.7 pg (ref 26.0–34.0)
MCHC: 33.9 g/dL (ref 30.0–36.0)
MCV: 87.7 fL (ref 80.0–100.0)
Monocytes Absolute: 0.5 K/uL (ref 0.1–1.0)
Monocytes Relative: 4 %
Neutro Abs: 8.6 K/uL — ABNORMAL HIGH (ref 1.7–7.7)
Neutrophils Relative %: 84 %
Platelets: 320 K/uL (ref 150–400)
RBC: 3.9 MIL/uL (ref 3.87–5.11)
RDW: 13.8 % (ref 11.5–15.5)
WBC: 10.4 K/uL (ref 4.0–10.5)
nRBC: 0 % (ref 0.0–0.2)

## 2023-10-15 LAB — COMPREHENSIVE METABOLIC PANEL WITH GFR
ALT: 6 U/L (ref 0–44)
AST: 14 U/L — ABNORMAL LOW (ref 15–41)
Albumin: 3.9 g/dL (ref 3.5–5.0)
Alkaline Phosphatase: 30 U/L — ABNORMAL LOW (ref 38–126)
Anion gap: 9 (ref 5–15)
BUN: 10 mg/dL (ref 6–20)
CO2: 20 mmol/L — ABNORMAL LOW (ref 22–32)
Calcium: 8.7 mg/dL — ABNORMAL LOW (ref 8.9–10.3)
Chloride: 111 mmol/L (ref 98–111)
Creatinine, Ser: 1.15 mg/dL — ABNORMAL HIGH (ref 0.44–1.00)
GFR, Estimated: >60 mL/min (ref >60)
Glucose, Bld: 90 mg/dL (ref 70–99)
Potassium: 3.8 mmol/L (ref 3.5–5.1)
Sodium: 141 mmol/L (ref 135–145)
Total Bilirubin: 0.3 mg/dL (ref 0.0–1.2)
Total Protein: 6 g/dL — ABNORMAL LOW (ref 6.5–8.1)

## 2023-10-15 LAB — HCG, SERUM, QUALITATIVE: Preg, Serum: NEGATIVE

## 2023-10-15 MED ORDER — LACTATED RINGERS IV BOLUS
1000.0000 mL | Freq: Once | INTRAVENOUS | Status: AC
  Administered 2023-10-15: 1000 mL via INTRAVENOUS

## 2023-10-15 MED ORDER — DIPHENHYDRAMINE HCL 50 MG/ML IJ SOLN
25.0000 mg | Freq: Once | INTRAMUSCULAR | Status: AC
  Administered 2023-10-15: 25 mg via INTRAVENOUS
  Filled 2023-10-15: qty 1

## 2023-10-15 MED ORDER — FAMOTIDINE IN NACL 20-0.9 MG/50ML-% IV SOLN
20.0000 mg | Freq: Once | INTRAVENOUS | Status: AC
  Administered 2023-10-15: 20 mg via INTRAVENOUS
  Filled 2023-10-15: qty 50

## 2023-10-15 MED ORDER — EPINEPHRINE PF 1 MG/ML IJ SOLN: 0.3000 mg | Freq: Once | INTRAMUSCULAR | Status: AC

## 2023-10-15 MED ORDER — EPINEPHRINE PF 1 MG/ML IJ SOLN
INTRAMUSCULAR | Status: AC
  Administered 2023-10-15: 0.3 mg via INTRAMUSCULAR
  Filled 2023-10-15: qty 1

## 2023-10-15 MED ORDER — METHYLPREDNISOLONE SODIUM SUCC 125 MG IJ SOLR
125.0000 mg | Freq: Once | INTRAMUSCULAR | Status: AC
  Administered 2023-10-15: 125 mg via INTRAVENOUS
  Filled 2023-10-15: qty 2

## 2023-10-15 MED ORDER — PREDNISONE 10 MG PO TABS: 30.0000 mg | ORAL_TABLET | Freq: Every day | ORAL | 0 refills | Status: AC

## 2023-10-15 NOTE — ED Notes (Signed)
 Patient advised her BP is usually low 90s and high 80 systolics.

## 2023-10-15 NOTE — ED Notes (Signed)
 RT assessed patient in triage. BBS clear, VS WNL, and no distress noted. Stated she felt like she was having some throat swelling. RN aware

## 2023-10-15 NOTE — ED Notes (Signed)
 I cycled several blood pressures and laid eyes on the patient. She was still sitting upright in the room without incident and had no new complaints. The pt was pale but denied new complaints and actually felt better. I laid her down and obtained another BP and she was found to be continulally hypotensive. I notified an EDP and obtained a manual an more meds were ordered.

## 2023-10-15 NOTE — ED Provider Notes (Signed)
  EMERGENCY DEPARTMENT AT [facility] [place] Provider Note   CSN: [id number] Arrival date & time: 10/15/23  1727     Patient presents with: Allergic Reaction   Amber [name] is a 43 y.o. female.  {Add pertinent medical, surgical, social history, OB history to HPI:32947} HPI     Prior to Admission medications   Medication Sig Start Date End Date Taking? Authorizing Provider  cabergoline  (DOSTINEX ) 0.5 MG tablet 2.5 mg weekly (Two tabs on Mondays, 1 tab on wednesdays and 2 tabs on Fridays) 01/09/23   Shamleffer, Ibtehal Jaralla, MD  cetirizine (ZYRTEC) 10 MG chewable tablet Chew 10 mg by mouth in the morning and at bedtime.    [provider]  Cholecalciferol (TRUE VITAMIN D3) 1.25 MG (50000 UT) capsule Take 1 capsule (50,000 Units total) by mouth daily. 10/11/23   Frann Mabel Mt, DO  Continuous Glucose Sensor (DEXCOM G7 SENSOR) MISC USE 1 SENSOR TOPICALLY EVERY 10 DAYS 07/27/23   Frann Mabel Mt, DO  cromolyn  (GASTROCROM ) 100 MG/5ML solution Take 5 mLs (100 mg total) by mouth 4 (four) times daily -  before meals and at bedtime. 10/10/23   Lorin Norris, MD  cyanocobalamin  (VITAMIN B12) 1000 MCG/ML injection Inject 1 mL (1,000 mcg total) into the muscle every 14 (fourteen) days. 08/31/23   Tonette Lauraine HERO, PA-C  dexlansoprazole  (DEXILANT ) 60 MG capsule Take 1 capsule (60 mg total) by mouth daily. 07/24/23   Cirigliano, Vito V, DO  diltiazem  (CARDIZEM  CD) 120 MG 24 hr capsule Take 1 capsule (120 mg total) by mouth daily. 09/29/23 12/28/23  Camnitz, Soyla Lunger, MD  EPINEPHrine  (EPIPEN  2-PAK) 0.3 mg/0.3 mL IJ SOAJ injection Inject 0.3 mg into the muscle as needed for anaphylaxis. 06/14/23   Frann Mabel Mt, DO  Erenumab -aooe (AIMOVIG ) 140 MG/ML SOAJ Inject 140 mg as directed subcutaneously once a month 07/04/23   Skeet, Adam R, DO  escitalopram  (LEXAPRO ) 10 MG tablet Take 1 tablet (10 mg total) by mouth daily. 07/27/23   Frann Mabel Mt, DO   famotidine  (PEPCID ) 20 MG tablet Take 20 mg by mouth 2 (two) times daily.    [provider]  fenofibrate  (TRICOR ) 145 MG tablet Take 1 tablet (145 mg total) by mouth daily. 05/22/23   Frann Mabel Mt, DO  folic acid  (FOLVITE ) 1 MG tablet Take 1 tablet (1 mg total) by mouth daily. 05/23/23   Tonette Lauraine HERO, PA-C  glucose blood (ACCU-CHEK GUIDE TEST) test strip Use to Check Blood Suagr 09/06/23   Frann Mabel Mt, DO  glucose blood (ACCU-CHEK GUIDE) test strip 1 each by Other route daily in the afternoon. Use as instructed 01/06/23   Shamleffer, Ibtehal Jaralla, MD  ipratropium (ATROVENT ) 0.06 % nasal spray Place 2 sprays into both nostrils 4 (four) times daily. 09/29/23   Lorin Norris, MD  Menaquinone-7 (VITAMIN K2) 100 MCG CAPS Take 1 Dose by mouth daily.    [provider]  omalizumab  (XOLAIR ) 150 MG/ML prefilled syringe Inject 300 mg into the skin every 28 (twenty-eight) days. 06/22/23   Lorin Norris, MD  ondansetron  (ZOFRAN -ODT) 8 MG disintegrating tablet Take 1 tablet (8 mg total) by mouth every 8 (eight) hours as needed for nausea or vomiting. 09/06/23   Skeet Juliene SAUNDERS, DO  QUEtiapine  (SEROQUEL ) 300 MG tablet Take 1 tablet (300 mg total) by mouth at bedtime. 05/22/23   Frann Mabel Mt, DO  Riboflavin 400 MG CAPS Take 400 mg by mouth daily.    [provider]  Rimegepant Sulfate (  NURTEC) 75 MG TBDP Take 1 tablet (75 mg total) by mouth as needed (Daily as needed for a Migraine). Maximum 1 tablet in 24 hours.  Quantity 8. 09/06/23   Skeet Juliene SAUNDERS, DO  rosuvastatin  (CRESTOR ) 20 MG tablet Take 1 tablet (20 mg total) by mouth daily. 07/27/23   Frann Mabel Mt, DO  Syringe/Needle, Disp, 25G X 1 1 ML MISC 1 Dose by Does not apply route every 14 (fourteen) days. 08/31/23   Tonette Lauraine HERO, PA-C  tirzepatide  (MOUNJARO ) 15 MG/0.5ML Pen Inject 15 mg into the skin once a week. 09/06/23   Frann Mabel Mt, DO  topiramate  (TOPAMAX ) 100 MG tablet  Take 1 tablet (100 mg total) by mouth at bedtime. 09/06/23   Skeet Juliene SAUNDERS, DO  traMADol  (ULTRAM ) 50 MG tablet Take 1 tablet (50 mg total) by mouth every 6 (six) hours as needed. 09/15/23   Skeet Juliene SAUNDERS, DO    Allergies: Bee venom, Chlorhexidine, Ibuprofen, Iodine, Peanut-containing drug products, Sesame seed (diagnostic), Shellfish allergy , Soybean-containing drug products, Sulfa antibiotics, Triptans, Lunesta  [eszopiclone ], Almond (diagnostic), Aspirin, Cashew nut (anacardium occidentale) skin test, Hazelnut (filbert), Nsaids, Tape, Walnut, and Wheat    Review of Systems  Updated Vital Signs BP 97/67   Pulse 92   Temp 98.1 F (36.7 C) (Oral)   Resp 14   Ht 5' 6 (1.676 m)   Wt 87.1 kg   LMP 10/01/2023 (Approximate)   SpO2 96%   BMI 30.99 kg/m   Physical Exam  (all labs ordered are listed, but only abnormal results are displayed) Labs Reviewed  CBC WITH DIFFERENTIAL/PLATELET - Abnormal; Notable for the following components:      Result Value   Hemoglobin 11.6 (*)    HCT 34.2 (*)    Neutro Abs 8.6 (*)    All other components within normal limits  COMPREHENSIVE METABOLIC PANEL WITH GFR - Abnormal; Notable for the following components:   CO2 20 (*)    Creatinine, Ser 1.15 (*)    Calcium  8.7 (*)    Total Protein 6.0 (*)    AST 14 (*)    Alkaline Phosphatase 30 (*)    All other components within normal limits  HCG, SERUM, QUALITATIVE    EKG: None  Radiology: No results found.  {Document cardiac monitor, telemetry assessment procedure when appropriate:32947} Procedures   Medications Ordered in the ED  methylPREDNISolone  sodium succinate (SOLU-MEDROL ) 125 mg/2 mL injection 125 mg (125 mg Intravenous Given 10/15/23 1752)  diphenhydrAMINE  (BENADRYL ) injection 25 mg (25 mg Intravenous Given 10/15/23 1753)  famotidine  (PEPCID ) IVPB 20 mg premix (0 mg Intravenous Stopped 10/15/23 1834)  lactated ringers  bolus 1,000 mL (0 mLs Intravenous Stopped 10/15/23 2044)  EPINEPHrine   (ADRENALIN ) 0.3 mg (0.3 mg Intramuscular Given 10/15/23 1918)  lactated ringers  bolus 1,000 mL (0 mLs Intravenous Stopped 10/15/23 2044)      {Click here for ABCD2, HEART and other calculators REFRESH Note before signing:1}                              Medical Decision Making Amount and/or Complexity of Data Reviewed Labs: ordered.  Risk Prescription drug management.   ***  {Document critical care time when appropriate  Document review of labs and clinical decision tools ie CHADS2VASC2, etc  Document your independent review of radiology images and any outside records  Document your discussion with family members, caretakers and with consultants  Document social determinants of health affecting pt's  care  Document your decision making why or why not admission, treatments were needed:32947:::1}   Final diagnoses:  None    ED Discharge Orders     None

## 2023-10-15 NOTE — ED Notes (Signed)
 Pt advised she was getting out a garden hose and was stung on her back, saw wasps and went inside immediately and self administered an EpiPen  to her thigh. Began to have SOB, a tight throat and shakiness afterwards. No rash, GI upset, or oral swelling noted. No wheezing noted either before or after EpiPen . Pt came in for followup.

## 2023-10-15 NOTE — ED Triage Notes (Signed)
 Pt POV reports being stung by wasp, administered epi pen at 1704.    Pt c/o throat tightness. Pt speaking in full sentences, respirations equal and unlabored.

## 2023-10-16 ENCOUNTER — Encounter: Payer: Self-pay | Admitting: Cardiology

## 2023-10-16 NOTE — Telephone Encounter (Signed)
 POTS pt seen by Camnitz on 09/29/23 and started on Diltiazem : 2. 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 She has significant palpitations when she stands up as well as syncope and brain fog. She understands there is no immediate fix for this. She has been hydrating well. We will give her exercise regimen to follow as well as start diltiazem  120 mg daily.   Pt writing in to say the 120mg  is dropping BP too much, reportedly systolic. Would like to know if we can change to something else. Routing to [employer] for input.

## 2023-10-18 ENCOUNTER — Encounter: Payer: Self-pay | Admitting: Neurology

## 2023-10-19 ENCOUNTER — Ambulatory Visit

## 2023-10-19 NOTE — Therapy (Incomplete)
 OUTPATIENT PHYSICAL THERAPY TREATMENT  Patient Name: Amber [name] MRN: [id number] DOB:1980-09-26, 43 y.o., female Today's Date: 10/19/2023  END OF SESSION:     Past Medical History:  Diagnosis Date   Anxiety    Asthma    Depression    Diabetes (HCC)    GERD (gastroesophageal reflux disease)    History of chicken pox    History of migraine headaches    Migraines    Pituitary adenoma (HCC)    POTS (postural orthostatic tachycardia syndrome)    Seizures (HCC)    Past Surgical History:  Procedure Laterality Date   APPENDECTOMY     CHOLECYSTECTOMY     ESOPHAGEAL MANOMETRY N/A 07/12/2023   Procedure: MANOMETRY, ESOPHAGUS;  Surgeon: San Sandor GAILS, DO;  Location: [facility];  Service: Gastroenterology;  Laterality: N/A;   PH IMPEDANCE STUDY N/A 07/12/2023   Procedure: IMPEDANCE PH STUDY, ESOPHAGUS;  Surgeon: San Sandor GAILS, DO;  Location: [facility];  Service: Gastroenterology;  Laterality: N/A;   Pituiary Tumor     Tumor Removal Pituitary   Patient Active Problem List   Diagnosis Date Noted   EDS (Ehlers-Danlos syndrome) 09/20/2023   Dysautonomia (HCC) 09/20/2023   Bilateral hand swelling 07/25/2023   Palpitations 03/13/2023   Paroxysmal tachycardia (HCC) 03/13/2023   Mixed hyperlipidemia 02/14/2023   Iron  deficiency anemia 01/13/2023   Type 2 diabetes mellitus with hyperglycemia, without long-term current use of insulin  (HCC) 12/28/2022   Asthma 03/23/2021   Hypertriglyceridemia 11/10/2020   Myofascial pain 05/07/2020   Multiple closed fractures of ribs of right side 05/07/2020   Numbness of toes 05/07/2020   Prediabetes 04/28/2020   Pituitary microadenoma (HCC) 04/28/2020   Prolactinoma (HCC) 04/28/2020   Pre-diabetes 12/25/2019   Infertility counseling 12/24/2019   Well woman exam 12/24/2019   Hyperglycemia 06/05/2019   Insomnia 01/07/2019   Bilateral hand pain 11/09/2018   Agoraphobia 01/18/2018   Anxiety with depression 03/24/2017    Migraine 05/11/2016   Pituitary adenoma (HCC) 03/05/2016   Hyperprolactinemia (HCC) 03/02/2016    PCP: Frann Mabel Mt DO  REFERRING PROVIDER: Joane Birmingham MD   REFERRING DIAG: 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  ONSET DATE: chronic 5+   SUBJECTIVE:                                                                                                                                                                                           SUBJECTIVE STATEMENT: ***  EVAL: Pt reports being worked up for about 5 yrs due to hand issues but does recall subluxing hips  as a child.  Primary MD gave her some exercises. She has difficulty with stability and balance during functional activities at home.  Since losing wgt (95 lbs) she has had more joint pain.  Seeing Neuro soon for workup for tingling in feet and hands.  She has difficulty standing and walking (avoids shopping), activity in general.  She does walk the dogs every other day (1/2 mile).  Someday she can't make it the full time.  She has difficulty with lifting, hand function and gripping.  Compression gloves at night. She has cubital tunnel elbow braces but she does not wear them much.  She did OT for only 1 visit but did not return, was recommended to follow up about the hypermobility in her hands at that time.   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 about 1.5 yrs    Has the patient been formally diagnosed with EDS or HSD? If yes indicate the type EDS-type III recent diagnosis    If no ,does the patient feel they may have EDS or HSD?  Patient reports symptoms and/or instability in the following areas:  - Cervical Spine: [x] Pain [] Instability [] Limited ROM [] Paresthesia  - Thoracic Spine: [] Pain [] Instability  - Lumbar Spine: [x] Pain [] Instability [] Frequent locking/giving out  - Shoulder: [] L [x] R   [x] Subluxation [] Dislocations [x] Pain [] Fatigue  - Elbow: [] L [] R  [] Instability [] Hyperextension [] Pain  - Wrist/Hand: [x] L [x] R  [x] Instability [] Fatigue with use [x] Pain  - Hip: [x] L [x] R  [x] Instability [x] Pain [x] Clicking [] Gait deviations  - Knee: [x] L [x] R  [x] Instability [x] Hyperextension [x] Pain  - Ankle/Foot: [x] L [x] R  [x] Instability [] Frequent sprains Pain , Lt. 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, husband is disabled and she helps him with more executive functioning activities ____________________________   Patient-reported Beighton Score (if known): _____8__/9  Clinician-assessed Beighton Score (today): _____8__/9     PERTINENT HISTORY:  MD note:    MS: bilat hip pain w/ sublux in middle school, bilat hand pain and swelling, bilat shoulder instability R>L Skin/Immune reactions: stretchy skin, chronic urticaria Nervous system: dizziness esp w/ transitioning positions Head/Spine: chronic migraines, insomnia CV: tachycardia when transitioning to stand GI: GERD, chronic diarrhea, intermittent constipation , Genitourinary: none Hands & Feet: swell, paresthesia bilat hands and feet   PAIN:  Are you having pain?  Yes: NPRS scale: 7/10 Pain location: hips lateral and groin Pain description: sore and achy  Aggravating factors: sitting too long Relieving factors: pretzel, Tylenol , heating pad  Are you having pain?  Yes: NPRS scale: 5/10  Pain location: Rt shoulder  Pain description: moving around a lot  Aggravating factors: overuse , handling dog  Relieving factors: heating pad    PRECAUTIONS: None  RED FLAGS: Monitor syncope   WEIGHT  BEARING RESTRICTIONS: No  FALLS:  Has patient fallen in last 6 months? 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  DIAGNOSTIC FINDINGS:  none  CARDIO/ORTHOSTATICS: Baseline RHR 99 Standing HR 125  Baseline BP 90/62 Standing BP 101/53 min dizziness    PATIENT SURVEYS:  Quick Dash:  QUICK DASH  Please rate your ability do the following activities in the last week by selecting the number below the appropriate response.   Activities Rating  Open a tight or new jar.  3 = Moderate difficulty  Do heavy household chores (e.g., wash walls, floors). 3 = Moderate difficulty  Carry a shopping bag or briefcase 4 = Severe difficulty  Wash your back. 2 = Mild difficulty  Use a knife to cut food. 2 = Mild difficulty  Recreational activities in which you take some force or impact through your arm, shoulder or hand (e.g., golf, hammering, tennis, etc.). 3 = Moderate difficulty  During the past week, to what extent has your arm, shoulder or hand problem interfered with your normal social activities with family, friends, neighbors or groups?  3 = Moderately  During the past week, were you limited in your work or other regular daily activities as a result of your arm, shoulder or hand problem? 3 = Moderately limited  During the past week, were you limited in your work or other regular daily activities as a result of your  arm, shoulder or hand problem? 3 = Moderate  Tingling (pins and needles) in your arm, shoulder or hand. 3 = Moderate  During the past week, how much difficulty have you had sleeping because of the pain in your arm, shoulder or hand?  3 = Moderate difficulty   (A QuickDASH score may not be calculated if there is greater than 1 missing item.)  Quick Dash Disability/Symptom Score: [(sum of 32 (n) responses/11 (n)] x 25 = 47% able   Minimally Clinically Important Difference (MCID): 15-20 points  (Franchignoni, F. et al. (2013). Minimally clinically important difference of the disabilities of the arm, shoulder, and hand outcome measures (DASH) and its shortened version (Quick DASH). Journal of Orthopaedic & Sports Physical Therapy, 44(1), 30-39)   COGNITION: Overall cognitive status: Within functional limits for tasks assessed     SENSATION: Feet tingling at frequently , some N/T in L elbow from surgery    POSTURE: rounded shoulders, forward head, increased thoracic kyphosis, and posterior pelvic tilt  PALPATION: Sore to palpation Rt shoulder grossly  Skin is extensible and very smooth soft    LUMBAR ROM:   AROM eval  Flexion Palms flat   Extension   Right lateral flexion   Left lateral flexion   Right rotation   Left rotation    (Blank rows = not tested)  LOWER EXTREMITY ROM:  PROM WNL, hypermobile IR >ER   LOWER EXTREMITY MMT:    MMT Right eval Left eval  Hip flexion 4+ 4+  Hip extension    Hip abduction    Hip adduction    Hip internal rotation    Hip external rotation    Knee flexion 5 5  Knee extension 5 5  Ankle dorsiflexion    Ankle plantarflexion    Ankle inversion    Ankle eversion     (Blank rows = not tested)  LUMBAR SPECIAL TESTS:  NT   CERVICAL ROM:  NT on eval  Active ROM A/PROM (deg) 10/19/2023  Flexion   Extension  Right lateral flexion   Left lateral flexion   Right rotation   Left rotation    (Blank rows = not tested)  UE ROM:  WNL  Pain limits Rt shoulder combo ER/ and abduction  UE MMT:  MMT Right eval Left eval  Shoulder flexion 4-pain  4  Shoulder extension    Shoulder abduction 4-pain  4  Shoulder adduction    Shoulder extension    Shoulder internal rotation 4 pain  4+  Shoulder external rotation 4 pain  4+  Middle trapezius    Lower trapezius    Elbow flexion 4 4+  Elbow extension 4 4+  Wrist flexion    Wrist extension    Wrist ulnar deviation    Wrist radial deviation    Wrist pronation    Wrist supination    Grip strength     (Blank rows = not tested)  CERVICAL SPECIAL TESTS:  NT   FUNCTIONAL TESTS:  5 times sit to stand: 18.2 sec   30 seconds chair stand test 9 reps  2WMT: 33ft HR -     Beighton Scale Lumbar (_1/1) Knees (_1/2) Elbows (_2/2)  5th digit (_2/2) Thumb (_2/2)  8/9: knee hyper extension  5 deg on Rt and 10 on Lt   TREATMENT: [facility] Adult PT Treatment:                                                DATE: 10/12/2023 - 360ft Supine PPT x 10 - 5 hold PPT into bridge 2x10 S/L clamshell 2x10 RTB Seated scapular retraction x 10 - 5 hold R shoulder ER isometric x 10 - 5 hold Seated bilateral ER RTB 3x10  PATIENT EDUCATION:  Education details:  Posture, alignment, neutral zone and joint protection PNE/Explain pain    Joint inflammation/collagen/ligament laxity, muscular support Stretching vs stabilizing  Person educated: Patient Education method: Explanation Education comprehension: verbalized understanding   HOME EXERCISE PROGRAM: Access Code: [email or web address] URL: [email or web address] Date: 10/12/2023 Prepared by: Alm Kingdom  Exercises - Supine Posterior Pelvic Tilt  - 1 x daily - 7 x weekly - 2 sets - 10 reps - 5 sec hold - Supine Bridge  - 1 x daily - 7 x weekly - 2 sets - 10 reps - Clamshell with Resistance  - 1 x daily - 7 x weekly - 2 sets - 15 reps - red band hold - Seated Scapular Retraction  - 1 x daily - 7 x weekly - 2 sets  - 10 reps - 3-5 sec hold - Standing Isometric Shoulder External Rotation with Doorway and Towel Roll  - 1 x daily - 7 x weekly - 2 sets - 10 reps - 3-5 sec hold - Shoulder External Rotation and Scapular Retraction with Resistance  - 1 x daily - 7 x weekly - 3 sets - 10 reps - red band hold  ASSESSMENT:  CLINICAL IMPRESSION: Pt was able to complete all prescribed exercises and with HR in ranges of 75-122. We focused today on HEP creation with emphasis on core/hip and shoulder stabilization. We will continue to monitor HR and BP response to activity and progress as able.   OBJECTIVE IMPAIRMENTS: cardiopulmonary status limiting activity, decreased activity tolerance, decreased coordination, decreased endurance, decreased mobility, difficulty walking, decreased ROM, decreased strength, impaired UE functional use, postural dysfunction, obesity, and pain.  ACTIVITY LIMITATIONS: carrying, lifting, bending, sitting, standing, squatting, sleeping, stairs, transfers, reach over head, locomotion level, and caring for others  PARTICIPATION LIMITATIONS: meal prep, cleaning, laundry, interpersonal relationship, shopping, and community activity  PERSONAL FACTORS: Past/current experiences, Time since onset of injury/illness/exacerbation, and 3+ comorbidities: dysautonomia/syncope, MCAS, L elbow post surgery  are also affecting patient's functional outcome.   REHAB POTENTIAL: Excellent  CLINICAL DECISION MAKING: Evolving/moderate complexity  EVALUATION COMPLEXITY: Moderate   GOALS: Goals reviewed with patient? Yes  SHORT TERM GOALS: Target date: 11/01/2023   Patient will be able to show independence for initial HEP to include posture, core and hip strength and stability.   Baseline: Goal status: INITIAL  2.    Patient will be able to complete functional testing for endurance, balance and strength. Baseline:  Goal status: INITIAL  3.  Pt will report improved awareness of core and posture  when performing ADLs  Baseline:  Goal status: INITIAL   LONG TERM GOALS: Target date: 11/29/2023  Patient will be independent with final HEP upon discharge from PT and report consistent benefit following exercise completion.    Baseline:  Goal status: INITIAL  2.  Pt will improve 2 min walk test distance by 50 feet or more with LRAD and no increase in pain.   Baseline:  Goal status: INITIAL  3.  Patient will be I with concepts of joint protection and stability as it pertains to joint hypermobility. Baseline:  Goal status: INITIAL  4.  Pt will be able to demonstrate good standing balance as evidenced by balance recovery from min to mod dynamic balance challenges.   Baseline:  Goal status: INITIAL  5.  Pt will be able to consistently walk dogs 1/2 mile without undue fatigue and pain < 2/10. Baseline:  Goal status: INITIAL    PLAN:  PT FREQUENCY: 2x/week  PT DURATION: 8 weeks  PLANNED INTERVENTIONS: [id number]- PT Re-evaluation, 97750- Physical Performance Testing, 97110-Therapeutic exercises, 97530- Therapeutic activity, [id number]- Neuromuscular re-education, 97535- Self Care, [id number]- Manual therapy, [id number] (1-2 muscles), [id number] (3+ muscles)- Dry Needling, Patient/Family education, Balance training, Taping, Cryotherapy, and Moist heat.  PLAN FOR NEXT SESSION: establish HEP and complete 2 min walk, check BP.HR . Focus on stability training and core control    Alm JAYSON Kingdom, PT 10/19/2023, 12:56 PM

## 2023-10-20 ENCOUNTER — Inpatient Hospital Stay: Attending: Medical Oncology

## 2023-10-20 VITALS — BP 78/50 | HR 79 | Temp 98.3°F | Resp 17

## 2023-10-20 DIAGNOSIS — D509 Iron deficiency anemia, unspecified: Secondary | ICD-10-CM | POA: Insufficient documentation

## 2023-10-20 LAB — GENECONNECT MOLECULAR SCREEN: Genetic Analysis Overall Interpretation: NEGATIVE

## 2023-10-20 MED ORDER — DIPHENHYDRAMINE HCL 25 MG PO CAPS
50.0000 mg | ORAL_CAPSULE | Freq: Once | ORAL | Status: AC
  Administered 2023-10-20: 50 mg via ORAL
  Filled 2023-10-20: qty 2

## 2023-10-20 MED ORDER — ACETAMINOPHEN 325 MG PO TABS
650.0000 mg | ORAL_TABLET | Freq: Once | ORAL | Status: AC
  Administered 2023-10-20: 650 mg via ORAL
  Filled 2023-10-20: qty 2

## 2023-10-20 MED ORDER — SODIUM CHLORIDE 0.9 % IV SOLN: INTRAVENOUS | Status: DC

## 2023-10-20 MED ORDER — SODIUM CHLORIDE 0.9 % IV SOLN
300.0000 mg | Freq: Once | INTRAVENOUS | Status: AC
  Administered 2023-10-20: 300 mg via INTRAVENOUS
  Filled 2023-10-20: qty 300

## 2023-10-24 NOTE — Progress Notes (Addendum)
 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 Migraine prevention:  Aimovig  every 28 days; continue topiramate  100mg  at bedtime  3.  Migraine Rescue:  Nurtec first line, tramadol  second line; Zofran -ODT for nausea.  For cluster headaches, tramadol . 4.  Limit use of pain relievers to no more than 9 days out of the month to prevent risk of rebound or medication-overuse headache. 5.  Keep headache diary 6.  Follow up in 6 months.  01/08/2024 ADDENDUM:  Patient requesting punch skin biopsy.  Will send in order.  Subjective:  Daquisha Clermont is a 43 year old female with prolactinoma and anxiety with depression who follows up for migraine as well as new concern for neuropathy.   UPDATE: Neuropathy: She had started on Mounjaro  for diabetes.  She lost almost 100 lbs and Hgb A1c has improved.  She underwent left ulnar transposition.  Notes numbness and tingling on the bottom of her feet.  It hurts when she is walking.  Had extensive blood work.  ANA negative, sed rate 2, RF negative, CCP Ab negative, ds-DNA ab negative, anti-sm Ab negative, SSA/SSB Abs negative, aldolase 2.7  She has history of recurrent syncope and autonomic dysfunction.  Seen cardiology. Diagnosed with POTS.  Started on diltiazem  and encouraged to keep hydrating.    She also has been diagnosed with EDS and MCAS.   Migraine: Changed from Emgality  to Aimovig .  MCAS Migraines:  Improved.  Intensity mod-severe, duration within 2 hours with Nurtec, frequency 2 a week, usually triggered by change in barometric pressure.  Cluster headache: Cluster headache:  none    Rescue  protocol:  Nurtec, tramadol , Zofran  8mg  Frequency of abortive: 2 to 3 days a week Rescue protocol:  Zofran , Nurtec (tramadol  second line) Current NSAIDS:  no Current analgesics:  tramadol  Current triptans:  no Current ergot:  Dostinex  (for hyperprolactinemia) Current anti-emetic:  Zofran  8mg  Current muscle relaxants:  tizanidine  4mg  PRN muscle spams Current anti-anxiolytic:  hydroxyzine  Current sleep aide:  quetiapine  50mg  Current Antihypertensive medications:  no Current Antidepressant medications:  Lexapro  10mg  Current Anticonvulsant medications:  topiramate  100mg  at bedtime, gabapentin 300mg  at bedtime Current CGRP inhibitor:  Emgality  every 21 days, Nurtec (rescue) Current Vitamins/Herbal/Supplements:  magnesium  oxide 400mg , riboflavin 400mg  Current Antihistamines/Decongestants:  Benadryl  Other therapy:  no   Caffeine :  1 cup coffee rarely Alcohol:  no Smoker:  no Diet:  60 oz water daily.  Little fast food.  No soda.  Eats chicken and fish.  Not skipping meals.   Exercise:  no Depression/anxiety:  yes Other pain: none Sleep hygiene:  Improved with quetiapine    Prolactin level still elevated, however  Workup is ongoing.  She is undergoing cardiac evaluation as well.   HISTORY: Migraines Onset:  Childhood Location:  Left sided Quality:  throbbing Initial intensity:  severe Aura:  no Prodrome:  Feels ill for 3 days prior Postdrome:  hangover effect for 1 day after Associated symptoms:  Left sided numbness of face, arm and leg with slight weakness of left arm and leg.  Nausea, photophobia, and phonophobia.  She has not had any new worse headache of her life, waking up from sleep Initial Duration:  Several hours to several days.  Over the summer, she had status migrainosis lasting 13-14 weeks. Initial Frequency:  3 to 4 days a week Initial Frequency of abortive medication: 3 to 4 days a week Triggers/aggravating factors:  Sleep deprivation, shifting positions Relieving  factors:  Laying down in dark and quiet room Activity:  Aggravates.   Past NSAIDS:  Ibuprofen.  Cannot take NSAIDs due to GI bleed Past analgesics:  Excedrin Past abortive triptans/ergot:  Sumatriptan  (increased headache and caused joint pain).  CONTRAINDICATED (Hemiplegic migraine) Past muscle relaxants:  no Past anti-emetic:  Reglan  (contraindicated with cabergoline ), Zofran  4mg , Promethazine  Past antihypertensive medications:  Propranolol  80mg  (hypotension), metoprolol  Past antidepressant medications:  Prozac ; venlafaxine  XR 150mg  daily Past anticonvulsant medications: none Past CGRP inhibitors:  Ubrelvy  Past vitamins/Herbal/Supplements:  no Past antihistamines/decongestants:  no Other past therapies:  Reyvow Unable to afford Botox with her insurance plan.    Cluster Headache: Right orbital, stabbing, severe, associated with ptosis and conjunctival injection, lasting several hours and occurring 1 to 2 times a month.   Family history of headache:  mom   Pituitary microadenoma: MRI of brain with and without contrast from 05/25/16 revealed 6 x 9 mm hypoenhancing pituitary microadenoma without compression of the optic chiasm. MRI of pituitary with and without contrast from 08/31/2019 showed regression of the microadenoma since 2018.   PAST MEDICAL HISTORY: Past Medical History:  Diagnosis Date   Anxiety    Asthma    Depression    Diabetes (HCC)    GERD (gastroesophageal reflux disease)    History of chicken pox    History of migraine headaches    Migraines    Pituitary adenoma (HCC)    POTS (postural orthostatic tachycardia syndrome)    Seizures (HCC)     MEDICATIONS: Current Outpatient Medications on File Prior to Visit  Medication Sig Dispense Refill   cabergoline  (DOSTINEX ) 0.5 MG tablet 2.5 mg weekly (Two tabs on Mondays, 1 tab on wednesdays and 2 tabs on Fridays) 65 tablet 3   cetirizine (ZYRTEC) 10 MG chewable tablet Chew 10 mg by mouth in the morning and at bedtime.      Cholecalciferol (TRUE VITAMIN D3) 1.25 MG (50000 UT) capsule Take 1 capsule (50,000 Units total) by mouth daily. 12 capsule 0   Continuous Glucose Sensor (DEXCOM G7 SENSOR) MISC USE 1 SENSOR TOPICALLY EVERY 10 DAYS 1 each 11   cromolyn  (GASTROCROM ) 100 MG/5ML solution Take 5 mLs (100 mg total) by mouth 4 (four) times daily -  before meals and at bedtime. 480 mL 12   cyanocobalamin  (VITAMIN B12) 1000 MCG/ML injection Inject 1 mL (1,000 mcg total) into the muscle every 14 (fourteen) days. 6 mL 4   dexlansoprazole  (DEXILANT ) 60 MG capsule Take 1 capsule (60 mg total) by mouth daily. 90 capsule 3   EPINEPHrine  (EPIPEN  2-PAK) 0.3 mg/0.3 mL IJ SOAJ injection Inject 0.3 mg into the muscle as needed for anaphylaxis. 1 each 2   Erenumab -aooe (AIMOVIG ) 140 MG/ML SOAJ Inject 140 mg as directed subcutaneously once a month 1 mL 7   escitalopram  (LEXAPRO ) 10 MG tablet Take 1 tablet (10 mg total) by mouth daily. 90 tablet 3   famotidine  (PEPCID ) 20 MG tablet Take 20 mg by mouth 2 (two) times daily.     fenofibrate  (TRICOR ) 145 MG tablet Take 1 tablet (145 mg total) by mouth daily. 90 tablet 3   folic acid  (FOLVITE ) 1 MG tablet Take 1 tablet (1 mg total) by mouth daily. 30 tablet 6   glucose blood (ACCU-CHEK GUIDE TEST)  test strip Use to Check Blood Suagr 100 each 2   glucose blood (ACCU-CHEK GUIDE) test strip 1 each by Other route daily in the afternoon. Use as instructed 100 each 3   ipratropium (ATROVENT ) 0.06 % nasal spray Place 2 sprays into both nostrils 4 (four) times daily. 15 mL 12   Menaquinone-7 (VITAMIN K2) 100 MCG CAPS Take 1 Dose by mouth daily.     omalizumab  (XOLAIR ) 150 MG/ML prefilled syringe Inject 300 mg into the skin every 28 (twenty-eight) days. 6 mL 3   ondansetron  (ZOFRAN -ODT) 8 MG disintegrating tablet Take 1 tablet (8 mg total) by mouth every 8 (eight) hours as needed for nausea or vomiting. 60 tablet 1   QUEtiapine  (SEROQUEL ) 300 MG tablet Take 1 tablet (300 mg total) by mouth at  bedtime. 90 tablet 3   Riboflavin 400 MG CAPS Take 400 mg by mouth daily.     Rimegepant Sulfate (NURTEC) 75 MG TBDP Take 1 tablet (75 mg total) by mouth as needed (Daily as needed for a Migraine). Maximum 1 tablet in 24 hours.  Quantity 8. 8 tablet 5   rosuvastatin  (CRESTOR ) 20 MG tablet Take 1 tablet (20 mg total) by mouth daily. 90 tablet 3   Syringe/Needle, Disp, 25G X 1 1 ML MISC 1 Dose by Does not apply route every 14 (fourteen) days. 25 each 0   tirzepatide  (MOUNJARO ) 15 MG/0.5ML Pen Inject 15 mg into the skin once a week. 2 mL 2   topiramate  (TOPAMAX ) 100 MG tablet Take 1 tablet (100 mg total) by mouth at bedtime. 30 tablet 5   traMADol  (ULTRAM ) 50 MG tablet Take 1 tablet (50 mg total) by mouth every 6 (six) hours as needed. 30 tablet 2   No current facility-administered medications on file prior to visit.     ALLERGIES: Allergies  Allergen Reactions   Bee Venom Anaphylaxis   Chlorhexidine Hives, Itching and Rash   Ibuprofen Other (See Comments)    GI bleed   Iodine Hives, Itching and Rash   Peanut-Containing Drug Products Other (See Comments)    Pt reports unknown   Sesame Seed (Diagnostic) Anaphylaxis   Shellfish Allergy  Shortness Of Breath, Swelling and Other (See Comments)    Tingling in the lips, mouth and throat   Soybean-Containing Drug Products Anaphylaxis   Sulfa Antibiotics Anaphylaxis   Triptans Other (See Comments)    Makes pain worse. Pain in joints   Lunesta  [Eszopiclone ] Nausea And Vomiting    GI issues   Almond (Diagnostic) Other (See Comments)    Unknown per pt   Aspirin Other (See Comments)    UNKNOWN REACTION. Her parents and siblings are allergic to Aspirin.   Cashew Nut (Anacardium Occidentale) Skin Test Other (See Comments)    Unknown per pt   Hazelnut (Filbert) Other (See Comments)    Unknown per pt   Nsaids Nausea And Vomiting   Tape Rash    Blisters    Walnut Other (See Comments)    Unknown per pt   Wheat Nausea And Vomiting    FAMILY  HISTORY: Family History  Problem Relation Age of Onset   Asthma Mother    Diabetes Mother    Migraines Mother    Hyperlipidemia Mother    Bipolar disorder Mother    Irritable bowel syndrome Mother    Asthma Father    Diabetes Father    Colon cancer Father        dx at age 68   Hyperlipidemia Father  Hypertension Father    Migraines Sister    Bipolar disorder Brother    Other Neg Hx        pituitary disorder   Esophageal cancer Neg Hx       Objective:  Blood pressure 90/68, pulse 94, height 5' 6 (1.676 m), weight 192 lb (87.1 kg), last menstrual period 10/01/2023, SpO2 98%. General: No acute distress.  Patient appears well-groomed.   Head:  Normocephalic/atraumatic Eyes:  Fundi examined but not visualized Neck: supple, no paraspinal tenderness, full range of motion Heart:  Regular rate and rhythm Neurological Exam: alert and oriented.  Speech fluent and not dysarthric, language intact.  CN II-XII intact. Bulk and tone normal, muscle strength 5/5 throughout.  Sensation to pinprick and vibration intact.  Deep tendon reflexes 2+ throughout, toes downgoing.  Finger to nose testing intact.  Gait normal, Romberg negative.   Juliene Dunnings, DO  CC: Mabel Pry, DO

## 2023-10-25 ENCOUNTER — Ambulatory Visit (INDEPENDENT_AMBULATORY_CARE_PROVIDER_SITE_OTHER): Admitting: Neurology

## 2023-10-25 ENCOUNTER — Other Ambulatory Visit

## 2023-10-25 ENCOUNTER — Encounter: Payer: Self-pay | Admitting: Neurology

## 2023-10-25 VITALS — BP 90/68 | HR 94 | Ht 66.0 in | Wt 192.0 lb

## 2023-10-25 DIAGNOSIS — G90A Postural orthostatic tachycardia syndrome (POTS): Secondary | ICD-10-CM | POA: Diagnosis not present

## 2023-10-25 DIAGNOSIS — D352 Benign neoplasm of pituitary gland: Secondary | ICD-10-CM | POA: Diagnosis not present

## 2023-10-25 DIAGNOSIS — G43409 Hemiplegic migraine, not intractable, without status migrainosus: Secondary | ICD-10-CM

## 2023-10-25 DIAGNOSIS — G629 Polyneuropathy, unspecified: Secondary | ICD-10-CM

## 2023-10-25 DIAGNOSIS — G44019 Episodic cluster headache, not intractable: Secondary | ICD-10-CM

## 2023-10-25 NOTE — Patient Instructions (Signed)
 Continue Aimovig  Nurtec, tramadol , Zofran  for rescue treatment For neuropathy workup: Labs - ACE, IFE NCV-EMG for polyneuropathy Follow up 8 months.

## 2023-10-27 ENCOUNTER — Inpatient Hospital Stay

## 2023-10-30 ENCOUNTER — Ambulatory Visit: Payer: Self-pay | Admitting: Neurology

## 2023-10-31 ENCOUNTER — Ambulatory Visit: Attending: Family Medicine

## 2023-10-31 DIAGNOSIS — Z7409 Other reduced mobility: Secondary | ICD-10-CM | POA: Diagnosis present

## 2023-10-31 DIAGNOSIS — M79642 Pain in left hand: Secondary | ICD-10-CM | POA: Diagnosis present

## 2023-10-31 DIAGNOSIS — M79641 Pain in right hand: Secondary | ICD-10-CM | POA: Insufficient documentation

## 2023-10-31 DIAGNOSIS — M2550 Pain in unspecified joint: Secondary | ICD-10-CM | POA: Diagnosis present

## 2023-10-31 LAB — IMMUNOFIXATION ELECTROPHORESIS
IgG (Immunoglobin G), Serum: 971 mg/dL (ref 600–1640)
IgM, Serum: 140 mg/dL (ref 50–300)
Immunoglobulin A: 250 mg/dL (ref 47–310)

## 2023-10-31 LAB — ANGIOTENSIN CONVERTING ENZYME: Angiotensin-Converting Enzyme: 14 U/L (ref 9–67)

## 2023-10-31 NOTE — Therapy (Signed)
 OUTPATIENT PHYSICAL THERAPY TREATMENT  Patient Name: Amber [name] MRN: [id number] DOB:March 20, 1981, 43 y.o., female Today's Date: 11/01/2023  END OF SESSION:  PT End of Session - 10/31/23 1534     Visit Number 3    Number of Visits 17    Date for PT Re-Evaluation 11/29/23    Authorization Type [facility]    PT Start Time 1530    PT Stop Time 1615    PT Time Calculation (min) 45 min    Activity Tolerance Patient tolerated treatment well    Behavior During Therapy WFL for tasks assessed/performed            Past Medical History:  Diagnosis Date   Anxiety    Asthma    Depression    Diabetes (HCC)    GERD (gastroesophageal reflux disease)    History of chicken pox    History of migraine headaches    Migraines    Pituitary adenoma (HCC)    POTS (postural orthostatic tachycardia syndrome)    Seizures (HCC)    Past Surgical History:  Procedure Laterality Date   APPENDECTOMY     CHOLECYSTECTOMY     ESOPHAGEAL MANOMETRY N/A 07/12/2023   Procedure: MANOMETRY, ESOPHAGUS;  Surgeon: San Sandor GAILS, DO;  Location: [facility];  Service: Gastroenterology;  Laterality: N/A;   PH IMPEDANCE STUDY N/A 07/12/2023   Procedure: IMPEDANCE PH STUDY, ESOPHAGUS;  Surgeon: San Sandor GAILS, DO;  Location: [facility];  Service: Gastroenterology;  Laterality: N/A;   Pituiary Tumor     Tumor Removal Pituitary   Patient Active Problem List   Diagnosis Date Noted   EDS (Ehlers-Danlos syndrome) 09/20/2023   Dysautonomia (HCC) 09/20/2023   Bilateral hand swelling 07/25/2023   Palpitations 03/13/2023   Paroxysmal tachycardia (HCC) 03/13/2023   Mixed hyperlipidemia 02/14/2023   Iron  deficiency anemia 01/13/2023   Type 2 diabetes mellitus with hyperglycemia, without long-term current use of insulin  (HCC) 12/28/2022   Asthma 03/23/2021   Hypertriglyceridemia 11/10/2020   Myofascial pain 05/07/2020   Multiple closed fractures of ribs of right side 05/07/2020   Numbness of toes  05/07/2020   Prediabetes 04/28/2020   Pituitary microadenoma (HCC) 04/28/2020   Prolactinoma (HCC) 04/28/2020   Pre-diabetes 12/25/2019   Infertility counseling 12/24/2019   Well woman exam 12/24/2019   Hyperglycemia 06/05/2019   Insomnia 01/07/2019   Bilateral hand pain 11/09/2018   Agoraphobia 01/18/2018   Anxiety with depression 03/24/2017   Migraine 05/11/2016   Pituitary adenoma (HCC) 03/05/2016   Hyperprolactinemia (HCC) 03/02/2016    PCP: Frann Mabel Mt DO  REFERRING PROVIDER: Joane Birmingham MD   REFERRING DIAG: M25.50 (ICD-10-CM) - Hypermobility arthralgia   Rationale for Evaluation and Treatment: Rehabilitation  THERAPY DIAG:  Hypermobility arthralgia  Decreased functional mobility and endurance  ONSET DATE: chronic 5+   SUBJECTIVE:  SUBJECTIVE STATEMENT: Pt presents to PT with continued pelvis pain and slight R shoulder pain at 3/10. Has been compliant with HEP but has had syncopal and migraine episodes after last few days.   EVAL: Pt reports being worked up for about 5 yrs due to hand issues but does recall subluxing hips as a child.  Primary MD gave her some exercises. She has difficulty with stability and balance during functional activities at home.  Since losing wgt (95 lbs) she has had more joint pain.  Seeing Neuro soon for workup for tingling in feet and hands.  She has difficulty standing and walking (avoids shopping), activity in general.  She does walk the dogs every other day (1/2 mile).  Someday she can't make it the full time.  She has difficulty with lifting, hand function and gripping.  Compression gloves at night. She has cubital tunnel elbow braces but she does not wear them much.  She did OT for only 1 visit but did not return, was recommended to follow up about  the hypermobility in her hands at that time.   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 about 1.5 yrs    Has the patient been formally diagnosed with EDS or HSD? If yes indicate the type EDS-type III recent diagnosis    If no ,does the patient feel they may have EDS or HSD?  Patient reports symptoms and/or instability in the following areas:  - Cervical Spine: [x] Pain [] Instability [] Limited ROM [] Paresthesia  - Thoracic Spine: [] Pain [] Instability  - Lumbar Spine: [x] Pain [] Instability [] Frequent locking/giving out  - Shoulder: [] L [x] R  [x] Subluxation [] Dislocations [x] Pain [] Fatigue  - Elbow: [] L [] R  [] Instability [] Hyperextension [] Pain  - Wrist/Hand: [x] L [x] R  [x] Instability [] Fatigue with use [x] Pain  - Hip: [x] L [x] R  [x] Instability [x] Pain [x] Clicking [] Gait deviations  - Knee: [x] L [x] R  [x] Instability [x] Hyperextension [x] Pain  - Ankle/Foot: [x] L [x] R  [x] Instability [] Frequent sprains Pain , Lt. 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, husband is disabled and she helps him with more executive functioning activities ____________________________   Patient-reported Beighton Score (if known): _____8__/9  Clinician-assessed Beighton Score (today): _____8__/9     PERTINENT HISTORY:  MD note:    MS: bilat hip pain w/ sublux in middle school, bilat hand pain and swelling, bilat shoulder instability R>L Skin/Immune reactions: stretchy skin, chronic urticaria Nervous system: dizziness esp w/ transitioning positions Head/Spine: chronic migraines, insomnia CV: tachycardia when transitioning to stand GI: GERD, chronic diarrhea,  intermittent constipation , Genitourinary: none Hands & Feet: swell, paresthesia bilat hands and feet   PAIN:  Are you having pain?  Yes: NPRS scale: 7/10 Pain location: hips lateral and groin Pain description: sore and achy  Aggravating factors: sitting too long Relieving factors: pretzel, Tylenol , heating pad  Are you having pain?  Yes: NPRS scale: 5/10  Pain location: Rt shoulder  Pain description: moving around a lot  Aggravating factors: overuse , handling dog  Relieving factors: heating pad    PRECAUTIONS: None  RED FLAGS: Monitor syncope   WEIGHT BEARING RESTRICTIONS: No  FALLS:  Has patient fallen in last 6 months? 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  DIAGNOSTIC FINDINGS:  none  CARDIO/ORTHOSTATICS: Baseline RHR 99 Standing HR 125  Baseline BP 90/62 Standing BP 101/53 min dizziness    PATIENT SURVEYS:  Quick Dash:  QUICK DASH  Please rate your ability do the following activities in the last week by selecting the number below the appropriate response.   Activities Rating  Open a tight or new jar.  3 = Moderate difficulty  Do heavy household chores (e.g., wash walls, floors). 3 = Moderate difficulty  Carry a shopping bag or briefcase 4 = Severe difficulty  Wash your back. 2 = Mild difficulty  Use a knife to cut food. 2 = Mild difficulty  Recreational activities in  which you take some force or impact through your arm, shoulder or hand (e.g., golf, hammering, tennis, etc.). 3 = Moderate difficulty  During the past week, to what extent has your arm, shoulder or hand problem interfered with your normal social activities with family, friends, neighbors or groups?  3 = Moderately  During the past week, were you limited in your work or other regular daily activities as a result of your arm, shoulder or hand problem? 3 = Moderately limited  During the past week, were you limited in your work or other regular daily activities as a result of your arm, shoulder or hand problem? 3 = Moderate  Tingling (pins and needles) in your arm, shoulder or hand. 3 = Moderate  During the past week, how much difficulty have you had sleeping because of the pain in your arm, shoulder or hand?  3 = Moderate difficulty   (A QuickDASH score may not be calculated if there is greater than 1 missing item.)  Quick Dash Disability/Symptom Score: [(sum of 32 (n) responses/11 (n)] x 25 = 47% able   Minimally Clinically Important Difference (MCID): 15-20 points  (Franchignoni, F. et al. (2013). Minimally clinically important difference of the disabilities of the arm, shoulder, and hand outcome measures (DASH) and its shortened version (Quick DASH). Journal of Orthopaedic & Sports Physical Therapy, 44(1), 30-39)   COGNITION: Overall cognitive status: Within functional limits for tasks assessed     SENSATION: Feet tingling at frequently , some N/T in L elbow from surgery    POSTURE: rounded shoulders, forward head, increased thoracic kyphosis, and posterior pelvic tilt  PALPATION: Sore to palpation Rt shoulder grossly  Skin is extensible and very smooth soft    LUMBAR ROM:   AROM eval  Flexion Palms flat   Extension   Right lateral flexion   Left lateral flexion   Right rotation   Left rotation    (Blank rows = not tested)  LOWER EXTREMITY ROM:  PROM WNL, hypermobile IR  >ER   LOWER EXTREMITY MMT:    MMT Right eval Left eval  Hip flexion 4+ 4+  Hip extension    Hip abduction    Hip adduction    Hip internal rotation    Hip external rotation    Knee flexion 5 5  Knee  extension 5 5  Ankle dorsiflexion    Ankle plantarflexion    Ankle inversion    Ankle eversion     (Blank rows = not tested)  LUMBAR SPECIAL TESTS:  NT   CERVICAL ROM:  NT on eval  Active ROM A/PROM (deg) 11/01/2023  Flexion   Extension   Right lateral flexion   Left lateral flexion   Right rotation   Left rotation    (Blank rows = not tested)  UE ROM: WNL  Pain limits Rt shoulder combo ER/ and abduction  UE MMT:  MMT Right eval Left eval  Shoulder flexion 4-pain  4  Shoulder extension    Shoulder abduction 4-pain  4  Shoulder adduction    Shoulder extension    Shoulder internal rotation 4 pain  4+  Shoulder external rotation 4 pain  4+  Middle trapezius    Lower trapezius    Elbow flexion 4 4+  Elbow extension 4 4+  Wrist flexion    Wrist extension    Wrist ulnar deviation    Wrist radial deviation    Wrist pronation    Wrist supination    Grip strength     (Blank rows = not tested)  CERVICAL SPECIAL TESTS:  NT   FUNCTIONAL TESTS:  5 times sit to stand: 18.2 sec   30 seconds chair stand test 9 reps  2WMT: 374ft HR -     Beighton Scale Lumbar (_1/1) Knees (_1/2) Elbows (_2/2)  5th digit (_2/2) Thumb (_2/2)  8/9: knee hyper extension  5 deg on Rt and 10 on Lt   TREATMENT: [facility] Adult PT Treatment:                                                DATE: 10/31/2023 NuStep lvl 3 LE only x 4 min for activity tolerance while monitoring HR Supine PPT x 10 - 5 hold Supine PPT with ball 2x10 - 5 hold Pilates SLR 2x10 ea PPT into bridge 2x10 with pilates ring PPT with shoulder pulldown 2x10 GTB Supine R shoulder flexion 2x10 2# S/L R shoulder ER 2x10 2# Seated horizontal abd 2x10 RTB  [facility] Adult PT Treatment:                                                 DATE: 10/12/2023 - 394ft Supine PPT x 10 - 5 hold PPT into bridge 2x10 S/L clamshell 2x10 RTB Seated scapular retraction x 10 - 5 hold R shoulder ER isometric x 10 - 5 hold Seated bilateral ER RTB 3x10  PATIENT EDUCATION:  Education details:  Posture, alignment, neutral zone and joint protection PNE/Explain pain    Joint inflammation/collagen/ligament laxity, muscular support Stretching vs stabilizing  Person educated: Patient Education method: Explanation Education comprehension: verbalized understanding   HOME EXERCISE PROGRAM: Access Code: [email or web address] URL: [email or web address] Date: 10/12/2023 Prepared by: Alm Kingdom  Exercises - Supine Posterior Pelvic Tilt  - 1 x daily - 7 x weekly - 2 sets - 10 reps - 5 sec hold - Supine Bridge  - 1 x daily - 7 x weekly - 2 sets - 10 reps - Clamshell with Resistance  - 1 x daily - 7 x weekly -  2 sets - 15 reps - red band hold - Seated Scapular Retraction  - 1 x daily - 7 x weekly - 2 sets - 10 reps - 3-5 sec hold - Standing Isometric Shoulder External Rotation with Doorway and Towel Roll  - 1 x daily - 7 x weekly - 2 sets - 10 reps - 3-5 sec hold - Shoulder External Rotation and Scapular Retraction with Resistance  - 1 x daily - 7 x weekly - 3 sets - 10 reps - red band hold  ASSESSMENT:  CLINICAL IMPRESSION: Pt was able to complete all prescribed exercises with no adverse effect. Today we progressed hip/core strengthening exercises with emphasis on stability as well as improving R shoulder dynamic stability. Her HR and activity tolerance responded well to exercise today. She continues to benefit from skilled PT services, will continue to progress as able.   OBJECTIVE IMPAIRMENTS: cardiopulmonary status limiting activity, decreased activity tolerance, decreased coordination, decreased endurance, decreased mobility, difficulty walking, decreased ROM, decreased strength, impaired UE functional use,  postural dysfunction, obesity, and pain.   ACTIVITY LIMITATIONS: carrying, lifting, bending, sitting, standing, squatting, sleeping, stairs, transfers, reach over head, locomotion level, and caring for others  PARTICIPATION LIMITATIONS: meal prep, cleaning, laundry, interpersonal relationship, shopping, and community activity  PERSONAL FACTORS: Past/current experiences, Time since onset of injury/illness/exacerbation, and 3+ comorbidities: dysautonomia/syncope, MCAS, L elbow post surgery  are also affecting patient's functional outcome.   REHAB POTENTIAL: Excellent  CLINICAL DECISION MAKING: Evolving/moderate complexity  EVALUATION COMPLEXITY: Moderate   GOALS: Goals reviewed with patient? Yes  SHORT TERM GOALS: Target date: 11/01/2023   Patient will be able to show independence for initial HEP to include posture, core and hip strength and stability.   Baseline: Goal status: IMET  2.    Patient will be able to complete functional testing for endurance, balance and strength. Baseline:  Goal status:MET  3.  Pt will report improved awareness of core and posture when performing ADLs  Baseline:  Goal status: IN PROGRESS   LONG TERM GOALS: Target date: 11/29/2023  Patient will be independent with final HEP upon discharge from PT and report consistent benefit following exercise completion.    Baseline:  Goal status: INITIAL  2.  Pt will improve 2 min walk test distance by 50 feet or more with LRAD and no increase in pain.   Baseline:  Goal status: INITIAL  3.  Patient will be I with concepts of joint protection and stability as it pertains to joint hypermobility. Baseline:  Goal status: INITIAL  4.  Pt will be able to demonstrate good standing balance as evidenced by balance recovery from min to mod dynamic balance challenges.   Baseline:  Goal status: INITIAL  5.  Pt will be able to consistently walk dogs 1/2 mile without undue fatigue and pain < 2/10. Baseline:  Goal  status: INITIAL    PLAN:  PT FREQUENCY: 2x/week  PT DURATION: 8 weeks  PLANNED INTERVENTIONS: [id number]- PT Re-evaluation, 97750- Physical Performance Testing, 97110-Therapeutic exercises, 97530- Therapeutic activity, [id number]- Neuromuscular re-education, 97535- Self Care, [id number]- Manual therapy, 20560 (1-2 muscles), 20561 (3+ muscles)- Dry Needling, Patient/Family education, Balance training, Taping, Cryotherapy, and Moist heat.  PLAN FOR NEXT SESSION: establish HEP and complete 2 min walk, check BP.HR . Focus on stability training and core control    Alm JAYSON Kingdom, PT 11/01/2023, 8:17 AM

## 2023-11-01 ENCOUNTER — Encounter: Payer: Self-pay | Admitting: Family Medicine

## 2023-11-01 ENCOUNTER — Ambulatory Visit (INDEPENDENT_AMBULATORY_CARE_PROVIDER_SITE_OTHER): Admitting: Family Medicine

## 2023-11-01 VITALS — BP 118/72 | HR 100 | Ht 66.0 in

## 2023-11-01 DIAGNOSIS — T50905A Adverse effect of unspecified drugs, medicaments and biological substances, initial encounter: Secondary | ICD-10-CM

## 2023-11-01 DIAGNOSIS — L659 Nonscarring hair loss, unspecified: Secondary | ICD-10-CM

## 2023-11-01 MED ORDER — MINOXIDIL 2.5 MG PO TABS: 2.5000 mg | ORAL_TABLET | Freq: Every day | ORAL | 1 refills | Status: DC

## 2023-11-01 NOTE — Progress Notes (Signed)
 Chief Complaint  Patient presents with   Alopecia    Pt believes it may be from Mounjaro   I noticed it about 3 months into taking the medication but over the past 1-2 months I've noticed it a lot     Amber [name] is a 43 y.o. female here for a skin complaint.  Duration: 8 months; more severe over past 2 mo Location: scalp Pruritic? No Painful? No Drainage? No New soaps/lotions/topicals/detergents? No New hair styles? No Other associated symptoms: no trauma, red scalp; started after moving up on dosage of Mounjaro  Therapies tried thus far: Biotin  Past Medical History:  Diagnosis Date   Anxiety    Asthma    Depression    Diabetes (HCC)    GERD (gastroesophageal reflux disease)    History of chicken pox    History of migraine headaches    Migraines    Pituitary adenoma (HCC)    POTS (postural orthostatic tachycardia syndrome)    Seizures (HCC)     BP 118/72 (BP Location: Left Arm, Patient Position: Sitting, Cuff Size: Normal)   Pulse 100   Ht 5' 6 (1.676 m)   LMP 10/01/2023 (Approximate)   SpO2 97%   BMI 30.99 kg/m  Gen: awake, alert, appearing stated age Lungs: No accessory muscle use Skin: thinning over the scalp. No patches of alopecia. No drainage, erythema, TTP, fluctuance, excoriation Psych: Age appropriate judgment and insight  Hair thinning - Plan: minoxidil  (LONITEN ) 2.5 MG tablet  Adverse effect of drug, initial encounter  Start low dosage of minoxidil  as she does have hx of her BP dropping. Continue high protein intake. Cont w Fe infusion (hair thinning did not improve after infusion). Thyroid  levels WNL.  F/u in 1 mo. The patient voiced understanding and agreement to the plan.  Mabel Mt [place], DO 11/01/23 4:45 PM

## 2023-11-01 NOTE — Patient Instructions (Signed)
 We are putting you on the lowest adult dosage of minoxidil . Keep an eye on your blood pressure.   Stay hydrated.  Let us  know if you need anything.

## 2023-11-02 ENCOUNTER — Ambulatory Visit

## 2023-11-09 ENCOUNTER — Ambulatory Visit

## 2023-11-09 DIAGNOSIS — M2550 Pain in unspecified joint: Secondary | ICD-10-CM | POA: Diagnosis not present

## 2023-11-09 DIAGNOSIS — Z7409 Other reduced mobility: Secondary | ICD-10-CM

## 2023-11-09 DIAGNOSIS — M79641 Pain in right hand: Secondary | ICD-10-CM

## 2023-11-09 DIAGNOSIS — M79642 Pain in left hand: Secondary | ICD-10-CM

## 2023-11-09 NOTE — Therapy (Signed)
 OUTPATIENT PHYSICAL THERAPY TREATMENT  Patient Name: Amber [name] MRN: [id number] DOB:May 27, 1980, 43 y.o., female Today's Date: 11/10/2023  END OF SESSION:  PT End of Session - 11/09/23 1621     Visit Number 4    Number of Visits 17    Date for PT Re-Evaluation 11/29/23    Authorization Type [facility]    PT Start Time 1617    PT Stop Time 1657    PT Time Calculation (min) 40 min    Activity Tolerance Patient tolerated treatment well    Behavior During Therapy WFL for tasks assessed/performed             Past Medical History:  Diagnosis Date   Anxiety    Asthma    Depression    Diabetes (HCC)    GERD (gastroesophageal reflux disease)    History of chicken pox    History of migraine headaches    Migraines    Pituitary adenoma (HCC)    POTS (postural orthostatic tachycardia syndrome)    Seizures (HCC)    Past Surgical History:  Procedure Laterality Date   APPENDECTOMY     CHOLECYSTECTOMY     ESOPHAGEAL MANOMETRY N/A 07/12/2023   Procedure: MANOMETRY, ESOPHAGUS;  Surgeon: San Sandor GAILS, DO;  Location: [facility];  Service: Gastroenterology;  Laterality: N/A;   PH IMPEDANCE STUDY N/A 07/12/2023   Procedure: IMPEDANCE PH STUDY, ESOPHAGUS;  Surgeon: San Sandor GAILS, DO;  Location: [facility];  Service: Gastroenterology;  Laterality: N/A;   Pituiary Tumor     Tumor Removal Pituitary   Patient Active Problem List   Diagnosis Date Noted   EDS (Ehlers-Danlos syndrome) 09/20/2023   Dysautonomia (HCC) 09/20/2023   Bilateral hand swelling 07/25/2023   Palpitations 03/13/2023   Paroxysmal tachycardia (HCC) 03/13/2023   Mixed hyperlipidemia 02/14/2023   Iron  deficiency anemia 01/13/2023   Type 2 diabetes mellitus with hyperglycemia, without long-term current use of insulin  (HCC) 12/28/2022   Asthma 03/23/2021   Hypertriglyceridemia 11/10/2020   Myofascial pain 05/07/2020   Multiple closed fractures of ribs of right side 05/07/2020   Numbness of toes  05/07/2020   Prediabetes 04/28/2020   Pituitary microadenoma (HCC) 04/28/2020   Prolactinoma (HCC) 04/28/2020   Pre-diabetes 12/25/2019   Infertility counseling 12/24/2019   Well woman exam 12/24/2019   Hyperglycemia 06/05/2019   Insomnia 01/07/2019   Bilateral hand pain 11/09/2018   Agoraphobia 01/18/2018   Anxiety with depression 03/24/2017   Migraine 05/11/2016   Pituitary adenoma (HCC) 03/05/2016   Hyperprolactinemia (HCC) 03/02/2016    PCP: Frann Mabel Mt DO  REFERRING PROVIDER: Joane Birmingham MD   REFERRING DIAG: M25.50 (ICD-10-CM) - Hypermobility arthralgia   Rationale for Evaluation and Treatment: Rehabilitation  THERAPY DIAG:  Hypermobility arthralgia  Decreased functional mobility and endurance  Pain in right hand  Pain in left hand  ONSET DATE: chronic 5+   SUBJECTIVE:  SUBJECTIVE STATEMENT: Pt presents to PT with reports of increased R shoulder pain after being pulled by her dog, 6/10. Pelvis and hip pain roughly 5/10 today.   EVAL: Pt reports being worked up for about 5 yrs due to hand issues but does recall subluxing hips as a child.  Primary MD gave her some exercises. She has difficulty with stability and balance during functional activities at home.  Since losing wgt (95 lbs) she has had more joint pain.  Seeing Neuro soon for workup for tingling in feet and hands.  She has difficulty standing and walking (avoids shopping), activity in general.  She does walk the dogs every other day (1/2 mile).  Someday she can't make it the full time.  She has difficulty with lifting, hand function and gripping.  Compression gloves at night. She has cubital tunnel elbow braces but she does not wear them much.  She did OT for only 1 visit but did not return, was recommended to follow up  about the hypermobility in her hands at that time.   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 about 1.5 yrs    Has the patient been formally diagnosed with EDS or HSD? If yes indicate the type EDS-type III recent diagnosis    If no ,does the patient feel they may have EDS or HSD?  Patient reports symptoms and/or instability in the following areas:  - Cervical Spine: [x] Pain [] Instability [] Limited ROM [] Paresthesia  - Thoracic Spine: [] Pain [] Instability  - Lumbar Spine: [x] Pain [] Instability [] Frequent locking/giving out  - Shoulder: [] L [x] R  [x] Subluxation [] Dislocations [x] Pain [] Fatigue  - Elbow: [] L [] R  [] Instability [] Hyperextension [] Pain  - Wrist/Hand: [x] L [x] R  [x] Instability [] Fatigue with use [x] Pain  - Hip: [x] L [x] R  [x] Instability [x] Pain [x] Clicking [] Gait deviations  - Knee: [x] L [x] R  [x] Instability [x] Hyperextension [x] Pain  - Ankle/Foot: [x] L [x] R  [x] Instability [] Frequent sprains Pain , Lt. 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, husband is disabled and she helps him with more executive functioning activities ____________________________   Patient-reported Beighton Score (if known): _____8__/9  Clinician-assessed Beighton Score (today): _____8__/9     PERTINENT HISTORY:  MD note:    MS: bilat hip pain w/ sublux in middle school, bilat hand pain and swelling, bilat shoulder instability R>L Skin/Immune reactions: stretchy skin, chronic urticaria Nervous system: dizziness esp w/ transitioning positions Head/Spine: chronic migraines, insomnia CV: tachycardia when transitioning to stand GI: GERD, chronic diarrhea,  intermittent constipation , Genitourinary: none Hands & Feet: swell, paresthesia bilat hands and feet   PAIN:  Are you having pain?  Yes: NPRS scale: 7/10 Pain location: hips lateral and groin Pain description: sore and achy  Aggravating factors: sitting too long Relieving factors: pretzel, Tylenol , heating pad  Are you having pain?  Yes: NPRS scale: 5/10  Pain location: Rt shoulder  Pain description: moving around a lot  Aggravating factors: overuse , handling dog  Relieving factors: heating pad    PRECAUTIONS: None  RED FLAGS: Monitor syncope   WEIGHT BEARING RESTRICTIONS: No  FALLS:  Has patient fallen in last 6 months? 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  DIAGNOSTIC FINDINGS:  none  CARDIO/ORTHOSTATICS: Baseline RHR 99 Standing HR 125  Baseline BP 90/62 Standing BP 101/53 min dizziness    PATIENT SURVEYS:  Quick Dash:  QUICK DASH  Please rate your ability do the following activities in the last week by selecting the number below the appropriate response.   Activities Rating  Open a tight or new jar.  3 = Moderate difficulty  Do heavy household chores (e.g., wash walls, floors). 3 = Moderate difficulty  Carry a shopping bag or briefcase 4 = Severe difficulty  Wash your back. 2 = Mild difficulty  Use a knife to cut food. 2 = Mild difficulty  Recreational activities in  which you take some force or impact through your arm, shoulder or hand (e.g., golf, hammering, tennis, etc.). 3 = Moderate difficulty  During the past week, to what extent has your arm, shoulder or hand problem interfered with your normal social activities with family, friends, neighbors or groups?  3 = Moderately  During the past week, were you limited in your work or other regular daily activities as a result of your arm, shoulder or hand problem? 3 = Moderately limited  During the past week, were you limited in your work or other regular daily activities as a result of your arm, shoulder or hand problem? 3 = Moderate  Tingling (pins and needles) in your arm, shoulder or hand. 3 = Moderate  During the past week, how much difficulty have you had sleeping because of the pain in your arm, shoulder or hand?  3 = Moderate difficulty   (A QuickDASH score may not be calculated if there is greater than 1 missing item.)  Quick Dash Disability/Symptom Score: [(sum of 32 (n) responses/11 (n)] x 25 = 47% able   Minimally Clinically Important Difference (MCID): 15-20 points  (Franchignoni, F. et al. (2013). Minimally clinically important difference of the disabilities of the arm, shoulder, and hand outcome measures (DASH) and its shortened version (Quick DASH). Journal of Orthopaedic & Sports Physical Therapy, 44(1), 30-39)   COGNITION: Overall cognitive status: Within functional limits for tasks assessed     SENSATION: Feet tingling at frequently , some N/T in L elbow from surgery    POSTURE: rounded shoulders, forward head, increased thoracic kyphosis, and posterior pelvic tilt  PALPATION: Sore to palpation Rt shoulder grossly  Skin is extensible and very smooth soft    LUMBAR ROM:   AROM eval  Flexion Palms flat   Extension   Right lateral flexion   Left lateral flexion   Right rotation   Left rotation    (Blank rows = not tested)  LOWER EXTREMITY ROM:  PROM WNL, hypermobile IR  >ER   LOWER EXTREMITY MMT:    MMT Right eval Left eval  Hip flexion 4+ 4+  Hip extension    Hip abduction    Hip adduction    Hip internal rotation    Hip external rotation    Knee flexion 5 5  Knee extension 5 5  Ankle dorsiflexion  Ankle plantarflexion    Ankle inversion    Ankle eversion     (Blank rows = not tested)  LUMBAR SPECIAL TESTS:  NT   CERVICAL ROM:  NT on eval  Active ROM A/PROM (deg) 11/10/2023  Flexion   Extension   Right lateral flexion   Left lateral flexion   Right rotation   Left rotation    (Blank rows = not tested)  UE ROM: WNL  Pain limits Rt shoulder combo ER/ and abduction  UE MMT:  MMT Right eval Left eval  Shoulder flexion 4-pain  4  Shoulder extension    Shoulder abduction 4-pain  4  Shoulder adduction    Shoulder extension    Shoulder internal rotation 4 pain  4+  Shoulder external rotation 4 pain  4+  Middle trapezius    Lower trapezius    Elbow flexion 4 4+  Elbow extension 4 4+  Wrist flexion    Wrist extension    Wrist ulnar deviation    Wrist radial deviation    Wrist pronation    Wrist supination    Grip strength     (Blank rows = not tested)  CERVICAL SPECIAL TESTS:  NT   FUNCTIONAL TESTS:  5 times sit to stand: 18.2 sec   30 seconds chair stand test 9 reps  2WMT: 376ft HR -     Beighton Scale Lumbar (_1/1) Knees (_1/2) Elbows (_2/2)  5th digit (_2/2) Thumb (_2/2)  8/9: knee hyper extension  5 deg on Rt and 10 on Lt   TREATMENT: [facility] Adult PT Treatment:                                                DATE: 11/09/2023 NuStep lvl 4 UE/LE x 4 min for activity tolerance while monitoring HR  Supine PPT x 10 - 5 hold PPT into bridge 2x10 with pilates ring Pilates SLR 2x10 ea PPT with shoulder pulldown 3x10 BTB Pilates ring horizontal abd 2x10 with elevation to 90 deg ER isometric walkout x 10 YTB ea shoulder Standing row 2x12 blue band  [facility] Adult PT Treatment:                                                 DATE: 10/31/2023 NuStep lvl 3 LE only x 4 min for activity tolerance while monitoring HR Supine PPT x 10 - 5 hold Supine PPT with ball 2x10 - 5 hold Pilates SLR 2x10 ea PPT into bridge 2x10 with pilates ring PPT with shoulder pulldown 2x10 GTB Supine R shoulder flexion 2x10 2# S/L R shoulder ER 2x10 2# Seated horizontal abd 2x10 RTB  [facility] Adult PT Treatment:                                                DATE: 10/12/2023 - 360ft Supine PPT x 10 - 5 hold PPT into bridge 2x10 S/L clamshell 2x10 RTB Seated scapular retraction x 10 - 5 hold R shoulder ER isometric x 10 - 5 hold Seated bilateral ER RTB 3x10  PATIENT EDUCATION:  Education details:  Posture, alignment, neutral zone and joint protection PNE/Explain pain    Joint inflammation/collagen/ligament laxity, muscular support Stretching vs stabilizing  Person educated: Patient Education method: Explanation Education comprehension: verbalized understanding   HOME EXERCISE PROGRAM: Access Code: [email or web address] URL: [email or web address] Date: 10/12/2023 Prepared by: Alm Kingdom  Exercises - Supine Posterior Pelvic Tilt  - 1 x daily - 7 x weekly - 2 sets - 10 reps - 5 sec hold - Supine Bridge  - 1 x daily - 7 x weekly - 2 sets - 10 reps - Clamshell with Resistance  - 1 x daily - 7 x weekly - 2 sets - 15 reps - red band hold - Seated Scapular Retraction  - 1 x daily - 7 x weekly - 2 sets - 10 reps - 3-5 sec hold - Standing Isometric Shoulder External Rotation with Doorway and Towel Roll  - 1 x daily - 7 x weekly - 2 sets - 10 reps - 3-5 sec hold - Shoulder External Rotation and Scapular Retraction with Resistance  - 1 x daily - 7 x weekly - 3 sets - 10 reps - red band hold  ASSESSMENT:  CLINICAL IMPRESSION: Pt was able to complete all prescribed exercises with no adverse effect. Today we continued to progressed hip/core strengthening exercises with emphasis on stability as well as improving  R shoulder dynamic stability. Her HR remained steady but did have tachycardia with sit>stand activity. She continues to benefit from skilled PT services, will continue to progress as able.   OBJECTIVE IMPAIRMENTS: cardiopulmonary status limiting activity, decreased activity tolerance, decreased coordination, decreased endurance, decreased mobility, difficulty walking, decreased ROM, decreased strength, impaired UE functional use, postural dysfunction, obesity, and pain.   ACTIVITY LIMITATIONS: carrying, lifting, bending, sitting, standing, squatting, sleeping, stairs, transfers, reach over head, locomotion level, and caring for others  PARTICIPATION LIMITATIONS: meal prep, cleaning, laundry, interpersonal relationship, shopping, and community activity  PERSONAL FACTORS: Past/current experiences, Time since onset of injury/illness/exacerbation, and 3+ comorbidities: dysautonomia/syncope, MCAS, L elbow post surgery  are also affecting patient's functional outcome.   REHAB POTENTIAL: Excellent  CLINICAL DECISION MAKING: Evolving/moderate complexity  EVALUATION COMPLEXITY: Moderate   GOALS: Goals reviewed with patient? Yes  SHORT TERM GOALS: Target date: 11/01/2023   Patient will be able to show independence for initial HEP to include posture, core and hip strength and stability.   Baseline: Goal status: IMET  2.    Patient will be able to complete functional testing for endurance, balance and strength. Baseline:  Goal status:MET  3.  Pt will report improved awareness of core and posture when performing ADLs  Baseline:  Goal status: IN PROGRESS   LONG TERM GOALS: Target date: 11/29/2023  Patient will be independent with final HEP upon discharge from PT and report consistent benefit following exercise completion.    Baseline:  Goal status: INITIAL  2.  Pt will improve 2 min walk test distance by 50 feet or more with LRAD and no increase in pain.   Baseline:  Goal status:  INITIAL  3.  Patient will be I with concepts of joint protection and stability as it pertains to joint hypermobility. Baseline:  Goal status: INITIAL  4.  Pt will be able to demonstrate good standing balance as evidenced by balance recovery from min to mod dynamic balance challenges.   Baseline:  Goal status: INITIAL  5.  Pt will be able to consistently walk dogs 1/2 mile without undue fatigue and pain < 2/10. Baseline:  Goal status: INITIAL  PLAN:  PT FREQUENCY: 2x/week  PT DURATION: 8 weeks  PLANNED INTERVENTIONS: [id number]- PT Re-evaluation, 97750- Physical Performance Testing, 97110-Therapeutic exercises, 97530- Therapeutic activity, [id number]- Neuromuscular re-education, 97535- Self Care, [id number]- Manual therapy, 20560 (1-2 muscles), 20561 (3+ muscles)- Dry Needling, Patient/Family education, Balance training, Taping, Cryotherapy, and Moist heat.  PLAN FOR NEXT SESSION: establish HEP and complete 2 min walk, check BP.HR . Focus on stability training and core control    Alm JAYSON Kingdom, PT 11/10/2023, 8:18 AM

## 2023-11-13 ENCOUNTER — Encounter (HOSPITAL_BASED_OUTPATIENT_CLINIC_OR_DEPARTMENT_OTHER): Payer: Self-pay | Admitting: Emergency Medicine

## 2023-11-13 ENCOUNTER — Other Ambulatory Visit: Payer: Self-pay

## 2023-11-13 ENCOUNTER — Emergency Department (HOSPITAL_BASED_OUTPATIENT_CLINIC_OR_DEPARTMENT_OTHER)

## 2023-11-13 ENCOUNTER — Emergency Department (HOSPITAL_BASED_OUTPATIENT_CLINIC_OR_DEPARTMENT_OTHER): Admission: EM | Admit: 2023-11-13 | Discharge: 2023-11-13 | Disposition: A | Discharge status: Home / Self Care

## 2023-11-13 DIAGNOSIS — R0789 Other chest pain: Secondary | ICD-10-CM | POA: Diagnosis not present

## 2023-11-13 DIAGNOSIS — R519 Headache, unspecified: Secondary | ICD-10-CM | POA: Diagnosis present

## 2023-11-13 DIAGNOSIS — G43809 Other migraine, not intractable, without status migrainosus: Secondary | ICD-10-CM | POA: Insufficient documentation

## 2023-11-13 DIAGNOSIS — Z91010 Allergy to peanuts: Secondary | ICD-10-CM | POA: Insufficient documentation

## 2023-11-13 LAB — CBC
HCT: 39.5 % (ref 36.0–46.0)
Hemoglobin: 13.3 g/dL (ref 12.0–15.0)
MCH: 29.6 pg (ref 26.0–34.0)
MCHC: 33.7 g/dL (ref 30.0–36.0)
MCV: 88 fL (ref 80.0–100.0)
Platelets: 367 K/uL (ref 150–400)
RBC: 4.49 MIL/uL (ref 3.87–5.11)
RDW: 13.7 % (ref 11.5–15.5)
WBC: 6.1 K/uL (ref 4.0–10.5)
nRBC: 0 % (ref 0.0–0.2)

## 2023-11-13 LAB — BASIC METABOLIC PANEL WITH GFR
Anion gap: 11 (ref 5–15)
BUN: 11 mg/dL (ref 6–20)
CO2: 20 mmol/L — ABNORMAL LOW (ref 22–32)
Calcium: 9.3 mg/dL (ref 8.9–10.3)
Chloride: 109 mmol/L (ref 98–111)
Creatinine, Ser: 1.02 mg/dL — ABNORMAL HIGH (ref 0.44–1.00)
GFR, Estimated: >60 mL/min (ref >60)
Glucose, Bld: 80 mg/dL (ref 70–99)
Potassium: 3.7 mmol/L (ref 3.5–5.1)
Sodium: 140 mmol/L (ref 135–145)

## 2023-11-13 LAB — TROPONIN T, HIGH SENSITIVITY: Troponin T High Sensitivity: <15 ng/L (ref 0–19)

## 2023-11-13 MED ORDER — SODIUM CHLORIDE 0.9 % IV BOLUS
1000.0000 mL | Freq: Once | INTRAVENOUS | Status: AC
  Administered 2023-11-13: 1000 mL via INTRAVENOUS

## 2023-11-13 MED ORDER — HYDROMORPHONE HCL 1 MG/ML IJ SOLN
1.0000 mg | Freq: Once | INTRAMUSCULAR | Status: AC
  Administered 2023-11-13: 1 mg via INTRAVENOUS
  Filled 2023-11-13: qty 1

## 2023-11-13 MED ORDER — METOCLOPRAMIDE HCL 5 MG/ML IJ SOLN
10.0000 mg | Freq: Once | INTRAMUSCULAR | Status: AC
  Administered 2023-11-13: 10 mg via INTRAVENOUS
  Filled 2023-11-13: qty 2

## 2023-11-13 MED ORDER — DEXAMETHASONE SODIUM PHOSPHATE 10 MG/ML IJ SOLN
10.0000 mg | Freq: Once | INTRAMUSCULAR | Status: AC
  Administered 2023-11-13: 10 mg via INTRAVENOUS
  Filled 2023-11-13: qty 1

## 2023-11-13 MED ORDER — DIPHENHYDRAMINE HCL 50 MG/ML IJ SOLN
25.0000 mg | Freq: Once | INTRAMUSCULAR | Status: AC
  Administered 2023-11-13: 25 mg via INTRAVENOUS
  Filled 2023-11-13: qty 1

## 2023-11-13 NOTE — ED Triage Notes (Signed)
 Pt c/o migraine since appx 0200 today, chest pain since 1200 today.  + nausea.    Took nurtec at 0400, tramadol  at 1200.

## 2023-11-13 NOTE — ED Provider Notes (Signed)
 [facility] Provider Note   CSN: [id number] Arrival date & time: 11/13/23  1839     Patient presents with: Migraine and Chest Pain   Amber [name] is a 43 y.o. female.   43 year old female presents for evaluation of migraine headache.  States she has had it since yesterday and her migraine rescue medications of tramadol  have not helped.  States she is feeling very nauseous and having some chest tightness as well.  Denies any other symptoms or concerns.   Migraine Associated symptoms include chest pain and headaches. Pertinent negatives include no abdominal pain and no shortness of breath.  Chest Pain Associated symptoms: headache and nausea   Associated symptoms: no abdominal pain, no back pain, no cough, no fever, no palpitations, no shortness of breath and no vomiting        Prior to Admission medications   Medication Sig Start Date End Date Taking? Authorizing Provider  cabergoline  (DOSTINEX ) 0.5 MG tablet 2.5 mg weekly (Two tabs on Mondays, 1 tab on wednesdays and 2 tabs on Fridays) 01/09/23   Shamleffer, Ibtehal Jaralla, MD  cetirizine (ZYRTEC) 10 MG chewable tablet Chew 10 mg by mouth in the morning and at bedtime.    [provider]  Cholecalciferol (TRUE VITAMIN D3) 1.25 MG (50000 UT) capsule Take 1 capsule (50,000 Units total) by mouth daily. 10/11/23   Frann Mabel Mt, DO  Continuous Glucose Sensor (DEXCOM G7 SENSOR) MISC USE 1 SENSOR TOPICALLY EVERY 10 DAYS 07/27/23   Frann Mabel Mt, DO  cromolyn  (GASTROCROM ) 100 MG/5ML solution Take 5 mLs (100 mg total) by mouth 4 (four) times daily -  before meals and at bedtime. 10/10/23   Lorin Norris, MD  cyanocobalamin  (VITAMIN B12) 1000 MCG/ML injection Inject 1 mL (1,000 mcg total) into the muscle every 14 (fourteen) days. 08/31/23   Tonette Lauraine HERO, PA-C  dexlansoprazole  (DEXILANT ) 60 MG capsule Take 1 capsule (60 mg total) by mouth daily. 07/24/23    Cirigliano, Vito V, DO  EPINEPHrine  (EPIPEN  2-PAK) 0.3 mg/0.3 mL IJ SOAJ injection Inject 0.3 mg into the muscle as needed for anaphylaxis. 06/14/23   Frann Mabel Mt, DO  Erenumab -aooe (AIMOVIG ) 140 MG/ML SOAJ Inject 140 mg as directed subcutaneously once a month 07/04/23   Skeet Juliene SAUNDERS, DO  escitalopram  (LEXAPRO ) 10 MG tablet Take 1 tablet (10 mg total) by mouth daily. 07/27/23   Frann Mabel Mt, DO  famotidine  (PEPCID ) 20 MG tablet Take 20 mg by mouth 2 (two) times daily.    [provider]  fenofibrate  (TRICOR ) 145 MG tablet Take 1 tablet (145 mg total) by mouth daily. 05/22/23   Frann Mabel Mt, DO  folic acid  (FOLVITE ) 1 MG tablet Take 1 tablet (1 mg total) by mouth daily. 05/23/23   Tonette Lauraine HERO, PA-C  glucose blood (ACCU-CHEK GUIDE TEST) test strip Use to Check Blood [place] 09/06/23   Frann Mabel Mt, DO  glucose blood (ACCU-CHEK GUIDE) test strip 1 each by Other route daily in the afternoon. Use as instructed 01/06/23   Shamleffer, Ibtehal Jaralla, MD  ipratropium (ATROVENT ) 0.06 % nasal spray Place 2 sprays into both nostrils 4 (four) times daily. 09/29/23   Lorin Norris, MD  Menaquinone-7 (VITAMIN K2) 100 MCG CAPS Take 1 Dose by mouth daily.    [provider]  minoxidil  (LONITEN ) 2.5 MG tablet Take 1 tablet (2.5 mg total) by mouth daily. 11/01/23   Frann Mabel Mt, DO  omalizumab  (XOLAIR ) 150 MG/ML prefilled syringe  Inject 300 mg into the skin every 28 (twenty-eight) days. 06/22/23   Lorin Norris, MD  ondansetron  (ZOFRAN -ODT) 8 MG disintegrating tablet Take 1 tablet (8 mg total) by mouth every 8 (eight) hours as needed for nausea or vomiting. 09/06/23   Skeet, Adam R, DO  QUEtiapine  (SEROQUEL ) 300 MG tablet Take 1 tablet (300 mg total) by mouth at bedtime. 05/22/23   Frann Mabel Mt, DO  Riboflavin 400 MG CAPS Take 400 mg by mouth daily.    [provider]  Rimegepant Sulfate (NURTEC) 75 MG TBDP Take 1 tablet (75 mg  total) by mouth as needed (Daily as needed for a Migraine). Maximum 1 tablet in 24 hours.  Quantity 8. 09/06/23   Skeet Juliene SAUNDERS, DO  rosuvastatin  (CRESTOR ) 20 MG tablet Take 1 tablet (20 mg total) by mouth daily. 07/27/23   Frann Mabel Mt, DO  Syringe/Needle, Disp, 25G X 1 1 ML MISC 1 Dose by Does not apply route every 14 (fourteen) days. 08/31/23   Tonette Lauraine HERO, PA-C  tirzepatide  (MOUNJARO ) 15 MG/0.5ML Pen Inject 15 mg into the skin once a week. 09/06/23   Frann Mabel Mt, DO  topiramate  (TOPAMAX ) 100 MG tablet Take 1 tablet (100 mg total) by mouth at bedtime. 09/06/23   Skeet Juliene SAUNDERS, DO  traMADol  (ULTRAM ) 50 MG tablet Take 1 tablet (50 mg total) by mouth every 6 (six) hours as needed. 09/15/23   Skeet Juliene SAUNDERS, DO    Allergies: Bee venom, Chlorhexidine, Ibuprofen, Iodine, Peanut-containing drug products, Sesame seed (diagnostic), Shellfish allergy , Soybean-containing drug products, Sulfa antibiotics, Triptans, Lunesta  [eszopiclone ], Almond (diagnostic), Aspirin, Cashew nut (anacardium occidentale) skin test, Hazelnut (filbert), Nsaids, Tape, Walnut, and Wheat    Review of Systems  Constitutional:  Negative for chills and fever.  HENT:  Negative for ear pain and sore throat.   Eyes:  Negative for pain and visual disturbance.  Respiratory:  Negative for cough and shortness of breath.   Cardiovascular:  Positive for chest pain. Negative for palpitations.  Gastrointestinal:  Positive for nausea. Negative for abdominal pain and vomiting.  Genitourinary:  Negative for dysuria and hematuria.  Musculoskeletal:  Negative for arthralgias and back pain.  Skin:  Negative for color change and rash.  Neurological:  Positive for headaches. Negative for seizures and syncope.  All other systems reviewed and are negative.   Updated Vital Signs BP (!) 93/58 (BP Location: Left Arm)   Pulse 70   Temp 97.9 F (36.6 C) (Oral)   Resp 16   Ht 5' 6 (1.676 m)   Wt 84.8 kg   LMP 11/01/2023  (Approximate)   SpO2 100%   BMI 30.18 kg/m   Physical Exam Vitals and nursing note reviewed.  Constitutional:      General: She is not in acute distress.    Appearance: She is well-developed. She is not ill-appearing.  HENT:     Head: Normocephalic and atraumatic.  Eyes:     Conjunctiva/sclera: Conjunctivae normal.  Cardiovascular:     Rate and Rhythm: Normal rate and regular rhythm.     Heart sounds: No murmur heard. Pulmonary:     Effort: Pulmonary effort is normal. No respiratory distress.     Breath sounds: Normal breath sounds.  Abdominal:     Palpations: Abdomen is soft.     Tenderness: There is no abdominal tenderness.  Musculoskeletal:        General: No swelling.     Cervical back: Neck supple.  Skin:  General: Skin is warm and dry.     Capillary Refill: Capillary refill takes less than 2 seconds.  Neurological:     Mental Status: She is alert.  Psychiatric:        Mood and Affect: Mood normal.     (all labs ordered are listed, but only abnormal results are displayed) Labs Reviewed  BASIC METABOLIC PANEL WITH GFR - Abnormal; Notable for the following components:      Result Value   CO2 20 (*)    Creatinine, Ser 1.02 (*)    All other components within normal limits  CBC  PREGNANCY, URINE  TROPONIN T, HIGH SENSITIVITY  TROPONIN T, HIGH SENSITIVITY    EKG: EKG Interpretation Date/Time:  Monday November 13 2023 18:50:49 EDT Ventricular Rate:  113 PR Interval:  165 QRS Duration:  63 QT Interval:  341 QTC Calculation: 468 R Axis:   84  Text Interpretation: Sinus tachycardia Borderline repolarization abnormality Baseline wander in lead(s) V1 Compared with EKG from 09/04/2023 Confirmed by Gennaro Bouchard ([id number]) on 11/13/2023 6:57:36 PM  Radiology: [facility] Chest 2 View Result Date: 11/13/2023 CLINICAL DATA: Chest pain EXAM: CHEST - 2 VIEW COMPARISON:  Chest radiograph September 04, 2023 FINDINGS: The heart size and mediastinal contours are within normal limits.  Both lungs are clear. The visualized skeletal structures are unremarkable. IMPRESSION: No active cardiopulmonary disease. Electronically Signed   By: Haizel Gatchell  Zare M.D.   On: 11/13/2023 19:49     Procedures   Medications Ordered in the ED  sodium chloride  0.9 % bolus 1,000 mL (0 mLs Intravenous Stopped 11/13/23 2136)  dexamethasone  (DECADRON ) injection 10 mg (10 mg Intravenous Given 11/13/23 2001)  diphenhydrAMINE  (BENADRYL ) injection 25 mg (25 mg Intravenous Given 11/13/23 2003)  metoCLOPramide  (REGLAN ) injection 10 mg (10 mg Intravenous Given 11/13/23 2000)  HYDROmorphone  (DILAUDID ) injection 1 mg (1 mg Intravenous Given 11/13/23 1959)                                    Medical Decision Making Cardiac monitor interpretation: Sinus rhythm, no ectopy  Patient here for migraine headache and atypical chest pain.  Lab workup is negative.  Was given fluids Decadron  and Reglan .  She is allergic to Toradol and states only Dilaudid  works for her pain.  I gave her 1 dose in she is feeling much improved.  Advised postop with her specialist and otherwise return to the ER for new or worsening symptoms.  Feels comfortable being discharged home.  Problems Addressed: Atypical chest pain: acute illness or injury Other migraine without status migrainosus, not intractable: acute illness or injury  Amount and/or Complexity of Data Reviewed External Data Reviewed: notes.    Details: Prior ED records reviewed and patient usually gets a migraine cocktails with Decadron  and Dilaudid  Labs: ordered. Decision-making details documented in ED Course.    Details: Ordered and reviewed by me and unremarkable Radiology: ordered and independent interpretation performed. Decision-making details documented in ED Course.    Details: Ordered and interpreted by me independently of radiology Chest x-ray: Shows no acute abnormality ECG/medicine tests: ordered and independent interpretation performed. Decision-making details  documented in ED Course.    Details: Ordered and interpreted by me in the absence of cardiology and shows sinus rhythm, no STEMI and no acute change when compared to prior EKG  Risk OTC drugs. Prescription drug management. Parenteral controlled substances. Drug therapy requiring intensive monitoring for toxicity.  Final diagnoses:  Other migraine without status migrainosus, not intractable  Atypical chest pain    ED Discharge Orders     None          Gennaro Duwaine CROME, DO 11/13/23 2159

## 2023-11-13 NOTE — Discharge Instructions (Signed)
 Stay on your medications as prescribed.  Follow-up with your primary care doctor and specialist.  Return to the ER for any new or worsening symptoms.

## 2023-11-14 ENCOUNTER — Ambulatory Visit

## 2023-11-14 DIAGNOSIS — M79641 Pain in right hand: Secondary | ICD-10-CM

## 2023-11-14 DIAGNOSIS — M2550 Pain in unspecified joint: Secondary | ICD-10-CM | POA: Diagnosis not present

## 2023-11-14 DIAGNOSIS — Z7409 Other reduced mobility: Secondary | ICD-10-CM

## 2023-11-14 NOTE — Therapy (Signed)
 OUTPATIENT PHYSICAL THERAPY TREATMENT  Patient Name: Amber [name] MRN: [id number] DOB:[birth year], 43 y.o., female Today's Date: 11/14/2023  END OF SESSION:  PT End of Session - 11/14/23 1401     Visit Number 5    Number of Visits 17    Date for PT Re-Evaluation 11/29/23    Authorization Type [facility]    PT Start Time 1400    PT Stop Time 1440    PT Time Calculation (min) 40 min    Activity Tolerance Patient tolerated treatment well    Behavior During Therapy WFL for tasks assessed/performed              Past Medical History:  Diagnosis Date   Anxiety    Asthma    Depression    Diabetes (HCC)    GERD (gastroesophageal reflux disease)    History of chicken pox    History of migraine headaches    Migraines    Pituitary adenoma (HCC)    POTS (postural orthostatic tachycardia syndrome)    Seizures (HCC)    Past Surgical History:  Procedure Laterality Date   APPENDECTOMY     CHOLECYSTECTOMY     ESOPHAGEAL MANOMETRY N/A 07/12/2023   Procedure: MANOMETRY, ESOPHAGUS;  Surgeon: San Sandor GAILS, DO;  Location: [facility];  Service: Gastroenterology;  Laterality: N/A;   PH IMPEDANCE STUDY N/A 07/12/2023   Procedure: IMPEDANCE PH STUDY, ESOPHAGUS;  Surgeon: San Sandor GAILS, DO;  Location: [facility];  Service: Gastroenterology;  Laterality: N/A;   Pituiary Tumor     Tumor Removal Pituitary   Patient Active Problem List   Diagnosis Date Noted   EDS (Ehlers-Danlos syndrome) 09/20/2023   Dysautonomia (HCC) 09/20/2023   Bilateral hand swelling 07/25/2023   Palpitations 03/13/2023   Paroxysmal tachycardia (HCC) 03/13/2023   Mixed hyperlipidemia 02/14/2023   Iron  deficiency anemia 01/13/2023   Type 2 diabetes mellitus with hyperglycemia, without long-term current use of insulin  (HCC) 12/28/2022   Asthma 03/23/2021   Hypertriglyceridemia 11/10/2020   Myofascial pain 05/07/2020   Multiple closed fractures of ribs of right side 05/07/2020   Numbness of toes  05/07/2020   Prediabetes 04/28/2020   Pituitary microadenoma (HCC) 04/28/2020   Prolactinoma (HCC) 04/28/2020   Pre-diabetes 12/25/2019   Infertility counseling 12/24/2019   Well woman exam 12/24/2019   Hyperglycemia 06/05/2019   Insomnia 01/07/2019   Bilateral hand pain 11/09/2018   Agoraphobia 01/18/2018   Anxiety with depression 03/24/2017   Migraine 05/11/2016   Pituitary adenoma (HCC) 03/05/2016   Hyperprolactinemia (HCC) 03/02/2016    PCP: Frann Mabel Mt DO  REFERRING PROVIDER: Joane Birmingham MD   REFERRING DIAG: M25.50 (ICD-10-CM) - Hypermobility arthralgia   Rationale for Evaluation and Treatment: Rehabilitation  THERAPY DIAG:  Hypermobility arthralgia  Decreased functional mobility and endurance  Pain in right hand  ONSET DATE: chronic 5+   SUBJECTIVE:  SUBJECTIVE STATEMENT: Pt presents to PT with reports of R shoulder, lower back, and bilateral hip pain. 5/10 R shoulder and hip, 6/10 for lower back.   EVAL: Pt reports being worked up for about 5 yrs due to hand issues but does recall subluxing hips as a child.  Primary MD gave her some exercises. She has difficulty with stability and balance during functional activities at home.  Since losing wgt (95 lbs) she has had more joint pain.  Seeing Neuro soon for workup for tingling in feet and hands.  She has difficulty standing and walking (avoids shopping), activity in general.  She does walk the dogs every other day (1/2 mile).  Someday she can't make it the full time.  She has difficulty with lifting, hand function and gripping.  Compression gloves at night. She has cubital tunnel elbow braces but she does not wear them much.  She did OT for only 1 visit but did not return, was recommended to follow up about the hypermobility in her  hands at that time.   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 about 1.5 yrs    Has the patient been formally diagnosed with EDS or HSD? If yes indicate the type EDS-type III recent diagnosis    If no ,does the patient feel they may have EDS or HSD?  Patient reports symptoms and/or instability in the following areas:  - Cervical Spine: [x] Pain [] Instability [] Limited ROM [] Paresthesia  - Thoracic Spine: [] Pain [] Instability  - Lumbar Spine: [x] Pain [] Instability [] Frequent locking/giving out  - Shoulder: [] L [x] R  [x] Subluxation [] Dislocations [x] Pain [] Fatigue  - Elbow: [] L [] R  [] Instability [] Hyperextension [] Pain  - Wrist/Hand: [x] L [x] R  [x] Instability [] Fatigue with use [x] Pain  - Hip: [x] L [x] R  [x] Instability [x] Pain [x] Clicking [] Gait deviations  - Knee: [x] L [x] R  [x] Instability [x] Hyperextension [x] Pain  - Ankle/Foot: [x] L [x] R  [x] Instability [] Frequent sprains Pain , Lt. 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, husband is disabled and she helps him with more executive functioning activities ____________________________   Patient-reported Beighton Score (if known): _____8__/9  Clinician-assessed Beighton Score (today): _____8__/9     PERTINENT HISTORY:  MD note:    MS: bilat hip pain w/ sublux in middle school, bilat hand pain and swelling, bilat shoulder instability R>L Skin/Immune reactions: stretchy skin, chronic urticaria Nervous system: dizziness esp w/ transitioning positions Head/Spine: chronic migraines, insomnia CV: tachycardia when transitioning to stand GI: GERD, chronic diarrhea, intermittent constipation  , Genitourinary: none Hands & Feet: swell, paresthesia bilat hands and feet   PAIN:  Are you having pain?  Yes: NPRS scale: 7/10 Pain location: hips lateral and groin Pain description: sore and achy  Aggravating factors: sitting too long Relieving factors: pretzel, Tylenol , heating pad  Are you having pain?  Yes: NPRS scale: 5/10  Pain location: Rt shoulder  Pain description: moving around a lot  Aggravating factors: overuse , handling dog  Relieving factors: heating pad    PRECAUTIONS: None  RED FLAGS: Monitor syncope   WEIGHT BEARING RESTRICTIONS: No  FALLS:  Has patient fallen in last 6 months? 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  DIAGNOSTIC FINDINGS:  none  CARDIO/ORTHOSTATICS: Baseline RHR 99 Standing HR 125  Baseline BP 90/62 Standing BP 101/53 min dizziness    PATIENT SURVEYS:  Quick Dash:  QUICK DASH  Please rate your ability do the following activities in the last week by selecting the number below the appropriate response.   Activities Rating  Open a tight or new jar.  3 = Moderate difficulty  Do heavy household chores (e.g., wash walls, floors). 3 = Moderate difficulty  Carry a shopping bag or briefcase 4 = Severe difficulty  Wash your back. 2 = Mild difficulty  Use a knife to cut food. 2 = Mild difficulty  Recreational activities in which you take some force or  impact through your arm, shoulder or hand (e.g., golf, hammering, tennis, etc.). 3 = Moderate difficulty  During the past week, to what extent has your arm, shoulder or hand problem interfered with your normal social activities with family, friends, neighbors or groups?  3 = Moderately  During the past week, were you limited in your work or other regular daily activities as a result of your arm, shoulder or hand problem? 3 = Moderately limited  During the past week, were you limited in your work or other regular daily activities as a result of your arm, shoulder or hand problem? 3 = Moderate  Tingling (pins and needles) in your arm, shoulder or hand. 3 = Moderate  During the past week, how much difficulty have you had sleeping because of the pain in your arm, shoulder or hand?  3 = Moderate difficulty   (A QuickDASH score may not be calculated if there is greater than 1 missing item.)  Quick Dash Disability/Symptom Score: [(sum of 32 (n) responses/11 (n)] x 25 = 47% able   Minimally Clinically Important Difference (MCID): 15-20 points  (Franchignoni, F. et al. (2013). Minimally clinically important difference of the disabilities of the arm, shoulder, and hand outcome measures (DASH) and its shortened version (Quick DASH). Journal of Orthopaedic & Sports Physical Therapy, 44(1), 30-39)   COGNITION: Overall cognitive status: Within functional limits for tasks assessed     SENSATION: Feet tingling at frequently , some N/T in L elbow from surgery    POSTURE: rounded shoulders, forward head, increased thoracic kyphosis, and posterior pelvic tilt  PALPATION: Sore to palpation Rt shoulder grossly  Skin is extensible and very smooth soft    LUMBAR ROM:   AROM eval  Flexion Palms flat   Extension   Right lateral flexion   Left lateral flexion   Right rotation   Left rotation    (Blank rows = not tested)  LOWER EXTREMITY ROM:  PROM WNL, hypermobile IR >ER   LOWER EXTREMITY MMT:     MMT Right eval Left eval  Hip flexion 4+ 4+  Hip extension    Hip abduction    Hip adduction    Hip internal rotation    Hip external rotation    Knee flexion 5 5  Knee extension 5 5  Ankle dorsiflexion  Ankle plantarflexion    Ankle inversion    Ankle eversion     (Blank rows = not tested)  LUMBAR SPECIAL TESTS:  NT   CERVICAL ROM:  NT on eval  Active ROM A/PROM (deg) 11/14/2023  Flexion   Extension   Right lateral flexion   Left lateral flexion   Right rotation   Left rotation    (Blank rows = not tested)  UE ROM: WNL  Pain limits Rt shoulder combo ER/ and abduction  UE MMT:  MMT Right eval Left eval  Shoulder flexion 4-pain  4  Shoulder extension    Shoulder abduction 4-pain  4  Shoulder adduction    Shoulder extension    Shoulder internal rotation 4 pain  4+  Shoulder external rotation 4 pain  4+  Middle trapezius    Lower trapezius    Elbow flexion 4 4+  Elbow extension 4 4+  Wrist flexion    Wrist extension    Wrist ulnar deviation    Wrist radial deviation    Wrist pronation    Wrist supination    Grip strength     (Blank rows = not tested)  CERVICAL SPECIAL TESTS:  NT   FUNCTIONAL TESTS:  5 times sit to stand: 18.2 sec   30 seconds chair stand test 9 reps  2WMT: 390ft HR -     Beighton Scale Lumbar (_1/1) Knees (_1/2) Elbows (_2/2)  5th digit (_2/2) Thumb (_2/2)  8/9: knee hyper extension  5 deg on Rt and 10 on Lt   TREATMENT: [facility] Adult PT Treatment:                                                DATE: 11/14/2023 NuStep lvl 5 UE/LE x 4 min for activity tolerance while monitoring HR  Hook-lying PPT x 10 - 5 hold Hook-lying PPT with pilates add 2x10 PPT into bridge 2x10 with pilates ring Pilates SLR 2x10 ea S/L hip abd 2x10 ea Seated bilateral ER 2x10 RTB R shoulder flexion 2x10 3#  [facility] Adult PT Treatment:                                                DATE: 11/09/2023 NuStep lvl 4 UE/LE x 4 min for activity  tolerance while monitoring HR  Supine PPT x 10 - 5 hold PPT into bridge 2x10 with pilates ring Pilates SLR 2x10 ea PPT with shoulder pulldown 3x10 BTB Pilates ring horizontal abd 2x10 with elevation to 90 deg ER isometric walkout x 10 YTB ea shoulder Standing row 2x12 blue band  [facility] Adult PT Treatment:                                                DATE: 10/31/2023 NuStep lvl 3 LE only x 4 min for activity tolerance while monitoring HR Supine PPT x 10 - 5 hold Supine PPT with ball 2x10 - 5 hold Pilates SLR 2x10 ea PPT into bridge 2x10 with pilates ring PPT with shoulder pulldown 2x10 GTB Supine R shoulder flexion 2x10 2# S/L R shoulder ER  2x10 2# Seated horizontal abd 2x10 RTB  [facility] Adult PT Treatment:                                                DATE: 10/12/2023 - 349ft Supine PPT x 10 - 5 hold PPT into bridge 2x10 S/L clamshell 2x10 RTB Seated scapular retraction x 10 - 5 hold R shoulder ER isometric x 10 - 5 hold Seated bilateral ER RTB 3x10  PATIENT EDUCATION:  Education details:  Posture, alignment, neutral zone and joint protection PNE/Explain pain    Joint inflammation/collagen/ligament laxity, muscular support Stretching vs stabilizing  Person educated: Patient Education method: Explanation Education comprehension: verbalized understanding   HOME EXERCISE PROGRAM: Access Code: [email or web address] URL: [email or web address] Date: 10/12/2023 Prepared by: Alm Kingdom  Exercises - Supine Posterior Pelvic Tilt  - 1 x daily - 7 x weekly - 2 sets - 10 reps - 5 sec hold - Supine Bridge  - 1 x daily - 7 x weekly - 2 sets - 10 reps - Clamshell with Resistance  - 1 x daily - 7 x weekly - 2 sets - 15 reps - red band hold - Seated Scapular Retraction  - 1 x daily - 7 x weekly - 2 sets - 10 reps - 3-5 sec hold - Standing Isometric Shoulder External Rotation with Doorway and Towel Roll  - 1 x daily - 7 x weekly - 2 sets - 10 reps - 3-5 sec hold - Shoulder  External Rotation and Scapular Retraction with Resistance  - 1 x daily - 7 x weekly - 3 sets - 10 reps - red band hold  ASSESSMENT:  CLINICAL IMPRESSION: Pt was able to complete all prescribed exercises with no adverse effect. Today we continued to progressed hip/core strengthening exercises with emphasis on neutral spine as well as improving R shoulder dynamic stability. Her HR remained steady steady today with no autonomic symptoms noted. She continues to benefit from skilled PT services, will continue to progress as able.   OBJECTIVE IMPAIRMENTS: cardiopulmonary status limiting activity, decreased activity tolerance, decreased coordination, decreased endurance, decreased mobility, difficulty walking, decreased ROM, decreased strength, impaired UE functional use, postural dysfunction, obesity, and pain.   ACTIVITY LIMITATIONS: carrying, lifting, bending, sitting, standing, squatting, sleeping, stairs, transfers, reach over head, locomotion level, and caring for others  PARTICIPATION LIMITATIONS: meal prep, cleaning, laundry, interpersonal relationship, shopping, and community activity  PERSONAL FACTORS: Past/current experiences, Time since onset of injury/illness/exacerbation, and 3+ comorbidities: dysautonomia/syncope, MCAS, L elbow post surgery  are also affecting patient's functional outcome.   REHAB POTENTIAL: Excellent  CLINICAL DECISION MAKING: Evolving/moderate complexity  EVALUATION COMPLEXITY: Moderate   GOALS: Goals reviewed with patient? Yes  SHORT TERM GOALS: Target date: 11/01/2023   Patient will be able to show independence for initial HEP to include posture, core and hip strength and stability.   Baseline: Goal status: IMET  2.    Patient will be able to complete functional testing for endurance, balance and strength. Baseline:  Goal status:MET  3.  Pt will report improved awareness of core and posture when performing ADLs  Baseline:  Goal status: IN  PROGRESS   LONG TERM GOALS: Target date: 11/29/2023  Patient will be independent with final HEP upon discharge from PT and report consistent benefit following exercise completion.    Baseline:  Goal status: INITIAL  2.  Pt will improve 2 min walk test distance by 50 feet or more with LRAD and no increase in pain.   Baseline:  Goal status: INITIAL  3.  Patient will be I with concepts of joint protection and stability as it pertains to joint hypermobility. Baseline:  Goal status: INITIAL  4.  Pt will be able to demonstrate good standing balance as evidenced by balance recovery from min to mod dynamic balance challenges.   Baseline:  Goal status: INITIAL  5.  Pt will be able to consistently walk dogs 1/2 mile without undue fatigue and pain < 2/10. Baseline:  Goal status: INITIAL    PLAN:  PT FREQUENCY: 2x/week  PT DURATION: 8 weeks  PLANNED INTERVENTIONS: [id number]- PT Re-evaluation, 97750- Physical Performance Testing, 97110-Therapeutic exercises, 97530- Therapeutic activity, [id number]- Neuromuscular re-education, 97535- Self Care, [id number]- Manual therapy, 20560 (1-2 muscles), 20561 (3+ muscles)- Dry Needling, Patient/Family education, Balance training, Taping, Cryotherapy, and Moist heat.  PLAN FOR NEXT SESSION: establish HEP and complete 2 min walk, check BP.HR . Focus on stability training and core control    Alm JAYSON Kingdom, PT 11/14/2023, 5:09 PM

## 2023-11-16 ENCOUNTER — Ambulatory Visit

## 2023-11-16 DIAGNOSIS — M2550 Pain in unspecified joint: Secondary | ICD-10-CM

## 2023-11-16 DIAGNOSIS — Z7409 Other reduced mobility: Secondary | ICD-10-CM

## 2023-11-16 NOTE — Therapy (Signed)
 OUTPATIENT PHYSICAL THERAPY TREATMENT  Patient Name: Amber [name] MRN: [id number] DOB:09/30/1980, 43 y.o., female Today's Date: 11/16/2023  END OF SESSION:  PT End of Session - 11/16/23 1353     Visit Number 6    Number of Visits 17    Date for PT Re-Evaluation 01/04/24    Authorization Type [facility]    PT Start Time 1400    PT End Time 1440   Activity Tolerance Patient tolerated treatment well    Behavior During Therapy WFL for tasks assessed/performed               Past Medical History:  Diagnosis Date   Anxiety    Asthma    Depression    Diabetes (HCC)    GERD (gastroesophageal reflux disease)    History of chicken pox    History of migraine headaches    Migraines    Pituitary adenoma (HCC)    POTS (postural orthostatic tachycardia syndrome)    Seizures (HCC)    Past Surgical History:  Procedure Laterality Date   APPENDECTOMY     CHOLECYSTECTOMY     ESOPHAGEAL MANOMETRY N/A 07/12/2023   Procedure: MANOMETRY, ESOPHAGUS;  Surgeon: San Sandor GAILS, DO;  Location: [facility];  Service: Gastroenterology;  Laterality: N/A;   PH IMPEDANCE STUDY N/A 07/12/2023   Procedure: IMPEDANCE PH STUDY, ESOPHAGUS;  Surgeon: San Sandor GAILS, DO;  Location: [facility];  Service: Gastroenterology;  Laterality: N/A;   Pituiary Tumor     Tumor Removal Pituitary   Patient Active Problem List   Diagnosis Date Noted   EDS (Ehlers-Danlos syndrome) 09/20/2023   Dysautonomia (HCC) 09/20/2023   Bilateral hand swelling 07/25/2023   Palpitations 03/13/2023   Paroxysmal tachycardia (HCC) 03/13/2023   Mixed hyperlipidemia 02/14/2023   Iron  deficiency anemia 01/13/2023   Type 2 diabetes mellitus with hyperglycemia, without long-term current use of insulin  (HCC) 12/28/2022   Asthma 03/23/2021   Hypertriglyceridemia 11/10/2020   Myofascial pain 05/07/2020   Multiple closed fractures of ribs of right side 05/07/2020   Numbness of toes 05/07/2020   Prediabetes 04/28/2020    Pituitary microadenoma (HCC) 04/28/2020   Prolactinoma (HCC) 04/28/2020   Pre-diabetes 12/25/2019   Infertility counseling 12/24/2019   Well woman exam 12/24/2019   Hyperglycemia 06/05/2019   Insomnia 01/07/2019   Bilateral hand pain 11/09/2018   Agoraphobia 01/18/2018   Anxiety with depression 03/24/2017   Migraine 05/11/2016   Pituitary adenoma (HCC) 03/05/2016   Hyperprolactinemia (HCC) 03/02/2016    PCP: Frann Mabel Mt DO  REFERRING PROVIDER: Joane Birmingham MD   REFERRING DIAG: M25.50 (ICD-10-CM) - Hypermobility arthralgia   Rationale for Evaluation and Treatment: Rehabilitation  THERAPY DIAG:  Hypermobility arthralgia  Decreased functional mobility and endurance  ONSET DATE: chronic 5+   SUBJECTIVE:  SUBJECTIVE STATEMENT: Pt presents to PT with reports of continued back and hip/pelvis pain as well as R shoulder discomfort. Feels therapy has been helping her back and hip which is only 4/10 today. R shoulder is 6/10.  EVAL: Pt reports being worked up for about 5 yrs due to hand issues but does recall subluxing hips as a child.  Primary MD gave her some exercises. She has difficulty with stability and balance during functional activities at home.  Since losing wgt (95 lbs) she has had more joint pain.  Seeing Neuro soon for workup for tingling in feet and hands.  She has difficulty standing and walking (avoids shopping), activity in general.  She does walk the dogs every other day (1/2 mile).  Someday she can't make it the full time.  She has difficulty with lifting, hand function and gripping.  Compression gloves at night. She has cubital tunnel elbow braces but she does not wear them much.  She did OT for only 1 visit but did not return, was recommended to follow up about the  hypermobility in her hands at that time.   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 about 1.5 yrs    Has the patient been formally diagnosed with EDS or HSD? If yes indicate the type EDS-type III recent diagnosis    If no ,does the patient feel they may have EDS or HSD?  Patient reports symptoms and/or instability in the following areas:  - Cervical Spine: [x] Pain [] Instability [] Limited ROM [] Paresthesia  - Thoracic Spine: [] Pain [] Instability  - Lumbar Spine: [x] Pain [] Instability [] Frequent locking/giving out  - Shoulder: [] L [x] R  [x] Subluxation [] Dislocations [x] Pain [] Fatigue  - Elbow: [] L [] R  [] Instability [] Hyperextension [] Pain  - Wrist/Hand: [x] L [x] R  [x] Instability [] Fatigue with use [x] Pain  - Hip: [x] L [x] R  [x] Instability [x] Pain [x] Clicking [] Gait deviations  - Knee: [x] L [x] R  [x] Instability [x] Hyperextension [x] Pain  - Ankle/Foot: [x] L [x] R  [x] Instability [] Frequent sprains Pain , Lt. 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, husband is disabled and she helps him with more executive functioning activities ____________________________   Patient-reported Beighton Score (if known): _____8__/9  Clinician-assessed Beighton Score (today): _____8__/9     PERTINENT HISTORY:  MD note:    MS: bilat hip pain w/ sublux in middle school, bilat hand pain and swelling, bilat shoulder instability R>L Skin/Immune reactions: stretchy skin, chronic urticaria Nervous system: dizziness esp w/ transitioning positions Head/Spine: chronic migraines, insomnia CV: tachycardia when transitioning to stand GI: GERD, chronic diarrhea, intermittent  constipation , Genitourinary: none Hands & Feet: swell, paresthesia bilat hands and feet   PAIN:  Are you having pain?  Yes: NPRS scale: 7/10 Pain location: hips lateral and groin Pain description: sore and achy  Aggravating factors: sitting too long Relieving factors: pretzel, Tylenol , heating pad  Are you having pain?  Yes: NPRS scale: 5/10  Pain location: Rt shoulder  Pain description: moving around a lot  Aggravating factors: overuse , handling dog  Relieving factors: heating pad    PRECAUTIONS: None  RED FLAGS: Monitor syncope   WEIGHT BEARING RESTRICTIONS: No  FALLS:  Has patient fallen in last 6 months? 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  DIAGNOSTIC FINDINGS:  none  CARDIO/ORTHOSTATICS: Baseline RHR 99 Standing HR 125  Baseline BP 90/62 Standing BP 101/53 min dizziness    PATIENT SURVEYS:  Quick Dash:  QUICK DASH  Please rate your ability do the following activities in the last week by selecting the number below the appropriate response.   Activities Rating  Open a tight or new jar.  3 = Moderate difficulty  Do heavy household chores (e.g., wash walls, floors). 3 = Moderate difficulty  Carry a shopping bag or briefcase 4 = Severe difficulty  Wash your back. 2 = Mild difficulty  Use a knife to cut food. 2 = Mild difficulty  Recreational activities in which you take  some force or impact through your arm, shoulder or hand (e.g., golf, hammering, tennis, etc.). 3 = Moderate difficulty  During the past week, to what extent has your arm, shoulder or hand problem interfered with your normal social activities with family, friends, neighbors or groups?  3 = Moderately  During the past week, were you limited in your work or other regular daily activities as a result of your arm, shoulder or hand problem? 3 = Moderately limited  During the past week, were you limited in your work or other regular daily activities as a result of your arm, shoulder or hand problem? 3 = Moderate  Tingling (pins and needles) in your arm, shoulder or hand. 3 = Moderate  During the past week, how much difficulty have you had sleeping because of the pain in your arm, shoulder or hand?  3 = Moderate difficulty   (A QuickDASH score may not be calculated if there is greater than 1 missing item.)  Quick Dash Disability/Symptom Score: [(sum of 32 (n) responses/11 (n)] x 25 = 47% able   Minimally Clinically Important Difference (MCID): 15-20 points  (Franchignoni, F. et al. (2013). Minimally clinically important difference of the disabilities of the arm, shoulder, and hand outcome measures (DASH) and its shortened version (Quick DASH). Journal of Orthopaedic & Sports Physical Therapy, 44(1), 30-39)   COGNITION: Overall cognitive status: Within functional limits for tasks assessed     SENSATION: Feet tingling at frequently , some N/T in L elbow from surgery    POSTURE: rounded shoulders, forward head, increased thoracic kyphosis, and posterior pelvic tilt  PALPATION: Sore to palpation Rt shoulder grossly  Skin is extensible and very smooth soft    LUMBAR ROM:   AROM eval  Flexion Palms flat   Extension   Right lateral flexion   Left lateral flexion   Right rotation   Left rotation    (Blank rows = not tested)  LOWER EXTREMITY ROM:  PROM WNL, hypermobile IR >ER   LOWER  EXTREMITY MMT:    MMT Right eval Left eval  Hip flexion 4+ 4+  Hip extension    Hip abduction    Hip adduction    Hip internal rotation    Hip external rotation    Knee flexion  5 5  Knee extension 5 5  Ankle dorsiflexion    Ankle plantarflexion    Ankle inversion    Ankle eversion     (Blank rows = not tested)  LUMBAR SPECIAL TESTS:  NT   CERVICAL ROM:  NT on eval  Active ROM A/PROM (deg) 11/16/2023  Flexion   Extension   Right lateral flexion   Left lateral flexion   Right rotation   Left rotation    (Blank rows = not tested)  UE ROM: WNL  Pain limits Rt shoulder combo ER/ and abduction  UE MMT:  MMT Right eval Left eval  Shoulder flexion 4-pain  4  Shoulder extension    Shoulder abduction 4-pain  4  Shoulder adduction    Shoulder extension    Shoulder internal rotation 4 pain  4+  Shoulder external rotation 4 pain  4+  Middle trapezius    Lower trapezius    Elbow flexion 4 4+  Elbow extension 4 4+  Wrist flexion    Wrist extension    Wrist ulnar deviation    Wrist radial deviation    Wrist pronation    Wrist supination    Grip strength     (Blank rows = not tested)  CERVICAL SPECIAL TESTS:  NT   FUNCTIONAL TESTS:  5 times sit to stand: 18.2 sec   30 seconds chair stand test 9 reps  2WMT: 311ft HR -     Beighton Scale Lumbar (_1/1) Knees (_1/2) Elbows (_2/2)  5th digit (_2/2) Thumb (_2/2)  8/9: knee hyper extension  5 deg on Rt and 10 on Lt   TREATMENT: [facility] Adult PT Treatment:                                                DATE: 11/16/2023 Assessment of tests/measures, goals, and outcomes Hook-lying PPT x 10 - 5 hold PPT into bridge 2x15 with pilates ring Pilates SLR 2x10 ea Supine R shoulder flex 2x10 3# Standing row 3x12 blue band Standing ER 2x10 YTB Shoulder ext with scap retraction 2x15 GTB  [facility] Adult PT Treatment:                                                DATE: 11/14/2023 NuStep lvl 5 UE/LE x 4 min for activity  tolerance while monitoring HR  Hook-lying PPT x 10 - 5 hold Hook-lying PPT with pilates add 2x10 PPT into bridge 2x10 with pilates ring Pilates SLR 2x10 ea S/L hip abd 2x10 ea Seated bilateral ER 2x10 RTB R shoulder flexion 2x10 3#  [facility] Adult PT Treatment:                                                DATE: 11/09/2023 NuStep lvl 4 UE/LE x 4 min for activity tolerance while monitoring HR  Supine PPT x 10 - 5 hold PPT into bridge 2x10 with pilates ring Pilates SLR 2x10 ea PPT with shoulder pulldown 3x10 BTB Pilates ring horizontal abd 2x10 with elevation to 90 deg ER isometric walkout x 10 YTB ea shoulder Standing row  2x12 blue band  [facility] Adult PT Treatment:                                                DATE: 10/31/2023 NuStep lvl 3 LE only x 4 min for activity tolerance while monitoring HR Supine PPT x 10 - 5 hold Supine PPT with ball 2x10 - 5 hold Pilates SLR 2x10 ea PPT into bridge 2x10 with pilates ring PPT with shoulder pulldown 2x10 GTB Supine R shoulder flexion 2x10 2# S/L R shoulder ER 2x10 2# Seated horizontal abd 2x10 RTB  [facility] Adult PT Treatment:                                                DATE: 10/12/2023 - 359ft Supine PPT x 10 - 5 hold PPT into bridge 2x10 S/L clamshell 2x10 RTB Seated scapular retraction x 10 - 5 hold R shoulder ER isometric x 10 - 5 hold Seated bilateral ER RTB 3x10  PATIENT EDUCATION:  Education details:  Posture, alignment, neutral zone and joint protection PNE/Explain pain    Joint inflammation/collagen/ligament laxity, muscular support Stretching vs stabilizing  Person educated: Patient Education method: Explanation Education comprehension: verbalized understanding   HOME EXERCISE PROGRAM: Access Code: [email or web address] URL: [email or web address] Date: 11/16/2023 Prepared by: Alm Kingdom  Exercises - Supine Posterior Pelvic Tilt  - 1 x daily - 7 x weekly - 2 sets - 10 reps - 5 sec hold - Supine Bridge   - 1 x daily - 7 x weekly - 2 sets - 10 reps - Clamshell with Resistance  - 1 x daily - 7 x weekly - 2 sets - 15 reps - red band hold - Seated Scapular Retraction  - 1 x daily - 7 x weekly - 2 sets - 10 reps - 3-5 sec hold - Standing Isometric Shoulder External Rotation with Doorway and Towel Roll  - 1 x daily - 7 x weekly - 2 sets - 10 reps - 3-5 sec hold - Shoulder External Rotation and Scapular Retraction with Resistance  - 1 x daily - 7 x weekly - 3 sets - 10 reps - red band hold - Supine Shoulder Flexion Extension Full Range AROM  - 1 x daily - 7 x weekly - 2 sets - 10 reps - 2lbs hold - Shoulder External Rotation with Anchored Resistance  - 1 x daily - 7 x weekly - 2 sets - 10 reps - yellow band hold  ASSESSMENT:  CLINICAL IMPRESSION: Pt was able to complete all prescribed exercises with no adverse effect. Today we continued to progressed hip/core strengthening exercises with emphasis on neutral spine as well as improving R shoulder dynamic stability. Her HR remained steady steady today with no autonomic symptoms noted. slightly improved. She continues to benefit from skilled PT services, will continue to progress as able.   OBJECTIVE IMPAIRMENTS: cardiopulmonary status limiting activity, decreased activity tolerance, decreased coordination, decreased endurance, decreased mobility, difficulty walking, decreased ROM, decreased strength, impaired UE functional use, postural dysfunction, obesity, and pain.   ACTIVITY LIMITATIONS: carrying, lifting, bending, sitting, standing, squatting, sleeping, stairs, transfers, reach over head, locomotion level, and caring for others  PARTICIPATION LIMITATIONS: meal prep, cleaning, laundry, interpersonal relationship, shopping, and  community activity  PERSONAL FACTORS: Past/current experiences, Time since onset of injury/illness/exacerbation, and 3+ comorbidities: dysautonomia/syncope, MCAS, L elbow post surgery  are also affecting patient's functional  outcome.   REHAB POTENTIAL: Excellent  CLINICAL DECISION MAKING: Evolving/moderate complexity  EVALUATION COMPLEXITY: Moderate   GOALS: Goals reviewed with patient? Yes  SHORT TERM GOALS: Target date: 11/01/2023   Patient will be able to show independence for initial HEP to include posture, core and hip strength and stability.   Baseline: Goal status: MET  2.    Patient will be able to complete functional testing for endurance, balance and strength. Baseline:  Goal status:MET  3.  Pt will report improved awareness of core and posture when performing ADLs  Baseline:  Goal status: IN PROGRESS   LONG TERM GOALS: Target date: 01/04/2024  Patient will be independent with final HEP upon discharge from PT and report consistent benefit following exercise completion.    Baseline:  Goal status: INITIAL  2.  Pt will improve 2 min walk test distance by 50 feet or more with LRAD and no increase in pain.   Baseline: 355 11/16/23: 342ft Goal status: IN PROGRESS  3.  Patient will be I with concepts of joint protection and stability as it pertains to joint hypermobility. Baseline:  Goal status: INITIAL  4.  Pt will be able to demonstrate good standing balance as evidenced by balance recovery from min to mod dynamic balance challenges.   Baseline:  Goal status: INITIAL  5.  Pt will be able to consistently walk dogs 1/2 mile without undue fatigue and pain < 2/10. Baseline:  Goal status: INITIAL    PLAN:  PT FREQUENCY: 2x/week  PT DURATION: 8 weeks  PLANNED INTERVENTIONS: 97164- PT Re-evaluation, 97750- Physical Performance Testing, 97110-Therapeutic exercises, 97530- Therapeutic activity, [id number]- Neuromuscular re-education, 97535- Self Care, 02859- Manual therapy, 20560 (1-2 muscles), 20561 (3+ muscles)- Dry Needling, Patient/Family education, Balance training, Taping, Cryotherapy, and Moist heat.  PLAN FOR NEXT SESSION: establish HEP and complete 2 min walk, check BP.HR .  Focus on stability training and core control    Alm JAYSON Kingdom, PT 11/16/2023, 2:13 PM

## 2023-11-17 ENCOUNTER — Telehealth: Payer: Self-pay

## 2023-11-17 NOTE — Telephone Encounter (Signed)
 Initial Comment Caller states she made an appointment for Wednesday for ringing in her ears. The patient states the left ear has a lower tone and the right ear is a whooshing. The caller would like to speak with a nurse. Translation No Nurse Assessment Nurse: Juanito, RN, Olam Date/Time (Eastern Time): 11/17/2023 3:03:06 PM Confirm and document reason for call. If symptomatic, describe symptoms. ---Caller has an appointment next Wednesday and has been having ringing in her ears. And caller is also asking for a handicapped parking placard. Tinnitus 2-3 days. Caller states left hear it sounds like the emergency broadcast tone like from the tv. The right ear is a whooshing sound, like her pulse. Caller states no new dizziness. No other symptoms. Does the patient have any new or worsening symptoms? ---Yes Will a triage be completed? ---Yes Related visit to physician within the last 2 weeks? ---No Does the PT have any chronic conditions? (i.e. diabetes, asthma, this includes High risk factors for pregnancy, etc.) ---Yes List chronic conditions. ---POTTS, Ehlers's Danlos syndrome, Mkast, Pituitary tumor, migraines, low blood pressure Is the patient pregnant or possibly pregnant? (Ask all females between the ages of 72-55) ---No Is this a behavioral health or substance abuse call? ---No PLEASE NOTE: All timestamps contained within this report are represented as [place] Standard Time. CONFIDENTIALTY NOTICE: This fax transmission is intended only for the addressee. It contains information that is legally privileged, confidential or otherwise protected from use or disclosure. If you are not the intended recipient, you are strictly prohibited from reviewing, disclosing, copying using or disseminating any of this information or taking any action in reliance on or regarding this information. If you have received this fax in error, please notify us  immediately by telephone so that we can arrange for  its return to us . Phone: [phone], Toll-Free: [phone], Fax: [phone] Amber [name] [birth year] Page: 1 of2 CallId: [id number] Guidelines Guideline Title Affirmed Question Affirmed Notes Nurse Date/Time Titus Time) Tinnitus MODERATESEVERE tinnitus (i.e., interferes with work, school, or sleep) Leos, RN, Olam 11/17/2023 3:06:40 PM Disp. Time Titus Time) Disposition Final User 11/17/2023 3:09:01 PM SEE PCP WITHIN 3 DAYS Yes Leos, RN, Olam Final Disposition 11/17/2023 3:09:01 PM SEE PCP WITHIN 3 DAYS Yes Leos, RN, Olam [place] Disagree/Comply Comply Caller Understands Yes PreDisposition Call Doctor Care Advice Given Per Guideline SEE PCP WITHIN 3 DAYS: * You need to be seen within 2 or 3 days. * PCP VISIT: Call your doctor (or NP/PA) during regular office hours and make an appointment. A clinic or urgent care center are good places to go for care if your doctor's office is closed or you can't get an appointment. NOTE: If office will be open tomorrow, tell caller to call then, not in 3 days. * Please bring a list of your current medicines when you go to see the doctor. CALL BACK IF: * Earache occurs * You become worse CARE ADVICE given per Tinnitus (Adult) guideline. Referrals REFERRED TO PCP OFFICE

## 2023-11-17 NOTE — Telephone Encounter (Signed)
 Appt scheduled 11/22/23- triage nurse recommended her to be seen within 3 days.

## 2023-11-22 ENCOUNTER — Encounter: Payer: Self-pay | Admitting: Family Medicine

## 2023-11-22 ENCOUNTER — Ambulatory Visit (INDEPENDENT_AMBULATORY_CARE_PROVIDER_SITE_OTHER): Admitting: Family Medicine

## 2023-11-22 VITALS — BP 110/80 | HR 93 | Temp 98.1°F | Resp 18 | Ht 66.0 in | Wt 191.4 lb

## 2023-11-22 DIAGNOSIS — H93A1 Pulsatile tinnitus, right ear: Secondary | ICD-10-CM | POA: Diagnosis not present

## 2023-11-22 NOTE — Progress Notes (Signed)
 Chief Complaint  Patient presents with   Tinnitus    Both ears    Subjective: Patient is a 43 y.o. female here for ringing in her ears.  Happens at night. Constant ringing in the L ear. She hears a whooshing in the R ear. Going on for 2 weeks. No new dizziness or trauma.   Past Medical History:  Diagnosis Date   Anxiety    Asthma    Depression    Diabetes (HCC)    GERD (gastroesophageal reflux disease)    History of chicken pox    History of migraine headaches    Migraines    Pituitary adenoma (HCC)    POTS (postural orthostatic tachycardia syndrome)    Seizures (HCC)     Objective: BP 110/80   Pulse 93   Temp 98.1 F (36.7 C)   Resp 18   Ht 5' 6 (1.676 m)   Wt 191 lb 6.4 oz (86.8 kg)   LMP 11/20/2023 (Approximate)   SpO2 95%   BMI 30.89 kg/m  General: Awake, appears stated age HEENT: 100% cerumen obstruction on R, 40% obstruction on L, TM neg, nares patent w/o rhinorrhea, MMM, no drainage Heart: RRR, no bruits Lungs: No accessory muscle use Psych: Age appropriate judgment and insight, normal affect and mood  Assessment and Plan: Pulsatile tinnitus of right ear - Plan: MR Angiogram Head Wo Contrast  New problem w uncertain prog. Will ck MRA brain. No neuro s/s's otherwise. If she does, will go to ER. F/u as originally scheduled.  The patient voiced understanding and agreement to the plan.  Mabel Mt [place], DO 11/22/23  4:03 PM

## 2023-11-22 NOTE — Patient Instructions (Signed)
 Someone will be in touch regarding your scan.   Stroke symptoms should trigger you to go to the ER as we discussed.   Flush your ears out please. If your symptoms resolve, let us  know.   Let us  know if you need anything.

## 2023-11-24 ENCOUNTER — Inpatient Hospital Stay: Attending: Medical Oncology

## 2023-11-24 VITALS — BP 90/68 | HR 100 | Temp 98.4°F | Resp 19

## 2023-11-24 DIAGNOSIS — D509 Iron deficiency anemia, unspecified: Secondary | ICD-10-CM

## 2023-11-24 DIAGNOSIS — E538 Deficiency of other specified B group vitamins: Secondary | ICD-10-CM | POA: Insufficient documentation

## 2023-11-24 MED ORDER — CYANOCOBALAMIN 1000 MCG/ML IJ SOLN
1000.0000 ug | Freq: Once | INTRAMUSCULAR | Status: AC
  Administered 2023-11-24: 1000 ug via INTRAMUSCULAR
  Filled 2023-11-24: qty 1

## 2023-12-05 ENCOUNTER — Ambulatory Visit

## 2023-12-05 NOTE — Therapy (Incomplete)
 OUTPATIENT PHYSICAL THERAPY TREATMENT  Patient Name: Amber [name] MRN: [id number] DOB:[birth year], 43 y.o., female Today's Date: 12/05/2023  END OF SESSION:  PT End of Session - 11/16/23 1353     Visit Number 6    Number of Visits 17    Date for PT Re-Evaluation 01/04/24    Authorization Type [facility]    PT Start Time 1400    PT End Time 1440   Activity Tolerance Patient tolerated treatment well    Behavior During Therapy WFL for tasks assessed/performed               Past Medical History:  Diagnosis Date   Anxiety    Asthma    Depression    Diabetes (HCC)    GERD (gastroesophageal reflux disease)    History of chicken pox    History of migraine headaches    Migraines    Pituitary adenoma (HCC)    POTS (postural orthostatic tachycardia syndrome)    Seizures (HCC)    Past Surgical History:  Procedure Laterality Date   APPENDECTOMY     CHOLECYSTECTOMY     ESOPHAGEAL MANOMETRY N/A 07/12/2023   Procedure: MANOMETRY, ESOPHAGUS;  Surgeon: San Sandor GAILS, DO;  Location: [facility];  Service: Gastroenterology;  Laterality: N/A;   PH IMPEDANCE STUDY N/A 07/12/2023   Procedure: IMPEDANCE PH STUDY, ESOPHAGUS;  Surgeon: San Sandor GAILS, DO;  Location: [facility];  Service: Gastroenterology;  Laterality: N/A;   Pituiary Tumor     Tumor Removal Pituitary   Patient Active Problem List   Diagnosis Date Noted   EDS (Ehlers-Danlos syndrome) 09/20/2023   Dysautonomia (HCC) 09/20/2023   Bilateral hand swelling 07/25/2023   Palpitations 03/13/2023   Paroxysmal tachycardia (HCC) 03/13/2023   Mixed hyperlipidemia 02/14/2023   Iron  deficiency anemia 01/13/2023   Type 2 diabetes mellitus with hyperglycemia, without long-term current use of insulin  (HCC) 12/28/2022   Asthma 03/23/2021   Hypertriglyceridemia 11/10/2020   Myofascial pain 05/07/2020   Multiple closed fractures of ribs of right side 05/07/2020   Numbness of toes 05/07/2020   Prediabetes 04/28/2020    Pituitary microadenoma (HCC) 04/28/2020   Prolactinoma (HCC) 04/28/2020   Pre-diabetes 12/25/2019   Infertility counseling 12/24/2019   Well woman exam 12/24/2019   Hyperglycemia 06/05/2019   Insomnia 01/07/2019   Bilateral hand pain 11/09/2018   Agoraphobia 01/18/2018   Anxiety with depression 03/24/2017   Migraine 05/11/2016   Pituitary adenoma (HCC) 03/05/2016   Hyperprolactinemia (HCC) 03/02/2016    PCP: Frann Mabel Mt DO  REFERRING PROVIDER: Joane Birmingham MD   REFERRING DIAG: 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  ONSET DATE: chronic 5+   SUBJECTIVE:  SUBJECTIVE STATEMENT: Pt presents to PT with reports of continued back and hip/pelvis pain as well as R shoulder discomfort. Feels therapy has been helping her back and hip which is only 4/10 today. R shoulder is 6/10.  EVAL: Pt reports being worked up for about 5 yrs due to hand issues but does recall subluxing hips as a child.  Primary MD gave her some exercises. She has difficulty with stability and balance during functional activities at home.  Since losing wgt (95 lbs) she has had more joint pain.  Seeing Neuro soon for workup for tingling in feet and hands.  She has difficulty standing and walking (avoids shopping), activity in general.  She does walk the dogs every other day (1/2 mile).  Someday she can't make it the full time.  She has difficulty with lifting, hand function and gripping.  Compression gloves at night. She has cubital tunnel elbow braces but she does not wear them much.  She did OT for only 1 visit but did not return, was recommended to follow up about the hypermobility in her hands at that time.   Syncope and  symptoms of dysautonomia have been going on for about 1.5 yrs    Has the patient been formally diagnosed with EDS or HSD? If yes indicate the type EDS-type III recent diagnosis    If no ,does the patient feel they may have EDS or HSD?  Patient reports symptoms and/or instability in the following areas:  - Cervical Spine: [x] Pain [] Instability [] Limited ROM [] Paresthesia  - Thoracic Spine: [] Pain [] Instability  - Lumbar Spine: [x] Pain [] Instability [] Frequent locking/giving out  - Shoulder: [] L [x] R  [x] Subluxation [] Dislocations [x] Pain [] Fatigue  - Elbow: [] L [] R  [] Instability [] Hyperextension [] Pain  - Wrist/Hand: [x] L [x] R  [x] Instability [] Fatigue with use [x] Pain  - Hip: [x] L [x] R  [x] Instability [x] Pain [x] Clicking [] Gait deviations  - Knee: [x] L [x] R  [x] Instability [x] Hyperextension [x] Pain  - Ankle/Foot: [x] L [x] R  [x] Instability [] Frequent sprains Pain , Lt. 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, husband is disabled and she helps him with more executive functioning activities ____________________________   Patient-reported Beighton Score (if known): _____8__/9  Clinician-assessed Beighton Score (today): _____8__/9     PERTINENT HISTORY:  MD note:    MS: bilat hip pain w/ sublux in middle school, bilat hand pain and swelling, bilat shoulder instability R>L Skin/Immune reactions: stretchy skin, chronic urticaria Nervous system: dizziness esp w/ transitioning positions Head/Spine: chronic migraines, insomnia CV: tachycardia when transitioning to stand GI: GERD, chronic diarrhea, intermittent constipation , Genitourinary: none Hands & Feet: swell,  paresthesia bilat hands and feet   PAIN:  Are you having pain?  Yes: NPRS scale: 7/10 Pain location: hips lateral and groin Pain description: sore and achy  Aggravating factors: sitting too long Relieving factors: pretzel, Tylenol , heating pad  Are you having pain?  Yes: NPRS scale: 5/10  Pain location: Rt shoulder  Pain description: moving around a lot  Aggravating factors: overuse , handling dog  Relieving factors: heating pad    PRECAUTIONS: None  RED FLAGS: Monitor syncope   WEIGHT BEARING RESTRICTIONS: No  FALLS:  Has patient fallen in last 6 months? 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  DIAGNOSTIC FINDINGS:  none  CARDIO/ORTHOSTATICS: Baseline RHR 99 Standing HR 125  Baseline BP 90/62 Standing BP 101/53 min dizziness    PATIENT SURVEYS:  Quick Dash:  QUICK DASH  Please rate your ability do the following activities in the last week by selecting the number below the appropriate response.   Activities Rating  Open a tight or new jar.  3 = Moderate difficulty  Do heavy household chores (e.g., wash walls, floors). 3 = Moderate difficulty  Carry a shopping bag or briefcase 4 = Severe difficulty  Wash your back. 2 = Mild difficulty  Use a knife to cut food. 2 = Mild difficulty  Recreational activities in which you take some force or impact through your arm, shoulder or hand  (e.g., golf, hammering, tennis, etc.). 3 = Moderate difficulty  During the past week, to what extent has your arm, shoulder or hand problem interfered with your normal social activities with family, friends, neighbors or groups?  3 = Moderately  During the past week, were you limited in your work or other regular daily activities as a result of your arm, shoulder or hand problem? 3 = Moderately limited  During the past week, were you limited in your work or other regular daily activities as a result of your arm, shoulder or hand problem? 3 = Moderate  Tingling (pins and needles) in your arm, shoulder or hand. 3 = Moderate  During the past week, how much difficulty have you had sleeping because of the pain in your arm, shoulder or hand?  3 = Moderate difficulty   (A QuickDASH score may not be calculated if there is greater than 1 missing item.)  Quick Dash Disability/Symptom Score: [(sum of 32 (n) responses/11 (n)] x 25 = 47% able   Minimally Clinically Important Difference (MCID): 15-20 points  (Franchignoni, F. et al. (2013). Minimally clinically important difference of the disabilities of the arm, shoulder, and hand outcome measures (DASH) and its shortened version (Quick DASH). Journal of Orthopaedic & Sports Physical Therapy, 44(1), 30-39)   COGNITION: Overall cognitive status: Within functional limits for tasks assessed     SENSATION: Feet tingling at frequently , some N/T in L elbow from surgery    POSTURE: rounded shoulders, forward head, increased thoracic kyphosis, and posterior pelvic tilt  PALPATION: Sore to palpation Rt shoulder grossly  Skin is extensible and very smooth soft    LUMBAR ROM:   AROM eval  Flexion Palms flat   Extension   Right lateral flexion   Left lateral flexion   Right rotation   Left rotation    (Blank rows = not tested)  LOWER EXTREMITY ROM:  PROM WNL, hypermobile IR >ER   LOWER EXTREMITY MMT:    MMT Right eval Left eval  Hip flexion  4+ 4+  Hip extension    Hip abduction    Hip adduction    Hip internal rotation    Hip external rotation    Knee flexion  5 5  Knee extension 5 5  Ankle dorsiflexion    Ankle plantarflexion    Ankle inversion    Ankle eversion     (Blank rows = not tested)  LUMBAR SPECIAL TESTS:  NT   CERVICAL ROM:  NT on eval  Active ROM A/PROM (deg) 12/05/2023  Flexion   Extension   Right lateral flexion   Left lateral flexion   Right rotation   Left rotation    (Blank rows = not tested)  UE ROM: WNL  Pain limits Rt shoulder combo ER/ and abduction  UE MMT:  MMT Right eval Left eval  Shoulder flexion 4-pain  4  Shoulder extension    Shoulder abduction 4-pain  4  Shoulder adduction    Shoulder extension    Shoulder internal rotation 4 pain  4+  Shoulder external rotation 4 pain  4+  Middle trapezius    Lower trapezius    Elbow flexion 4 4+  Elbow extension 4 4+  Wrist flexion    Wrist extension    Wrist ulnar deviation    Wrist radial deviation    Wrist pronation    Wrist supination    Grip strength     (Blank rows = not tested)  CERVICAL SPECIAL TESTS:  NT   FUNCTIONAL TESTS:  5 times sit to stand: 18.2 sec   30 seconds chair stand test 9 reps  2WMT: 330ft HR -     Beighton Scale Lumbar (_1/1) Knees (_1/2) Elbows (_2/2)  5th digit (_2/2) Thumb (_2/2)  8/9: knee hyper extension  5 deg on Rt and 10 on Lt   TREATMENT: [facility] Adult PT Treatment:                                                DATE: 11/16/2023 Assessment of tests/measures, goals, and outcomes Hook-lying PPT x 10 - 5 hold PPT into bridge 2x15 with pilates ring Pilates SLR 2x10 ea Supine R shoulder flex 2x10 3# Standing row 3x12 blue band Standing ER 2x10 YTB Shoulder ext with scap retraction 2x15 GTB  [facility] Adult PT Treatment:                                                DATE: 11/14/2023 NuStep lvl 5 UE/LE x 4 min for activity tolerance while monitoring HR  Hook-lying PPT x 10 - 5  hold Hook-lying PPT with pilates add 2x10 PPT into bridge 2x10 with pilates ring Pilates SLR 2x10 ea S/L hip abd 2x10 ea Seated bilateral ER 2x10 RTB R shoulder flexion 2x10 3#  [facility] Adult PT Treatment:                                                DATE: 11/09/2023 NuStep lvl 4 UE/LE x 4 min for activity tolerance while monitoring HR  Supine PPT x 10 - 5 hold PPT into bridge 2x10 with pilates ring Pilates SLR 2x10 ea PPT with shoulder pulldown 3x10 BTB Pilates ring horizontal abd 2x10 with elevation to 90 deg ER isometric walkout x 10 YTB ea shoulder Standing row  2x12 blue band  [facility] Adult PT Treatment:                                                DATE: 10/31/2023 NuStep lvl 3 LE only x 4 min for activity tolerance while monitoring HR Supine PPT x 10 - 5 hold Supine PPT with ball 2x10 - 5 hold Pilates SLR 2x10 ea PPT into bridge 2x10 with pilates ring PPT with shoulder pulldown 2x10 GTB Supine R shoulder flexion 2x10 2# S/L R shoulder ER 2x10 2# Seated horizontal abd 2x10 RTB  [facility] Adult PT Treatment:                                                DATE: 10/12/2023 - 392ft Supine PPT x 10 - 5 hold PPT into bridge 2x10 S/L clamshell 2x10 RTB Seated scapular retraction x 10 - 5 hold R shoulder ER isometric x 10 - 5 hold Seated bilateral ER RTB 3x10  PATIENT EDUCATION:  Education details:  Posture, alignment, neutral zone and joint protection PNE/Explain pain    Joint inflammation/collagen/ligament laxity, muscular support Stretching vs stabilizing  Person educated: Patient Education method: Explanation Education comprehension: verbalized understanding   HOME EXERCISE PROGRAM: Access Code: [email or web address] URL: [email or web address] Date: 11/16/2023 Prepared by: Alm Kingdom  Exercises - Supine Posterior Pelvic Tilt  - 1 x daily - 7 x weekly - 2 sets - 10 reps - 5 sec hold - Supine Bridge  - 1 x daily - 7 x weekly - 2 sets - 10 reps - Clamshell  with Resistance  - 1 x daily - 7 x weekly - 2 sets - 15 reps - red band hold - Seated Scapular Retraction  - 1 x daily - 7 x weekly - 2 sets - 10 reps - 3-5 sec hold - Standing Isometric Shoulder External Rotation with Doorway and Towel Roll  - 1 x daily - 7 x weekly - 2 sets - 10 reps - 3-5 sec hold - Shoulder External Rotation and Scapular Retraction with Resistance  - 1 x daily - 7 x weekly - 3 sets - 10 reps - red band hold - Supine Shoulder Flexion Extension Full Range AROM  - 1 x daily - 7 x weekly - 2 sets - 10 reps - 2lbs hold - Shoulder External Rotation with Anchored Resistance  - 1 x daily - 7 x weekly - 2 sets - 10 reps - yellow band hold  ASSESSMENT:  CLINICAL IMPRESSION: Pt was able to complete all prescribed exercises with no adverse effect. Today we continued to progressed hip/core strengthening exercises with emphasis on neutral spine as well as improving R shoulder dynamic stability. Her HR remained steady steady today with no autonomic symptoms noted. slightly improved. She continues to benefit from skilled PT services, will continue to progress as able.   OBJECTIVE IMPAIRMENTS: cardiopulmonary status limiting activity, decreased activity tolerance, decreased coordination, decreased endurance, decreased mobility, difficulty walking, decreased ROM, decreased strength, impaired UE functional use, postural dysfunction, obesity, and pain.   ACTIVITY LIMITATIONS: carrying, lifting, bending, sitting, standing, squatting, sleeping, stairs, transfers, reach over head, locomotion level, and caring for others  PARTICIPATION LIMITATIONS: meal prep, cleaning, laundry, interpersonal relationship, shopping, and  community activity  PERSONAL FACTORS: Past/current experiences, Time since onset of injury/illness/exacerbation, and 3+ comorbidities: dysautonomia/syncope, MCAS, L elbow post surgery  are also affecting patient's functional outcome.   REHAB POTENTIAL: Excellent  CLINICAL  DECISION MAKING: Evolving/moderate complexity  EVALUATION COMPLEXITY: Moderate   GOALS: Goals reviewed with patient? Yes  SHORT TERM GOALS: Target date: 11/01/2023   Patient will be able to show independence for initial HEP to include posture, core and hip strength and stability.   Baseline: Goal status: MET  2.    Patient will be able to complete functional testing for endurance, balance and strength. Baseline:  Goal status:MET  3.  Pt will report improved awareness of core and posture when performing ADLs  Baseline:  Goal status: IN PROGRESS   LONG TERM GOALS: Target date: 01/04/2024  Patient will be independent with final HEP upon discharge from PT and report consistent benefit following exercise completion.    Baseline:  Goal status: INITIAL  2.  Pt will improve 2 min walk test distance by 50 feet or more with LRAD and no increase in pain.   Baseline: 355 11/16/23: 372ft Goal status: IN PROGRESS  3.  Patient will be I with concepts of joint protection and stability as it pertains to joint hypermobility. Baseline:  Goal status: INITIAL  4.  Pt will be able to demonstrate good standing balance as evidenced by balance recovery from min to mod dynamic balance challenges.   Baseline:  Goal status: INITIAL  5.  Pt will be able to consistently walk dogs 1/2 mile without undue fatigue and pain < 2/10. Baseline:  Goal status: INITIAL    PLAN:  PT FREQUENCY: 2x/week  PT DURATION: 8 weeks  PLANNED INTERVENTIONS: 97164- PT Re-evaluation, 97750- Physical Performance Testing, 97110-Therapeutic exercises, 97530- Therapeutic activity, [id number]- Neuromuscular re-education, 97535- Self Care, [id number]- Manual therapy, 20560 (1-2 muscles), 20561 (3+ muscles)- Dry Needling, Patient/Family education, Balance training, Taping, Cryotherapy, and Moist heat.  PLAN FOR NEXT SESSION: establish HEP and complete 2 min walk, check BP.HR . Focus on stability training and core control    Alm JAYSON Kingdom, PT 12/05/2023, 10:02 AM

## 2023-12-07 ENCOUNTER — Encounter

## 2023-12-10 ENCOUNTER — Other Ambulatory Visit: Payer: Self-pay | Admitting: Neurology

## 2023-12-11 ENCOUNTER — Other Ambulatory Visit: Payer: Self-pay

## 2023-12-11 ENCOUNTER — Encounter (HOSPITAL_BASED_OUTPATIENT_CLINIC_OR_DEPARTMENT_OTHER): Payer: Self-pay | Admitting: Emergency Medicine

## 2023-12-11 ENCOUNTER — Emergency Department (HOSPITAL_BASED_OUTPATIENT_CLINIC_OR_DEPARTMENT_OTHER)
Admission: EM | Admit: 2023-12-11 | Discharge: 2023-12-12 | Disposition: A | Discharge status: Home / Self Care | Attending: Emergency Medicine | Admitting: Emergency Medicine

## 2023-12-11 DIAGNOSIS — Z87891 Personal history of nicotine dependence: Secondary | ICD-10-CM | POA: Diagnosis not present

## 2023-12-11 DIAGNOSIS — R7989 Other specified abnormal findings of blood chemistry: Secondary | ICD-10-CM | POA: Diagnosis not present

## 2023-12-11 DIAGNOSIS — Z91010 Allergy to peanuts: Secondary | ICD-10-CM | POA: Insufficient documentation

## 2023-12-11 DIAGNOSIS — R519 Headache, unspecified: Secondary | ICD-10-CM | POA: Diagnosis present

## 2023-12-11 DIAGNOSIS — G43809 Other migraine, not intractable, without status migrainosus: Secondary | ICD-10-CM | POA: Insufficient documentation

## 2023-12-11 LAB — BASIC METABOLIC PANEL WITH GFR
Anion gap: 11 (ref 5–15)
BUN: 14 mg/dL (ref 6–20)
CO2: 22 mmol/L (ref 22–32)
Calcium: 9.3 mg/dL (ref 8.9–10.3)
Chloride: 105 mmol/L (ref 98–111)
Creatinine, Ser: 1.08 mg/dL — ABNORMAL HIGH (ref 0.44–1.00)
GFR, Estimated: >60 mL/min (ref >60)
Glucose, Bld: 78 mg/dL (ref 70–99)
Potassium: 3.8 mmol/L (ref 3.5–5.1)
Sodium: 138 mmol/L (ref 135–145)

## 2023-12-11 LAB — CBC
HCT: 38.9 % (ref 36.0–46.0)
Hemoglobin: 13.1 g/dL (ref 12.0–15.0)
MCH: 29.2 pg (ref 26.0–34.0)
MCHC: 33.7 g/dL (ref 30.0–36.0)
MCV: 86.8 fL (ref 80.0–100.0)
Platelets: 352 K/uL (ref 150–400)
RBC: 4.48 MIL/uL (ref 3.87–5.11)
RDW: 13.7 % (ref 11.5–15.5)
WBC: 6.5 K/uL (ref 4.0–10.5)
nRBC: 0 % (ref 0.0–0.2)

## 2023-12-11 LAB — LIPASE, BLOOD: Lipase: 16 U/L (ref 11–51)

## 2023-12-11 MED ORDER — HYDROMORPHONE HCL 1 MG/ML IJ SOLN
1.0000 mg | Freq: Once | INTRAMUSCULAR | Status: AC
  Administered 2023-12-11: 1 mg via INTRAVENOUS
  Filled 2023-12-11: qty 1

## 2023-12-11 MED ORDER — DIPHENHYDRAMINE HCL 50 MG/ML IJ SOLN
25.0000 mg | Freq: Once | INTRAMUSCULAR | Status: AC
  Administered 2023-12-11: 25 mg via INTRAVENOUS
  Filled 2023-12-11: qty 1

## 2023-12-11 MED ORDER — METOCLOPRAMIDE HCL 5 MG/ML IJ SOLN
10.0000 mg | Freq: Once | INTRAMUSCULAR | Status: AC
Start: 2023-12-11 — End: 2023-12-11
  Administered 2023-12-11: 10 mg via INTRAVENOUS
  Filled 2023-12-11: qty 2

## 2023-12-11 MED ORDER — SODIUM CHLORIDE 0.9 % IV BOLUS
1000.0000 mL | Freq: Once | INTRAVENOUS | Status: AC
  Administered 2023-12-11: 1000 mL via INTRAVENOUS

## 2023-12-11 MED ORDER — DEXAMETHASONE SODIUM PHOSPHATE 10 MG/ML IJ SOLN
10.0000 mg | Freq: Once | INTRAMUSCULAR | Status: AC
  Administered 2023-12-11: 10 mg via INTRAVENOUS
  Filled 2023-12-11: qty 1

## 2023-12-11 NOTE — Discharge Instructions (Signed)
 Continue your migraine medications as previously directed.  Follow-up with your neurologist in regard to your chronic migraines.  Return to the emergency department if your symptoms worsen.

## 2023-12-11 NOTE — ED Triage Notes (Signed)
 Migraine  since 2 am vomiting

## 2023-12-11 NOTE — ED Provider Notes (Signed)
 [facility] Provider Note   CSN: [id number] Arrival date & time: 12/11/23  1800     Patient presents with: Migraine   Amber [name] is a 43 y.o. female.   43 year old female presenting with migraine.  Patient reports that migraine woke her up around 4 AM, she tried her usual migraine relief medications including Nurtec/Benadryl /tramadol  with minimal relief of her symptoms.  She reports left-sided headache, no vision changes.  She is also endorsing nausea/vomiting, which is consistent with her typical migraines.  She has monthly injectable medication for control of her migraines and is scheduled to have this done in 1 week.  She is followed by neurology.  Denies fever, diarrhea, abdominal pain.   Migraine       Prior to Admission medications   Medication Sig Start Date End Date Taking? Authorizing Provider  cabergoline  (DOSTINEX ) 0.5 MG tablet 2.5 mg weekly (Two tabs on Mondays, 1 tab on wednesdays and 2 tabs on Fridays) 01/09/23   Shamleffer, Ibtehal Jaralla, MD  cetirizine (ZYRTEC) 10 MG chewable tablet Chew 10 mg by mouth in the morning and at bedtime.    [provider]  Cholecalciferol (TRUE VITAMIN D3) 1.25 MG (50000 UT) capsule Take 1 capsule (50,000 Units total) by mouth daily. 10/11/23   Frann Mabel Mt, DO  Continuous Glucose Sensor (DEXCOM G7 SENSOR) MISC USE 1 SENSOR TOPICALLY EVERY 10 DAYS 07/27/23   Frann Mabel Mt, DO  cromolyn  (GASTROCROM ) 100 MG/5ML solution Take 5 mLs (100 mg total) by mouth 4 (four) times daily -  before meals and at bedtime. 10/10/23   Lorin Norris, MD  cyanocobalamin  (VITAMIN B12) 1000 MCG/ML injection Inject 1 mL (1,000 mcg total) into the muscle every 14 (fourteen) days. 08/31/23   Tonette Lauraine HERO, PA-C  dexlansoprazole  (DEXILANT ) 60 MG capsule Take 1 capsule (60 mg total) by mouth daily. 07/24/23   Cirigliano, Vito V, DO  EPINEPHrine  (EPIPEN  2-PAK) 0.3 mg/0.3 mL IJ SOAJ  injection Inject 0.3 mg into the muscle as needed for anaphylaxis. 06/14/23   Frann Mabel Mt, DO  Erenumab -aooe (AIMOVIG ) 140 MG/ML SOAJ Inject 140 mg as directed subcutaneously once a month 07/04/23   Skeet Juliene SAUNDERS, DO  escitalopram  (LEXAPRO ) 10 MG tablet Take 1 tablet (10 mg total) by mouth daily. 07/27/23   Frann Mabel Mt, DO  famotidine  (PEPCID ) 20 MG tablet Take 20 mg by mouth 2 (two) times daily.    [provider]  fenofibrate  (TRICOR ) 145 MG tablet Take 1 tablet (145 mg total) by mouth daily. 05/22/23   Frann Mabel Mt, DO  folic acid  (FOLVITE ) 1 MG tablet Take 1 tablet (1 mg total) by mouth daily. 05/23/23   Tonette Lauraine HERO, PA-C  glucose blood (ACCU-CHEK GUIDE TEST) test strip Use to Check Blood [place] 09/06/23   Frann Mabel Mt, DO  glucose blood (ACCU-CHEK GUIDE) test strip 1 each by Other route daily in the afternoon. Use as instructed 01/06/23   Shamleffer, Ibtehal Jaralla, MD  ipratropium (ATROVENT ) 0.06 % nasal spray Place 2 sprays into both nostrils 4 (four) times daily. 09/29/23   Lorin Norris, MD  Menaquinone-7 (VITAMIN K2) 100 MCG CAPS Take 1 Dose by mouth daily.    [provider]  minoxidil  (LONITEN ) 2.5 MG tablet Take 1 tablet (2.5 mg total) by mouth daily. 11/01/23   Frann Mabel Mt, DO  omalizumab  (XOLAIR ) 150 MG/ML prefilled syringe Inject 300 mg into the skin every 28 (twenty-eight) days. 06/22/23   Lorin Norris,  MD  ondansetron  (ZOFRAN -ODT) 8 MG disintegrating tablet Take 1 tablet (8 mg total) by mouth every 8 (eight) hours as needed for nausea or vomiting. 09/06/23   Skeet, Adam R, DO  QUEtiapine  (SEROQUEL ) 300 MG tablet Take 1 tablet (300 mg total) by mouth at bedtime. 05/22/23   Frann Mabel Mt, DO  Riboflavin 400 MG CAPS Take 400 mg by mouth daily.    [provider]  Rimegepant Sulfate (NURTEC) 75 MG TBDP Take 1 tablet (75 mg total) by mouth as needed (Daily as needed for a Migraine). Maximum 1  tablet in 24 hours.  Quantity 8. 09/06/23   Skeet Juliene SAUNDERS, DO  rosuvastatin  (CRESTOR ) 20 MG tablet Take 1 tablet (20 mg total) by mouth daily. 07/27/23   Frann Mabel Mt, DO  Syringe/Needle, Disp, 25G X 1 1 ML MISC 1 Dose by Does not apply route every 14 (fourteen) days. 08/31/23   Tonette Lauraine HERO, PA-C  tirzepatide  (MOUNJARO ) 15 MG/0.5ML Pen Inject 15 mg into the skin once a week. 09/06/23   Frann Mabel Mt, DO  topiramate  (TOPAMAX ) 100 MG tablet Take 1 tablet (100 mg total) by mouth at bedtime. 09/06/23   Skeet Juliene SAUNDERS, DO  traMADol  (ULTRAM ) 50 MG tablet Take 1 tablet (50 mg total) by mouth every 6 (six) hours as needed. 09/15/23   Skeet Juliene SAUNDERS, DO    Allergies: Bee venom, Chlorhexidine, Ibuprofen, Iodine, Peanut-containing drug products, Sesame seed (diagnostic), Shellfish allergy , Soybean-containing drug products, Sulfa antibiotics, Triptans, Lunesta  [eszopiclone ], Almond (diagnostic), Aspirin, Cashew nut (anacardium occidentale) skin test, Hazelnut (filbert), Nsaids, Tape, Walnut, and Wheat    Review of Systems  Updated Vital Signs  Vitals:   12/11/23 2130 12/11/23 2230 12/11/23 2245 12/11/23 2300  BP: 99/77 96/73  99/66  Pulse: 80 81 77 79  Resp: 16   15  Temp:      TempSrc:      SpO2: 99% 100% 100% 100%     Physical Exam Vitals and nursing note reviewed.  HENT:     Head: Normocephalic.  Eyes:     Extraocular Movements: Extraocular movements intact.     Pupils: Pupils are equal, round, and reactive to light.  Cardiovascular:     Rate and Rhythm: Normal rate and regular rhythm.     Heart sounds: Normal heart sounds.  Pulmonary:     Effort: Pulmonary effort is normal.     Breath sounds: Normal breath sounds.  Abdominal:     Palpations: Abdomen is soft.     Tenderness: There is no abdominal tenderness. There is no guarding.  Musculoskeletal:     Cervical back: Normal range of motion and neck supple. No rigidity or tenderness.     Comments: Moves all  extremities spontaneously without difficulty  Skin:    General: Skin is warm and dry.  Neurological:     General: No focal deficit present.     Mental Status: She is alert and oriented to person, place, and time.     (all labs ordered are listed, but only abnormal results are displayed) Labs Reviewed  BASIC METABOLIC PANEL WITH GFR - Abnormal; Notable for the following components:      Result Value   Creatinine, Ser 1.08 (*)    All other components within normal limits  CBC  LIPASE, BLOOD    EKG: None  Radiology: No results found.   Procedures   Medications Ordered in the ED  sodium chloride  0.9 % bolus 1,000 mL (0 mLs Intravenous  Stopped 12/11/23 2324)  metoCLOPramide  (REGLAN ) injection 10 mg (10 mg Intravenous Given 12/11/23 2223)  diphenhydrAMINE  (BENADRYL ) injection 25 mg (25 mg Intravenous Given 12/11/23 2223)  dexamethasone  (DECADRON ) injection 10 mg (10 mg Intravenous Given 12/11/23 2223)  HYDROmorphone  (DILAUDID ) injection 1 mg (1 mg Intravenous Given 12/11/23 2338)                                    Medical Decision Making This patient presents to the ED for concern of migraine, this involves an extensive number of treatment options, and is a complaint that carries with it a high risk of complications and morbidity.  The differential diagnosis includes migraine headache, tension headache, cluster headache, intracranial hemorrhage, meningitis   Co morbidities that complicate the patient evaluation  History of migraines, POTS   Additional history obtained:  Additional history obtained from record review External records from outside source obtained and reviewed including previous emergency department note   Lab Tests:  I Ordered, and personally interpreted labs.  The pertinent results include: CBC unremarkable.  CMP with mild elevation in creatinine of 1.08, this is largely stable from previous.  Lipase within normal limits.   Cardiac Monitoring: /  EKG:  The patient was maintained on a cardiac monitor.  I personally viewed and interpreted the cardiac monitored which showed an underlying rhythm of: NSR   Problem List / ED Course / Critical interventions / Medication management  I ordered medication including IV fluids/Decadron /Benadryl /Reglan  for migraine cocktail, Dilaudid  for continued pain Reevaluation of the patient after these medicines showed that the patient improved I have reviewed the patients home medicines and have made adjustments as needed   Social Determinants of Health:  Former tobacco use, depression   Test / Admission - Considered:  Physical exam largely unremarkable as above, no focal neurologic deficits.  Patient with history significant for recurrent migraines, followed by neurology.  At time of my reassessment after initial migraine cocktail, patient still endorsing 7/10 pain.  Reports that typically Dilaudid  works well for her as a component of her migraine cocktail, patient did subsequently have significant symptomatic improvement after administration of Dilaudid .  She has not had any additional episodes of vomiting since presenting to the emergency department this evening.  Patient is followed closely for her chronic migraines, is on monthly injectable medication that she is due to receive next week.  She also takes Nurtec as well as a tramadol .  I recommend that she follow-up with her primary care provider and neurologist as scheduled.  Return precautions discussed, she is in agreement with this plan and is appropriate discharge at this time.    Amount and/or Complexity of Data Reviewed Labs: ordered.  Risk Prescription drug management.        Final diagnoses:  Other migraine without status migrainosus, not intractable    ED Discharge Orders     None          Glendia Rocky SAILOR, PA-C 12/12/23 0000    Elnor Hila P, DO 12/24/23 1500

## 2023-12-12 ENCOUNTER — Ambulatory Visit: Attending: Family Medicine

## 2023-12-12 NOTE — Therapy (Incomplete)
 OUTPATIENT PHYSICAL THERAPY TREATMENT  Patient Name: Amber [name] MRN: [id number] DOB:1981-02-04, 43 y.o., female Today's Date: 12/12/2023  END OF SESSION:  PT End of Session - 11/16/23 1353     Visit Number 6    Number of Visits 17    Date for PT Re-Evaluation 01/04/24    Authorization Type [facility]    PT Start Time 1400    PT End Time 1440   Activity Tolerance Patient tolerated treatment well    Behavior During Therapy WFL for tasks assessed/performed               Past Medical History:  Diagnosis Date   Anxiety    Asthma    Depression    Diabetes (HCC)    GERD (gastroesophageal reflux disease)    History of chicken pox    History of migraine headaches    Migraines    Pituitary adenoma (HCC)    POTS (postural orthostatic tachycardia syndrome)    Seizures (HCC)    Past Surgical History:  Procedure Laterality Date   APPENDECTOMY     CHOLECYSTECTOMY     ESOPHAGEAL MANOMETRY N/A 07/12/2023   Procedure: MANOMETRY, ESOPHAGUS;  Surgeon: San Sandor GAILS, DO;  Location: [facility];  Service: Gastroenterology;  Laterality: N/A;   PH IMPEDANCE STUDY N/A 07/12/2023   Procedure: IMPEDANCE PH STUDY, ESOPHAGUS;  Surgeon: San Sandor GAILS, DO;  Location: [facility];  Service: Gastroenterology;  Laterality: N/A;   Pituiary Tumor     Tumor Removal Pituitary   Patient Active Problem List   Diagnosis Date Noted   EDS (Ehlers-Danlos syndrome) 09/20/2023   Dysautonomia (HCC) 09/20/2023   Bilateral hand swelling 07/25/2023   Palpitations 03/13/2023   Paroxysmal tachycardia (HCC) 03/13/2023   Mixed hyperlipidemia 02/14/2023   Iron  deficiency anemia 01/13/2023   Type 2 diabetes mellitus with hyperglycemia, without long-term current use of insulin  (HCC) 12/28/2022   Asthma 03/23/2021   Hypertriglyceridemia 11/10/2020   Myofascial pain 05/07/2020   Multiple closed fractures of ribs of right side 05/07/2020   Numbness of toes 05/07/2020   Prediabetes 04/28/2020    Pituitary microadenoma (HCC) 04/28/2020   Prolactinoma (HCC) 04/28/2020   Pre-diabetes 12/25/2019   Infertility counseling 12/24/2019   Well woman exam 12/24/2019   Hyperglycemia 06/05/2019   Insomnia 01/07/2019   Bilateral hand pain 11/09/2018   Agoraphobia 01/18/2018   Anxiety with depression 03/24/2017   Migraine 05/11/2016   Pituitary adenoma (HCC) 03/05/2016   Hyperprolactinemia 03/02/2016    PCP: Frann Mabel Mt DO  REFERRING PROVIDER: Joane Birmingham MD   REFERRING DIAG: 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  ONSET DATE: chronic 5+   SUBJECTIVE:  SUBJECTIVE STATEMENT: ***  EVAL: Pt reports being worked up for about 5 yrs due to hand issues but does recall subluxing hips as a child.  Primary MD gave her some exercises. She has difficulty with stability and balance during functional activities at home.  Since losing wgt (95 lbs) she has had more joint pain.  Seeing Neuro soon for workup for tingling in feet and hands.  She has difficulty standing and walking (avoids shopping), activity in general.  She does walk the dogs every other day (1/2 mile).  Someday she can't make it the full time.  She has difficulty with lifting, hand function and gripping.  Compression gloves at night. She has cubital tunnel elbow braces but she does not wear them much.  She did OT for only 1 visit but did not return, was recommended to follow up about the hypermobility in her hands at that time.   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 about 1.5 yrs    Has the patient been formally diagnosed with EDS or HSD? If yes indicate the type EDS-type III recent diagnosis    If no ,does the  patient feel they may have EDS or HSD?  Patient reports symptoms and/or instability in the following areas:  - Cervical Spine: [x] Pain [] Instability [] Limited ROM [] Paresthesia  - Thoracic Spine: [] Pain [] Instability  - Lumbar Spine: [x] Pain [] Instability [] Frequent locking/giving out  - Shoulder: [] L [x] R  [x] Subluxation [] Dislocations [x] Pain [] Fatigue  - Elbow: [] L [] R  [] Instability [] Hyperextension [] Pain  - Wrist/Hand: [x] L [x] R  [x] Instability [] Fatigue with use [x] Pain  - Hip: [x] L [x] R  [x] Instability [x] Pain [x] Clicking [] Gait deviations  - Knee: [x] L [x] R  [x] Instability [x] Hyperextension [x] Pain  - Ankle/Foot: [x] L [x] R  [x] Instability [] Frequent sprains Pain , Lt. 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, husband is disabled and she helps him with more executive functioning activities ____________________________   Patient-reported Beighton Score (if known): _____8__/9  Clinician-assessed Beighton Score (today): _____8__/9     PERTINENT HISTORY:  MD note:    MS: bilat hip pain w/ sublux in middle school, bilat hand pain and swelling, bilat shoulder instability R>L Skin/Immune reactions: stretchy skin, chronic urticaria Nervous system: dizziness esp w/ transitioning positions Head/Spine: chronic migraines, insomnia CV: tachycardia when transitioning to stand GI: GERD, chronic diarrhea, intermittent constipation , Genitourinary: none Hands & Feet: swell, paresthesia bilat hands and feet   PAIN:  Are you having pain?  Yes: NPRS scale: 7/10 Pain location: hips lateral and groin Pain description: sore and achy  Aggravating factors: sitting too  long Relieving factors: pretzel, Tylenol , heating pad  Are you having pain?  Yes: NPRS scale: 5/10  Pain location: Rt shoulder  Pain description: moving around a lot  Aggravating factors: overuse , handling dog  Relieving factors: heating pad    PRECAUTIONS: None  RED FLAGS: Monitor syncope   WEIGHT BEARING RESTRICTIONS: No  FALLS:  Has patient fallen in last 6 months? 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  DIAGNOSTIC FINDINGS:  none  CARDIO/ORTHOSTATICS: Baseline RHR 99 Standing HR 125  Baseline BP 90/62 Standing BP 101/53 min dizziness    PATIENT SURVEYS:  Quick Dash:  QUICK DASH  Please rate your ability do the following activities in the last week by selecting the number below the appropriate response.   Activities Rating  Open a tight or new jar.  3 = Moderate difficulty  Do heavy household chores (e.g., wash walls, floors). 3 = Moderate difficulty  Carry a shopping bag or briefcase 4 = Severe difficulty  Wash your back. 2 = Mild difficulty  Use a knife to cut food. 2 = Mild difficulty  Recreational activities in which you take some force or impact through your arm, shoulder or hand (e.g., golf, hammering, tennis, etc.). 3 = Moderate difficulty  During the past week, to what extent has your arm, shoulder or hand problem interfered with your normal social activities with family,  friends, neighbors or groups?  3 = Moderately  During the past week, were you limited in your work or other regular daily activities as a result of your arm, shoulder or hand problem? 3 = Moderately limited  During the past week, were you limited in your work or other regular daily activities as a result of your arm, shoulder or hand problem? 3 = Moderate  Tingling (pins and needles) in your arm, shoulder or hand. 3 = Moderate  During the past week, how much difficulty have you had sleeping because of the pain in your arm, shoulder or hand?  3 = Moderate difficulty   (A QuickDASH score may not be calculated if there is greater than 1 missing item.)  Quick Dash Disability/Symptom Score: [(sum of 32 (n) responses/11 (n)] x 25 = 47% able   Minimally Clinically Important Difference (MCID): 15-20 points  (Franchignoni, F. et al. (2013). Minimally clinically important difference of the disabilities of the arm, shoulder, and hand outcome measures (DASH) and its shortened version (Quick DASH). Journal of Orthopaedic & Sports Physical Therapy, 44(1), 30-39)   COGNITION: Overall cognitive status: Within functional limits for tasks assessed     SENSATION: Feet tingling at frequently , some N/T in L elbow from surgery    POSTURE: rounded shoulders, forward head, increased thoracic kyphosis, and posterior pelvic tilt  PALPATION: Sore to palpation Rt shoulder grossly  Skin is extensible and very smooth soft    LUMBAR ROM:   AROM eval  Flexion Palms flat   Extension   Right lateral flexion   Left lateral flexion   Right rotation   Left rotation    (Blank rows = not tested)  LOWER EXTREMITY ROM:  PROM WNL, hypermobile IR >ER   LOWER EXTREMITY MMT:    MMT Right eval Left eval  Hip flexion 4+ 4+  Hip extension    Hip abduction    Hip adduction    Hip internal rotation    Hip external rotation    Knee flexion 5 5  Knee extension 5 5  Ankle dorsiflexion    Ankle plantarflexion     Ankle inversion    Ankle eversion     (Blank rows = not tested)  LUMBAR SPECIAL TESTS:  NT   CERVICAL ROM:  NT on eval  Active ROM A/PROM (deg) 12/12/2023  Flexion   Extension   Right lateral flexion   Left lateral flexion   Right rotation   Left rotation    (Blank rows = not tested)  UE ROM: WNL  Pain limits Rt shoulder combo ER/ and abduction  UE MMT:  MMT Right eval Left eval  Shoulder flexion 4-pain  4  Shoulder extension    Shoulder abduction 4-pain  4  Shoulder adduction    Shoulder extension    Shoulder internal rotation 4 pain  4+  Shoulder external rotation 4 pain  4+  Middle trapezius    Lower trapezius    Elbow flexion 4 4+  Elbow extension 4 4+  Wrist flexion    Wrist extension    Wrist ulnar deviation    Wrist radial deviation    Wrist pronation    Wrist supination    Grip strength     (Blank rows = not tested)  CERVICAL SPECIAL TESTS:  NT   FUNCTIONAL TESTS:  5 times sit to stand: 18.2 sec   30 seconds chair stand test 9 reps  2WMT: 340ft HR -     Beighton Scale Lumbar (_1/1) Knees (_1/2) Elbows (_2/2)  5th digit (_2/2) Thumb (_2/2)  8/9: knee hyper extension  5 deg on Rt and 10 on Lt   TREATMENT: [facility] Adult PT Treatment:                                                DATE: 12/12/2023 Assessment of tests/measures, goals, and outcomes Hook-lying PPT x 10 - 5 hold PPT into bridge 2x15 with pilates ring Pilates SLR 2x10 ea Supine R shoulder flex 2x10 3# Standing row 3x12 blue band Standing ER 2x10 YTB Shoulder ext with scap retraction 2x15 GTB  [facility] Adult PT Treatment:                                                DATE: 11/16/2023 Assessment of tests/measures, goals, and outcomes Hook-lying PPT x 10 - 5 hold PPT into bridge 2x15 with pilates ring Pilates SLR 2x10 ea Supine R shoulder flex 2x10 3# Standing row 3x12 blue band Standing ER 2x10 YTB Shoulder ext with scap retraction 2x15 GTB  [facility] Adult PT Treatment:                                                 DATE: 11/14/2023 NuStep lvl 5 UE/LE x 4 min for activity tolerance while monitoring HR  Hook-lying PPT x 10 - 5 hold Hook-lying PPT with pilates add 2x10 PPT into bridge 2x10 with pilates ring Pilates SLR 2x10 ea S/L hip abd 2x10 ea Seated bilateral ER 2x10 RTB R shoulder flexion 2x10 3#  [facility] Adult PT Treatment:  DATE: 11/09/2023 NuStep lvl 4 UE/LE x 4 min for activity tolerance while monitoring HR  Supine PPT x 10 - 5 hold PPT into bridge 2x10 with pilates ring Pilates SLR 2x10 ea PPT with shoulder pulldown 3x10 BTB Pilates ring horizontal abd 2x10 with elevation to 90 deg ER isometric walkout x 10 YTB ea shoulder Standing row 2x12 blue band  [facility] Adult PT Treatment:                                                DATE: 10/31/2023 NuStep lvl 3 LE only x 4 min for activity tolerance while monitoring HR Supine PPT x 10 - 5 hold Supine PPT with ball 2x10 - 5 hold Pilates SLR 2x10 ea PPT into bridge 2x10 with pilates ring PPT with shoulder pulldown 2x10 GTB Supine R shoulder flexion 2x10 2# S/L R shoulder ER 2x10 2# Seated horizontal abd 2x10 RTB  [facility] Adult PT Treatment:                                                DATE: 10/12/2023 - 342ft Supine PPT x 10 - 5 hold PPT into bridge 2x10 S/L clamshell 2x10 RTB Seated scapular retraction x 10 - 5 hold R shoulder ER isometric x 10 - 5 hold Seated bilateral ER RTB 3x10  PATIENT EDUCATION:  Education details:  Posture, alignment, neutral zone and joint protection PNE/Explain pain    Joint inflammation/collagen/ligament laxity, muscular support Stretching vs stabilizing  Person educated: Patient Education method: Explanation Education comprehension: verbalized understanding   HOME EXERCISE PROGRAM: Access Code: [email or web address] URL: [email or web address] Date: 11/16/2023 Prepared by: Alm Kingdom  Exercises - Supine  Posterior Pelvic Tilt  - 1 x daily - 7 x weekly - 2 sets - 10 reps - 5 sec hold - Supine Bridge  - 1 x daily - 7 x weekly - 2 sets - 10 reps - Clamshell with Resistance  - 1 x daily - 7 x weekly - 2 sets - 15 reps - red band hold - Seated Scapular Retraction  - 1 x daily - 7 x weekly - 2 sets - 10 reps - 3-5 sec hold - Standing Isometric Shoulder External Rotation with Doorway and Towel Roll  - 1 x daily - 7 x weekly - 2 sets - 10 reps - 3-5 sec hold - Shoulder External Rotation and Scapular Retraction with Resistance  - 1 x daily - 7 x weekly - 3 sets - 10 reps - red band hold - Supine Shoulder Flexion Extension Full Range AROM  - 1 x daily - 7 x weekly - 2 sets - 10 reps - 2lbs hold - Shoulder External Rotation with Anchored Resistance  - 1 x daily - 7 x weekly - 2 sets - 10 reps - yellow band hold  ASSESSMENT:  CLINICAL IMPRESSION: *** Pt was able to complete all prescribed exercises with no adverse effect. Today we continued to progressed hip/core strengthening exercises with emphasis on neutral spine as well as improving R shoulder dynamic stability. Her HR remained steady steady today with no autonomic symptoms noted. slightly improved. She continues to benefit from skilled PT services, will continue to progress as able.   OBJECTIVE IMPAIRMENTS:  cardiopulmonary status limiting activity, decreased activity tolerance, decreased coordination, decreased endurance, decreased mobility, difficulty walking, decreased ROM, decreased strength, impaired UE functional use, postural dysfunction, obesity, and pain.   ACTIVITY LIMITATIONS: carrying, lifting, bending, sitting, standing, squatting, sleeping, stairs, transfers, reach over head, locomotion level, and caring for others  PARTICIPATION LIMITATIONS: meal prep, cleaning, laundry, interpersonal relationship, shopping, and community activity  PERSONAL FACTORS: Past/current experiences, Time since onset of injury/illness/exacerbation, and 3+  comorbidities: dysautonomia/syncope, MCAS, L elbow post surgery  are also affecting patient's functional outcome.   REHAB POTENTIAL: Excellent  CLINICAL DECISION MAKING: Evolving/moderate complexity  EVALUATION COMPLEXITY: Moderate   GOALS: Goals reviewed with patient? Yes  SHORT TERM GOALS: Target date: 11/01/2023   Patient will be able to show independence for initial HEP to include posture, core and hip strength and stability.   Baseline: Goal status: MET  2.    Patient will be able to complete functional testing for endurance, balance and strength. Baseline:  Goal status:MET  3.  Pt will report improved awareness of core and posture when performing ADLs  Baseline:  Goal status: IN PROGRESS   LONG TERM GOALS: Target date: 01/04/2024  Patient will be independent with final HEP upon discharge from PT and report consistent benefit following exercise completion.    Baseline:  Goal status: INITIAL  2.  Pt will improve 2 min walk test distance by 50 feet or more with LRAD and no increase in pain.   Baseline: 355 11/16/23: 322ft Goal status: IN PROGRESS  3.  Patient will be I with concepts of joint protection and stability as it pertains to joint hypermobility. Baseline:  Goal status: INITIAL  4.  Pt will be able to demonstrate good standing balance as evidenced by balance recovery from min to mod dynamic balance challenges.   Baseline:  Goal status: INITIAL  5.  Pt will be able to consistently walk dogs 1/2 mile without undue fatigue and pain < 2/10. Baseline:  Goal status: INITIAL    PLAN:  PT FREQUENCY: 2x/week  PT DURATION: 8 weeks  PLANNED INTERVENTIONS: 97164- PT Re-evaluation, 97750- Physical Performance Testing, 97110-Therapeutic exercises, 97530- Therapeutic activity, [id number]- Neuromuscular re-education, 97535- Self Care, [id number]- Manual therapy, 20560 (1-2 muscles), 20561 (3+ muscles)- Dry Needling, Patient/Family education, Balance training, Taping,  Cryotherapy, and Moist heat.  PLAN FOR NEXT SESSION: establish HEP and complete 2 min walk, check BP.HR . Focus on stability training and core control    Alm JAYSON Kingdom, PT 12/12/2023, 7:56 AM

## 2023-12-12 NOTE — ED Notes (Signed)
D/c paperwork reviewed with pt, including follow up care.  No questions or concerns voiced at time of d/c. . Pt verbalized understanding, Ambulatory without assistance to ED exit, NAD.   

## 2023-12-14 ENCOUNTER — Ambulatory Visit

## 2023-12-14 ENCOUNTER — Ambulatory Visit (INDEPENDENT_AMBULATORY_CARE_PROVIDER_SITE_OTHER): Admitting: Neurology

## 2023-12-14 DIAGNOSIS — R202 Paresthesia of skin: Secondary | ICD-10-CM

## 2023-12-14 NOTE — Procedures (Signed)
 [facility]  [address] [place], [address]  [place], [place] [address] Tel: [phone] Fax: [phone] Test Date:  12/14/2023  Patient: Amber [name] DOB: Jan 12, 1981 Physician: Tonita Blanch, DO  Sex: Female Height: 5' 6 Ref Phys: Juliene Dunnings, DO  ID#: [id number]   Technician:    History: This is a 43 year old female referred for evaluation of bilateral feet paresthesias.  NCV & EMG Findings: Electrodiagnostic testing of the right lower extremity and additional studies of the left shows: Bilateral sural and superficial peroneal sensory responses are within normal limits. Bilateral peroneal and tibial motor responses are within normal limits. Bilateral tibial H reflex studies are within normal limits. There is no evidence of active or chronic motor axonal changes affecting any of the tested muscles.  Motor unit configuration and recruitment pattern is within normal limits.  Impression: This is a normal study of the lower extremities.  In particular, there is no evidence of a large fiber sensorimotor polyneuropathy or lumbosacral radiculopathy.   ___________________________ Tonita Blanch, DO    Nerve Conduction Studies   Stim Site NR Peak (ms) Norm Peak (ms) O-P Amp (V) Norm O-P Amp  Left Sup Peroneal Anti Sensory (Ant Lat Mall)  32 C  12 cm    2.3 <4.5 7.4 >5  Right Sup Peroneal Anti Sensory (Ant Lat Mall)  32 C  12 cm    2.2 <4.5 8.8 >5  Left Sural Anti Sensory (Lat Mall)  32 C  Calf    2.6 <4.5 6.8 >5  Right Sural Anti Sensory (Lat Mall)  32 C  Calf    2.8 <4.5 7.2 >5     Stim Site NR Onset (ms) Norm Onset (ms) O-P Amp (mV) Norm O-P Amp Site1 Site2 Delta-0 (ms) Dist (cm) Vel (m/s) Norm Vel (m/s)  Left Peroneal Motor (Ext Dig Brev)  32 C  Ankle    3.7 <5.5 3.7 >3 B Fib Ankle 7.6 36.0 47 >40  B Fib    11.3  3.4  Poplt B Fib 1.4 8.0 57 >40  Poplt    12.7  3.3         Right Peroneal Motor (Ext Dig Brev)  32 C  Ankle    3.4 <5.5 5.4 >3 B Fib Ankle  7.4 37.0 50 >40  B Fib    10.8  5.1  Poplt B Fib 1.5 8.0 53 >40  Poplt    12.3  5.0         Left Tibial Motor (Abd Hall Brev)  32 C  Ankle    4.8 <6.0 13.8 >8 Knee Ankle 7.7 42.0 55 >40  Knee    12.5  10.8         Right Tibial Motor (Abd Hall Brev)  32 C  Ankle    3.1 <6.0 12.3 >8 Knee Ankle 8.6 42.0 49 >40  Knee    11.7  11.7          Electromyography   Side Muscle Ins.Act Fibs Fasc Recrt Amp Dur Poly Activation Comment  Right AntTibialis Nml Nml Nml Nml Nml Nml Nml Nml N/A  Right Gastroc Nml Nml Nml Nml Nml Nml Nml Nml N/A  Right Flex Dig Long Nml Nml Nml Nml Nml Nml Nml Nml N/A  Right RectFemoris Nml Nml Nml Nml Nml Nml Nml Nml N/A  Right GluteusMed Nml Nml Nml Nml Nml Nml Nml Nml N/A  Left AntTibialis Nml Nml Nml Nml Nml Nml Nml Nml N/A  Left Gastroc Nml  Nml Nml Nml Nml Nml Nml Nml N/A  Left Flex Dig Long Nml Nml Nml Nml Nml Nml Nml Nml N/A  Left RectFemoris Nml Nml Nml Nml Nml Nml Nml Nml N/A  Left GluteusMed Nml Nml Nml Nml Nml Nml Nml Nml N/A      Waveforms:

## 2023-12-14 NOTE — Therapy (Incomplete)
 OUTPATIENT PHYSICAL THERAPY TREATMENT  Patient Name: Amber [name] MRN: [id number] DOB:23-Jul-1980, 43 y.o., female Today's Date: 12/14/2023  END OF SESSION:  PT End of Session - 11/16/23 1353     Visit Number 6    Number of Visits 17    Date for PT Re-Evaluation 01/04/24    Authorization Type [facility] [place]    PT Start Time 1400    PT End Time 1440   Activity Tolerance Patient tolerated treatment well    Behavior During Therapy WFL for tasks assessed/performed               Past Medical History:  Diagnosis Date   Anxiety    Asthma    Depression    Diabetes (HCC)    GERD (gastroesophageal reflux disease)    History of chicken pox    History of migraine headaches    Migraines    Pituitary adenoma (HCC)    POTS (postural orthostatic tachycardia syndrome)    Seizures (HCC)    Past Surgical History:  Procedure Laterality Date   APPENDECTOMY     CHOLECYSTECTOMY     ESOPHAGEAL MANOMETRY N/A 07/12/2023   Procedure: MANOMETRY, ESOPHAGUS;  Surgeon: San Sandor GAILS, DO;  Location: [facility];  Service: Gastroenterology;  Laterality: N/A;   PH IMPEDANCE STUDY N/A 07/12/2023   Procedure: IMPEDANCE PH STUDY, ESOPHAGUS;  Surgeon: San Sandor GAILS, DO;  Location: [facility];  Service: Gastroenterology;  Laterality: N/A;   Pituiary Tumor     Tumor Removal Pituitary   Patient Active Problem List   Diagnosis Date Noted   EDS (Ehlers-Danlos syndrome) 09/20/2023   Dysautonomia (HCC) 09/20/2023   Bilateral hand swelling 07/25/2023   Palpitations 03/13/2023   Paroxysmal tachycardia (HCC) 03/13/2023   Mixed hyperlipidemia 02/14/2023   Iron  deficiency anemia 01/13/2023   Type 2 diabetes mellitus with hyperglycemia, without long-term current use of insulin  (HCC) 12/28/2022   Asthma 03/23/2021   Hypertriglyceridemia 11/10/2020   Myofascial pain 05/07/2020   Multiple closed fractures of ribs of right side 05/07/2020   Numbness of toes 05/07/2020   Prediabetes 04/28/2020    Pituitary microadenoma (HCC) 04/28/2020   Prolactinoma (HCC) 04/28/2020   Pre-diabetes 12/25/2019   Infertility counseling 12/24/2019   Well woman exam 12/24/2019   Hyperglycemia 06/05/2019   Insomnia 01/07/2019   Bilateral hand pain 11/09/2018   Agoraphobia 01/18/2018   Anxiety with depression 03/24/2017   Migraine 05/11/2016   Pituitary adenoma (HCC) 03/05/2016   Hyperprolactinemia 03/02/2016    PCP: Frann Mabel Mt DO  REFERRING PROVIDER: Joane Birmingham MD   REFERRING DIAG: 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  ONSET DATE: chronic 5+   SUBJECTIVE:  SUBJECTIVE STATEMENT: ***  EVAL: Pt reports being worked up for about 5 yrs due to hand issues but does recall subluxing hips as a child.  Primary MD gave her some exercises. She has difficulty with stability and balance during functional activities at home.  Since losing wgt (95 lbs) she has had more joint pain.  Seeing Neuro soon for workup for tingling in feet and hands.  She has difficulty standing and walking (avoids shopping), activity in general.  She does walk the dogs every other day (1/2 mile).  Someday she can't make it the full time.  She has difficulty with lifting, hand function and gripping.  Compression gloves at night. She has cubital tunnel elbow braces but she does not wear them much.  She did OT for only 1 visit but did not return, was recommended to follow up about the hypermobility in her hands at that time.   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 about 1.5 yrs    Has the patient been formally diagnosed with EDS or HSD? If yes indicate the type EDS-type III recent diagnosis    If no ,does the  patient feel they may have EDS or HSD?  Patient reports symptoms and/or instability in the following areas:  - Cervical Spine: [x] Pain [] Instability [] Limited ROM [] Paresthesia  - Thoracic Spine: [] Pain [] Instability  - Lumbar Spine: [x] Pain [] Instability [] Frequent locking/giving out  - Shoulder: [] L [x] R  [x] Subluxation [] Dislocations [x] Pain [] Fatigue  - Elbow: [] L [] R  [] Instability [] Hyperextension [] Pain  - Wrist/Hand: [x] L [x] R  [x] Instability [] Fatigue with use [x] Pain  - Hip: [x] L [x] R  [x] Instability [x] Pain [x] Clicking [] Gait deviations  - Knee: [x] L [x] R  [x] Instability [x] Hyperextension [x] Pain  - Ankle/Foot: [x] L [x] R  [x] Instability [] Frequent sprains Pain , Lt. 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, husband is disabled and she helps him with more executive functioning activities ____________________________   Patient-reported Beighton Score (if known): _____8__/9  Clinician-assessed Beighton Score (today): _____8__/9     PERTINENT HISTORY:  MD note:    MS: bilat hip pain w/ sublux in middle school, bilat hand pain and swelling, bilat shoulder instability R>L Skin/Immune reactions: stretchy skin, chronic urticaria Nervous system: dizziness esp w/ transitioning positions Head/Spine: chronic migraines, insomnia CV: tachycardia when transitioning to stand GI: GERD, chronic diarrhea, intermittent constipation , Genitourinary: none Hands & Feet: swell, paresthesia bilat hands and feet   PAIN:  Are you having pain?  Yes: NPRS scale: 7/10 Pain location: hips lateral and groin Pain description: sore and achy  Aggravating factors: sitting too  long Relieving factors: pretzel, Tylenol , heating pad  Are you having pain?  Yes: NPRS scale: 5/10  Pain location: Rt shoulder  Pain description: moving around a lot  Aggravating factors: overuse , handling dog  Relieving factors: heating pad    PRECAUTIONS: None  RED FLAGS: Monitor syncope   WEIGHT BEARING RESTRICTIONS: No  FALLS:  Has patient fallen in last 6 months? 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  DIAGNOSTIC FINDINGS:  none  CARDIO/ORTHOSTATICS: Baseline RHR 99 Standing HR 125  Baseline BP 90/62 Standing BP 101/53 min dizziness    PATIENT SURVEYS:  Quick Dash:  QUICK DASH  Please rate your ability do the following activities in the last week by selecting the number below the appropriate response.   Activities Rating  Open a tight or new jar.  3 = Moderate difficulty  Do heavy household chores (e.g., wash walls, floors). 3 = Moderate difficulty  Carry a shopping bag or briefcase 4 = Severe difficulty  Wash your back. 2 = Mild difficulty  Use a knife to cut food. 2 = Mild difficulty  Recreational activities in which you take some force or impact through your arm, shoulder or hand (e.g., golf, hammering, tennis, etc.). 3 = Moderate difficulty  During the past week, to what extent has your arm, shoulder or hand problem interfered with your normal social activities with family,  friends, neighbors or groups?  3 = Moderately  During the past week, were you limited in your work or other regular daily activities as a result of your arm, shoulder or hand problem? 3 = Moderately limited  During the past week, were you limited in your work or other regular daily activities as a result of your arm, shoulder or hand problem? 3 = Moderate  Tingling (pins and needles) in your arm, shoulder or hand. 3 = Moderate  During the past week, how much difficulty have you had sleeping because of the pain in your arm, shoulder or hand?  3 = Moderate difficulty   (A QuickDASH score may not be calculated if there is greater than 1 missing item.)  Quick Dash Disability/Symptom Score: [(sum of 32 (n) responses/11 (n)] x 25 = 47% able   Minimally Clinically Important Difference (MCID): 15-20 points  (Franchignoni, F. et al. (2013). Minimally clinically important difference of the disabilities of the arm, shoulder, and hand outcome measures (DASH) and its shortened version (Quick DASH). Journal of Orthopaedic & Sports Physical Therapy, 44(1), 30-39)   COGNITION: Overall cognitive status: Within functional limits for tasks assessed     SENSATION: Feet tingling at frequently , some N/T in L elbow from surgery    POSTURE: rounded shoulders, forward head, increased thoracic kyphosis, and posterior pelvic tilt  PALPATION: Sore to palpation Rt shoulder grossly  Skin is extensible and very smooth soft    LUMBAR ROM:   AROM eval  Flexion Palms flat   Extension   Right lateral flexion   Left lateral flexion   Right rotation   Left rotation    (Blank rows = not tested)  LOWER EXTREMITY ROM:  PROM WNL, hypermobile IR >ER   LOWER EXTREMITY MMT:    MMT Right eval Left eval  Hip flexion 4+ 4+  Hip extension    Hip abduction    Hip adduction    Hip internal rotation    Hip external rotation    Knee flexion 5 5  Knee extension 5 5  Ankle dorsiflexion    Ankle plantarflexion     Ankle inversion    Ankle eversion     (Blank rows = not tested)  LUMBAR SPECIAL TESTS:  NT   CERVICAL ROM:  NT on eval  Active ROM A/PROM (deg) 12/14/2023  Flexion   Extension   Right lateral flexion   Left lateral flexion   Right rotation   Left rotation    (Blank rows = not tested)  UE ROM: WNL  Pain limits Rt shoulder combo ER/ and abduction  UE MMT:  MMT Right eval Left eval  Shoulder flexion 4-pain  4  Shoulder extension    Shoulder abduction 4-pain  4  Shoulder adduction    Shoulder extension    Shoulder internal rotation 4 pain  4+  Shoulder external rotation 4 pain  4+  Middle trapezius    Lower trapezius    Elbow flexion 4 4+  Elbow extension 4 4+  Wrist flexion    Wrist extension    Wrist ulnar deviation    Wrist radial deviation    Wrist pronation    Wrist supination    Grip strength     (Blank rows = not tested)  CERVICAL SPECIAL TESTS:  NT   FUNCTIONAL TESTS:  5 times sit to stand: 18.2 sec   30 seconds chair stand test 9 reps  2WMT: 360ft HR -     Beighton Scale Lumbar (_1/1) Knees (_1/2) Elbows (_2/2)  5th digit (_2/2) Thumb (_2/2)  8/9: knee hyper extension  5 deg on Rt and 10 on Lt   TREATMENT: [facility] Adult PT Treatment:                                                DATE: 12/14/2023 Assessment of tests/measures, goals, and outcomes Hook-lying PPT x 10 - 5 hold PPT into bridge 2x15 with pilates ring Pilates SLR 2x10 ea Supine R shoulder flex 2x10 3# Standing row 3x12 blue band Standing ER 2x10 YTB Shoulder ext with scap retraction 2x15 GTB  [facility] Adult PT Treatment:                                                DATE: 11/16/2023 Assessment of tests/measures, goals, and outcomes Hook-lying PPT x 10 - 5 hold PPT into bridge 2x15 with pilates ring Pilates SLR 2x10 ea Supine R shoulder flex 2x10 3# Standing row 3x12 blue band Standing ER 2x10 YTB Shoulder ext with scap retraction 2x15 GTB  [facility] Adult PT Treatment:                                                 DATE: 11/14/2023 NuStep lvl 5 UE/LE x 4 min for activity tolerance while monitoring HR  Hook-lying PPT x 10 - 5 hold Hook-lying PPT with pilates add 2x10 PPT into bridge 2x10 with pilates ring Pilates SLR 2x10 ea S/L hip abd 2x10 ea Seated bilateral ER 2x10 RTB R shoulder flexion 2x10 3#  [facility] Adult PT Treatment:  DATE: 11/09/2023 NuStep lvl 4 UE/LE x 4 min for activity tolerance while monitoring HR  Supine PPT x 10 - 5 hold PPT into bridge 2x10 with pilates ring Pilates SLR 2x10 ea PPT with shoulder pulldown 3x10 BTB Pilates ring horizontal abd 2x10 with elevation to 90 deg ER isometric walkout x 10 YTB ea shoulder Standing row 2x12 blue band  [facility] Adult PT Treatment:                                                DATE: 10/31/2023 NuStep lvl 3 LE only x 4 min for activity tolerance while monitoring HR Supine PPT x 10 - 5 hold Supine PPT with ball 2x10 - 5 hold Pilates SLR 2x10 ea PPT into bridge 2x10 with pilates ring PPT with shoulder pulldown 2x10 GTB Supine R shoulder flexion 2x10 2# S/L R shoulder ER 2x10 2# Seated horizontal abd 2x10 RTB  [facility] Adult PT Treatment:                                                DATE: 10/12/2023 - 32ft Supine PPT x 10 - 5 hold PPT into bridge 2x10 S/L clamshell 2x10 RTB Seated scapular retraction x 10 - 5 hold R shoulder ER isometric x 10 - 5 hold Seated bilateral ER RTB 3x10  PATIENT EDUCATION:  Education details:  Posture, alignment, neutral zone and joint protection PNE/Explain pain    Joint inflammation/collagen/ligament laxity, muscular support Stretching vs stabilizing  Person educated: Patient Education method: Explanation Education comprehension: verbalized understanding   HOME EXERCISE PROGRAM: Access Code: [email or web address] URL: [email or web address] Date: 11/16/2023 Prepared by: Alm Kingdom  Exercises - Supine  Posterior Pelvic Tilt  - 1 x daily - 7 x weekly - 2 sets - 10 reps - 5 sec hold - Supine Bridge  - 1 x daily - 7 x weekly - 2 sets - 10 reps - Clamshell with Resistance  - 1 x daily - 7 x weekly - 2 sets - 15 reps - red band hold - Seated Scapular Retraction  - 1 x daily - 7 x weekly - 2 sets - 10 reps - 3-5 sec hold - Standing Isometric Shoulder External Rotation with Doorway and Towel Roll  - 1 x daily - 7 x weekly - 2 sets - 10 reps - 3-5 sec hold - Shoulder External Rotation and Scapular Retraction with Resistance  - 1 x daily - 7 x weekly - 3 sets - 10 reps - red band hold - Supine Shoulder Flexion Extension Full Range AROM  - 1 x daily - 7 x weekly - 2 sets - 10 reps - 2lbs hold - Shoulder External Rotation with Anchored Resistance  - 1 x daily - 7 x weekly - 2 sets - 10 reps - yellow band hold  ASSESSMENT:  CLINICAL IMPRESSION: *** Pt was able to complete all prescribed exercises with no adverse effect. Today we continued to progressed hip/core strengthening exercises with emphasis on neutral spine as well as improving R shoulder dynamic stability. Her HR remained steady steady today with no autonomic symptoms noted. slightly improved. She continues to benefit from skilled PT services, will continue to progress as able.   OBJECTIVE IMPAIRMENTS:  cardiopulmonary status limiting activity, decreased activity tolerance, decreased coordination, decreased endurance, decreased mobility, difficulty walking, decreased ROM, decreased strength, impaired UE functional use, postural dysfunction, obesity, and pain.   ACTIVITY LIMITATIONS: carrying, lifting, bending, sitting, standing, squatting, sleeping, stairs, transfers, reach over head, locomotion level, and caring for others  PARTICIPATION LIMITATIONS: meal prep, cleaning, laundry, interpersonal relationship, shopping, and community activity  PERSONAL FACTORS: Past/current experiences, Time since onset of injury/illness/exacerbation, and 3+  comorbidities: dysautonomia/syncope, MCAS, L elbow post surgery  are also affecting patient's functional outcome.   REHAB POTENTIAL: Excellent  CLINICAL DECISION MAKING: Evolving/moderate complexity  EVALUATION COMPLEXITY: Moderate   GOALS: Goals reviewed with patient? Yes  SHORT TERM GOALS: Target date: 11/01/2023   Patient will be able to show independence for initial HEP to include posture, core and hip strength and stability.   Baseline: Goal status: MET  2.    Patient will be able to complete functional testing for endurance, balance and strength. Baseline:  Goal status:MET  3.  Pt will report improved awareness of core and posture when performing ADLs  Baseline:  Goal status: IN PROGRESS   LONG TERM GOALS: Target date: 01/04/2024  Patient will be independent with final HEP upon discharge from PT and report consistent benefit following exercise completion.    Baseline:  Goal status: INITIAL  2.  Pt will improve 2 min walk test distance by 50 feet or more with LRAD and no increase in pain.   Baseline: 355 11/16/23: 37ft Goal status: IN PROGRESS  3.  Patient will be I with concepts of joint protection and stability as it pertains to joint hypermobility. Baseline:  Goal status: INITIAL  4.  Pt will be able to demonstrate good standing balance as evidenced by balance recovery from min to mod dynamic balance challenges.   Baseline:  Goal status: INITIAL  5.  Pt will be able to consistently walk dogs 1/2 mile without undue fatigue and pain < 2/10. Baseline:  Goal status: INITIAL    PLAN:  PT FREQUENCY: 2x/week  PT DURATION: 8 weeks  PLANNED INTERVENTIONS: 97164- PT Re-evaluation, 97750- Physical Performance Testing, 97110-Therapeutic exercises, 97530- Therapeutic activity, [id number]- Neuromuscular re-education, 97535- Self Care, [id number]- Manual therapy, 20560 (1-2 muscles), 20561 (3+ muscles)- Dry Needling, Patient/Family education, Balance training, Taping,  Cryotherapy, and Moist heat.  PLAN FOR NEXT SESSION: establish HEP and complete 2 min walk, check BP.HR . Focus on stability training and core control    Alm JAYSON Kingdom, PT 12/14/2023, 8:23 AM

## 2023-12-15 NOTE — Progress Notes (Signed)
 Unable to reach patient. Phone not set up

## 2023-12-19 ENCOUNTER — Ambulatory Visit: Admitting: Neurology

## 2023-12-20 ENCOUNTER — Ambulatory Visit: Admitting: Family Medicine

## 2023-12-20 ENCOUNTER — Other Ambulatory Visit: Payer: Self-pay

## 2023-12-20 VITALS — BP 140/68 | HR 93 | Ht 66.0 in | Wt 189.0 lb

## 2023-12-20 DIAGNOSIS — G901 Familial dysautonomia [Riley-Day]: Secondary | ICD-10-CM | POA: Diagnosis not present

## 2023-12-20 DIAGNOSIS — Q796 Ehlers-Danlos syndrome, unspecified: Secondary | ICD-10-CM

## 2023-12-20 DIAGNOSIS — E1165 Type 2 diabetes mellitus with hyperglycemia: Secondary | ICD-10-CM

## 2023-12-20 NOTE — Progress Notes (Unsigned)
   [name], PhD, LAT, ATC acting as a scribe for Artist Lloyd, MD.  Amber [name] is a 43 y.o. female who presents to [facility] at [facility] today for 34-month f/u EDS, POTS, and MCAS. Pt was last seen by Dr. Lloyd on 09/19/23 and was advised to f/u w/ cardiology and was referred to PT, completing 6 visits. Cardiology prescribed diltiazem  120mg .   Today, pt reports she had a POTS flare and didn't want to leave her house, missing 2 PT visits. She has been able to r/s. She was seen at the ED on 9/22 w/ a migraine that she couldn't resolve w/ her rx. Been working on [employer]. She c/o overall soreness, back pain. She stopped the diltiazem  do to side-effects.  Pertinent review of systems: No fevers or chills  Relevant historical information: Diabetes, Ehlers-Danlos syndrome, POTS   Exam:  BP (!) 140/68   Pulse 93   Ht 5' 6 (1.676 m)   Wt 189 lb (85.7 kg)   LMP 11/20/2023 (Approximate)   SpO2 97%   BMI 30.51 kg/m  General: Well Developed, well nourished, and in no acute distress.   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  Doing well from a musculoskeletal standpoint.  Largest issue now is POTS.  She is taking adequate salt and water and using compression.  Symptoms are not well-controlled.  She has tried propranolol  in the past and her cardiologist most recently tried her on diltiazem .  She did not tolerate propranolol  or diltiazem .  We spent time talking about the exercise protocol for POTS including [place]/[facility]/Levine protocol.  Next thing would be changing medications.  I have communicated with her cardiologist about options.  Consider midodrine or ivabradine.  Happy to prescribe these medications but I would like some feedback from cardiology since cardiology is involved.  Recheck in 2 months PDMP not reviewed this encounter. No orders of the defined types were placed in this encounter.  No  orders of the defined types were placed in this encounter.    Discussed warning signs or symptoms. Please see discharge instructions. Patient expresses understanding.   The above documentation has been reviewed and is accurate and complete Artist Lloyd, M.D.

## 2023-12-20 NOTE — Patient Instructions (Addendum)
 Thank you for coming in today.   [email or web address]  I have communicated with your cardiologist  Check back in 2 months

## 2023-12-21 ENCOUNTER — Encounter: Payer: Self-pay | Admitting: Medical Oncology

## 2023-12-21 ENCOUNTER — Inpatient Hospital Stay

## 2023-12-21 ENCOUNTER — Inpatient Hospital Stay: Attending: Medical Oncology

## 2023-12-21 ENCOUNTER — Inpatient Hospital Stay: Admitting: Medical Oncology

## 2023-12-21 VITALS — BP 95/65 | HR 88 | Temp 98.1°F | Resp 18 | Ht 66.0 in | Wt 190.1 lb

## 2023-12-21 DIAGNOSIS — D509 Iron deficiency anemia, unspecified: Secondary | ICD-10-CM

## 2023-12-21 DIAGNOSIS — E538 Deficiency of other specified B group vitamins: Secondary | ICD-10-CM | POA: Insufficient documentation

## 2023-12-21 DIAGNOSIS — R7989 Other specified abnormal findings of blood chemistry: Secondary | ICD-10-CM | POA: Diagnosis not present

## 2023-12-21 DIAGNOSIS — E639 Nutritional deficiency, unspecified: Secondary | ICD-10-CM | POA: Diagnosis not present

## 2023-12-21 LAB — CBC
HCT: 38.4 % (ref 36.0–46.0)
Hemoglobin: 12.9 g/dL (ref 12.0–15.0)
MCH: 30 pg (ref 26.0–34.0)
MCHC: 33.6 g/dL (ref 30.0–36.0)
MCV: 89.3 fL (ref 80.0–100.0)
Platelets: 341 K/uL (ref 150–400)
RBC: 4.3 MIL/uL (ref 3.87–5.11)
RDW: 14.3 % (ref 11.5–15.5)
WBC: 6.1 K/uL (ref 4.0–10.5)
nRBC: 0 % (ref 0.0–0.2)

## 2023-12-21 LAB — IRON AND IRON BINDING CAPACITY (CC-WL,HP ONLY)
Iron: 95 ug/dL (ref 28–170)
Saturation Ratios: 22 % (ref 10.4–31.8)
TIBC: 434 ug/dL (ref 250–450)
UIBC: 339 ug/dL

## 2023-12-21 LAB — FERRITIN: Ferritin: 92 ng/mL (ref 11–307)

## 2023-12-21 LAB — RETIC PANEL
Immature Retic Fract: 5.7 % (ref 2.3–15.9)
RBC.: 4.29 MIL/uL (ref 3.87–5.11)
Retic Count, Absolute: 69.1 K/uL (ref 19.0–186.0)
Retic Ct Pct: 1.6 % (ref 0.4–3.1)
Reticulocyte Hemoglobin: 34.3 pg (ref >27.9)

## 2023-12-21 LAB — FOLATE: Folate: 6.4 ng/mL (ref >5.9)

## 2023-12-21 LAB — VITAMIN B12: Vitamin B-12: 388 pg/mL (ref 180–914)

## 2023-12-21 MED ORDER — CYANOCOBALAMIN 1000 MCG/ML IJ SOLN
1000.0000 ug | Freq: Once | INTRAMUSCULAR | Status: AC
  Administered 2023-12-21: 1000 ug via INTRAMUSCULAR
  Filled 2023-12-21: qty 1

## 2023-12-21 NOTE — Progress Notes (Signed)
 Hematology and Oncology Follow Up Visit  Amber [name] [id number] 1980-04-14 43 y.o. 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 GI evaluation was recommended given her family history  (colon cancer of father). She was also start on IV venofer  and is S/P one cycle of treatment.   She also has a history of Mast Cell Activation Syndrome. She is seeing a rheumatologist and immunologist. She is now on zyrtec and pepcid . Feeling a bit better overall.    She reports tolerating the IV iron  well without side effects however she is having more and more reactions to foods/items. She has had multiple anaphylactic reactions so IV iron  was put on hold given risks. She reports that the various iron  supplements are hard on her GI system. She would like to go back on the IV iron . She tolerated this well previously. She started XOLAIR  about 4 months ago to help with her MCAS. It will take about 6 months to have full effect. She is going to start Cromolyn  soon to further help her GI related symptoms.    Today she states that she has been well. She has no concerns today.    She sees a EDS specialist in [facility]- Dr. Joane. She has PT set up with a local EDS PT specialists. She is very excited about all this. She has no restrictions on blood work.   She is tolerating B12 injections well. She administers these every other week at home and every other week here.    There has been no bleeding to her knowledge aside from her regular menstrual cycles  On Mounjaro  for weight loss which she has been on since October.   Wt Readings from Last 3 Encounters:  12/21/23 190 lb 1.9 oz (86.2 kg)  12/20/23 189 lb (85.7 kg)  11/22/23 191 lb 6.4 oz (86.8 kg)     Medications:   Current Outpatient Medications:    cabergoline  (DOSTINEX ) 0.5 MG tablet, 2.5 mg weekly (Two tabs on Mondays, 1 tab on wednesdays and 2 tabs on Fridays), Disp: 65 tablet, Rfl: 3   cetirizine (ZYRTEC) 10 MG chewable tablet, Chew 10 mg by mouth in the morning and at bedtime., Disp: , Rfl:    Cholecalciferol (TRUE VITAMIN D3) 1.25 MG (50000 UT) capsule, Take 1 capsule (50,000 Units total) by mouth daily., Disp: 12 capsule, Rfl: 0   Continuous Glucose Sensor (DEXCOM G7 SENSOR) MISC, USE 1 SENSOR TOPICALLY EVERY 10 DAYS, Disp: 1 each, Rfl: 11   cromolyn  (GASTROCROM ) 100 MG/5ML solution, Take 5 mLs (100 mg total) by mouth 4 (four) times  daily -  before meals and at bedtime., Disp: 480 mL, Rfl: 12   cyanocobalamin  (VITAMIN B12) 1000 MCG/ML injection, Inject 1 mL (1,000 mcg total) into the muscle every 14 (fourteen) days., Disp: 6 mL, Rfl: 4   dexlansoprazole  (DEXILANT ) 60 MG capsule, Take 1 capsule (60 mg total) by mouth daily., Disp: 90 capsule, Rfl: 3   EPINEPHrine  (EPIPEN  2-PAK) 0.3 mg/0.3 mL IJ SOAJ injection, Inject 0.3 mg into the muscle as needed for anaphylaxis., Disp: 1 each, Rfl: 2   Erenumab -aooe (AIMOVIG ) 140 MG/ML SOAJ, Inject 140 mg as directed subcutaneously once a month, Disp: 1 mL, Rfl: 7   escitalopram  (LEXAPRO ) 10 MG tablet, Take 1 tablet (10 mg total) by mouth daily., Disp: 90 tablet,  Rfl: 3   famotidine  (PEPCID ) 20 MG tablet, Take 20 mg by mouth 2 (two) times daily., Disp: , Rfl:    fenofibrate  (TRICOR ) 145 MG tablet, Take 1 tablet (145 mg total) by mouth daily., Disp: 90 tablet, Rfl: 3   folic acid  (FOLVITE ) 1 MG tablet, Take 1 tablet (1 mg total) by mouth daily., Disp: 30 tablet, Rfl: 6   glucose blood (ACCU-CHEK GUIDE TEST) test strip, Use to Check Blood [place], Disp: 100 each, Rfl: 2   ipratropium (ATROVENT ) 0.06 % nasal spray, Place 2 sprays into both nostrils 4 (four) times daily., Disp: 15 mL, Rfl: 12   Menaquinone-7 (VITAMIN K2) 100 MCG CAPS, Take 1 Dose by mouth daily., Disp: , Rfl:    minoxidil  (LONITEN ) 2.5 MG tablet, Take 1 tablet (2.5 mg total) by mouth daily., Disp: 30 tablet, Rfl: 1   omalizumab  (XOLAIR ) 150 MG/ML prefilled syringe, Inject 300 mg into the skin every 28 (twenty-eight) days., Disp: 6 mL, Rfl: 3   ondansetron  (ZOFRAN -ODT) 8 MG disintegrating tablet, Take 1 tablet (8 mg total) by mouth every 8 (eight) hours as needed for nausea or vomiting., Disp: 60 tablet, Rfl: 1   QUEtiapine  (SEROQUEL ) 300 MG tablet, Take 1 tablet (300 mg total) by mouth at bedtime., Disp: 90 tablet, Rfl: 3   Riboflavin 400 MG CAPS, Take 400 mg by mouth daily., Disp: , Rfl:    Rimegepant Sulfate (NURTEC) 75 MG TBDP, Take 1 tablet (75 mg total) by mouth as needed (Daily as needed for a Migraine). Maximum 1 tablet in 24 hours.  Quantity 8., Disp: 8 tablet, Rfl: 5   rosuvastatin  (CRESTOR ) 20 MG tablet, Take 1 tablet (20 mg total) by mouth daily., Disp: 90 tablet, Rfl: 3   Syringe/Needle, Disp, 25G X 1 1 ML MISC, 1 Dose by Does not apply route every 14 (fourteen) days., Disp: 25 each, Rfl: 0   tirzepatide  (MOUNJARO ) 15 MG/0.5ML Pen, Inject 15 mg into the skin once a week., Disp: 2 mL, Rfl: 2   topiramate  (TOPAMAX ) 100 MG tablet, Take 1 tablet (100 mg total) by mouth at bedtime., Disp: 30 tablet, Rfl: 5   traMADol  (ULTRAM ) 50 MG tablet, TAKE ONE TABLET BY MOUTH EVERY 6 HOURS AS NEEDED,  Disp: 30 tablet, Rfl: 2 No current facility-administered medications for this visit.  Facility-Administered Medications Ordered in Other Visits:    cyanocobalamin  (VITAMIN B12) injection 1,000 mcg, 1,000 mcg, Intramuscular, Once, Ennever, Maude SAUNDERS, MD  Allergies:  Allergies  Allergen Reactions   Bee Venom Anaphylaxis   Chlorhexidine Hives, Itching and Rash   Ibuprofen Other (See Comments)    GI bleed   Iodine Hives, Itching and Rash   Peanut-Containing Drug Products Other (See Comments)    Pt reports unknown  Sesame Seed (Diagnostic) Anaphylaxis   Shellfish Allergy  Shortness Of Breath, Swelling and Other (See Comments)    Tingling in the lips, mouth and throat   Soybean-Containing Drug Products Anaphylaxis   Sulfa Antibiotics Anaphylaxis   Triptans Other (See Comments)    Makes pain worse. Pain in joints   Lunesta  [Eszopiclone ] Nausea And Vomiting    GI issues   Almond (Diagnostic) Other (See Comments)    Unknown per pt   Aspirin Other (See Comments)    UNKNOWN REACTION. Her parents and siblings are allergic to Aspirin.   Cashew Nut (Anacardium Occidentale) Skin Test Other (See Comments)    Unknown per pt   Hazelnut (Filbert) Other (See Comments)    Unknown per pt   Nsaids Nausea And Vomiting   Tape Rash    Blisters    Walnut Other (See Comments)    Unknown per pt   Wheat Nausea And Vomiting    Past Medical History, Surgical history, Social history, and Family History were reviewed and updated.  Review of Systems: As stated above in HPI  Physical Exam:  height is 5' 6 (1.676 m) and weight is 190 lb 1.9 oz (86.2 kg). Her oral temperature is 98.1 F (36.7 C). Her blood pressure is 95/65 and her pulse is 88. Her respiration is 18 and oxygen saturation is 100%.   Physical Exam General: NAD Cardiovascular: regular rate and rhythm Pulmonary: clear ant fields Abdomen: soft, nontender, + bowel sounds GU: no suprapubic tenderness Extremities: no edema, no joint  deformities Skin: no rashes Neurological: Weakness but otherwise nonfocal   Lab Results  Component Value Date   WBC 6.1 12/21/2023   HGB 12.9 12/21/2023   HCT 38.4 12/21/2023   MCV 89.3 12/21/2023   PLT 341 12/21/2023     Chemistry      Component Value Date/Time   NA 138 12/11/2023 2220   K 3.8 12/11/2023 2220   CL 105 12/11/2023 2220   CO2 22 12/11/2023 2220   BUN 14 12/11/2023 2220   CREATININE 1.08 (H) 12/11/2023 2220   CREATININE 1.05 (H) 06/26/2023 1415   CREATININE 0.99 12/24/2019 1316      Component Value Date/Time   CALCIUM  9.3 12/11/2023 2220   ALKPHOS 30 (L) 10/15/2023 1924   AST 14 (L) 10/15/2023 1924   AST 27 06/26/2023 1415   ALT 6 10/15/2023 1924   ALT 31 06/26/2023 1415   BILITOT 0.3 10/15/2023 1924   BILITOT 0.5 06/26/2023 1415     Encounter Diagnoses  Name Primary?   Iron  deficiency anemia, unspecified iron  deficiency anemia type Yes   Low vitamin B12 level    Nutritional deficiency    Folate deficiency    Assessment and Plan- Patient is a 43 y.o. female with multifactorial anemia. She has IDA secondary to malabsorption(GERD/PPI use/ MCAS) and from chronic loss (menstrual cycles). She also has a history of nutritional deficiencies including B12 and folate.  She is taking once daily folic acid  1 G along with B12 injections given every other week.   CBC is normal.  Thrombocytosis has resolved with correction of her folate.B12 Retic count is unremarkable today Nutritional labs pending.  Continue B12 injections every two weeks   Disposition: B12 today B12 monthly x 2 RTC 3 month APP, labs (CBC w/, retic, iron , ferritin, B12), B12   Lauraine Dais PA-C 10/2/202512:09 PM

## 2023-12-21 NOTE — Patient Instructions (Signed)
 Vitamin B12 Injection What is this medication? Vitamin B12 (VAHY tuh min B12) prevents and treats low vitamin B12 levels in your body. It is used in people who do not get enough vitamin B12 from their diet or when their digestive tract does not absorb enough. Vitamin B12 plays an important role in maintaining the health of your nervous system and red blood cells. This medicine may be used for other purposes; ask your health care provider or pharmacist if you have questions. COMMON BRAND NAME(S): B-12 Compliance Kit, B-12 Injection Kit, Cyomin, Dodex , LA-12, Nutri-Twelve, Physicians EZ Use B-12, Primabalt, Vitamin Deficiency Injectable System - B12 What should I tell my care team before I take this medication? They need to know if you have any of these conditions: Kidney disease Leber's disease Megaloblastic anemia An unusual or allergic reaction to cyanocobalamin , cobalt, other medications, foods, dyes, or preservatives Pregnant or trying to get pregnant Breast-feeding How should I use this medication? This medication is injected into a muscle or deeply under the skin. It is usually given in a clinic or care team's office. However, your care team may teach you how to inject yourself. Follow all instructions. Talk to your care team about the use of this medication in children. Special care may be needed. Overdosage: If you think you have taken too much of this medicine contact a poison control center or emergency room at once. NOTE: This medicine is only for you. Do not share this medicine with others. What if I miss a dose? If you are given your dose at a clinic or care team's office, call to reschedule your appointment. If you give your own injections, and you miss a dose, take it as soon as you can. If it is almost time for your next dose, take only that dose. Do not take double or extra doses. What may interact with this medication? Alcohol Colchicine This list may not describe all possible  interactions. Give your health care provider a list of all the medicines, herbs, non-prescription drugs, or dietary supplements you use. Also tell them if you smoke, drink alcohol, or use illegal drugs. Some items may interact with your medicine. What should I watch for while using this medication? Visit your care team regularly. You may need blood work done while you are taking this medication. You may need to follow a special diet. Talk to your care team. Limit your alcohol intake and avoid smoking to get the best benefit. What side effects may I notice from receiving this medication? Side effects that you should report to your care team as soon as possible: Allergic reactions--skin rash, itching, hives, swelling of the face, lips, tongue, or throat Swelling of the ankles, hands, or feet Trouble breathing Side effects that usually do not require medical attention (report to your care team if they continue or are bothersome): Diarrhea This list may not describe all possible side effects. Call your doctor for medical advice about side effects. You may report side effects to FDA at 1-800-FDA-1088. Where should I keep my medication? Keep out of the reach of children. Store at room temperature between 15 and 30 degrees C (59 and 85 degrees F). Protect from light. Throw away any unused medication after the expiration date. NOTE: This sheet is a summary. It may not cover all possible information. If you have questions about this medicine, talk to your doctor, pharmacist, or health care provider.  2024 Elsevier/Gold Standard (2020-11-17 00:00:00)

## 2023-12-22 NOTE — Progress Notes (Signed)
 Patient advised.   Patient wants to see if she can get tested for small fiber neuropathy

## 2023-12-25 ENCOUNTER — Other Ambulatory Visit: Payer: Self-pay

## 2023-12-25 ENCOUNTER — Encounter: Payer: Self-pay | Admitting: Family Medicine

## 2023-12-25 ENCOUNTER — Ambulatory Visit: Payer: Self-pay | Admitting: Medical Oncology

## 2023-12-25 ENCOUNTER — Ambulatory Visit (INDEPENDENT_AMBULATORY_CARE_PROVIDER_SITE_OTHER): Admitting: Family Medicine

## 2023-12-25 VITALS — BP 110/68 | HR 89 | Temp 97.9°F | Resp 16 | Ht 66.0 in | Wt 187.8 lb

## 2023-12-25 DIAGNOSIS — R1111 Vomiting without nausea: Secondary | ICD-10-CM

## 2023-12-25 MED ORDER — DEXCOM G7 SENSOR MISC: 11 refills | Status: AC

## 2023-12-25 NOTE — Patient Instructions (Addendum)
 Give us  2-3 business days to get the results of your labs back.   Stay hydrated.   Reach out to Dr. JAYSON to follow up on this.   Let us  know if you need anything.

## 2023-12-25 NOTE — Progress Notes (Signed)
 Chief Complaint  Patient presents with   Vomiting    Subjective: Patient is a 43 y.o. female here for vomiting.  Over the past 1-1/2 weeks, the patient has woken up in the melanite retching.  This has happened 4 times.  It seems random.  It would last for a few moments and then passed.  Most recently she did throw up.  She has no daytime symptoms.  She has a history of reflux and it does not feel similar.  No associated foods or quantity of food ingested associated with this.  No alcohol or illicit drug use.  No medication changes.  No sick contacts, fevers, abdominal pain, nausea, bleeding, increased stress, neurologic signs or symptoms.  She is compliant with her reflux medication.  She is certain she is not pregnant.  Past Medical History:  Diagnosis Date   Anxiety    Asthma    Depression    Diabetes (HCC)    GERD (gastroesophageal reflux disease)    History of chicken pox    History of migraine headaches    Migraines    Pituitary adenoma (HCC)    POTS (postural orthostatic tachycardia syndrome)    Seizures (HCC)     Objective: BP 110/68 (BP Location: Left Arm, Patient Position: Sitting)   Pulse 89   Temp 97.9 F (36.6 C) (Temporal)   Resp 16   Ht 5' 6 (1.676 m)   Wt 187 lb 12.8 oz (85.2 kg)   SpO2 98%   BMI 30.31 kg/m  General: Awake, appears stated age Mouth: MMM Abdomen: Bowel sounds present, soft, nontender, nondistended. Heart: RRR, no LE edema Lungs: CTAB, no rales, wheezes or rhonchi. No accessory muscle use Psych: Age appropriate judgment and insight, normal affect and mood  Assessment and Plan: Vomiting without nausea, unspecified vomiting type - Plan: Urine Microscopic Only, Urine Culture, CBC, Comprehensive metabolic panel with GFR  Check above.  May have to reach out to GI on this one.  No obvious trigger from my end. The patient voiced understanding and agreement to the plan.  Mabel [place], DO 12/25/23  4:58 PM

## 2023-12-26 ENCOUNTER — Ambulatory Visit: Payer: Self-pay | Admitting: Family Medicine

## 2023-12-26 ENCOUNTER — Ambulatory Visit (HOSPITAL_BASED_OUTPATIENT_CLINIC_OR_DEPARTMENT_OTHER)
Admission: RE | Admit: 2023-12-26 | Discharge: 2023-12-26 | Disposition: A | Discharge status: Home / Self Care | Source: Ambulatory Visit | Attending: Family Medicine | Admitting: Family Medicine

## 2023-12-26 DIAGNOSIS — R748 Abnormal levels of other serum enzymes: Secondary | ICD-10-CM

## 2023-12-26 DIAGNOSIS — H93A1 Pulsatile tinnitus, right ear: Secondary | ICD-10-CM | POA: Insufficient documentation

## 2023-12-26 LAB — COMPREHENSIVE METABOLIC PANEL WITH GFR
ALT: 9 U/L (ref 0–35)
AST: 14 U/L (ref 0–37)
Albumin: 4.4 g/dL (ref 3.5–5.2)
Alkaline Phosphatase: 35 U/L — ABNORMAL LOW (ref 39–117)
BUN: 16 mg/dL (ref 6–23)
CO2: 23 meq/L (ref 19–32)
Calcium: 9.5 mg/dL (ref 8.4–10.5)
Chloride: 104 meq/L (ref 96–112)
Creatinine, Ser: 1.13 mg/dL (ref 0.40–1.20)
GFR: 59.41 mL/min — ABNORMAL LOW
Glucose, Bld: 72 mg/dL (ref 70–99)
Potassium: 4.2 meq/L (ref 3.5–5.1)
Sodium: 135 meq/L (ref 135–145)
Total Bilirubin: 0.6 mg/dL (ref 0.2–1.2)
Total Protein: 6.6 g/dL (ref 6.0–8.3)

## 2023-12-26 LAB — URINALYSIS, MICROSCOPIC ONLY: RBC / HPF: NONE SEEN

## 2023-12-26 LAB — URINE CULTURE
MICRO NUMBER:: 17060994
SPECIMEN QUALITY:: ADEQUATE

## 2023-12-26 LAB — CBC
HCT: 39.2 % (ref 36.0–46.0)
Hemoglobin: 13.1 g/dL (ref 12.0–15.0)
MCHC: 33.5 g/dL (ref 30.0–36.0)
MCV: 89.2 fl (ref 78.0–100.0)
Platelets: 316 K/uL (ref 150.0–400.0)
RBC: 4.4 Mil/uL (ref 3.87–5.11)
RDW: 14.1 % (ref 11.5–15.5)
WBC: 7.2 K/uL (ref 4.0–10.5)

## 2023-12-26 MED ORDER — GADOBUTROL 1 MMOL/ML IV SOLN
8.5000 mL | Freq: Once | INTRAVENOUS | Status: AC | PRN
  Administered 2023-12-26: 8.5 mL via INTRAVENOUS

## 2023-12-27 ENCOUNTER — Ambulatory Visit: Admitting: Neurology

## 2023-12-27 ENCOUNTER — Ambulatory Visit (INDEPENDENT_AMBULATORY_CARE_PROVIDER_SITE_OTHER)

## 2023-12-27 ENCOUNTER — Ambulatory Visit: Payer: Self-pay | Admitting: Family Medicine

## 2023-12-27 DIAGNOSIS — R748 Abnormal levels of other serum enzymes: Secondary | ICD-10-CM

## 2023-12-27 LAB — MAGNESIUM: Magnesium: 2 mg/dL (ref 1.5–2.5)

## 2023-12-27 LAB — TSH: TSH: 1.07 u[IU]/mL (ref 0.35–5.50)

## 2023-12-27 NOTE — Telephone Encounter (Signed)
 Will send for add on for tsh and magnesium .  Patient will have to return for zinc.  Its processed differently.

## 2023-12-28 ENCOUNTER — Encounter: Payer: Self-pay | Admitting: Psychology

## 2023-12-28 ENCOUNTER — Ambulatory Visit: Admitting: Psychology

## 2023-12-28 DIAGNOSIS — F411 Generalized anxiety disorder: Secondary | ICD-10-CM | POA: Diagnosis not present

## 2023-12-28 DIAGNOSIS — F423 Hoarding disorder: Secondary | ICD-10-CM
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
## 2023-12-28 NOTE — Progress Notes (Signed)
   Amber Dames, PhD

## 2024-01-01 ENCOUNTER — Ambulatory Visit: Attending: Family Medicine

## 2024-01-01 DIAGNOSIS — Z7409 Other reduced mobility: Secondary | ICD-10-CM | POA: Diagnosis present

## 2024-01-01 DIAGNOSIS — M2550 Pain in unspecified joint: Secondary | ICD-10-CM | POA: Insufficient documentation

## 2024-01-01 NOTE — Therapy (Signed)
 OUTPATIENT PHYSICAL THERAPY TREATMENT  Patient Name: Amber [name] MRN: [id number] DOB:1980-11-27, 43 y.o., female Today's Date: 01/01/2024  END OF SESSION:  PT End of Session - 01/01/24 1444     Visit Number 7    Number of Visits 17    Date for Recertification  02/12/24    Authorization Type [facility]    PT Start Time 1445    PT Stop Time 1530    PT Time Calculation (min) 45 min    Activity Tolerance Patient tolerated treatment well    Behavior During Therapy WFL for tasks assessed/performed            Past Medical History:  Diagnosis Date   Anxiety    Asthma    Depression    Diabetes (HCC)    GERD (gastroesophageal reflux disease)    History of chicken pox    History of migraine headaches    Migraines    Pituitary adenoma (HCC)    POTS (postural orthostatic tachycardia syndrome)    Seizures (HCC)    Past Surgical History:  Procedure Laterality Date   APPENDECTOMY     CHOLECYSTECTOMY     ESOPHAGEAL MANOMETRY N/A 07/12/2023   Procedure: MANOMETRY, ESOPHAGUS;  Surgeon: San Sandor GAILS, DO;  Location: [facility];  Service: Gastroenterology;  Laterality: N/A;   PH IMPEDANCE STUDY N/A 07/12/2023   Procedure: IMPEDANCE PH STUDY, ESOPHAGUS;  Surgeon: San Sandor GAILS, DO;  Location: [facility];  Service: Gastroenterology;  Laterality: N/A;   Pituiary Tumor     Tumor Removal Pituitary   Patient Active Problem List   Diagnosis Date Noted   EDS (Ehlers-Danlos syndrome) 09/20/2023   Dysautonomia (HCC) 09/20/2023   Bilateral hand swelling 07/25/2023   Palpitations 03/13/2023   Paroxysmal tachycardia (HCC) 03/13/2023   Mixed hyperlipidemia 02/14/2023   Iron  deficiency anemia 01/13/2023   Type 2 diabetes mellitus with hyperglycemia, without long-term current use of insulin  (HCC) 12/28/2022   Asthma 03/23/2021   Hypertriglyceridemia 11/10/2020   Myofascial pain 05/07/2020   Multiple closed fractures of ribs of right side 05/07/2020   Numbness of toes  05/07/2020   Prediabetes 04/28/2020   Pituitary microadenoma (HCC) 04/28/2020   Prolactinoma (HCC) 04/28/2020   Pre-diabetes 12/25/2019   Infertility counseling 12/24/2019   Well woman exam 12/24/2019   Hyperglycemia 06/05/2019   Insomnia 01/07/2019   Bilateral hand pain 11/09/2018   Agoraphobia 01/18/2018   Anxiety with depression 03/24/2017   Migraine 05/11/2016   Pituitary adenoma (HCC) 03/05/2016   Hyperprolactinemia 03/02/2016    PCP: Frann Mabel Mt DO  REFERRING PROVIDER: Joane Birmingham MD   REFERRING DIAG: M25.50 (ICD-10-CM) - Hypermobility arthralgia   Rationale for Evaluation and Treatment: Rehabilitation  THERAPY DIAG:  Hypermobility arthralgia  Decreased functional mobility and endurance  ONSET DATE: chronic 5+   SUBJECTIVE:  SUBJECTIVE STATEMENT: Pt presents to PT with acute mid back pain after fall where she was tripped by her dogs. Over last few weeks has had many POTS flares and has generally not been feeling well, which is why she had to cancel and miss a few appointments with PT.   EVAL: Pt reports being worked up for about 5 yrs due to hand issues but does recall subluxing hips as a child.  Primary MD gave her some exercises. She has difficulty with stability and balance during functional activities at home.  Since losing wgt (95 lbs) she has had more joint pain.  Seeing Neuro soon for workup for tingling in feet and hands.  She has difficulty standing and walking (avoids shopping), activity in general.  She does walk the dogs every other day (1/2 mile).  Someday she can't make it the full time.  She has difficulty with lifting, hand function and gripping.  Compression gloves at night. She has cubital tunnel elbow braces but she does not wear them much.  She did OT for only  1 visit but did not return, was recommended to follow up about the hypermobility in her hands at that time.   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 about 1.5 yrs    Has the patient been formally diagnosed with EDS or HSD? If yes indicate the type EDS-type III recent diagnosis    If no ,does the patient feel they may have EDS or HSD?  Patient reports symptoms and/or instability in the following areas:  - Cervical Spine: [x] Pain [] Instability [] Limited ROM [] Paresthesia  - Thoracic Spine: [] Pain [] Instability  - Lumbar Spine: [x] Pain [] Instability [] Frequent locking/giving out  - Shoulder: [] L [x] R  [x] Subluxation [] Dislocations [x] Pain [] Fatigue  - Elbow: [] L [] R  [] Instability [] Hyperextension [] Pain  - Wrist/Hand: [x] L [x] R  [x] Instability [] Fatigue with use [x] Pain  - Hip: [x] L [x] R  [x] Instability [x] Pain [x] Clicking [] Gait deviations  - Knee: [x] L [x] R  [x] Instability [x] Hyperextension [x] Pain  - Ankle/Foot: [x] L [x] R  [x] Instability [] Frequent sprains Pain , Lt. 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, husband is disabled and she helps him with more executive functioning activities ____________________________   Patient-reported Beighton Score (if known): _____8__/9  Clinician-assessed Beighton Score (today): _____8__/9     PERTINENT HISTORY:  MD note:    MS: bilat hip pain w/ sublux in middle school, bilat hand pain and swelling, bilat shoulder instability R>L Skin/Immune reactions: stretchy skin, chronic urticaria Nervous system: dizziness esp w/ transitioning positions Head/Spine: chronic migraines, insomnia CV: tachycardia when  transitioning to stand GI: GERD, chronic diarrhea, intermittent constipation , Genitourinary: none Hands & Feet: swell, paresthesia bilat hands and feet   PAIN:  Are you having pain?  Yes: NPRS scale: 7/10 Pain location: hips lateral and groin Pain description: sore and achy  Aggravating factors: sitting too long Relieving factors: pretzel, Tylenol , heating pad  Are you having pain?  Yes: NPRS scale: 5/10  Pain location: Rt shoulder  Pain description: moving around a lot  Aggravating factors: overuse , handling dog  Relieving factors: heating pad    PRECAUTIONS: None  RED FLAGS: Monitor syncope   WEIGHT BEARING RESTRICTIONS: No  FALLS:  Has patient fallen in  last 6 months? 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  DIAGNOSTIC FINDINGS:  none  CARDIO/ORTHOSTATICS: Baseline RHR 99 Standing HR 125  Baseline BP 90/62 Standing BP 101/53 min dizziness    PATIENT SURVEYS:  Quick Dash:  QUICK DASH  Please rate your ability do the following activities in the last week by selecting the number below the appropriate response.   Activities Rating  Open a tight or new jar.  3 = Moderate difficulty  Do heavy household chores (e.g., wash walls, floors). 3 = Moderate difficulty  Carry a shopping bag or briefcase 4 = Severe difficulty  Wash your back. 2 = Mild difficulty  Use a knife to cut food. 2  = Mild difficulty  Recreational activities in which you take some force or impact through your arm, shoulder or hand (e.g., golf, hammering, tennis, etc.). 3 = Moderate difficulty  During the past week, to what extent has your arm, shoulder or hand problem interfered with your normal social activities with family, friends, neighbors or groups?  3 = Moderately  During the past week, were you limited in your work or other regular daily activities as a result of your arm, shoulder or hand problem? 3 = Moderately limited  During the past week, were you limited in your work or other regular daily activities as a result of your arm, shoulder or hand problem? 3 = Moderate  Tingling (pins and needles) in your arm, shoulder or hand. 3 = Moderate  During the past week, how much difficulty have you had sleeping because of the pain in your arm, shoulder or hand?  3 = Moderate difficulty   (A QuickDASH score may not be calculated if there is greater than 1 missing item.)  Quick Dash Disability/Symptom Score: [(sum of 32 (n) responses/11 (n)] x 25 = 47% able   Minimally Clinically Important Difference (MCID): 15-20 points  (Franchignoni, F. et al. (2013). Minimally clinically important difference of the disabilities of the arm, shoulder, and hand outcome measures (DASH) and its shortened version (Quick DASH). Journal of Orthopaedic & Sports Physical Therapy, 44(1), 30-39)   COGNITION: Overall cognitive status: Within functional limits for tasks assessed     SENSATION: Feet tingling at frequently , some N/T in L elbow from surgery    POSTURE: rounded shoulders, forward head, increased thoracic kyphosis, and posterior pelvic tilt  PALPATION: Sore to palpation Rt shoulder grossly  Skin is extensible and very smooth soft    LUMBAR ROM:   AROM eval  Flexion Palms flat   Extension   Right lateral flexion   Left lateral flexion   Right rotation   Left rotation    (Blank rows = not  tested)  LOWER EXTREMITY ROM:  PROM WNL, hypermobile IR >ER   LOWER EXTREMITY MMT:    MMT Right eval Left eval  Hip flexion 4+ 4+  Hip extension    Hip abduction    Hip adduction    Hip  internal rotation    Hip external rotation    Knee flexion 5 5  Knee extension 5 5  Ankle dorsiflexion    Ankle plantarflexion    Ankle inversion    Ankle eversion     (Blank rows = not tested)  LUMBAR SPECIAL TESTS:  NT   CERVICAL ROM:  NT on eval  Active ROM A/PROM (deg) 01/01/2024  Flexion   Extension   Right lateral flexion   Left lateral flexion   Right rotation   Left rotation    (Blank rows = not tested)  UE ROM: WNL  Pain limits Rt shoulder combo ER/ and abduction  UE MMT:  MMT Right eval Left eval  Shoulder flexion 4-pain  4  Shoulder extension    Shoulder abduction 4-pain  4  Shoulder adduction    Shoulder extension    Shoulder internal rotation 4 pain  4+  Shoulder external rotation 4 pain  4+  Middle trapezius    Lower trapezius    Elbow flexion 4 4+  Elbow extension 4 4+  Wrist flexion    Wrist extension    Wrist ulnar deviation    Wrist radial deviation    Wrist pronation    Wrist supination    Grip strength     (Blank rows = not tested)  CERVICAL SPECIAL TESTS:  NT   FUNCTIONAL TESTS:  5 times sit to stand: 18.2 sec   30 seconds chair stand test 9 reps  2WMT: 334ft HR -     Beighton Scale Lumbar (_1/1) Knees (_1/2) Elbows (_2/2)  5th digit (_2/2) Thumb (_2/2)  8/9: knee hyper extension  5 deg on Rt and 10 on Lt   TREATMENT: [facility] Adult PT Treatment:                                                DATE: 01/01/2024 Hook-lying PPT x 10 - 5 hold Supine bilateral ER 2x10 GTB Supine horizontal abd 2x15 GTB PPT into bridge 3x10 with pilates ring Pilates SLR 2x10 ea PPT with pilates ring x 15 S/L hip abd 2x10 ea  [facility] Adult PT Treatment:                                                DATE: 11/16/2023 Assessment of tests/measures,  goals, and outcomes Hook-lying PPT x 10 - 5 hold PPT into bridge 2x15 with pilates ring Pilates SLR 2x10 ea Supine R shoulder flex 2x10 3# Standing row 3x12 blue band Standing ER 2x10 YTB Shoulder ext with scap retraction 2x15 GTB  [facility] Adult PT Treatment:                                                DATE: 11/14/2023 NuStep lvl 5 UE/LE x 4 min for activity tolerance while monitoring HR  Hook-lying PPT x 10 - 5 hold Hook-lying PPT with pilates add 2x10 PPT into bridge 2x10 with pilates ring Pilates SLR 2x10 ea S/L hip abd 2x10 ea Seated bilateral ER 2x10 RTB R shoulder flexion 2x10 3#  [facility] Adult PT Treatment:  DATE: 11/09/2023 NuStep lvl 4 UE/LE x 4 min for activity tolerance while monitoring HR  Supine PPT x 10 - 5 hold PPT into bridge 2x10 with pilates ring Pilates SLR 2x10 ea PPT with shoulder pulldown 3x10 BTB Pilates ring horizontal abd 2x10 with elevation to 90 deg ER isometric walkout x 10 YTB ea shoulder Standing row 2x12 blue band  [facility] Adult PT Treatment:                                                DATE: 10/31/2023 NuStep lvl 3 LE only x 4 min for activity tolerance while monitoring HR Supine PPT x 10 - 5 hold Supine PPT with ball 2x10 - 5 hold Pilates SLR 2x10 ea PPT into bridge 2x10 with pilates ring PPT with shoulder pulldown 2x10 GTB Supine R shoulder flexion 2x10 2# S/L R shoulder ER 2x10 2# Seated horizontal abd 2x10 RTB  [facility] Adult PT Treatment:                                                DATE: 10/12/2023 - 373ft Supine PPT x 10 - 5 hold PPT into bridge 2x10 S/L clamshell 2x10 RTB Seated scapular retraction x 10 - 5 hold R shoulder ER isometric x 10 - 5 hold Seated bilateral ER RTB 3x10  PATIENT EDUCATION:  Education details:  Posture, alignment, neutral zone and joint protection PNE/Explain pain    Joint inflammation/collagen/ligament laxity, muscular support Stretching vs  stabilizing  Person educated: Patient Education method: Explanation Education comprehension: verbalized understanding   HOME EXERCISE PROGRAM: Access Code: [email or web address] URL: [email or web address] Date: 01/01/2024 Prepared by: Alm Kingdom  Exercises - Supine Posterior Pelvic Tilt  - 1 x daily - 7 x weekly - 2 sets - 10 reps - 5 sec hold - Supine Bridge  - 1 x daily - 7 x weekly - 2 sets - 10 reps - Clamshell with Resistance  - 1 x daily - 7 x weekly - 2 sets - 15 reps - red band hold - Supine Shoulder Horizontal Abduction with Resistance  - 1 x daily - 7 x weekly - 2-3 sets - 15 reps - green band hold - Sidelying Hip Abduction  - 1 x daily - 7 x weekly - 3 sets - 15 reps - Shoulder External Rotation and Scapular Retraction with Resistance  - 1 x daily - 7 x weekly - 3 sets - 10 reps - green band (supine) hold - Supine Shoulder Flexion Extension Full Range AROM  - 1 x daily - 7 x weekly - 2 sets - 10 reps - 2lbs hold - Supine 90/90 Abdominal Bracing  - 1 x daily - 7 x weekly - 2-3 reps - 15 sec hold  ASSESSMENT:  CLINICAL IMPRESSION: Pt was able to complete prescribed exercises with no adverse effect. Today we stayed in supine with continued emphasis on core stability strengthening and periscapular strength. HEP was updated to reflect change in treatment approach to stay mostly supine in order to mitigate POTS symptoms. Pt continues to benefit form skilled PT services and should continued to be seen and progressed as able per POC. Will decrease frequency to 1x/wk for [redacted] weeks along with HEP compliance and  continued symptom management.   OBJECTIVE IMPAIRMENTS: cardiopulmonary status limiting activity, decreased activity tolerance, decreased coordination, decreased endurance, decreased mobility, difficulty walking, decreased ROM, decreased strength, impaired UE functional use, postural dysfunction, obesity, and pain.   ACTIVITY LIMITATIONS: carrying, lifting, bending, sitting,  standing, squatting, sleeping, stairs, transfers, reach over head, locomotion level, and caring for others  PARTICIPATION LIMITATIONS: meal prep, cleaning, laundry, interpersonal relationship, shopping, and community activity  PERSONAL FACTORS: Past/current experiences, Time since onset of injury/illness/exacerbation, and 3+ comorbidities: dysautonomia/syncope, MCAS, L elbow post surgery  are also affecting patient's functional outcome.   REHAB POTENTIAL: Excellent  CLINICAL DECISION MAKING: Evolving/moderate complexity  EVALUATION COMPLEXITY: Moderate   GOALS: Goals reviewed with patient? Yes  SHORT TERM GOALS: Target date: 11/01/2023   Patient will be able to show independence for initial HEP to include posture, core and hip strength and stability.   Baseline: Goal status: MET  2.    Patient will be able to complete functional testing for endurance, balance and strength. Baseline:  Goal status:MET  3.  Pt will report improved awareness of core and posture when performing ADLs  Baseline:  Goal status: IN PROGRESS   LONG TERM GOALS: Target date: 02/12/2024   Patient will be independent with final HEP upon discharge from PT and report consistent benefit following exercise completion.    Baseline:  Goal status: INITIAL  2.  Pt will improve 2 min walk test distance by 50 feet or more with LRAD and no increase in pain.   Baseline: 355 11/16/23: 350ft Goal status: IN PROGRESS  3.  Patient will be I with concepts of joint protection and stability as it pertains to joint hypermobility. Baseline:  Goal status: INITIAL  4.  Pt will be able to demonstrate good standing balance as evidenced by balance recovery from min to mod dynamic balance challenges.   Baseline:  Goal status: INITIAL  5.  Pt will be able to consistently walk dogs 1/2 mile without undue fatigue and pain < 2/10. Baseline:  Goal status: INITIAL    PLAN:  PT FREQUENCY: 1x/week  PT DURATION: 6  weeks  PLANNED INTERVENTIONS: 97164- PT Re-evaluation, 97750- Physical Performance Testing, 97110-Therapeutic exercises, 97530- Therapeutic activity, [id number]- Neuromuscular re-education, 97535- Self Care, [id number]- Manual therapy, 20560 (1-2 muscles), 20561 (3+ muscles)- Dry Needling, Patient/Family education, Balance training, Taping, Cryotherapy, and Moist heat.  PLAN FOR NEXT SESSION: establish HEP and complete 2 min walk, check BP.HR . Focus on stability training and core control    Alm JAYSON Kingdom, PT 01/01/2024, 4:45 PM

## 2024-01-05 ENCOUNTER — Telehealth: Admitting: Physician Assistant

## 2024-01-05 ENCOUNTER — Ambulatory Visit: Attending: Cardiology | Admitting: Cardiology

## 2024-01-05 ENCOUNTER — Ambulatory Visit: Payer: Self-pay

## 2024-01-05 ENCOUNTER — Encounter: Payer: Self-pay | Admitting: Cardiology

## 2024-01-05 ENCOUNTER — Encounter: Admitting: Physician Assistant

## 2024-01-05 VITALS — BP 93/66 | HR 84 | Resp 16 | Ht 66.0 in | Wt 185.0 lb

## 2024-01-05 DIAGNOSIS — B9789 Other viral agents as the cause of diseases classified elsewhere: Secondary | ICD-10-CM

## 2024-01-05 DIAGNOSIS — G909 Disorder of the autonomic nervous system, unspecified: Secondary | ICD-10-CM | POA: Diagnosis not present

## 2024-01-05 DIAGNOSIS — J028 Acute pharyngitis due to other specified organisms: Secondary | ICD-10-CM | POA: Diagnosis not present

## 2024-01-05 DIAGNOSIS — J029 Acute pharyngitis, unspecified: Secondary | ICD-10-CM

## 2024-01-05 MED ORDER — IVABRADINE HCL 5 MG PO TABS: 5.0000 mg | ORAL_TABLET | Freq: Two times a day (BID) | ORAL | 3 refills | Status: DC

## 2024-01-05 NOTE — Telephone Encounter (Signed)
 FYI Only or Action Required?: FYI only for provider.  Patient was last seen in primary care on 12/25/2023 by Frann Mabel Mt, DO.  Called Nurse Triage reporting Sore Throat.  Symptoms began 2 days ago.  Interventions attempted: Nothing.  Symptoms are: unchanged.  Triage Disposition: See Physician Within 24 Hours  Patient/caregiver understands and will follow disposition?: Yes Reason for Disposition  Earache also present  Answer Assessment - Initial Assessment Questions Has not taken anything OTC. Schedule virtual UC for patient today at 3:15  1. ONSET: When did the throat start hurting? (Hours or days ago)      48 hours ago  2. SEVERITY: How bad is the sore throat? (Scale 1-10; mild, moderate or severe)     Moderate  3. STREP EXPOSURE: Has there been any exposure to strep within the past week? If Yes, ask: What type of contact occurred?      Denies  4.  VIRAL SYMPTOMS: Are there any symptoms of a cold, such as a runny nose, cough, hoarse voice or red eyes?      Denies, has chronic Rhinitis.   5. FEVER: Do you have a fever? If Yes, ask: What is your temperature, how was it measured, and when did it start?     Denies  6. PUS ON THE TONSILS: Is there pus on the tonsils in the back of your throat?     White spots on both tonsils, more on the right but hurts to swallow on the left side  7. OTHER SYMPTOMS: Do you have any other symptoms? (e.g., difficulty breathing, headache, rash)     Left earache, dull, but worse when talking and swallowing  Protocols used: Sore Throat-A-AH  Copied from CRM #[id number]. Topic: Clinical - Red Word Triage >> Jan 05, 2024  1:07 PM Nessti S wrote: [facility] that prompted transfer to Nurse Triage: sore throat and right ear hurts

## 2024-01-05 NOTE — Telephone Encounter (Signed)
 Telehealth appt scheduled.

## 2024-01-05 NOTE — Progress Notes (Signed)
  Electrophysiology Office Note:   Date:  01/05/2024  ID:  Amber [name], DOB [birth year], MRN [id number]  Primary Cardiologist: None Primary Heart Failure: None Electrophysiologist: Reva Pinkley Gladis Norton, MD      History of Present Illness:   Amber [name] is a 43 y.o. female with h/o palpitations, diabetes, pituitary adenoma, autonomic dysfunction seen today for routine electrophysiology followup.   Since last being seen in our clinic the patient reports continued symptoms.  She feels well today but had a bad day yesterday.  She had an episode of syncope when she stood up quickly from a kneeling position.  She has been able to do her daily activities.  She has increased her fluid and electrolyte intake.  She is also using a recumbent bike and walking.  She is interested in potentially starting Corlanor.  she denies chest pain, palpitations, dyspnea, PND, orthopnea, nausea, vomiting, dizziness, syncope, edema, weight gain, or early satiety.   Review of systems complete and found to be negative unless listed in HPI.   EP Information / Studies Reviewed:    EKG is not ordered today. EKG from 11/13/2023 reviewed which showed sinus tachycardia        Risk Assessment/Calculations:            Physical Exam:   VS:  BP 93/66 (BP Location: Left Arm, Patient Position: Sitting, Cuff Size: Large)   Pulse 84   Resp 16   Ht 5' 6 (1.676 m)   Wt 185 lb (83.9 kg)   SpO2 98%   BMI 29.86 kg/m    Wt Readings from Last 3 Encounters:  01/05/24 185 lb (83.9 kg)  12/25/23 187 lb 12.8 oz (85.2 kg)  12/21/23 190 lb 1.9 oz (86.2 kg)     GEN: Well nourished, well developed in no acute distress NECK: No JVD; No carotid bruits CARDIAC: Regular rate and rhythm, no murmurs, rubs, gallops RESPIRATORY:  Clear to auscultation without rales, wheezing or rhonchi  ABDOMEN: Soft, non-tender, non-distended EXTREMITIES:  No edema; No deformity   ASSESSMENT AND PLAN:    1.  Autonomic dysfunction:  Potentially related to POTS.  She continues to have symptoms associated with syncope and near syncope.  She is exercising and has increased her fluid and electrolyte intake.  As she is interested in starting Corlanor, we Ilija Maxim start today at 5 mg.  2.  Recurrent syncope: Appears due to orthostasis.  Hydrating well.  Gently related to her autonomic dysfunction.  Follow up with EP Team in 6 months  Signed, Rechy Bost Gladis Norton, MD

## 2024-01-05 NOTE — Progress Notes (Signed)
 Virtual Visit Consent   Nneoma Harral, you are scheduled for a virtual visit with a [facility] provider today. Just as with appointments in the office, your consent must be obtained to participate. Your consent will be active for this visit and any virtual visit you may have with one of our providers in the next 365 days. If you have a MyChart account, a copy of this consent can be sent to you electronically.  As this is a virtual visit, video technology does not allow for your provider to perform a traditional examination. This may limit your provider's ability to fully assess your condition. If your provider identifies any concerns that need to be evaluated in person or the need to arrange testing (such as labs, EKG, etc.), we will make arrangements to do so. Although advances in technology are sophisticated, we cannot ensure that it will always work on either your end or our end. If the connection with a video visit is poor, the visit may have to be switched to a telephone visit. With either a video or telephone visit, we are not always able to ensure that we have a secure connection.  By engaging in this virtual visit, you consent to the provision of healthcare and authorize for your insurance to be billed (if applicable) for the services provided during this visit. Depending on your insurance coverage, you may receive a charge related to this service.  I need to obtain your verbal consent now. Are you willing to proceed with your visit today? Marlys Stegmaier has provided verbal consent on 01/05/2024 for a virtual visit (video or telephone). Teena Shuck, [place]  Date: 01/05/2024 3:24 PM   Virtual Visit via Video Note   I, Teena Shuck, connected with  Almarie Brim  ([id number], [birth year]) on 01/05/24 at  3:15 PM EDT by a video-enabled telemedicine application and verified that I am speaking with the correct person using two identifiers.  Location: Patient: Virtual Visit  Location Patient: Home Provider: Virtual Visit Location Provider: Home Office   I discussed the limitations of evaluation and management by telemedicine and the availability of in person appointments. The patient expressed understanding and agreed to proceed.    History of Present Illness: Alencia Gordon is a 43 y.o. who identifies as a female who was assigned female at birth, and is being seen today for sore throat. SABRA  HPI: Sore Throat  This is a new problem. The current episode started in the past 7 days. The problem has been unchanged. There has been no fever. The pain is mild. Associated symptoms include ear pain.    Problems:  Patient Active Problem List   Diagnosis Date Noted   EDS (Ehlers-Danlos syndrome) 09/20/2023   Dysautonomia (HCC) 09/20/2023   Bilateral hand swelling 07/25/2023   Palpitations 03/13/2023   Paroxysmal tachycardia (HCC) 03/13/2023   Mixed hyperlipidemia 02/14/2023   Iron  deficiency anemia 01/13/2023   Type 2 diabetes mellitus with hyperglycemia, without long-term current use of insulin  (HCC) 12/28/2022   Asthma 03/23/2021   Hypertriglyceridemia 11/10/2020   Myofascial pain 05/07/2020   Multiple closed fractures of ribs of right side 05/07/2020   Numbness of toes 05/07/2020   Prediabetes 04/28/2020   Pituitary microadenoma (HCC) 04/28/2020   Prolactinoma (HCC) 04/28/2020   Pre-diabetes 12/25/2019   Infertility counseling 12/24/2019   Well woman exam 12/24/2019   Hyperglycemia 06/05/2019   Insomnia 01/07/2019   Bilateral hand pain 11/09/2018   Agoraphobia 01/18/2018   Anxiety with depression 03/24/2017   Migraine 05/11/2016  Pituitary adenoma (HCC) 03/05/2016   Hyperprolactinemia 03/02/2016    Allergies:  Allergies  Allergen Reactions   Bee Venom Anaphylaxis   Chlorhexidine Hives, Itching and Rash   Ibuprofen Other (See Comments)    GI bleed   Iodine Hives, Itching and Rash   Peanut-Containing Drug Products Other (See Comments)    Pt  reports unknown   Sesame Seed (Diagnostic) Anaphylaxis   Shellfish Allergy  Shortness Of Breath, Swelling and Other (See Comments)    Tingling in the lips, mouth and throat   Soybean-Containing Drug Products Anaphylaxis   Sulfa Antibiotics Anaphylaxis   Triptans Other (See Comments)    Makes pain worse. Pain in joints   Lunesta  [Eszopiclone ] Nausea And Vomiting    GI issues   Almond (Diagnostic) Other (See Comments)    Unknown per pt   Aspirin Other (See Comments)    UNKNOWN REACTION. Her parents and siblings are allergic to Aspirin.   Cashew Nut (Anacardium Occidentale) Skin Test Other (See Comments)    Unknown per pt   Hazelnut (Filbert) Other (See Comments)    Unknown per pt   Nsaids Nausea And Vomiting   Tape Rash    Blisters    Walnut Other (See Comments)    Unknown per pt   Wheat Nausea And Vomiting   Medications:  Current Outpatient Medications:    cabergoline  (DOSTINEX ) 0.5 MG tablet, 2.5 mg weekly (Two tabs on Mondays, 1 tab on wednesdays and 2 tabs on Fridays), Disp: 65 tablet, Rfl: 3   cetirizine (ZYRTEC) 10 MG chewable tablet, Chew 10 mg by mouth in the morning and at bedtime., Disp: , Rfl:    Cholecalciferol (TRUE VITAMIN D3) 1.25 MG (50000 UT) capsule, Take 1 capsule (50,000 Units total) by mouth daily., Disp: 12 capsule, Rfl: 0   Continuous Glucose Sensor (DEXCOM G7 SENSOR) MISC, USE 1 SENSOR TOPICALLY EVERY 10 DAYS, Disp: 1 each, Rfl: 11   cromolyn  (GASTROCROM ) 100 MG/5ML solution, Take 5 mLs (100 mg total) by mouth 4 (four) times daily -  before meals and at bedtime., Disp: 480 mL, Rfl: 12   cyanocobalamin  (VITAMIN B12) 1000 MCG/ML injection, Inject 1 mL (1,000 mcg total) into the muscle every 14 (fourteen) days., Disp: 6 mL, Rfl: 4   dexlansoprazole  (DEXILANT ) 60 MG capsule, Take 1 capsule (60 mg total) by mouth daily., Disp: 90 capsule, Rfl: 3   EPINEPHrine  (EPIPEN  2-PAK) 0.3 mg/0.3 mL IJ SOAJ injection, Inject 0.3 mg into the muscle as needed for anaphylaxis.,  Disp: 1 each, Rfl: 2   Erenumab -aooe (AIMOVIG ) 140 MG/ML SOAJ, Inject 140 mg as directed subcutaneously once a month, Disp: 1 mL, Rfl: 7   escitalopram  (LEXAPRO ) 10 MG tablet, Take 1 tablet (10 mg total) by mouth daily., Disp: 90 tablet, Rfl: 3   famotidine  (PEPCID ) 20 MG tablet, Take 20 mg by mouth 2 (two) times daily., Disp: , Rfl:    fenofibrate  (TRICOR ) 145 MG tablet, Take 1 tablet (145 mg total) by mouth daily., Disp: 90 tablet, Rfl: 3   folic acid  (FOLVITE ) 1 MG tablet, Take 1 tablet (1 mg total) by mouth daily., Disp: 30 tablet, Rfl: 6   glucose blood (ACCU-CHEK GUIDE TEST) test strip, Use to Check Blood Suagr, Disp: 100 each, Rfl: 2   ipratropium (ATROVENT ) 0.06 % nasal spray, Place 2 sprays into both nostrils 4 (four) times daily., Disp: 15 mL, Rfl: 12   ivabradine (CORLANOR) 5 MG TABS tablet, Take 1 tablet (5 mg total) by mouth 2 (two) times daily  with a meal., Disp: 180 tablet, Rfl: 3   Menaquinone-7 (VITAMIN K2) 100 MCG CAPS, Take 1 Dose by mouth daily., Disp: , Rfl:    minoxidil  (LONITEN ) 2.5 MG tablet, Take 1 tablet (2.5 mg total) by mouth daily., Disp: 30 tablet, Rfl: 1   omalizumab  (XOLAIR ) 150 MG/ML prefilled syringe, Inject 300 mg into the skin every 28 (twenty-eight) days., Disp: 6 mL, Rfl: 3   ondansetron  (ZOFRAN -ODT) 8 MG disintegrating tablet, Take 1 tablet (8 mg total) by mouth every 8 (eight) hours as needed for nausea or vomiting., Disp: 60 tablet, Rfl: 1   QUEtiapine  (SEROQUEL ) 300 MG tablet, Take 1 tablet (300 mg total) by mouth at bedtime., Disp: 90 tablet, Rfl: 3   Riboflavin 400 MG CAPS, Take 400 mg by mouth daily., Disp: , Rfl:    Rimegepant Sulfate (NURTEC) 75 MG TBDP, Take 1 tablet (75 mg total) by mouth as needed (Daily as needed for a Migraine). Maximum 1 tablet in 24 hours.  Quantity 8., Disp: 8 tablet, Rfl: 5   rosuvastatin  (CRESTOR ) 20 MG tablet, Take 1 tablet (20 mg total) by mouth daily., Disp: 90 tablet, Rfl: 3   Syringe/Needle, Disp, 25G X 1 1 ML MISC, 1  Dose by Does not apply route every 14 (fourteen) days., Disp: 25 each, Rfl: 0   tirzepatide  (MOUNJARO ) 15 MG/0.5ML Pen, Inject 15 mg into the skin once a week., Disp: 2 mL, Rfl: 2   topiramate  (TOPAMAX ) 100 MG tablet, Take 1 tablet (100 mg total) by mouth at bedtime., Disp: 30 tablet, Rfl: 5   traMADol  (ULTRAM ) 50 MG tablet, TAKE ONE TABLET BY MOUTH EVERY 6 HOURS AS NEEDED, Disp: 30 tablet, Rfl: 2  Observations/Objective: Patient is well-developed, well-nourished in no acute distress.  Resting comfortably  at home.  Head is normocephalic, atraumatic.  No labored breathing.  Speech is clear and coherent with logical content.  Patient is alert and oriented at baseline.    Assessment and Plan: 1. Sore throat (viral) (Primary)  2. Viral pharyngitis  Patient presenting with sore throat consistent with viral pharyngitis.    The patient did not have trismus, hot potato voice, uvula deviation, unilateral tonsillar swelling, toxic appearance, drooling or pain with movement of the trachea to suggest peritonsillar abscess or epiglottitis.  No evidence of bacterial infections including peritonsillar abscess, retropharyngeal abscess, epiglottitis.  Rapid strep was obtained and was negative.  Patient advised to continue ibuprofen and Tylenol  at home.  Further symptomatic measures discussed.  Advised patient on supportive therapies, including using a cool-mist vaporizer/humidifer/steam from hot showers, limit talking, OTC throat lozenges and mouthwashes, gargling w/ warm saltwater, advancement of fluids as tolerated, nasal saline sprays, rest, OTC acetaminophen  or ibuprofen as directed prn for pain control, frequent handwashing, and boiling/disposing of contaminated toothbrushes.  Follow Up Instructions: I discussed the assessment and treatment plan with the patient. The patient was provided an opportunity to ask questions and all were answered. The patient agreed with the plan and demonstrated an  understanding of the instructions.  A copy of instructions were sent to the patient via MyChart unless otherwise noted below.     The patient was advised to call back or seek an in-person evaluation if the symptoms worsen or if the condition fails to improve as anticipated.    Teena Shuck, PA-C

## 2024-01-05 NOTE — Progress Notes (Signed)
 Error, duplicate encounter

## 2024-01-05 NOTE — Telephone Encounter (Signed)
 This encounter was created in error - please disregard.

## 2024-01-05 NOTE — Patient Instructions (Signed)
 Amber [name], thank you for joining Teena Shuck, PA-C for today's virtual visit.  While this provider is not your primary care provider (PCP), if your PCP is located in our provider database this encounter information will be shared with them immediately following your visit.   A [facility] MyChart account gives you access to today's visit and all your visits, tests, and labs performed at [facility]  click here if you don't have a [facility] MyChart account or go to [email or web address]  Consent: (Patient) Amber [name] provided verbal consent for this virtual visit at the beginning of the encounter.  Current Medications:  Current Outpatient Medications:    cabergoline  (DOSTINEX ) 0.5 MG tablet, 2.5 mg weekly (Two tabs on Mondays, 1 tab on wednesdays and 2 tabs on Fridays), Disp: 65 tablet, Rfl: 3   cetirizine (ZYRTEC) 10 MG chewable tablet, Chew 10 mg by mouth in the morning and at bedtime., Disp: , Rfl:    Cholecalciferol (TRUE VITAMIN D3) 1.25 MG (50000 UT) capsule, Take 1 capsule (50,000 Units total) by mouth daily., Disp: 12 capsule, Rfl: 0   Continuous Glucose Sensor (DEXCOM G7 SENSOR) MISC, USE 1 SENSOR TOPICALLY EVERY 10 DAYS, Disp: 1 each, Rfl: 11   cromolyn  (GASTROCROM ) 100 MG/5ML solution, Take 5 mLs (100 mg total) by mouth 4 (four) times daily -  before meals and at bedtime., Disp: 480 mL, Rfl: 12   cyanocobalamin  (VITAMIN B12) 1000 MCG/ML injection, Inject 1 mL (1,000 mcg total) into the muscle every 14 (fourteen) days., Disp: 6 mL, Rfl: 4   dexlansoprazole  (DEXILANT ) 60 MG capsule, Take 1 capsule (60 mg total) by mouth daily., Disp: 90 capsule, Rfl: 3   EPINEPHrine  (EPIPEN  2-PAK) 0.3 mg/0.3 mL IJ SOAJ injection, Inject 0.3 mg into the muscle as needed for anaphylaxis., Disp: 1 each, Rfl: 2   Erenumab -aooe (AIMOVIG ) 140 MG/ML SOAJ, Inject 140 mg as directed subcutaneously once a month, Disp: 1 mL, Rfl: 7   escitalopram  (LEXAPRO ) 10 MG tablet, Take 1  tablet (10 mg total) by mouth daily., Disp: 90 tablet, Rfl: 3   famotidine  (PEPCID ) 20 MG tablet, Take 20 mg by mouth 2 (two) times daily., Disp: , Rfl:    fenofibrate  (TRICOR ) 145 MG tablet, Take 1 tablet (145 mg total) by mouth daily., Disp: 90 tablet, Rfl: 3   folic acid  (FOLVITE ) 1 MG tablet, Take 1 tablet (1 mg total) by mouth daily., Disp: 30 tablet, Rfl: 6   glucose blood (ACCU-CHEK GUIDE TEST) test strip, Use to Check Blood Suagr, Disp: 100 each, Rfl: 2   ipratropium (ATROVENT ) 0.06 % nasal spray, Place 2 sprays into both nostrils 4 (four) times daily., Disp: 15 mL, Rfl: 12   ivabradine (CORLANOR) 5 MG TABS tablet, Take 1 tablet (5 mg total) by mouth 2 (two) times daily with a meal., Disp: 180 tablet, Rfl: 3   Menaquinone-7 (VITAMIN K2) 100 MCG CAPS, Take 1 Dose by mouth daily., Disp: , Rfl:    minoxidil  (LONITEN ) 2.5 MG tablet, Take 1 tablet (2.5 mg total) by mouth daily., Disp: 30 tablet, Rfl: 1   omalizumab  (XOLAIR ) 150 MG/ML prefilled syringe, Inject 300 mg into the skin every 28 (twenty-eight) days., Disp: 6 mL, Rfl: 3   ondansetron  (ZOFRAN -ODT) 8 MG disintegrating tablet, Take 1 tablet (8 mg total) by mouth every 8 (eight) hours as needed for nausea or vomiting., Disp: 60 tablet, Rfl: 1   QUEtiapine  (SEROQUEL ) 300 MG tablet, Take 1 tablet (300 mg total) by mouth at bedtime., Disp: 90  tablet, Rfl: 3   Riboflavin 400 MG CAPS, Take 400 mg by mouth daily., Disp: , Rfl:    Rimegepant Sulfate (NURTEC) 75 MG TBDP, Take 1 tablet (75 mg total) by mouth as needed (Daily as needed for a Migraine). Maximum 1 tablet in 24 hours.  Quantity 8., Disp: 8 tablet, Rfl: 5   rosuvastatin  (CRESTOR ) 20 MG tablet, Take 1 tablet (20 mg total) by mouth daily., Disp: 90 tablet, Rfl: 3   Syringe/Needle, Disp, 25G X 1 1 ML MISC, 1 Dose by Does not apply route every 14 (fourteen) days., Disp: 25 each, Rfl: 0   tirzepatide  (MOUNJARO ) 15 MG/0.5ML Pen, Inject 15 mg into the skin once a week., Disp: 2 mL, Rfl: 2    topiramate  (TOPAMAX ) 100 MG tablet, Take 1 tablet (100 mg total) by mouth at bedtime., Disp: 30 tablet, Rfl: 5   traMADol  (ULTRAM ) 50 MG tablet, TAKE ONE TABLET BY MOUTH EVERY 6 HOURS AS NEEDED, Disp: 30 tablet, Rfl: 2   Medications ordered in this encounter:  No orders of the defined types were placed in this encounter.    *If you need refills on other medications prior to your next appointment, please contact your pharmacy*  Follow-Up: Call back or seek an in-person evaluation if the symptoms worsen or if the condition fails to improve as anticipated.  [facility] [phone]  Other Instructions Please report to the nearest Emergency room with any worsening symptoms. Follow up with primary care provider (PCP) in 2 -3 days.    If you have been instructed to have an in-person evaluation today at a local Urgent Care facility, please use the link below. It will take you to a list of all of our available [facility], including address, phone number and hours of operation. Please do not delay care.  [facility]  If you or a family member do not have a primary care provider, use the link below to schedule a visit and establish care. When you choose a [facility] primary care physician or advanced practice provider, you gain a long-term partner in health. Find a Primary Care Provider  Learn more about [facility] in-office and virtual care options: [facility] - Get Care Now

## 2024-01-05 NOTE — Patient Instructions (Signed)
 Medication Instructions:  Your physician has recommended you make the following change in your medication:  1) START taking Corlanor (ivabridine) 5 mg twice daily *If you need a refill on your cardiac medications before your next appointment, please call your pharmacy*  Follow-Up: At [facility], you and your health needs are our priority.  As part of our continuing mission to provide you with exceptional heart care, our providers are all part of one team.  This team includes your primary Cardiologist (physician) and Advanced Practice Providers or APPs (Physician Assistants and Nurse Practitioners) who all work together to provide you with the care you need, when you need it.  Your next appointment:   6 months  Provider:   You may see Will Gladis Norton, MD or one of the following Advanced Practice Providers on your designated Care Team:   Charlies Arthur, [place] Ozell Jodie Passey, PA-C Suzann Riddle, NP Daphne Barrack, NP Artist Pouch, PA-C

## 2024-01-09 ENCOUNTER — Ambulatory Visit (INDEPENDENT_AMBULATORY_CARE_PROVIDER_SITE_OTHER): Admitting: Psychology

## 2024-01-09 ENCOUNTER — Encounter: Payer: Self-pay | Admitting: Psychology

## 2024-01-09 DIAGNOSIS — F423 Hoarding disorder: Secondary | ICD-10-CM | POA: Diagnosis not present

## 2024-01-09 DIAGNOSIS — F411 Generalized anxiety disorder: Secondary | ICD-10-CM

## 2024-01-09 NOTE — Progress Notes (Signed)
   Amber Dames, PhD

## 2024-01-09 NOTE — Progress Notes (Signed)
 [facility] Counselor/Therapist Progress Note  Patient ID: Amber [name], MRN: [id number],    Date: 01/09/2024  Time Spent: 12:30 - 2:30 pm   Treatment Type: Testing  Met with patient for testing session.  Patient was at the clinic and session was conducted from therapist's office in person.    Reported Symptoms/Reason for Referral: Patient presents with a history of social interaction difficulty since childhood, along with atypical behavior including overly intense interests, problems coping with change/transition, and sensory hypersensitivity resulting in a suspicion of Autism spectrum Disorder (ASD). She has been previously diagnosed with generalized anxiety, agoraphobia, and OCD while several medical conditions and childhood emotional distress complicate her developmental picture. Testing recommended to evaluate for ASD along with other conditions that may be affecting social interaction and behavior.   Mental Status Exam: Appearance:  Casual and Fairly Groomed     Behavior: Appropriate  Motor: Normal  Speech/Language:  Clear and Coherent and Normal Rate  Affect: Constricted  Mood: euthymic  Thought process: normal  Thought content:   WNL  Sensory/Perceptual disturbances:   WNL  Orientation: oriented to person, place, time/date, and situation  Attention: Good  Concentration: Good  Memory: WNL  Fund of knowledge:  Good  Insight:   Good  Judgment:  Good  Impulse Control: Good   Risk Assessment: Danger to Self:  No Self-injurious Behavior: No Danger to Others: No Duty to Warn:no Physical Aggression / Violence:No   Subjective: Testing included the K-BIT 2R (0.75 hrs. for testing and scoring) along with the CNS Vital signs (0.75 hrs.), BRIEF-2A (0.25 hrs.), and CAARS-2 (0.25 hrs.).    Patient was cooperative and displayed good effort. Attention and concentration were good overall, as patient exhibited only one instance of self-correction, asking questions  to be repeated, and missing relatively easy problems or questions.  Mood was euthymic with constricted affect.  The results appear representative of current functioning.    Total time for testing: 2.0 hrs.  Diagnosis:Generalized anxiety disorder  Diagnosis:Generalized anxiety disorder  Hoarding disorder  Plan: Testing to continue next session with the ADOS 2 Module 4 and SRS-2 (S & O) followed by report writing and interactive feedback.     Zyree Traynham, PhD

## 2024-01-10 ENCOUNTER — Encounter: Payer: Self-pay | Admitting: Psychology

## 2024-01-10 ENCOUNTER — Ambulatory Visit: Admitting: Psychology

## 2024-01-10 DIAGNOSIS — F411 Generalized anxiety disorder: Secondary | ICD-10-CM | POA: Diagnosis not present

## 2024-01-10 NOTE — Progress Notes (Signed)
  Behavioral Health Counselor/Therapist Progress Note  Patient ID: Amber [name], MRN: [id number],    Date: 01/10/2024  Time Spent: 12:30 - 2:30 pm   Treatment Type: Testing  Met with patient for testing session.  Patient was at the clinic and session was conducted from therapist's office in person.    Reported Symptoms/Reason for Referral: Patient presents with a history of social interaction difficulty since childhood, along with atypical behavior including overly intense interests, problems coping with change/transition, and sensory hypersensitivity resulting in a suspicion of Autism spectrum Disorder (ASD). She has been previously diagnosed with generalized anxiety, agoraphobia, and OCD while several medical conditions and childhood emotional distress complicate her developmental picture. Testing recommended to evaluate for ASD along with other conditions that may be affecting social interaction and behavior.   Mental Status Exam: Appearance:  Casual and Fairly Groomed     Behavior: Appropriate  Motor: Normal  Speech/Language:  Clear and Coherent and Normal Rate  Affect: Mildly constricted  Mood: euthymic  Thought process: normal  Thought content:   WNL  Sensory/Perceptual disturbances:   WNL  Orientation: oriented to person, place, time/date, and situation  Attention: Good  Concentration: Good  Memory: WNL  Fund of knowledge:  Good  Insight:   Good  Judgment:  Good  Impulse Control: Good   Risk Assessment: Danger to Self:  No Self-injurious Behavior: No Danger to Others: No Duty to Warn:no Physical Aggression / Violence:No   Subjective: Testing included the ADOS 2 Module 4 (1.75 hrs. for testing and scoring) along with the SRS-2 Self Report (0.25 hrs.).  The SRS-2 was also sent to patient's husband to complete online.  Patient was cooperative and displayed good effort. Attention and concentration were good overall, as patient exhibited only one instance of  self-correction, asking questions to be repeated, and missing relatively easy problems or questions.  Mood was euthymic with constricted affect.  Patient did not initiate social interaction and her responses to social initiations from the examiner were limited to comments about personal experiences.  The results appear representative of current functioning.    Total time for testing: 2.0 hrs.  Diagnosis:Generalized anxiety disorder  Plan: Testing complete. Report writing to be conducted followed by interactive feedback next session.    Allysha Tryon, PhD                  Mike Berntsen, PhD

## 2024-01-15 ENCOUNTER — Ambulatory Visit: Admitting: Physical Therapy

## 2024-01-15 ENCOUNTER — Encounter: Payer: Self-pay | Admitting: Psychology

## 2024-01-15 NOTE — Progress Notes (Signed)
 Amber [name] is a 43 y.o. female patient Report writing competed ( 3 hrs.).  Interactive feedback to be conducted next session. Report to be attached to the feedback progress note.  Patient/Guardian was advised Release of Information must be obtained prior to any record release in order to collaborate their care with an outside provider. Patient/Guardian was advised if they have not already done so to contact the registration department to sign all necessary forms in order for us  to release information regarding their care.   Consent: Patient/Guardian gives verbal consent for treatment and assignment of benefits for services provided during this visit. Patient/Guardian expressed understanding and agreed to proceed.    [name], PhD

## 2024-01-16 ENCOUNTER — Encounter: Payer: Self-pay | Admitting: Psychology

## 2024-01-16 ENCOUNTER — Ambulatory Visit (INDEPENDENT_AMBULATORY_CARE_PROVIDER_SITE_OTHER): Admitting: Psychology

## 2024-01-16 DIAGNOSIS — F411 Generalized anxiety disorder: Secondary | ICD-10-CM | POA: Diagnosis not present

## 2024-01-16 DIAGNOSIS — F840 Autistic disorder: Secondary | ICD-10-CM | POA: Diagnosis not present

## 2024-01-16 DIAGNOSIS — F429 Obsessive-compulsive disorder, unspecified: Secondary | ICD-10-CM

## 2024-01-16 DIAGNOSIS — F422 Mixed obsessional thoughts and acts: Secondary | ICD-10-CM

## 2024-01-16 NOTE — Progress Notes (Signed)
 SABRA

## 2024-01-16 NOTE — Progress Notes (Signed)
 [facility] Counselor/Therapist Progress Note  Patient ID: Amber [name], MRN: [id number],    Date: 01/16/2024  Time Spent: 7:00 - 7:40 pm   Treatment Type: Testing - Feedback Session  Met with patient to review results of testing.  Patient was at home and session was conducted from therapist's office via video conferencing.  Patient understood the limitations of video appointments and verbally consented to telehealth.       Reported Symptoms:  Patient presents with a history of social interaction difficulty since childhood, along with atypical behavior including overly intense interests, problems coping with change/transition, and sensory hypersensitivity resulting in a suspicion of Autism spectrum Disorder (ASD). She has been previously diagnosed with generalized anxiety, agoraphobia, and OCD while several medical conditions and childhood emotional distress complicate her developmental picture. Testing recommended to evaluate for ASD along with other conditions that may be affecting social interaction and behavior.   Subjective: Interactive feedback was conducted (1 hr.).  It was discussed how patient met the criterion for Autism Spectrum Disorder and OCD along with how these conditions affect her ability to focus, regulate her emotion, and relate to others.      Recommendations included discussing results with PCP, developing a visual organization system, resuming individual and group counseling, and seeking appropriate support services.  Patient expressed agreement with the results and recommendations.     Total Time: 4 hrs. Interactive Feedback:1 hr. Report Writing: 3 hrs.   Diagnosis: Autism Spectrum Disorder - Level 1 - Needs Support Obsessive Compulsive Disorder  Generalized Anxiety Disorder   Plan: Report to be sent to parent and referring provider.     Elodie Panameno, PhD

## 2024-01-16 NOTE — Progress Notes (Signed)
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 has been previously diagnosed with generalized anxiety, agoraphobia, and OCD while several medical conditions and childhood emotional distress complicate her developmental picture. Testing was recommended to evaluate for ASD along with other conditions that may be affecting social interaction and behavior.  Test results indicated average overall comprehension (K-BIT 2R), with slightly better visual reasoning than language comprehension, although both were age typical.  Testing for neurocognitive processing indicated below age typical overall processing with average attention for simple and complex activities but slow to very slow motor speed, processing speed, and reaction time.  They may be related to her medical conditions.  Self-report ratings for execution function (BRIEF 2A) indicated severely impaired development overall with a highly elevated scores regarding behavior, emotion, and cognitive regulation.  Self-report ratings of  behavior and functioning (CAARS-2) indicated severe  impairment regarding inattention/executive dysfunction, hyperactivity, and emotion regulation, with a moderately high elevation regarding impulsivity.  Other self-report ratings indicated high levels of obsessive-compulsive symptoms and anxiety with mild depression and traumatic stress symptoms (not current per Ms. Mccolgan).  Direct testing for ASD indicated overall difficulty with reciprocal social interaction and nonverbal communication, with a few instances of restricted repetitive behavior observed during testing.  The overall score was within art-risk levels.  However, Ms. Labell reported severely high social interaction impairment and atypical behavior on rating scales as did her husband.  The results suggest that Ms. Frenette meets the behavioral criteria for ASD and ADHD, although perceived attention deficits seem more related to ASD and obsessive-compulsive symptoms, as direct testing for attention indicated adequate focus outside of executive function deficits.  Her anxiety seems to be generalized and more than just adjusting to her medical conditions while symptoms of depression and trauma appear currently controlled through medication.  See below for recommendations.          Diagnostic Impression: DSM 5 Autism Spectrum Disorder - Level 1 - Needs Support Obsessive Compulsive Disorder  Generalized Anxiety Disorder   Recommendations: Referral back to primary care. Consider medication for managing anxiety should anxiety symptoms interfere with functioning.  Medication is not recommended for attention deficits, as ADHD symptoms seem more autism spectrum and OCD related (focusing on extraneous details and over thinking).  Additionally, the high arousal from stimulant medication could trigger an anxiety or trauma response.   Individual and/or group counseling is recommended to resume, related to understanding how ASD affects her behavior and emotional functioning along with gaining adequate coping  and behavior/emotion regulation skills.  Online group therapy in Dialectical Behavior Therapy (DBT) for individuals with ASD is offered through [facility].  DBT teaches individuals with intense emotion regulation difficulty appropriate coping skills through mindfulness and other techniques.  See below for individual therapy resources.   Socially, Ms. Crayton would benefit from participation in social activity like clubs or interest groups that are small and structured along with having clear rules, guidelines, and activities. It is recommended that Ms. Tsui continue to receive Social Security Disability Income, although consultation with the Employment for Individuals with Disabilities (former Vocational Rehabilitation) may be helpful in assisting Ms. 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         Elspeth MOTE Sheena Simonis, Ph.D. Licensed Psychologist - [facility] [phone]

## 2024-01-18 ENCOUNTER — Inpatient Hospital Stay

## 2024-01-22 ENCOUNTER — Ambulatory Visit: Attending: Family Medicine

## 2024-01-22 ENCOUNTER — Encounter: Payer: Self-pay | Admitting: Radiology

## 2024-01-22 NOTE — Therapy (Incomplete)
 OUTPATIENT PHYSICAL THERAPY TREATMENT  Patient Name: Amber [name] MRN: [id number] DOB:Jul 07, 1980, 43 y.o., female Today's Date: 01/22/2024  END OF SESSION:      Past Medical History:  Diagnosis Date   Anxiety    Asthma    Depression    Diabetes (HCC)    GERD (gastroesophageal reflux disease)    History of chicken pox    History of migraine headaches    Migraines    Pituitary adenoma (HCC)    POTS (postural orthostatic tachycardia syndrome)    Seizures (HCC)    Past Surgical History:  Procedure Laterality Date   APPENDECTOMY     CHOLECYSTECTOMY     ESOPHAGEAL MANOMETRY N/A 07/12/2023   Procedure: MANOMETRY, ESOPHAGUS;  Surgeon: San Sandor GAILS, DO;  Location: [facility];  Service: Gastroenterology;  Laterality: N/A;   PH IMPEDANCE STUDY N/A 07/12/2023   Procedure: IMPEDANCE PH STUDY, ESOPHAGUS;  Surgeon: San Sandor GAILS, DO;  Location: [facility];  Service: Gastroenterology;  Laterality: N/A;   Pituiary Tumor     Tumor Removal Pituitary   Patient Active Problem List   Diagnosis Date Noted   EDS (Ehlers-Danlos syndrome) 09/20/2023   Dysautonomia (HCC) 09/20/2023   Bilateral hand swelling 07/25/2023   Palpitations 03/13/2023   Paroxysmal tachycardia (HCC) 03/13/2023   Mixed hyperlipidemia 02/14/2023   Iron  deficiency anemia 01/13/2023   Type 2 diabetes mellitus with hyperglycemia, without long-term current use of insulin  (HCC) 12/28/2022   Asthma 03/23/2021   Hypertriglyceridemia 11/10/2020   Myofascial pain 05/07/2020   Multiple closed fractures of ribs of right side 05/07/2020   Numbness of toes 05/07/2020   Prediabetes 04/28/2020   Pituitary microadenoma (HCC) 04/28/2020   Prolactinoma (HCC) 04/28/2020   Pre-diabetes 12/25/2019   Infertility counseling 12/24/2019   Well woman exam 12/24/2019   Hyperglycemia 06/05/2019   Insomnia 01/07/2019   Bilateral hand pain 11/09/2018   Agoraphobia 01/18/2018   Anxiety with depression 03/24/2017    Migraine 05/11/2016   Pituitary adenoma (HCC) 03/05/2016   Hyperprolactinemia 03/02/2016    PCP: Frann Mabel Mt DO  REFERRING PROVIDER: Joane Birmingham MD   REFERRING DIAG: 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  ONSET DATE: chronic 5+   SUBJECTIVE:                                                                                                                                                                                           SUBJECTIVE STATEMENT: ***  EVAL: Pt reports being worked up for about 5 yrs due to hand issues but does recall subluxing hips  as a child.  Primary MD gave her some exercises. She has difficulty with stability and balance during functional activities at home.  Since losing wgt (95 lbs) she has had more joint pain.  Seeing Neuro soon for workup for tingling in feet and hands.  She has difficulty standing and walking (avoids shopping), activity in general.  She does walk the dogs every other day (1/2 mile).  Someday she can't make it the full time.  She has difficulty with lifting, hand function and gripping.  Compression gloves at night. She has cubital tunnel elbow braces but she does not wear them much.  She did OT for only 1 visit but did not return, was recommended to follow up about the hypermobility in her hands at that time.   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 about 1.5 yrs    Has the patient been formally diagnosed with EDS or HSD? If yes indicate the type EDS-type III recent diagnosis    If no ,does the patient feel they may have EDS or HSD?  Patient reports symptoms and/or instability in the following areas:  - Cervical Spine: [x] Pain [] Instability [] Limited ROM [] Paresthesia  - Thoracic Spine: [] Pain [] Instability  - Lumbar Spine: [x] Pain [] Instability [] Frequent locking/giving out  - Shoulder: [] L [x] R   [x] Subluxation [] Dislocations [x] Pain [] Fatigue  - Elbow: [] L [] R  [] Instability [] Hyperextension [] Pain  - Wrist/Hand: [x] L [x] R  [x] Instability [] Fatigue with use [x] Pain  - Hip: [x] L [x] R  [x] Instability [x] Pain [x] Clicking [] Gait deviations  - Knee: [x] L [x] R  [x] Instability [x] Hyperextension [x] Pain  - Ankle/Foot: [x] L [x] R  [x] Instability [] Frequent sprains Pain , Lt. 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, husband is disabled and she helps him with more executive functioning activities ____________________________   Patient-reported Beighton Score (if known): _____8__/9  Clinician-assessed Beighton Score (today): _____8__/9     PERTINENT HISTORY:  MD note:    MS: bilat hip pain w/ sublux in middle school, bilat hand pain and swelling, bilat shoulder instability R>L Skin/Immune reactions: stretchy skin, chronic urticaria Nervous system: dizziness esp w/ transitioning positions Head/Spine: chronic migraines, insomnia CV: tachycardia when transitioning to stand GI: GERD, chronic diarrhea, intermittent constipation , Genitourinary: none Hands & Feet: swell, paresthesia bilat hands and feet   PAIN:  Are you having pain?  Yes: NPRS scale: 7/10 Pain location: hips lateral and groin Pain description: sore and achy  Aggravating factors: sitting too long Relieving factors: pretzel, Tylenol , heating pad  Are you having pain?  Yes: NPRS scale: 5/10  Pain location: Rt shoulder  Pain description: moving around a lot  Aggravating factors: overuse , handling dog  Relieving factors: heating pad    PRECAUTIONS: None  RED FLAGS: Monitor syncope   WEIGHT BEARING  RESTRICTIONS: No  FALLS:  Has patient fallen in last 6 months? 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  DIAGNOSTIC FINDINGS:  none  CARDIO/ORTHOSTATICS: Baseline RHR 99 Standing HR 125  Baseline BP 90/62 Standing BP 101/53 min dizziness    PATIENT SURVEYS:  Quick Dash:  QUICK DASH  Please rate your ability do the following activities in the last week by selecting the number below the appropriate response.   Activities Rating  Open a tight or new jar.  3 = Moderate difficulty  Do heavy household chores (e.g., wash walls, floors). 3 = Moderate difficulty  Carry a shopping bag or briefcase 4 = Severe difficulty  Wash your back. 2 = Mild difficulty  Use a knife to cut food. 2 = Mild difficulty  Recreational activities in which you take some force or impact through your arm, shoulder or hand (e.g., golf, hammering, tennis, etc.). 3 = Moderate difficulty  During the past week, to what extent has your arm, shoulder or hand problem interfered with your normal social activities with family, friends, neighbors or groups?  3 = Moderately  During the past week, were you limited in your work or other regular daily activities as a result of your arm, shoulder or hand problem? 3 = Moderately limited  During the past week, were you limited in your work or other regular daily activities as a result of your arm,  shoulder or hand problem? 3 = Moderate  Tingling (pins and needles) in your arm, shoulder or hand. 3 = Moderate  During the past week, how much difficulty have you had sleeping because of the pain in your arm, shoulder or hand?  3 = Moderate difficulty   (A QuickDASH score may not be calculated if there is greater than 1 missing item.)  Quick Dash Disability/Symptom Score: [(sum of 32 (n) responses/11 (n)] x 25 = 47% able   Minimally Clinically Important Difference (MCID): 15-20 points  (Franchignoni, F. et al. (2013). Minimally clinically important difference of the disabilities of the arm, shoulder, and hand outcome measures (DASH) and its shortened version (Quick DASH). Journal of Orthopaedic & Sports Physical Therapy, 44(1), 30-39)   COGNITION: Overall cognitive status: Within functional limits for tasks assessed     SENSATION: Feet tingling at frequently , some N/T in L elbow from surgery    POSTURE: rounded shoulders, forward head, increased thoracic kyphosis, and posterior pelvic tilt  PALPATION: Sore to palpation Rt shoulder grossly  Skin is extensible and very smooth soft    LUMBAR ROM:   AROM eval  Flexion Palms flat   Extension   Right lateral flexion   Left lateral flexion   Right rotation   Left rotation    (Blank rows = not tested)  LOWER EXTREMITY ROM:  PROM WNL, hypermobile IR >ER   LOWER EXTREMITY MMT:    MMT Right eval Left eval  Hip flexion 4+ 4+  Hip extension    Hip abduction    Hip adduction    Hip internal rotation    Hip external rotation    Knee flexion 5 5  Knee extension 5 5  Ankle dorsiflexion    Ankle plantarflexion    Ankle inversion    Ankle eversion     (Blank rows = not tested)  LUMBAR SPECIAL TESTS:  NT   CERVICAL ROM:  NT on eval  Active ROM A/PROM (deg) 01/22/2024  Flexion   Extension  Right lateral flexion   Left lateral flexion   Right rotation   Left rotation    (Blank rows = not tested)  UE ROM: WNL   Pain limits Rt shoulder combo ER/ and abduction  UE MMT:  MMT Right eval Left eval  Shoulder flexion 4-pain  4  Shoulder extension    Shoulder abduction 4-pain  4  Shoulder adduction    Shoulder extension    Shoulder internal rotation 4 pain  4+  Shoulder external rotation 4 pain  4+  Middle trapezius    Lower trapezius    Elbow flexion 4 4+  Elbow extension 4 4+  Wrist flexion    Wrist extension    Wrist ulnar deviation    Wrist radial deviation    Wrist pronation    Wrist supination    Grip strength     (Blank rows = not tested)  CERVICAL SPECIAL TESTS:  NT   FUNCTIONAL TESTS:  5 times sit to stand: 18.2 sec   30 seconds chair stand test 9 reps  2WMT: 358ft HR -     Beighton Scale Lumbar (_1/1) Knees (_1/2) Elbows (_2/2)  5th digit (_2/2) Thumb (_2/2)  8/9: knee hyper extension  5 deg on Rt and 10 on Lt   TREATMENT: [facility] Adult PT Treatment:                                                DATE: 01/22/2024 Hook-lying PPT x 10 - 5 hold Supine bilateral ER 2x10 GTB Supine horizontal abd 2x15 GTB PPT into bridge 3x10 with pilates ring Pilates SLR 2x10 ea PPT with pilates ring x 15 S/L hip abd 2x10 ea  [facility] Adult PT Treatment:                                                DATE: 01/01/2024 Hook-lying PPT x 10 - 5 hold Supine bilateral ER 2x10 GTB Supine horizontal abd 2x15 GTB PPT into bridge 3x10 with pilates ring Pilates SLR 2x10 ea PPT with pilates ring x 15 S/L hip abd 2x10 ea  [facility] Adult PT Treatment:                                                DATE: 11/16/2023 Assessment of tests/measures, goals, and outcomes Hook-lying PPT x 10 - 5 hold PPT into bridge 2x15 with pilates ring Pilates SLR 2x10 ea Supine R shoulder flex 2x10 3# Standing row 3x12 blue band Standing ER 2x10 YTB Shoulder ext with scap retraction 2x15 GTB  [facility] Adult PT Treatment:                                                DATE: 11/14/2023 NuStep lvl 5 UE/LE x 4 min for  activity tolerance while monitoring HR  Hook-lying PPT x 10 - 5 hold Hook-lying PPT with pilates add 2x10 PPT into bridge 2x10 with pilates ring Pilates SLR 2x10 ea S/L  hip abd 2x10 ea Seated bilateral ER 2x10 RTB R shoulder flexion 2x10 3#  [facility] Adult PT Treatment:                                                DATE: 11/09/2023 NuStep lvl 4 UE/LE x 4 min for activity tolerance while monitoring HR  Supine PPT x 10 - 5 hold PPT into bridge 2x10 with pilates ring Pilates SLR 2x10 ea PPT with shoulder pulldown 3x10 BTB Pilates ring horizontal abd 2x10 with elevation to 90 deg ER isometric walkout x 10 YTB ea shoulder Standing row 2x12 blue band  [facility] Adult PT Treatment:                                                DATE: 10/31/2023 NuStep lvl 3 LE only x 4 min for activity tolerance while monitoring HR Supine PPT x 10 - 5 hold Supine PPT with ball 2x10 - 5 hold Pilates SLR 2x10 ea PPT into bridge 2x10 with pilates ring PPT with shoulder pulldown 2x10 GTB Supine R shoulder flexion 2x10 2# S/L R shoulder ER 2x10 2# Seated horizontal abd 2x10 RTB  [facility] Adult PT Treatment:                                                DATE: 10/12/2023 - 324ft Supine PPT x 10 - 5 hold PPT into bridge 2x10 S/L clamshell 2x10 RTB Seated scapular retraction x 10 - 5 hold R shoulder ER isometric x 10 - 5 hold Seated bilateral ER RTB 3x10  PATIENT EDUCATION:  Education details:  Posture, alignment, neutral zone and joint protection PNE/Explain pain    Joint inflammation/collagen/ligament laxity, muscular support Stretching vs stabilizing  Person educated: Patient Education method: Explanation Education comprehension: verbalized understanding   HOME EXERCISE PROGRAM: Access Code: [email or web address] URL: [email or web address] Date: 01/01/2024 Prepared by: Alm Kingdom  Exercises - Supine Posterior Pelvic Tilt  - 1 x daily - 7 x weekly - 2 sets - 10 reps - 5 sec hold - Supine  Bridge  - 1 x daily - 7 x weekly - 2 sets - 10 reps - Clamshell with Resistance  - 1 x daily - 7 x weekly - 2 sets - 15 reps - red band hold - Supine Shoulder Horizontal Abduction with Resistance  - 1 x daily - 7 x weekly - 2-3 sets - 15 reps - green band hold - Sidelying Hip Abduction  - 1 x daily - 7 x weekly - 3 sets - 15 reps - Shoulder External Rotation and Scapular Retraction with Resistance  - 1 x daily - 7 x weekly - 3 sets - 10 reps - green band (supine) hold - Supine Shoulder Flexion Extension Full Range AROM  - 1 x daily - 7 x weekly - 2 sets - 10 reps - 2lbs hold - Supine 90/90 Abdominal Bracing  - 1 x daily - 7 x weekly - 2-3 reps - 15 sec hold  ASSESSMENT:  CLINICAL IMPRESSION: *** Pt was able to complete prescribed exercises with no adverse effect.  Today we stayed in supine with continued emphasis on core stability strengthening and periscapular strength. HEP was updated to reflect change in treatment approach to stay mostly supine in order to mitigate POTS symptoms. Pt continues to benefit form skilled PT services and should continued to be seen and progressed as able per POC. Will decrease frequency to 1x/wk for [redacted] weeks along with HEP compliance and continued symptom management.   OBJECTIVE IMPAIRMENTS: cardiopulmonary status limiting activity, decreased activity tolerance, decreased coordination, decreased endurance, decreased mobility, difficulty walking, decreased ROM, decreased strength, impaired UE functional use, postural dysfunction, obesity, and pain.   ACTIVITY LIMITATIONS: carrying, lifting, bending, sitting, standing, squatting, sleeping, stairs, transfers, reach over head, locomotion level, and caring for others  PARTICIPATION LIMITATIONS: meal prep, cleaning, laundry, interpersonal relationship, shopping, and community activity  PERSONAL FACTORS: Past/current experiences, Time since onset of injury/illness/exacerbation, and 3+ comorbidities: dysautonomia/syncope,  MCAS, L elbow post surgery  are also affecting patient's functional outcome.   REHAB POTENTIAL: Excellent  CLINICAL DECISION MAKING: Evolving/moderate complexity  EVALUATION COMPLEXITY: Moderate   GOALS: Goals reviewed with patient? Yes  SHORT TERM GOALS: Target date: 11/01/2023   Patient will be able to show independence for initial HEP to include posture, core and hip strength and stability.   Baseline: Goal status: MET  2.    Patient will be able to complete functional testing for endurance, balance and strength. Baseline:  Goal status:MET  3.  Pt will report improved awareness of core and posture when performing ADLs  Baseline:  Goal status: IN PROGRESS   LONG TERM GOALS: Target date: 02/12/2024   Patient will be independent with final HEP upon discharge from PT and report consistent benefit following exercise completion.    Baseline:  Goal status: INITIAL  2.  Pt will improve 2 min walk test distance by 50 feet or more with LRAD and no increase in pain.   Baseline: 355 11/16/23: 344ft Goal status: IN PROGRESS  3.  Patient will be I with concepts of joint protection and stability as it pertains to joint hypermobility. Baseline:  Goal status: INITIAL  4.  Pt will be able to demonstrate good standing balance as evidenced by balance recovery from min to mod dynamic balance challenges.   Baseline:  Goal status: INITIAL  5.  Pt will be able to consistently walk dogs 1/2 mile without undue fatigue and pain < 2/10. Baseline:  Goal status: INITIAL    PLAN:  PT FREQUENCY: 1x/week  PT DURATION: 6 weeks  PLANNED INTERVENTIONS: 97164- PT Re-evaluation, 97750- Physical Performance Testing, 97110-Therapeutic exercises, 97530- Therapeutic activity, [id number]- Neuromuscular re-education, 97535- Self Care, [id number]- Manual therapy, 20560 (1-2 muscles), 20561 (3+ muscles)- Dry Needling, Patient/Family education, Balance training, Taping, Cryotherapy, and Moist heat.  PLAN FOR  NEXT SESSION: establish HEP and complete 2 min walk, check BP.HR . Focus on stability training and core control    Alm JAYSON Kingdom, PT 01/22/2024, 8:23 AM

## 2024-01-23 ENCOUNTER — Ambulatory Visit: Admitting: Family Medicine

## 2024-01-23 ENCOUNTER — Encounter: Payer: Self-pay | Admitting: Family Medicine

## 2024-01-23 VITALS — BP 110/78 | HR 95 | Temp 98.0°F | Resp 16 | Ht 66.0 in | Wt 183.0 lb

## 2024-01-23 DIAGNOSIS — F411 Generalized anxiety disorder: Secondary | ICD-10-CM | POA: Diagnosis not present

## 2024-01-23 DIAGNOSIS — F410 Panic disorder [episodic paroxysmal anxiety] without agoraphobia: Secondary | ICD-10-CM | POA: Diagnosis not present

## 2024-01-23 MED ORDER — BUSPIRONE HCL 7.5 MG PO TABS: ORAL_TABLET | ORAL | 2 refills | Status: AC

## 2024-01-23 MED ORDER — ESCITALOPRAM OXALATE 20 MG PO TABS: 20.0000 mg | ORAL_TABLET | Freq: Every day | ORAL | 2 refills | Status: AC

## 2024-01-23 NOTE — Patient Instructions (Signed)
Please consider counseling. Contact [phone] to schedule an appointment or inquire about cost/insurance coverage.  [facility] located at [address], [place], [place].  Phone number = [phone].  Dr. Onoriode Edeh - Adult Psychiatry.    [facility] located at [address], [place], [place]. Phone number = [phone].   [facility] located at [address], [place], [place].  Phone number = [phone].   [facility] located at [address], [place], [place].  Phone number = [phone].  Aim to do some physical exertion for 150 minutes per week. This is typically divided into 5 days per week, 30 minutes per day. The activity should be enough to get your heart rate up. Anything is better than nothing if you have time constraints.  Let us know if you need anything.  

## 2024-01-23 NOTE — Progress Notes (Signed)
 Chief Complaint  Patient presents with   Anxiety    Anxiety     Subjective Amber [name] presents for f/u anxiety.  Pt is currently being treated with Seroquel  300 mg/d, Lexapro  10 mg/d.  Reports doing OK since treatment. Having panic attacks. Cannot take beta blockers. On antihistamines for other issues.  No thoughts of harming self or others. No self-medication with alcohol, prescription drugs or illicit drugs. Pt is not following with a counselor/psychologist.  Past Medical History:  Diagnosis Date   Anxiety    Asthma    Depression    Diabetes (HCC)    GERD (gastroesophageal reflux disease)    History of chicken pox    History of migraine headaches    Migraines    Pituitary adenoma (HCC)    POTS (postural orthostatic tachycardia syndrome)    Seizures (HCC)     Exam BP 110/78 (BP Location: Left Arm, Patient Position: Sitting)   Pulse 95   Temp 98 F (36.7 C) (Oral)   Resp 16   Ht 5' 6 (1.676 m)   Wt 183 lb (83 kg)   SpO2 98%   BMI 29.54 kg/m  General:  well developed, well nourished, in no apparent distress Lungs:  No respiratory distress Psych: well oriented with normal range of affect and age-appropriate judgement/insight, alert and oriented x4.  Assessment and Plan  GAD (generalized anxiety disorder) - Plan: Ambulatory referral to Psychology  Panic attack - Plan: Ambulatory referral to Psychology  Chronic, unstable. Refer to counseling. Increase Lexapro  to 20 mg/d. Add BuSpar 7.5 mg TID prn. F/u in 1 mo. The patient voiced understanding and agreement to the plan.  Mabel Mt [place], DO 01/23/24 3:40 PM

## 2024-01-26 ENCOUNTER — Encounter: Payer: Self-pay | Admitting: Family Medicine

## 2024-01-26 ENCOUNTER — Telehealth: Payer: Self-pay

## 2024-01-26 DIAGNOSIS — E1165 Type 2 diabetes mellitus with hyperglycemia: Secondary | ICD-10-CM

## 2024-01-26 MED ORDER — TIRZEPATIDE 15 MG/0.5ML ~~LOC~~ SOAJ
15.0000 mg | SUBCUTANEOUS | 1 refills | Status: AC
Start: 2024-01-26 — End: ?

## 2024-01-26 MED ORDER — FENOFIBRATE 145 MG PO TABS: 145.0000 mg | ORAL_TABLET | Freq: Every day | ORAL | 1 refills | Status: DC

## 2024-01-26 NOTE — Addendum Note (Signed)
 Addended by: Jere Vanburen D on: 01/26/2024 12:43 PM   Modules accepted: Orders

## 2024-01-26 NOTE — Telephone Encounter (Signed)
 Pt called and per her insurance you do not need a PA for the skin biopsy. I will get front to scheduler to get her in for skin Biopsy.

## 2024-01-29 ENCOUNTER — Ambulatory Visit

## 2024-01-30 ENCOUNTER — Emergency Department (HOSPITAL_BASED_OUTPATIENT_CLINIC_OR_DEPARTMENT_OTHER)
Admission: EM | Admit: 2024-01-30 | Discharge: 2024-01-30 | Disposition: A | Discharge status: Home / Self Care | Attending: Emergency Medicine | Admitting: Emergency Medicine

## 2024-01-30 ENCOUNTER — Encounter (HOSPITAL_BASED_OUTPATIENT_CLINIC_OR_DEPARTMENT_OTHER): Payer: Self-pay

## 2024-01-30 ENCOUNTER — Other Ambulatory Visit: Payer: Self-pay

## 2024-01-30 DIAGNOSIS — R112 Nausea with vomiting, unspecified: Secondary | ICD-10-CM | POA: Insufficient documentation

## 2024-01-30 DIAGNOSIS — Z91010 Allergy to peanuts: Secondary | ICD-10-CM | POA: Diagnosis not present

## 2024-01-30 DIAGNOSIS — G43809 Other migraine, not intractable, without status migrainosus: Secondary | ICD-10-CM | POA: Diagnosis not present

## 2024-01-30 DIAGNOSIS — R519 Headache, unspecified: Secondary | ICD-10-CM | POA: Diagnosis present

## 2024-01-30 LAB — COMPREHENSIVE METABOLIC PANEL WITH GFR
ALT: 6 U/L (ref 0–44)
AST: 15 U/L (ref 15–41)
Albumin: 4.5 g/dL (ref 3.5–5.0)
Alkaline Phosphatase: 43 U/L (ref 38–126)
Anion gap: 13 (ref 5–15)
BUN: 15 mg/dL (ref 6–20)
CO2: 20 mmol/L — ABNORMAL LOW (ref 22–32)
Calcium: 9.2 mg/dL (ref 8.9–10.3)
Chloride: 105 mmol/L (ref 98–111)
Creatinine, Ser: 1.08 mg/dL — ABNORMAL HIGH (ref 0.44–1.00)
GFR, Estimated: >60 mL/min (ref >60)
Glucose, Bld: 82 mg/dL (ref 70–99)
Potassium: 3.6 mmol/L (ref 3.5–5.1)
Sodium: 139 mmol/L (ref 135–145)
Total Bilirubin: 0.4 mg/dL (ref 0.0–1.2)
Total Protein: 7.1 g/dL (ref 6.5–8.1)

## 2024-01-30 LAB — CBC WITH DIFFERENTIAL/PLATELET
Abs Immature Granulocytes: 0.01 K/uL (ref 0.00–0.07)
Basophils Absolute: 0 K/uL (ref 0.0–0.1)
Basophils Relative: 1 %
Eosinophils Absolute: 0.1 K/uL (ref 0.0–0.5)
Eosinophils Relative: 1 %
HCT: 40.3 % (ref 36.0–46.0)
Hemoglobin: 13.9 g/dL (ref 12.0–15.0)
Immature Granulocytes: 0 %
Lymphocytes Relative: 33 %
Lymphs Abs: 2.4 K/uL (ref 0.7–4.0)
MCH: 30.5 pg (ref 26.0–34.0)
MCHC: 34.5 g/dL (ref 30.0–36.0)
MCV: 88.4 fL (ref 80.0–100.0)
Monocytes Absolute: 0.5 K/uL (ref 0.1–1.0)
Monocytes Relative: 7 %
Neutro Abs: 4.2 K/uL (ref 1.7–7.7)
Neutrophils Relative %: 58 %
Platelets: 351 K/uL (ref 150–400)
RBC: 4.56 MIL/uL (ref 3.87–5.11)
RDW: 12.8 % (ref 11.5–15.5)
WBC: 7.2 K/uL (ref 4.0–10.5)
nRBC: 0 % (ref 0.0–0.2)

## 2024-01-30 LAB — LIPASE, BLOOD: Lipase: 15 U/L (ref 11–51)

## 2024-01-30 LAB — HCG, SERUM, QUALITATIVE: Preg, Serum: NEGATIVE

## 2024-01-30 MED ORDER — HYDROMORPHONE HCL 1 MG/ML IJ SOLN
1.0000 mg | Freq: Once | INTRAMUSCULAR | Status: AC
  Administered 2024-01-30: 1 mg via INTRAVENOUS
  Filled 2024-01-30: qty 1

## 2024-01-30 MED ORDER — METOCLOPRAMIDE HCL 5 MG/ML IJ SOLN
10.0000 mg | Freq: Once | INTRAMUSCULAR | Status: AC
  Administered 2024-01-30: 10 mg via INTRAVENOUS
  Filled 2024-01-30: qty 2

## 2024-01-30 MED ORDER — DIPHENHYDRAMINE HCL 50 MG/ML IJ SOLN
12.5000 mg | Freq: Once | INTRAMUSCULAR | Status: AC
  Administered 2024-01-30: 12.5 mg via INTRAVENOUS
  Filled 2024-01-30: qty 1

## 2024-01-30 MED ORDER — DEXAMETHASONE SOD PHOSPHATE PF 10 MG/ML IJ SOLN
10.0000 mg | Freq: Once | INTRAMUSCULAR | Status: AC
  Administered 2024-01-30: 10 mg via INTRAVENOUS

## 2024-01-30 MED ORDER — SODIUM CHLORIDE 0.9 % IV SOLN: INTRAVENOUS | Status: DC

## 2024-01-30 MED ORDER — SODIUM CHLORIDE 0.9 % IV BOLUS
1000.0000 mL | Freq: Once | INTRAVENOUS | Status: AC
  Administered 2024-01-30: 1000 mL via INTRAVENOUS

## 2024-01-30 NOTE — ED Notes (Signed)
 D/c paperwork reviewed with pt, including follow up care.  All questions and/or concerns addressed at time of d/c.  No further needs expressed. . Pt verbalized understanding, Ambulatory without assistance to ED exit, NAD.

## 2024-01-30 NOTE — ED Triage Notes (Signed)
 Reports migraine, dizziness, N/V since yesterday. Reports chronic migraine since she was a child   Ambulatory, A&Ox4

## 2024-01-30 NOTE — ED Provider Notes (Signed)
 [facility] Provider Note   CSN: [id number] Arrival date & time: 01/30/24  1419     Patient presents with: Migraine   Amber [name] is a 43 y.o. female with a past medical history significant for anxiety, pituitary adenoma, history of migraines, POTS, iron  deficiency anemia, and hyperlipidemia who presents to the ED due to headache that started yesterday morning.  Patient admits to a severe headache associated with nausea and vomiting.  Headache consistent with previous migraines.  Admits to photophobia and phonophobia.  No fever or chills.  No neck stiffness.  Admits to some dizziness.  Unable to tolerate p.o.  Has attempted her at home migraine medication with no relief.  Follows neurology.  Had an unremarkable MRI 1 month ago.  Denies visual and speech changes.  No unilateral weakness. Denies chest pain and shortness of breath.   History obtained from patient and past medical records. No interpreter used during encounter.       Prior to Admission medications   Medication Sig Start Date End Date Taking? Authorizing Provider  busPIRone (BUSPAR) 7.5 MG tablet Take 1 tab by mouth 3 times daily as needed for anxiety. 01/23/24   Frann Mabel Mt, DO  cabergoline  (DOSTINEX ) 0.5 MG tablet 2.5 mg weekly (Two tabs on Mondays, 1 tab on wednesdays and 2 tabs on Fridays) 01/09/23   Shamleffer, Ibtehal Jaralla, MD  cetirizine (ZYRTEC) 10 MG chewable tablet Chew 10 mg by mouth in the morning and at bedtime.    [provider]  Cholecalciferol (TRUE VITAMIN D3) 1.25 MG (50000 UT) capsule Take 1 capsule (50,000 Units total) by mouth daily. 10/11/23   Frann Mabel Mt, DO  Continuous Glucose Sensor (DEXCOM G7 SENSOR) MISC USE 1 SENSOR TOPICALLY EVERY 10 DAYS 12/25/23   Frann Mabel Mt, DO  cromolyn  (GASTROCROM ) 100 MG/5ML solution Take 5 mLs (100 mg total) by mouth 4 (four) times daily -  before meals and at bedtime. 10/10/23    Lorin Norris, MD  cyanocobalamin  (VITAMIN B12) 1000 MCG/ML injection Inject 1 mL (1,000 mcg total) into the muscle every 14 (fourteen) days. 08/31/23   Tonette Lauraine HERO, PA-C  dexlansoprazole  (DEXILANT ) 60 MG capsule Take 1 capsule (60 mg total) by mouth daily. 07/24/23   Cirigliano, Vito V, DO  EPINEPHrine  (EPIPEN  2-PAK) 0.3 mg/0.3 mL IJ SOAJ injection Inject 0.3 mg into the muscle as needed for anaphylaxis. 06/14/23   Frann Mabel Mt, DO  Erenumab -aooe (AIMOVIG ) 140 MG/ML SOAJ Inject 140 mg as directed subcutaneously once a month 07/04/23   Skeet, Adam R, DO  escitalopram  (LEXAPRO ) 20 MG tablet Take 1 tablet (20 mg total) by mouth daily. 01/23/24   Frann Mabel Mt, DO  famotidine  (PEPCID ) 20 MG tablet Take 20 mg by mouth 2 (two) times daily.    [provider]  fenofibrate  (TRICOR ) 145 MG tablet Take 1 tablet (145 mg total) by mouth daily. 01/26/24   Frann Mabel Mt, DO  folic acid  (FOLVITE ) 1 MG tablet Take 1 tablet (1 mg total) by mouth daily. 05/23/23   Tonette Lauraine HERO, PA-C  glucose blood (ACCU-CHEK GUIDE TEST) test strip Use to Check Blood [place] 09/06/23   Frann Mabel Mt, DO  ipratropium (ATROVENT ) 0.06 % nasal spray Place 2 sprays into both nostrils 4 (four) times daily. 09/29/23   Lorin Norris, MD  ivabradine (CORLANOR) 5 MG TABS tablet Take 1 tablet (5 mg total) by mouth 2 (two) times daily with a meal. 01/05/24   Camnitz,  Will Gladis, MD  Menaquinone-7 (VITAMIN K2) 100 MCG CAPS Take 1 Dose by mouth daily.    [provider]  minoxidil  (LONITEN ) 2.5 MG tablet Take 1 tablet (2.5 mg total) by mouth daily. 11/01/23   Frann Mabel Mt, DO  omalizumab  (XOLAIR ) 150 MG/ML prefilled syringe Inject 300 mg into the skin every 28 (twenty-eight) days. 06/22/23   Lorin Norris, MD  ondansetron  (ZOFRAN -ODT) 8 MG disintegrating tablet Take 1 tablet (8 mg total) by mouth every 8 (eight) hours as needed for nausea or vomiting. 09/06/23   Skeet, Adam  R, DO  QUEtiapine  (SEROQUEL ) 300 MG tablet Take 1 tablet (300 mg total) by mouth at bedtime. 05/22/23   Frann Mabel Mt, DO  Riboflavin 400 MG CAPS Take 400 mg by mouth daily.    [provider]  Rimegepant Sulfate (NURTEC) 75 MG TBDP Take 1 tablet (75 mg total) by mouth as needed (Daily as needed for a Migraine). Maximum 1 tablet in 24 hours.  Quantity 8. 09/06/23   Skeet, Adam R, DO  rosuvastatin  (CRESTOR ) 20 MG tablet Take 1 tablet (20 mg total) by mouth daily. 07/27/23   Frann Mabel Mt, DO  Syringe/Needle, Disp, 25G X 1 1 ML MISC 1 Dose by Does not apply route every 14 (fourteen) days. 08/31/23   Tonette Lauraine HERO, PA-C  tirzepatide  (MOUNJARO ) 15 MG/0.5ML Pen Inject 15 mg into the skin once a week. 01/26/24   Frann Mabel Mt, DO  topiramate  (TOPAMAX ) 100 MG tablet Take 1 tablet (100 mg total) by mouth at bedtime. 09/06/23   Skeet Juliene SAUNDERS, DO  traMADol  (ULTRAM ) 50 MG tablet TAKE ONE TABLET BY MOUTH EVERY 6 HOURS AS NEEDED 12/12/23   Skeet Juliene SAUNDERS, DO    Allergies: Bee venom, Chlorhexidine, Ibuprofen, Iodine, Peanut-containing drug products, Sesame seed (diagnostic), Shellfish allergy , Soybean-containing drug products, Sulfa antibiotics, Triptans, Lunesta  [eszopiclone ], Almond (diagnostic), Aspirin, Cashew nut (anacardium occidentale) skin test, Hazelnut (filbert), Nsaids, Tape, Walnut, and Wheat    Review of Systems  Constitutional:  Negative for fever.  Eyes:  Positive for photophobia. Negative for visual disturbance.  Respiratory:  Negative for shortness of breath.   Cardiovascular:  Negative for chest pain.  Neurological:  Positive for dizziness and headaches. Negative for speech difficulty.    Updated Vital Signs BP 108/79 (BP Location: Right Arm)   Pulse (!) 110   Temp 97.6 F (36.4 C) (Oral)   Resp 20   LMP 01/24/2024 (Exact Date)   SpO2 100%   Physical Exam Vitals and nursing note reviewed.  Constitutional:      General: Amber [name] is not in acute  distress.    Appearance: Amber [name] is not ill-appearing.  HENT:     Head: Normocephalic.  Eyes:     Pupils: Pupils are equal, round, and reactive to light.  Cardiovascular:     Rate and Rhythm: Normal rate and regular rhythm.     Pulses: Normal pulses.     Heart sounds: Normal heart sounds. No murmur heard.    No friction rub. No gallop.  Pulmonary:     Effort: Pulmonary effort is normal.     Breath sounds: Normal breath sounds.  Abdominal:     General: Abdomen is flat. There is no distension.     Palpations: Abdomen is soft.     Tenderness: There is no abdominal tenderness. There is no guarding or rebound.  Musculoskeletal:        General: Normal range of motion.     Cervical back:  Neck supple.  Skin:    General: Skin is warm and dry.  Neurological:     General: No focal deficit present.     Mental Status: Amber [name] is alert.     Comments: Speech is clear, able to follow commands CN III-XII intact Normal strength in upper and lower extremities bilaterally including dorsiflexion and plantar flexion, strong and equal grip strength Sensation grossly intact throughout Moves extremities without ataxia, coordination intact No pronator drift    Psychiatric:        Mood and Affect: Mood normal.        Behavior: Behavior normal.     (all labs ordered are listed, but only abnormal results are displayed) Labs Reviewed  COMPREHENSIVE METABOLIC PANEL WITH GFR - Abnormal; Notable for the following components:      Result Value   CO2 20 (*)    Creatinine, Ser 1.08 (*)    All other components within normal limits  CBC WITH DIFFERENTIAL/PLATELET  LIPASE, BLOOD  HCG, SERUM, QUALITATIVE    EKG: None  Radiology: No results found.   Procedures   Medications Ordered in the ED  sodium chloride  0.9 % bolus 1,000 mL (0 mLs Intravenous Stopped 01/30/24 1707)    And  0.9 %  sodium chloride  infusion (0 mLs Intravenous Hold 01/30/24 1533)  metoCLOPramide  (REGLAN ) injection 10 mg (10 mg  Intravenous Given 01/30/24 1503)  diphenhydrAMINE  (BENADRYL ) injection 12.5 mg (12.5 mg Intravenous Given 01/30/24 1501)  HYDROmorphone  (DILAUDID ) injection 1 mg (1 mg Intravenous Given 01/30/24 1507)  dexamethasone  (DECADRON ) injection 10 mg (10 mg Intravenous Given 01/30/24 1503)  HYDROmorphone  (DILAUDID ) injection 1 mg (1 mg Intravenous Given 01/30/24 1703)                                    Medical Decision Making Amount and/or Complexity of Data Reviewed External Data Reviewed: notes. Labs: ordered. Decision-making details documented in ED Course. ECG/medicine tests: ordered and independent interpretation performed. Decision-making details documented in ED Course.  Risk Prescription drug management.   This patient presents to the ED for concern of headache, this involves an extensive number of treatment options, and is a complaint that carries with it a high risk of complications and morbidity.  The differential diagnosis includes migraine, meningitis,   44 year old female presents to the ED due to headache associate with nausea and vomiting.  History of migraines.  Notes it feels similar to her previous migraines.  Currently follows neurology.  Also endorses some dizziness.  No visual or speech changes.  Denies unilateral weakness.  Upon arrival, noted to be tachycardic at 110 with otherwise unremarkable vitals.  Patient well-appearing on exam.  Has no meningismus to suggest meningitis.  Normal neurological exam with any neurological deficits.  Patient notes this feels similar to her previous migraines and had an unremarkable MRI 1 month ago so we will hold off on imaging.  Patient notes Amber [name] typically receives Reglan , IV fluids, Dilaudid , Benadryl , and Decadron  for her migraines with resolution in symptoms. Routine labs ordered due to nausea and vomiting.   CBC unremarkable.  No leukocytosis.  Normal hemoglobin.  CMP reassuring.  No major electrolyte derangements.  Lipase normal.  EKG  normal sinus rhythm.  No signs of acute ischemia.  3:50 PM reassessed patient at bedside.  Patient notes her headache has improved from 8/10 to a 6/10.  4:17 PM reassessed patient.  Still notes her pain is a  6/10.  Patient requesting another dose of Dilaudid .  Reviewed previous ED visits.  It does appear patient typically gets Dilaudid  with her migraine cocktail.  Patient does have underlying migraine disorder.  Will give another dose of Dilaudid  and reassess.   5:18 PM reassessed patient at bedside.  Patient admits to improvement in headache and notes Amber [name] is ready to be discharged.  Advised patient to follow-up with her neurologist for further evaluation.  No neurological deficits on exam.  Low suspicion for CVA.  No infectious symptoms or meningismus to suggest meningitis.  Suspect symptoms likely related to her history of migraines.  Patient stable for discharge. Strict ED precautions discussed with patient. Patient states understanding and agrees to plan. Patient discharged home in no acute distress and stable vitals  Co morbidities that complicate the patient evaluation  Hx migraines Cardiac Monitoring: / EKG:  The patient was maintained on a cardiac monitor.  I personally viewed and interpreted the cardiac monitored which showed an underlying rhythm of: NSR  Social Determinants of Health:  Has PCP  Test / Admission - Considered:  Considered admission; however headache improved after migraine cocktail.        Final diagnoses:  Other migraine without status migrainosus, not intractable    ED Discharge Orders     None          Lorelle Aleck BROCKS, PA-C 01/30/24 1721    Franklyn Sid SAILOR, MD 01/31/24 [phone]

## 2024-01-30 NOTE — Discharge Instructions (Signed)
 It was a pleasure taking care of you today.  As discussed, your workup was reassuring.  You were treated for your migraine.  Please follow-up with your neurologist for further evaluation.  Return to the ER for any worsening symptoms.

## 2024-02-13 ENCOUNTER — Ambulatory Visit: Admitting: Family Medicine

## 2024-02-13 ENCOUNTER — Ambulatory Visit (INDEPENDENT_AMBULATORY_CARE_PROVIDER_SITE_OTHER): Admitting: Neurology

## 2024-02-13 DIAGNOSIS — R202 Paresthesia of skin: Secondary | ICD-10-CM

## 2024-02-13 NOTE — Progress Notes (Signed)
 Punch Biopsy Procedure Note  Preprocedure Diagnosis: paresthesia of skin   Postprocedure Diagnosis: same  Locations: Site 1: right lateral distal leg;  Site 2: right lateral thigh  Indications: r/o small fiber neuropathy  Anesthesia: 5 mL Lidocaine  1% with epinephrine  Procedure Details Patient informed of the risks (including but not limited to bleeding, pain, infection, scar and infection) and benefits of the procedure.  Informed consent obtained.  The areas which were chosen for biopsy, as above, and surrounding areas were given a sterile prep using alcohol and iodine. The skin was then stretched perpendicular to the skin tension lines and sample removed using the 3 mm punch. Pressure applied, hemostasis achieved.   Dressing applied. The specimen(s) was sent for pathologic examination. The patient tolerated the procedure well.  Estimated Blood Loss: 0 ml  Condition: Stable  Complications: none.  Plan: 1. Instructed to keep the wound dry and covered for 24h and clean thereafter. 2. Warning signs of infection were reviewed.    Venetia Potters, MD [facility]

## 2024-02-13 NOTE — Patient Instructions (Signed)
 INFORMATION FOR PATIENTS AFTER SKIN BIOPSY:   What You Need to Do:  1. Keep your current bandage on for 24 hours and do not shower during this time. Change your bandages every day starting tomorrow. Leave today's bandage on until AFTER your next shower. Keep the bandages on while you shower. Change them once a day until a scab forms.  2. You may take showers after 24 hours, but DO NOT take tub baths, go in hot tubs or go swimming for seven days after the procedure.  3. You may use vasoline, bacitracin, or Polysporin ointment on the wounds as needed.  4. If a scab forms at the biopsy site, leave it alone.  5. If bleeding occurs, apply firm pressure for two minutes with a clean piece of gauze.   Please contact us  immediately if there is any redness, any signs of infection, or significant bleeding at the biopsy site.   Please call our office at [phone].

## 2024-02-14 ENCOUNTER — Ambulatory Visit: Admitting: Professional

## 2024-02-14 ENCOUNTER — Inpatient Hospital Stay: Attending: Medical Oncology

## 2024-02-14 ENCOUNTER — Encounter: Payer: Self-pay | Admitting: Professional

## 2024-02-14 VITALS — BP 81/65 | HR 99 | Resp 18

## 2024-02-14 DIAGNOSIS — F423 Hoarding disorder: Secondary | ICD-10-CM

## 2024-02-14 DIAGNOSIS — F422 Mixed obsessional thoughts and acts: Secondary | ICD-10-CM | POA: Diagnosis not present

## 2024-02-14 DIAGNOSIS — F840 Autistic disorder: Secondary | ICD-10-CM | POA: Diagnosis not present

## 2024-02-14 DIAGNOSIS — E538 Deficiency of other specified B group vitamins: Secondary | ICD-10-CM | POA: Insufficient documentation

## 2024-02-14 DIAGNOSIS — D509 Iron deficiency anemia, unspecified: Secondary | ICD-10-CM

## 2024-02-14 DIAGNOSIS — F411 Generalized anxiety disorder: Secondary | ICD-10-CM | POA: Diagnosis not present

## 2024-02-14 MED ORDER — CYANOCOBALAMIN 1000 MCG/ML IJ SOLN
1000.0000 ug | Freq: Once | INTRAMUSCULAR | Status: AC
  Administered 2024-02-14: 1000 ug via INTRAMUSCULAR
  Filled 2024-02-14: qty 1

## 2024-02-14 NOTE — Progress Notes (Signed)
   Nathanel Collet, [facility]

## 2024-02-14 NOTE — Patient Instructions (Signed)
 Vitamin B12 Deficiency Vitamin B12 deficiency means that your body does not have enough vitamin B12. The body needs this important vitamin: To make red blood cells. To make genes (DNA). To help the nerves work. If you do not have enough vitamin B12 in your body, you can have health problems, such as not having enough red blood cells in the blood (anemia). What are the causes? Not eating enough foods that contain vitamin B12. Not being able to take in (absorb) vitamin B12 from the food that you eat. Certain diseases. A condition in which the body does not make enough of a certain protein. This results in your body not taking in enough vitamin B12. Having a surgery in which part of the stomach or small intestine is taken out. Taking medicines that make it hard for the body to take in vitamin B12. These include: Heartburn medicines. Some medicines that are used to treat diabetes. What increases the risk? Being an older adult. Eating a vegetarian or vegan diet that does not include any foods that come from animals. Not eating enough foods that contain vitamin B12 while you are pregnant. Taking certain medicines. Having alcoholism. What are the signs or symptoms? In some cases, there are no symptoms. If the condition leads to too few blood cells or nerve damage, symptoms can occur, such as: Feeling weak or tired. Not being hungry. Losing feeling (numbness) or tingling in your hands and feet. Redness and burning of the tongue. Feeling sad (depressed). Confusion or memory problems. Trouble walking. If anemia is very bad, symptoms can include: Being short of breath. Being dizzy. Having a very fast heartbeat. How is this treated? Changing the way you eat and drink, such as: Eating more foods that contain vitamin B12. Drinking little or no alcohol. Getting vitamin B12 shots. Taking vitamin B12 supplements by mouth (orally). Your doctor will tell you the dose that is best for you. Follow  these instructions at home: Eating and drinking  Eat foods that come from animals and have a lot of vitamin B12 in them. These include: Meats and poultry. This includes beef, pork, chicken, [place], and organ meats, such as liver. Seafood, such as clams, rainbow trout, salmon, tuna, and haddock. Eggs. Dairy foods such as milk, yogurt, and cheese. Eat breakfast cereals that have vitamin B12 added to them (are fortified). Check the label. The items listed above may not be a complete list of foods and beverages you can eat and drink. Contact a dietitian for more information. Alcohol use Do not drink alcohol if: Your doctor tells you not to drink. You are pregnant, may be pregnant, or are planning to become pregnant. If you drink alcohol: Limit how much you have to: 0-1 drink a day for women. 0-2 drinks a day for men. Know how much alcohol is in your drink. In the [place] one drink equals one 12 oz bottle of beer (355 mL), one 5 oz glass of wine (148 mL), or one 1 oz glass of hard liquor (44 mL). General instructions Get any vitamin B12 shots if told by your doctor. Take supplements only as told by your doctor. Follow the directions. Keep all follow-up visits. Contact a doctor if: Your symptoms come back. Your symptoms get worse or do not get better with treatment. Get help right away if: You have trouble breathing. You have a very fast heartbeat. You have chest pain. You get dizzy. You faint. These symptoms may be an emergency. Get help right away. Call 911.  Do not wait to see if the symptoms will go away. Do not drive yourself to the hospital. Summary Vitamin B12 deficiency means that your body is not getting enough of the vitamin. In some cases, there are no symptoms of this condition. Treatment may include making a change in the way you eat and drink, getting shots, or taking supplements. Eat foods that have vitamin B12 in them. This information is not intended to replace advice  given to you by your health care provider. Make sure you discuss any questions you have with your health care provider. Document Revised: 10/30/2020 Document Reviewed: 10/30/2020 Elsevier Patient Education  2024 [employer].
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
## 2024-02-19 ENCOUNTER — Encounter: Payer: Self-pay | Admitting: Family Medicine

## 2024-02-19 ENCOUNTER — Ambulatory Visit: Admitting: Family Medicine

## 2024-02-19 VITALS — BP 96/70 | HR 92 | Ht 66.0 in | Wt 183.0 lb

## 2024-02-19 DIAGNOSIS — G901 Familial dysautonomia [Riley-Day]: Secondary | ICD-10-CM

## 2024-02-19 DIAGNOSIS — Q796 Ehlers-Danlos syndrome, unspecified: Secondary | ICD-10-CM | POA: Diagnosis not present

## 2024-02-19 DIAGNOSIS — M2550 Pain in unspecified joint: Secondary | ICD-10-CM

## 2024-02-19 MED ORDER — MIDODRINE HCL 2.5 MG PO TABS: 2.5000 mg | ORAL_TABLET | Freq: Three times a day (TID) | ORAL | 3 refills | Status: DC

## 2024-02-19 NOTE — Patient Instructions (Addendum)
 Thank you for coming in today.   A referral for physical therapy has been submitted. A representative from the physical therapy office will contact you to coordinate scheduling after confirming your benefits with your insurance provider. If you do not hear from the physical therapy office within the next 1-2 weeks, please let us  know.   RX sent in for Midodrine   See you back as needed
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
## 2024-02-23 ENCOUNTER — Other Ambulatory Visit: Payer: Self-pay | Admitting: Neurology

## 2024-02-23 ENCOUNTER — Other Ambulatory Visit: Payer: Self-pay

## 2024-02-23 ENCOUNTER — Encounter: Payer: Self-pay | Admitting: Family Medicine

## 2024-02-23 ENCOUNTER — Telehealth: Payer: Self-pay

## 2024-02-23 ENCOUNTER — Encounter (HOSPITAL_BASED_OUTPATIENT_CLINIC_OR_DEPARTMENT_OTHER): Payer: Self-pay

## 2024-02-23 ENCOUNTER — Emergency Department (HOSPITAL_BASED_OUTPATIENT_CLINIC_OR_DEPARTMENT_OTHER)
Admission: EM | Admit: 2024-02-23 | Discharge: 2024-02-23 | Disposition: A | Discharge status: Home / Self Care | Attending: Emergency Medicine | Admitting: Emergency Medicine

## 2024-02-23 ENCOUNTER — Ambulatory Visit: Admitting: Family Medicine

## 2024-02-23 VITALS — BP 105/72 | HR 97 | Temp 98.0°F | Resp 16 | Ht 66.0 in | Wt 184.0 lb

## 2024-02-23 DIAGNOSIS — R5383 Other fatigue: Secondary | ICD-10-CM

## 2024-02-23 DIAGNOSIS — R42 Dizziness and giddiness: Secondary | ICD-10-CM

## 2024-02-23 DIAGNOSIS — Z91010 Allergy to peanuts: Secondary | ICD-10-CM | POA: Insufficient documentation

## 2024-02-23 DIAGNOSIS — G90A Postural orthostatic tachycardia syndrome (POTS): Secondary | ICD-10-CM | POA: Insufficient documentation

## 2024-02-23 DIAGNOSIS — E119 Type 2 diabetes mellitus without complications: Secondary | ICD-10-CM | POA: Insufficient documentation

## 2024-02-23 DIAGNOSIS — F411 Generalized anxiety disorder: Secondary | ICD-10-CM

## 2024-02-23 DIAGNOSIS — Z7984 Long term (current) use of oral hypoglycemic drugs: Secondary | ICD-10-CM | POA: Insufficient documentation

## 2024-02-23 DIAGNOSIS — E1165 Type 2 diabetes mellitus with hyperglycemia: Secondary | ICD-10-CM

## 2024-02-23 LAB — COMPREHENSIVE METABOLIC PANEL WITH GFR
AG Ratio: 1.8 (calc) (ref 1.0–2.5)
ALT: 5 U/L (ref 0–44)
ALT: 5 U/L — ABNORMAL LOW (ref 6–29)
AST: 10 U/L (ref 10–30)
AST: 14 U/L — ABNORMAL LOW (ref 15–41)
Albumin: 4.4 g/dL (ref 3.5–5.0)
Albumin: 4.5 g/dL (ref 3.6–5.1)
Alkaline Phosphatase: 51 U/L (ref 38–126)
Alkaline phosphatase (APISO): 42 U/L (ref 31–125)
Anion gap: 9 (ref 5–15)
BUN/Creatinine Ratio: 14 (calc) (ref 6–22)
BUN: 16 mg/dL (ref 6–20)
BUN: 17 mg/dL (ref 7–25)
CO2: 20 mmol/L — ABNORMAL LOW (ref 22–32)
CO2: 21 mmol/L (ref 20–32)
Calcium: 9.2 mg/dL (ref 8.9–10.3)
Calcium: 9.8 mg/dL (ref 8.6–10.2)
Chloride: 107 mmol/L (ref 98–111)
Chloride: 108 mmol/L (ref 98–110)
Creat: 1.21 mg/dL — ABNORMAL HIGH (ref 0.50–0.99)
Creatinine, Ser: 1.08 mg/dL — ABNORMAL HIGH (ref 0.44–1.00)
GFR, Estimated: >60 mL/min (ref >60)
Globulin: 2.5 g/dL (ref 1.9–3.7)
Glucose, Bld: 80 mg/dL (ref 65–99)
Glucose, Bld: 82 mg/dL (ref 70–99)
Potassium: 4 mmol/L (ref 3.5–5.1)
Potassium: 4.7 mmol/L (ref 3.5–5.3)
Sodium: 136 mmol/L (ref 135–145)
Sodium: 137 mmol/L (ref 135–146)
Total Bilirubin: 0.4 mg/dL (ref 0.0–1.2)
Total Bilirubin: 0.5 mg/dL (ref 0.2–1.2)
Total Protein: 7 g/dL (ref 6.1–8.1)
Total Protein: 7 g/dL (ref 6.5–8.1)
eGFR: 57 mL/min/1.73m2 — ABNORMAL LOW (ref >=60)

## 2024-02-23 LAB — LIPID PANEL
Cholesterol: 107 mg/dL (ref <200)
HDL: 43 mg/dL — ABNORMAL LOW (ref >=50)
LDL Cholesterol (Calc): 45 mg/dL
Non-HDL Cholesterol (Calc): 64 mg/dL (ref <130)
Total CHOL/HDL Ratio: 2.5 (calc) (ref <5.0)
Triglycerides: 111 mg/dL (ref <150)

## 2024-02-23 LAB — HEMOGLOBIN A1C
Hgb A1c MFr Bld: 4.6 % (ref <5.7)
Mean Plasma Glucose: 85 mg/dL
eAG (mmol/L): 4.7 mmol/L

## 2024-02-23 LAB — CBC WITH DIFFERENTIAL/PLATELET
Abs Immature Granulocytes: 0.01 K/uL (ref 0.00–0.07)
Basophils Absolute: 0.1 K/uL (ref 0.0–0.1)
Basophils Relative: 1 %
Eosinophils Absolute: 0.1 K/uL (ref 0.0–0.5)
Eosinophils Relative: 1 %
HCT: 39.3 % (ref 36.0–46.0)
Hemoglobin: 13.8 g/dL (ref 12.0–15.0)
Immature Granulocytes: 0 %
Lymphocytes Relative: 36 %
Lymphs Abs: 2.4 K/uL (ref 0.7–4.0)
MCH: 30.9 pg (ref 26.0–34.0)
MCHC: 35.1 g/dL (ref 30.0–36.0)
MCV: 88.1 fL (ref 80.0–100.0)
Monocytes Absolute: 0.6 K/uL (ref 0.1–1.0)
Monocytes Relative: 9 %
Neutro Abs: 3.5 K/uL (ref 1.7–7.7)
Neutrophils Relative %: 53 %
Platelets: 324 K/uL (ref 150–400)
RBC: 4.46 MIL/uL (ref 3.87–5.11)
RDW: 12.9 % (ref 11.5–15.5)
WBC: 6.7 K/uL (ref 4.0–10.5)
nRBC: 0 % (ref 0.0–0.2)

## 2024-02-23 MED ORDER — ONDANSETRON HCL 4 MG/2ML IJ SOLN
4.0000 mg | Freq: Once | INTRAMUSCULAR | Status: AC
  Administered 2024-02-23: 4 mg via INTRAVENOUS
  Filled 2024-02-23: qty 2

## 2024-02-23 MED ORDER — SODIUM CHLORIDE 0.9 % IV BOLUS
1000.0000 mL | Freq: Once | INTRAVENOUS | Status: AC
  Administered 2024-02-23: 1000 mL via INTRAVENOUS

## 2024-02-23 NOTE — Discharge Instructions (Signed)
 You are seen in the emergency department for your dizziness and fatigue.  This is likely related to your POTS where you could have a viral syndrome that could be causing some dehydration contributing to your symptoms.  Your workup showed no severe dehydration, anemia and you are in a normal heart rhythm.  We did give you a liter of fluids in the ER.  You should continue to drink plenty of fluids and can take Zofran  as needed for nausea and over-the-counter Imodium as needed for diarrhea.  You can follow-up with your primary doctor to ensure that your symptoms improved.  You should return to the emergency department if you are having worsening dizziness and you pass out, you have severe chest pain or shortness of breath or any other new or concerning symptoms.

## 2024-02-23 NOTE — Patient Instructions (Signed)
 Give us  2-3 business days to get the results of your labs back.   Keep the diet clean and stay active.  Let us  know if you need anything.

## 2024-02-23 NOTE — ED Triage Notes (Signed)
 Pt states that she has POTS and she is in a flare. States that she needs IV fluids. States that she seen her PCP and they sent her down for evaluation.

## 2024-02-23 NOTE — Telephone Encounter (Signed)
 Skin biopsy results received.

## 2024-02-23 NOTE — ED Provider Notes (Signed)
 [place] [facility] Provider Note   CSN: [id number] Arrival date & time: 02/23/24  1453     Patient presents with: Fatigue   Amber [name] is a 43 y.o. female.   Patient is a 43 year old female with past medical history of POTS, EDS, anxiety, diabetes, pituitary adenoma presenting to the emergency department with fatigue and palpitations.  Patient states that she has felt like she is in a POTS flare for the last 3 days.  She states that normally her resting heart rate is in the 60s and has been in the 80s and when she stands up we will go up to the 130s.  She states that she has had some nausea but denies any vomiting.  She states that she has had some diarrhea.  She denies any abdominal pain or fevers.  She denies any cough, congestion or sore throat.  She denies any known sick contacts.  The history is provided by the patient.       Prior to Admission medications   Medication Sig Start Date End Date Taking? Authorizing Provider  busPIRone  (BUSPAR ) 7.5 MG tablet Take 1 tab by mouth 3 times daily as needed for anxiety. 01/23/24   Frann Mabel Mt, DO  cabergoline  (DOSTINEX ) 0.5 MG tablet 2.5 mg weekly (Two tabs on Mondays, 1 tab on wednesdays and 2 tabs on Fridays) 01/09/23   Shamleffer, Ibtehal Jaralla, MD  cetirizine (ZYRTEC) 10 MG chewable tablet Chew 10 mg by mouth in the morning and at bedtime.    [provider]  Cholecalciferol (TRUE VITAMIN D3) 1.25 MG (50000 UT) capsule Take 1 capsule (50,000 Units total) by mouth daily. 10/11/23   Frann Mabel Mt, DO  Continuous Glucose Sensor (DEXCOM G7 SENSOR) MISC USE 1 SENSOR TOPICALLY EVERY 10 DAYS 12/25/23   Frann Mabel Mt, DO  cromolyn  (GASTROCROM ) 100 MG/5ML solution Take 5 mLs (100 mg total) by mouth 4 (four) times daily -  before meals and at bedtime. 10/10/23   Lorin Norris, MD  cyanocobalamin  (VITAMIN B12) 1000 MCG/ML injection Inject 1 mL (1,000 mcg total) into  the muscle every 14 (fourteen) days. 08/31/23   Tonette Lauraine HERO, PA-C  dexlansoprazole  (DEXILANT ) 60 MG capsule Take 1 capsule (60 mg total) by mouth daily. 07/24/23   Cirigliano, Vito V, DO  EPINEPHrine  (EPIPEN  2-PAK) 0.3 mg/0.3 mL IJ SOAJ injection Inject 0.3 mg into the muscle as needed for anaphylaxis. 06/14/23   Frann Mabel Mt, DO  Erenumab -aooe (AIMOVIG ) 140 MG/ML SOAJ Inject 140 mg as directed subcutaneously once a month 07/04/23   Skeet, Adam R, DO  escitalopram  (LEXAPRO ) 20 MG tablet Take 1 tablet (20 mg total) by mouth daily. 01/23/24   Frann Mabel Mt, DO  famotidine  (PEPCID ) 20 MG tablet Take 20 mg by mouth 2 (two) times daily.    [provider]  fenofibrate  (TRICOR ) 145 MG tablet Take 1 tablet (145 mg total) by mouth daily. 01/26/24   Frann Mabel Mt, DO  folic acid  (FOLVITE ) 1 MG tablet Take 1 tablet (1 mg total) by mouth daily. 05/23/23   Tonette Lauraine HERO, PA-C  glucose blood (ACCU-CHEK GUIDE TEST) test strip Use to Check Blood Suagr 09/06/23   Frann Mabel Mt, DO  ipratropium (ATROVENT ) 0.06 % nasal spray Place 2 sprays into both nostrils 4 (four) times daily. 09/29/23   Lorin Norris, MD  Menaquinone-7 (VITAMIN K2) 100 MCG CAPS Take 1 Dose by mouth daily.    [provider]  midodrine  (PROAMATINE ) 2.5  MG tablet Take 1 tablet (2.5 mg total) by mouth 3 (three) times daily with meals. 02/19/24   Corey, Evan S, MD  minoxidil  (LONITEN ) 2.5 MG tablet Take 1 tablet (2.5 mg total) by mouth daily. 11/01/23   Frann Mabel Mt, DO  omalizumab  (XOLAIR ) 150 MG/ML prefilled syringe Inject 300 mg into the skin every 28 (twenty-eight) days. 06/22/23   Lorin Norris, MD  ondansetron  (ZOFRAN -ODT) 8 MG disintegrating tablet Take 1 tablet (8 mg total) by mouth every 8 (eight) hours as needed for nausea or vomiting. 09/06/23   Skeet Juliene SAUNDERS, DO  QUEtiapine  (SEROQUEL ) 300 MG tablet Take 1 tablet (300 mg total) by mouth at bedtime. 05/22/23   Frann Mabel Mt, DO  Riboflavin 400 MG CAPS Take 400 mg by mouth daily.    [provider]  Rimegepant Sulfate (NURTEC) 75 MG TBDP Take 1 tablet (75 mg total) by mouth as needed (Daily as needed for a Migraine). Maximum 1 tablet in 24 hours.  Quantity 8. 09/06/23   Skeet, Adam R, DO  rosuvastatin  (CRESTOR ) 20 MG tablet Take 1 tablet (20 mg total) by mouth daily. 07/27/23   Frann Mabel Mt, DO  Syringe/Needle, Disp, 25G X 1 1 ML MISC 1 Dose by Does not apply route every 14 (fourteen) days. 08/31/23   Tonette Lauraine HERO, PA-C  tirzepatide  (MOUNJARO ) 15 MG/0.5ML Pen Inject 15 mg into the skin once a week. 01/26/24   Frann Mabel Mt, DO  topiramate  (TOPAMAX ) 100 MG tablet Take 1 tablet (100 mg total) by mouth at bedtime. 09/06/23   Skeet Juliene SAUNDERS, DO  traMADol  (ULTRAM ) 50 MG tablet TAKE ONE TABLET BY MOUTH EVERY 6 HOURS AS NEEDED 12/12/23   Skeet Juliene SAUNDERS, DO    Allergies: Bee venom, Chlorhexidine, Ibuprofen, Iodine, Peanut-containing drug products, Sesame seed (diagnostic), Shellfish allergy , Soybean-containing drug products, Sulfa antibiotics, Triptans, Lunesta  [eszopiclone ], Almond (diagnostic), Aspirin, Cashew nut (anacardium occidentale) skin test, Hazelnut (filbert), Nsaids, Tape, Walnut, and Wheat    Review of Systems  Updated Vital Signs BP 110/73   Pulse 89   Temp 98.5 F (36.9 C)   Resp 18   Ht 5' 6 (1.676 m)   Wt 83.5 kg   LMP 01/24/2024 (Exact Date)   SpO2 100%   BMI 29.70 kg/m   Physical Exam Vitals and nursing note reviewed.  Constitutional:      General: She is not in acute distress.    Appearance: Normal appearance.  HENT:     Head: Normocephalic and atraumatic.     Nose: Nose normal.     Mouth/Throat:     Mouth: Mucous membranes are moist.     Pharynx: Oropharynx is clear.  Eyes:     Extraocular Movements: Extraocular movements intact.     Conjunctiva/sclera: Conjunctivae normal.  Cardiovascular:     Rate and Rhythm: Normal rate and regular  rhythm.     Heart sounds: Normal heart sounds.  Pulmonary:     Effort: Pulmonary effort is normal.     Breath sounds: Normal breath sounds.  Abdominal:     General: Abdomen is flat.     Palpations: Abdomen is soft.     Tenderness: There is no abdominal tenderness.  Musculoskeletal:        General: Normal range of motion.     Cervical back: Normal range of motion.  Skin:    General: Skin is warm and dry.  Neurological:     Mental Status: She is alert and oriented to person,  place, and time.  Psychiatric:        Mood and Affect: Mood normal.        Behavior: Behavior normal.     (all labs ordered are listed, but only abnormal results are displayed) Labs Reviewed  COMPREHENSIVE METABOLIC PANEL WITH GFR - Abnormal; Notable for the following components:      Result Value   CO2 20 (*)    Creatinine, Ser 1.08 (*)    AST 14 (*)    All other components within normal limits  CBC WITH DIFFERENTIAL/PLATELET  PREGNANCY, URINE    EKG: None  Radiology: No results found.   Procedures   Medications Ordered in the ED  sodium chloride  0.9 % bolus 1,000 mL (1,000 mLs Intravenous New Bag/Given 02/23/24 1529)  ondansetron  (ZOFRAN ) injection 4 mg (4 mg Intravenous Given 02/23/24 1529)    Clinical Course as of 02/23/24 1633  Fri Feb 23, 2024  1555 Labs at baseline without acute abnormality. EKG within normal range.  [VK]  1631 On reassessment, patient's symptoms have significantly improved.  She is stable for discharge home with outpatient follow-up.  Was recommended continued symptomatic management and was given strict return precaution. [VK]    Clinical Course User Index [VK] Kingsley, Tameika Heckmann K, DO                                 Medical Decision Making This patient presents to the ED with chief complaint(s) of dizziness, fatigue with pertinent past medical history of POTS, EDS, anxiety, diabetes, pituitary adenoma which further complicates the presenting complaint. The complaint  involves an extensive differential diagnosis and also carries with it a high risk of complications and morbidity.    The differential diagnosis includes dehydration, electrolyte abnormality, anemia, arrhythmia, viral syndrome, gastroenteritis, POTS flare  Additional history obtained: Additional history obtained from N/A Records reviewed Primary Care Documents  ED Course and Reassessment: On patient's arrival she is hemodynamically stable in no acute distress.  Patient will have EKG and labs to evaluate for possible etiology of her symptoms.  She will be started on fluids and given nausea control and closely reassess.  Independent labs interpretation:  The following labs were independently interpreted: at baseline/within normal range  Independent visualization of imaging: - N/A  Consultation: - Consulted or discussed management/test interpretation w/ external professional: N/A  Consideration for admission or further workup: Patient has no emergent conditions requiring admission or further work-up at this time and is stable for discharge home with primary care follow-up  Social Determinants of health: N/A    Amount and/or Complexity of Data Reviewed Labs: ordered.  Risk Prescription drug management.       Final diagnoses:  POTS (postural orthostatic tachycardia syndrome)  Dizziness  Fatigue, unspecified type    ED Discharge Orders     None          Kingsley, Josiyah Tozzi K, DO 02/23/24 1633

## 2024-02-23 NOTE — Progress Notes (Signed)
 Chief Complaint  Patient presents with   Follow-up    Follow Up    Subjective Amber [name] presents for f/u anxiety.  Pt is currently being treated with Lexapro  20 mg daily, Seroquel  300 mg daily, BuSpar  7.5 mg 3 times daily.  Reports doing a little since treatment. No thoughts of harming self or others. No self-medication with alcohol, prescription drugs or illicit drugs. Pt is following with a counselor/psychologist.  Diabetes Patient with a history of type 2 diabetes.  She has a continuous glucose monitor without episodes of hypoglycemia.  She is not having excessive spikes.  She is compliant with Mounjaro  15 mg weekly.  No adverse effects.  Diet is fair.  She does stay relatively active.  No chest pain or shortness of breath.  Past Medical History:  Diagnosis Date   Anxiety    Asthma    Depression    Diabetes (HCC)    GERD (gastroesophageal reflux disease)    History of chicken pox    History of migraine headaches    Migraines    Pituitary adenoma (HCC)    POTS (postural orthostatic tachycardia syndrome)    Seizures (HCC)    Allergies as of 02/23/2024       Reactions   Bee Venom Anaphylaxis   Chlorhexidine Hives, Itching, Rash   Ibuprofen Other (See Comments)   GI bleed   Iodine Hives, Itching, Rash   Peanut-containing Drug Products Other (See Comments)   Pt reports unknown   Sesame Seed (diagnostic) Anaphylaxis   Shellfish Allergy  Shortness Of Breath, Swelling, Other (See Comments)   Tingling in the lips, mouth and throat   Soybean-containing Drug Products Anaphylaxis   Sulfa Antibiotics Anaphylaxis   Triptans Other (See Comments)   Makes pain worse. Pain in joints   Lunesta  [eszopiclone ] Nausea And Vomiting   GI issues   Almond (diagnostic) Other (See Comments)   Unknown per pt   Aspirin Other (See Comments)   UNKNOWN REACTION. Her parents and siblings are allergic to Aspirin.   Cashew Nut (anacardium Occidentale) Skin Test Other (See Comments)    Unknown per pt   Hazelnut (filbert) Other (See Comments)   Unknown per pt   Nsaids Nausea And Vomiting   Tape Rash   Blisters   Walnut Other (See Comments)   Unknown per pt   Wheat Nausea And Vomiting        Medication List        Accurate as of February 23, 2024  2:55 PM. If you have any questions, ask your nurse or doctor.          Accu-Chek Guide Test test strip Generic drug: glucose blood Use to Check Blood Suagr   Aimovig  140 MG/ML Soaj Generic drug: Erenumab -aooe Inject 140 mg as directed subcutaneously once a month   busPIRone  7.5 MG tablet Commonly known as: BUSPAR  Take 1 tab by mouth 3 times daily as needed for anxiety.   cabergoline  0.5 MG tablet Commonly known as: DOSTINEX  2.5 mg weekly (Two tabs on Mondays, 1 tab on wednesdays and 2 tabs on Fridays)   cetirizine 10 MG chewable tablet Commonly known as: ZYRTEC Chew 10 mg by mouth in the morning and at bedtime.   cromolyn  100 MG/5ML solution Commonly known as: GASTROCROM  Take 5 mLs (100 mg total) by mouth 4 (four) times daily -  before meals and at bedtime.   cyanocobalamin  1000 MCG/ML injection Commonly known as: VITAMIN B12 Inject 1 mL (1,000 mcg total) into the muscle  every 14 (fourteen) days.   Dexcom G7 Sensor Misc USE 1 SENSOR TOPICALLY EVERY 10 DAYS   dexlansoprazole  60 MG capsule Commonly known as: Dexilant  Take 1 capsule (60 mg total) by mouth daily.   EPINEPHrine  0.3 mg/0.3 mL Soaj injection Commonly known as: EpiPen  2-Pak Inject 0.3 mg into the muscle as needed for anaphylaxis.   escitalopram  20 MG tablet Commonly known as: Lexapro  Take 1 tablet (20 mg total) by mouth daily.   famotidine  20 MG tablet Commonly known as: PEPCID  Take 20 mg by mouth 2 (two) times daily.   fenofibrate  145 MG tablet Commonly known as: TRICOR  Take 1 tablet (145 mg total) by mouth daily.   folic acid  1 MG tablet Commonly known as: FOLVITE  Take 1 tablet (1 mg total) by mouth daily.   ipratropium  0.06 % nasal spray Commonly known as: ATROVENT  Place 2 sprays into both nostrils 4 (four) times daily.   midodrine  2.5 MG tablet Commonly known as: PROAMATINE  Take 1 tablet (2.5 mg total) by mouth 3 (three) times daily with meals.   minoxidil  2.5 MG tablet Commonly known as: LONITEN  Take 1 tablet (2.5 mg total) by mouth daily.   Nurtec 75 MG Tbdp Generic drug: Rimegepant Sulfate Take 1 tablet (75 mg total) by mouth as needed (Daily as needed for a Migraine). Maximum 1 tablet in 24 hours.  Quantity 8.   omalizumab  150 MG/ML prefilled syringe Commonly known as: Xolair  Inject 300 mg into the skin every 28 (twenty-eight) days.   ondansetron  8 MG disintegrating tablet Commonly known as: ZOFRAN -ODT Take 1 tablet (8 mg total) by mouth every 8 (eight) hours as needed for nausea or vomiting.   QUEtiapine  300 MG tablet Commonly known as: SEROQUEL  Take 1 tablet (300 mg total) by mouth at bedtime.   Riboflavin 400 MG Caps Take 400 mg by mouth daily.   rosuvastatin  20 MG tablet Commonly known as: Crestor  Take 1 tablet (20 mg total) by mouth daily.   Syringe/Needle (Disp) 25G X 1 1 ML Misc 1 Dose by Does not apply route every 14 (fourteen) days.   tirzepatide  15 MG/0.5ML Pen Commonly known as: MOUNJARO  Inject 15 mg into the skin once a week.   topiramate  100 MG tablet Commonly known as: TOPAMAX  Take 1 tablet (100 mg total) by mouth at bedtime.   traMADol  50 MG tablet Commonly known as: ULTRAM  TAKE ONE TABLET BY MOUTH EVERY 6 HOURS AS NEEDED   True Vitamin D3 1.25 MG (50000 UT) capsule Generic drug: Cholecalciferol Take 1 capsule (50,000 Units total) by mouth daily.   Vitamin K2 100 MCG Caps Take 1 Dose by mouth daily.        Exam BP 105/72 (BP Location: Left Arm, Patient Position: Sitting)   Pulse 97   Temp 98 F (36.7 C) (Oral)   Resp 16   Ht 5' 6 (1.676 m)   Wt 184 lb (83.5 kg)   LMP 01/24/2024 (Exact Date)   SpO2 97%   BMI 29.70 kg/m  General:  well  developed, well nourished, in no apparent distress Lungs:  No respiratory distress Psych: well oriented with normal range of affect and age-appropriate judgement/insight, alert and oriented x4.  Assessment and Plan  GAD (generalized anxiety disorder)  Type 2 diabetes mellitus with hyperglycemia, without long-term current use of insulin  (HCC) - Plan: Lipid panel, Hemoglobin A1c, Comprehensive metabolic panel with GFR, Microalbumin / creatinine urine ratio  Chronic, relatively stable for now.  Continue Seroquel  300 mg nightly, Lexapro  20 mg  daily, BuSpar  7.5 mg 3 times daily.  Continue with the counseling team.  Counseled on exercise. Chronic, stable.  Continue Mounjaro  15 mg weekly.  Check above labs. Flu shot politely declined. I will see her in 6 months for physical or as needed. The patient voiced understanding and agreement to the plan.  Mabel Mt [place], DO 02/23/24 2:55 PM

## 2024-02-24 ENCOUNTER — Ambulatory Visit: Payer: Self-pay | Admitting: Family Medicine

## 2024-02-26 NOTE — Telephone Encounter (Signed)
 Patient advised of Dr.Jaffe note, The punch skin biopsy is normal. There isn't any evidence that her pain and tingling in the feet are due to neuropathy. Other possible causes can be the B12 (she is currently being treated - last lab from 2 months ago was 388 - any level less than 400 may cause symptoms) or side effect of topiramate . If the discomfort from the feet is too uncomfortable, we can try discontinuing topiramate  and see if symptoms improve.

## 2024-02-27 ENCOUNTER — Ambulatory Visit: Admitting: Professional

## 2024-02-27 ENCOUNTER — Encounter: Payer: Self-pay | Admitting: Professional

## 2024-02-27 DIAGNOSIS — F423 Hoarding disorder: Secondary | ICD-10-CM

## 2024-02-27 DIAGNOSIS — F411 Generalized anxiety disorder: Secondary | ICD-10-CM

## 2024-02-27 DIAGNOSIS — F840 Autistic disorder: Secondary | ICD-10-CM

## 2024-02-27 DIAGNOSIS — F422 Mixed obsessional thoughts and acts: Secondary | ICD-10-CM

## 2024-02-27 NOTE — Progress Notes (Signed)
 Behavioral Health Treatment Plan   Name:Amber [name] [place]  MRN: [id number]  Treatment Plan Development Date: 02/27/2024  Strengths: Spirituality, Hopefulness, Self Advocate, Able to Communicate Effectively, and supportive spouse, pt is helpful  Supports: spouse and friend in [facility]  Client Statement of Needs: Needing a little help with dealing with my anxiety and setting boundaries. The anxiety changes a lot, it's anxiety about leaving the house, house not being clean enough, animals not being taken care of, is my mom being taken care of, is my husband taken care of...  Treatment Level: Outpatient Individual Therapy  Client Treatment Preferences: virtual, in afternoons  Diagnosis: Autism  Autism spectrum disorder  Symptoms:  Persistent deficits in reciprocal social communication and social interaction across multiple contexts.Deficits in social-emotional reciprocity, ranging, for example, from abnormal social approach and failure of normal back and forth conversation; to reduced sharing of interests, emotions, or affect; failure to initiate or respond to social interactions., Deficits in nonverbal communicative behaviors used to social interaction, ranging, for example, from poorly integrated verbal and nonverbal communication; to abnormalities in eye contact and body language, or deficits in understanding or use of gestures; to a total lack of facial expressions and non-verbal communication., and Deficits in developing, maintaining, and understanding relationships, ranging, fore example, from difficulties adjusting behavior to suit various social contexts; to difficulties sharing imaginative play or in making friends; to absence of interest in peers. and Restricted, repetitive patterns of behavior, interests, or activities, as manifested by at least two of the following, currently or by history (examples are illustrative, not exhaustive; see text)Stereotyped or repetitive  motor movements, use of objects, or speech (e.g., simple motor stereotypies, lining up toys or flipping objects, echolalia, idiosyncratic phrases)., Insistence on sameness, inflexible adherence to routines, or ritualized patterns or verbal nonverbal behavior (e.g., extreme distress at small changes, difficulties with transitions, rigid thinking patterns, greeting rituals, need to take same route or eat food every day)., and Highly restricted, fixated interests that are abnormal in intensity or focus (e.g, strong attachment to or preoccupation with unusual objects, excessively circumscribed or perseverative interest).  Goals:  Build flexibility in changes in daily routine or environment., Bolster coping skills to improve distress tolerance., Develop consistent daily routine to support mood., Build and maintain achievable levels of functioning., Develop a better understanding of Autism Spectrum Disorder., and Build and maintain daily routine including self-care.  Objectives: Target Date For All Objectives: 02/26/2025  Identify and challenge negative cognitions and behaviors that contribute distress and poor functioning., Develop and maintain a consistent self-care routine and daily schedule., Identify and address interpersonal stressors via learned communication skills., Improve distress tolerances regarding day-to-day stressors, interpersonal stressors, work, and education stressors., Develop coping tools to manage distress including mindfulness, acceptance, relaxation, reframing, and challenging negative thoughts and feelings., and Develop and maintain mindfulness regarding gratitude towards self and others, recognizing positives in day-to-day life and relationships.  Progress Documentation:  New patient, initial plan  Interventions:  Psychoeducation regarding diagnoses and treatment, Rapport Building, Motivational Interviewing, CBT - reframing, challenging, cognitive restructuring, Behavioral  Activation, Relaxation and mindfulness, Distress Tolerance, Communication skills - conflict resolution, Social skills, Assertiveness, Self-Care - exercise, sleep, nutrition, Problem Solving, Role Play, Solution Focused, Strength-based, and Anger Management Training  Diagnosis: Anxiety  Generalized anxiety disorder  Symptoms:  Excessive and/or unrealistic worry that is difficult to control occurring more days than not for at least 6 months about a number of events or activities, Difficulty managing worry, Restlessness or feeling keyed up  or on edge, Being easily fatigued, Difficulty concentrating or mind going blank, Irritability, and Sleep disturbance (Difficulty falling or staying asleep, restlessness, or unsatisfying sleep  Goals:  Improve daily functioning by reducing overall anxiety symptoms including intensity, frequency, and duration of symptoms., Resolve issues and concerns that are resulting in anxiety., Build and employ tools and skills to reduce symptoms of anxiety, worry, and improve functioning day-to-day., and Address negative cognitions, distortions, and behaviors that contribute to anxiety symptoms and affect overall functioning.  Objectives: Target Date For All Objectives: 02/26/2025  Express understanding of anxiety diagnosis, treatment approaches and the need to address the thoughts, feelings, behaviors, and physiological symptoms associated with the symptoms and diagnosis., Develop coping tools to manage anxiety including mindfulness, acceptance, relaxation, reframing, and challenging negative thoughts and feelings., Identify, challenge, and manage negative self-talk, negative thinking about self and others, and maintain mindfulness regarding self-talk and cognitive distortions., Develop consistent self-care routine including exercise, healthful eating, consistent sleep., Identify contributing factors to current anxiety including family of origin issues, past trauma, significant  stressors, and negative cognitions., Participate in exposure therapy., Identify contributing factors to anxiety including previous or current situations., and Maintain mindfulness of symptoms and develop protocol to address symptoms to prevent relapse.  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 disorder  Symptoms:  Recurrent and persistent thoughts, urges or images that are experienced, at some time during the disturbance, as intrusive, unwanted, and that in most individuals cause marked anxiety or distress., The individual attempts to ignore or suppress such thoughts, urges, or images, or to neutralize them with some thought or action (i.e., by performing a compulsion)., Repetitive behaviors (e.g., hand washing, ordering checking) or mental acts (e.g., praying, counting, repeating words silently) that the person feels driven to perform in response to an obsession, or according to the rules that must be applied rigidly., The obsessions or compulsions are time consuming (e.g., take more than 1 hour per day) or cause clinically significant distress or impairment in social, occupational, or other important areas of functioning., The disturbance is not better explained by the symptoms of another mental disorder (e.g., excessive worries, as in generalized anxiety disorder; preoccupation with appearance, as in body dysmorphic disorder; difficulty discarding or parting with possession, as in  hoarding disorder; hair pulling, as in trichotillomania [hair-pulling disorder]; skin picking, as in excoriation [skin-picking] disorder); stereotypies, as in stereotypic movement disorder; ritualized eating behavior, as in eating disorders; preoccupation with substances or gambling, as in substance-related and addictive disorders; sexual urges or fantasies, as in paraphilic disorders; impulses, as in disruptive, impulse-control, and conduct disorders; guilty ruminations, as in major depressive disorder; thought insertion or delusional preoccupations, as in schizophrenia spectrum and other psychotic disorders; or repetitive patterns of behavior, as in autism spectrum disorder)., The disturbance is not due to the direct physiological effects of a substance (e.g., drug of abuse, a medication) or a general medical condition., and With good or fair insight: The individual recognizes that obsessive-compulsive beliefs are definitely or probably not true or that they may or may not be true.  Goals:  Alleviate OCD symptoms to return to effective functioning., Recognize, accept, and cope with  OCD Symptoms and feelings., Gain control over intrusive thoughts and Compulsions., Gain acceptance regarding intrusive thoughts and anxieties and work towards not responding to them., and Resist urge to engage in Compulsive behaviors.  Objectives: Target Date For All Objectives: 02/26/2025  Describe experiences with OCD including impact on functioning and attempts to resolve., Openly discuss symptoms of OCD develop alternative strategies to manage., Openly discuss Obsessive thoughts and establish alternative coping skills., Verbalize an accurate understanding of OCD., Learn and implement strategies to overcome OCD., Explore interpersonal problems and how to resolve to assist in improving mood., Learn and implement problem-solving., Learn and implement relaxation, mindfulness, and distress tolerance skills., Learn and implement  decision-making skills., Learn and implement relapse prevention skills., Implement mindfulness for relapse prevention., Verbalize an understanding of healthy and unhealthy emotions and increase the use of healthy emotions., Use mindfulness and acceptance strategies to reduce experiential and cognitive distress and increase value-based behavior., and Increasingly verbalize hopeful and positive statements regarding self, others, and the future.  Progress Documentation:   New patient, initial plan  Interventions:  Psychoeducation regarding diagnoses and treatment, Rapport Building, Motivational Interviewing, CBT - reframing, challenging, cognitive restructuring, Behavioral Activation, Relaxation and mindfulness, Distress Tolerance, Communication skills - conflict resolution, Social skills, Assertiveness, Self-Care - exercise, sleep, nutrition, Problem Solving, Exposure Therapy, Role Play, Solution Focused, and Strength-based   Expected duration of treatment: one year  Party responsible for implementation of interventions: Rollo L. Gerome, [facility]   This plan has been reviewed and created by the following participants: Rollo L. Gerome, [facility], and patient Amber [name]   A new plan will be created at least every 12 months.  The patient fully participated in the development of treatment plan with the clinician and verbally consents to such treatment.   Patient Treatment Plan Signature Obtained: No, pending signature page.  Nathanel Gerome, [facility]

## 2024-02-27 NOTE — Progress Notes (Signed)
 [facility] Counselor/Therapist Progress Note  Patient ID: Amber [name], MRN: [id number],    Date: 02/27/2024  Time Spent: 42 minutes 4- 442pm  Treatment Type: Individual Therapy  Risk Assessment: Danger to Self:  No Self-injurious Behavior: No Danger to Others: No   Subjective:  This session was held via video teletherapy. The patient consented to video teletherapy and was located at her home during this session. She is aware it is the responsibility of the patient to secure confidentiality on her end of the session. The provider was in a private home office for the duration of this session.    The patient arrived on time for her Caregility session.  Issues addressed: 1-treatment planning -pt and Clinician completed treatment planning in session -pt actively participated and fully agrees with plan  Interventions: see attached treatment plan  Diagnosis:Generalized anxiety disorder  Autism spectrum disorder  Mixed obsessional thoughts and acts  Hoarding disorder  Plan:  -meet again on Thursday, March 07, 2024 at 2pm.

## 2024-02-27 NOTE — Progress Notes (Signed)
   Amber [name], [facility]

## 2024-03-04 ENCOUNTER — Encounter: Payer: Self-pay | Admitting: Neurology

## 2024-03-04 ENCOUNTER — Encounter: Payer: Self-pay | Admitting: Gastroenterology

## 2024-03-04 ENCOUNTER — Other Ambulatory Visit: Payer: Self-pay | Admitting: *Deleted

## 2024-03-04 ENCOUNTER — Encounter: Payer: Self-pay | Admitting: Family Medicine

## 2024-03-04 ENCOUNTER — Encounter: Payer: Self-pay | Admitting: Internal Medicine

## 2024-03-04 ENCOUNTER — Encounter: Payer: Self-pay | Admitting: Family

## 2024-03-04 DIAGNOSIS — L659 Nonscarring hair loss, unspecified: Secondary | ICD-10-CM

## 2024-03-04 DIAGNOSIS — E1165 Type 2 diabetes mellitus with hyperglycemia: Secondary | ICD-10-CM

## 2024-03-04 MED ORDER — TIRZEPATIDE 15 MG/0.5ML ~~LOC~~ SOAJ: 15.0000 mg | SUBCUTANEOUS | 1 refills | Status: AC

## 2024-03-04 MED ORDER — MINOXIDIL 2.5 MG PO TABS: 2.5000 mg | ORAL_TABLET | Freq: Every day | ORAL | 1 refills | Status: DC

## 2024-03-04 MED ORDER — CYANOCOBALAMIN 1000 MCG/ML IJ SOLN: 1000.0000 ug | INTRAMUSCULAR | 4 refills | Status: AC

## 2024-03-04 MED ORDER — QUETIAPINE FUMARATE 300 MG PO TABS: 300.0000 mg | ORAL_TABLET | Freq: Every day | ORAL | 1 refills | Status: AC

## 2024-03-04 MED ORDER — FENOFIBRATE 145 MG PO TABS: 145.0000 mg | ORAL_TABLET | Freq: Every day | ORAL | 1 refills | Status: AC

## 2024-03-04 MED ORDER — ROSUVASTATIN CALCIUM 20 MG PO TABS: 20.0000 mg | ORAL_TABLET | Freq: Every day | ORAL | 1 refills | Status: AC

## 2024-03-05 ENCOUNTER — Other Ambulatory Visit: Payer: Self-pay | Admitting: Neurology

## 2024-03-05 MED ORDER — TOPIRAMATE 100 MG PO TABS: 100.0000 mg | ORAL_TABLET | Freq: Every day | ORAL | 11 refills | Status: AC

## 2024-03-05 MED ORDER — NURTEC 75 MG PO TBDP: 75.0000 mg | ORAL_TABLET | ORAL | 11 refills | Status: AC | PRN

## 2024-03-05 MED ORDER — MINOXIDIL 2.5 MG PO TABS: 2.5000 mg | ORAL_TABLET | Freq: Every day | ORAL | 0 refills | Status: DC

## 2024-03-05 MED ORDER — AIMOVIG 140 MG/ML ~~LOC~~ SOAJ: SUBCUTANEOUS | 11 refills | Status: AC

## 2024-03-06 MED ORDER — MIDODRINE HCL 2.5 MG PO TABS: 2.5000 mg | ORAL_TABLET | Freq: Three times a day (TID) | ORAL | 3 refills | Status: DC

## 2024-03-06 NOTE — Telephone Encounter (Signed)
 Can you look into her xolair  refills/approval?  Thanks!

## 2024-03-06 NOTE — Addendum Note (Signed)
 Addended by: MARDY LEOTIS RAMAN on: 03/06/2024 03:05 PM   Modules accepted: Orders

## 2024-03-06 NOTE — Addendum Note (Signed)
 Addended by: MARDY LEOTIS RAMAN on: 03/06/2024 03:12 PM   Modules accepted: Orders

## 2024-03-07 ENCOUNTER — Ambulatory Visit (INDEPENDENT_AMBULATORY_CARE_PROVIDER_SITE_OTHER): Admitting: Professional

## 2024-03-07 ENCOUNTER — Encounter: Payer: Self-pay | Admitting: Professional

## 2024-03-07 ENCOUNTER — Other Ambulatory Visit: Payer: Self-pay

## 2024-03-07 ENCOUNTER — Other Ambulatory Visit: Payer: Self-pay | Admitting: *Deleted

## 2024-03-07 DIAGNOSIS — F840 Autistic disorder: Secondary | ICD-10-CM | POA: Diagnosis not present

## 2024-03-07 DIAGNOSIS — F423 Hoarding disorder: Secondary | ICD-10-CM | POA: Diagnosis not present

## 2024-03-07 DIAGNOSIS — F411 Generalized anxiety disorder: Secondary | ICD-10-CM

## 2024-03-07 DIAGNOSIS — F422 Mixed obsessional thoughts and acts: Secondary | ICD-10-CM | POA: Diagnosis not present

## 2024-03-07 MED ORDER — OMALIZUMAB 150 MG/ML ~~LOC~~ SOSY: 300.0000 mg | PREFILLED_SYRINGE | SUBCUTANEOUS | 3 refills | Status: DC

## 2024-03-07 NOTE — Progress Notes (Signed)
 [facility] Counselor/Therapist Progress Note  Patient ID: Amber [name], MRN: [id number],    Date: 03/07/2024  Time Spent: 57 minutes 2-257pm  Treatment Type: Individual Therapy  Risk Assessment: Danger to Self:  No Self-injurious Behavior: No Danger to Others: No   Subjective:  This session was held via video teletherapy. The patient consented to video teletherapy and was located at her home during this session. She is aware it is the responsibility of the patient to secure confidentiality on her end of the session. The provider was in a private home office for the duration of this session.    The patient arrived on time for her Caregility session.  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 she has managed all of her own medical -only time pt had support of family of origin was at age 80 when she was in pt for surgeries -of the six children she was the middle child, oldest daughter -she lived with her mother and father the longest, until age 72) and she financially contributed -pt thinks her mother has ASD, she is brilliant having 3 college degrees but has no social skills -sister, two years younger, was dx with ADHD when she was a child -female child number 2 was diagnosed bipolar and her mother was as well -the pt's mother was adopted and the family lived in [place] and her adoptive parents were in [place] -her  mother did not have a healthy childhood and into adulthood -pt's maternal uncle was the golden child -the pt admits that the same complaints her mother had at end of life her mother was doing the same things -there has been no contact since her mother moved out of her home a month ago after five years with the pt -her siblings have not been in touch thought her mother calls and she will talk to her 5 niece Ila and 41 year old nephew; pt has 8 nephews and 1 niece  Interventions: see attached treatment plan  Diagnosis:Generalized anxiety disorder  Autism spectrum disorder  Mixed obsessional thoughts and acts  Hoarding disorder  Plan:  -journaling thoughts, feelings, and behaviors -work on sleep hygiene meet again on Tuesday, April 02, 2024 at 1pm.  Rollo L. Hudson, [facility]    Behavioral Health Treatment Plan   Name:Amber [name] [place]  MRN: [id number]  Treatment Plan Development Date: 02/27/2024  Strengths: Spirituality, Hopefulness, Self Advocate, Able to Communicate Effectively, and supportive spouse, pt is helpful  Supports: spouse and friend in [facility] [place]  Client Statement of Needs: Needing a little help with dealing with my anxiety and setting boundaries. The anxiety changes a lot, it's anxiety about leaving the house, house not being clean enough, animals not being taken care of, is my mom being taken care of, is my husband taken care of...  Treatment Level: Outpatient Individual Therapy  Client Treatment Preferences: virtual, in afternoons  Diagnosis: Autism  Autism spectrum disorder  Symptoms:  Persistent deficits in reciprocal  social communication and social interaction across multiple contexts.Deficits in social-emotional reciprocity, ranging, for example, from abnormal social approach and failure of normal back and forth conversation; to reduced sharing of interests, emotions, or affect; failure to initiate or respond to social interactions.,  Deficits in nonverbal communicative behaviors used to social interaction, ranging, for example, from poorly integrated verbal and nonverbal communication; to abnormalities in eye contact and body language, or deficits in understanding or use of gestures; to a total lack of facial expressions and non-verbal communication., and Deficits in developing, maintaining, and understanding relationships, ranging, fore example, from difficulties adjusting behavior to suit various social contexts; to difficulties sharing imaginative play or in making friends; to absence of interest in peers. and Restricted, repetitive patterns of behavior, interests, or activities, as manifested by at least two of the following, currently or by history (examples are illustrative, not exhaustive; see text)Stereotyped or repetitive motor movements, use of objects, or speech (e.g., simple motor stereotypies, lining up toys or flipping objects, echolalia, idiosyncratic phrases)., Insistence on sameness, inflexible adherence to routines, or ritualized patterns or verbal nonverbal behavior (e.g., extreme distress at small changes, difficulties with transitions, rigid thinking patterns, greeting rituals, need to take same route or eat food every day)., and Highly restricted, fixated interests that are abnormal in intensity or focus (e.g, strong attachment to or preoccupation with unusual objects, excessively circumscribed or perseverative interest).  Goals:  Build flexibility in changes in daily routine or environment., Bolster coping skills to improve distress tolerance., Develop consistent daily routine to support mood., Build and maintain achievable levels of functioning., Develop a better understanding of Autism Spectrum Disorder., and Build and maintain daily routine including self-care.  Objectives: Target Date For All Objectives: 02/26/2025  Identify and challenge negative cognitions and behaviors that contribute distress and poor  functioning., Develop and maintain a consistent self-care routine and daily schedule., Identify and address interpersonal stressors via learned communication skills., Improve distress tolerances regarding day-to-day stressors, interpersonal stressors, work, and education stressors., Develop coping tools to manage distress including mindfulness, acceptance, relaxation, reframing, and challenging negative thoughts and feelings., and Develop and maintain mindfulness regarding gratitude towards self and others, recognizing positives in day-to-day life and relationships.  Progress Documentation:  New patient, initial plan  Interventions:  Psychoeducation regarding diagnoses and treatment, Rapport Building, Motivational Interviewing, CBT - reframing, challenging, cognitive restructuring, Behavioral Activation, Relaxation and mindfulness, Distress Tolerance, Communication skills - conflict resolution, Social skills, Assertiveness, Self-Care - exercise, sleep, nutrition, Problem Solving, Role Play, Solution Focused, Strength-based, and Anger Management Training  Diagnosis: Anxiety  Generalized anxiety disorder  Symptoms:  Excessive and/or unrealistic worry that is difficult to control occurring more days than not for at least 6 months about a number of events or activities, Difficulty managing worry, Restlessness or feeling keyed up or on edge, Being easily fatigued, Difficulty concentrating or mind going blank, Irritability, and Sleep disturbance (Difficulty falling or staying asleep, restlessness, or unsatisfying sleep  Goals:  Improve daily functioning by reducing overall anxiety symptoms including intensity, frequency, and duration of symptoms., Resolve issues and concerns that are resulting in anxiety., Build and employ tools and skills to reduce symptoms of anxiety, worry, and improve functioning day-to-day., and Address negative cognitions, distortions, and behaviors that contribute to anxiety  symptoms and affect overall functioning.  Objectives: Target Date For All Objectives: 02/26/2025  Express understanding of anxiety diagnosis, treatment approaches and the need to address the thoughts, feelings, behaviors, and physiological symptoms associated with the symptoms and diagnosis., Develop coping tools to  manage anxiety including mindfulness, acceptance, relaxation, reframing, and challenging negative thoughts and feelings., Identify, challenge, and manage negative self-talk, negative thinking about self and others, and maintain mindfulness regarding self-talk and cognitive distortions., Develop consistent self-care routine including exercise, healthful eating, consistent sleep., Identify contributing factors to current anxiety including family of origin issues, past trauma, significant stressors, and negative cognitions., Participate in exposure therapy., Identify contributing factors to anxiety including previous or current situations., and Maintain mindfulness of symptoms and develop protocol to address symptoms to prevent relapse.  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 disorder  Symptoms:  Recurrent and persistent thoughts, urges or images that are experienced, at some time during the disturbance, as intrusive, unwanted, and that in most individuals cause marked anxiety or distress., The individual attempts to ignore or suppress such thoughts, urges, or images, or  to neutralize them with some thought or action (i.e., by performing a compulsion)., Repetitive behaviors (e.g., hand washing, ordering checking) or mental acts (e.g., praying, counting, repeating words silently) that the person feels driven to perform in response to an obsession, or according to the rules that must be applied rigidly., The obsessions or compulsions are time consuming (e.g., take more than 1 hour per day) or cause clinically significant distress or impairment in social, occupational, or other important areas of functioning., The disturbance is not better explained by the symptoms of another mental disorder (e.g., excessive worries, as in generalized anxiety disorder; preoccupation with appearance, as in body dysmorphic disorder; difficulty discarding or parting with possession, as in hoarding disorder; hair pulling, as in trichotillomania [hair-pulling disorder]; skin picking, as in excoriation [skin-picking] disorder); stereotypies, as in stereotypic movement disorder; ritualized eating behavior, as in eating disorders; preoccupation with substances or gambling, as in substance-related and addictive disorders; sexual urges or fantasies, as in paraphilic disorders; impulses, as in disruptive, impulse-control, and conduct disorders; guilty ruminations, as in major depressive disorder; thought insertion or delusional preoccupations, as in schizophrenia spectrum and other psychotic disorders; or repetitive patterns of behavior, as in autism spectrum disorder)., The disturbance is not due to the direct physiological effects of a substance (e.g., drug of abuse, a medication) or a general medical condition., and With good or fair insight: The individual recognizes that obsessive-compulsive beliefs are definitely or probably not true or that they may or may not be true.  Goals:  Alleviate OCD symptoms to return to effective functioning., Recognize, accept, and cope with OCD Symptoms and feelings., Gain  control over intrusive thoughts and Compulsions., Gain acceptance regarding intrusive thoughts and anxieties and work towards not responding to them., and Resist urge to engage in Compulsive behaviors.  Objectives: Target Date For All Objectives: 02/26/2025  Describe experiences with OCD including impact on functioning and attempts to resolve., Openly discuss symptoms of OCD develop alternative strategies to manage., Openly discuss Obsessive thoughts and establish alternative coping skills., Verbalize an accurate understanding of OCD., Learn and implement strategies to overcome OCD., Explore interpersonal problems and how to resolve to assist in improving mood., Learn and implement problem-solving., Learn and implement relaxation, mindfulness, and distress tolerance skills., Learn and implement decision-making skills., Learn and implement relapse prevention skills.,  Implement mindfulness for relapse prevention., Verbalize an understanding of healthy and unhealthy emotions and increase the use of healthy emotions., Use mindfulness and acceptance strategies to reduce experiential and cognitive distress and increase value-based behavior., and Increasingly verbalize hopeful and positive statements regarding self, others, and the future.  Progress Documentation:   New patient, initial plan  Interventions:  Psychoeducation regarding diagnoses and treatment, Rapport Building, Motivational Interviewing, CBT - reframing, challenging, cognitive restructuring, Behavioral Activation, Relaxation and mindfulness, Distress Tolerance, Communication skills - conflict resolution, Social skills, Assertiveness, Self-Care - exercise, sleep, nutrition, Problem Solving, Exposure Therapy, Role Play, Solution Focused, and Strength-based   Expected duration of treatment: one year  Party responsible for implementation of interventions: Rollo L. Gerome, [facility]   This plan has been reviewed and created by the following  participants: Rollo L. Gerome, [facility], and patient Aija Scarfo   A new plan will be created at least every 12 months.  The patient fully participated in the development of treatment plan with the clinician and verbally consents to such treatment.   Patient Treatment Plan Signature Obtained: No, pending signature page.  Nathanel Gerome, [facility]

## 2024-03-08 ENCOUNTER — Other Ambulatory Visit: Payer: Self-pay

## 2024-03-08 ENCOUNTER — Emergency Department (HOSPITAL_BASED_OUTPATIENT_CLINIC_OR_DEPARTMENT_OTHER)
Admission: EM | Admit: 2024-03-08 | Discharge: 2024-03-08 | Disposition: A | Discharge status: Home / Self Care | Attending: Emergency Medicine | Admitting: Emergency Medicine

## 2024-03-08 ENCOUNTER — Encounter (HOSPITAL_BASED_OUTPATIENT_CLINIC_OR_DEPARTMENT_OTHER): Payer: Self-pay | Admitting: Emergency Medicine

## 2024-03-08 DIAGNOSIS — Z79899 Other long term (current) drug therapy: Secondary | ICD-10-CM | POA: Diagnosis not present

## 2024-03-08 DIAGNOSIS — Z91010 Allergy to peanuts: Secondary | ICD-10-CM | POA: Diagnosis not present

## 2024-03-08 DIAGNOSIS — G43809 Other migraine, not intractable, without status migrainosus: Secondary | ICD-10-CM | POA: Insufficient documentation

## 2024-03-08 DIAGNOSIS — G90A Postural orthostatic tachycardia syndrome (POTS): Secondary | ICD-10-CM

## 2024-03-08 MED ORDER — DIPHENHYDRAMINE HCL 50 MG/ML IJ SOLN
12.5000 mg | Freq: Once | INTRAMUSCULAR | Status: AC
  Administered 2024-03-08: 12.5 mg via INTRAVENOUS
  Filled 2024-03-08: qty 1

## 2024-03-08 MED ORDER — DEXAMETHASONE SOD PHOSPHATE PF 10 MG/ML IJ SOLN
10.0000 mg | Freq: Once | INTRAMUSCULAR | Status: AC
  Administered 2024-03-08: 10 mg via INTRAVENOUS

## 2024-03-08 MED ORDER — HYDROMORPHONE HCL 1 MG/ML IJ SOLN
0.5000 mg | Freq: Once | INTRAMUSCULAR | Status: AC
  Administered 2024-03-08: 0.5 mg via INTRAVENOUS
  Filled 2024-03-08: qty 1

## 2024-03-08 MED ORDER — METOCLOPRAMIDE HCL 5 MG/ML IJ SOLN
10.0000 mg | Freq: Once | INTRAMUSCULAR | Status: AC
  Administered 2024-03-08: 10 mg via INTRAVENOUS
  Filled 2024-03-08: qty 2

## 2024-03-08 MED ORDER — SODIUM CHLORIDE 0.9 % IV SOLN
1000.0000 mL | INTRAVENOUS | Status: DC
  Administered 2024-03-08: 1000 mL via INTRAVENOUS

## 2024-03-08 MED ORDER — HYDROMORPHONE HCL 1 MG/ML IJ SOLN: 1.0000 mg | Freq: Once | INTRAMUSCULAR | Status: DC

## 2024-03-08 MED ORDER — SODIUM CHLORIDE 0.9 % IV BOLUS (SEPSIS)
1000.0000 mL | Freq: Once | INTRAVENOUS | Status: AC
  Administered 2024-03-08: 1000 mL via INTRAVENOUS

## 2024-03-08 NOTE — ED Notes (Signed)
 Pt. Reports she has a normal Migraine today on her L side face and head.  Pt. Reports she usually get benadryl  , dialudid, decadron , reglin, and NS due to POTS.

## 2024-03-08 NOTE — ED Triage Notes (Addendum)
 Pt in with migraine, onset at 0400. Pt took prescribed Nurtec and Zofran  at that time, reports photophobia and n/v. Pain 8/10 currently, feels this is causing a POTS flare

## 2024-03-08 NOTE — ED Provider Notes (Cosign Needed)
 " [place] [facility] Provider Note   CSN: [id number] Arrival date & time: 03/08/24  1313     Patient presents with: Migraine   Amber [name] is a 43 y.o. female.   Patient complains of a migraine headache.  Patient states the migraines is triggering her POTS syndrome.  Patient reports that she began experiencing a headache last night she took her Nurtec without relief.  Patient has a history of migraines.  Patient reports she has previously had headaches which required her to come to the emergency department for treatment.  The history is provided by the patient. No language interpreter was used.  Migraine This is a recurrent problem. The current episode started 12 to 24 hours ago. The problem occurs constantly. Associated symptoms include headaches. Pertinent negatives include no chest pain and no shortness of breath. Nothing aggravates the symptoms. Nothing relieves the symptoms.       Prior to Admission medications  Medication Sig Start Date End Date Taking? Authorizing Provider  busPIRone  (BUSPAR ) 7.5 MG tablet Take 1 tab by mouth 3 times daily as needed for anxiety. 01/23/24   Frann Mabel Mt, DO  cabergoline  (DOSTINEX ) 0.5 MG tablet 2.5 mg weekly (Two tabs on Mondays, 1 tab on wednesdays and 2 tabs on Fridays) 01/09/23   Shamleffer, Ibtehal Jaralla, MD  cetirizine (ZYRTEC) 10 MG chewable tablet Chew 10 mg by mouth in the morning and at bedtime.    [provider]  Cholecalciferol (TRUE VITAMIN D3) 1.25 MG (50000 UT) capsule Take 1 capsule (50,000 Units total) by mouth daily. 10/11/23   Frann Mabel Mt, DO  Continuous Glucose Sensor (DEXCOM G7 SENSOR) MISC USE 1 SENSOR TOPICALLY EVERY 10 DAYS 12/25/23   Frann Mabel Mt, DO  cromolyn  (GASTROCROM ) 100 MG/5ML solution Take 5 mLs (100 mg total) by mouth 4 (four) times daily -  before meals and at bedtime. 10/10/23   Lorin Norris, MD  cyanocobalamin  (VITAMIN B12)  1000 MCG/ML injection Inject 1 mL (1,000 mcg total) into the muscle every 14 (fourteen) days. 03/04/24   Tonette Lauraine HERO, PA-C  dexlansoprazole  (DEXILANT ) 60 MG capsule Take 1 capsule (60 mg total) by mouth daily. 07/24/23   Cirigliano, Vito V, DO  EPINEPHrine  (EPIPEN  2-PAK) 0.3 mg/0.3 mL IJ SOAJ injection Inject 0.3 mg into the muscle as needed for anaphylaxis. 06/14/23   Frann Mabel Mt, DO  Erenumab -aooe (AIMOVIG ) 140 MG/ML SOAJ Inject 140 mg as directed subcutaneously every 4 weeks 03/05/24   Skeet Juliene SAUNDERS, DO  escitalopram  (LEXAPRO ) 20 MG tablet Take 1 tablet (20 mg total) by mouth daily. 01/23/24   Frann Mabel Mt, DO  famotidine  (PEPCID ) 20 MG tablet Take 20 mg by mouth 2 (two) times daily.    [provider]  fenofibrate  (TRICOR ) 145 MG tablet Take 1 tablet (145 mg total) by mouth daily. 03/04/24   Frann Mabel Mt, DO  folic acid  (FOLVITE ) 1 MG tablet Take 1 tablet (1 mg total) by mouth daily. 05/23/23   Tonette Lauraine HERO, PA-C  glucose blood (ACCU-CHEK GUIDE TEST) test strip Use to Check Blood [place] 09/06/23   Frann Mabel Mt, DO  ipratropium (ATROVENT ) 0.06 % nasal spray Place 2 sprays into both nostrils 4 (four) times daily. 09/29/23   Lorin Norris, MD  Menaquinone-7 (VITAMIN K2) 100 MCG CAPS Take 1 Dose by mouth daily.    [provider]  midodrine  (PROAMATINE ) 2.5 MG tablet Take 1 tablet (2.5 mg total) by mouth 3 (three) times daily  with meals. 03/06/24   Corey, Evan S, MD  omalizumab  (XOLAIR ) 150 MG/ML prefilled syringe Inject 300 mg into the skin every 28 (twenty-eight) days. 03/07/24   Lorin Norris, MD  ondansetron  (ZOFRAN -ODT) 8 MG disintegrating tablet Take 1 tablet (8 mg total) by mouth every 8 (eight) hours as needed for nausea or vomiting. 09/06/23   Skeet Juliene SAUNDERS, DO  QUEtiapine  (SEROQUEL ) 300 MG tablet Take 1 tablet (300 mg total) by mouth at bedtime. 03/04/24   Frann Mabel Mt, DO  Riboflavin 400 MG CAPS Take 400 mg  by mouth daily.    [provider]  Rimegepant Sulfate (NURTEC) 75 MG TBDP Take 1 tablet (75 mg total) by mouth as needed (Daily as needed for a Migraine). Maximum 1 tablet in 24 hours.  Quantity 8. 03/05/24   Skeet Juliene SAUNDERS, DO  rosuvastatin  (CRESTOR ) 20 MG tablet Take 1 tablet (20 mg total) by mouth daily. 03/04/24   Frann Mabel Mt, DO  Syringe/Needle, Disp, 25G X 1 1 ML MISC 1 Dose by Does not apply route every 14 (fourteen) days. 08/31/23   Tonette Lauraine HERO, PA-C  tirzepatide  (MOUNJARO ) 15 MG/0.5ML Pen Inject 15 mg into the skin once a week. 03/04/24   Frann Mabel Mt, DO  topiramate  (TOPAMAX ) 100 MG tablet Take 1 tablet (100 mg total) by mouth at bedtime. 03/05/24   Skeet Juliene SAUNDERS, DO  traMADol  (ULTRAM ) 50 MG tablet TAKE ONE TABLET BY MOUTH EVERY 6 HOURS AS NEEDED 02/26/24   Skeet Juliene SAUNDERS, DO    Allergies: Bee venom, Chlorhexidine, Ibuprofen, Iodine, Peanut-containing drug products, Sesame seed (diagnostic), Shellfish allergy , Soybean-containing drug products, Sulfa antibiotics, Triptans, Lunesta  [eszopiclone ], Almond (diagnostic), Anacardium occidentale, Aspirin, Hazelnut (filbert), Nsaids, Tape, Walnut, and Wheat    Review of Systems  Respiratory:  Negative for shortness of breath.   Cardiovascular:  Negative for chest pain.  Neurological:  Positive for headaches.  All other systems reviewed and are negative.   Updated Vital Signs BP 97/63 (BP Location: Left Arm)   Pulse (!) 102   Temp 98 F (36.7 C) (Oral)   Resp 18   Wt 83.5 kg   LMP 02/23/2024   SpO2 99%   BMI 29.70 kg/m   Physical Exam Vitals and nursing note reviewed.  Constitutional:      Appearance: She is well-developed.  HENT:     Head: Normocephalic.  Cardiovascular:     Rate and Rhythm: Normal rate.  Pulmonary:     Effort: Pulmonary effort is normal.  Abdominal:     General: Abdomen is flat. There is no distension.     Palpations: Abdomen is soft.  Musculoskeletal:        General:  Normal range of motion.     Cervical back: Normal range of motion.  Skin:    General: Skin is warm.  Neurological:     General: No focal deficit present.     Mental Status: She is alert and oriented to person, place, and time.  Psychiatric:        Mood and Affect: Mood normal.     (all labs ordered are listed, but only abnormal results are displayed) Labs Reviewed - No data to display  EKG: None  Radiology: No results found.   Procedures   Medications Ordered in the ED  sodium chloride  0.9 % bolus 1,000 mL (has no administration in time range)    Followed by  0.9 %  sodium chloride  infusion (has no administration in time range)  metoCLOPramide  (REGLAN ) injection 10 mg (has no administration in time range)  diphenhydrAMINE  (BENADRYL ) injection 12.5 mg (has no administration in time range)  dexamethasone  (DECADRON ) injection 10 mg (has no administration in time range)  HYDROmorphone  (DILAUDID ) injection 1 mg (has no administration in time range)                                    Medical Decision Making Patient complains of a migraine and an exacerbation of her POTS.  Patient reports this is her typical symptoms.  Risk Prescription drug management. Risk Details: Pt given Iv ns x  1.5 liter. Reglan , benadryl , decadron  and dilaudid .  (Pt reports this is what normally works for her) Pt reports feeling better.          Final diagnoses:  Other migraine without status migrainosus, not intractable    ED Discharge Orders     None      An After Visit Summary was printed and given to the patient.     Flint Sonny POUR, PA-C 03/08/24 1608  "

## 2024-03-13 ENCOUNTER — Inpatient Hospital Stay: Attending: Medical Oncology

## 2024-03-13 VITALS — BP 94/64 | HR 92 | Temp 98.2°F | Resp 20

## 2024-03-13 DIAGNOSIS — E538 Deficiency of other specified B group vitamins: Secondary | ICD-10-CM | POA: Diagnosis present

## 2024-03-13 DIAGNOSIS — D509 Iron deficiency anemia, unspecified: Secondary | ICD-10-CM

## 2024-03-13 MED ORDER — CYANOCOBALAMIN 1000 MCG/ML IJ SOLN
1000.0000 ug | Freq: Once | INTRAMUSCULAR | Status: AC
  Administered 2024-03-13: 1000 ug via INTRAMUSCULAR
  Filled 2024-03-13: qty 1

## 2024-03-13 NOTE — Patient Instructions (Signed)
 Vitamin B12 Deficiency Vitamin B12 deficiency means that your body does not have enough vitamin B12. The body needs this important vitamin: To make red blood cells. To make genes (DNA). To help the nerves work. If you do not have enough vitamin B12 in your body, you can have health problems, such as not having enough red blood cells in the blood (anemia). What are the causes? Not eating enough foods that contain vitamin B12. Not being able to take in (absorb) vitamin B12 from the food that you eat. Certain diseases. A condition in which the body does not make enough of a certain protein. This results in your body not taking in enough vitamin B12. Having a surgery in which part of the stomach or small intestine is taken out. Taking medicines that make it hard for the body to take in vitamin B12. These include: Heartburn medicines. Some medicines that are used to treat diabetes. What increases the risk? Being an older adult. Eating a vegetarian or vegan diet that does not include any foods that come from animals. Not eating enough foods that contain vitamin B12 while you are pregnant. Taking certain medicines. Having alcoholism. What are the signs or symptoms? In some cases, there are no symptoms. If the condition leads to too few blood cells or nerve damage, symptoms can occur, such as: Feeling weak or tired. Not being hungry. Losing feeling (numbness) or tingling in your hands and feet. Redness and burning of the tongue. Feeling sad (depressed). Confusion or memory problems. Trouble walking. If anemia is very bad, symptoms can include: Being short of breath. Being dizzy. Having a very fast heartbeat. How is this treated? Changing the way you eat and drink, such as: Eating more foods that contain vitamin B12. Drinking little or no alcohol. Getting vitamin B12 shots. Taking vitamin B12 supplements by mouth (orally). Your doctor will tell you the dose that is best for you. Follow  these instructions at home: Eating and drinking  Eat foods that come from animals and have a lot of vitamin B12 in them. These include: Meats and poultry. This includes beef, pork, chicken, [place], and organ meats, such as liver. Seafood, such as clams, rainbow trout, salmon, tuna, and haddock. Eggs. Dairy foods such as milk, yogurt, and cheese. Eat breakfast cereals that have vitamin B12 added to them (are fortified). Check the label. The items listed above may not be a complete list of foods and beverages you can eat and drink. Contact a dietitian for more information. Alcohol use Do not drink alcohol if: Your doctor tells you not to drink. You are pregnant, may be pregnant, or are planning to become pregnant. If you drink alcohol: Limit how much you have to: 0-1 drink a day for women. 0-2 drinks a day for men. Know how much alcohol is in your drink. In the [place] one drink equals one 12 oz bottle of beer (355 mL), one 5 oz glass of wine (148 mL), or one 1 oz glass of hard liquor (44 mL). General instructions Get any vitamin B12 shots if told by your doctor. Take supplements only as told by your doctor. Follow the directions. Keep all follow-up visits. Contact a doctor if: Your symptoms come back. Your symptoms get worse or do not get better with treatment. Get help right away if: You have trouble breathing. You have a very fast heartbeat. You have chest pain. You get dizzy. You faint. These symptoms may be an emergency. Get help right away. Call 911.  Do not wait to see if the symptoms will go away. Do not drive yourself to the hospital. Summary Vitamin B12 deficiency means that your body is not getting enough of the vitamin. In some cases, there are no symptoms of this condition. Treatment may include making a change in the way you eat and drink, getting shots, or taking supplements. Eat foods that have vitamin B12 in them. This information is not intended to replace advice  given to you by your health care provider. Make sure you discuss any questions you have with your health care provider. Document Revised: 10/30/2020 Document Reviewed: 10/30/2020 Elsevier Patient Education  2024 [employer].

## 2024-03-19 ENCOUNTER — Encounter: Payer: Self-pay | Admitting: Neurology

## 2024-03-30 ENCOUNTER — Encounter: Payer: Self-pay | Admitting: Family Medicine

## 2024-04-01 ENCOUNTER — Encounter: Payer: Self-pay | Admitting: Internal Medicine

## 2024-04-01 ENCOUNTER — Ambulatory Visit: Admitting: Internal Medicine

## 2024-04-01 VITALS — BP 88/58 | HR 78 | Resp 16

## 2024-04-01 DIAGNOSIS — Q796 Ehlers-Danlos syndrome, unspecified: Secondary | ICD-10-CM

## 2024-04-01 DIAGNOSIS — G90A Postural orthostatic tachycardia syndrome (POTS): Secondary | ICD-10-CM

## 2024-04-01 DIAGNOSIS — J3089 Other allergic rhinitis: Secondary | ICD-10-CM

## 2024-04-01 DIAGNOSIS — L501 Idiopathic urticaria: Secondary | ICD-10-CM

## 2024-04-01 DIAGNOSIS — D8940 Mast cell activation, unspecified: Secondary | ICD-10-CM | POA: Diagnosis not present

## 2024-04-01 DIAGNOSIS — K529 Noninfective gastroenteritis and colitis, unspecified: Secondary | ICD-10-CM | POA: Diagnosis not present

## 2024-04-01 NOTE — Patient Instructions (Addendum)
"   Anaphylaxis/MCAS/POTS/ Urticaria - improved  Allergy  test: negative to all foods, positive to grass, weed, mold  -tryptase, alpha gal labs were negative  -CU index > 50  -Continue to avoid sesame, soy, wheat, nuts for now   -Written allergy  action plan  -Continue to carry EpiPen  autoinjector -Contineu cetirizine 10 mg twice a day, Pepcid  20 mg twice a day - Continue gastrocrom  100mg  three times a day for persistent GI symptoms  - We will look at moving xolair  to every 2 weeks   -continue home dosing at every 4 weeks until we get every 2 approved     [place]  Continue Atrovent  (ipatopium) nasal spray 1-2 sprays in each nostril up to three times daily AS NEEDED for POST NASAL DRIP/RUNNY NOSE/DRAINAGE.  If you become too dry, use less often.   Follow up: 6 months   Thank you so much for letting me partake in your care today.  Don't hesitate to reach out if you have any additional concerns!  Hargis Springer, MD  [facility] [place] [place] "

## 2024-04-01 NOTE — Telephone Encounter (Signed)
 Has the POTS been any better at least?  If the medicine is making you feel worse and not actually helping we should probably stop it.

## 2024-04-01 NOTE — Progress Notes (Signed)
 "  FOLLOW UP Date of Service/Encounter:  04/01/2024  Subjective:  Amber [name] (DOB: 1980/12/18) is a 44 y.o. female who [facility] in re-evaluation of the following: MACS/urticaria/POTS/EDS/Rhinnorrhea  History obtained from: chart review and patient.  For Review, LV was on 09/27/23 with Dr. Lorin seen for routine follow-up. See below for summary of history and diagnostics.   Therapeutic plans/changes recommended: Started Xolair  300 mg every 4 weeks  Today presents for follow-up. Discussed the use of AI scribe software for clinical note transcription with the patient, who gave verbal consent to proceed.  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 - Fewer food reactions since starting current regimen. - Increased sensitivity to her Rottweiler approximately one week before next Xolair  injection, resulting in urticarial breakouts.  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 - Presyncopal episode two nights ago upon standing, requiring assistance from her husband. - Rottweiler instinctively assists by pushing her into a sitting position during presyncopal episodes. - Multiple medications have been used for POTS management. - Currently managed by EDS provider (sports medicine).   Weight loss and general health - Lost 115 pounds since October 2024. - Improved overall well-being since weight loss.   Rhinitis  - controlled with nasal atrovent     All medications reviewed by clinical staff and updated in chart. No new pertinent medical or surgical history except as noted in HPI.  ROS: All others negative except as noted  per HPI.   Objective:  BP (!) 88/58   Pulse 78   Resp 16   LMP 02/23/2024   SpO2 98%  There is no height or weight on file to calculate BMI. Physical Exam: General Appearance:  Alert, cooperative, no distress, appears stated age  Head:  Normocephalic, without obvious abnormality, atraumatic  Eyes:  Conjunctiva clear, EOM's intact  Ears EACs normal bilaterally  Nose: Nares normal, normal mucosa, no visible anterior polyps, and septum midline  Throat: Lips, tongue normal; teeth and gums normal, + cobblestoning  Neck: Supple, symmetrical  Lungs:   clear to auscultation bilaterally, Respirations unlabored, no coughing  Heart:  regular rate and rhythm and no murmur, Appears well perfused  Extremities: No edema  Skin: Skin color, texture, turgor normal and no rashes or lesions on visualized portions of skin  Neurologic: No gross deficits   Labs:  Lab Orders  No laboratory test(s) ordered today     Assessment/Plan   Patient Instructions   Anaphylaxis/MCAS/POTS/ Urticaria - improved  Allergy  test: negative to all foods, positive to grass, weed, mold  -tryptase, alpha gal labs were negative  -CU index > 50  -Continue to avoid sesame, soy, wheat, nuts for now   -Written allergy  action plan  -Continue to carry EpiPen  autoinjector -Contineu cetirizine 10 mg twice a day, Pepcid  20 mg twice a day - Continue gastrocrom  100mg  three times a day for persistent GI symptoms  - We will look at moving xolair  to every 2 weeks   -continue home dosing at every 4 weeks until we get every 2 approved     Rhinnorhea  Continue Atrovent  (ipatopium) nasal spray 1-2 sprays in each nostril up to three times daily AS NEEDED for POST NASAL DRIP/RUNNY NOSE/DRAINAGE.  If you become too dry, use less often.   Follow up: 6 months   Thank you so much for letting me partake in your care today.  Don't hesitate to reach out if you have any additional concerns!  Hargis Springer, MD  [facility] [place], [place]  Other:    Thank you so much for letting me partake in your care today.  Don't hesitate to reach out if you have any additional concerns!  Hargis Springer, MD  [facility] [place], [place]        "

## 2024-04-01 NOTE — Telephone Encounter (Signed)
 Forwarding to Dr. Denyse Amass to review and advise.

## 2024-04-02 ENCOUNTER — Encounter: Payer: Self-pay | Admitting: Professional

## 2024-04-02 ENCOUNTER — Ambulatory Visit: Admitting: Professional

## 2024-04-02 ENCOUNTER — Telehealth: Payer: Self-pay | Admitting: *Deleted

## 2024-04-02 DIAGNOSIS — F411 Generalized anxiety disorder: Secondary | ICD-10-CM | POA: Diagnosis not present

## 2024-04-02 DIAGNOSIS — F840 Autistic disorder: Secondary | ICD-10-CM | POA: Diagnosis not present

## 2024-04-02 DIAGNOSIS — F422 Mixed obsessional thoughts and acts: Secondary | ICD-10-CM

## 2024-04-02 DIAGNOSIS — F423 Hoarding disorder: Secondary | ICD-10-CM | POA: Diagnosis not present

## 2024-04-02 MED ORDER — OMALIZUMAB 150 MG/ML ~~LOC~~ SOSY: 300.0000 mg | PREFILLED_SYRINGE | SUBCUTANEOUS | 3 refills | Status: AC

## 2024-04-02 NOTE — Telephone Encounter (Signed)
 L/m for patient to contact me to advise that no approval required but not sure that [facility] with cover increased dosing will have to send Rx and patient can followup on same

## 2024-04-02 NOTE — Progress Notes (Signed)
 "  [facility] Counselor/Therapist Progress Note  Patient ID: Amber [name], MRN: [id number],    Date: 04/02/2024  Time Spent: 56 minutes 103-159pm  Treatment Type: Individual Therapy  Risk Assessment: Danger to Self:  No Self-injurious Behavior: No Danger to Others: No   Subjective:  This session was held via video teletherapy. The patient consented to video teletherapy and was located at her home during this session. She is aware it is the responsibility of the patient to secure confidentiality on her end of the session. The provider was in a private home office for the duration of this session.    The patient arrived on time for her Caregility session.  Issues addressed: 1-homework not completed -journaling thoughts, feelings, and behaviors -work on sleep hygiene 2-medication changes -pt has been dealing with these changes and she is having issues with the medication working and difficulty with side effects 3-anxiety a-increased with medication issues and related to her mother 4-mother -sister called and mother called and each asked for pt to take her back -pt suggested she had to speak with her husband who said no -pt communicated to her sister and mother that the answer is no -pt admits carrying guilt and recognizes she was not her primary caregiver until age 47 she became her own by age 77 5-marital -since mother left their marriage is in better shape -she and husband decided that they will not  Interventions: see attached treatment plan  Diagnosis:Generalized anxiety disorder  Autism spectrum disorder  Mixed obsessional thoughts and acts  Hoarding disorder  Plan:  -setting boundaries regarding frequency of contact meet again on Monday, May 13, 2024 at 1pm.  Rollo L. [place], [facility]    Behavioral Health Treatment Plan   Name:Amber [name] [place]  MRN: [id number]  Treatment Plan Development Date:  02/27/2024  Strengths: Spirituality, Hopefulness, Self Advocate, Able to Communicate Effectively, and supportive spouse, pt is helpful  Supports: spouse and friend in [facility] [place] [facility]  Client Statement of Needs: Needing a little help with dealing with my anxiety and setting boundaries. The anxiety changes a lot, it's anxiety about leaving the house, house not being clean enough, animals not being taken care of, is my mom being taken care of, is my husband taken care of...  Treatment Level: Outpatient Individual Therapy  Client Treatment Preferences: virtual, in afternoons  Diagnosis: Autism  Autism spectrum disorder  Symptoms:  Persistent deficits in reciprocal social communication and social interaction across multiple contexts.Deficits in social-emotional reciprocity, ranging, for example, from abnormal social approach and failure of normal back and forth conversation; to reduced sharing of interests, emotions, or affect; failure to initiate or respond to social interactions., Deficits in nonverbal communicative behaviors used to social interaction, ranging, for example, from poorly integrated verbal and nonverbal communication; to abnormalities in eye contact and body language, or deficits in understanding or use of gestures; to a total lack of facial expressions and non-verbal communication., and Deficits in developing, maintaining, and understanding relationships, ranging, fore example, from difficulties adjusting behavior to suit various social contexts; to difficulties sharing imaginative play or in making friends; to absence of interest in peers. and Restricted, repetitive patterns of behavior, interests, or activities, as manifested by at least two of the following, currently or by history (examples are illustrative, not exhaustive; see text)Stereotyped or repetitive motor movements, use of objects, or speech (e.g., simple motor stereotypies, lining up toys or flipping objects, echolalia,  idiosyncratic phrases)., Insistence on sameness, inflexible adherence to routines, or ritualized  patterns or verbal nonverbal behavior (e.g., extreme distress at small changes, difficulties with transitions, rigid thinking patterns, greeting rituals, need to take same route or eat food every day)., and Highly restricted, fixated interests that are abnormal in intensity or focus (e.g, strong attachment to or preoccupation with unusual objects, excessively circumscribed or perseverative interest).  Goals:  Build flexibility in changes in daily routine or environment., Bolster coping skills to improve distress tolerance., Develop consistent daily routine to support mood., Build and maintain achievable levels of functioning., Develop a better understanding of Autism Spectrum Disorder., and Build and maintain daily routine including self-care.  Objectives: Target Date For All Objectives: 02/26/2025  Identify and challenge negative cognitions and behaviors that contribute distress and poor functioning., Develop and maintain a consistent self-care routine and daily schedule., Identify and address interpersonal stressors via learned communication skills., Improve distress tolerances regarding day-to-day stressors, interpersonal stressors, work, and education stressors., Develop coping tools to manage distress including mindfulness, acceptance, relaxation, reframing, and challenging negative thoughts and feelings., and Develop and maintain mindfulness regarding gratitude towards self and others, recognizing positives in day-to-day life and relationships.  Progress Documentation:  New patient, initial plan  Interventions:  Psychoeducation regarding diagnoses and treatment, Rapport Building, Motivational Interviewing, CBT - reframing, challenging, cognitive restructuring, Behavioral Activation, Relaxation and mindfulness, Distress Tolerance, Communication skills - conflict resolution, Social skills,  Assertiveness, Self-Care - exercise, sleep, nutrition, Problem Solving, Role Play, Solution Focused, Strength-based, and Anger Management Training  Diagnosis: Anxiety  Generalized anxiety disorder  Symptoms:  Excessive and/or unrealistic worry that is difficult to control occurring more days than not for at least 6 months about a number of events or activities, Difficulty managing worry, Restlessness or feeling keyed up or on edge, Being easily fatigued, Difficulty concentrating or mind going blank, Irritability, and Sleep disturbance (Difficulty falling or staying asleep, restlessness, or unsatisfying sleep  Goals:  Improve daily functioning by reducing overall anxiety symptoms including intensity, frequency, and duration of symptoms., Resolve issues and concerns that are resulting in anxiety., Build and employ tools and skills to reduce symptoms of anxiety, worry, and improve functioning day-to-day., and Address negative cognitions, distortions, and behaviors that contribute to anxiety symptoms and affect overall functioning.  Objectives: Target Date For All Objectives: 02/26/2025  Express understanding of anxiety diagnosis, treatment approaches and the need to address the thoughts, feelings, behaviors, and physiological symptoms associated with the symptoms and diagnosis., Develop coping tools to manage anxiety including mindfulness, acceptance, relaxation, reframing, and challenging negative thoughts and feelings., Identify, challenge, and manage negative self-talk, negative thinking about self and others, and maintain mindfulness regarding self-talk and cognitive distortions., Develop consistent self-care routine including exercise, healthful eating, consistent sleep., Identify contributing factors to current anxiety including family of origin issues, past trauma, significant stressors, and negative cognitions., Participate in exposure therapy., Identify contributing factors to anxiety including  previous or current situations., and Maintain mindfulness of symptoms and develop protocol to address symptoms to prevent relapse.  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 disorder  Symptoms:  Recurrent and persistent thoughts, urges or images that are experienced, at some time during the disturbance, as intrusive, unwanted, and that  in most individuals cause marked anxiety or distress., The individual attempts to ignore or suppress such thoughts, urges, or images, or to neutralize them with some thought or action (i.e., by performing a compulsion)., Repetitive behaviors (e.g., hand washing, ordering checking) or mental acts (e.g., praying, counting, repeating words silently) that the person feels driven to perform in response to an obsession, or according to the rules that must be applied rigidly., The obsessions or compulsions are time consuming (e.g., take more than 1 hour per day) or cause clinically significant distress or impairment in social, occupational, or other important areas of functioning., The disturbance is not better explained by the symptoms of another mental disorder (e.g., excessive worries, as in generalized anxiety disorder; preoccupation with appearance, as in body dysmorphic disorder; difficulty discarding or parting with possession, as in hoarding disorder; hair pulling, as in trichotillomania [hair-pulling disorder]; skin picking, as in excoriation  [skin-picking] disorder); stereotypies, as in stereotypic movement disorder; ritualized eating behavior, as in eating disorders; preoccupation with substances or gambling, as in substance-related and addictive disorders; sexual urges or fantasies, as in paraphilic disorders; impulses, as in disruptive, impulse-control, and conduct disorders; guilty ruminations, as in major depressive disorder; thought insertion or delusional preoccupations, as in schizophrenia spectrum and other psychotic disorders; or repetitive patterns of behavior, as in autism spectrum disorder)., The disturbance is not due to the direct physiological effects of a substance (e.g., drug of abuse, a medication) or a general medical condition., and With good or fair insight: The individual recognizes that obsessive-compulsive beliefs are definitely or probably not true or that they may or may not be true.  Goals:  Alleviate OCD symptoms to return to effective functioning., Recognize, accept, and cope with OCD Symptoms and feelings., Gain control over intrusive thoughts and Compulsions., Gain acceptance regarding intrusive thoughts and anxieties and work towards not responding to them., and Resist urge to engage in Compulsive behaviors.  Objectives: Target Date For All Objectives: 02/26/2025  Describe experiences with OCD including impact on functioning and attempts to resolve., Openly discuss symptoms of OCD develop alternative strategies to manage., Openly discuss Obsessive thoughts and establish alternative coping skills., Verbalize an accurate understanding of OCD., Learn and implement strategies to overcome OCD., Explore interpersonal problems and how to resolve to assist in improving mood., Learn and implement problem-solving., Learn and implement relaxation, mindfulness, and distress tolerance skills., Learn and implement decision-making skills., Learn and implement relapse prevention skills., Implement mindfulness for relapse  prevention., Verbalize an understanding of healthy and unhealthy emotions and increase the use of healthy emotions., Use mindfulness and acceptance strategies to reduce experiential and cognitive distress and increase value-based behavior., and Increasingly verbalize hopeful and positive statements regarding self, others, and the future.  Progress Documentation:   New patient, initial plan  Interventions:  Psychoeducation regarding diagnoses and treatment, Rapport Building, Motivational Interviewing, CBT - reframing, challenging, cognitive restructuring, Behavioral Activation, Relaxation and mindfulness, Distress Tolerance, Communication skills - conflict resolution, Social skills, Assertiveness, Self-Care - exercise, sleep, nutrition, Problem Solving, Exposure Therapy, Role Play, Solution Focused, and Strength-based   Expected duration of treatment: one year  Party responsible for implementation of interventions: Rollo L. Gerome, [facility]   This plan has been reviewed and created by the following participants: Rollo L. Gerome, [facility], and patient Yecenia Dalgleish   A new plan will be created at least every 12 months.  The patient fully participated in the development of treatment plan with the clinician and verbally consents to such treatment.   Patient Treatment Plan  Signature Obtained: No, pending signature page.  Nathanel Collet, [facility] "

## 2024-04-02 NOTE — Telephone Encounter (Signed)
-----   Message from Hargis Springer, MD sent at 04/01/2024  3:46 PM EST ----- Patient having breakthrough hives week 3-4 for Xolair , can we look at moving to every 2 weeks

## 2024-04-03 ENCOUNTER — Other Ambulatory Visit (HOSPITAL_COMMUNITY): Payer: Self-pay

## 2024-04-03 NOTE — Telephone Encounter (Signed)
 Patient advised of script with increased dosing to Meds by mail and will follow up on same

## 2024-04-04 ENCOUNTER — Ambulatory Visit: Admitting: Physical Therapy

## 2024-04-04 ENCOUNTER — Encounter: Payer: Self-pay | Admitting: Physical Therapy

## 2024-04-04 DIAGNOSIS — Z7409 Other reduced mobility: Secondary | ICD-10-CM

## 2024-04-04 DIAGNOSIS — M2550 Pain in unspecified joint: Secondary | ICD-10-CM

## 2024-04-04 NOTE — Therapy (Signed)
 "  OUTPATIENT PHYSICAL THERAPY HYPERMOBILITY Re-EVAL  Patient Name: Amber [name] MRN: [id number] DOB:Jan 10, 1981, 43 y.o., female Today's Date: 04/04/2024   SUBJECTIVE: Patient was seen on the [facility] location this past fall and had to stop coming due to of significant flareup.  She reports she continues to do some of the exercises she has made progress made with the hips. More back pain recently  POTS symptoms equal to EDS Presyncope.  Medication made her get migraines. Evenings will very lightheaded. Racing HR.  I have all of the print outs HEP does 2-3 days per week.  Does 2 x per day. Sometimes I walk the dog 2-3 x per week.      EVAL: Pt reports being worked up for about 5 yrs due to hand issues but does recall subluxing hips as a child.  Primary MD gave her some exercises. She has difficulty with stability and balance during functional activities at home.  Since losing wgt (95 lbs) she has had more joint pain.  Seeing Neuro soon for workup for tingling in feet and hands.  She has difficulty standing and walking (avoids shopping), activity in general.  She does walk the dogs every other day (1/2 mile).  Someday she can't make it the full time.  She has difficulty with lifting, hand function and gripping.  Compression gloves at night. She has cubital tunnel elbow braces but she does not wear them much.  She did OT for only 1 visit but did not return, was recommended to follow up about the hypermobility in her hands at that time.    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 about 1.5 yrs       Patient reports symptoms and/or instability in the following areas:  - Cervical Spine: [x] Pain [] Instability [] Limited ROM [] Paresthesia  - Thoracic Spine: [x] Pain [] Instability  - Lumbar Spine: [x] Pain [] Instability [] Frequent locking/giving out  - Shoulder: [] L [x] R  [x] Subluxation [] Dislocations [x] Pain [] Fatigue  - Elbow: [] L [] R  [] Instability  [] Hyperextension [] Pain  - Wrist/Hand: [x] L [x] R  [x] Instability [] Fatigue with use [x] Pain  - Hip: [x] L [x] R  [x] Instability [x] Pain [x] Clicking [] Gait deviations  - Knee: [x] L [x] R  [x] Instability [x] Hyperextension [x] Pain  - Ankle/Foot: [x] L [x] R  [x] Instability [] Frequent sprains Pain , Lt. 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, husband is disabled and she helps him with more executive functioning activities ____________________________   Patient-reported Beighton Score (if known): _____8__/9  Clinician-assessed Beighton Score (today): _____8__/9        PERTINENT HISTORY:  MD note:    MS: bilat hip pain w/ sublux in middle school, bilat hand pain and swelling, bilat shoulder instability R>L Skin/Immune reactions: stretchy skin, chronic urticaria Nervous system: dizziness esp w/ transitioning positions Head/Spine: chronic migraines, insomnia CV: tachycardia when transitioning to stand GI: GERD, chronic diarrhea, intermittent constipation , Genitourinary: none Hands & Feet: swell, paresthesia bilat hands and feet   PAIN:  Are you having pain?  Yes: NPRS scale: 7/10 Pain location: mid back across  Pain description: tightness and stabbing in the middle  Aggravating factors: poor posture Relieving factors: sitting with better posture, heating pad    Are you having pain?  Yes: NPRS scale:  4/10-5/10  Pain location: Rt shoulder out of whack:  Pain description: unstable  Aggravating factors: overuse , handling dog  Relieving factors: heating pad    L elbow pain comes and goes    PRECAUTIONS: None   RED FLAGS: Monitor syncope         WEIGHT BEARING  RESTRICTIONS: No   FALLS:  Has patient fallen in last 6 months? 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   DIAGNOSTIC FINDINGS:  none   CARDIO/ORTHOSTATICS: Baseline RHR low 80's per patient  Standing HR 94 Baseline BP 86/60 Standing BP 80/60     PATIENT SURVEYS:  FAS     COGNITION: Overall cognitive status: Within functional limits for tasks assessed                          SENSATION: Feet tingling at frequently , some N/T in L elbow from surgery      POSTURE: rounded shoulders, forward head, increased thoracic kyphosis, and posterior pelvic tilt   PALPATION: Sore to palpation Rt shoulder grossly  Skin is extensible and very smooth soft      LUMBAR ROM:    AROM eval  Flexion Palms flat   Extension    Right lateral flexion    Left lateral flexion    Right rotation    Left rotation     (Blank rows = not tested)   LOWER EXTREMITY ROM:  PROM WNL, hypermobile IR >ER     LOWER EXTREMITY MMT:     MMT Right eval Left eval  Hip flexion 4+ 4+  Hip extension      Hip abduction      Hip adduction      Hip internal rotation      Hip external rotation      Knee flexion 5 5  Knee extension 5 5  Ankle dorsiflexion      Ankle plantarflexion      Ankle inversion      Ankle eversion       (Blank rows = not tested)   LUMBAR SPECIAL TESTS:  NT     CERVICAL ROM:  NT on eval   Active ROM A/PROM (deg) 01/01/2024  Flexion    Extension    Right lateral flexion    Left lateral flexion    Right rotation    Left rotation     (Blank rows = not tested)   UE ROM: WNL  Pain limits Rt shoulder combo ER/ and abduction   UE MMT:   MMT Right eval Left eval  Shoulder flexion 4+ 4+  Shoulder extension      Shoulder abduction 4+   4+  Shoulder adduction      Shoulder extension      Shoulder internal rotation 4 pain  4+  Shoulder external rotation 4 pain  4+  Middle trapezius      Lower trapezius  Elbow flexion 4 4+  Elbow extension 4 4+  Wrist flexion      Wrist extension      Wrist ulnar deviation      Wrist radial deviation      Wrist pronation      Wrist supination      Grip strength       (Blank rows = not tested)   CERVICAL SPECIAL TESTS:  NT    FUNCTIONAL TESTS:  2 MWT: TBA     8/9: knee hyper extension  5 deg on Rt and 10 on Lt    TREATMENT:   [facility] Adult PT Treatment:                                                DATE: 04/04/24 HEP reviewed Cont POC  ER green band Horizontal abduction green band Cat and camel to child pose  Thread the needle Rotation T spine  Open book     PATIENT EDUCATION:  Education details:  Posture, alignment, neutral zone and joint protection PNE/Explain pain             Joint inflammation/collagen/ligament laxity, muscular support Stretching vs stabilizing   Person educated: Patient Education method: Explanation Education comprehension: verbalized understanding     HOME EXERCISE PROGRAM: Access Code: [email or web address] URL: [email or web address] Date: 04/04/2024 Prepared by: Delon Norma  Exercises - Supine Posterior Pelvic Tilt  - 1 x daily - 7 x weekly - 2 sets - 10 reps - 5 sec hold - Supine Bridge  - 1 x daily - 7 x weekly - 2 sets - 10 reps - Clamshell with Resistance  - 1 x daily - 7 x weekly - 2 sets - 15 reps - red band hold - Supine Shoulder Horizontal Abduction with  Resistance  - 1 x daily - 7 x weekly - 2-3 sets - 15 reps - green band hold - Sidelying Hip Abduction  - 1 x daily - 7 x weekly - 3 sets - 15 reps - Shoulder External Rotation and Scapular Retraction with Resistance  - 1 x daily - 7 x weekly - 3 sets - 10 reps - green band (supine) hold - Supine 90/90 Abdominal Bracing  - 1 x daily - 7 x weekly - 2-3 reps - 15 sec hold - Sidelying Thoracic Rotation with Open Book  - 1 x daily - 7 x weekly - 2 sets - 10 reps - 10 hold - Quadruped Thoracic Rotation Full Range with Hand on Neck  - 1 x daily - 7 x weekly - 2 sets - 10 reps - 5 hold   ASSESSMENT:  CLINICAL IMPRESSION: Patient was reevaluated today and has been several months since she was last seen for physical therapy.  I was the one who evaluated her in August.  Since then she has had a flareup and it put her back significantly but she is overall feeling better.  She has a lot of different tools in order to help her monitor her heart rate her blood pressure and her symptoms.  She wears something called a visible which gives her pace points allowing her to account for different activities and manage her energy expenditure and fatigue.  She reports having a recumbent bike at home as well.  She has been able to continue to walk her dogs but would like to  be able to do so more consistently.  We will see her 1 time a week for 8 weeks to see if we can reduce symptoms build some resilience and improve overall functional mobility.   OBJECTIVE IMPAIRMENTS: cardiopulmonary status limiting activity, decreased activity tolerance, decreased coordination, decreased endurance, decreased mobility, difficulty walking, decreased ROM, decreased strength, impaired UE functional use, postural dysfunction, obesity, and pain.    ACTIVITY LIMITATIONS: carrying, lifting, bending, sitting, standing, squatting, sleeping, stairs, transfers, reach over head, locomotion level, and caring for others   PARTICIPATION LIMITATIONS: meal  prep, cleaning, laundry, interpersonal relationship, shopping, and community activity   PERSONAL FACTORS: Past/current experiences, Time since onset of injury/illness/exacerbation, and 3+ comorbidities: dysautonomia/syncope, MCAS, L elbow post surgery  are also affecting patient's functional outcome.    REHAB POTENTIAL: Excellent   CLINICAL DECISION MAKING: Evolving/moderate complexity   EVALUATION COMPLEXITY: Moderate     GOALS: Goals reviewed with patient? Yes   SHORT TERM GOALS: Target date: 11/01/2023     Patient will be able to show independence for initial HEP to include posture, core and hip strength and stability.   Baseline: Goal status: MET   2.    Patient will be able to complete functional testing for endurance, balance and strength. Baseline:  Goal status:MET   3.  Pt will report improved awareness of core and posture when performing ADLs  Baseline:  Goal status: IN PROGRESS     LONG TERM GOALS: Target date: 02/12/2024   Patient will be independent with final HEP upon discharge from PT and report consistent benefit following exercise completion.    Baseline:  Goal status: INITIAL   2.  Pt will improve 2 min walk test distance by 50 feet or more with LRAD and no increase in pain.   Baseline: 355 11/16/23: 335ft Goal status: IN PROGRESS   3.  Patient will be I with concepts of joint protection and stability as it pertains to joint hypermobility. Baseline:  Goal status: INITIAL   4.  Pt will be able to demonstrate good standing balance as evidenced by balance recovery from min to mod dynamic balance challenges.   Baseline:  Goal status: INITIAL   5.  Pt will be able to consistently walk dogs 1/2 mile without undue fatigue and pain < 2/10, 20 to 25 minutes Baseline:  Goal status: INITIAL   6.  Patient will be able to rely less on seated rest breaks both in standing and in the shower due to improved activity tolerance.    Baseline: Has a stool in her  kitchen and has a shower chair.  Goal status NEW  PLAN:   PT FREQUENCY: 1x/week   PT DURATION: 8 weeks   PLANNED INTERVENTIONS: 97164- PT Re-evaluation, 97750- Physical Performance Testing, 97110-Therapeutic exercises, 97530- Therapeutic activity, [id number]- Neuromuscular re-education, 97535- Self Care, 02859- Manual therapy, 20560 (1-2 muscles), 20561 (3+ muscles)- Dry Needling, Patient/Family education, Balance training, Taping, Cryotherapy, and Moist heat.   PLAN FOR NEXT SESSION: establish HEP and complete 2 min walk, check BP.HR . Focus on stability training and core control   Delon Norma, PT 04/04/24 8:12 PM Phone: [phone] Fax: [phone]    "

## 2024-04-07 ENCOUNTER — Encounter (HOSPITAL_BASED_OUTPATIENT_CLINIC_OR_DEPARTMENT_OTHER): Payer: Self-pay

## 2024-04-07 ENCOUNTER — Emergency Department (HOSPITAL_BASED_OUTPATIENT_CLINIC_OR_DEPARTMENT_OTHER)
Admission: EM | Admit: 2024-04-07 | Discharge: 2024-04-07 | Disposition: A | Discharge status: Home / Self Care | Attending: Emergency Medicine | Admitting: Emergency Medicine

## 2024-04-07 ENCOUNTER — Other Ambulatory Visit: Payer: Self-pay

## 2024-04-07 DIAGNOSIS — Z91010 Allergy to peanuts: Secondary | ICD-10-CM | POA: Insufficient documentation

## 2024-04-07 DIAGNOSIS — E119 Type 2 diabetes mellitus without complications: Secondary | ICD-10-CM | POA: Diagnosis not present

## 2024-04-07 DIAGNOSIS — G43909 Migraine, unspecified, not intractable, without status migrainosus: Secondary | ICD-10-CM | POA: Diagnosis present

## 2024-04-07 DIAGNOSIS — R002 Palpitations: Secondary | ICD-10-CM | POA: Diagnosis not present

## 2024-04-07 DIAGNOSIS — J45909 Unspecified asthma, uncomplicated: Secondary | ICD-10-CM | POA: Insufficient documentation

## 2024-04-07 LAB — CBC WITH DIFFERENTIAL/PLATELET
Abs Immature Granulocytes: 0.02 K/uL (ref 0.00–0.07)
Basophils Absolute: 0.1 K/uL (ref 0.0–0.1)
Basophils Relative: 1 %
Eosinophils Absolute: 0.1 K/uL (ref 0.0–0.5)
Eosinophils Relative: 1 %
HCT: 37.3 % (ref 36.0–46.0)
Hemoglobin: 12.6 g/dL (ref 12.0–15.0)
Immature Granulocytes: 0 %
Lymphocytes Relative: 26 %
Lymphs Abs: 2.4 K/uL (ref 0.7–4.0)
MCH: 30.1 pg (ref 26.0–34.0)
MCHC: 33.8 g/dL (ref 30.0–36.0)
MCV: 89.2 fL (ref 80.0–100.0)
Monocytes Absolute: 0.6 K/uL (ref 0.1–1.0)
Monocytes Relative: 6 %
Neutro Abs: 6 K/uL (ref 1.7–7.7)
Neutrophils Relative %: 66 %
Platelets: 368 K/uL (ref 150–400)
RBC: 4.18 MIL/uL (ref 3.87–5.11)
RDW: 12.3 % (ref 11.5–15.5)
WBC: 9.2 K/uL (ref 4.0–10.5)
nRBC: 0 % (ref 0.0–0.2)

## 2024-04-07 LAB — BASIC METABOLIC PANEL WITH GFR
Anion gap: 10 (ref 5–15)
BUN: 22 mg/dL — ABNORMAL HIGH (ref 6–20)
CO2: 22 mmol/L (ref 22–32)
Calcium: 9.3 mg/dL (ref 8.9–10.3)
Chloride: 108 mmol/L (ref 98–111)
Creatinine, Ser: 1.14 mg/dL — ABNORMAL HIGH (ref 0.44–1.00)
GFR, Estimated: >60 mL/min
Glucose, Bld: 84 mg/dL (ref 70–99)
Potassium: 4 mmol/L (ref 3.5–5.1)
Sodium: 140 mmol/L (ref 135–145)

## 2024-04-07 LAB — HCG, SERUM, QUALITATIVE: Preg, Serum: NEGATIVE

## 2024-04-07 MED ORDER — DIPHENHYDRAMINE HCL 50 MG/ML IJ SOLN
25.0000 mg | Freq: Once | INTRAMUSCULAR | Status: AC
  Administered 2024-04-07: 25 mg via INTRAVENOUS
  Filled 2024-04-07: qty 1

## 2024-04-07 MED ORDER — SODIUM CHLORIDE 0.9 % IV BOLUS
1000.0000 mL | Freq: Once | INTRAVENOUS | Status: AC
  Administered 2024-04-07: 1000 mL via INTRAVENOUS

## 2024-04-07 MED ORDER — METOCLOPRAMIDE HCL 5 MG/ML IJ SOLN
10.0000 mg | Freq: Once | INTRAMUSCULAR | Status: AC
  Administered 2024-04-07: 10 mg via INTRAVENOUS
  Filled 2024-04-07: qty 2

## 2024-04-07 MED ORDER — HYDROMORPHONE HCL 1 MG/ML IJ SOLN
1.0000 mg | Freq: Once | INTRAMUSCULAR | Status: AC
  Administered 2024-04-07: 1 mg via INTRAVENOUS
  Filled 2024-04-07: qty 1

## 2024-04-07 MED ORDER — DEXAMETHASONE SOD PHOSPHATE PF 10 MG/ML IJ SOLN
10.0000 mg | Freq: Once | INTRAMUSCULAR | Status: AC
  Administered 2024-04-07: 10 mg via INTRAVENOUS
  Filled 2024-04-07: qty 1

## 2024-04-07 NOTE — ED Triage Notes (Signed)
 Pt arrives with c/o palpitations that started early this morning. Pt reports CP, headache and n/v. Pt denies SOB.

## 2024-04-07 NOTE — ED Provider Notes (Signed)
 " [facility] Provider Note   CSN: [id number] Arrival date & time: 04/07/24  1639     Patient presents with: Palpitations   Amber [name] is a 44 y.o. female.   Patient is a 44 year old female with a history of migraines and POTS.  She says that she started having a migraine last night.  It is left-sided.  She has associated nausea and vomiting.  It feels similar to her prior migrainous type headaches.  She has taken her home medications without improvement in symptoms.  She said that she is been seen frequently for similar headaches and typically she gets Decadron , Reglan , Benadryl , IV fluids and Dilaudid  for her migraine treatment in the ED.  She denies any fevers.  No neck pain.  No numbness or weakness to her extremities.  No vision changes or speech deficits.  She has been having some palpitations as well which is typical for her with her POTS but thinks it is a little bit more pronounced recently.  She said her blood pressure has been a little bit lower than it typically is.       Prior to Admission medications  Medication Sig Start Date End Date Taking? Authorizing Provider  busPIRone  (BUSPAR ) 7.5 MG tablet Take 1 tab by mouth 3 times daily as needed for anxiety. 01/23/24   Frann Mabel Mt, DO  cabergoline  (DOSTINEX ) 0.5 MG tablet 2.5 mg weekly (Two tabs on Mondays, 1 tab on wednesdays and 2 tabs on Fridays) 01/09/23   Shamleffer, Ibtehal Jaralla, MD  cetirizine (ZYRTEC) 10 MG chewable tablet Chew 10 mg by mouth in the morning and at bedtime.    [provider]  Cholecalciferol (TRUE VITAMIN D3) 1.25 MG (50000 UT) capsule Take 1 capsule (50,000 Units total) by mouth daily. 10/11/23   Frann Mabel Mt, DO  Continuous Glucose Sensor (DEXCOM G7 SENSOR) MISC USE 1 SENSOR TOPICALLY EVERY 10 DAYS 12/25/23   Frann Mabel Mt, DO  cromolyn  (GASTROCROM ) 100 MG/5ML solution Take 5 mLs (100 mg total) by mouth 4 (four) times  daily -  before meals and at bedtime. 10/10/23   Lorin Norris, MD  cyanocobalamin  (VITAMIN B12) 1000 MCG/ML injection Inject 1 mL (1,000 mcg total) into the muscle every 14 (fourteen) days. 03/04/24   Tonette Lauraine HERO, PA-C  dexlansoprazole  (DEXILANT ) 60 MG capsule Take 1 capsule (60 mg total) by mouth daily. 07/24/23   Cirigliano, Vito V, DO  EPINEPHrine  (EPIPEN  2-PAK) 0.3 mg/0.3 mL IJ SOAJ injection Inject 0.3 mg into the muscle as needed for anaphylaxis. 06/14/23   Frann Mabel Mt, DO  Erenumab -aooe (AIMOVIG ) 140 MG/ML SOAJ Inject 140 mg as directed subcutaneously every 4 weeks 03/05/24   Skeet Juliene SAUNDERS, DO  escitalopram  (LEXAPRO ) 20 MG tablet Take 1 tablet (20 mg total) by mouth daily. 01/23/24   Frann Mabel Mt, DO  famotidine  (PEPCID ) 20 MG tablet Take 20 mg by mouth 2 (two) times daily.    [provider]  fenofibrate  (TRICOR ) 145 MG tablet Take 1 tablet (145 mg total) by mouth daily. 03/04/24   Frann Mabel Mt, DO  folic acid  (FOLVITE ) 1 MG tablet Take 1 tablet (1 mg total) by mouth daily. 05/23/23   Tonette Lauraine HERO, PA-C  glucose blood (ACCU-CHEK GUIDE TEST) test strip Use to Check Blood [place] 09/06/23   Frann Mabel Mt, DO  ipratropium (ATROVENT ) 0.06 % nasal spray Place 2 sprays into both nostrils 4 (four) times daily. 09/29/23   Lorin Norris, MD  Menaquinone-7 (VITAMIN K2) 100 MCG CAPS Take 1 Dose by mouth daily.    [provider]  midodrine  (PROAMATINE ) 2.5 MG tablet Take 1 tablet (2.5 mg total) by mouth 3 (three) times daily with meals. 03/06/24   Joane Artist RAMAN, MD  minoxidil  (LONITEN ) 2.5 MG tablet Take 2.5 mg by mouth daily. 03/06/24   [provider]  omalizumab  (XOLAIR ) 150 MG/ML prefilled syringe Inject 300 mg into the skin every 14 (fourteen) days. 04/02/24   Lorin Norris, MD  ondansetron  (ZOFRAN -ODT) 8 MG disintegrating tablet Take 1 tablet (8 mg total) by mouth every 8 (eight) hours as needed for nausea or  vomiting. 09/06/23   Skeet Juliene SAUNDERS, DO  QUEtiapine  (SEROQUEL ) 300 MG tablet Take 1 tablet (300 mg total) by mouth at bedtime. 03/04/24   Frann Mabel Mt, DO  Riboflavin 400 MG CAPS Take 400 mg by mouth daily.    [provider]  Rimegepant Sulfate (NURTEC) 75 MG TBDP Take 1 tablet (75 mg total) by mouth as needed (Daily as needed for a Migraine). Maximum 1 tablet in 24 hours.  Quantity 8. 03/05/24   Skeet Juliene SAUNDERS, DO  rosuvastatin  (CRESTOR ) 20 MG tablet Take 1 tablet (20 mg total) by mouth daily. 03/04/24   Frann Mabel Mt, DO  Syringe/Needle, Disp, 25G X 1 1 ML MISC 1 Dose by Does not apply route every 14 (fourteen) days. 08/31/23   Tonette Lauraine HERO, PA-C  tirzepatide  (MOUNJARO ) 15 MG/0.5ML Pen Inject 15 mg into the skin once a week. 03/04/24   Frann Mabel Mt, DO  topiramate  (TOPAMAX ) 100 MG tablet Take 1 tablet (100 mg total) by mouth at bedtime. 03/05/24   Skeet, Adam R, DO  traMADol  (ULTRAM ) 50 MG tablet TAKE ONE TABLET BY MOUTH EVERY 6 HOURS AS NEEDED 02/26/24   Skeet Juliene SAUNDERS, DO    Allergies: Bee venom, Chlorhexidine, Ibuprofen, Iodine, Peanut-containing drug products, Sesame seed (diagnostic), Shellfish allergy , Soybean-containing drug products, Sulfa antibiotics, Triptans, Lunesta  [eszopiclone ], Almond (diagnostic), Anacardium occidentale, Aspirin, Hazelnut (filbert), Nsaids, Tape, Walnut, and Wheat    Review of Systems  Constitutional:  Negative for chills, diaphoresis, fatigue and fever.  HENT:  Negative for congestion, rhinorrhea and sneezing.   Eyes: Negative.   Respiratory:  Negative for cough, chest tightness and shortness of breath.   Cardiovascular:  Negative for chest pain and leg swelling.  Gastrointestinal:  Positive for nausea and vomiting. Negative for abdominal pain.  Genitourinary:  Negative for difficulty urinating, flank pain and frequency.  Musculoskeletal:  Negative for arthralgias, back pain and neck stiffness.  Skin:  Negative for  rash.  Neurological:  Positive for headaches. Negative for dizziness, speech difficulty, weakness and numbness.    Updated Vital Signs BP 96/76 (BP Location: Right Arm)   Pulse 94   Temp 98.2 F (36.8 C)   Resp 14   Wt 83.5 kg   LMP 02/23/2024   SpO2 100%   BMI 29.70 kg/m   Physical Exam Constitutional:      Appearance: She is well-developed.  HENT:     Head: Normocephalic and atraumatic.  Eyes:     Pupils: Pupils are equal, round, and reactive to light.  Cardiovascular:     Rate and Rhythm: Normal rate and regular rhythm.     Heart sounds: Normal heart sounds.  Pulmonary:     Effort: Pulmonary effort is normal. No respiratory distress.     Breath sounds: Normal breath sounds. No wheezing or rales.  Chest:  Chest wall: No tenderness.  Abdominal:     General: Bowel sounds are normal.     Palpations: Abdomen is soft.     Tenderness: There is no abdominal tenderness. There is no guarding or rebound.  Musculoskeletal:        General: Normal range of motion.     Cervical back: Normal range of motion and neck supple.  Lymphadenopathy:     Cervical: No cervical adenopathy.  Skin:    General: Skin is warm and dry.     Findings: No rash.  Neurological:     Mental Status: She is alert and oriented to person, place, and time.     Comments: Motor 5/5 all extremities Sensation grossly intact to LT all extremities Finger to Nose intact, no pronator drift CN II-XII grossly intact       (all labs ordered are listed, but only abnormal results are displayed) Labs Reviewed  BASIC METABOLIC PANEL WITH GFR - Abnormal; Notable for the following components:      Result Value   BUN 22 (*)    Creatinine, Ser 1.14 (*)    All other components within normal limits  CBC WITH DIFFERENTIAL/PLATELET  HCG, SERUM, QUALITATIVE    EKG: EKG Interpretation Date/Time:  Sunday April 07 2024 16:54:22 EST Ventricular Rate:  82 PR Interval:  177 QRS Duration:  81 QT  Interval:  373 QTC Calculation: 436 R Axis:   67  Text Interpretation: Sinus rhythm since last tracing no significant change Confirmed by Lenor Hollering [phone]) on 04/07/2024 5:00:33 PM  Radiology: No results found.   Procedures   Medications Ordered in the ED  metoCLOPramide  (REGLAN ) injection 10 mg (10 mg Intravenous Given 04/07/24 1742)  diphenhydrAMINE  (BENADRYL ) injection 25 mg (25 mg Intravenous Given 04/07/24 1741)  dexamethasone  (DECADRON ) injection 10 mg (10 mg Intravenous Given 04/07/24 1745)  sodium chloride  0.9 % bolus 1,000 mL ( Intravenous Stopped 04/07/24 1842)  HYDROmorphone  (DILAUDID ) injection 1 mg (1 mg Intravenous Given 04/07/24 1748)                                    Medical Decision Making Amount and/or Complexity of Data Reviewed Labs: ordered.  Risk Prescription drug management.   This patient presents to the ED for concern of migraine, this involves an extensive number of treatment options, and is a complaint that carries with it a high risk of complications and morbidity.  I considered the following differential and admission for this acute, potentially life threatening condition.  The differential diagnosis includes migraine, meningitis, intracranial hemorrhage, stroke, dehydration  MDM:    Patient is a 44 year old female who has a history of migraines.  She presents with a migrainous type headache.  She also has some palpitations.  Her EKG does not show any arrhythmias.  Electrolytes are nonconcerning.  Her creatinine is mildly elevated but similar to prior values.  Pregnancy test is negative.  Her headache is consistent with her prior migrainous type headaches.  She does not have other symptoms that would be more concerning for subarachnoid hemorrhage or meningitis.  No focal neurologic deficit or concern for stroke.  She was given migraine cocktail with added Dilaudid  which is per her report her typical regimen and per chart review was verified.  Her  headache is resolved.  She is ready to go home.  She was discharged home in good condition.  Return precautions were given.  [employer],  imaging, consults)  Labs: I Ordered, and personally interpreted labs.  The pertinent results include: Normal white count, no anemia, electrolytes nonconcerning  Imaging Studies ordered: I ordered imaging studies including   I independently visualized and interpreted imaging. I agree with the radiologist interpretation  Additional history obtained from  .  External records from outside source obtained and reviewed including prior notes  Cardiac Monitoring: The patient was maintained on a cardiac monitor.  If on the cardiac monitor, I personally viewed and interpreted the cardiac monitored which showed an underlying rhythm of: Sinus rhythm  Reevaluation: After the interventions noted above, I reevaluated the patient and found that they have :improved  Social Determinants of Health:    Disposition: Discharged to home  Co morbidities that complicate the patient evaluation  Past Medical History:  Diagnosis Date   Anxiety    Asthma    Depression    Diabetes (HCC)    GERD (gastroesophageal reflux disease)    History of chicken pox    History of migraine headaches    Migraines    Pituitary adenoma (HCC)    POTS (postural orthostatic tachycardia syndrome)    Seizures (HCC)      Medicines Meds ordered this encounter  Medications   metoCLOPramide  (REGLAN ) injection 10 mg   diphenhydrAMINE  (BENADRYL ) injection 25 mg   dexamethasone  (DECADRON ) injection 10 mg   sodium chloride  0.9 % bolus 1,000 mL   HYDROmorphone  (DILAUDID ) injection 1 mg    I have reviewed the patients home medicines and have made adjustments as needed  Problem List / ED Course: Problem List Items Addressed This Visit       Cardiovascular and Mediastinum   Migraine - Primary             Final diagnoses:  Migraine without status migrainosus, not intractable,  unspecified migraine type    ED Discharge Orders     None          Lenor Hollering, MD 04/07/24 1912  "

## 2024-04-10 ENCOUNTER — Inpatient Hospital Stay

## 2024-04-10 ENCOUNTER — Inpatient Hospital Stay: Attending: Medical Oncology

## 2024-04-10 ENCOUNTER — Encounter: Payer: Self-pay | Admitting: Family

## 2024-04-10 ENCOUNTER — Inpatient Hospital Stay: Admitting: Family

## 2024-04-10 VITALS — BP 95/61 | HR 79 | Temp 98.3°F | Resp 20 | Ht 66.0 in | Wt 190.0 lb

## 2024-04-10 DIAGNOSIS — R7989 Other specified abnormal findings of blood chemistry: Secondary | ICD-10-CM

## 2024-04-10 DIAGNOSIS — E639 Nutritional deficiency, unspecified: Secondary | ICD-10-CM

## 2024-04-10 DIAGNOSIS — D509 Iron deficiency anemia, unspecified: Secondary | ICD-10-CM

## 2024-04-10 DIAGNOSIS — E538 Deficiency of other specified B group vitamins: Secondary | ICD-10-CM

## 2024-04-10 LAB — CBC
HCT: 37.6 % (ref 36.0–46.0)
Hemoglobin: 12.9 g/dL (ref 12.0–15.0)
MCH: 30.5 pg (ref 26.0–34.0)
MCHC: 34.3 g/dL (ref 30.0–36.0)
MCV: 88.9 fL (ref 80.0–100.0)
Platelets: 377 K/uL (ref 150–400)
RBC: 4.23 MIL/uL (ref 3.87–5.11)
RDW: 12.5 % (ref 11.5–15.5)
WBC: 6.4 K/uL (ref 4.0–10.5)
nRBC: 0 % (ref 0.0–0.2)

## 2024-04-10 LAB — IRON AND IRON BINDING CAPACITY (CC-WL,HP ONLY)
Iron: 76 ug/dL (ref 28–170)
Saturation Ratios: 18 % (ref 10.4–31.8)
TIBC: 435 ug/dL (ref 250–450)
UIBC: 359 ug/dL

## 2024-04-10 LAB — VITAMIN B12: Vitamin B-12: 721 pg/mL (ref 180–914)

## 2024-04-10 LAB — FERRITIN: Ferritin: 148 ng/mL (ref 11–307)

## 2024-04-10 LAB — FOLATE: Folate: <3 ng/mL — ABNORMAL LOW

## 2024-04-10 MED ORDER — CYANOCOBALAMIN 1000 MCG/ML IJ SOLN
1000.0000 ug | Freq: Once | INTRAMUSCULAR | Status: AC
  Administered 2024-04-10: 1000 ug via INTRAMUSCULAR
  Filled 2024-04-10: qty 1

## 2024-04-10 NOTE — Progress Notes (Signed)
 " Hematology and Oncology Follow Up Visit  Amber [name] [id number] 1980/11/05 44 y.o. 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 She is feeling fatigued and notes brain fog. She has also had occasional SOB with exertion.  She has history of POTS. Dizziness with near syncope occurred last night. She was able to sit down before falling. Her last fall was 2 months ago.  Numbness and tingling in her lower extremities comes and goes.  No fever, chills, cough, rash, chest pain, abdominal pain or changes in bowel or bladder habits at this time.  Appetite and hydration are good. Weight is stable at 190 lbs.   ECOG Performance Status: 1 - Symptomatic but completely ambulatory  Medications:  Allergies as of 04/10/2024       Reactions   Bee Venom Anaphylaxis   Chlorhexidine Hives, Itching, Rash   Ibuprofen Other (See Comments)   GI bleed   Iodine Hives, Itching, Rash   Peanut-containing Drug Products Other (See Comments)   Pt reports unknown   Sesame Seed (diagnostic) Anaphylaxis   Shellfish Allergy  Shortness Of Breath, Swelling, Other (See Comments)   Tingling in the lips, mouth and throat   Soybean-containing Drug Products Anaphylaxis   Sulfa Antibiotics Anaphylaxis   Triptans Other (See Comments)   Makes pain worse. Pain in joints   Lunesta  [eszopiclone ] Nausea And Vomiting   GI issues   Almond (diagnostic) Other (See Comments)   Unknown per pt   Anacardium Occidentale Other (See Comments)   Unknown per pt   Aspirin Other (See Comments)   UNKNOWN REACTION. Her parents and siblings are allergic to Aspirin.   Hazelnut (filbert) Other (See Comments)   Unknown per pt   Nsaids Nausea And Vomiting    Tape Rash   Blisters   Walnut Other (See Comments)   Unknown per pt   Wheat Nausea And Vomiting        Medication List        Accurate as of April 10, 2024  2:38 PM. If you have any questions, ask your nurse or doctor.          STOP taking these medications    midodrine  2.5 MG tablet Commonly known as: PROAMATINE  Stopped by: Lauraine Pepper, NP       TAKE these medications    Accu-Chek Guide Test test strip Generic drug: glucose blood Use to Check Blood Suagr   Aimovig  140 MG/ML Soaj Generic drug: Erenumab -aooe Inject 140 mg as directed subcutaneously every 4 weeks   busPIRone  7.5 MG tablet Commonly known as: BUSPAR  Take 1 tab by mouth 3 times daily as needed for anxiety.   cabergoline  0.5 MG tablet Commonly known as: DOSTINEX  2.5 mg weekly (Two tabs on Mondays, 1 tab on wednesdays and 2 tabs on Fridays)   cetirizine 10 MG chewable tablet Commonly known as: ZYRTEC Chew 10 mg by mouth in the morning and at bedtime.   cromolyn  100 MG/5ML solution Commonly known as: GASTROCROM  Take 5 mLs (100 mg total) by mouth 4 (four) times daily -  before meals and at bedtime.   cyanocobalamin  1000 MCG/ML injection Commonly known as: VITAMIN B12 Inject 1 mL (1,000 mcg total) into  the muscle every 14 (fourteen) days.   Dexcom G7 Sensor Misc USE 1 SENSOR TOPICALLY EVERY 10 DAYS   dexlansoprazole  60 MG capsule Commonly known as: Dexilant  Take 1 capsule (60 mg total) by mouth daily.   EPINEPHrine  0.3 mg/0.3 mL Soaj injection Commonly known as: EpiPen  2-Pak Inject 0.3 mg into the muscle as needed for anaphylaxis.   escitalopram  20 MG tablet Commonly known as: Lexapro  Take 1 tablet (20 mg total) by mouth daily.   famotidine  20 MG tablet Commonly known as: PEPCID  Take 20 mg by mouth 2 (two) times daily.   fenofibrate  145 MG tablet Commonly known as: TRICOR  Take 1 tablet (145 mg total) by mouth daily.   folic acid  1 MG tablet Commonly known as: FOLVITE  Take  1 tablet (1 mg total) by mouth daily.   ipratropium 0.06 % nasal spray Commonly known as: ATROVENT  Place 2 sprays into both nostrils 4 (four) times daily.   minoxidil  2.5 MG tablet Commonly known as: LONITEN  Take 2.5 mg by mouth daily.   Nurtec 75 MG Tbdp Generic drug: Rimegepant Sulfate Take 1 tablet (75 mg total) by mouth as needed (Daily as needed for a Migraine). Maximum 1 tablet in 24 hours.  Quantity 8.   omalizumab  150 MG/ML prefilled syringe Commonly known as: Xolair  Inject 300 mg into the skin every 14 (fourteen) days.   ondansetron  8 MG disintegrating tablet Commonly known as: ZOFRAN -ODT Take 1 tablet (8 mg total) by mouth every 8 (eight) hours as needed for nausea or vomiting.   QUEtiapine  300 MG tablet Commonly known as: SEROQUEL  Take 1 tablet (300 mg total) by mouth at bedtime.   Riboflavin 400 MG Caps Take 400 mg by mouth daily.   rosuvastatin  20 MG tablet Commonly known as: Crestor  Take 1 tablet (20 mg total) by mouth daily.   Syringe/Needle (Disp) 25G X 1 1 ML Misc 1 Dose by Does not apply route every 14 (fourteen) days.   tirzepatide  15 MG/0.5ML Pen Commonly known as: MOUNJARO  Inject 15 mg into the skin once a week.   topiramate  100 MG tablet Commonly known as: TOPAMAX  Take 1 tablet (100 mg total) by mouth at bedtime.   traMADol  50 MG tablet Commonly known as: ULTRAM  TAKE ONE TABLET BY MOUTH EVERY 6 HOURS AS NEEDED   True Vitamin D3 1.25 MG (50000 UT) capsule Generic drug: Cholecalciferol Take 1 capsule (50,000 Units total) by mouth daily.   Vitamin K2 100 MCG Caps Take 1 Dose by mouth daily.        Allergies: Allergies[1]  Past Medical History, Surgical history, Social history, and Family History were reviewed and updated.  Review of Systems: All other 10 point review of systems is negative.   Physical Exam:  height is 5' 6 (1.676 m) and weight is 190 lb (86.2 kg). Her oral temperature is 98.3 F (36.8 C). Her blood pressure is  95/61 and her pulse is 79. Her respiration is 20 and oxygen saturation is 99%.   Wt Readings from Last 3 Encounters:  04/10/24 190 lb (86.2 kg)  04/07/24 184 lb (83.5 kg)  03/08/24 184 lb (83.5 kg)    Ocular: Sclerae unicteric, pupils equal, round and reactive to light Ear-nose-throat: Oropharynx clear, dentition fair Lymphatic: No cervical or supraclavicular adenopathy Lungs no rales or rhonchi, good excursion bilaterally Heart regular rate and rhythm, no murmur appreciated Abd soft, nontender, positive bowel sounds MSK no focal spinal tenderness, no joint edema Neuro: non-focal, well-oriented, appropriate affect Breasts: Deferred   Lab  Results  Component Value Date   WBC 6.4 04/10/2024   HGB 12.9 04/10/2024   HCT 37.6 04/10/2024   MCV 88.9 04/10/2024   PLT 377 04/10/2024   Lab Results  Component Value Date   FERRITIN 92 12/21/2023   IRON  95 12/21/2023   TIBC 434 12/21/2023   UIBC 339 12/21/2023   IRONPCTSAT 22 12/21/2023   Lab Results  Component Value Date   RETICCTPCT 1.6 12/21/2023   RBC 4.23 04/10/2024   No results found for: JONATHAN BONG [facility] Lab Results  Component Value Date   IGGSERUM 971 10/25/2023   IGMSERUM 140 10/25/2023   No results found for: STEPHANY CARLOTA BENSON MARKEL EARLA JOANNIE DOC, MSPIKE, SPEI   Chemistry      Component Value Date/Time   NA 140 04/07/2024 1701   K 4.0 04/07/2024 1701   CL 108 04/07/2024 1701   CO2 22 04/07/2024 1701   BUN 22 (H) 04/07/2024 1701   CREATININE 1.14 (H) 04/07/2024 1701   CREATININE 1.21 (H) 02/23/2024 1446      Component Value Date/Time   CALCIUM  9.3 04/07/2024 1701   ALKPHOS 51 02/23/2024 1524   AST 14 (L) 02/23/2024 1524   AST 27 06/26/2023 1415   ALT 5 02/23/2024 1524   ALT 31 06/26/2023 1415   BILITOT 0.4 02/23/2024 1524   BILITOT 0.5 06/26/2023 1415       Impression and Plan: Patient is a 44 yo female with multifactorial anemia. 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 She also has a history of nutritional deficiencies including B12 and folate.   She is taking once daily folic acid  1 G along with B12 injections given every other week alternating here and at home administration.  Iron  studies pending. We will replace if needed. She does required pre medication.  Follow-up in 4 months.   Lauraine Pepper, NP 1/21/20262:38 PM     [1]  Allergies Allergen Reactions   Bee Venom Anaphylaxis   Chlorhexidine Hives, Itching and Rash   Ibuprofen Other (See Comments)    GI bleed   Iodine Hives, Itching and Rash   Peanut-Containing Drug Products Other (See Comments)    Pt reports unknown   Sesame Seed (Diagnostic) Anaphylaxis   Shellfish Allergy  Shortness Of Breath, Swelling and Other (See Comments)    Tingling in the lips, mouth and throat   Soybean-Containing Drug Products Anaphylaxis   Sulfa Antibiotics Anaphylaxis   Triptans Other (See Comments)    Makes pain worse. Pain in joints   Lunesta  [Eszopiclone ] Nausea And Vomiting    GI issues   Almond (Diagnostic) Other (See Comments)    Unknown per pt   Anacardium Occidentale Other (See Comments)    Unknown per pt   Aspirin Other (See Comments)    UNKNOWN REACTION. Her parents and siblings are allergic to Aspirin.   Hazelnut [facility]) Other (See Comments)    Unknown per pt   Nsaids Nausea And Vomiting   Tape Rash    Blisters    Walnut Other (See Comments)    Unknown per pt   Wheat Nausea And Vomiting   "

## 2024-04-10 NOTE — Patient Instructions (Signed)
 Vitamin B12 Deficiency Vitamin B12 deficiency means that your body does not have enough vitamin B12. The body needs this important vitamin: To make red blood cells. To make genes (DNA). To help the nerves work. If you do not have enough vitamin B12 in your body, you can have health problems, such as not having enough red blood cells in the blood (anemia). What are the causes? Not eating enough foods that contain vitamin B12. Not being able to take in (absorb) vitamin B12 from the food that you eat. Certain diseases. A condition in which the body does not make enough of a certain protein. This results in your body not taking in enough vitamin B12. Having a surgery in which part of the stomach or small intestine is taken out. Taking medicines that make it hard for the body to take in vitamin B12. These include: Heartburn medicines. Some medicines that are used to treat diabetes. What increases the risk? Being an older adult. Eating a vegetarian or vegan diet that does not include any foods that come from animals. Not eating enough foods that contain vitamin B12 while you are pregnant. Taking certain medicines. Having alcoholism. What are the signs or symptoms? In some cases, there are no symptoms. If the condition leads to too few blood cells or nerve damage, symptoms can occur, such as: Feeling weak or tired. Not being hungry. Losing feeling (numbness) or tingling in your hands and feet. Redness and burning of the tongue. Feeling sad (depressed). Confusion or memory problems. Trouble walking. If anemia is very bad, symptoms can include: Being short of breath. Being dizzy. Having a very fast heartbeat. How is this treated? Changing the way you eat and drink, such as: Eating more foods that contain vitamin B12. Drinking little or no alcohol. Getting vitamin B12 shots. Taking vitamin B12 supplements by mouth (orally). Your doctor will tell you the dose that is best for you. Follow  these instructions at home: Eating and drinking  Eat foods that come from animals and have a lot of vitamin B12 in them. These include: Meats and poultry. This includes beef, pork, chicken, [place], and organ meats, such as liver. Seafood, such as clams, rainbow trout, salmon, tuna, and haddock. Eggs. Dairy foods such as milk, yogurt, and cheese. Eat breakfast cereals that have vitamin B12 added to them (are fortified). Check the label. The items listed above may not be a complete list of foods and beverages you can eat and drink. Contact a dietitian for more information. Alcohol use Do not drink alcohol if: Your doctor tells you not to drink. You are pregnant, may be pregnant, or are planning to become pregnant. If you drink alcohol: Limit how much you have to: 0-1 drink a day for women. 0-2 drinks a day for men. Know how much alcohol is in your drink. In the [place] one drink equals one 12 oz bottle of beer (355 mL), one 5 oz glass of wine (148 mL), or one 1 oz glass of hard liquor (44 mL). General instructions Get any vitamin B12 shots if told by your doctor. Take supplements only as told by your doctor. Follow the directions. Keep all follow-up visits. Contact a doctor if: Your symptoms come back. Your symptoms get worse or do not get better with treatment. Get help right away if: You have trouble breathing. You have a very fast heartbeat. You have chest pain. You get dizzy. You faint. These symptoms may be an emergency. Get help right away. Call 911.  Do not wait to see if the symptoms will go away. Do not drive yourself to the hospital. Summary Vitamin B12 deficiency means that your body is not getting enough of the vitamin. In some cases, there are no symptoms of this condition. Treatment may include making a change in the way you eat and drink, getting shots, or taking supplements. Eat foods that have vitamin B12 in them. This information is not intended to replace advice  given to you by your health care provider. Make sure you discuss any questions you have with your health care provider. Document Revised: 10/30/2020 Document Reviewed: 10/30/2020 Elsevier Patient Education  2024 [employer].

## 2024-04-11 ENCOUNTER — Encounter (HOSPITAL_BASED_OUTPATIENT_CLINIC_OR_DEPARTMENT_OTHER): Payer: Self-pay

## 2024-04-11 ENCOUNTER — Other Ambulatory Visit: Payer: Self-pay

## 2024-04-11 ENCOUNTER — Emergency Department (HOSPITAL_BASED_OUTPATIENT_CLINIC_OR_DEPARTMENT_OTHER)

## 2024-04-11 ENCOUNTER — Emergency Department (HOSPITAL_BASED_OUTPATIENT_CLINIC_OR_DEPARTMENT_OTHER)
Admission: EM | Admit: 2024-04-11 | Discharge: 2024-04-11 | Disposition: A | Discharge status: Home / Self Care | Attending: Emergency Medicine | Admitting: Emergency Medicine

## 2024-04-11 DIAGNOSIS — Z91010 Allergy to peanuts: Secondary | ICD-10-CM | POA: Insufficient documentation

## 2024-04-11 DIAGNOSIS — E119 Type 2 diabetes mellitus without complications: Secondary | ICD-10-CM | POA: Insufficient documentation

## 2024-04-11 DIAGNOSIS — G43811 Other migraine, intractable, with status migrainosus: Secondary | ICD-10-CM | POA: Diagnosis not present

## 2024-04-11 DIAGNOSIS — R519 Headache, unspecified: Secondary | ICD-10-CM | POA: Diagnosis present

## 2024-04-11 LAB — BASIC METABOLIC PANEL WITH GFR
Anion gap: 12 (ref 5–15)
BUN: 16 mg/dL (ref 6–20)
CO2: 23 mmol/L (ref 22–32)
Calcium: 9.8 mg/dL (ref 8.9–10.3)
Chloride: 105 mmol/L (ref 98–111)
Creatinine, Ser: 1.11 mg/dL — ABNORMAL HIGH (ref 0.44–1.00)
GFR, Estimated: >60 mL/min
Glucose, Bld: 88 mg/dL (ref 70–99)
Potassium: 4 mmol/L (ref 3.5–5.1)
Sodium: 140 mmol/L (ref 135–145)

## 2024-04-11 LAB — CBC
HCT: 41.6 % (ref 36.0–46.0)
Hemoglobin: 14.2 g/dL (ref 12.0–15.0)
MCH: 30.3 pg (ref 26.0–34.0)
MCHC: 34.1 g/dL (ref 30.0–36.0)
MCV: 88.9 fL (ref 80.0–100.0)
Platelets: 434 K/uL — ABNORMAL HIGH (ref 150–400)
RBC: 4.68 MIL/uL (ref 3.87–5.11)
RDW: 12.4 % (ref 11.5–15.5)
WBC: 9.7 K/uL (ref 4.0–10.5)
nRBC: 0 % (ref 0.0–0.2)

## 2024-04-11 LAB — TROPONIN T, HIGH SENSITIVITY: Troponin T High Sensitivity: <6 ng/L (ref 0–19)

## 2024-04-11 LAB — HCG, SERUM, QUALITATIVE: Preg, Serum: NEGATIVE

## 2024-04-11 MED ORDER — DIPHENHYDRAMINE HCL 50 MG/ML IJ SOLN
25.0000 mg | Freq: Once | INTRAMUSCULAR | Status: AC
  Administered 2024-04-11: 25 mg via INTRAVENOUS
  Filled 2024-04-11: qty 1

## 2024-04-11 MED ORDER — METOCLOPRAMIDE HCL 5 MG/ML IJ SOLN
10.0000 mg | Freq: Once | INTRAMUSCULAR | Status: AC
  Administered 2024-04-11: 10 mg via INTRAVENOUS
  Filled 2024-04-11: qty 2

## 2024-04-11 MED ORDER — HYDROMORPHONE HCL 1 MG/ML IJ SOLN
1.0000 mg | Freq: Once | INTRAMUSCULAR | Status: AC
  Administered 2024-04-11: 1 mg via INTRAVENOUS
  Filled 2024-04-11: qty 1

## 2024-04-11 MED ORDER — SODIUM CHLORIDE 0.9 % IV BOLUS
1000.0000 mL | Freq: Once | INTRAVENOUS | Status: AC
  Administered 2024-04-11: 1000 mL via INTRAVENOUS

## 2024-04-11 MED ORDER — DEXAMETHASONE SOD PHOSPHATE PF 10 MG/ML IJ SOLN
10.0000 mg | Freq: Once | INTRAMUSCULAR | Status: AC
  Administered 2024-04-11: 10 mg via INTRAVENOUS
  Filled 2024-04-11: qty 1

## 2024-04-11 NOTE — ED Triage Notes (Addendum)
 Reports migraines, chest pain, smelling things that aren't there since this morning Also reports shortness of breath, N/V, lightheadedness

## 2024-04-11 NOTE — ED Provider Notes (Signed)
 " [facility] Provider Note   CSN: [id number] Arrival date & time: 04/11/24  1727     Patient presents with: multiple complaints   Amber [name] is a 44 y.o. female.   Patient is a 44 year old female with a history of POTS, migraines, pituitary adenoma, diabetes, seizures who is presenting today with complaint of a headache.  Patient reports that this migraine started around 2 this afternoon and is not improved with the Nurtec and tramadol  she takes at home for breakthrough headaches.  She got her last dose of Aimovig  on Sunday and takes her topiramate  daily.  The headache today is in the posterior part of her head and the left side of her face which she reports is typical for her headaches but the pain is more severe in the back today than normal.  She has photosensitivity as well as nausea but has not had any vomiting.  She has tried taking Zofran  at home but is not helping.  She also has had poor oral intake today because of the symptoms which is then exacerbating her POTS.  She has had presyncopal symptoms when she is tried standing and walking but denies any syncope.  She will get intermittent chest pain and rapid heartbeat with this.  She has not had fever, cough or congestion.  No visual changes.  No unilateral numbness or weakness in her face or extremities.  She also reports that for the last few days she has been smelling things that are not there that nobody else seems to smell.  However it is different scents and not the same thing.  She has not noticed any seizure-like activity.  The history is provided by the patient and medical records.       Prior to Admission medications  Medication Sig Start Date End Date Taking? Authorizing Provider  busPIRone  (BUSPAR ) 7.5 MG tablet Take 1 tab by mouth 3 times daily as needed for anxiety. 01/23/24   Frann Mabel Mt, DO  cabergoline  (DOSTINEX ) 0.5 MG tablet 2.5 mg weekly (Two tabs on Mondays,  1 tab on wednesdays and 2 tabs on Fridays) 01/09/23   Shamleffer, Ibtehal Jaralla, MD  cetirizine (ZYRTEC) 10 MG chewable tablet Chew 10 mg by mouth in the morning and at bedtime.    [provider]  Cholecalciferol (TRUE VITAMIN D3) 1.25 MG (50000 UT) capsule Take 1 capsule (50,000 Units total) by mouth daily. 10/11/23   Frann Mabel Mt, DO  Continuous Glucose Sensor (DEXCOM G7 SENSOR) MISC USE 1 SENSOR TOPICALLY EVERY 10 DAYS 12/25/23   Frann Mabel Mt, DO  cromolyn  (GASTROCROM ) 100 MG/5ML solution Take 5 mLs (100 mg total) by mouth 4 (four) times daily -  before meals and at bedtime. 10/10/23   Lorin Norris, MD  cyanocobalamin  (VITAMIN B12) 1000 MCG/ML injection Inject 1 mL (1,000 mcg total) into the muscle every 14 (fourteen) days. 03/04/24   Tonette Lauraine HERO, PA-C  dexlansoprazole  (DEXILANT ) 60 MG capsule Take 1 capsule (60 mg total) by mouth daily. 07/24/23   Cirigliano, Vito V, DO  EPINEPHrine  (EPIPEN  2-PAK) 0.3 mg/0.3 mL IJ SOAJ injection Inject 0.3 mg into the muscle as needed for anaphylaxis. 06/14/23   Frann Mabel Mt, DO  Erenumab -aooe (AIMOVIG ) 140 MG/ML SOAJ Inject 140 mg as directed subcutaneously every 4 weeks 03/05/24   Skeet Juliene SAUNDERS, DO  escitalopram  (LEXAPRO ) 20 MG tablet Take 1 tablet (20 mg total) by mouth daily. 01/23/24   Frann Mabel Mt, DO  famotidine  (  PEPCID ) 20 MG tablet Take 20 mg by mouth 2 (two) times daily.    [provider]  fenofibrate  (TRICOR ) 145 MG tablet Take 1 tablet (145 mg total) by mouth daily. 03/04/24   Frann Mabel Mt, DO  folic acid  (FOLVITE ) 1 MG tablet Take 1 tablet (1 mg total) by mouth daily. 05/23/23   Tonette Lauraine HERO, PA-C  glucose blood (ACCU-CHEK GUIDE TEST) test strip Use to Check Blood Suagr 09/06/23   Frann Mabel Mt, DO  ipratropium (ATROVENT ) 0.06 % nasal spray Place 2 sprays into both nostrils 4 (four) times daily. 09/29/23   Lorin Norris, MD  Menaquinone-7 (VITAMIN K2) 100  MCG CAPS Take 1 Dose by mouth daily.    [provider]  minoxidil  (LONITEN ) 2.5 MG tablet Take 2.5 mg by mouth daily. 03/06/24   [provider]  omalizumab  (XOLAIR ) 150 MG/ML prefilled syringe Inject 300 mg into the skin every 14 (fourteen) days. 04/02/24   Lorin Norris, MD  ondansetron  (ZOFRAN -ODT) 8 MG disintegrating tablet Take 1 tablet (8 mg total) by mouth every 8 (eight) hours as needed for nausea or vomiting. 09/06/23   Skeet Juliene SAUNDERS, DO  QUEtiapine  (SEROQUEL ) 300 MG tablet Take 1 tablet (300 mg total) by mouth at bedtime. 03/04/24   Frann Mabel Mt, DO  Riboflavin 400 MG CAPS Take 400 mg by mouth daily.    [provider]  Rimegepant Sulfate (NURTEC) 75 MG TBDP Take 1 tablet (75 mg total) by mouth as needed (Daily as needed for a Migraine). Maximum 1 tablet in 24 hours.  Quantity 8. 03/05/24   Skeet Juliene SAUNDERS, DO  rosuvastatin  (CRESTOR ) 20 MG tablet Take 1 tablet (20 mg total) by mouth daily. 03/04/24   Frann Mabel Mt, DO  Syringe/Needle, Disp, 25G X 1 1 ML MISC 1 Dose by Does not apply route every 14 (fourteen) days. 08/31/23   Tonette Lauraine HERO, PA-C  tirzepatide  (MOUNJARO ) 15 MG/0.5ML Pen Inject 15 mg into the skin once a week. 03/04/24   Frann Mabel Mt, DO  topiramate  (TOPAMAX ) 100 MG tablet Take 1 tablet (100 mg total) by mouth at bedtime. 03/05/24   Skeet, Adam R, DO  traMADol  (ULTRAM ) 50 MG tablet TAKE ONE TABLET BY MOUTH EVERY 6 HOURS AS NEEDED 02/26/24   Skeet Juliene SAUNDERS, DO    Allergies: Bee venom, Chlorhexidine, Ibuprofen, Iodine, Peanut-containing drug products, Sesame seed (diagnostic), Shellfish allergy , Soybean-containing drug products, Sulfa antibiotics, Triptans, Lunesta  [eszopiclone ], Almond (diagnostic), Anacardium occidentale, Aspirin, Hazelnut (filbert), Nsaids, Tape, Walnut, and Wheat    Review of Systems  Updated Vital Signs BP (!) 84/69   Pulse 81   Temp 98.3 F (36.8 C)   Resp 17   LMP 03/25/2024   SpO2 100%    Physical Exam Vitals and nursing note reviewed.  Constitutional:      General: She is not in acute distress.    Appearance: She is well-developed.  HENT:     Head: Normocephalic and atraumatic.  Eyes:     Extraocular Movements: Extraocular movements intact.     Conjunctiva/sclera: Conjunctivae normal.     Pupils: Pupils are equal, round, and reactive to light.     Comments: photosensitivity  Cardiovascular:     Rate and Rhythm: Normal rate and regular rhythm.     Heart sounds: Normal heart sounds. No murmur heard.    No friction rub.  Pulmonary:     Effort: Pulmonary effort is normal.     Breath sounds: Normal breath sounds. No  wheezing or rales.  Chest:     Chest wall: No tenderness.  Abdominal:     General: Bowel sounds are normal. There is no distension.     Palpations: Abdomen is soft.     Tenderness: There is no abdominal tenderness. There is no guarding or rebound.  Musculoskeletal:        General: No tenderness. Normal range of motion.     Cervical back: Normal range of motion and neck supple.     Comments: No edema  Skin:    General: Skin is warm and dry.     Findings: No rash.  Neurological:     Mental Status: She is alert and oriented to person, place, and time. Mental status is at baseline.     Cranial Nerves: No cranial nerve deficit.     Sensory: No sensory deficit.     Motor: No weakness.     Gait: Gait normal.  Psychiatric:        Behavior: Behavior normal.     (all labs ordered are listed, but only abnormal results are displayed) Labs Reviewed  BASIC METABOLIC PANEL WITH GFR - Abnormal; Notable for the following components:      Result Value   Creatinine, Ser 1.11 (*)    All other components within normal limits  CBC - Abnormal; Notable for the following components:   Platelets 434 (*)    All other components within normal limits  HCG, SERUM, QUALITATIVE  TROPONIN T, HIGH SENSITIVITY    EKG: None  Radiology: DG Chest 2 View Result Date:  04/11/2024 CLINICAL DATA:  Reports migraines and chest pain. EXAM: CHEST - 2 VIEW COMPARISON:  November 13, 2023 FINDINGS: The heart size and mediastinal contours are within normal limits. Low lung volumes are noted. Both lungs are clear. Radiopaque surgical clips are seen within the right upper quadrant. The visualized skeletal structures are unremarkable. IMPRESSION: No active cardiopulmonary disease. Electronically Signed   By: Suzen Dials M.D.   On: 04/11/2024 17:50     Procedures   Medications Ordered in the ED  sodium chloride  0.9 % bolus 1,000 mL (1,000 mLs Intravenous New Bag/Given 04/11/24 1843)  HYDROmorphone  (DILAUDID ) injection 1 mg (1 mg Intravenous Given 04/11/24 1855)  metoCLOPramide  (REGLAN ) injection 10 mg (10 mg Intravenous Given 04/11/24 1852)  diphenhydrAMINE  (BENADRYL ) injection 25 mg (25 mg Intravenous Given 04/11/24 1849)  dexamethasone  (DECADRON ) injection 10 mg (10 mg Intravenous Given 04/11/24 1846)                                    Medical Decision Making Amount and/or Complexity of Data Reviewed External Data Reviewed: notes. Labs: ordered. Decision-making details documented in ED Course. Radiology: ordered and independent interpretation performed. Decision-making details documented in ED Course. ECG/medicine tests: ordered and independent interpretation performed. Decision-making details documented in ED Course.  Risk Prescription drug management.   Pt with typical migraine HA without sx suggestive of SAH(sudden onset, worst of life, or deficits), infection, or cavernous vein thrombosis.  Normal neuro exam and vital signs. Will give HA cocktail and re-eval. Patient does have a history of POTS and reports she feels that is being exacerbated because she has not been able to drink much today because of the nausea but does not have specific concerns for that today.  Her vital signs are normal here.  However labs were drawn prior to patient being bedded in her  room. I independently interpreted patient's labs and EKG.  EKG is normal sinus rhythm and a normal EKG, CBC, BMP are within normal limits.  Troponin was negative and hCG is negative.  I have independently visualized and interpreted pt's images today.  CXR wnl.   7:30 PM Pt feeling better after meds and ready to go home.      Final diagnoses:  Other migraine with status migrainosus, intractable    ED Discharge Orders     None          Doretha Folks, MD 04/11/24 1930  "

## 2024-04-11 NOTE — ED Notes (Signed)
 Pt. Woke this morning with migraine headache.  Pt. Reports nausea and vomiting with her normal meds not working.

## 2024-04-11 NOTE — Discharge Instructions (Addendum)
 All the blood work today was normal and your xray and EKG.  Continue your current medications and try to stay hydrated.

## 2024-04-12 ENCOUNTER — Encounter: Payer: Self-pay | Admitting: Family

## 2024-04-12 ENCOUNTER — Other Ambulatory Visit: Payer: Self-pay | Admitting: Family

## 2024-04-12 DIAGNOSIS — E538 Deficiency of other specified B group vitamins: Secondary | ICD-10-CM

## 2024-04-12 MED ORDER — FOLIC ACID 1 MG PO TABS: 2.0000 mg | ORAL_TABLET | Freq: Every day | ORAL | 6 refills | Status: AC

## 2024-04-16 ENCOUNTER — Telehealth: Payer: Self-pay | Admitting: Gastroenterology

## 2024-04-16 NOTE — Telephone Encounter (Signed)
 Left message for pt to call back

## 2024-04-16 NOTE — Telephone Encounter (Signed)
 Pt stated that she has multiple issues going on and was discussed with a different provider that she may have malabsorption issues. Pt was scheduled to see Deanna May NP on 04/26/2024 at 3:20 pm.  Pt verbalized understanding with all questions answered.

## 2024-04-17 ENCOUNTER — Ambulatory Visit (INDEPENDENT_AMBULATORY_CARE_PROVIDER_SITE_OTHER): Admitting: Family Medicine

## 2024-04-17 ENCOUNTER — Encounter: Payer: Self-pay | Admitting: Family Medicine

## 2024-04-17 VITALS — BP 93/65 | HR 88 | Temp 98.3°F | Resp 16 | Ht 66.0 in | Wt 189.6 lb

## 2024-04-17 DIAGNOSIS — K5900 Constipation, unspecified: Secondary | ICD-10-CM

## 2024-04-17 DIAGNOSIS — R442 Other hallucinations: Secondary | ICD-10-CM

## 2024-04-17 MED ORDER — FLUTICASONE PROPIONATE 50 MCG/ACT NA SUSP: 2.0000 | Freq: Every day | NASAL | 6 refills | Status: AC

## 2024-04-17 NOTE — Patient Instructions (Addendum)
 Stay hydrated.  Consider Metamucil or Benefiber daily.  Continue MiraLAX 1-2 times daily.   Try 2 tablespoons of milk of mag in 4 oz of warm prune juice. Do that and wait a couple hours. If no improvement, try a Dulcolax suppository and then let me know if we are still having issues.   Give this a shot a couple times and if not improving, let me know.   Let us  know if you need anything.

## 2024-04-17 NOTE — Progress Notes (Signed)
 Chief Complaint  Patient presents with   Constipation    Patient reports chronic constipation     Subjective: Patient is a 44 y.o. female here for constipation.  4 weeks of constipation. She is passing gas and stool. Stool is hard and small (rabbit turds). No new N/V, bleeding. She has abd pain in the lower quad. Stays hydrated. Diet stable, getting fiber in. Stress is high. Using MiraLAX, Colace.  She is on Mounjaro  15 mg weekly and reports compliance, no adverse effects.  She has been on this for over a year.  When the last few weeks, has had a sense of smells that are not there.  Had an MRI done in October that was unremarkable.  No trauma to her head, runny/stuffy nose, new medicines associated with this.   Past Medical History:  Diagnosis Date   Anxiety    Asthma    Depression    Diabetes (HCC)    GERD (gastroesophageal reflux disease)    History of chicken pox    History of migraine headaches    Migraines    Pituitary adenoma (HCC)    POTS (postural orthostatic tachycardia syndrome)    Seizures (HCC)     Objective: BP 93/65 (BP Location: Left Arm, Patient Position: Sitting, Cuff Size: Normal)   Pulse 88   Temp 98.3 F (36.8 C) (Oral)   Resp 16   Ht 5' 6 (1.676 m)   Wt 189 lb 9.6 oz (86 kg)   LMP 03/25/2024   SpO2 99%   BMI 30.60 kg/m  General: Awake, appears stated age Abd: BS+, S, mild ttp in lower quadrants, ND Nose: Nares are patent without rhinorrhea, no edema or obvious polyps. Mouth: MMM Heart: RRR Lungs: CTAB, no rales, wheezes or rhonchi. No accessory muscle use Psych: Age appropriate judgment and insight, normal affect and mood  Assessment and Plan: Constipation, unspecified constipation type  Phantosmia - Plan: fluticasone  (FLONASE ) 50 MCG/ACT nasal spray  Probably started with dietary changes around the holidays.  Stay hydrated.  Metamucil daily.  Continue MiraLAX as needed.  She will take 2 tablespoons of milk of magnesia and 4 ounces of warm  prune juice and see if she can facilitate a few bowel movements.  If still not having success in the next week or so, we will consider some blood work and possibly Linzess. Has had recent MRI showing no intracranial abnormalities.  She is taking ipratropium nasal spray 4 times daily, will add Flonase .  If no obvious improvement in next month or so, she will let me know and we will set her up with the ENT team. The patient voiced understanding and agreement to the plan.  I spent 30 minutes with the patient discussing the above plans in addition to reviewing her chart on the same date of visit.  Mabel Mt [place], DO 04/17/24  3:24 PM

## 2024-04-22 ENCOUNTER — Encounter: Admitting: Physical Therapy

## 2024-04-23 ENCOUNTER — Encounter (HOSPITAL_BASED_OUTPATIENT_CLINIC_OR_DEPARTMENT_OTHER): Payer: Self-pay | Admitting: Emergency Medicine

## 2024-04-23 ENCOUNTER — Emergency Department (HOSPITAL_BASED_OUTPATIENT_CLINIC_OR_DEPARTMENT_OTHER): Admission: EM | Admit: 2024-04-23 | Discharge: 2024-04-23 | Disposition: A | Discharge status: Home / Self Care

## 2024-04-23 ENCOUNTER — Other Ambulatory Visit: Payer: Self-pay

## 2024-04-23 DIAGNOSIS — E119 Type 2 diabetes mellitus without complications: Secondary | ICD-10-CM | POA: Insufficient documentation

## 2024-04-23 DIAGNOSIS — G43909 Migraine, unspecified, not intractable, without status migrainosus: Secondary | ICD-10-CM | POA: Insufficient documentation

## 2024-04-23 DIAGNOSIS — Z91010 Allergy to peanuts: Secondary | ICD-10-CM | POA: Insufficient documentation

## 2024-04-23 DIAGNOSIS — Z79899 Other long term (current) drug therapy: Secondary | ICD-10-CM | POA: Insufficient documentation

## 2024-04-23 LAB — COMPREHENSIVE METABOLIC PANEL WITH GFR
ALT: 6 U/L (ref 0–44)
AST: 14 U/L — ABNORMAL LOW (ref 15–41)
Albumin: 4.4 g/dL (ref 3.5–5.0)
Alkaline Phosphatase: 43 U/L (ref 38–126)
Anion gap: 12 (ref 5–15)
BUN: 14 mg/dL (ref 6–20)
CO2: 21 mmol/L — ABNORMAL LOW (ref 22–32)
Calcium: 9.3 mg/dL (ref 8.9–10.3)
Chloride: 107 mmol/L (ref 98–111)
Creatinine, Ser: 1.15 mg/dL — ABNORMAL HIGH (ref 0.44–1.00)
GFR, Estimated: 60 mL/min — ABNORMAL LOW
Glucose, Bld: 89 mg/dL (ref 70–99)
Potassium: 4.1 mmol/L (ref 3.5–5.1)
Sodium: 139 mmol/L (ref 135–145)
Total Bilirubin: 0.4 mg/dL (ref 0.0–1.2)
Total Protein: 7.2 g/dL (ref 6.5–8.1)

## 2024-04-23 LAB — CBC
HCT: 39.5 % (ref 36.0–46.0)
Hemoglobin: 13.5 g/dL (ref 12.0–15.0)
MCH: 30.3 pg (ref 26.0–34.0)
MCHC: 34.2 g/dL (ref 30.0–36.0)
MCV: 88.8 fL (ref 80.0–100.0)
Platelets: 312 10*3/uL (ref 150–400)
RBC: 4.45 MIL/uL (ref 3.87–5.11)
RDW: 12.4 % (ref 11.5–15.5)
WBC: 10.2 10*3/uL (ref 4.0–10.5)
nRBC: 0 % (ref 0.0–0.2)

## 2024-04-23 LAB — URINALYSIS, ROUTINE W REFLEX MICROSCOPIC
Bilirubin Urine: NEGATIVE
Glucose, UA: NEGATIVE mg/dL
Hgb urine dipstick: NEGATIVE
Ketones, ur: NEGATIVE mg/dL
Leukocytes,Ua: NEGATIVE
Nitrite: NEGATIVE
Protein, ur: NEGATIVE mg/dL
Specific Gravity, Urine: 1.025 (ref 1.005–1.030)
pH: 6.5 (ref 5.0–8.0)

## 2024-04-23 LAB — LIPASE, BLOOD: Lipase: 20 U/L (ref 11–51)

## 2024-04-23 LAB — PREGNANCY, URINE: Preg Test, Ur: NEGATIVE

## 2024-04-23 MED ORDER — METOCLOPRAMIDE HCL 5 MG/ML IJ SOLN
10.0000 mg | Freq: Once | INTRAMUSCULAR | Status: AC
  Administered 2024-04-23: 10 mg via INTRAVENOUS
  Filled 2024-04-23: qty 2

## 2024-04-23 MED ORDER — DIPHENHYDRAMINE HCL 50 MG/ML IJ SOLN
25.0000 mg | Freq: Once | INTRAMUSCULAR | Status: AC
  Administered 2024-04-23: 25 mg via INTRAVENOUS
  Filled 2024-04-23: qty 1

## 2024-04-23 MED ORDER — HYDROMORPHONE HCL 1 MG/ML IJ SOLN
0.5000 mg | Freq: Once | INTRAMUSCULAR | Status: AC
  Administered 2024-04-23: 0.5 mg via INTRAVENOUS
  Filled 2024-04-23: qty 1

## 2024-04-23 MED ORDER — SODIUM CHLORIDE 0.9 % IV BOLUS
1000.0000 mL | Freq: Once | INTRAVENOUS | Status: AC
  Administered 2024-04-23: 1000 mL via INTRAVENOUS

## 2024-04-23 MED ORDER — DEXAMETHASONE SOD PHOSPHATE PF 10 MG/ML IJ SOLN
10.0000 mg | Freq: Once | INTRAMUSCULAR | Status: AC
  Administered 2024-04-23: 10 mg via INTRAVENOUS
  Filled 2024-04-23: qty 1

## 2024-04-23 NOTE — ED Triage Notes (Signed)
 Pt c/o ongoing migraine since yesterday, home meds ineffective.  Emesis since yesterday.

## 2024-04-23 NOTE — Discharge Instructions (Addendum)
 It was a pleasure taking care of you today.  Based on your history and physical exam as well as your labs I feel you are safe for discharge.  Today your migraine improved with medications here in the emergency department.  Please make your primary care provider as well as your neurologist aware of your visit today and all findings.  Recommend follow-up with your neurologist for ongoing treatment of your migraines.  Recommend seeing your neurologist within the next 1 to 2 weeks as it does appear you have had multiple migraines or you have had to come to the emergency department over the last few weeks.  If you experience any of the following symptoms including but not limited to blurry vision, facial droop, slurred speech, unilateral weakness, issues about coordination, severe headache, or other concerning symptom please return to the emergency department or seek further medical care.  Please continue your outpatient migraine medications as prescribed.

## 2024-04-23 NOTE — ED Provider Notes (Signed)
 " [place] [facility] AT [facility] Provider Note   CSN: [id number] Arrival date & time: 04/23/24  1351     Patient presents with: Migraine   Amber [name] is a 44 y.o. female who presents to the emergency department with a chief complaint of migraine.  Patient states that she has a history of migraines and that her current symptoms are consistent with previous migraine episodes.  She describes it as a left-sided headache.  Denies visual disturbances or other neurologic deficit.  Denies issues with balance or coordination or extremity weakness.  Denies facial droop or slurred speech.  Patient states that she has benefited from migraine cocktails in the past primarily containing Benadryl , Decadron , Dilaudid , as well as Reglan .  The patient has a past medical history significant for pituitary adenoma, migraine, anxiety, depression, type 2 diabetes, palpitations, Ehlers-Danlos syndrome, dysautonomia, etc. Patient follows outpatient with neurology who manages her history of migraines.     Migraine       Prior to Admission medications  Medication Sig Start Date End Date Taking? Authorizing Provider  busPIRone  (BUSPAR ) 7.5 MG tablet Take 1 tab by mouth 3 times daily as needed for anxiety. 01/23/24   Frann Mabel Mt, DO  cabergoline  (DOSTINEX ) 0.5 MG tablet 2.5 mg weekly (Two tabs on Mondays, 1 tab on wednesdays and 2 tabs on Fridays) 01/09/23   Shamleffer, Ibtehal Jaralla, MD  cetirizine (ZYRTEC) 10 MG chewable tablet Chew 10 mg by mouth in the morning and at bedtime.    [provider]  Cholecalciferol (TRUE VITAMIN D3) 1.25 MG (50000 UT) capsule Take 1 capsule (50,000 Units total) by mouth daily. 10/11/23   Frann Mabel Mt, DO  Continuous Glucose Sensor (DEXCOM G7 SENSOR) MISC USE 1 SENSOR TOPICALLY EVERY 10 DAYS 12/25/23   Frann Mabel Mt, DO  cromolyn  (GASTROCROM ) 100 MG/5ML solution Take 5 mLs (100 mg total) by mouth 4 (four) times daily -   before meals and at bedtime. 10/10/23   Lorin Norris, MD  cyanocobalamin  (VITAMIN B12) 1000 MCG/ML injection Inject 1 mL (1,000 mcg total) into the muscle every 14 (fourteen) days. 03/04/24   Tonette Lauraine HERO, PA-C  dexlansoprazole  (DEXILANT ) 60 MG capsule Take 1 capsule (60 mg total) by mouth daily. 07/24/23   Cirigliano, Vito V, DO  EPINEPHrine  (EPIPEN  2-PAK) 0.3 mg/0.3 mL IJ SOAJ injection Inject 0.3 mg into the muscle as needed for anaphylaxis. 06/14/23   Frann Mabel Mt, DO  Erenumab -aooe (AIMOVIG ) 140 MG/ML SOAJ Inject 140 mg as directed subcutaneously every 4 weeks 03/05/24   Skeet Juliene SAUNDERS, DO  escitalopram  (LEXAPRO ) 20 MG tablet Take 1 tablet (20 mg total) by mouth daily. 01/23/24   Frann Mabel Mt, DO  famotidine  (PEPCID ) 20 MG tablet Take 20 mg by mouth 2 (two) times daily.    [provider]  fenofibrate  (TRICOR ) 145 MG tablet Take 1 tablet (145 mg total) by mouth daily. 03/04/24   Frann Mabel Mt, DO  fluticasone  (FLONASE ) 50 MCG/ACT nasal spray Place 2 sprays into both nostrils daily. 04/17/24   Frann Mabel Mt, DO  folic acid  (FOLVITE ) 1 MG tablet Take 2 tablets (2 mg total) by mouth daily. 04/12/24   Franchot Lauraine HERO, NP  glucose blood (ACCU-CHEK GUIDE TEST) test strip Use to Check Blood Su[place] 09/06/23   Frann Mabel Mt, DO  ipratropium (ATROVENT ) 0.06 % nasal spray Place 2 sprays into both nostrils 4 (four) times daily. 09/29/23   Lorin Norris, MD  Menaquinone-7 (VITAMIN K2) 100 MCG  CAPS Take 1 Dose by mouth daily.    [provider]  minoxidil  (LONITEN ) 2.5 MG tablet Take 2.5 mg by mouth daily. 03/06/24   [provider]  omalizumab  (XOLAIR ) 150 MG/ML prefilled syringe Inject 300 mg into the skin every 14 (fourteen) days. 04/02/24   Lorin Norris, MD  ondansetron  (ZOFRAN -ODT) 8 MG disintegrating tablet Take 1 tablet (8 mg total) by mouth every 8 (eight) hours as needed for nausea or vomiting. 09/06/23   Skeet Juliene SAUNDERS,  DO  QUEtiapine  (SEROQUEL ) 300 MG tablet Take 1 tablet (300 mg total) by mouth at bedtime. 03/04/24   Frann Mabel Mt, DO  Riboflavin 400 MG CAPS Take 400 mg by mouth daily.    [provider]  Rimegepant Sulfate (NURTEC) 75 MG TBDP Take 1 tablet (75 mg total) by mouth as needed (Daily as needed for a Migraine). Maximum 1 tablet in 24 hours.  Quantity 8. 03/05/24   Skeet Juliene SAUNDERS, DO  rosuvastatin  (CRESTOR ) 20 MG tablet Take 1 tablet (20 mg total) by mouth daily. 03/04/24   Frann Mabel Mt, DO  Syringe/Needle, Disp, 25G X 1 1 ML MISC 1 Dose by Does not apply route every 14 (fourteen) days. 08/31/23   Tonette Lauraine HERO, PA-C  tirzepatide  (MOUNJARO ) 15 MG/0.5ML Pen Inject 15 mg into the skin once a week. 03/04/24   Frann Mabel Mt, DO  topiramate  (TOPAMAX ) 100 MG tablet Take 1 tablet (100 mg total) by mouth at bedtime. 03/05/24   Skeet Juliene SAUNDERS, DO  traMADol  (ULTRAM ) 50 MG tablet TAKE ONE TABLET BY MOUTH EVERY 6 HOURS AS NEEDED 02/26/24   Skeet Juliene SAUNDERS, DO    Allergies: Bee venom, Chlorhexidine, Ibuprofen, Iodine, Peanut-containing drug products, Sesame seed (diagnostic), Shellfish allergy , Soybean-containing drug products, Sulfa antibiotics, Triptans, Lunesta  [eszopiclone ], Almond (diagnostic), Anacardium occidentale, Aspirin, Hazelnut (filbert), Nsaids, Tape, Walnut, and Wheat    Review of Systems  HENT:         Migraine    Updated Vital Signs BP 113/77   Pulse 80   Temp 98.2 F (36.8 C) (Oral)   Resp 16   Ht 5' 6 (1.676 m)   Wt 86.2 kg   LMP 03/25/2024   SpO2 100%   BMI 30.67 kg/m   Physical Exam Vitals and nursing note reviewed.  Constitutional:      General: She is awake. She is not in acute distress.    Appearance: Normal appearance. She is not ill-appearing, toxic-appearing or diaphoretic.  HENT:     Head: Normocephalic and atraumatic.  Eyes:     General: No visual field deficit or scleral icterus.    Extraocular Movements: Extraocular  movements intact.     Conjunctiva/sclera: Conjunctivae normal.     Pupils: Pupils are equal, round, and reactive to light.  Cardiovascular:     Rate and Rhythm: Normal rate and regular rhythm.  Pulmonary:     Effort: Pulmonary effort is normal. No respiratory distress.     Breath sounds: Normal breath sounds. No stridor. No wheezing, rhonchi or rales.  Musculoskeletal:        General: Normal range of motion.     Cervical back: Normal range of motion. No rigidity or tenderness.     Right lower leg: No edema.     Left lower leg: No edema.  Skin:    General: Skin is warm.     Capillary Refill: Capillary refill takes less than 2 seconds.  Neurological:     General: No focal deficit  present.     Mental Status: She is alert and oriented to person, place, and time.     GCS: GCS eye subscore is 4. GCS verbal subscore is 5. GCS motor subscore is 6.     Cranial Nerves: Cranial nerves 2-12 are intact. No dysarthria or facial asymmetry.     Sensory: Sensation is intact.     Motor: Motor function is intact.     Coordination: Coordination is intact.     Gait: Gait is intact.  Psychiatric:        Mood and Affect: Mood normal.        Behavior: Behavior normal. Behavior is cooperative.     (all labs ordered are listed, but only abnormal results are displayed) Labs Reviewed  COMPREHENSIVE METABOLIC PANEL WITH GFR - Abnormal; Notable for the following components:      Result Value   CO2 21 (*)    Creatinine, Ser 1.15 (*)    AST 14 (*)    GFR, Estimated 60 (*)    All other components within normal limits  URINALYSIS, ROUTINE W REFLEX MICROSCOPIC - Abnormal; Notable for the following components:   APPearance HAZY (*)    All other components within normal limits  LIPASE, BLOOD  CBC  PREGNANCY, URINE    EKG: None  Radiology: No results found.   Procedures   Medications Ordered in the ED  sodium chloride  0.9 % bolus 1,000 mL (0 mLs Intravenous Stopped 04/23/24 2018)  metoCLOPramide   (REGLAN ) injection 10 mg (10 mg Intravenous Given 04/23/24 1844)  diphenhydrAMINE  (BENADRYL ) injection 25 mg (25 mg Intravenous Given 04/23/24 1842)  HYDROmorphone  (DILAUDID ) injection 0.5 mg (0.5 mg Intravenous Given 04/23/24 1852)  dexamethasone  (DECADRON ) injection 10 mg (10 mg Intravenous Given 04/23/24 1846)                                    Medical Decision Making Amount and/or Complexity of Data Reviewed Labs: ordered.  Risk Prescription drug management.   Patient presents to the ED for concern of migraine, this involves an extensive number of treatment options, and is a complaint that carries with it a high risk of complications and morbidity.  The differential diagnosis includes migraine, cluster headache, headache, etc.   Co morbidities that complicate the patient evaluation  pituitary adenoma, migraine, anxiety, depression, type 2 diabetes, palpitations, Ehlers-Danlos syndrome, dysautonomia   Additional history obtained:  Reviewed previous charts where patient was recently seen for migraine on 119/26 as well as 04/11/2024, both times patient was able to be discharged in the emergency department, also reviewed MRI of brain from 12/26/2023 as well as previous CT scan of head on 05/11/2023 which were both unremarkable   Lab Tests:  I Ordered, and personally interpreted labs.  The pertinent results include: CBC largely unremarkable, CMP significant for bicarb of 21, creatinine 1.15 which appears to be baseline, urinalysis not consistent with infection, pregnancy negative   Medicines ordered and prescription drug management:  I ordered medication including Dilaudid , Decadron , Reglan , Benadryl , fluids for migraine Reevaluation of the patient after these medicines showed that the patient improved I have reviewed the patients home medicines and have made adjustments as needed   Test Considered:  CT head: Deferred at this time as patient states that her symptoms are consistent  with previous migraines, denies focal neurodeficit, neuroexam intact here in the emergency department today, patient improved with migraine cocktail   Critical  Interventions:  None   Problem List / ED Course:  44 year old female, vital signs stable, presents to the emergency department for chief complaint of migraine, history of similar and states that this feels the same, no red flag headache symptoms, not worst headache of life, no focal neurodeficit On physical exam patient is well-appearing intact neurologic examination Patient has benefited previously from migraine cocktail of fluids, Benadryl , Reglan , Dilaudid , I am okay with giving this today Reviewed previous imaging reports were patient had a negative MRI of her brain approximately 4 months ago and has also had a negative CT scan approximately 1 year ago, will hold off on further imaging at this time as patient states that this is consistent with previous migraines Will treat symptomatically and reassess On reassessment patient much improved, stating that her migraine feels much better, patient states that she feels clinically ready to go home at this time, continues to have no focal neurodeficit on examination Return precautions given Patient discharged Most likely diagnosis at this time is migraine, patient has good follow-up outpatient with neurology and also has home regimen for migraines consisting of Nurtec, Tylenol , topiramate , as well as other medications   Reevaluation:  After the interventions noted above, I reevaluated the patient and found that they have :improved   Social Determinants of Health:  None   Dispostion:  After consideration of the diagnostic results and the patients response to treatment, I feel that the patient would benefit from discharge and outpatient therapies described, follow-up with neurology as well as primary care provider.       Final diagnoses:  Migraine without status migrainosus,  not intractable, unspecified migraine type    ED Discharge Orders     None          Janetta Terrall F[place], [place] 04/23/24 2300  "

## 2024-04-25 ENCOUNTER — Inpatient Hospital Stay: Attending: Medical Oncology

## 2024-04-25 VITALS — BP 99/61 | HR 80 | Temp 98.1°F | Resp 18

## 2024-04-25 DIAGNOSIS — D509 Iron deficiency anemia, unspecified: Secondary | ICD-10-CM

## 2024-04-25 MED ORDER — IRON SUCROSE 300 MG IVPB - SIMPLE MED
300.0000 mg | Freq: Once | Status: AC
  Administered 2024-04-25: 300 mg via INTRAVENOUS
  Filled 2024-04-25: qty 300

## 2024-04-25 MED ORDER — SODIUM CHLORIDE 0.9 % IV SOLN: Freq: Once | INTRAVENOUS | Status: AC

## 2024-04-25 NOTE — Patient Instructions (Signed)
 Iron  Sucrose Injection What is this medication? IRON  SUCROSE (EYE ern SOO krose) treats low levels of iron  (iron  deficiency anemia) in people with kidney disease. Iron  is a mineral that plays an important role in making red blood cells, which carry oxygen from your lungs to the rest of your body. This medicine may be used for other purposes; ask your health care provider or pharmacist if you have questions. COMMON BRAND NAME(S): Venofer  What should I tell my care team before I take this medication? They need to know if you have any of these conditions: Anemia not caused by low iron  levels Heart disease High levels of iron  in the blood Kidney disease Liver disease An unusual or allergic reaction to iron , other medications, foods, dyes, or preservatives Pregnant or trying to get pregnant Breastfeeding How should I use this medication? This medication is infused into a vein. It is given by your care team in a hospital or clinic setting. Talk to your care team about the use of this medication in children. While it may be prescribed for children as young as 2 years for selected conditions, precautions do apply. Overdosage: If you think you have taken too much of this medicine contact a poison control center or emergency room at once. NOTE: This medicine is only for you. Do not share this medicine with others. What if I miss a dose? Keep appointments for follow-up doses. It is important not to miss your dose. Call your care team if you are unable to keep an appointment. What may interact with this medication? Do not take this medication with any of the following: Deferoxamine Dimercaprol Other iron  products This medication may also interact with the following: Chloramphenicol Deferasirox This list may not describe all possible interactions. Give your health care provider a list of all the medicines, herbs, non-prescription drugs, or dietary supplements you use. Also tell them if you smoke,  drink alcohol, or use illegal drugs. Some items may interact with your medicine. What should I watch for while using this medication? Your condition will be monitored carefully while you are receiving this medication. Tell your care team if your symptoms do not start to get better or if they get worse. You may need blood work done while you are taking this medication. Sometimes, when medications are infused into veins, a little can leak out of the vein and into the tissue around it. If this medication leaks, it can cause a brown or dark stain on the skin. This is not common. It may be permanent. If you feel pain or swelling during your infusion, tell your care team right away. They can stop the infusion and treat the area. You may need to eat more foods that contain iron . Talk to your care team. Foods that contain iron  include whole grains or cereals, dried fruits, beans, peas, leafy green vegetables, and organ meats (liver, kidney). What side effects may I notice from receiving this medication? Side effects that you should report to your care team as soon as possible: Allergic reactions--skin rash, itching, hives, swelling of the face, lips, tongue, or throat Low blood pressure--dizziness, feeling faint or lightheaded, blurry vision Painful swelling, warmth, or redness of the skin, brown or dark skin color at the infusion site Shortness of breath Side effects that usually do not require medical attention (report these to your care team if they continue or are bothersome): Flushing Headache Joint pain Muscle pain Nausea This list may not describe all possible side effects. Call your  doctor for medical advice about side effects. You may report side effects to FDA at 1-800-FDA-1088. Where should I keep my medication? This medication is given in a hospital or clinic. It will not be stored at home. NOTE: This sheet is a summary. It may not cover all possible information. If you have questions about  this medicine, talk to your doctor, pharmacist, or health care provider.  2025 Elsevier/Gold Standard (2024-01-24 00:00:00)

## 2024-04-26 ENCOUNTER — Encounter: Payer: Self-pay | Admitting: Gastroenterology

## 2024-04-26 ENCOUNTER — Ambulatory Visit: Admitting: Gastroenterology

## 2024-04-26 ENCOUNTER — Other Ambulatory Visit

## 2024-04-26 VITALS — BP 96/60 | HR 76 | Ht 66.0 in | Wt 193.0 lb

## 2024-04-26 DIAGNOSIS — D509 Iron deficiency anemia, unspecified: Secondary | ICD-10-CM

## 2024-04-26 DIAGNOSIS — Z800 Family history of malignant neoplasm of digestive organs: Secondary | ICD-10-CM

## 2024-04-26 DIAGNOSIS — R1319 Other dysphagia: Secondary | ICD-10-CM

## 2024-04-26 DIAGNOSIS — K219 Gastro-esophageal reflux disease without esophagitis: Secondary | ICD-10-CM

## 2024-04-26 DIAGNOSIS — E538 Deficiency of other specified B group vitamins: Secondary | ICD-10-CM

## 2024-04-26 DIAGNOSIS — D8940 Mast cell activation, unspecified: Secondary | ICD-10-CM

## 2024-04-26 MED ORDER — DEXLANSOPRAZOLE 60 MG PO CPDR: 60.0000 mg | DELAYED_RELEASE_CAPSULE | Freq: Every day | ORAL | 3 refills | Status: AC

## 2024-04-26 NOTE — Progress Notes (Signed)
 "  Chief Complaint: Follow-up Primary GI Doctor: Dr. San    GI History: 44 y.o. female with a history of pituitary microadenoma s/p resection then recurrence, asthma, diabetes (on Mounjaro ), migraines, anxiety/depression, obesity, cubital tunnel, follows in GI clinic for the following:   GERD.  Longstanding history of reflux starting at age 5. Characterized by HB, regurgitation. Worse with spicy foods. Previously treated with Zantac, and changed to Pepcid  in 2020.   EGD in high school for hematemesis in the setting of NSAIDs for pulled muscle. EGD n/f H pylori ulcer, treated w/ Abx. CCY in 2012 for GB stones.    FHx n/f father with CRC diagnosed in his 34's, with recurrence of stage IV metastatic disease at age 69. Brother with sclerosing mesenteritis. Sister with ?splenic mass requiring splenectomy.    Endoscopic history: -EGD (01/2019, Dr. San): Reflux changes in lower esophagus without intestinal metaplasia, no stricture, empiric dilation with 54 Fr Maloney, moderate Non-H. pylori gastritis.  Trialed high-dose PPI    Interval History Patient last seen in the GI office on 04/20/2023 by Dr. San for Dysphagia, iron  deficiency anemia, B12 deficiency.  Patient presents with several gastrointestinal issues.  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 She has concerns she Emilly Lavey have celiac disease and last EGD not accurate because she did not consume gluten at that time.  GERD, reoccurring esophageal dysphagia Patient taking Dexilant  60 mg po daily in the evening and famotidine  twice daily. Patient presents for reoccurring dysphagia with solids over the  last 1-2 months. She has had to regurgitate food  (jelly beans) back up or drink water to get it down. She has intermittent heartburn at night. She saw Dr. Tanda in past per Dr. Rennis recommendation, reports not interested in surgical interventions at this time for the GERD.  Constipation Patient reports she has had issues with constipation since she added gluten back into her diet. She has had a few episodes intermittently with BRBPR with wiping Patient has BM every 1-2 days.  She uses OTC stool softeners and Metamucil as needed which helps. She has also had a lot of bloating, abdominal discomfort, and nausea along with consuming gluten.  Patient on mounjaro  since 12/2022, lost 100lbs.  She does not feel this exacerbates the constipation issue.  Folate deficiency On oral folate   B 12 deficiency Receiving B 12 injections versus oral due to not being absorbed  Iron  deficiency anemia Started IV iron  infusions, received one yesterday  Wt Readings from Last 3 Encounters:  04/26/24 193 lb (87.5 kg)  04/23/24 190 lb (86.2 kg)  04/17/24 189 lb 9.6 oz (86 kg)    Past Medical History:  Diagnosis Date   Anxiety    Asthma    Depression    Diabetes (HCC)    GERD (gastroesophageal reflux disease)    History of chicken pox    History of migraine headaches    Migraines    Pituitary adenoma (HCC)    POTS (postural orthostatic tachycardia syndrome)    Seizures (HCC)     Past Surgical History:  Procedure Laterality Date   APPENDECTOMY     CHOLECYSTECTOMY     ESOPHAGEAL MANOMETRY N/A 07/12/2023   Procedure: MANOMETRY, ESOPHAGUS;  Surgeon:  Cirigliano, Sandor GAILS, DO;  Location: [facility];  Service: Gastroenterology;  Laterality: N/A;   PH IMPEDANCE STUDY N/A 07/12/2023   Procedure: IMPEDANCE PH STUDY, ESOPHAGUS;  Surgeon: San Sandor GAILS, DO;  Location: [facility];  Service: Gastroenterology;  Laterality: N/A;   Pituiary Tumor     Tumor Removal Pituitary    Current  Outpatient Medications  Medication Sig Dispense Refill   busPIRone  (BUSPAR ) 7.5 MG tablet Take 1 tab by mouth 3 times daily as needed for anxiety. 90 tablet 2   cabergoline  (DOSTINEX ) 0.5 MG tablet 2.5 mg weekly (Two tabs on Mondays, 1 tab on wednesdays and 2 tabs on Fridays) 65 tablet 3   cetirizine (ZYRTEC) 10 MG chewable tablet Chew 10 mg by mouth in the morning and at bedtime.     Cholecalciferol (TRUE VITAMIN D3) 1.25 MG (50000 UT) capsule Take 1 capsule (50,000 Units total) by mouth daily. 12 capsule 0   Continuous Glucose Sensor (DEXCOM G7 SENSOR) MISC USE 1 SENSOR TOPICALLY EVERY 10 DAYS 1 each 11   cromolyn  (GASTROCROM ) 100 MG/5ML solution Take 5 mLs (100 mg total) by mouth 4 (four) times daily -  before meals and at bedtime. 480 mL 12   cyanocobalamin  (VITAMIN B12) 1000 MCG/ML injection Inject 1 mL (1,000 mcg total) into the muscle every 14 (fourteen) days. 6 mL 4   EPINEPHrine  (EPIPEN  2-PAK) 0.3 mg/0.3 mL IJ SOAJ injection Inject 0.3 mg into the muscle as needed for anaphylaxis. 1 each 2   Erenumab -aooe (AIMOVIG ) 140 MG/ML SOAJ Inject 140 mg as directed subcutaneously every 4 weeks 1 mL 11   escitalopram  (LEXAPRO ) 20 MG tablet Take 1 tablet (20 mg total) by mouth daily. 90 tablet 2   famotidine  (PEPCID ) 20 MG tablet Take 20 mg by mouth 2 (two) times daily.     fenofibrate  (TRICOR ) 145 MG tablet Take 1 tablet (145 mg total) by mouth daily. 90 tablet 1   fluticasone  (FLONASE ) 50 MCG/ACT nasal spray Place 2 sprays into both nostrils daily. 16 g 6   folic acid  (FOLVITE ) 1 MG tablet Take 2 tablets (2 mg total) by mouth daily. 60 tablet 6   glucose blood (ACCU-CHEK GUIDE TEST) test strip Use to Check Blood Suagr 100 each 2   ipratropium (ATROVENT ) 0.06 % nasal spray Place 2 sprays into both nostrils 4 (four) times daily. 15 mL 12   Menaquinone-7 (VITAMIN K2) 100 MCG CAPS Take 1 Dose by mouth daily.     minoxidil  (LONITEN ) 2.5 MG tablet Take 2.5 mg by mouth daily.     omalizumab  (XOLAIR ) 150  MG/ML prefilled syringe Inject 300 mg into the skin every 14 (fourteen) days. 12 mL 3   ondansetron  (ZOFRAN -ODT) 8 MG disintegrating tablet Take 1 tablet (8 mg total) by mouth every 8 (eight) hours as needed for nausea or vomiting. 60 tablet 1   QUEtiapine  (SEROQUEL ) 300 MG tablet Take 1 tablet (300 mg total) by mouth at bedtime. 90 tablet 1   Riboflavin 400 MG CAPS Take 400 mg by mouth daily.     Rimegepant Sulfate (NURTEC) 75 MG TBDP Take 1 tablet (75 mg total) by mouth as needed (Daily as needed for a Migraine). Maximum 1 tablet in 24 hours.  Quantity 8. 8 tablet 11   rosuvastatin  (CRESTOR ) 20 MG tablet Take 1 tablet (20 mg total) by mouth daily. 90 tablet 1   Syringe/Needle, Disp, 25G X 1 1 ML MISC 1 Dose by Does not apply route every 14 (fourteen) days. 25 each  0   tirzepatide  (MOUNJARO ) 15 MG/0.5ML Pen Inject 15 mg into the skin once a week. 6 mL 1   topiramate  (TOPAMAX ) 100 MG tablet Take 1 tablet (100 mg total) by mouth at bedtime. 30 tablet 11   traMADol  (ULTRAM ) 50 MG tablet TAKE ONE TABLET BY MOUTH EVERY 6 HOURS AS NEEDED 30 tablet 2   dexlansoprazole  (DEXILANT ) 60 MG capsule Take 1 capsule (60 mg total) by mouth daily. 90 capsule 3   No current facility-administered medications for this visit.    Allergies as of 04/26/2024 - Review Complete 04/26/2024  Allergen Reaction Noted   Bee venom Anaphylaxis 04/17/2023   Chlorhexidine Hives, Itching, and Rash 03/23/2017   Ibuprofen Other (See Comments) 01/27/2016   Iodine Hives, Itching, and Rash 01/27/2016   Peanut-containing drug products Other (See Comments) 05/05/2023   Sesame seed (diagnostic) Anaphylaxis 05/05/2023   Shellfish allergy  Shortness Of Breath, Swelling, and Other (See Comments) 07/20/2018   Soybean-containing drug products Anaphylaxis 05/05/2023   Sulfa antibiotics Anaphylaxis 01/27/2016   Triptans Other (See Comments) 04/23/2021   Lunesta  [eszopiclone ] Nausea And Vomiting 08/27/2019   Almond (diagnostic) Other (See  Comments) 05/05/2023   Anacardium occidentale Other (See Comments) 05/05/2023   Aspirin Other (See Comments) 01/27/2016   Hazelnut (filbert) Other (See Comments) 05/05/2023   Nsaids Nausea And Vomiting 01/27/2016   Tape Rash 01/28/2016   Walnut Other (See Comments) 05/05/2023   Wheat Nausea And Vomiting 05/05/2023    Family History  Problem Relation Age of Onset   Asthma Mother    Diabetes Mother    Migraines Mother    Hyperlipidemia Mother    Bipolar disorder Mother    Irritable bowel syndrome Mother    Asthma Father    Diabetes Father    Colon cancer Father        dx at age 76   Hyperlipidemia Father    Hypertension Father    Migraines Sister    Bipolar disorder Brother    Other Neg Hx        pituitary disorder   Esophageal cancer Neg Hx     Review of Systems:    Constitutional: No weight loss, fever, chills, weakness or fatigue HEENT: Eyes: No change in vision               Ears, Nose, Throat:  No change in hearing or congestion Skin: No rash or itching Cardiovascular: No chest pain, chest pressure or palpitations   Respiratory: No SOB or cough Gastrointestinal: See HPI and otherwise negative Genitourinary: No dysuria or change in urinary frequency Neurological: No headache, dizziness or syncope Musculoskeletal: No new muscle or joint pain Hematologic: No bleeding or bruising Psychiatric: No history of depression or anxiety    Physical Exam:  Vital signs: BP 96/60   Pulse 76   Ht 5' 6 (1.676 m)   Wt 193 lb (87.5 kg)   LMP 03/25/2024   SpO2 97%   BMI 31.15 kg/m   Constitutional:   Pleasant  female appears to be in NAD, Well developed, Well nourished, alert and cooperative Eyes:   PEERL, EOMI. No icterus. Conjunctiva pink. Neck:  Supple Throat: Oral cavity and pharynx without inflammation, swelling or lesion.  Respiratory: Respirations even and unlabored. Lungs clear to auscultation bilaterally.   No wheezes, crackles, or rhonchi.  Cardiovascular:  Normal S1, S2. Regular rate and rhythm. No peripheral edema, cyanosis or pallor.  Gastrointestinal:  Soft, nondistended, nontender. No rebound or guarding. Normal bowel sounds. No appreciable masses or  hepatomegaly. Rectal:  Not performed.  Msk:  Symmetrical without gross deformities. Without edema, no deformity or joint abnormality.  Neurologic:  Alert and  oriented x4;  grossly normal neurologically.  Skin:   Dry and intact without significant lesions or rashes.  RELEVANT LABS AND IMAGING: CBC    Latest Ref Rng & Units 04/23/2024    2:15 PM 04/11/2024    5:33 PM 04/10/2024    2:18 PM  CBC  WBC 4.0 - 10.5 K/uL 10.2  9.7  6.4   Hemoglobin 12.0 - 15.0 g/dL 86.4  85.7  87.0   Hematocrit 36.0 - 46.0 % 39.5  41.6  37.6   Platelets 150 - 400 K/uL 312  434  377      CMP     Latest Ref Rng & Units 04/23/2024    2:15 PM 04/11/2024    5:33 PM 04/07/2024    5:01 PM  CMP  Glucose 70 - 99 mg/dL 89  88  84   BUN 6 - 20 mg/dL 14  16  22    Creatinine 0.44 - 1.00 mg/dL 8.84  8.88  8.85   Sodium 135 - 145 mmol/L 139  140  140   Potassium 3.5 - 5.1 mmol/L 4.1  4.0  4.0   Chloride 98 - 111 mmol/L 107  105  108   CO2 22 - 32 mmol/L 21  23  22    Calcium  8.9 - 10.3 mg/dL 9.3  9.8  9.3   Total Protein 6.5 - 8.1 g/dL 7.2     Total Bilirubin 0.0 - 1.2 mg/dL 0.4     Alkaline Phos 38 - 126 U/L 43     AST 15 - 41 U/L 14     ALT 0 - 44 U/L 6        Lab Results  Component Value Date   TSH 1.07 12/27/2023   Lab Results  Component Value Date   IRON  76 04/10/2024   TIBC 435 04/10/2024   FERRITIN 148 04/10/2024     07/21/2023 esophageal manometry Results from the Esophageal Manometry study were reviewed and were normal.   Results from the pH/impedance study (conducted off acid suppression therapy) were reviewed and notable for the following:   1.  Increased esophageal acid exposure with % time pH <4 of 6.2% (normal <4%).  DeMeester score 22.5 (normal <14.72) 2.  Esophageal acid exposure is abnormal  in supine position 3.  Overall reflux events are increased, predominantly acid and weekly acid reflux episodes 4.  Positive symptom correlation for belching with reflux events based on SAP   Impression: Overall, these results are consistent with significant supine/nocturnal gastroesophageal reflux with increased esophageal acid exposure.  05/30/23 EGD - Normal esophagus. Dilated with 17 mm Savary dilator. Biopsies were taken with a cold forceps for evaluation of eosinophilic esophagitis. - Z- line regular, 40 cm from the incisors. - Normal stomach. Biopsied. - Normal examined duodenum. Biopsied. BX: FINAL DIAGNOSIS       1. Surgical [P], duodenal biopsy :      DUODENAL MUCOSA WITH NORMAL VILLOUS ARCHITECTURE.      NO VILLOUS ATROPHY OR INCREASED INTRAEPITHELIAL LYMPHOCYTES.       2. Surgical [P], gastric antrum and gastric body :      GASTRIC ANTRAL AND OXYNTIC MUCOSA WITH REACTIVE GASTROPATHY.      NEGATIVE FOR HELICOBACTER PYLORI.       3. Surgical [P], distal esophagus :      UNREMARKABLE SQUAMOUS MUCOSA.  NEGATIVE FOR EOSINOPHILIC ESOPHAGITIS.       4. Surgical [P], proximal esophagus :      UNREMARKABLE SQUAMOUS MUCOSA.      NEGATIVE FOR EOSINOPHILIC ESOPHAGITIS.   05/30/23 colonoscopy , recall 5 years - The entire examined colon is normal. - Non- bleeding internal hemorrhoids. - The examined portion of the ileum was normal. - No specimens collected.   Assessment/Plan: Encounter Diagnoses  Name Primary?   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  Reports she started to consume  gluten again which exacerbated the above symptoms with concerns that she Brack Shaddock have celiac disease.  Patient reports when she did endoscopy last year she was not consuming any gluten and concerned it Sharita Bienaime be false negative.  Patient has been on a gluten diet for over 6 weeks and would like to schedule upper GI endoscopy with small bowel biopsy to rule out celiac.  I will also collect celiac markers as I do not see these have ever been done.  Dr. San can also do an empiric dilatation at that time for esophageal dysphagia would seem to help her on the last endoscopy.  Patient will continue current medications for GERD and constipation as they seem to be doing well.    1) Dysphagia, reoccurring, empirically dilated last year and helped.  - schedule EGD with dilatation. The risks and benefits of EGD with possible biopsies and esophageal dilation were discussed with the patient who agrees to proceed. - Cut food into small pieces, chew thoroughly, and drink plenty fluids with meals  --if no improvement Jaya Lapka consider barium esophagram    2) GERD - Well-controlled on current therapy, uses prn meds for breakthrough symptoms --refilled Dexlansoprazole  60mg  po daily --continue Pepcid  BID  - Evaluate for erosive esophagitis, LES laxity, hiatal hernia time of EGD as above --declines surgical interventions at this time   3) Iron  deficiency anemia 05/2023 EGD with duodenal biopsies unremarkable 05/2023 Colonoscopy normal  - Good response to IV iron  - Continue follow-up in the Hematology clinic   4) B12 deficiency - EGD with gastric biopsies unremarkable - on B12 injections - Continue follow-up in the Hematology clinic   5) Constipation, bloating, abdominal discomfort -TTG IgA, IgA - Scheduled EGD in [facility] with Dr. San while on gluten. The risks and benefits of EGD with possible biopsies and esophageal dilation were discussed with the patient who agrees to proceed. - Continue over-the-counter stool  softeners - Continue Metamucil  6.) Diabetes - Hold Mounjaro  1 week prior to EGD   7) MCAST diagnosis -continue mast cell stabilizers Cromolyn  - Continue antihistamines   8.) Family history of colon cancer Father diagnosed with CRC in his 76s, then recurrence of stage IV metastatic cancer at age 80 --recall colonoscopy 05/2028    Thank you for the courtesy of this consult. Please call me with any questions or concerns.   Carrie Usery, FNP-C [facility] 04/26/2024, 4:36 PM  Cc: Frann Mabel Mt, DO  "

## 2024-04-26 NOTE — Patient Instructions (Addendum)
 GERD Continue Dexlansoprazole  Continue famotidine  GERD diet  Dysphagia Pamphlet attached   Vitamin b 12 deficiency Continue b 12   iron  deficiency Continue iv iron  infusions  Your provider has requested that you go to the basement level for lab work before leaving today. Press B on the elevator. The lab is located at the first door on the left as you exit the elevator.   You have been scheduled for an endoscopy. Please follow written instructions given to you at your visit today.  If you use inhalers (even only as needed), please bring them with you on the day of your procedure.  If you take any of the following medications, they will need to be adjusted prior to your procedure:   DO NOT TAKE 7 DAYS PRIOR TO TEST- Trulicity (dulaglutide) Ozempic, Wegovy (semaglutide) Mounjaro , Zepbound  (tirzepatide ) Bydureon Bcise (exanatide extended release)  DO NOT TAKE 1 DAY PRIOR TO YOUR TEST Rybelsus (semaglutide) Adlyxin (lixisenatide) Victoza (liraglutide) Byetta (exanatide) ___________________________________________________________________________  Due to recent changes in healthcare laws, you may see the results of your imaging and laboratory studies on MyChart before your provider has had a chance to review them.  We understand that in some cases there may be results that are confusing or concerning to you. Not all laboratory results come back in the same time frame and the provider may be waiting for multiple results in order to interpret others.  Please give us  48 hours in order for your provider to thoroughly review all the results before contacting the office for clarification of your results.   _______________________________________________________  If your blood pressure at your visit was 140/90 or greater, please contact your primary care physician to follow up on this.  _______________________________________________________  If you are age 40 or older, your body mass  index should be between 23-30. Your Body mass index is 31.15 kg/m. If this is out of the aforementioned range listed, please consider follow up with your Primary Care Provider.  If you are age 71 or younger, your body mass index should be between 19-25. Your Body mass index is 31.15 kg/m. If this is out of the aformentioned range listed, please consider follow up with your Primary Care Provider.   ________________________________________________________  The [facility] providers would like to encourage you to use MYCHART to communicate with providers for non-urgent requests or questions.  Due to long hold times on the telephone, sending your provider a message by [facility] may be a faster and more efficient way to get a response.  Please allow 48 business hours for a response.  Please remember that this is for non-urgent requests.  _______________________________________________________  [facility] is using a team-based approach to care.  Your team is made up of your doctor and two to three APPS. Our APPS (Nurse Practitioners and Physician Assistants) work with your physician to ensure care continuity for you. They are fully qualified to address your health concerns and develop a treatment plan. They communicate directly with your gastroenterologist to care for you. Seeing the Advanced Practice Practitioners on your physician's team can help you by facilitating care more promptly, often allowing for earlier appointments, access to diagnostic testing, procedures, and other specialty referrals.   Thank you for trusting me with your gastrointestinal care. Deanna May, FNP-C

## 2024-04-30 ENCOUNTER — Ambulatory Visit: Admitting: Neurology

## 2024-05-01 ENCOUNTER — Encounter: Admitting: Physical Therapy

## 2024-05-02 ENCOUNTER — Inpatient Hospital Stay

## 2024-05-07 ENCOUNTER — Encounter: Admitting: Physical Therapy

## 2024-05-08 ENCOUNTER — Encounter: Admitting: Gastroenterology

## 2024-05-08 ENCOUNTER — Inpatient Hospital Stay

## 2024-05-13 ENCOUNTER — Ambulatory Visit: Admitting: Professional

## 2024-05-14 ENCOUNTER — Encounter: Admitting: Physical Therapy

## 2024-05-20 ENCOUNTER — Ambulatory Visit: Admitting: Family Medicine

## 2024-06-04 ENCOUNTER — Ambulatory Visit: Admitting: Professional

## 2024-06-05 ENCOUNTER — Inpatient Hospital Stay: Attending: Medical Oncology

## 2024-07-03 ENCOUNTER — Inpatient Hospital Stay: Attending: Medical Oncology

## 2024-07-31 ENCOUNTER — Inpatient Hospital Stay: Admitting: Family

## 2024-07-31 ENCOUNTER — Inpatient Hospital Stay: Attending: Medical Oncology

## 2024-07-31 ENCOUNTER — Inpatient Hospital Stay

## 2024-09-30 ENCOUNTER — Ambulatory Visit: Admitting: Internal Medicine
